# Patient Record
Sex: Male | Born: 1975
Health system: Southern US, Community
[De-identification: ages and names within clinical notes are randomized; demographics above are authoritative.]

## PROBLEM LIST (undated history)

## (undated) DIAGNOSIS — K449 Diaphragmatic hernia without obstruction or gangrene: Secondary | ICD-10-CM

## (undated) DIAGNOSIS — F419 Anxiety disorder, unspecified: Secondary | ICD-10-CM

## (undated) DIAGNOSIS — F329 Major depressive disorder, single episode, unspecified: Secondary | ICD-10-CM

## (undated) DIAGNOSIS — K219 Gastro-esophageal reflux disease without esophagitis: Secondary | ICD-10-CM

## (undated) DIAGNOSIS — D571 Sickle-cell disease without crisis: Secondary | ICD-10-CM

## (undated) DIAGNOSIS — A630 Anogenital (venereal) warts: Secondary | ICD-10-CM

## (undated) DIAGNOSIS — B20 Human immunodeficiency virus [HIV] disease: Secondary | ICD-10-CM

## (undated) DIAGNOSIS — F32A Depression, unspecified: Secondary | ICD-10-CM

## (undated) DIAGNOSIS — K648 Other hemorrhoids: Secondary | ICD-10-CM

## (undated) DIAGNOSIS — K635 Polyp of colon: Secondary | ICD-10-CM

## (undated) DIAGNOSIS — R51 Headache: Secondary | ICD-10-CM

## (undated) DIAGNOSIS — Z21 Asymptomatic human immunodeficiency virus [HIV] infection status: Secondary | ICD-10-CM

## (undated) DIAGNOSIS — M1990 Unspecified osteoarthritis, unspecified site: Secondary | ICD-10-CM

## (undated) DIAGNOSIS — I10 Essential (primary) hypertension: Secondary | ICD-10-CM

## (undated) DIAGNOSIS — D759 Disease of blood and blood-forming organs, unspecified: Secondary | ICD-10-CM

## (undated) DIAGNOSIS — K5900 Constipation, unspecified: Secondary | ICD-10-CM

## (undated) DIAGNOSIS — G8929 Other chronic pain: Secondary | ICD-10-CM

## (undated) DIAGNOSIS — K259 Gastric ulcer, unspecified as acute or chronic, without hemorrhage or perforation: Secondary | ICD-10-CM

## (undated) DIAGNOSIS — M549 Dorsalgia, unspecified: Secondary | ICD-10-CM

## (undated) HISTORY — DX: Anogenital (venereal) warts: A63.0

## (undated) HISTORY — DX: Sickle-cell disease without crisis: D57.1

## (undated) HISTORY — DX: Polyp of colon: K63.5

## (undated) HISTORY — DX: Human immunodeficiency virus (HIV) disease: B20

## (undated) HISTORY — DX: Other hemorrhoids: K64.8

## (undated) HISTORY — DX: Constipation, unspecified: K59.00

## (undated) HISTORY — PX: WISDOM TOOTH EXTRACTION: SHX21

## (undated) HISTORY — DX: Gastro-esophageal reflux disease without esophagitis: K21.9

## (undated) HISTORY — DX: Diaphragmatic hernia without obstruction or gangrene: K44.9

## (undated) HISTORY — DX: Gastric ulcer, unspecified as acute or chronic, without hemorrhage or perforation: K25.9

## (undated) HISTORY — DX: Asymptomatic human immunodeficiency virus (hiv) infection status: Z21

---

## 1997-11-28 ENCOUNTER — Emergency Department (HOSPITAL_COMMUNITY): Admission: EM | Admit: 1997-11-28 | Discharge: 1997-11-28 | Payer: Self-pay | Admitting: Emergency Medicine

## 2002-04-17 ENCOUNTER — Emergency Department (HOSPITAL_COMMUNITY): Admission: EM | Admit: 2002-04-17 | Discharge: 2002-04-17 | Payer: Self-pay | Admitting: Emergency Medicine

## 2002-04-19 ENCOUNTER — Emergency Department (HOSPITAL_COMMUNITY): Admission: EM | Admit: 2002-04-19 | Discharge: 2002-04-19 | Payer: Self-pay | Admitting: Emergency Medicine

## 2002-04-21 ENCOUNTER — Emergency Department (HOSPITAL_COMMUNITY): Admission: EM | Admit: 2002-04-21 | Discharge: 2002-04-21 | Payer: Self-pay | Admitting: Emergency Medicine

## 2002-06-20 ENCOUNTER — Emergency Department (HOSPITAL_COMMUNITY): Admission: EM | Admit: 2002-06-20 | Discharge: 2002-06-20 | Payer: Self-pay | Admitting: Emergency Medicine

## 2003-04-04 ENCOUNTER — Emergency Department (HOSPITAL_COMMUNITY): Admission: EM | Admit: 2003-04-04 | Discharge: 2003-04-04 | Payer: Self-pay | Admitting: Emergency Medicine

## 2004-04-14 ENCOUNTER — Emergency Department (HOSPITAL_COMMUNITY): Admission: EM | Admit: 2004-04-14 | Discharge: 2004-04-14 | Payer: Self-pay | Admitting: Emergency Medicine

## 2004-05-01 ENCOUNTER — Emergency Department (HOSPITAL_COMMUNITY): Admission: EM | Admit: 2004-05-01 | Discharge: 2004-05-01 | Payer: Self-pay | Admitting: Emergency Medicine

## 2004-06-01 ENCOUNTER — Emergency Department (HOSPITAL_COMMUNITY): Admission: EM | Admit: 2004-06-01 | Discharge: 2004-06-01 | Payer: Self-pay | Admitting: Emergency Medicine

## 2004-08-12 ENCOUNTER — Emergency Department (HOSPITAL_COMMUNITY): Admission: EM | Admit: 2004-08-12 | Discharge: 2004-08-12 | Payer: Self-pay | Admitting: Emergency Medicine

## 2004-12-03 ENCOUNTER — Emergency Department (HOSPITAL_COMMUNITY): Admission: EM | Admit: 2004-12-03 | Discharge: 2004-12-03 | Payer: Self-pay | Admitting: Emergency Medicine

## 2005-06-05 ENCOUNTER — Emergency Department (HOSPITAL_COMMUNITY): Admission: EM | Admit: 2005-06-05 | Discharge: 2005-06-05 | Payer: Self-pay | Admitting: Emergency Medicine

## 2005-07-06 ENCOUNTER — Emergency Department (HOSPITAL_COMMUNITY): Admission: EM | Admit: 2005-07-06 | Discharge: 2005-07-06 | Payer: Self-pay | Admitting: Emergency Medicine

## 2005-12-26 ENCOUNTER — Emergency Department (HOSPITAL_COMMUNITY): Admission: EM | Admit: 2005-12-26 | Discharge: 2005-12-26 | Payer: Self-pay | Admitting: Emergency Medicine

## 2006-05-10 ENCOUNTER — Emergency Department (HOSPITAL_COMMUNITY): Admission: EM | Admit: 2006-05-10 | Discharge: 2006-05-10 | Payer: Self-pay | Admitting: Emergency Medicine

## 2006-08-11 ENCOUNTER — Emergency Department (HOSPITAL_COMMUNITY): Admission: EM | Admit: 2006-08-11 | Discharge: 2006-08-12 | Payer: Self-pay | Admitting: Emergency Medicine

## 2007-07-31 ENCOUNTER — Emergency Department (HOSPITAL_COMMUNITY): Admission: EM | Admit: 2007-07-31 | Discharge: 2007-08-01 | Payer: Self-pay | Admitting: Emergency Medicine

## 2007-08-04 ENCOUNTER — Ambulatory Visit: Payer: Self-pay | Admitting: Gastroenterology

## 2007-08-08 ENCOUNTER — Ambulatory Visit: Payer: Self-pay | Admitting: Gastroenterology

## 2007-10-10 ENCOUNTER — Ambulatory Visit: Payer: Self-pay | Admitting: Gastroenterology

## 2007-12-28 ENCOUNTER — Emergency Department (HOSPITAL_COMMUNITY): Admission: EM | Admit: 2007-12-28 | Discharge: 2007-12-28 | Payer: Self-pay | Admitting: Emergency Medicine

## 2007-12-30 ENCOUNTER — Emergency Department (HOSPITAL_COMMUNITY): Admission: EM | Admit: 2007-12-30 | Discharge: 2007-12-30 | Payer: Self-pay | Admitting: Emergency Medicine

## 2008-10-05 ENCOUNTER — Emergency Department (HOSPITAL_COMMUNITY): Admission: EM | Admit: 2008-10-05 | Discharge: 2008-10-06 | Payer: Self-pay | Admitting: Emergency Medicine

## 2010-02-06 ENCOUNTER — Encounter: Payer: Self-pay | Admitting: Adult Health

## 2010-02-06 LAB — CONVERTED CEMR LAB
CD4 Count: 279 microliters
CD4 T Helper %: 14.7 %
Platelets: 154 10*3/uL
WBC: 6.6 10*3/uL

## 2010-04-16 DIAGNOSIS — B20 Human immunodeficiency virus [HIV] disease: Secondary | ICD-10-CM

## 2010-05-04 ENCOUNTER — Encounter (INDEPENDENT_AMBULATORY_CARE_PROVIDER_SITE_OTHER): Payer: Self-pay | Admitting: *Deleted

## 2010-05-06 ENCOUNTER — Encounter: Payer: Self-pay | Admitting: Adult Health

## 2010-05-06 ENCOUNTER — Ambulatory Visit: Payer: Self-pay | Admitting: Internal Medicine

## 2010-05-06 DIAGNOSIS — M76899 Other specified enthesopathies of unspecified lower limb, excluding foot: Secondary | ICD-10-CM | POA: Insufficient documentation

## 2010-05-06 DIAGNOSIS — F341 Dysthymic disorder: Secondary | ICD-10-CM

## 2010-05-06 LAB — CONVERTED CEMR LAB
ALT: 14 units/L (ref 0–53)
AST: 23 units/L (ref 0–37)
Albumin: 4.3 g/dL (ref 3.5–5.2)
Alkaline Phosphatase: 71 units/L (ref 39–117)
BUN: 10 mg/dL (ref 6–23)
Basophils Absolute: 0 10*3/uL (ref 0.0–0.1)
Basophils Relative: 0 % (ref 0–1)
Bilirubin Urine: NEGATIVE
CO2: 25 meq/L (ref 19–32)
Calcium: 9.4 mg/dL (ref 8.4–10.5)
Chlamydia, Swab/Urine, PCR: NEGATIVE
Chloride: 103 meq/L (ref 96–112)
Cholesterol: 169 mg/dL (ref 0–200)
Creatinine, Ser: 1.16 mg/dL (ref 0.40–1.50)
Eosinophils Absolute: 0.1 10*3/uL (ref 0.0–0.7)
Eosinophils Relative: 1 % (ref 0–5)
GC Probe Amp, Urine: NEGATIVE
Glucose, Bld: 74 mg/dL (ref 70–99)
HCT: 43.9 % (ref 39.0–52.0)
HCV Ab: NEGATIVE
HDL: 37 mg/dL — ABNORMAL LOW (ref >39)
HIV 1 RNA Quant: 34900 copies/mL — ABNORMAL HIGH (ref <20)
HIV-1 RNA Quant, Log: 4.54 — ABNORMAL HIGH (ref <1.30)
HIV-1 antibody: POSITIVE — AB
HIV-2 Ab: NEGATIVE
Hemoglobin, Urine: NEGATIVE
Hemoglobin: 14.8 g/dL (ref 13.0–17.0)
Hep A Total Ab: NEGATIVE
Hep B Core Total Ab: NEGATIVE
Hep B S Ab: NEGATIVE
Hepatitis B Surface Ag: NEGATIVE
Ketones, ur: NEGATIVE mg/dL
LDL Cholesterol: 116 mg/dL — ABNORMAL HIGH (ref 0–99)
Leukocytes, UA: NEGATIVE
Lymphocytes Relative: 32 % (ref 12–46)
Lymphs Abs: 2.2 10*3/uL (ref 0.7–4.0)
MCHC: 33.7 g/dL (ref 30.0–36.0)
MCV: 88.7 fL (ref 78.0–100.0)
Monocytes Absolute: 0.7 10*3/uL (ref 0.1–1.0)
Monocytes Relative: 10 % (ref 3–12)
Neutro Abs: 3.9 10*3/uL (ref 1.7–7.7)
Neutrophils Relative %: 57 % (ref 43–77)
Nitrite: NEGATIVE
Platelets: 152 10*3/uL (ref 150–400)
Potassium: 4.1 meq/L (ref 3.5–5.3)
Protein, ur: NEGATIVE mg/dL
RBC: 4.95 M/uL (ref 4.22–5.81)
RDW: 13.7 % (ref 11.5–15.5)
Sodium: 135 meq/L (ref 135–145)
Specific Gravity, Urine: 1.02 (ref 1.005–1.030)
Total Bilirubin: 0.4 mg/dL (ref 0.3–1.2)
Total CHOL/HDL Ratio: 4.6
Total Protein: 9 g/dL — ABNORMAL HIGH (ref 6.0–8.3)
Triglycerides: 82 mg/dL (ref <150)
Urine Glucose: NEGATIVE mg/dL
Urobilinogen, UA: 0.2 (ref 0.0–1.0)
VLDL: 16 mg/dL (ref 0–40)
WBC: 6.9 10*3/uL (ref 4.0–10.5)
pH: 7.5 (ref 5.0–8.0)

## 2010-05-11 ENCOUNTER — Ambulatory Visit: Payer: Self-pay | Admitting: Adult Health

## 2010-05-25 ENCOUNTER — Ambulatory Visit: Payer: Self-pay | Admitting: Adult Health

## 2010-05-25 ENCOUNTER — Encounter (INDEPENDENT_AMBULATORY_CARE_PROVIDER_SITE_OTHER): Payer: Self-pay | Admitting: *Deleted

## 2010-05-26 ENCOUNTER — Encounter (INDEPENDENT_AMBULATORY_CARE_PROVIDER_SITE_OTHER): Payer: Self-pay | Admitting: *Deleted

## 2010-06-01 ENCOUNTER — Encounter (INDEPENDENT_AMBULATORY_CARE_PROVIDER_SITE_OTHER): Payer: Self-pay | Admitting: *Deleted

## 2010-06-02 ENCOUNTER — Encounter (INDEPENDENT_AMBULATORY_CARE_PROVIDER_SITE_OTHER): Payer: Self-pay | Admitting: *Deleted

## 2010-06-02 ENCOUNTER — Ambulatory Visit: Payer: Self-pay | Admitting: Adult Health

## 2010-06-04 ENCOUNTER — Encounter (INDEPENDENT_AMBULATORY_CARE_PROVIDER_SITE_OTHER): Payer: Self-pay | Admitting: *Deleted

## 2010-06-09 ENCOUNTER — Encounter (INDEPENDENT_AMBULATORY_CARE_PROVIDER_SITE_OTHER): Payer: Self-pay | Admitting: *Deleted

## 2010-06-16 ENCOUNTER — Encounter (INDEPENDENT_AMBULATORY_CARE_PROVIDER_SITE_OTHER): Payer: Self-pay | Admitting: *Deleted

## 2010-06-17 ENCOUNTER — Encounter: Payer: Self-pay | Admitting: Adult Health

## 2010-06-25 ENCOUNTER — Encounter: Payer: Self-pay | Admitting: Adult Health

## 2010-07-06 ENCOUNTER — Ambulatory Visit: Admit: 2010-07-06 | Payer: Self-pay | Admitting: Adult Health

## 2010-07-06 ENCOUNTER — Ambulatory Visit
Admission: RE | Admit: 2010-07-06 | Discharge: 2010-07-06 | Payer: Self-pay | Source: Home / Self Care | Attending: Adult Health | Admitting: Adult Health

## 2010-07-06 ENCOUNTER — Encounter: Payer: Self-pay | Admitting: Adult Health

## 2010-07-06 DIAGNOSIS — J069 Acute upper respiratory infection, unspecified: Secondary | ICD-10-CM | POA: Insufficient documentation

## 2010-07-06 DIAGNOSIS — N529 Male erectile dysfunction, unspecified: Secondary | ICD-10-CM | POA: Insufficient documentation

## 2010-07-06 DIAGNOSIS — B370 Candidal stomatitis: Secondary | ICD-10-CM | POA: Insufficient documentation

## 2010-07-06 DIAGNOSIS — B009 Herpesviral infection, unspecified: Secondary | ICD-10-CM | POA: Insufficient documentation

## 2010-07-06 LAB — CONVERTED CEMR LAB
ALT: 18 units/L (ref 0–53)
AST: 30 units/L (ref 0–37)
Albumin: 4.4 g/dL (ref 3.5–5.2)
Alkaline Phosphatase: 75 units/L (ref 39–117)
BUN: 10 mg/dL (ref 6–23)
Basophils Absolute: 0 10*3/uL (ref 0.0–0.1)
Basophils Relative: 0 % (ref 0–1)
CO2: 26 meq/L (ref 19–32)
Calcium: 9.7 mg/dL (ref 8.4–10.5)
Chloride: 102 meq/L (ref 96–112)
Creatinine, Ser: 1.18 mg/dL (ref 0.40–1.50)
Eosinophils Absolute: 0.1 10*3/uL (ref 0.0–0.7)
Eosinophils Relative: 1 % (ref 0–5)
Glucose, Bld: 95 mg/dL (ref 70–99)
HCT: 43.8 % (ref 39.0–52.0)
HIV 1 RNA Quant: 48 copies/mL — ABNORMAL HIGH (ref <20)
HIV-1 RNA Quant, Log: 1.68 — ABNORMAL HIGH (ref <1.30)
Hemoglobin: 14.8 g/dL (ref 13.0–17.0)
Lymphocytes Relative: 31 % (ref 12–46)
Lymphs Abs: 1.8 10*3/uL (ref 0.7–4.0)
MCHC: 33.8 g/dL (ref 30.0–36.0)
MCV: 88.1 fL (ref 78.0–100.0)
Monocytes Absolute: 0.5 10*3/uL (ref 0.1–1.0)
Monocytes Relative: 9 % (ref 3–12)
Neutro Abs: 3.4 10*3/uL (ref 1.7–7.7)
Neutrophils Relative %: 59 % (ref 43–77)
Platelets: 151 10*3/uL (ref 150–400)
Potassium: 3.9 meq/L (ref 3.5–5.3)
RBC: 4.97 M/uL (ref 4.22–5.81)
RDW: 14.2 % (ref 11.5–15.5)
Sodium: 136 meq/L (ref 135–145)
Total Bilirubin: 0.4 mg/dL (ref 0.3–1.2)
Total Protein: 8.8 g/dL — ABNORMAL HIGH (ref 6.0–8.3)
WBC: 5.8 10*3/uL (ref 4.0–10.5)

## 2010-07-07 ENCOUNTER — Encounter (INDEPENDENT_AMBULATORY_CARE_PROVIDER_SITE_OTHER): Payer: Self-pay | Admitting: *Deleted

## 2010-07-13 LAB — T-HELPER CELL (CD4) - (RCID CLINIC ONLY)
CD4 % Helper T Cell: 19 % — ABNORMAL LOW (ref 33–55)
CD4 T Cell Abs: 420 uL (ref 400–2700)

## 2010-07-20 ENCOUNTER — Encounter (INDEPENDENT_AMBULATORY_CARE_PROVIDER_SITE_OTHER): Payer: Self-pay | Admitting: *Deleted

## 2010-07-20 ENCOUNTER — Ambulatory Visit
Admission: RE | Admit: 2010-07-20 | Discharge: 2010-07-20 | Payer: Self-pay | Source: Home / Self Care | Attending: Adult Health | Admitting: Adult Health

## 2010-07-30 ENCOUNTER — Encounter (INDEPENDENT_AMBULATORY_CARE_PROVIDER_SITE_OTHER): Payer: Self-pay | Admitting: *Deleted

## 2010-07-30 NOTE — Assessment & Plan Note (Signed)
Summary: COUGHING/VS   Primary Provider:  [name] NP  CC:  COUGH.  History of Present Illness: 1 week hx of sinus congestion and cough which has since improved with Nyquil and Alka-selzer Cold and Flu, but since recovery has had coated, painful tongue making difficult to chew or taste food.  Also recurrence of "rash" to sacral area of back.  Pruritic and a slightly painful.  Also having "bump" on penis.  Requesting something for ED.   Preventive Screening-Counseling & Management  Alcohol-Tobacco     Alcohol drinks/day: daily      Alcohol type: beer     Smoking Status: current     Packs/Day: 0.5  Safety-Violence-Falls     Seat Belt Use: yes   Current Allergies (reviewed today): No known allergies  Review of Systems       The patient complains of genital sores and suspicious skin lesions.  The patient denies anorexia, fever, weight loss, weight gain, vision loss, decreased hearing, hoarseness, chest pain, syncope, dyspnea on exertion, peripheral edema, prolonged cough, headaches, hemoptysis, abdominal pain, melena, hematochezia, severe indigestion/heartburn, hematuria, incontinence, muscle weakness, transient blindness, difficulty walking, depression, unusual weight change, abnormal bleeding, enlarged lymph nodes, and angioedema.         See HPI.    Vital Signs:  Patient profile:   35 year old male Height:      68 inches (172.72 cm) Weight:      207.0 pounds (94.09 kg) BMI:     31.59 Temp:     98.4 degrees F (36.89 degrees C) oral Pulse rate:   88 / minute BP sitting:   118 / 77  (right arm)  Vitals Entered By: Baxter Hire) (July 06, 2010 3:28 PM) CC: COUGH Pain Assessment Patient in pain? no      Nutritional Status BMI of > 30 = obese Nutritional Status Detail APPETITE IS NORMAL PER PATIENT  Does patient need assistance? Functional Status Self care Ambulation Normal   Physical Exam  General:  Well-developed,well-nourished,in no acute  distress; alert,appropriate and cooperative throughout examination Head:  Normocephalic and atraumatic without obvious abnormalities. No apparent alopecia or balding. Ears:  External ear exam shows no significant lesions or deformities.  Otoscopic examination reveals clear canals, tympanic membranes are intact bilaterally without bulging, retraction, inflammation or discharge. Hearing is grossly normal bilaterally. Nose:  External nasal examination shows no deformity or inflammation. Nasal mucosa are pink and moist without lesions or exudates. Mouth:    white plaque(s) and glossitis.   Neck:  No deformities, masses, or tenderness noted. Lungs:  Normal respiratory effort, chest expands symmetrically. Lungs are clear to auscultation, no crackles or wheezes. Heart:  Normal rate and regular rhythm. S1 and S2 normal without gallop, murmur, click, rub or other extra sounds. Genitalia:  Small umbelicated lesion dorsal surface of penis. Msk:  No deformity or scoliosis noted of thoracic or lumbar spine.   Neurologic:  alert & oriented X3, cranial nerves II-XII intact, strength normal in all extremities, and gait normal.   Skin:  1 cm round grouping of fluid-filled vessicles located at mid sacral region, no extension into S1 or S2 dermatome distribution noted Psych:  Cognition and judgment appear intact. Alert and cooperative with normal attention span and concentration. No apparent delusions, illusions, hallucinations   Impression & Recommendations:  Problem # 1:  CANDIDIASIS, ORAL (ICD-112.0)  fluconazole 100mg  by mouth once daily x 5 days.  Orders: Est. 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 Zoster, but subjectively has had similar intermittently.  Will try Valtrex 1 gm by mouth q12h x 7 days.  If symptoms worsen, to contact clinic.  Orders: Est. Patient Level IV (29562)  Problem # 3:  URI (ICD-465.9)  Resolving spontaneously, most  likely viral.  Recommended Mucinex-DM per package instructions.  To call if symptoms worsen again.  Orders: Est. Patient Level IV (13086)  Problem # 4:  IMPOTENCE OF ORGANIC ORIGIN (ICD-607.84)  Gave trial course of cialis.  Instructed on drug safety and interactions. Informed him ED may be associated with Lexapro therapy.  Informed him of free trial offer provided by company, otherwise, he is financially responsible for obtaining med. His updated medication list for this problem includes:    Cialis 5 Mg Tabs (Tadalafil) .Marland Kitchen... 1 tab by mouth once daily as directed.  Orders: Est. Patient Level IV (57846)  Problem # 5:  HIV INFECTION (ICD-042)  To obtain staging labs today and f/u on 07/21/2010. His updated medication list for this problem includes:    Fluconazole 100 Mg Tabs (Fluconazole) .Marland Kitchen... 1 tab by mouth once daily x 5 days    Valtrex 1 Gm Tabs (Valacyclovir hcl) .Marland Kitchen... 1 tab by mouth every 12 hours for 7 days  Orders: Est. Patient Level IV (96295)  Medications Added to Medication List This Visit: 1)  Fluconazole 100 Mg Tabs (Fluconazole) .Marland Kitchen.. 1 tab by mouth once daily x 5 days 2)  Valtrex 1 Gm Tabs (Valacyclovir hcl) .Marland Kitchen.. 1 tab by mouth every 12 hours for 7 days 3)  Cialis 5 Mg Tabs (Tadalafil) .Marland Kitchen.. 1 tab by mouth once daily as directed. Prescriptions: CIALIS 5 MG TABS (TADALAFIL) 1 tab by mouth once daily as directed.  #30 x 0   Entered and Authorized by:   [name] NP   Signed by:   [name] NP on 07/06/2010   Method used:   Print then Give to Patient   RxID:   [id number] VALTREX 1 GM TABS (VALACYCLOVIR HCL) 1 tab by mouth every 12 hours for 7 days  #14 x 0   Entered and Authorized by:   [name] NP   Signed by:   [name] NP on 07/06/2010   Method used:   Print then Give to Patient   RxID:   [id number] FLUCONAZOLE 100 MG TABS (FLUCONAZOLE) 1 tab by mouth once daily x 5 days  #5 x 0   Entered and Authorized by:    [name] NP   Signed by:   [name] NP on 07/06/2010   Method used:   Print then Give to Patient   RxID:   [id number]

## 2010-07-30 NOTE — Miscellaneous (Signed)
Summary: Lexapro samples received   Clinical Lists Changes  Medications: Rx of LEXAPRO 20 MG TABS (ESCITALOPRAM OXALATE) 1/2 tab by mouth once daily;  #100 x 0;  Signed;  Entered by: Janyth Contes;  Authorized by: [name] NP;  Method used: Samples Given    Prescriptions: LEXAPRO 20 MG TABS (ESCITALOPRAM OXALATE) 1/2 tab by mouth once daily  #100 x 0   Entered by:   Tomasita Morrow RN   Authorized by:   [name] NP   Signed by:   Tomasita Morrow RN on 06/17/2010   Method used:   Samples Given   RxID:   [id number]  Customer by P.O. # [id number] Tomasita Morrow RN  June 17, 2010 5:39 PM

## 2010-07-30 NOTE — Miscellaneous (Signed)
Summary: ADAP Update  Clinical Lists Changes  Observations: Added new observation of AIDSDAP: Yes 2011 (06/04/2010 11:40)

## 2010-07-30 NOTE — Miscellaneous (Signed)
Summary: Bridge Counselor  Clinical Lists Changes BC transported pt to [facility] today for hs scheduled appointments with mental health counselor and Traci Sermon. Pt then met with THP for ongoing case management as pt has other long terms goals that he wishes to pursure. BC will close pt's file.

## 2010-07-30 NOTE — Consult Note (Signed)
Summary: New Pt. Referral : [facility] 02/17/10  New Pt. Referral : [facility] 02/17/10   Imported By: Florinda Marker 06/25/2010 15:57:18  _____________________________________________________________________  External Attachment:    Type:   Image     Comment:   External Document

## 2010-07-30 NOTE — Miscellaneous (Signed)
Summary: HIPAA Restrictions  HIPAA Restrictions   Imported By: Florinda Marker 05/11/2010 14:53:41  _____________________________________________________________________  External Attachment:    Type:   Image     Comment:   External Document

## 2010-07-30 NOTE — Miscellaneous (Signed)
Summary: Bridge Counselor referral  Clinical Lists Changes  Problems: Added new problem of HIV INFECTION (ICD-042) Orders: Added new Referral order of Misc. Referral (Misc. Ref) - Signed

## 2010-07-30 NOTE — Miscellaneous (Signed)
Summary: Bridge Counselor  Clinical Lists Changes BC called the patient assistance program for pt's Lexapro today. Pt was approved on 05/27/10 and his medication should be arriving in the next two weeks. Please be on the lookout for it so that we can let pt know as soon as it is here and available for pick up. Thanks!

## 2010-07-30 NOTE — Assessment & Plan Note (Signed)
Summary: 1wk f/u [mkj]   Primary Shawn Brooks:  Shawn Chad NP  CC:  follow up.  History of Present Illness: Took last 20 mg tab Lexapro today.  Has no refills.  Emotionally stable but not "quite there" yet, according to him.  No physical complaints voiced.  Ready to begin ARV therapy.  Preventive Screening-Counseling & Management  Alcohol-Tobacco     Smoking Status: current   Current Allergies: No known allergies  Review of Systems  The patient denies anorexia, fever, weight loss, weight gain, vision loss, decreased hearing, hoarseness, chest pain, syncope, dyspnea on exertion, peripheral edema, prolonged cough, headaches, hemoptysis, abdominal pain, melena, hematochezia, severe indigestion/heartburn, hematuria, incontinence, genital sores, muscle weakness, suspicious skin lesions, transient blindness, difficulty walking, depression, unusual weight change, abnormal bleeding, enlarged lymph nodes, angioedema, breast masses, and testicular masses.    Vital Signs:  Patient profile:   35 year old male Height:      68 inches Weight:      201.50 pounds BMI:     30.75 Temp:     97.7 degrees F oral Pulse rate:   71 / minute BP sitting:   113 / 73  (left arm) Cuff size:   large  Vitals Entered By: Jimmy Footman, CMA (June 02, 2010 10:03 AM) CC: follow up Is Patient Diabetic? No   Physical Exam  General:  LIMITED EXAMalert, well-developed, well-nourished, well-hydrated, appropriate dress, normal appearance, healthy-appearing, cooperative to examination, and good hygiene.   Mouth:  Oral mucosa and oropharynx without lesions or exudates.  Teeth in good repair. Lungs:  Normal respiratory effort, chest expands symmetrically. Lungs are clear to auscultation, no crackles or wheezes. Heart:  Normal rate and regular rhythm. S1 and S2 normal without gallop, murmur, click, rub or other extra sounds. Abdomen:  Bowel sounds positive,abdomen soft and non-tender without masses, organomegaly  or hernias noted.   Impression & Recommendations:  Problem # 1:  ANXIETY DEPRESSION (ICD-300.4)  Stable.  Referred to CCN for assistance with Lexapro Rx until PAP in place, which should be in the next two weeks.  Per CCN request, we will provide Rx refills at 1-week intervals until PAP is started.  Orders: Est. Patient Level IV (29562)  Problem # 2:  HIV INFECTION (ICD-042)  Spent >20 minutes discussing HIV pathology and treatment regimens.  Discussion of regimens extensive including SE's and long-term toxicities.  We jointly agreed upon Isentress and Truvada for an initial regimen.  He acknowledged how to take medications and verbally described his understanding of treatment SE's.  39-month supply dispensed pending completion of ADAP application process.  He is to return in 4 weeks for labs with follow-up in 5-6 weeks.  Orders: Est. Patient Level IV (99214)Future Orders: T-CBC w/Diff ([id number]) ... 06/30/2010 T-CD4SP ([facility]) (CD4SP) ... 06/30/2010 T-Comprehensive Metabolic Panel [phone]) ... 06/30/2010 T-HIV Viral Load [phone]) ... 06/30/2010  Medications Added to Medication List This Visit: 1)  Isentress 400 Mg Tabs (Raltegravir potassium) .... Take one (1) tablet every twelve (12) hours 2)  Truvada 200-300 Mg Tabs (Emtricitabine-tenofovir) .... Take one (1) tablet once a day  Patient Instructions: 1)  Take medications as prescribed. 2)  Follow-up with Eligibility office for ADAP application 3)  Follow-up with Selena Batten in [facility] for Lexapro Rx. 4)  Return for labs in 4 weeks 5)  Schedule follow-up appointment in 5-6 weeks. Prescriptions: TRUVADA 200-300 MG TABS (EMTRICITABINE-TENOFOVIR) Take one (1) tablet once a day  #30 x 0   Entered and Authorized by:   Malvin Johns  Sundra Aland NP   Signed by:   Shawn Chad NP on 06/02/2010   Method used:   Print then Give to Patient   RxID:   [id number] ISENTRESS 400 MG TABS (RALTEGRAVIR POTASSIUM) Take one (1) tablet every  twelve (12) hours  #60 x 0   Entered and Authorized by:   Shawn Chad NP   Signed by:   Shawn Chad NP on 06/02/2010   Method used:   Print then Give to Patient   RxID:   [id number]

## 2010-07-30 NOTE — Consult Note (Signed)
Summary: Progress Notes: TST  Progress Notes: TST   Imported By: Florinda Marker 06/25/2010 15:54:26  _____________________________________________________________________  External Attachment:    Type:   Image     Comment:   External Document

## 2010-07-30 NOTE — Miscellaneous (Signed)
Summary: RW AND ADAP UPDATES  Clinical Lists Changes  Observations: Added new observation of AIDSDAP: Pending (06/02/2010 13:05) Added new observation of YEARLYEXPEN: 3108  (06/02/2010 13:05)  Appended Document: RW AND ADAP UPDATES PATIENT APPLYING FOR ADAP - SENDING APP TODAY, 06/02/10

## 2010-07-30 NOTE — Assessment & Plan Note (Signed)
Summary: 1WK F/U [MKJ]   CC:  feeling "somewhat better" and Depression.  History of Present Illness: Has been on Lexapro for pas week with some improvement in emotional lability.  Denies any SI or pre-morbid thoughts at present.  Sleep patterns improving.  Denies any known sequela of HIV in past.  Historically been healthy and well upuntil current HIV diagnosis.  No physical complaints voiced presently.  Depression History:      The patient denies a depressed mood most of the day but notes a diminished interest in his usual daily activities.        The patient denies that he feels like life is not worth living, denies that he wishes that he were dead, and denies that he has thought about ending his life.        Preventive Screening-Counseling & Management  Alcohol-Tobacco     Alcohol drinks/day: daily      Alcohol type: beer     Smoking Status: never     Packs/Day: 0.5  Caffeine-Diet-Exercise     Caffeine use/day: none     Does Patient Exercise: no  Hep-HIV-STD-Contraception     HIV Risk: risk noted  Safety-Violence-Falls     Seat Belt Use: yes      Sexual History:  same sex encounters and currently monogomous.        Blood Transfusions:  no.    Past History:  Past Medical History: Unremarkable  Social History: Occupation: unemployed Designer, multimedia Single Never Smoked Alcohol use-yes Drug use-yes Regular exercise-no Education:  Radio producer Situation:  lives with partner Pets:  no Sexually Active:  yes Sexual Orientation:  homosexual Sexual History:  same sex encounters, currently monogomous Blood Transfusions:  no Ethnicity:  Black Occupation:  None Marital Status:  Divorced Religion:  Christian Protestant Alcohol:  1 per day Tobacco Use:  Never  Additional History Condom Use:  occasional Hx of STD:  no Tattoos:  no School Level:  college  Review of Systems General:  Complains of sleep disorder; denies chills, fatigue, fever, loss of appetite,  malaise, sweats, weakness, and weight loss. Eyes:  Denies blurring, discharge, double vision, eye irritation, eye pain, halos, itching, light sensitivity, red eye, vision loss-1 eye, and vision loss-both eyes. ENT:  Denies decreased hearing, difficulty swallowing, ear discharge, earache, hoarseness, nasal congestion, nosebleeds, postnasal drainage, ringing in ears, sinus pressure, and sore throat. CV:  Denies bluish discoloration of lips or nails, chest pain or discomfort, difficulty breathing at night, difficulty breathing while lying down, fainting, fatigue, leg cramps with exertion, lightheadness, near fainting, palpitations, shortness of breath with exertion, swelling of feet, swelling of hands, and weight gain. Resp:  Denies chest discomfort, chest pain with inspiration, cough, coughing up blood, excessive snoring, hypersomnolence, morning headaches, pleuritic, shortness of breath, sputum productive, and wheezing. GI:  Denies abdominal pain, bloody stools, change in bowel habits, constipation, dark tarry stools, diarrhea, excessive appetite, gas, hemorrhoids, indigestion, loss of appetite, nausea, vomiting, vomiting blood, and yellowish skin color. GU:  Denies decreased libido, discharge, dysuria, erectile dysfunction, genital sores, hematuria, incontinence, nocturia, urinary frequency, and urinary hesitancy. MS:  Denies joint pain, joint redness, joint swelling, loss of strength, low back pain, mid back pain, muscle aches, muscle , cramps, muscle weakness, stiffness, and thoracic pain. Derm:  Denies changes in color of skin, changes in nail beds, dryness, excessive perspiration, flushing, hair loss, insect bite(s), itching, lesion(s), poor wound healing, and rash. Neuro:  Denies brief paralysis, difficulty with concentration, disturbances in coordination, falling down,  headaches, inability to speak, memory loss, numbness, poor balance, seizures, sensation of room spinning, tingling, tremors, visual  disturbances, and weakness. Psych:  Complains of anxiety, depression, and irritability; denies alternate hallucination ( auditory/visual), easily angered, easily tearful, mental problems, panic attacks, sense of great danger, suicidal thoughts/plans, thoughts of violence, unusual visions or sounds, and thoughts /plans of harming others. Endo:  Denies cold intolerance, excessive hunger, excessive thirst, excessive urination, heat intolerance, polyuria, and weight change. Heme:  Denies abnormal bruising, bleeding, enlarge lymph nodes, fevers, pallor, and skin discoloration. Allergy:  Denies hives or rash, itching eyes, persistent infections, seasonal allergies, and sneezing.  Vital Signs:  Patient profile:   35 year old male Height:      69 inches Weight:      200.12 pounds BMI:     29.66 BSA:     2.07 Temp:     98.5 degrees F oral Pulse rate:   65 / minute BP sitting:   116 / 75  (right arm)  Vitals Entered By: Starleen Arms CMA (May 25, 2010 9:29 AM) CC: feeling "somewhat better", Depression Is Patient Diabetic? No Pain Assessment Patient in pain? no      Nutritional Status BMI of 25 - 29 = overweight Nutritional Status Detail normal  Have you ever been in a relationship where you felt threatened, hurt or afraid?No  Domestic Violence Intervention none  Does patient need assistance? Functional Status Self care Ambulation Normal   Physical Exam  General:  Well-developed,well-nourished,in no acute distress; alert,appropriate and cooperative throughout examination Head:  normocephalic, atraumatic, no abnormalities observed, no abnormalities palpated, and diffuse alopecia.  normocephalic, atraumatic, no abnormalities observed, and no abnormalities palpated.   Eyes:  No corneal or conjunctival inflammation noted. EOMI. Perrla. Funduscopic exam benign, without hemorrhages, exudates or papilledema. Vision grossly normal. Ears:  External ear exam shows no significant lesions  or deformities.  Otoscopic examination reveals clear canals, tympanic membranes are intact bilaterally without bulging, retraction, inflammation or discharge. Hearing is grossly normal bilaterally. Nose:  External nasal examination shows no deformity or inflammation. Nasal mucosa are pink and moist without lesions or exudates. Mouth:  Oral mucosa and oropharynx without lesions or exudates.  Teeth in good repair. Neck:  No deformities, masses, or tenderness noted. Chest Wall:  No deformities, masses, tenderness or gynecomastia noted. Lungs:  Normal respiratory effort, chest expands symmetrically. Lungs are clear to auscultation, no crackles or wheezes. Heart:  Normal rate and regular rhythm. S1 and S2 normal without gallop, murmur, click, rub or other extra sounds. Abdomen:  Bowel sounds positive,abdomen soft and non-tender without masses, organomegaly or hernias noted. Rectal:  No external abnormalities noted. Normal sphincter tone. No rectal masses or tenderness. Genitalia:  Testes bilaterally descended without nodularity, tenderness or masses. No scrotal masses or lesions. No penis lesions or urethral discharge. Prostate:  deferred Msk:  No deformity or scoliosis noted of thoracic or lumbar spine.   Pulses:  R and L carotid,radial,femoral,dorsalis pedis and posterior tibial pulses are full and equal bilaterally Extremities:  No clubbing, cyanosis, edema, or deformity noted with normal full range of motion of all joints.   Neurologic:  No cranial nerve deficits noted. Station and gait are normal. Plantar reflexes are down-going bilaterally. DTRs are symmetrical throughout. Sensory, motor and coordinative functions appear intact. Skin:  Intact without suspicious lesions or rashes Cervical Nodes:  Shoddy A&P LN's Axillary Nodes:  Shoddy sm LN's Inguinal Nodes:  Shoddy LN's Psych:  Oriented X3, memory intact for recent and remote, normally  interactive, not anxious appearing, not agitated, flat  affect, and poor eye contact.  Oriented X3, memory intact for recent and remote, normally interactive, not anxious appearing, not agitated, flat affect, and poor eye contact.     Impression & Recommendations:  Problem # 1:  HIV INFECTION (ICD-042)  CD4 250 @ 13% with HIV RNA @ 34,900 copies/ml per PCR.  Satisfies CDC surveillance definition of AIDS with CD4% <14%.  Treatment warranted but we would like to see better adjustment with current therapy for depression.  We will defer treatment discussion until a 1-week follow-up to assess readiness at that time.  He acknowledged and agreed with this plan.  He is to go to Eligibility office and begin RW and ADAP application process.  Orders: Est. Patient Level IV (78469)  Problem # 2:  ANXIETY DEPRESSION (ICD-300.4)  Some improvement on 10 mg Lexapro.  Will increase dose to standard 20mg  today and check response in 1 week.  Orders: Est. Patient Level IV (62952)  Patient Instructions: 1)  Schedule follow-up appointment in 1 week. 2)  Increase Lexapro to 1 20 mg tablet by mouth daily.

## 2010-07-30 NOTE — Miscellaneous (Signed)
Summary: Bridge Counselor  Clinical Lists Changes BC transorted pt to [facility] today to see Alisa. Pt in a really good mood today! Pt reports no missed doses of his HIV medications and he has his ADAP approval letter already. BC also called the PAP for pt's Lexapro and was told that it was shipped today. BC assisted pt with another weeks supply of his Lexapro until his 90 day supply comes in. Please call pt once his medications arrive at the clinic so he can pick them up!

## 2010-07-30 NOTE — Miscellaneous (Signed)
Summary: Bridge Counselor  Clinical Lists Changes Pt's medications from PAP arrived today. Pt now has a 100 day supply of his Lexapro 20mg . BC will deliver pt's medication today. Pt is aware of how and when to take them and will call the clinic with any questions or concerns.

## 2010-07-30 NOTE — Miscellaneous (Signed)
Summary: Bridge Counselor  Clinical Lists Changes  05/26/10-BC has been working with client since 04/22/10.  BC has transported client and had THP to assist client with getting Lexapro (RW funds) due to some depression about dx and other life situations such as unemployment, lack of transportation, father's death, living situtaiton (live with partner), etc.  Client appeared to be in good spirits during transport to his scheduled medical appt 11/28.  Client was suppose to meet with Denyce Robert, Family Service counselor 11/28 but rescheduled for 12/06 @ 11. Client has a doctor's appt with Traci Sermon, NP 12/06 @ 10:00.  Client received his RW card from [facility].  Client will get his orange card and ADAP complete once he brings in a notarized letter of support from his partner as it relates to his living arrangements and proof of unemployment before July 2011(client will bring these requested documents in 12/06).  Client will work with Sharol Roussel, Paramedic after current [employer] leaves [employer]. Client is aware to contact Sharol Roussel after 05/27/10.

## 2010-07-30 NOTE — Assessment & Plan Note (Signed)
Summary: 6wk f/u [mkj]   Primary Provider:  [name] NP  CC:  follow-up visit, labs, and Depression.  History of Present Illness: Depression relapsed over past two weeks with increasing periods of crying, low energy, despondancy, anhedonia, anger.  Episodes when he feels he wish he was dead, has thought of hurting self, but now he does not feel this is an option.  Recent birthday no one from his immediate family called him which increased his feelings of isolation and anger more.  Sleep patterns disturbed: always tired, not sleeping well.  Expressed concerns that his Lexapro is no longer working.  Takes med at bedtime.  Admits adherence to other meds without missed doses.  Depression History:      Positive alarm features for depression include impaired concentration (indecisiveness).        Suicide risk questions reveal that he wishes that he were dead and he has thought about ending his life.  The patient denies that he feels like life is not worth living and denies that he has planned how to end his life.  Due to his current symptoms, it often takes extra effort to do the things he needs to do.        Comments:  doesnt feel like lexapro is working.   Preventive Screening-Counseling & Management  Alcohol-Tobacco     Alcohol drinks/day: <1     Alcohol type: beer     Smoking Status: current     Packs/Day: 0.5  Caffeine-Diet-Exercise     Caffeine use/day: none     Does Patient Exercise: no   Current Allergies (reviewed today): No known allergies  Review of Systems General:  Complains of malaise and sleep disorder; denies chills, fatigue, fever, loss of appetite, sweats, weakness, and weight loss. Eyes:  Denies blurring, double vision, and eye pain. ENT:  Denies decreased hearing, difficulty swallowing, ear discharge, earache, postnasal drainage, ringing in ears, and sinus pressure. CV:  Denies chest pain or discomfort, difficulty breathing at night, difficulty breathing  while lying down, fainting, fatigue, leg cramps with exertion, and lightheadness. Resp:  Complains of sputum productive; denies chest pain with inspiration, cough, coughing up blood, hypersomnolence, and shortness of breath. MS:  Denies joint pain, joint redness, joint swelling, and loss of strength. Derm:  Denies changes in color of skin, changes in nail beds, dryness, excessive perspiration, flushing, hair loss, insect bite(s), itching, lesion(s), poor wound healing, and rash. Neuro:  Complains of difficulty with concentration; denies disturbances in coordination, falling down, headaches, inability to speak, memory loss, numbness, poor balance, seizures, sensation of room spinning, tingling, tremors, and visual disturbances. Psych:  Complains of anxiety, depression, easily angered, easily tearful, irritability, and suicidal thoughts/plans; denies alternate hallucination ( auditory/visual), panic attacks, sense of great danger, thoughts of violence, unusual visions or sounds, and thoughts /plans of harming others.  Vital Signs:  Patient profile:   35 year old male Height:      68 inches Weight:      204.75 pounds BMI:     31.24 Temp:     97.6 degrees F oral Pulse rate:   69 / minute BP sitting:   143 / 81  (right arm)  Vitals Entered By: Alesia Morin CMA (July 20, 2010 10:38 AM) CC: follow-up visit, labs, Depression Is Patient Diabetic? No Pain Assessment Patient in pain? no      Nutritional Status BMI of > 30 = obese Nutritional Status Detail appetite "good"  Have you ever been in a relationship  where you felt threatened, hurt or afraid?No   Does patient need assistance? Functional Status Self care Ambulation Normal   Physical Exam  General:  alert, well-developed, and well-nourished.   Head:  normocephalic and atraumatic.   Eyes:  vision grossly intact, pupils equal, pupils round, and pupils reactive to light.   Ears:  R ear normal and L ear normal.   Nose:  no external  deformity, no external erythema, and no nasal discharge.   Mouth:  no gingival abnormalities, no dental plaque, pharynx pink and moist, and fair dentition.   Neck:  No deformities, masses, or tenderness noted. Lungs:  Normal respiratory effort, chest expands symmetrically. Lungs are clear to auscultation, no crackles or wheezes. Heart:  Normal rate and regular rhythm. S1 and S2 normal without gallop, murmur, click, rub or other extra sounds. Abdomen:  Bowel sounds positive,abdomen soft and non-tender without masses, organomegaly or hernias noted. Msk:  No deformity or scoliosis noted of thoracic or lumbar spine.   Extremities:  No clubbing, cyanosis, edema, or deformity noted with normal full range of motion of all joints.   Neurologic:  No cranial nerve deficits noted. Station and gait are normal.  Sensory, motor and coordinative functions appear intact. Skin:  Sacral HSV lesions clear Cervical Nodes:  Shoddy A&P LN's Axillary Nodes:  Shoddy sm LN's Psych:  Oriented X3, memory intact for recent and remote, normally interactive, subdued, poor eye contact, and angry.     Impression & Recommendations:  Problem # 1:  HIV INFECTION (ICD-042)  CD4 on 07/06/2010 420 @ 19%  which is markedly up from nadir of 250 @ 13% on 05/06/2010.  HIV RNA 48 copies/ml down nearly log 3 from baseline of 34,900.  Good response after 1 month treatment on Isentress and Truvada.  Will continue present management and repeat labs in 6 weeks with a follow-up in 8 weeks.  If stability persists after this we will extend evaluations to every 3 months thereafter.  Orders: Est. Patient Level IV (99214)Future Orders: T-CBC w/Diff ([id number]) ... 08/31/2010 T-CD4SP ([facility]) (CD4SP) ... 08/31/2010 T-Comprehensive Metabolic Panel [phone]) ... 08/31/2010 T-HIV Viral Load [phone]) ... 08/31/2010 T-Lipid Profile [phone]) ... 08/31/2010  Problem # 2:  ANXIETY DEPRESSION (ICD-300.4)  Relapse may very well be  situational as symptoms re-developed during the time of his birthday.  Although he has re-entertained thoughts of harming hinmself a discussion with Mental Health counselor confirmed he is currently not a danger to himself or others. While this relapse seems to stem from family issues, it is relative to a timeline of life events.  After spending >20 minutes discussing his encouraging lab repoirts and the significant strides he has made, his mood and disposition appeared more positive.  We emphasized the cause and effect benefit of HIV treatment and how he has improved as a measure to have better outlook.  We offered a referral to psychiatry if he felt current regimen is not adequately controlling mood. We opted to continued mental health counseling and monitoring of symptoms.  Agreed to contact crisis numbers should he again feel like harming himself.  As he is having sleep pattern disturbances, we recommend him taki ng his Lexapro in the morning, instead of at bedtime and monitor his response.  We will further monitor this matter in concert with Mental Health Svcs.with whom he has a f/u in 1 week.  Orders: Est. Patient Level IV (16010)  Problem # 3:  HSV (ICD-054.9) Assessment: Improved     Prevention &  Chronic Care Immunizations   Influenza vaccine: Historical  (02/06/2010)    Tetanus booster: 02/06/2010: Historical    Pneumococcal vaccine: Historical  (02/06/2010)  Other Screening   Smoking status: current  (07/20/2010)  Lipids   Total Cholesterol: 169  (05/06/2010)   LDL: 116  (05/06/2010)   LDL Direct: Not documented   HDL: 37  (05/06/2010)   Triglycerides: 82  (05/06/2010)   Immunization History:  Influenza Immunization History:    Influenza:  historical (02/06/2010)  Not Administered:    Influenza Vaccine not given

## 2010-07-30 NOTE — Miscellaneous (Signed)
Summary: Bridge Counselor  Clinical Lists Changes BC transported pt to [facility] today to obtain his flucanozole, valtrex and cialis. BC discussed with pt how to take each medication. Pt expressed understanding and will call the clinic if he experiences any problems.

## 2010-07-30 NOTE — Miscellaneous (Signed)
Summary: Bridge Counselor  Clinical Lists Changes BC (through [facility] funds) was able to assist pt with a 7 day supply of Lexapro. BC will monitor pt's need on a weekly basis for assistance until his patient assistance medications arrive. BC also provided pt with a pillbox and treatment adherence for his HIV medications and watched pt fill his pillbox while still in the clinic. Pt is doing better today reporting "more good days than bad." BC will continue to work with pt through his next follow up appointment in six weeks to make sure that he has ongoing access to all of his medications.

## 2010-07-30 NOTE — Miscellaneous (Signed)
Summary: RW Financial Update   Clinical Lists Changes  Observations: Added new observation of RWPARTICIP: Yes (05/25/2010 12:08) Added new observation of RWTITLE: B (05/25/2010 12:08) Added new observation of AIDSDAP: No (05/25/2010 12:08) Added new observation of PCTFPL: 67.70  (05/25/2010 12:08) Added new observation of INCOMESOURCE: unemployment  (05/25/2010 12:08) Added new observation of HOUSEINCOME: 7332  (05/25/2010 12:08) Added new observation of #CHILD<18 IN: 0  (05/25/2010 12:08) Added new observation of FAMILYSIZE: 1  (05/25/2010 12:08) Added new observation of HOUSING: Stable/permanent  (05/25/2010 12:08) Added new observation of FINASSESSDT: 05/25/2010  (05/25/2010 12:08) Added new observation of YEARLYEXPEN: 0  (05/25/2010 12:08)

## 2010-07-30 NOTE — Assessment & Plan Note (Signed)
Summary: new patient/ depression/tkk   Vital Signs:  Patient profile:   35 year old male Height:      68 inches (172.72 cm) Weight:      199.75 pounds (90.80 kg) BMI:     30.48 Temp:     98.7 degrees F (37.06 degrees C) oral Pulse rate:   64 / minute BP sitting:   116 / 76  (left arm)  Vitals Entered By: Starleen Arms CMA (May 11, 2010 10:33 AM)  CC: new patient f/u labs Is Patient Diabetic? No Pain Assessment Patient in pain? no      Nutritional Status BMI of > 30 = obese  Does patient need assistance? Functional Status Self care Ambulation Normal   Primary Provider:  [name] NP  CC:  new patient f/u labs.  History of Present Illness: 35 y/o African-American male with h/o newly diagnosed HIV presents today for an acute care evaluation due to severe depression and suspected SI.  He relates he has had multiple significant life events occur in the past 2 years and his HIV diagnosis was a precipitating event which caused spiralling depression.  He claims he does not want to be a "burden" on any one, and does not think life is worth living.  He denies any plans to harm himself or others, but has had thoughts of what he might do if he "had a gun" (claims he does not own a gun).  c/o racing thoughts, and poor concentration.  Denied any auditory or visual hallucinations. Having poor sleep patterns at night.  Appetite variable.  Awaiting results of HIV staging labs, but is aware that not all results are available today.  Preventive Screening-Counseling & Management  Alcohol-Tobacco     Alcohol drinks/day: daily      Smoking Status: current     Packs/Day: 0.5  Caffeine-Diet-Exercise     Caffeine use/day: 1/2 serving of tea     Does Patient Exercise: no  Allergies (verified): No Known Drug Allergies  Social History: Occupation: unemployed Media planner Current Smoker 1 ppd Alcohol use-yes Regular exercise-no  Review of Systems General:  Complains  of loss of appetite and sleep disorder. GU:  Complains of decreased libido and erectile dysfunction. Psych:  See HPI; Complains of anxiety, depression, easily tearful, irritability, mental problems, panic attacks, and suicidal thoughts/plans; denies alternate hallucination ( auditory/visual), easily angered, sense of great danger, thoughts of violence, unusual visions or sounds, and thoughts /plans of harming others.  Physical Exam  General:  LIMITED EXAMalert, well-developed, well-nourished, well-hydrated, appropriate dress, normal appearance, healthy-appearing, cooperative to examination, and good hygiene.   Head:  Normocephalic and atraumatic without obvious abnormalities. No apparent alopecia or balding. Psych:  Oriented X3, memory intact for recent and remote, depressed affect, poor eye contact, labile affect, moderately anxious, poor concentration, and judgment poor.     Impression & Recommendations:  Problem # 1:  ANXIETY DEPRESSION (ICD-300.4) Spent >20 minutes discussing symptoms and concerns of his well being.  He did not appear to have SI or HI at the time, He has agreed to a trial course of antidepressant therapy with the understanding he will return in 1 week for continued evaluation and counselling with MSW. Rx written for Lexapro and referral to [facility] made.  Scheduled RTC in 1 week. Orders: New Patient Level IV (47425)  Problem # 2:  HIV INFECTION (ICD-042) Currently pending staging labs Orders: New Patient Level IV (95638)  Medications Added to Medication List This Visit: 1)  Lexapro  20 Mg Tabs (Escitalopram oxalate) .Marland Kitchen.. 1 tablet by mouth once daily 2)  Lexapro 20 Mg Tabs (Escitalopram oxalate) .... 1/2 tab by mouth once daily Prescriptions: LEXAPRO 20 MG TABS (ESCITALOPRAM OXALATE) 1/2 tab by mouth once daily  #15 x 0   Entered by:   Starleen Arms CMA   Authorized by:   [name] NP   Signed by:   Starleen Arms CMA on 05/12/2010   Method used:    Electronically to        [facility] [id number]* (retail)       [address]       [place], [place]  [address]       Ph: [phone]       Fax: [phone]   RxID:   [id number] LEXAPRO 20 MG TABS (ESCITALOPRAM OXALATE) 1 tablet by mouth once daily  #100 x 0   Entered and Authorized by:   [name] NP   Signed by:   [name] NP on 05/11/2010   Method used:   Print then Give to Patient   RxID:   [id number] LEXAPRO 20 MG TABS (ESCITALOPRAM OXALATE) 1/2 tab by mouth once daily  #15 x 0   Entered and Authorized by:   [name] NP   Signed by:   [name] NP on 05/11/2010   Method used:   Print then Give to Patient   RxID:   [id number]    Orders Added: 1)  New Patient Level IV [[id number]]

## 2010-07-30 NOTE — Miscellaneous (Signed)
Summary: Bridge Counseling  Clinical Lists Changes  lient connected to [employer] 04/17/10.

## 2010-07-30 NOTE — Assessment & Plan Note (Signed)
Summary: Nurse Visit (Infectious Disease)   Infectious Disease New Patient Intake Referring MD/Agency: [facility] Dept Address: [address] [place], [place] [address]  Return Appointment Date: 05/11/2010 Medical Records: Received Health Insurance / Payor: No Insurance Employer: none      Do you have a Primary physician: No Are family members aware of patient's diagnosis?  If so, are they supportive? 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  Behavioral Health Assessment Have you ever been diagnosed with depression or mental illness? No  Diagnosis: Never diagnosed Do you drink alcohol? Yes Frequency: daily  Alcohol Beverage Type(s): beer  Do you use recreational drugs? Yes Last Date of Consumption: 05/04/2010 Frequency: daily  Drugs: marijuana - at least 2 joints daily  Do you feel you have a problem with drugs and/or alcohol? No   Have you ever been in a treatment facility for any addiction? No If you are currently using drugs and/or alcohol, would you like help for this addiction? No Behavioral Health Comments: Pt presented today with severe depression. He stated" He would not care if he dropped dead today". He denies suicidal ideation but says he has thought about it at some point of time but no plans for trying.  He says life is not worth living. The Mental Health Therapist was called into the interview to speak with the patient after several minutes of negotiating with the patient to at least speak with her to see if she could help. I did not feel comfortable with releasing patient w/o some form of mental health/SI eval.  Serina Cowper Branch spoke with patient and felt it  would be fine for him to go home. Pt was scheduled for consultation with Alisa on Monday, May 11, 2010 @ 9am.  After this appt. the patient will then see the NP for an acute visit related to his depression and anxiety.   He was somewhat depressed prior to his diagnosis due to lack of employment.  This new information has only increased his anxiety and depression and he is willing to seek help at this point.   HIV Intake Information When did you first test positive for HIV? 01/16/2010 Type of test Conducted: WB   Where was this test performed?  Name of Agency: [facility]  City/State: [place] , [place]  Was this your first time ever being tested or HIV? No Risk Factor(s) for HIV: MSM  Method of Exposure to HIV: Homosexual Intercourse-Receptive Homosexual Intercourse-Insertive Have you ever been hospitalized for any HIV-related condition? No   Newly Diagnosed Patients Has a Disease Intervention Specialist from the Health Department contacted the patient? Yes.   The patient has been informed that the [facility] Department will contact ALL newly reported cases. Health Department Contact:  [phone]   (SSN is needed for confirmation)  Reported Today: 05/07/2010 Health Department Contact:  [phone]            (SSN is needed for confirmation)  Person Reporting: prev.reported Do you have any Non-HIV related medical conditions or other prior hospitalizations or surgeries? No  HIV Medications Information The patient is currently NOT taking any HIV medications.  Infection History  Patient has been diagnosed with the  following opportunistic infections: Are there any other symptoms you need to discuss? Yes Have you received literature/education prior to this visit about HIV/AIDS? No Do you understand the meaning of a Viral Load? No Do you understand the meaning of a CD4 count? No Lab Values Education/Handout Given Yes Medication Education/Handout Given Yes  Sexual History Are  you in a current relationship? Yes How long have you been in this relationship? 5 Are they aware of your diagnosis? Yes Have they been tested for HIV? Yes What were the results: Positive Are you currently sexually active? Yes If no, when was your last encounter? 10/11 Was this protected intercourse? No Safe Sex Counseling/Pamphlet Given Sexual History Comments: last 56 months 4 male partners (unprotected) last year 46 male partners unprotected Lifetime:  150 females,  150 male   Pt states they were mostly people he met at random and there was low use of condoms  Evaluation and Follow-Up INTAKE CHECK LIST: HIV Education, Safe Sex Counseling, HIV Material Given  Prevention For Positives: 05/06/2010   Safe sex practices discussed with patient. Condoms offered. Are you in need of condoms at this time? Yes Our patient has been informed that condoms are always available in this clinic.   Name of Agency: [facility]  [place]  Does patient have problems that warrant Social Worker referral? No  Immunization History:  Tetanus/Td Immunization History:    Tetanus/Td:  historical (02/06/2010)  Pneumovax Immunization History:    Pneumovax:  historical (02/06/2010)  Hepatitis A Immunization History:    Hepatitis A # 1:  historical (02/06/2010)  PPD Results    Date of reading: 02/09/2010    Results: < 5mm    Interpretation: negative        Medication Adherence: 05/06/2010   Adherence to medications reviewed with patient. Counseling to provide adequate adherence provided    Prevention For Positives: 05/06/2010   Safe sex practices discussed with patient. Condoms offered.        05/06/2010   Patient was screened for substance abuse and depression. Referal was made as indicated.                      -  Date:  02/06/2010    CD4: 279    CD4%: 14.7    WBC: 6.6    Platelets: 154

## 2010-08-05 NOTE — Miscellaneous (Signed)
Summary: ADAP Reauthorization  Clinical Lists Changes BC completed pt's ADAP reauthorization. BC passed pt's application along with all supporting documents to [facility] for submission.

## 2010-08-10 ENCOUNTER — Encounter (INDEPENDENT_AMBULATORY_CARE_PROVIDER_SITE_OTHER): Payer: Self-pay | Admitting: *Deleted

## 2010-08-13 ENCOUNTER — Inpatient Hospital Stay (INDEPENDENT_AMBULATORY_CARE_PROVIDER_SITE_OTHER)
Admission: RE | Admit: 2010-08-13 | Discharge: 2010-08-13 | Disposition: A | Discharge status: Home / Self Care | Payer: Self-pay | Source: Ambulatory Visit | Attending: Family Medicine | Admitting: Family Medicine

## 2010-08-13 DIAGNOSIS — H209 Unspecified iridocyclitis: Secondary | ICD-10-CM

## 2010-08-14 ENCOUNTER — Encounter (INDEPENDENT_AMBULATORY_CARE_PROVIDER_SITE_OTHER): Payer: Self-pay | Admitting: *Deleted

## 2010-08-19 NOTE — Miscellaneous (Signed)
Summary: Bridge Counselor  Clinical Lists Changes BC spoke with pt yesterday and pt has met with Amy at [facility] for an intake. BC closed pt's file today for bridge counseling.

## 2010-08-19 NOTE — Miscellaneous (Signed)
Summary: ADAP approved til 03/28/11  Clinical Lists Changes  Observations: Added new observation of AIDSDAP: Yes 2012 (08/10/2010 13:23)

## 2010-08-20 ENCOUNTER — Encounter: Payer: Self-pay | Admitting: Adult Health

## 2010-08-20 ENCOUNTER — Other Ambulatory Visit: Payer: Self-pay | Admitting: Adult Health

## 2010-08-20 ENCOUNTER — Other Ambulatory Visit (INDEPENDENT_AMBULATORY_CARE_PROVIDER_SITE_OTHER): Payer: Self-pay

## 2010-08-20 DIAGNOSIS — B20 Human immunodeficiency virus [HIV] disease: Secondary | ICD-10-CM

## 2010-08-20 LAB — CONVERTED CEMR LAB
ALT: 15 units/L (ref 0–53)
AST: 19 units/L (ref 0–37)
Albumin: 4.2 g/dL (ref 3.5–5.2)
Alkaline Phosphatase: 84 units/L (ref 39–117)
BUN: 12 mg/dL (ref 6–23)
Basophils Absolute: 0 10*3/uL (ref 0.0–0.1)
Basophils Relative: 0 % (ref 0–1)
CO2: 25 meq/L (ref 19–32)
Calcium: 9.5 mg/dL (ref 8.4–10.5)
Chloride: 103 meq/L (ref 96–112)
Cholesterol: 203 mg/dL — ABNORMAL HIGH (ref 0–200)
Creatinine, Ser: 1.25 mg/dL (ref 0.40–1.50)
Eosinophils Absolute: 0.1 10*3/uL (ref 0.0–0.7)
Eosinophils Relative: 1 % (ref 0–5)
Glucose, Bld: 77 mg/dL (ref 70–99)
HCT: 47.3 % (ref 39.0–52.0)
HDL: 46 mg/dL (ref >39)
HIV 1 RNA Quant: <20 copies/mL (ref <20)
HIV-1 RNA Quant, Log: <1.3 (ref <1.30)
Hemoglobin: 16.3 g/dL (ref 13.0–17.0)
LDL Cholesterol: 145 mg/dL — ABNORMAL HIGH (ref 0–99)
Lymphocytes Relative: 36 % (ref 12–46)
Lymphs Abs: 2.6 10*3/uL (ref 0.7–4.0)
MCHC: 34.5 g/dL (ref 30.0–36.0)
MCV: 91 fL (ref 78.0–100.0)
Monocytes Absolute: 0.5 10*3/uL (ref 0.1–1.0)
Monocytes Relative: 7 % (ref 3–12)
Neutro Abs: 4 10*3/uL (ref 1.7–7.7)
Neutrophils Relative %: 56 % (ref 43–77)
Platelets: 173 10*3/uL (ref 150–400)
Potassium: 4 meq/L (ref 3.5–5.3)
RBC: 5.2 M/uL (ref 4.22–5.81)
RDW: 14.7 % (ref 11.5–15.5)
Sodium: 135 meq/L (ref 135–145)
Total Bilirubin: 0.4 mg/dL (ref 0.3–1.2)
Total CHOL/HDL Ratio: 4.4
Total Protein: 8.2 g/dL (ref 6.0–8.3)
Triglycerides: 62 mg/dL (ref <150)
VLDL: 12 mg/dL (ref 0–40)
WBC: 7.1 10*3/uL (ref 4.0–10.5)

## 2010-08-21 LAB — T-HELPER CELL (CD4) - (RCID CLINIC ONLY)
CD4 % Helper T Cell: 20 % — ABNORMAL LOW (ref 33–55)
CD4 T Cell Abs: 550 uL (ref 400–2700)

## 2010-09-03 ENCOUNTER — Encounter: Payer: Self-pay | Admitting: Adult Health

## 2010-09-03 ENCOUNTER — Ambulatory Visit (INDEPENDENT_AMBULATORY_CARE_PROVIDER_SITE_OTHER): Payer: Self-pay | Admitting: Adult Health

## 2010-09-03 DIAGNOSIS — B20 Human immunodeficiency virus [HIV] disease: Secondary | ICD-10-CM

## 2010-09-03 DIAGNOSIS — E785 Hyperlipidemia, unspecified: Secondary | ICD-10-CM

## 2010-09-03 DIAGNOSIS — H209 Unspecified iridocyclitis: Secondary | ICD-10-CM

## 2010-09-08 LAB — T-HELPER CELL (CD4) - (RCID CLINIC ONLY)
CD4 % Helper T Cell: 13 % — ABNORMAL LOW (ref 33–55)
CD4 T Cell Abs: 250 uL — ABNORMAL LOW (ref 400–2700)

## 2010-09-08 NOTE — Assessment & Plan Note (Signed)
Summary: 6wk f/u   Vital Signs:  Patient profile:   35 year old male Height:      68 inches Weight:      206.12 pounds BMI:     31.45 Temp:     98.1 degrees F oral Pulse rate:   68 / minute BP sitting:   110 / 65  (right arm)  Vitals Entered By: Alesia Morin CMA (September 03, 2010 11:36 AM) CC: follow-up visit for labs Is Patient Diabetic? No Pain Assessment Patient in pain? no      Nutritional Status BMI of > 30 = obese Nutritional Status Detail appetite "good"  Have you ever been in a relationship where you felt threatened, hurt or afraid?No   Does patient need assistance? Functional Status Self care Ambulation Normal   Primary Provider:  [name] NP  CC:  follow-up visit for labs.  History of Present Illness: Doing essentially well.  Adherent with meds with good tolerance.  Being treated for iritis to OD, but thinks he "scrathed" OS when applying eye drops as he feels sensation of "something in my eye"  and irritation.  No visual changes noted.  Preventive Screening-Counseling & Management  Alcohol-Tobacco     Alcohol drinks/day: <1     Alcohol type: beer     Smoking Status: current     Packs/Day: 0.5  Caffeine-Diet-Exercise     Caffeine use/day: none     Does Patient Exercise: no  Hep-HIV-STD-Contraception     HIV Risk: risk noted  Safety-Violence-Falls     Seat Belt Use: yes      Sexual History:  same sex encounters and currently monogomous.        Drug Use:  Yes.        Blood Transfusions:  no.        Travel History:  no.    Allergies (verified): No Known Drug Allergies  Social History: Travel History:  no  Review of Systems  The patient denies anorexia, fever, weight loss, weight gain, vision loss, decreased hearing, hoarseness, chest pain, syncope, dyspnea on exertion, peripheral edema, prolonged cough, headaches, hemoptysis, abdominal pain, melena, hematochezia, severe indigestion/heartburn, hematuria, incontinence, genital  sores, muscle weakness, suspicious skin lesions, transient blindness, difficulty walking, depression, unusual weight change, abnormal bleeding, enlarged lymph nodes, angioedema, and testicular masses.    Physical Exam  General:  Well-developed,well-nourished,in no acute distress; alert,appropriate and cooperative throughout examination Head:  Normocephalic and atraumatic without obvious abnormalities. No apparent alopecia or balding. Eyes:  vision grossly intact, pupils equal, pupils round, and pupils reactive to light.  Right sclera and conjunctiva reddened but no outward structural abnormalities noted.  No inflammation or outward abnormalities noted to Lt cornea. Ears:  R ear normal and L ear normal.   Nose:  External nasal examination shows no deformity or inflammation. Nasal mucosa are pink and moist without lesions or exudates. Mouth:  Oral mucosa and oropharynx without lesions or exudates.  Teeth in good repair. Neck:  No deformities, masses, or tenderness noted. Lungs:  normal respiratory effort and normal breath sounds.   Heart:  normal rate, regular rhythm, no murmur, no gallop, and no rub.   Abdomen:  soft, non-tender, and normal bowel sounds.   Msk:  normal ROM.   Extremities:  No clubbing, cyanosis, edema, or deformity noted with normal full range of motion of all joints.   Neurologic:  alert & oriented X3, cranial nerves II-XII intact, strength normal in all extremities, and gait normal.  Skin:  turgor normal, color normal, no rashes, and no suspicious lesions.   Cervical Nodes:  Shoddy A&P LN's Axillary Nodes:  Shoddy sm LN's Psych:  Cognition and judgment appear intact. Alert and cooperative with normal attention span and concentration. No apparent delusions, illusions, hallucinations   Impression & Recommendations:  Problem # 1:  HIV INFECTION (ICD-042)  CD4 500 @ 20% with VL <20 copies ml.  Clinically stable.  Will CPM, repeat staging labs in 10 weeks and f/u in 3  months His updated medication list for this problem includes:    Fluconazole 100 Mg Tabs (Fluconazole) .Marland Kitchen... 1 tab by mouth once daily x 5 days    Valtrex 1 Gm Tabs (Valacyclovir hcl) .Marland Kitchen... 1 tab by mouth every 12 hours for 7 days  Orders: Est. Patient Level IV (99214)Future Orders: T-CBC w/Diff ([id number]) ... 11/12/2010 T-CD4SP ([facility]) (CD4SP) ... 11/12/2010 T-Comprehensive Metabolic Panel [phone]) ... 11/12/2010 T-HIV Viral Load [phone]) ... 11/12/2010  Problem # 2:  IRITIS - RIGHT EYE (ICD-364.3)  By hx, followed by opthomologist.  Recommend continuing current treatment and ophho f/u for now.  Orders: Est. 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  Recommended keeping eyes shielded and avoid bright lights/sunlight.  Verbally acknowledged.  Will contact ophthomologist today.  Problem # 4:  DYSLIPIDEMIA (ICD-272.4)  Mild.  TChol: 203, HDL- 49, LDL - 145 , VLDL - 12, Trigs - 62.   Recommended carb restriction in diet and increase aerobic exercises. Will re-eval on next lab draw.  Veerbally acknowledged.  Orders: Est. Patient Level IV (99214)Future Orders: T-Lipid Profile ([id number]) ... 11/12/2010   Orders Added: 1)  Est. Patient Level IV [99214] 2)  T-CBC w/Diff [[id number]] 3)  T-CD4SP [facility]) [CD4SP] 4)  T-Comprehensive Metabolic Panel [[id number]] 5)  T-HIV Viral Load [phone] 6)  T-Lipid Profile [[id number]]

## 2010-09-15 ENCOUNTER — Other Ambulatory Visit: Payer: Self-pay | Admitting: *Deleted

## 2010-09-16 ENCOUNTER — Telehealth: Payer: Self-pay | Admitting: Adult Health

## 2010-09-16 NOTE — Telephone Encounter (Signed)
PRESCRIPTION ASSISTANCE APPLICATION FOR [facility] [employer] COMPLETED FOR LEXAPRO.  MR. Shawn Brooks WAS CALLED TO SCHEDULE A TIME TO COME IN AND SIGN.  MR. Shawn Brooks STATED THAT HE HAS AN APPT. WITH [facility] ON Monday AND THEY MAY BE DOING THAT APPLICATION WITH HIM, BUT HE WAS GIVEN MY NAME AND NUMBER IN CASE HE NEEDS ME TO MOVE FORWARD WITH IT AND WILL CALL ME BACK IF HE DOES.

## 2010-09-24 ENCOUNTER — Other Ambulatory Visit: Payer: Self-pay | Admitting: Adult Health

## 2010-09-24 DIAGNOSIS — F329 Major depressive disorder, single episode, unspecified: Secondary | ICD-10-CM

## 2010-09-24 MED ORDER — ESCITALOPRAM OXALATE 20 MG PO TABS: 20.0000 mg | ORAL_TABLET | Freq: Every day | ORAL | Status: DC

## 2010-11-09 ENCOUNTER — Other Ambulatory Visit: Payer: Self-pay | Admitting: *Deleted

## 2010-11-09 ENCOUNTER — Telehealth: Payer: Self-pay | Admitting: *Deleted

## 2010-11-09 DIAGNOSIS — F329 Major depressive disorder, single episode, unspecified: Secondary | ICD-10-CM

## 2010-11-09 MED ORDER — ESCITALOPRAM OXALATE 20 MG PO TABS: 20.0000 mg | ORAL_TABLET | Freq: Every day | ORAL | Status: DC

## 2010-11-09 NOTE — Telephone Encounter (Signed)
Called to notify patient that PA med for Lexapro arrived.  Patient will pick up 11/10/10  Lot # [id number] Exp. 9/16 [name] CMA

## 2010-11-12 ENCOUNTER — Other Ambulatory Visit (INDEPENDENT_AMBULATORY_CARE_PROVIDER_SITE_OTHER): Payer: Self-pay

## 2010-11-12 DIAGNOSIS — B20 Human immunodeficiency virus [HIV] disease: Secondary | ICD-10-CM

## 2010-11-13 LAB — CBC WITH DIFFERENTIAL/PLATELET
Basophils Absolute: 0 10*3/uL (ref 0.0–0.1)
Basophils Relative: 0 % (ref 0–1)
Eosinophils Absolute: 0 10*3/uL (ref 0.0–0.7)
Eosinophils Relative: 1 % (ref 0–5)
HCT: 45.9 % (ref 39.0–52.0)
Hemoglobin: 15.7 g/dL (ref 13.0–17.0)
Lymphocytes Relative: 44 % (ref 12–46)
Lymphs Abs: 2.2 10*3/uL (ref 0.7–4.0)
MCH: 31.6 pg (ref 26.0–34.0)
MCHC: 34.2 g/dL (ref 30.0–36.0)
MCV: 92.4 fL (ref 78.0–100.0)
Monocytes Absolute: 0.4 10*3/uL (ref 0.1–1.0)
Monocytes Relative: 9 % (ref 3–12)
Neutro Abs: 2.3 10*3/uL (ref 1.7–7.7)
Neutrophils Relative %: 46 % (ref 43–77)
Platelets: 184 10*3/uL (ref 150–400)
RBC: 4.97 MIL/uL (ref 4.22–5.81)
RDW: 14.1 % (ref 11.5–15.5)
WBC: 5 10*3/uL (ref 4.0–10.5)

## 2010-11-13 LAB — COMPLETE METABOLIC PANEL WITH GFR
ALT: 12 U/L (ref 0–53)
AST: 21 U/L (ref 0–37)
Albumin: 4.3 g/dL (ref 3.5–5.2)
Alkaline Phosphatase: 76 U/L (ref 39–117)
BUN: 12 mg/dL (ref 6–23)
CO2: 28 mEq/L (ref 19–32)
Calcium: 9.2 mg/dL (ref 8.4–10.5)
Chloride: 105 mEq/L (ref 96–112)
Creat: 1.18 mg/dL (ref 0.40–1.50)
GFR, Est African American: >60 mL/min (ref >60)
GFR, Est Non African American: >60 mL/min (ref >60)
Glucose, Bld: 86 mg/dL (ref 70–99)
Potassium: 4.3 mEq/L (ref 3.5–5.3)
Sodium: 137 mEq/L (ref 135–145)
Total Bilirubin: 0.4 mg/dL (ref 0.3–1.2)
Total Protein: 7.4 g/dL (ref 6.0–8.3)

## 2010-11-13 LAB — LIPID PANEL
Cholesterol: 159 mg/dL (ref 0–200)
HDL: 36 mg/dL — ABNORMAL LOW (ref >39)
LDL Cholesterol: 114 mg/dL — ABNORMAL HIGH (ref 0–99)
Total CHOL/HDL Ratio: 4.4 Ratio
Triglycerides: 46 mg/dL (ref <150)
VLDL: 9 mg/dL (ref 0–40)

## 2010-11-13 LAB — HIV-1 RNA QUANT-NO REFLEX-BLD
HIV 1 RNA Quant: <20 copies/mL (ref <20)
HIV-1 RNA Quant, Log: <1.3 {Log} (ref <1.30)

## 2010-11-13 LAB — T-HELPER CELL (CD4) - (RCID CLINIC ONLY)
CD4 % Helper T Cell: 25 % — ABNORMAL LOW (ref 33–55)
CD4 T Cell Abs: 530 uL (ref 400–2700)

## 2010-11-13 NOTE — Assessment & Plan Note (Signed)
[facility]                         GASTROENTEROLOGY OFFICE NOTE   TYMARION, EVERARD                      MRN:          [id number]  DATE:08/04/2007                            DOB:          [birth year]    REFERRING PHYSICIAN:  Lorre Nick, M.D.   Patient is referred by Dr. Lorre Nick at [facility] Emergency Room.  He has no primary care physician.   REASON FOR REFERRAL:  Dr. Freida Busman asked me to evaluate Mr. Prentiss in  consultation regarding abdominal pain, rectal bleeding.   HISTORY OF PRESENT ILLNESS:  Mr. Horsch is a very pleasant 35 year old  man who has had intermittent rectal bleeding for many years.  He was  told he had hemorrhoids even 10 to 15 years ago.  It was pretty sporadic  bleeding.  More recently he has had rectal bleeding 2 to 3 times a day.  This is associated with abdominal cramping and some frequency, although  he really has not had frank diarrhea.  He was evaluated in the emergency  room at [facility] with some blood tests and imaging studies.  The blood  tests showed CBC was normal.  A complete metabolic ;profile and lipase  were normal.  CT scan done with IV and oral contrast was completely  normal as well.  He as referred here for further evaluation.   REVIEW OF SYSTEMS:  Notable for stable weight, but otherwise essentially  normal, and is available on the nurse intake sheet.   PAST MEDICAL HISTORY:  1. Anxiety.  2. Depression.  3. Sleep apnea.  4. Migraines.   CURRENT MEDICATIONS:  Prilosec twice daily.   ALLERGIES:  No known drug allergies.   SOCIAL HISTORY:  Single.  Has an 46-year-old daughter.  Works in Engineering geologist.  Smokes a pack of cigarettes a day. Drinks about a 12 pack of beer a  week.  Occasionally uses marijuana.   FAMILY HISTORY:  Brother, grandmother and uncle with Crohn's disease.   PHYSICAL EXAMINATION:  5 feet 8 inches.  195 pounds.  Blood pressure  110/62, pulse 68.  CONSTITUTIONAL:  Generally  well appearing.  NEUROLOGIC:  Alert and oriented x3.  EYES:  Extraocular movements intact.  MOUTH:  Oropharynx moist.  No lesions.  NECK:  Supple.  No lymphadenopathy.  CARDIOVASCULAR:  Heart regular rate and rhythm.  LUNGS:  Clear to auscultation bilaterally.  ABDOMEN:  Soft, nontender, nondistended.  Normal bowel sounds.  EXTREMITIES:  No lower extremity edema.  SKIN:  No rashes or lesions of visible extremities.   ASSESSMENT/PLAN:  [age]-year-old man with intermittent rectal  bleeding, crampy abdominal discomforts and family history of Crohn's  disease.   Normal CBC, normal complete metabolic profile as well as normal CAT scan  just 3 days ago argues against active colitis or Crohn's disease, but  given his family history and his symptoms, I think we should proceed  with full colonoscopy at his soonest convenience.  He has known  hemorrhoids and perhaps those are causing his bleeding, and the  remainder of his symptoms are functional.  Colonoscopy will help to  determine this.  I see no reason for any further blood tests or imaging  studies prior to that.     Rachael Fee, MD  Electronically Signed    DPJ/MedQ  DD: 08/04/2007  DT: 08/04/2007  Job #: [id number]

## 2010-11-26 ENCOUNTER — Ambulatory Visit: Payer: Self-pay | Admitting: Adult Health

## 2010-12-01 ENCOUNTER — Encounter: Payer: Self-pay | Admitting: Adult Health

## 2010-12-01 ENCOUNTER — Ambulatory Visit (INDEPENDENT_AMBULATORY_CARE_PROVIDER_SITE_OTHER): Payer: Self-pay | Admitting: Adult Health

## 2010-12-01 VITALS — BP 129/81 | HR 84 | Temp 98.0°F | Wt 204.0 lb

## 2010-12-01 DIAGNOSIS — F329 Major depressive disorder, single episode, unspecified: Secondary | ICD-10-CM

## 2010-12-01 DIAGNOSIS — N529 Male erectile dysfunction, unspecified: Secondary | ICD-10-CM

## 2010-12-01 DIAGNOSIS — Z79899 Other long term (current) drug therapy: Secondary | ICD-10-CM

## 2010-12-01 DIAGNOSIS — B20 Human immunodeficiency virus [HIV] disease: Secondary | ICD-10-CM

## 2010-12-01 DIAGNOSIS — Z113 Encounter for screening for infections with a predominantly sexual mode of transmission: Secondary | ICD-10-CM

## 2010-12-01 MED ORDER — TADALAFIL 10 MG PO TABS: 5.0000 mg | ORAL_TABLET | ORAL | Status: DC | PRN

## 2010-12-01 MED ORDER — ESCITALOPRAM OXALATE 20 MG PO TABS: 20.0000 mg | ORAL_TABLET | Freq: Every day | ORAL | Status: DC

## 2010-12-01 MED ORDER — EMTRICITABINE-TENOFOVIR DF 200-300 MG PO TABS: 1.0000 | ORAL_TABLET | Freq: Every day | ORAL | Status: DC

## 2010-12-01 MED ORDER — RALTEGRAVIR POTASSIUM 400 MG PO TABS: 400.0000 mg | ORAL_TABLET | Freq: Two times a day (BID) | ORAL | Status: DC

## 2010-12-01 NOTE — Progress Notes (Signed)
  Subjective:    Patient ID: Shawn Brooks, male    DOB: Jan 22, 1976, 35 y.o.   MRN: [id number]  HPI Shawn Brooks presents today for routine scheduled followup. He remains adherent to his antiretrovirals with good tolerance and no complications. He still continues to take Lexapro for his depression which has provided him good response. He relates one episode where he was out of medications for 5 days and he reports a significant change in both mood and behavior, but has not had problems since he resumed his medication.   Review of Systems  Constitutional: Negative.   HENT: Negative.   Eyes: Negative.   Respiratory: Negative.   Cardiovascular: Negative.   Gastrointestinal: Negative.   Genitourinary: Negative.   Musculoskeletal: Positive for arthralgias.  Skin: Negative.   Neurological: Negative.   Hematological: Negative.   Psychiatric/Behavioral: Negative.        Objective:   Physical Exam  Constitutional: He is oriented to person, place, and time. He appears well-developed and well-nourished. No distress.  HENT:  Head: Normocephalic and atraumatic.  Right Ear: External ear normal.  Left Ear: External ear normal.  Mouth/Throat: Oropharynx is clear and moist.  Eyes: Conjunctivae and EOM are normal. Pupils are equal, round, and reactive to light.  Neck: Normal range of motion. Neck supple.  Cardiovascular: Normal rate, regular rhythm, normal heart sounds and intact distal pulses.   Pulmonary/Chest: Effort normal and breath sounds normal.  Abdominal: Soft. Bowel sounds are normal.  Musculoskeletal: Normal range of motion.  Neurological: He is alert and oriented to person, place, and time. No cranial nerve deficit. He exhibits normal muscle tone. Coordination normal.  Skin: Skin is warm and dry.  Psychiatric: He has a normal mood and affect. His behavior is normal. Judgment and thought content normal.          Assessment & Plan:  1. HIV. Labs obtained 11/12/2010 show a CD4 count  of 530 at 25% with an HIV viral load of less than 20 copies/mL.Marland Kitchen Clinically stable on current regimen. Recommend continuing present management, repeating labs in 10 weeks, and a followup in 3 months.  2. Anxiety/Depression. We'll continue current dosing of Lexapro. I discussed the possibility in the next few months, we may consider decreased dosing to see how he manages on a lower dose unless there are new stressors that develop between now, and then.  He verbally acknowledged all of this information and agreed with plan of care.

## 2010-12-23 ENCOUNTER — Other Ambulatory Visit: Payer: Self-pay | Admitting: *Deleted

## 2010-12-23 DIAGNOSIS — F329 Major depressive disorder, single episode, unspecified: Secondary | ICD-10-CM

## 2010-12-23 MED ORDER — ESCITALOPRAM OXALATE 20 MG PO TABS: 20.0000 mg | ORAL_TABLET | Freq: Every day | ORAL | Status: DC

## 2011-01-01 ENCOUNTER — Telehealth: Payer: Self-pay | Admitting: *Deleted

## 2011-01-01 NOTE — Telephone Encounter (Signed)
Patient calling c/o dental pain.  He has an appointment with Shawn Brooks at [facility] on Monday 01/04/11 and he will fill out the form for the Dental Clinic then. [name] CMA

## 2011-01-11 ENCOUNTER — Telehealth: Payer: Self-pay | Admitting: *Deleted

## 2011-01-11 NOTE — Telephone Encounter (Signed)
Patient called requesting to be changed from Lexapro which has generic and he can not get through patient assistance.  Nida Boatman will look into changing it, but if he can not find something comparable, patient may have to go to the [facility].  Will forward this note to Carl, [place] representative also. [name] CMA

## 2011-01-27 ENCOUNTER — Telehealth: Payer: Self-pay | Admitting: *Deleted

## 2011-01-27 NOTE — Telephone Encounter (Signed)
Generic lexapro not covered thru [employer], [phone]. Message to NP & PAP rep

## 2011-01-27 NOTE — Telephone Encounter (Signed)
Pt walked in & brought the form from the pharmacy. Generic is covered. To NP to see if he will write for the generic or if he is going to be referred to mental health

## 2011-01-29 ENCOUNTER — Other Ambulatory Visit: Payer: Self-pay | Admitting: *Deleted

## 2011-01-29 ENCOUNTER — Telehealth: Payer: Self-pay | Admitting: *Deleted

## 2011-01-29 DIAGNOSIS — F329 Major depressive disorder, single episode, unspecified: Secondary | ICD-10-CM

## 2011-01-29 MED ORDER — ESCITALOPRAM OXALATE 20 MG PO TABS: 20.0000 mg | ORAL_TABLET | Freq: Every day | ORAL | Status: DC

## 2011-01-29 NOTE — Telephone Encounter (Signed)
I see to communications regarding Shawn Brooks's, Lexapro. It's okay to give him generic dosing of the medication, which is escitalopram 20 mg by mouth daily. If he picks it up at the health department, but he picks up someplace else, we might be able to ascend and electronically. Let me know what he specifically needs and where he wants to pick up his medication. Thanks

## 2011-01-29 NOTE — Telephone Encounter (Signed)
[name] is no longer carrying lexapro generic or name brand as of 8/12. There is no other free program that give this. Not on any $4 plans. Not on health dept formulary.  NP states he can go to the [facility] if he wants this or he can change drugs.   Spoke with pt & told him above. He is going to call the [facility] again (got poor customer service 1st time & did not proceed with them) advised to ask if meds are free if he is a client there. If not he can check price at drug stores or call back & ask for a change of meds. He is reluctant to do this since the lexapro works

## 2011-01-29 NOTE — Telephone Encounter (Signed)
He called back to let us know he has an appt 02/03/11 thru [facility] when he called [facility]. States he has enough meds to last until his appt

## 2011-01-29 NOTE — Telephone Encounter (Signed)
Phone call to pt to let him know that rx is ready for pick up on Monday, Aug. 6.  Pt verbalized understanding. Jennet Maduro, RN

## 2011-02-03 ENCOUNTER — Other Ambulatory Visit: Payer: Self-pay | Admitting: Infectious Diseases

## 2011-02-03 ENCOUNTER — Other Ambulatory Visit (INDEPENDENT_AMBULATORY_CARE_PROVIDER_SITE_OTHER): Payer: Self-pay

## 2011-02-03 DIAGNOSIS — Z79899 Other long term (current) drug therapy: Secondary | ICD-10-CM

## 2011-02-03 DIAGNOSIS — B20 Human immunodeficiency virus [HIV] disease: Secondary | ICD-10-CM

## 2011-02-03 DIAGNOSIS — Z113 Encounter for screening for infections with a predominantly sexual mode of transmission: Secondary | ICD-10-CM

## 2011-02-03 LAB — CBC WITH DIFFERENTIAL/PLATELET
Basophils Absolute: 0 10*3/uL (ref 0.0–0.1)
Basophils Relative: 0 % (ref 0–1)
Eosinophils Absolute: 0 10*3/uL (ref 0.0–0.7)
Eosinophils Relative: 1 % (ref 0–5)
HCT: 47.4 % (ref 39.0–52.0)
Hemoglobin: 16.3 g/dL (ref 13.0–17.0)
Lymphocytes Relative: 24 % (ref 12–46)
Lymphs Abs: 2 10*3/uL (ref 0.7–4.0)
MCH: 31.7 pg (ref 26.0–34.0)
MCHC: 34.4 g/dL (ref 30.0–36.0)
MCV: 92.2 fL (ref 78.0–100.0)
Monocytes Absolute: 0.6 10*3/uL (ref 0.1–1.0)
Monocytes Relative: 8 % (ref 3–12)
Neutro Abs: 5.6 10*3/uL (ref 1.7–7.7)
Neutrophils Relative %: 68 % (ref 43–77)
Platelets: 181 10*3/uL (ref 150–400)
RBC: 5.14 MIL/uL (ref 4.22–5.81)
RDW: 13.6 % (ref 11.5–15.5)
WBC: 8.3 10*3/uL (ref 4.0–10.5)

## 2011-02-03 LAB — URINALYSIS
Bilirubin Urine: NEGATIVE
Glucose, UA: NEGATIVE mg/dL
Hgb urine dipstick: NEGATIVE
Ketones, ur: NEGATIVE mg/dL
Leukocytes, UA: NEGATIVE
Nitrite: NEGATIVE
Protein, ur: NEGATIVE mg/dL
Specific Gravity, Urine: 1.019 (ref 1.005–1.030)
Urobilinogen, UA: 0.2 mg/dL (ref 0.0–1.0)
pH: 7 (ref 5.0–8.0)

## 2011-02-03 LAB — COMPLETE METABOLIC PANEL WITH GFR
ALT: 12 U/L (ref 0–53)
AST: 20 U/L (ref 0–37)
Albumin: 4.4 g/dL (ref 3.5–5.2)
Alkaline Phosphatase: 93 U/L (ref 39–117)
BUN: 11 mg/dL (ref 6–23)
CO2: 29 mEq/L (ref 19–32)
Calcium: 9.6 mg/dL (ref 8.4–10.5)
Chloride: 101 mEq/L (ref 96–112)
Creat: 1.18 mg/dL (ref 0.50–1.35)
GFR, Est African American: >60 mL/min (ref >60)
GFR, Est Non African American: >60 mL/min (ref >60)
Glucose, Bld: 75 mg/dL (ref 70–99)
Potassium: 4.3 mEq/L (ref 3.5–5.3)
Sodium: 136 mEq/L (ref 135–145)
Total Bilirubin: 0.6 mg/dL (ref 0.3–1.2)
Total Protein: 8.1 g/dL (ref 6.0–8.3)

## 2011-02-03 LAB — RPR

## 2011-02-04 LAB — T-HELPER CELL (CD4) - (RCID CLINIC ONLY)
CD4 % Helper T Cell: 26 % — ABNORMAL LOW (ref 33–55)
CD4 T Cell Abs: 510 uL (ref 400–2700)

## 2011-02-04 LAB — GC/CHLAMYDIA PROBE AMP, URINE
Chlamydia, Swab/Urine, PCR: NEGATIVE
GC Probe Amp, Urine: NEGATIVE

## 2011-02-05 LAB — HIV-1 RNA QUANT-NO REFLEX-BLD
HIV 1 RNA Quant: 34 copies/mL — ABNORMAL HIGH (ref <20)
HIV-1 RNA Quant, Log: 1.53 {Log} — ABNORMAL HIGH (ref <1.30)

## 2011-02-16 ENCOUNTER — Encounter: Payer: Self-pay | Admitting: Adult Health

## 2011-02-16 ENCOUNTER — Ambulatory Visit (INDEPENDENT_AMBULATORY_CARE_PROVIDER_SITE_OTHER): Payer: Self-pay | Admitting: Adult Health

## 2011-02-16 ENCOUNTER — Ambulatory Visit: Payer: Self-pay | Admitting: Adult Health

## 2011-02-16 VITALS — BP 109/67 | HR 69 | Temp 97.7°F | Ht 68.0 in | Wt 207.0 lb

## 2011-02-16 DIAGNOSIS — B20 Human immunodeficiency virus [HIV] disease: Secondary | ICD-10-CM

## 2011-03-19 LAB — URINALYSIS, ROUTINE W REFLEX MICROSCOPIC
Glucose, UA: NEGATIVE
Hgb urine dipstick: NEGATIVE
Protein, ur: NEGATIVE
Specific Gravity, Urine: 1.003 — ABNORMAL LOW
pH: 6

## 2011-03-19 LAB — DIFFERENTIAL
Basophils Relative: 0
Lymphs Abs: 3.3
Monocytes Absolute: 0.7
Monocytes Relative: 7
Neutro Abs: 5.8

## 2011-03-19 LAB — CBC
HCT: 47
Platelets: 197
RDW: 13.6

## 2011-03-19 LAB — COMPREHENSIVE METABOLIC PANEL
Albumin: 3.9
Alkaline Phosphatase: 78
BUN: 9
Potassium: 4.1
Sodium: 138
Total Protein: 6.6

## 2011-03-19 LAB — OCCULT BLOOD X 1 CARD TO LAB, STOOL: Fecal Occult Bld: POSITIVE

## 2011-03-25 LAB — DIFFERENTIAL
Basophils Relative: 0
Lymphs Abs: 2.1
Monocytes Absolute: 0.5
Monocytes Relative: 6
Neutro Abs: 5.3
Neutrophils Relative %: 67

## 2011-03-25 LAB — URINALYSIS, ROUTINE W REFLEX MICROSCOPIC
Glucose, UA: NEGATIVE
Leukocytes, UA: NEGATIVE
Nitrite: NEGATIVE
Specific Gravity, Urine: 1.035 — ABNORMAL HIGH
pH: 5

## 2011-03-25 LAB — URINE MICROSCOPIC-ADD ON

## 2011-03-25 LAB — COMPREHENSIVE METABOLIC PANEL
ALT: 31
Albumin: 3.9
Alkaline Phosphatase: 77
BUN: 15
Calcium: 8.8
Potassium: 4
Sodium: 135
Total Protein: 7.1

## 2011-03-25 LAB — CBC
MCHC: 33.8
Platelets: 111 — ABNORMAL LOW
RDW: 13.6

## 2011-03-26 ENCOUNTER — Emergency Department (HOSPITAL_COMMUNITY)
Admission: EM | Admit: 2011-03-26 | Discharge: 2011-03-26 | Disposition: A | Discharge status: Home / Self Care | Payer: Self-pay | Attending: Emergency Medicine | Admitting: Emergency Medicine

## 2011-03-26 DIAGNOSIS — H53149 Visual discomfort, unspecified: Secondary | ICD-10-CM | POA: Insufficient documentation

## 2011-03-26 DIAGNOSIS — R112 Nausea with vomiting, unspecified: Secondary | ICD-10-CM | POA: Insufficient documentation

## 2011-03-26 DIAGNOSIS — B20 Human immunodeficiency virus [HIV] disease: Secondary | ICD-10-CM | POA: Insufficient documentation

## 2011-03-26 DIAGNOSIS — R51 Headache: Secondary | ICD-10-CM | POA: Insufficient documentation

## 2011-03-26 LAB — POCT I-STAT, CHEM 8
BUN: 14 mg/dL (ref 6–23)
Calcium, Ion: 1.17 mmol/L (ref 1.12–1.32)
Creatinine, Ser: 1.1 mg/dL (ref 0.50–1.35)
Glucose, Bld: 88 mg/dL (ref 70–99)
Hemoglobin: 17.3 g/dL — ABNORMAL HIGH (ref 13.0–17.0)
Sodium: 139 mEq/L (ref 135–145)
TCO2: 22 mmol/L (ref 0–100)

## 2011-03-26 LAB — CBC
HCT: 45.8 % (ref 39.0–52.0)
Hemoglobin: 16.3 g/dL (ref 13.0–17.0)
MCH: 32 pg (ref 26.0–34.0)
MCHC: 35.6 g/dL (ref 30.0–36.0)
MCV: 89.8 fL (ref 78.0–100.0)
RBC: 5.1 MIL/uL (ref 4.22–5.81)

## 2011-03-26 LAB — DIFFERENTIAL
Basophils Relative: 0 % (ref 0–1)
Lymphs Abs: 2.6 10*3/uL (ref 0.7–4.0)
Monocytes Absolute: 0.6 10*3/uL (ref 0.1–1.0)
Monocytes Relative: 8 % (ref 3–12)
Neutro Abs: 4.6 10*3/uL (ref 1.7–7.7)
Neutrophils Relative %: 59 % (ref 43–77)

## 2011-04-13 ENCOUNTER — Ambulatory Visit (INDEPENDENT_AMBULATORY_CARE_PROVIDER_SITE_OTHER): Payer: Self-pay

## 2011-04-13 ENCOUNTER — Inpatient Hospital Stay (INDEPENDENT_AMBULATORY_CARE_PROVIDER_SITE_OTHER)
Admission: RE | Admit: 2011-04-13 | Discharge: 2011-04-13 | Disposition: A | Discharge status: Home / Self Care | Payer: Self-pay | Source: Ambulatory Visit | Attending: Emergency Medicine | Admitting: Emergency Medicine

## 2011-04-13 DIAGNOSIS — J020 Streptococcal pharyngitis: Secondary | ICD-10-CM

## 2011-04-15 ENCOUNTER — Ambulatory Visit: Payer: Self-pay

## 2011-05-26 ENCOUNTER — Other Ambulatory Visit: Payer: Self-pay | Admitting: Infectious Disease

## 2011-05-26 ENCOUNTER — Other Ambulatory Visit (INDEPENDENT_AMBULATORY_CARE_PROVIDER_SITE_OTHER): Payer: Self-pay

## 2011-05-26 DIAGNOSIS — B20 Human immunodeficiency virus [HIV] disease: Secondary | ICD-10-CM

## 2011-05-26 LAB — COMPLETE METABOLIC PANEL WITH GFR
AST: 23 U/L (ref 0–37)
BUN: 15 mg/dL (ref 6–23)
Calcium: 9.4 mg/dL (ref 8.4–10.5)
Chloride: 104 mEq/L (ref 96–112)
Creat: 1.33 mg/dL (ref 0.50–1.35)
GFR, Est African American: 78 mL/min
Total Bilirubin: 0.4 mg/dL (ref 0.3–1.2)

## 2011-05-26 LAB — CBC WITH DIFFERENTIAL/PLATELET
Basophils Absolute: 0 10*3/uL (ref 0.0–0.1)
Eosinophils Absolute: 0.1 10*3/uL (ref 0.0–0.7)
Eosinophils Relative: 1 % (ref 0–5)
HCT: 47.3 % (ref 39.0–52.0)
Lymphocytes Relative: 30 % (ref 12–46)
MCH: 31.5 pg (ref 26.0–34.0)
MCHC: 34.5 g/dL (ref 30.0–36.0)
MCV: 91.5 fL (ref 78.0–100.0)
Monocytes Absolute: 0.8 10*3/uL (ref 0.1–1.0)
Platelets: 174 10*3/uL (ref 150–400)
RDW: 13.3 % (ref 11.5–15.5)
WBC: 10.5 10*3/uL (ref 4.0–10.5)

## 2011-05-27 LAB — T-HELPER CELL (CD4) - (RCID CLINIC ONLY): CD4 T Cell Abs: 810 uL (ref 400–2700)

## 2011-05-31 LAB — HIV-1 RNA QUANT-NO REFLEX-BLD
HIV 1 RNA Quant: 90 copies/mL — ABNORMAL HIGH (ref <20)
HIV-1 RNA Quant, Log: 1.95 {Log} — ABNORMAL HIGH (ref <1.30)

## 2011-06-09 ENCOUNTER — Ambulatory Visit (INDEPENDENT_AMBULATORY_CARE_PROVIDER_SITE_OTHER): Payer: Self-pay | Admitting: Infectious Diseases

## 2011-06-09 ENCOUNTER — Encounter: Payer: Self-pay | Admitting: Infectious Diseases

## 2011-06-09 VITALS — BP 111/66 | HR 97 | Temp 98.1°F | Ht 69.0 in | Wt 209.4 lb

## 2011-06-09 DIAGNOSIS — Z113 Encounter for screening for infections with a predominantly sexual mode of transmission: Secondary | ICD-10-CM

## 2011-06-09 DIAGNOSIS — B20 Human immunodeficiency virus [HIV] disease: Secondary | ICD-10-CM

## 2011-06-09 DIAGNOSIS — Z79899 Other long term (current) drug therapy: Secondary | ICD-10-CM

## 2011-06-09 DIAGNOSIS — E785 Hyperlipidemia, unspecified: Secondary | ICD-10-CM

## 2011-06-09 MED ORDER — EMTRICITAB-RILPIVIR-TENOFOV DF 200-25-300 MG PO TABS: 1.0000 | ORAL_TABLET | Freq: Every day | ORAL | Status: DC

## 2011-06-09 NOTE — Progress Notes (Signed)
  Subjective:    Patient ID: Shawn Brooks, male    DOB: 28-Oct-1975, 35 y.o.   MRN: [id number]  HPI 35 yo M with HIV+ dx July 2011, last CD4 810 and VL 90 (05-26-11). Taking TRV/ISN. No problems. taking medicines with help of alarm.    Review of Systems  Constitutional: Negative for appetite change and unexpected weight change.  Gastrointestinal: Positive for constipation. Negative for abdominal pain and diarrhea.  Genitourinary: Negative for dysuria.       Objective:   Physical Exam  Constitutional: He appears well-developed and well-nourished.  HENT:  Mouth/Throat: No oropharyngeal exudate.  Eyes: EOM are normal. Pupils are equal, round, and reactive to light.  Neck: Neck supple.  Cardiovascular: Normal rate, regular rhythm and normal heart sounds.   Pulmonary/Chest: Effort normal and breath sounds normal.  Abdominal: Soft. Bowel sounds are normal. There is no tenderness. There is no rebound.  Lymphadenopathy:    He has no cervical adenopathy.          Assessment & Plan:

## 2011-06-09 NOTE — Assessment & Plan Note (Signed)
Suspect he is missing some doses of his ISN. Will change him to complera which he is very excited about. He understands to take this with food and not with any ant-acids. Will see him back in 3-4 months with labs. He is given condoms. He refuses flu shot.

## 2011-06-09 NOTE — Assessment & Plan Note (Signed)
Will recheck his lipids at next visit.

## 2011-07-26 NOTE — Assessment & Plan Note (Signed)
CD4 count 5:30 at 25% with a viral load of 34 copies per mL. Slight neurological blip otherwise, clinically stable. Continue present management.

## 2011-07-26 NOTE — Progress Notes (Signed)
Patient ID: Shawn Brooks, male   DOB: 1975-09-13, 36 y.o.   MRN: [id number] Subjective:    Patient ID: Shawn Brooks is a 36 y.o. male.  Chief Complaint: HIV Follow-up Visit Shawn Brooks is here for follow-up of HIV infection. He is feeling better since his last visit.  He claims continued adherence to therapy with good tolerance and no complications. There are not additional complaints.   Data Review: Diagnostic studies reviewed.  Review of Systems - General ROS: negative Psychological ROS: negative for - anxiety, concentration difficulties, depression or mood swings Respiratory ROS: no cough, shortness of breath, or wheezing Cardiovascular ROS: no chest pain or dyspnea on exertion Gastrointestinal ROS: no abdominal pain, change in bowel habits, or black or bloody stools Dermatological ROS: negative  Objective:  General appearance: alert, cooperative, appears stated age and no distress Head: Normocephalic, without obvious abnormality, atraumatic Neck: no adenopathy, no carotid bruit, no JVD, supple, symmetrical, trachea midline and thyroid not enlarged, symmetric, no tenderness/mass/nodules Resp: clear to auscultation bilaterally Cardio: regular rate and rhythm, S1, S2 normal, no murmur, click, rub or gallop GI: soft, non-tender; bowel sounds normal; no masses,  no organomegaly Neurologic: Grossly normal Skin:  No active lesions or rashes noted.    Psych:  No vegetative signs or delusional behaviors noted.    Laboratory: From 02/03/2011 , CD4 percent was 25 %.  HIV 1 RNA Quant (copies/mL)  Date Value     02/03/2011 34*  11/12/2010 <20      CD4 T Cell Abs (cmm)  Date Value     02/03/2011 510   11/12/2010 530      Hep B S Ab (no units)  Date Value  05/06/2010 NEG      Hepatitis B Surface Ag (no units)  Date Value  05/06/2010 NEG      HCV Ab (no units)  Date Value  05/06/2010 NEGATIVE       LIPIDS @RERESUFAST (HDL:3)@ Lab Results  Component Value Date   LDLCALC 114* 11/12/2010   LDLCALC 145* 08/20/2010   LDLCALC 116* 05/06/2010   Lab Results  Component Value Date   TRIG 46 11/12/2010   TRIG 62 08/20/2010   TRIG 82 05/06/2010   Lab Results  Component Value Date   CHOLHDL 4.4 11/12/2010   CHOLHDL 4.4 Ratio 08/20/2010   CHOLHDL 4.6 Ratio 05/06/2010   No results found for this basename: LDLDIRECT   SUFAST(CHOL:3)@ @     Assessment/Plan:   HIV INFECTION CD4 count 5:30 at 25% with a viral load of 34 copies per mL. Slight neurological blip otherwise, clinically stable. Continue present management.    Counseling provided on prevention of transmission of HIV. Medication adherence discussed with patient. Follow up visit in 4 months with labs 2 weeks prior to appointment.    Dimitriy Carreras A. Sundra Aland, MS, [facility] [phone]  07/26/2011, 10:51 AM

## 2011-08-03 ENCOUNTER — Other Ambulatory Visit (INDEPENDENT_AMBULATORY_CARE_PROVIDER_SITE_OTHER): Payer: Self-pay

## 2011-08-03 DIAGNOSIS — B20 Human immunodeficiency virus [HIV] disease: Secondary | ICD-10-CM

## 2011-08-04 LAB — COMPLETE METABOLIC PANEL WITH GFR
ALT: 13 U/L (ref 0–53)
AST: 23 U/L (ref 0–37)
Alkaline Phosphatase: 92 U/L (ref 39–117)
Creat: 1.16 mg/dL (ref 0.50–1.35)
GFR, Est African American: >89 mL/min
Total Bilirubin: 0.4 mg/dL (ref 0.3–1.2)

## 2011-08-04 LAB — CBC WITH DIFFERENTIAL/PLATELET
Basophils Absolute: 0 10*3/uL (ref 0.0–0.1)
Basophils Relative: 0 % (ref 0–1)
Eosinophils Absolute: 0.1 10*3/uL (ref 0.0–0.7)
Eosinophils Relative: 1 % (ref 0–5)
MCH: 31.2 pg (ref 26.0–34.0)
MCV: 92.7 fL (ref 78.0–100.0)
Neutrophils Relative %: 73 % (ref 43–77)
Platelets: 168 10*3/uL (ref 150–400)
RDW: 14.2 % (ref 11.5–15.5)

## 2011-08-05 LAB — HIV-1 RNA QUANT-NO REFLEX-BLD
HIV 1 RNA Quant: 125 copies/mL — ABNORMAL HIGH (ref <20)
HIV-1 RNA Quant, Log: 2.1 {Log} — ABNORMAL HIGH (ref <1.30)

## 2011-08-16 ENCOUNTER — Ambulatory Visit (INDEPENDENT_AMBULATORY_CARE_PROVIDER_SITE_OTHER): Payer: Self-pay | Admitting: Adult Health

## 2011-08-16 ENCOUNTER — Encounter: Payer: Self-pay | Admitting: Adult Health

## 2011-08-16 DIAGNOSIS — L600 Ingrowing nail: Secondary | ICD-10-CM

## 2011-08-16 DIAGNOSIS — L84 Corns and callosities: Secondary | ICD-10-CM

## 2011-08-16 DIAGNOSIS — G8929 Other chronic pain: Secondary | ICD-10-CM

## 2011-08-16 DIAGNOSIS — B20 Human immunodeficiency virus [HIV] disease: Secondary | ICD-10-CM

## 2011-08-16 DIAGNOSIS — M545 Low back pain, unspecified: Secondary | ICD-10-CM

## 2011-08-16 NOTE — Assessment & Plan Note (Signed)
Clinically stable on current regimen. Continue present management.  Counseling provided on prevention of transmission of HIV. Condoms offered:  Yes Medication adherence discussed with patient. Medication refills ordered as needed. Referrals: Podiatrist and dermatologist Follow up visit in 4 months with labs 2 weeks prior to appointment. Patient verbally acknowledged information provided to them and agreed with plan of care.

## 2011-08-16 NOTE — Assessment & Plan Note (Signed)
Unusual presentation to the palmar surfaces of the hand. It is possible that these may also be verruca. Referral to dermatology

## 2011-08-16 NOTE — Assessment & Plan Note (Signed)
Recurrent fifth digit, left foot. Referred to podiatrist

## 2011-08-16 NOTE — Assessment & Plan Note (Signed)
This is by history alone, and not something that has been previously addressed. I informed him that our clinic, is not equipped to be determining disability for low back pain. However, there are no sources under the [facility]. That would be able to evaluate this as we are aware of. Will discuss this matter with his case manager.

## 2011-08-16 NOTE — Progress Notes (Signed)
Subjective:    Patient ID: Shawn Brooks is a 36 y.o. male.  Chief Complaint: HIV Follow-up Visit Shamari FISCHER HALLEY is here for follow-up of HIV infection. He is feeling unchanged since his last visit.  He claims continued adherence to therapy with good tolerance and no complications. There are additional complaints. Complains of recurrent ingrown toenail on the fifth digit of the left foot. Also, recurrent callus formations on the palmar surface of both hands, more on the right than on the left. Requesting determination of disability status. Due to low back pain.  Data Review: Diagnostic studies reviewed.  Review of Systems - General ROS: negative for - fatigue or malaise Psychological ROS: positive for - depression Respiratory ROS: no cough, shortness of breath, or wheezing Cardiovascular ROS: no chest pain or dyspnea on exertion Gastrointestinal ROS: no abdominal pain, change in bowel habits, or black or bloody stools Genito-Urinary ROS: no dysuria, trouble voiding, or hematuria Musculoskeletal ROS: positive for - pain in back - lower Neurological ROS: negative for - memory loss or numbness/tingling Dermatological ROS: See history of present illness  Objective:   General appearance: alert, cooperative and no distress Head: Normocephalic, without obvious abnormality, atraumatic Eyes: conjunctivae/corneas clear. PERRL, EOM's intact. Fundi benign. Throat: lips, mucosa, and tongue normal; teeth and gums normal Resp: clear to auscultation bilaterally Cardio: regular rate and rhythm, S1, S2 normal, no murmur, click, rub or gallop GI: soft, non-tender; bowel sounds normal; no masses,  no organomegaly Extremities: extremities normal, atraumatic, no cyanosis or edema Skin: Skin color, texture, turgor normal. No rashes or lesions or Multiple calluses noted, along flexion points of the digits of both left and right hand, palmar surface. Neurologic: Alert and oriented X 3, normal strength and  tone. Normal symmetric reflexes. Normal coordination and gait Psych:  No vegetative signs or delusional behaviors noted.    Laboratory:  HIV 1 RNA Quant (copies/mL)  Date Value  08/03/2011 125*  05/26/2011 90*  02/03/2011 34*     CD4 T Cell Abs (cmm)  Date Value  08/03/2011 820   05/26/2011 810   02/03/2011 510      CD4 % Helper T Cell (%)  Date Value  08/03/2011 31*  05/26/2011 25*  02/03/2011 26*     Hep A Total Ab (no units)  Date Value  05/06/2010 NEG      Hep B S Ab (no units)  Date Value  05/06/2010 NEG      Hepatitis B Surface Ag (no units)  Date Value  05/06/2010 NEG      HCV Ab (no units)  Date Value  05/06/2010 NEGATIVE        Assessment/Plan:   HIV INFECTION Clinically stable on current regimen. Continue present management.  Counseling provided on prevention of transmission of HIV. Condoms offered:  Yes Medication adherence discussed with patient. Medication refills ordered as needed. Referrals: Podiatrist and dermatologist Follow up visit in 4 months with labs 2 weeks prior to appointment. Patient verbally acknowledged information provided to them and agreed with plan of care.   Callus Unusual presentation to the palmar surfaces of the hand. It is possible that these may also be verruca. Referral to dermatology  Ingrown toenail Recurrent fifth digit, left foot. Referred to podiatrist  Chronic low back pain This is by history alone, and not something that has been previously addressed. I informed him that our clinic, is not equipped to be determining disability for low back pain. However, there are no sources under the [employer] healthcare plan. That would be able to evaluate this as we are aware of. Will discuss this matter with his case manager.      Ronique Simerly A. Sundra Aland, MS, [facility] for Infectious Disease [phone]  08/16/2011, 12:15 PM

## 2011-08-22 ENCOUNTER — Emergency Department (HOSPITAL_COMMUNITY): Payer: Self-pay

## 2011-08-22 ENCOUNTER — Emergency Department (HOSPITAL_COMMUNITY)
Admission: EM | Admit: 2011-08-22 | Discharge: 2011-08-22 | Disposition: A | Discharge status: Home / Self Care | Payer: Self-pay | Attending: Emergency Medicine | Admitting: Emergency Medicine

## 2011-08-22 ENCOUNTER — Encounter (HOSPITAL_COMMUNITY): Payer: Self-pay | Admitting: Emergency Medicine

## 2011-08-22 DIAGNOSIS — W278XXA Contact with other nonpowered hand tool, initial encounter: Secondary | ICD-10-CM | POA: Insufficient documentation

## 2011-08-22 DIAGNOSIS — E785 Hyperlipidemia, unspecified: Secondary | ICD-10-CM | POA: Insufficient documentation

## 2011-08-22 DIAGNOSIS — S61409A Unspecified open wound of unspecified hand, initial encounter: Secondary | ICD-10-CM | POA: Insufficient documentation

## 2011-08-22 DIAGNOSIS — M79609 Pain in unspecified limb: Secondary | ICD-10-CM | POA: Insufficient documentation

## 2011-08-22 DIAGNOSIS — S61439A Puncture wound without foreign body of unspecified hand, initial encounter: Secondary | ICD-10-CM

## 2011-08-22 DIAGNOSIS — R209 Unspecified disturbances of skin sensation: Secondary | ICD-10-CM | POA: Insufficient documentation

## 2011-08-22 DIAGNOSIS — Z21 Asymptomatic human immunodeficiency virus [HIV] infection status: Secondary | ICD-10-CM | POA: Insufficient documentation

## 2011-08-22 MED ORDER — HYDROCODONE-ACETAMINOPHEN 5-325 MG PO TABS: 2.0000 | ORAL_TABLET | ORAL | Status: AC | PRN

## 2011-08-22 MED ORDER — OXYCODONE-ACETAMINOPHEN 5-325 MG PO TABS
1.0000 | ORAL_TABLET | Freq: Once | ORAL | Status: AC
  Administered 2011-08-22: 1 via ORAL
  Filled 2011-08-22: qty 1

## 2011-08-22 MED ORDER — BACITRACIN ZINC 500 UNIT/GM EX OINT
TOPICAL_OINTMENT | CUTANEOUS | Status: AC
  Administered 2011-08-22: 1
  Filled 2011-08-22: qty 0.9

## 2011-08-22 MED ORDER — CEPHALEXIN 500 MG PO CAPS: 500.0000 mg | ORAL_CAPSULE | Freq: Four times a day (QID) | ORAL | Status: AC

## 2011-08-22 NOTE — ED Notes (Signed)
Patient reports trying to move some firewood apart with a "screwdriver" and the instrument slipped and lacerated his left palm approx. 2 hours ago. Small laceration noted to left palm, bleeding controlled with gauze at this time. Patient reports tetanus is up to date.

## 2011-08-22 NOTE — ED Provider Notes (Signed)
Medical screening examination/treatment/procedure(s) were performed by non-physician practitioner and as supervising physician I was immediately available for consultation/collaboration.   Leigh-Ann Ashawna Hanback, MD 08/22/11 0821 

## 2011-08-22 NOTE — ED Provider Notes (Signed)
History     CSN: [id number]  Arrival date & time 08/22/11  0246   First MD Initiated Contact with Patient 08/22/11 [phone]      Chief Complaint  Patient presents with  . Laceration     HPI  History provided by the patient. Patient is a 36 year old male with history of HIV who presents with complaints of puncture wound to his left hand. Patient states that he was trying to split piece of wood using a screwdriver and as he levered to perform the wound he drove a screwdriver into his left hand. Patient complains of pain to the area that radiates up to the forearm area. He reports some slight numbness to the hand. Patient has not taken any medications for symptoms. Patient did poor peroxide over the wound. Bleeding has been controlled with pressure and bandage. He denies any other symptoms at this time. Pain is described as moderate. Pain is aggravated with movements of hands.   Past Medical History  Diagnosis Date  . HIV infection   . Hyperlipidemia     History reviewed. No pertinent past surgical history.  Family History  Problem Relation Age of Onset  . Cancer Mother     laryngeal  . Pneumonia Father   . Diabetes Father   . Hypertension Sister   . Crohn's disease Brother   . Crohn's disease Maternal Grandmother   . Cancer Maternal Grandmother   . Cancer Maternal Grandfather     History  Substance Use Topics  . Smoking status: Current Everyday Smoker -- 0.5 packs/day    Types: Cigarettes  . Smokeless tobacco: Never Used  . Alcohol Use: Yes     beer at times. not often      Review of Systems  All other systems reviewed and are negative.    Allergies  Review of patient's allergies indicates no known allergies.  Home Medications   Current Outpatient Rx  Name Route Sig Dispense Refill  . EMTRICITAB-RILPIVIR-TENOFOVIR 200-25-300 MG PO TABS Oral Take 1 tablet by mouth daily. With food 30 tablet 3  . TADALAFIL 10 MG PO TABS Oral Take 0.5 tablets (5 mg total) by  mouth as needed for erectile dysfunction. 30 tablet 1  . ESCITALOPRAM OXALATE 20 MG PO TABS Oral Take 1 tablet (20 mg total) by mouth daily. 100 tablet 0    PAP from [facility].  #100 tablets, Lo ...    BP 116/77  Pulse 87  Temp(Src) 98.1 F (36.7 C) (Oral)  Resp 16  Wt 214 lb (97.07 kg)  SpO2 97%  Physical Exam  Nursing note and vitals reviewed. Constitutional: He is oriented to person, place, and time. He appears well-developed and well-nourished. No distress.  HENT:  Head: Normocephalic.  Cardiovascular: Normal rate and regular rhythm.   Pulmonary/Chest: Effort normal and breath sounds normal. No respiratory distress.  Musculoskeletal:       5 mm puncture wound to palm of left hand. Reduced range of motion of fingers secondary to pain. Normal strength with flexion extension and digits. Normal cap refill and distal sensations.  Neurological: He is alert and oriented to person, place, and time.  Psychiatric: He has a normal mood and affect. His behavior is normal.    ED Course  Procedures   Dg Hand Complete Left  08/22/2011  *RADIOLOGY REPORT*  Clinical Data: Laceration.  LEFT HAND - COMPLETE 3+ VIEW  Comparison: None  Findings: No acute bony abnormality.  Specifically, no fracture, subluxation, or dislocation.  Soft  tissues are intact.  No radiopaque foreign bodies.  IMPRESSION: No bony abnormality or radiopaque foreign body.  Original Report Authenticated By: Cyndie Chime, M.D.     1. Puncture wound to hand     MDM  3:30 AM patient seen and evaluated. Patient no acute distress.  Wound was cleaned and bandaged by the nurse. X-ray with no significant findings. We'll provide prophylactic antibiotics to prevent infection. Patient also given referral for orthopedic hand specialist.      Angus Seller, PA 08/22/11 [phone]

## 2011-08-22 NOTE — ED Notes (Signed)
Pt states he accidentally stabbed himself in palm with screwdriver

## 2011-09-04 ENCOUNTER — Emergency Department (HOSPITAL_COMMUNITY)
Admission: EM | Admit: 2011-09-04 | Discharge: 2011-09-04 | Discharge status: Against Medical Advice | Payer: Self-pay | Attending: Emergency Medicine | Admitting: Emergency Medicine

## 2011-09-04 DIAGNOSIS — Z0389 Encounter for observation for other suspected diseases and conditions ruled out: Secondary | ICD-10-CM | POA: Insufficient documentation

## 2011-10-07 ENCOUNTER — Other Ambulatory Visit: Payer: Self-pay | Admitting: *Deleted

## 2011-10-07 DIAGNOSIS — F32A Depression, unspecified: Secondary | ICD-10-CM

## 2011-10-07 DIAGNOSIS — B20 Human immunodeficiency virus [HIV] disease: Secondary | ICD-10-CM

## 2011-10-07 DIAGNOSIS — F329 Major depressive disorder, single episode, unspecified: Secondary | ICD-10-CM

## 2011-10-07 MED ORDER — EMTRICITAB-RILPIVIR-TENOFOV DF 200-25-300 MG PO TABS: 1.0000 | ORAL_TABLET | Freq: Every day | ORAL | Status: DC

## 2011-10-07 MED ORDER — ESCITALOPRAM OXALATE 20 MG PO TABS: 20.0000 mg | ORAL_TABLET | Freq: Every day | ORAL | Status: DC

## 2011-10-13 ENCOUNTER — Encounter: Payer: Self-pay | Admitting: *Deleted

## 2011-10-13 NOTE — Progress Notes (Signed)
Patient ID: Shawn Brooks, male   DOB: Mar 25, 1976, 36 y.o.   MRN: [id number]  Pt has appointment with [facility] [phone]) on 10/28/11 @ 9:30am. Partnership for [facility] has sent patient letter in the mail. Tacey Heap RN

## 2011-11-27 ENCOUNTER — Other Ambulatory Visit: Payer: Self-pay | Admitting: Infectious Diseases

## 2011-11-30 ENCOUNTER — Other Ambulatory Visit: Payer: Self-pay

## 2011-12-03 ENCOUNTER — Other Ambulatory Visit: Payer: Self-pay

## 2011-12-03 DIAGNOSIS — B20 Human immunodeficiency virus [HIV] disease: Secondary | ICD-10-CM

## 2011-12-03 DIAGNOSIS — Z113 Encounter for screening for infections with a predominantly sexual mode of transmission: Secondary | ICD-10-CM

## 2011-12-03 DIAGNOSIS — Z79899 Other long term (current) drug therapy: Secondary | ICD-10-CM

## 2011-12-03 LAB — COMPLETE METABOLIC PANEL WITH GFR
AST: 19 U/L (ref 0–37)
BUN: 12 mg/dL (ref 6–23)
Calcium: 9.2 mg/dL (ref 8.4–10.5)
Chloride: 106 mEq/L (ref 96–112)
Creat: 1.29 mg/dL (ref 0.50–1.35)
GFR, Est African American: 81 mL/min
Total Bilirubin: 0.3 mg/dL (ref 0.3–1.2)

## 2011-12-03 LAB — T-HELPER CELL (CD4) - (RCID CLINIC ONLY): CD4 % Helper T Cell: 29 % — ABNORMAL LOW (ref 33–55)

## 2011-12-03 LAB — LIPID PANEL
Cholesterol: 157 mg/dL (ref 0–200)
HDL: 41 mg/dL (ref >39)
Triglycerides: 67 mg/dL (ref <150)
VLDL: 13 mg/dL (ref 0–40)

## 2011-12-03 LAB — CBC
HCT: 44.9 % (ref 39.0–52.0)
MCH: 31.1 pg (ref 26.0–34.0)
MCV: 87.4 fL (ref 78.0–100.0)
Platelets: 195 10*3/uL (ref 150–400)
RDW: 14.2 % (ref 11.5–15.5)
WBC: 8 10*3/uL (ref 4.0–10.5)

## 2011-12-09 ENCOUNTER — Other Ambulatory Visit: Payer: Self-pay

## 2011-12-14 ENCOUNTER — Ambulatory Visit: Payer: Self-pay | Admitting: Adult Health

## 2011-12-16 ENCOUNTER — Encounter: Payer: Self-pay | Admitting: Internal Medicine

## 2011-12-16 ENCOUNTER — Ambulatory Visit (INDEPENDENT_AMBULATORY_CARE_PROVIDER_SITE_OTHER): Payer: Self-pay | Admitting: Internal Medicine

## 2011-12-16 VITALS — BP 135/75 | HR 71 | Temp 97.9°F | Ht 69.0 in | Wt 204.8 lb

## 2011-12-16 DIAGNOSIS — B20 Human immunodeficiency virus [HIV] disease: Secondary | ICD-10-CM

## 2011-12-16 DIAGNOSIS — Z23 Encounter for immunization: Secondary | ICD-10-CM

## 2011-12-16 DIAGNOSIS — Z0000 Encounter for general adult medical examination without abnormal findings: Secondary | ICD-10-CM

## 2011-12-16 DIAGNOSIS — M76899 Other specified enthesopathies of unspecified lower limb, excluding foot: Secondary | ICD-10-CM

## 2011-12-16 MED ORDER — EMTRICITABINE-TENOFOVIR DF 200-300 MG PO TABS: 1.0000 | ORAL_TABLET | Freq: Every day | ORAL | Status: DC

## 2011-12-16 MED ORDER — DARUNAVIR ETHANOLATE 800 MG PO TABS: 800.0000 mg | ORAL_TABLET | Freq: Every day | ORAL | Status: DC

## 2011-12-16 MED ORDER — RITONAVIR 100 MG PO TABS: 100.0000 mg | ORAL_TABLET | Freq: Every day | ORAL | Status: DC

## 2011-12-16 NOTE — Assessment & Plan Note (Signed)
He is doing well with his regimen as far as having a nearly undetectable viral load. However he is having difficulty with the diet required for Complera. I have discussed with him taking a separate once a day regimen that is easier to take in the form of a protease inhibitor with Truvada. He is interested in that and I will have him change to resist, Norvir and Truvada. I will have him repeat the labs in one month following the regimen change and then followup with me 2 weeks later. He was reminded to use condoms with all sexual activity.

## 2011-12-16 NOTE — Assessment & Plan Note (Signed)
He does have significant musculoskeletal complaints. I have offered physical therapy however this will not be covered by the [facility] card. I will have him referred to primary physician that may be able to better address some of them out musculoskeletal issues. He is pleased to have the option of being referred to primary physician and will be referred to the internal medicine clinic. Additional things may include monitoring his blood pressure.

## 2011-12-16 NOTE — Progress Notes (Signed)
  Subjective:    Patient ID: Shawn Brooks, male    DOB: 06-Sep-1975, 36 y.o.   MRN: [id number]  HPI Here for follow up of HIV.  He has been on 2 different regimens recently including first Truvada with Isentress and then this was changed to Complera for the once a day dosing. He comes in today though having difficulty with the diet requirements of Complera. He is able to take it and has not missed any doses but does occasionally forget to take it soon after eating. He then gets significant diarrhea. He though has had no significant other side effects. Overall he does feel better though. He also does have some complaints of his musculoskeletal system. This includes some muscle and "nervelike" pain in his lower right extremity in his usual back pain. He also is still waiting for referral to podiatry for his ingrown toenail. He has been seen by dermatology for his callus.   Review of Systems  Constitutional: Negative for fever, chills, appetite change and unexpected weight change.  HENT: Negative for sore throat and trouble swallowing.   Respiratory: Negative for cough and shortness of breath.   Cardiovascular: Negative for chest pain, palpitations and leg swelling.  Gastrointestinal: Negative for nausea, abdominal pain and diarrhea.  Musculoskeletal: Positive for back pain. Negative for myalgias, joint swelling, arthralgias and gait problem.  Skin: Negative for rash.  Neurological: Negative for dizziness, weakness and headaches.  Hematological: Negative for adenopathy.  Psychiatric/Behavioral: Negative for dysphoric mood. The patient is not nervous/anxious.        Objective:   Physical Exam  Constitutional: He appears well-developed and well-nourished. No distress.  HENT:  Mouth/Throat: Oropharynx is clear and moist. No oropharyngeal exudate.  Cardiovascular: Normal rate, regular rhythm and normal heart sounds.  Exam reveals no gallop and no friction rub.   No murmur  heard. Pulmonary/Chest: Effort normal and breath sounds normal. No respiratory distress. He has no wheezes. He has no rales.  Abdominal: Soft. Bowel sounds are normal. He exhibits no distension. There is no tenderness. There is no rebound.  Lymphadenopathy:    He has no cervical adenopathy.  Skin: Skin is warm and dry. No rash noted.          Assessment & Plan:

## 2011-12-23 ENCOUNTER — Encounter: Payer: Self-pay | Admitting: *Deleted

## 2011-12-23 NOTE — Progress Notes (Signed)
Patient ID: Shawn Brooks, male   DOB: [birth year], 36 y.o.   MRN: [id number]  Pt has [facility] card. I will fax referral to [facility] and send request to [place] at [facility] for a PCP visit. Thanks - Tacey Heap RN

## 2012-01-10 ENCOUNTER — Ambulatory Visit: Payer: Self-pay | Attending: Internal Medicine | Admitting: Physical Therapy

## 2012-01-10 DIAGNOSIS — M2560 Stiffness of unspecified joint, not elsewhere classified: Secondary | ICD-10-CM | POA: Insufficient documentation

## 2012-01-10 DIAGNOSIS — IMO0001 Reserved for inherently not codable concepts without codable children: Secondary | ICD-10-CM | POA: Insufficient documentation

## 2012-01-10 DIAGNOSIS — M545 Low back pain, unspecified: Secondary | ICD-10-CM | POA: Insufficient documentation

## 2012-01-10 DIAGNOSIS — M2550 Pain in unspecified joint: Secondary | ICD-10-CM | POA: Insufficient documentation

## 2012-01-11 ENCOUNTER — Ambulatory Visit: Payer: Self-pay | Admitting: Physical Therapy

## 2012-01-18 ENCOUNTER — Other Ambulatory Visit: Payer: Self-pay

## 2012-01-19 ENCOUNTER — Encounter: Payer: Self-pay | Admitting: Rehabilitation

## 2012-01-21 ENCOUNTER — Encounter: Payer: Self-pay | Admitting: Rehabilitation

## 2012-01-25 ENCOUNTER — Encounter: Payer: Self-pay | Admitting: Physical Therapy

## 2012-01-25 ENCOUNTER — Other Ambulatory Visit: Payer: Self-pay

## 2012-01-27 ENCOUNTER — Encounter: Payer: Self-pay | Admitting: Physical Therapy

## 2012-01-27 ENCOUNTER — Other Ambulatory Visit (HOSPITAL_COMMUNITY)
Admission: RE | Admit: 2012-01-27 | Discharge: 2012-01-27 | Disposition: A | Discharge status: Home / Self Care | Payer: Medicaid Other | Source: Ambulatory Visit | Attending: Internal Medicine | Admitting: Internal Medicine

## 2012-01-27 ENCOUNTER — Other Ambulatory Visit: Payer: Self-pay

## 2012-01-27 DIAGNOSIS — B20 Human immunodeficiency virus [HIV] disease: Secondary | ICD-10-CM

## 2012-01-27 DIAGNOSIS — Z113 Encounter for screening for infections with a predominantly sexual mode of transmission: Secondary | ICD-10-CM | POA: Insufficient documentation

## 2012-01-28 LAB — CBC WITH DIFFERENTIAL/PLATELET
Hemoglobin: 15.4 g/dL (ref 13.0–17.0)
Lymphocytes Relative: 33 % (ref 12–46)
Lymphs Abs: 2.5 10*3/uL (ref 0.7–4.0)
Neutrophils Relative %: 60 % (ref 43–77)
Platelets: 181 10*3/uL (ref 150–400)
RBC: 4.99 MIL/uL (ref 4.22–5.81)
WBC: 7.6 10*3/uL (ref 4.0–10.5)

## 2012-01-28 LAB — COMPLETE METABOLIC PANEL WITH GFR
ALT: 12 U/L (ref 0–53)
Albumin: 4.2 g/dL (ref 3.5–5.2)
CO2: 27 mEq/L (ref 19–32)
Calcium: 9.7 mg/dL (ref 8.4–10.5)
Chloride: 105 mEq/L (ref 96–112)
GFR, Est African American: 79 mL/min
Potassium: 4.3 mEq/L (ref 3.5–5.3)
Sodium: 138 mEq/L (ref 135–145)
Total Protein: 7.2 g/dL (ref 6.0–8.3)

## 2012-01-28 LAB — T-HELPER CELL (CD4) - (RCID CLINIC ONLY): CD4 % Helper T Cell: 32 % — ABNORMAL LOW (ref 33–55)

## 2012-01-31 ENCOUNTER — Encounter: Payer: Self-pay | Admitting: Physical Therapy

## 2012-01-31 LAB — HIV-1 RNA QUANT-NO REFLEX-BLD: HIV 1 RNA Quant: 38 copies/mL — ABNORMAL HIGH (ref <20)

## 2012-02-01 ENCOUNTER — Ambulatory Visit: Payer: Medicaid Other | Admitting: Internal Medicine

## 2012-02-03 ENCOUNTER — Encounter: Payer: Self-pay | Admitting: Rehabilitation

## 2012-02-08 ENCOUNTER — Ambulatory Visit: Payer: Self-pay | Admitting: Internal Medicine

## 2012-02-08 ENCOUNTER — Ambulatory Visit: Payer: Medicaid Other | Attending: Internal Medicine | Admitting: Rehabilitation

## 2012-02-08 DIAGNOSIS — M2560 Stiffness of unspecified joint, not elsewhere classified: Secondary | ICD-10-CM | POA: Insufficient documentation

## 2012-02-08 DIAGNOSIS — M2550 Pain in unspecified joint: Secondary | ICD-10-CM | POA: Insufficient documentation

## 2012-02-08 DIAGNOSIS — M545 Low back pain, unspecified: Secondary | ICD-10-CM | POA: Insufficient documentation

## 2012-02-08 DIAGNOSIS — IMO0001 Reserved for inherently not codable concepts without codable children: Secondary | ICD-10-CM | POA: Insufficient documentation

## 2012-02-10 ENCOUNTER — Encounter: Payer: Self-pay | Admitting: Internal Medicine

## 2012-02-10 ENCOUNTER — Ambulatory Visit: Payer: Medicaid Other | Admitting: Rehabilitation

## 2012-02-10 ENCOUNTER — Ambulatory Visit (INDEPENDENT_AMBULATORY_CARE_PROVIDER_SITE_OTHER): Payer: Self-pay | Admitting: Internal Medicine

## 2012-02-10 VITALS — BP 117/71 | HR 80 | Temp 98.2°F | Ht 68.0 in | Wt 210.0 lb

## 2012-02-10 DIAGNOSIS — G8929 Other chronic pain: Secondary | ICD-10-CM

## 2012-02-10 DIAGNOSIS — B20 Human immunodeficiency virus [HIV] disease: Secondary | ICD-10-CM

## 2012-02-10 DIAGNOSIS — M545 Low back pain, unspecified: Secondary | ICD-10-CM

## 2012-02-10 MED ORDER — OMEPRAZOLE 20 MG PO CPDR: 20.0000 mg | DELAYED_RELEASE_CAPSULE | Freq: Every day | ORAL | Status: DC

## 2012-02-11 NOTE — Assessment & Plan Note (Signed)
Referral to PCP

## 2012-02-11 NOTE — Assessment & Plan Note (Signed)
Doing well and will continue with same regimen.  Reminded to use condoms.  RTC 3 months

## 2012-02-11 NOTE — Progress Notes (Signed)
  Subjective:    Patient ID: Shawn Brooks, male    DOB: February 01, 1976, 36 y.o.   MRN: [id number]  HPI Here for folllow up after changing his regimen to Truvada with boosted Prezista.  Previously he was on Isentress and Truvada then Complera but had difficulty with the diet requirements and was changed to his current regimen.  He is tolerating it well with no side effects.  Has good compliance with no missed doses.  He continues to complain of back pain and is in rehab.    Review of Systems  Constitutional: Negative for fever, chills and fatigue.  HENT: Negative for sore throat and trouble swallowing.   Respiratory: Negative for cough and shortness of breath.   Cardiovascular: Negative for chest pain, palpitations and leg swelling.  Gastrointestinal: Negative for nausea, abdominal pain, diarrhea and constipation.  Musculoskeletal: Positive for back pain. Negative for myalgias, joint swelling, arthralgias and gait problem.  Skin: Negative for rash.  Neurological: Negative for dizziness and numbness.  Hematological: Negative for adenopathy.       Objective:   Physical Exam  Constitutional: He appears well-developed and well-nourished. No distress.  HENT:  Mouth/Throat: Oropharynx is clear and moist. No oropharyngeal exudate.  Cardiovascular: Normal rate, regular rhythm and normal heart sounds.  Exam reveals no gallop and no friction rub.   No murmur heard. Pulmonary/Chest: Effort normal and breath sounds normal. No respiratory distress. He has no wheezes. He has no rales.  Abdominal: Soft. Bowel sounds are normal. He exhibits no distension. There is no tenderness. There is no rebound.          Assessment & Plan:

## 2012-02-14 ENCOUNTER — Encounter: Payer: Self-pay | Admitting: Physical Therapy

## 2012-02-16 ENCOUNTER — Encounter: Payer: Self-pay | Admitting: Rehabilitation

## 2012-02-27 ENCOUNTER — Emergency Department (HOSPITAL_COMMUNITY): Payer: Self-pay

## 2012-02-27 ENCOUNTER — Encounter (HOSPITAL_COMMUNITY): Payer: Self-pay | Admitting: Emergency Medicine

## 2012-02-27 ENCOUNTER — Emergency Department (HOSPITAL_COMMUNITY)
Admission: EM | Admit: 2012-02-27 | Discharge: 2012-02-27 | Disposition: A | Discharge status: Home / Self Care | Payer: Self-pay | Attending: Emergency Medicine | Admitting: Emergency Medicine

## 2012-02-27 DIAGNOSIS — M25569 Pain in unspecified knee: Secondary | ICD-10-CM | POA: Insufficient documentation

## 2012-02-27 DIAGNOSIS — E785 Hyperlipidemia, unspecified: Secondary | ICD-10-CM | POA: Insufficient documentation

## 2012-02-27 DIAGNOSIS — A64 Unspecified sexually transmitted disease: Secondary | ICD-10-CM | POA: Insufficient documentation

## 2012-02-27 DIAGNOSIS — R369 Urethral discharge, unspecified: Secondary | ICD-10-CM | POA: Insufficient documentation

## 2012-02-27 DIAGNOSIS — K5900 Constipation, unspecified: Secondary | ICD-10-CM | POA: Insufficient documentation

## 2012-02-27 DIAGNOSIS — M25561 Pain in right knee: Secondary | ICD-10-CM

## 2012-02-27 LAB — URINALYSIS, ROUTINE W REFLEX MICROSCOPIC
Bilirubin Urine: NEGATIVE
Nitrite: NEGATIVE
Specific Gravity, Urine: 1.024 (ref 1.005–1.030)
pH: 7 (ref 5.0–8.0)

## 2012-02-27 LAB — URINE MICROSCOPIC-ADD ON

## 2012-02-27 MED ORDER — AZITHROMYCIN 1 G PO PACK
1.0000 g | PACK | Freq: Once | ORAL | Status: AC
  Administered 2012-02-27: 1 g via ORAL
  Filled 2012-02-27: qty 1

## 2012-02-27 MED ORDER — CEFTRIAXONE SODIUM 250 MG IJ SOLR
250.0000 mg | Freq: Once | INTRAMUSCULAR | Status: AC
  Administered 2012-02-27: 250 mg via INTRAMUSCULAR
  Filled 2012-02-27: qty 250

## 2012-02-27 MED ORDER — NAPROXEN 500 MG PO TABS: 500.0000 mg | ORAL_TABLET | Freq: Two times a day (BID) | ORAL | Status: DC

## 2012-02-27 MED ORDER — SULFAMETHOXAZOLE-TRIMETHOPRIM 800-160 MG PO TABS: 1.0000 | ORAL_TABLET | Freq: Two times a day (BID) | ORAL | Status: AC

## 2012-02-27 NOTE — ED Notes (Signed)
Pt reports two episodes of penile discharge after having unprotected sex with a partner. Pt denies partner telling him she was diagnosed with an STD.

## 2012-02-27 NOTE — ED Notes (Signed)
Presents w/ penil discharge onset one week ago, thought it went away but then noticed it again last noc. Pt is having right knee and right foot pain as well. Also indicates difficulty having a BM since the onset of this penile discharge.

## 2012-02-27 NOTE — ED Provider Notes (Signed)
History     CSN: [id number]  Arrival date & time 02/27/12  4098   First MD Initiated Contact with Patient 02/27/12 1138      Chief Complaint  Patient presents with  . SEXUALLY TRANSMITTED DISEASE    (Consider location/radiation/quality/duration/timing/severity/associated sxs/prior treatment) The history is provided by the patient and medical records.   Shawn Brooks is a 36 y.o. male presents to the emergency department complaining of penile discharge.  The onset of the symptoms was  abrupt starting 1 weeks ago.  The patient has associated constipation, .  The symptoms have been  intermittent, stabilized.  nothing makes the symptoms worse and nothing makes symptoms better.  The patient denies fever, chills, headache, neck pain, back pain, chest pain, shortness of breath, dysuria, hematuria.  Pt is sexually active and is not routinely used protection.  Contact with a partner with an unknown STI approx 1.5 weeks ago.  Pt thinks it was Gonorrhea.  Patient also complaining of difficulty with bowel movements stating that it is painful and he is only up to go a little bit of time.   He states the pain is relieved once he is desiccated. He denies hematochezia, melena, hemorrhoids.  Also c/o pain in his right knee and foot that began last night.  It is described as sharp, non-radiating and rated at a 6/10.  Pt denies injury or trauma.  He states he's never had pain in his leg like this before.    Past Medical History  Diagnosis Date  . HIV infection   . Hyperlipidemia     History reviewed. No pertinent past surgical history.  Family History  Problem Relation Age of Onset  . Cancer Mother     laryngeal  . Pneumonia Father   . Diabetes Father   . Hypertension Sister   . Crohn's disease Brother   . Crohn's disease Maternal Grandmother   . Cancer Maternal Grandmother   . Cancer Maternal Grandfather     History  Substance Use Topics  . Smoking status: Current Everyday Smoker -- 0.5  packs/day    Types: Cigarettes  . Smokeless tobacco: Never Used  . Alcohol Use: 3.5 oz/week    7 drink(s) per week     beer at times. not often      Review of Systems  Constitutional: Negative for fever, diaphoresis, appetite change and fatigue.  HENT: Negative for mouth sores and neck stiffness.   Eyes: Negative for visual disturbance.  Respiratory: Negative for cough, chest tightness, shortness of breath and wheezing.   Cardiovascular: Negative for chest pain.  Gastrointestinal: Positive for constipation. Negative for nausea, vomiting, abdominal pain and diarrhea.  Genitourinary: Positive for discharge, penile pain and testicular pain. Negative for dysuria, urgency, frequency, hematuria, penile swelling and scrotal swelling.  Skin: Negative for rash.  Neurological: Negative for syncope, light-headedness and headaches.  Psychiatric/Behavioral: Negative for disturbed wake/sleep cycle. The patient is not nervous/anxious.     Allergies  Review of patient's allergies indicates no known allergies.  Home Medications   Current Outpatient Rx  Name Route Sig Dispense Refill  . DARUNAVIR ETHANOLATE 800 MG PO TABS Oral Take 1 tablet (800 mg total) by mouth daily. 30 tablet 11  . EMTRICITABINE-TENOFOVIR 200-300 MG PO TABS Oral Take 1 tablet by mouth daily. 30 tablet 11  . ESCITALOPRAM OXALATE 20 MG PO TABS  TAKE 1 TABLET BY MOUTH EVERY DAY 30 tablet 1  . GABAPENTIN 300 MG PO CAPS Oral Take 300 mg by mouth  4 (four) times daily.    . IBUPROFEN 200 MG PO TABS Oral Take 800 mg by mouth every 8 (eight) hours as needed. For pain.    Marland Kitchen OMEPRAZOLE 20 MG PO CPDR Oral Take 1 capsule (20 mg total) by mouth daily. 30 capsule 11  . RITONAVIR 100 MG PO TABS Oral Take 1 tablet (100 mg total) by mouth daily. 30 tablet 11  . VALACYCLOVIR HCL 500 MG PO TABS Oral Take 500 mg by mouth 2 (two) times daily as needed. For flare-ups.      BP 110/69  Pulse 67  Temp 97.9 F (36.6 C) (Oral)  SpO2  96%  Physical Exam  Nursing note and vitals reviewed. Constitutional: He appears well-developed and well-nourished. No distress.  HENT:  Head: Normocephalic and atraumatic.  Mouth/Throat: Oropharynx is clear and moist. No oropharyngeal exudate.  Eyes: Conjunctivae are normal. No scleral icterus.  Neck: Normal range of motion. Neck supple.  Cardiovascular: Normal rate, regular rhythm, normal heart sounds and intact distal pulses.  Exam reveals no gallop and no friction rub.   No murmur heard. Pulmonary/Chest: Effort normal and breath sounds normal. No respiratory distress. He has no wheezes.  Abdominal: Soft. Bowel sounds are normal. He exhibits no mass. There is no tenderness. There is no rebound and no guarding.  Genitourinary: Rectum normal and testes normal. Rectal exam shows no external hemorrhoid and no fissure. Prostate is tender (And boggy ). Circumcised. No penile erythema or penile tenderness. Discharge (milky white, scant) found.  Musculoskeletal: Normal range of motion. He exhibits no edema.       Right knee: Full range of motion with pain Right foot/ankle: Range of motion without pain  Neurological: He is alert.       Sensation of bilateral lower extremities intact Strength of Bilateral lower extremities normal  Skin: Skin is warm and dry. He is not diaphoretic.       Right knee: Nonerythematous, nonswollen, not warm to the touch  Psychiatric: He has a normal mood and affect.    ED Course  Procedures (including critical care time)  Labs Reviewed - No data to display No results found.  Results for orders placed during the hospital encounter of 02/27/12  URINALYSIS, ROUTINE W REFLEX MICROSCOPIC      Component Value Range   Color, Urine YELLOW  YELLOW   APPearance CLEAR  CLEAR   Specific Gravity, Urine 1.024  1.005 - 1.030   pH 7.0  5.0 - 8.0   Glucose, UA NEGATIVE  NEGATIVE mg/dL   Hgb urine dipstick NEGATIVE  NEGATIVE   Bilirubin Urine NEGATIVE  NEGATIVE    Ketones, ur NEGATIVE  NEGATIVE mg/dL   Protein, ur NEGATIVE  NEGATIVE mg/dL   Urobilinogen, UA 0.2  0.0 - 1.0 mg/dL   Nitrite NEGATIVE  NEGATIVE   Leukocytes, UA TRACE (*) NEGATIVE  URINE MICROSCOPIC-ADD ON      Component Value Range   Squamous Epithelial / LPF RARE  RARE   WBC, UA 0-2  <3 WBC/hpf   Dg Knee Complete 4 Views Right  02/27/2012  *RADIOLOGY REPORT*  Clinical Data: Pain and decreased range of motion.  RIGHT KNEE - COMPLETE 4+ VIEW  Comparison: None.  Findings: No effusion. Negative for fracture, dislocation, or other acute abnormality.  Normal alignment and mineralization. No significant degenerative change.  Regional soft tissues unremarkable.  IMPRESSION:  Negative   Original Report Authenticated By: Osa Craver, M.D.      No diagnosis found.  MDM  Ahmed Herminio Commons presents with penile discharge, difficulty defecating and right knee pain.  Patient STI exposure, no G. White discharge on exam and tender prostate. Concern for gonorrhea/Chlamydia possible prostatitis.  Patient is young, healthy nontoxic, nonseptic appearing. Patient is afebrile nontoxic her cardiac nonhypertensive.  Comfortable treating this patient on outpatient basis.  Patient is done 250 mg of Rocephin IM and 1 g azithromycin by mouth.  I will discharge home with Bactrim.  Patient also complaining of right knee pain.  Patient is neurovascularly intact we'll treat with NSAIDs and conservative treatment.  There is no indication for septic joint of the right knee.  I've her I discussed the patient indications to return if the joint becomes swollen, erythematous, hot to the touch or if he developed a fever. We'll give a knee sleeve.  Discussed these findings with the patient as well as safe sex habits.  Discussed importance of using protection when sexually active. Pt understands that they have GC/Chlamydia cultures pending and that they will need to inform all sexual partners if results return positive. Pt has  been treated prophylacticly with azithromycin and rocephin due to pts history, gential exam.    1. Medications: Bactrim 2. Treatment: Rest, ice, elevation, compression of right knee, take antibiotics as directed, take NSAIDs as directed 3. Follow Up: With primary care or emergency department if discharge persists. With orthopedics if pain in right knee persists  You have been treated in the emergency department for an infection, possibly sexually transmitted. Results of your gonorrhea and chlamydia tests are pending and you will be notified if they are positive. It is very important to practice safe sex and use condoms when sexually active. If your results are positive you need to notify all sexual partners so they can be treated as well. The website [email or web address].net/ can be used to send anonymous text messages or emails to alert sexual contacts. Follow up with your doctor, or OBGYN in regards to today's visit.    Gonorrhea and Chlamydia SYMPTOMS  In females, symptoms may go unnoticed. Symptoms that are more noticeable can include:  Belly (abdominal) pain.  Painful intercourse.  Watery mucous-like discharge from the vagina.  Miscarriage.  Discomfort when urinating.  Inflammation of the rectum.  Abnormal gray-green frothy vaginal discharge  Vaginal itching and irritatio  Itching and irritation of the area outside the vagina.   Painful urination.  Bleeding after sexual intercourse.  In males, symptoms include:  Burning with urination.  Pain in the testicles.  Watery mucous-like discharge from the penis.  It can cause longstanding (chronic) pelvic pain after frequent infections.  TREATMENT  PID can cause women to not be able to have children (sterile) if left untreated or if half-treated.  It is important to finish ALL medications given to you.  This is a sexually transmitted infection. So you are also at risk for other sexually transmitted diseases, including HIV (AIDS), it is  recommended that you get tested. HOME CARE INSTRUCTIONS  Warning: This infection is contagious. Do not have sex until treatment is completed. Follow up at your caregiver's office or the clinic to which you were referred. If your diagnosis (learning what is wrong) is confirmed by culture or some other method, your recent sexual contacts need treatment. Even if they are symptom free or have a negative culture or evaluation, they should be treated.  PREVENTION  Women should use sanitary pads instead of tampons for vaginal discharge.  Wipe front to back after using the toilet  and avoid douching.   Practice safe sex, use condoms, have only one sex partner and be sure your sex partner is not having sex with others.  Ask your caregiver to test you for chlamydia at your regular checkups or sooner if you are having symptoms.  Ask for further information if you are pregnant.  SEEK IMMEDIATE MEDICAL CARE IF:  You develop an oral temperature above 102 F (38.9 C), not controlled by medications or lasting more than 2 days.  You develop an increase in pain.  You develop any type of abnormal discharge.  You develop vaginal bleeding and it is not time for your period.  You develop painful intercourse.    RESOURCE GUIDE  Dental Problems  Patients with Medicaid: [facility] [phone] [address]                                           [phone] [address] Phone:  [phone]                                                  Phone:  [phone]  If unable to pay or uninsured, contact:  [facility] or [facility]. to become qualified for the adult dental clinic.  Chronic Pain Problems Contact [facility]  [phone] Patients need to be referred by their primary care doctor.  Insufficient Money for Medicine Contact [employer] Way:  call "211" or [facility] 650-634-8176.  No Primary Care Doctor Call [facility]   [phone] Other agencies that provide inexpensive medical care    [facility]  [phone]    [facility]  [phone]    [facility]  [phone]    [facility]  [phone]    [facility]  [phone]    [facility]  [phone]  Psychological Services [facility]  [phone] [facility]  [phone] [facility]   [phone] (emergency services [phone])  Substance Abuse Resources [facility]  [phone] [facility] [phone] [place] [phone] Floydene Flock [phone] Residential & Outpatient Substance Abuse Program  8623327245  Abuse/Neglect [facility] Child Abuse Hotline [phone] [facility] Child Abuse Hotline [phone] (After Hours)  Emergency Shelter [facility] [phone]  Maternity Homes Room at the [place] [facility] [phone] [facility] [phone]  MRSA Hotline #:   916-022-6644    [facility] Resources  [facility]     [employer]                          [facility] Dept. [address] [place]                       [address]      [address]  [address]  Cristobal Goldmann Phone:  [phone]                                   Phone:  [phone]                 Phone:  [phone]  [facility] Phone:  [phone]  [facility] Child Abuse Hotline 8545676342 [phone] (After Hours)            Dierdre Forth, PA-C 02/27/12 1458

## 2012-02-28 LAB — URINE CULTURE
Colony Count: NO GROWTH
Culture: NO GROWTH

## 2012-02-28 NOTE — ED Provider Notes (Signed)
Medical screening examination/treatment/procedure(s) were performed by non-physician practitioner and as supervising physician I was immediately available for consultation/collaboration.  Cherell Colvin, MD 02/28/12 0659 

## 2012-02-29 LAB — GC/CHLAMYDIA PROBE AMP, GENITAL: Chlamydia, DNA Probe: NEGATIVE

## 2012-03-09 ENCOUNTER — Encounter: Payer: Self-pay | Admitting: Internal Medicine

## 2012-03-09 ENCOUNTER — Ambulatory Visit (INDEPENDENT_AMBULATORY_CARE_PROVIDER_SITE_OTHER): Payer: Self-pay | Admitting: Internal Medicine

## 2012-03-09 ENCOUNTER — Telehealth: Payer: Self-pay | Admitting: *Deleted

## 2012-03-09 VITALS — BP 109/70 | HR 82 | Temp 98.3°F | Ht 68.0 in | Wt 207.0 lb

## 2012-03-09 DIAGNOSIS — K5900 Constipation, unspecified: Secondary | ICD-10-CM | POA: Insufficient documentation

## 2012-03-09 MED ORDER — CIPROFLOXACIN HCL 500 MG PO TABS: 500.0000 mg | ORAL_TABLET | Freq: Two times a day (BID) | ORAL | Status: AC

## 2012-03-09 MED ORDER — POLYETHYLENE GLYCOL 3350 17 GM/SCOOP PO POWD: 17.0000 g | Freq: Every day | ORAL | Status: AC

## 2012-03-09 NOTE — Assessment & Plan Note (Signed)
I have recommended she try some MiraLax for his constipation. I also will give him a prescription for Cipro to see if there is any benefit or if there is any proctitis. His exam is benign in their note anal lesions or genital lesions. I will give him the Cipro as it is any benefit.  He will return in one week.

## 2012-03-09 NOTE — Telephone Encounter (Signed)
Patient called today and advised that he is still having drainage from his penis after her was treated for GC last week at the [facility]. He is concerned because he has never had this happen after being treated. He advised that his partner does not have any symptoms and just want to be seen by a doctor if possible. Advised him we have a 1000 am with Dr Luciana Axe and he is able to be here.

## 2012-03-09 NOTE — Progress Notes (Signed)
  Subjective:    Patient ID: Shawn Brooks, male    DOB: [birth year], 36 y.o.   MRN: [id number]  HPI He comes in today for a work in visit. He recently went to the emergency room due to penile discharge and had workup including gonorrhea and Chlamydia test as well as receiving IM Rocephin, 1 g of azithromycin and a prescription for Bactrim for concerns of prostatitis. Initially he stated that he had some continued penile discharge no further in the interview he said that it had essentially resolved. His main complaint actually is some constipation and he states some mucus that he has with little stool. He has had no fever or chills.  Work up was negative.    Review of Systems  Constitutional: Negative for fever and fatigue.  HENT: Negative for sore throat and trouble swallowing.   Respiratory: Negative for cough and shortness of breath.   Cardiovascular: Negative for chest pain, palpitations and leg swelling.  Gastrointestinal: Negative for nausea, abdominal pain and diarrhea.  Musculoskeletal: Negative for myalgias, joint swelling and arthralgias.  Neurological: Negative for dizziness, light-headedness and headaches.       Objective:   Physical Exam  Constitutional: He appears well-developed and well-nourished. No distress.  HENT:  Mouth/Throat: Oropharynx is clear and moist. No oropharyngeal exudate.  Cardiovascular: Normal rate, regular rhythm and normal heart sounds.  Exam reveals no gallop and no friction rub.   No murmur heard. Pulmonary/Chest: Effort normal and breath sounds normal. No respiratory distress. He has no wheezes. He has no rales.  Abdominal: Soft. Bowel sounds are normal. He exhibits no distension. There is no tenderness. There is no rebound.  Lymphadenopathy:    He has no cervical adenopathy.          Assessment & Plan:

## 2012-03-16 ENCOUNTER — Encounter: Payer: Self-pay | Admitting: Internal Medicine

## 2012-03-16 ENCOUNTER — Ambulatory Visit (INDEPENDENT_AMBULATORY_CARE_PROVIDER_SITE_OTHER): Payer: Self-pay | Admitting: Internal Medicine

## 2012-03-16 VITALS — BP 108/69 | HR 96 | Temp 98.2°F | Ht 68.0 in | Wt 208.0 lb

## 2012-03-16 DIAGNOSIS — K5900 Constipation, unspecified: Secondary | ICD-10-CM

## 2012-03-16 DIAGNOSIS — B20 Human immunodeficiency virus [HIV] disease: Secondary | ICD-10-CM

## 2012-03-16 DIAGNOSIS — M545 Low back pain: Secondary | ICD-10-CM

## 2012-03-16 DIAGNOSIS — G8929 Other chronic pain: Secondary | ICD-10-CM

## 2012-03-16 NOTE — Assessment & Plan Note (Signed)
This has resolved. He does have some continued crampy abdominal pain is related to the Cipro and the constipation and therapy that he got over-the-counter. He knows to call if he has significant weight loss or worsening of this.

## 2012-03-16 NOTE — Progress Notes (Signed)
  Subjective:    Patient ID: Shawn Brooks, male    DOB: 01/16/1976, 36 y.o.   MRN: [id number]  HPI He comes in for a followup of his complaint of constipation in penile discharge. He is no longer having penile discharge and his constipation has resolved. He did get some over-the-counter constipation medicine and he did take a course of Cipro in case he did have some resistant sexually transmitted infections. He continues to have numerous other complaints including joint aches and crampy abdominal pain but is not having any weight loss and continues to take normal p.o. Intake.   Review of Systems  Constitutional: Negative for fever, activity change, appetite change, fatigue and unexpected weight change.  Gastrointestinal: Positive for abdominal pain. Negative for nausea, diarrhea and constipation.  Musculoskeletal: Positive for arthralgias. Negative for myalgias and joint swelling.       Objective:   Physical Exam  Constitutional: He appears well-developed and well-nourished. No distress.  Cardiovascular: Normal rate, regular rhythm and normal heart sounds.  Exam reveals no gallop and no friction rub.   No murmur heard. Abdominal: Soft. Bowel sounds are normal. He exhibits no distension. There is no tenderness.          Assessment & Plan:

## 2012-03-16 NOTE — Assessment & Plan Note (Signed)
He continues to have back and joint pain. Unfortunately he has missed several appointments with sports medicine. He is going to call their office to see if he can get back in.

## 2012-03-17 ENCOUNTER — Other Ambulatory Visit: Payer: Self-pay | Admitting: Internal Medicine

## 2012-03-17 ENCOUNTER — Telehealth: Payer: Self-pay | Admitting: *Deleted

## 2012-03-17 DIAGNOSIS — R109 Unspecified abdominal pain: Secondary | ICD-10-CM

## 2012-03-17 NOTE — Telephone Encounter (Signed)
Patient called requesting referral to GI for ongoing abdominal pain, this was approved through Dr. Luciana Axe.  Referral made to [facility] and appt is for 03/21/12 at 1:00 pm with Dr. Charlott Rakes.  Patient notified of appt date, time, and address. [name] CMA

## 2012-03-27 ENCOUNTER — Encounter (HOSPITAL_COMMUNITY)
Admission: RE | Disposition: A | Discharge status: Home / Self Care | Payer: Self-pay | Source: Ambulatory Visit | Attending: Gastroenterology

## 2012-03-27 ENCOUNTER — Encounter (HOSPITAL_COMMUNITY): Payer: Self-pay | Admitting: *Deleted

## 2012-03-27 ENCOUNTER — Ambulatory Visit (HOSPITAL_COMMUNITY)
Admission: RE | Admit: 2012-03-27 | Discharge: 2012-03-27 | Disposition: A | Discharge status: Home / Self Care | Payer: Self-pay | Source: Ambulatory Visit | Attending: Gastroenterology | Admitting: Gastroenterology

## 2012-03-27 DIAGNOSIS — K259 Gastric ulcer, unspecified as acute or chronic, without hemorrhage or perforation: Secondary | ICD-10-CM | POA: Insufficient documentation

## 2012-03-27 DIAGNOSIS — K449 Diaphragmatic hernia without obstruction or gangrene: Secondary | ICD-10-CM | POA: Insufficient documentation

## 2012-03-27 DIAGNOSIS — K219 Gastro-esophageal reflux disease without esophagitis: Secondary | ICD-10-CM | POA: Insufficient documentation

## 2012-03-27 HISTORY — DX: Depression, unspecified: F32.A

## 2012-03-27 HISTORY — DX: Disease of blood and blood-forming organs, unspecified: D75.9

## 2012-03-27 HISTORY — DX: Headache: R51

## 2012-03-27 HISTORY — DX: Other chronic pain: G89.29

## 2012-03-27 HISTORY — DX: Anxiety disorder, unspecified: F41.9

## 2012-03-27 HISTORY — DX: Major depressive disorder, single episode, unspecified: F32.9

## 2012-03-27 HISTORY — DX: Unspecified osteoarthritis, unspecified site: M19.90

## 2012-03-27 HISTORY — DX: Dorsalgia, unspecified: M54.9

## 2012-03-27 HISTORY — PX: ESOPHAGOGASTRODUODENOSCOPY: SHX5428

## 2012-03-27 SURGERY — EGD (ESOPHAGOGASTRODUODENOSCOPY)
Anesthesia: Moderate Sedation

## 2012-03-27 MED ORDER — FENTANYL CITRATE 0.05 MG/ML IJ SOLN
INTRAMUSCULAR | Status: AC
  Filled 2012-03-27: qty 4

## 2012-03-27 MED ORDER — SODIUM CHLORIDE 0.9 % IV SOLN: INTRAVENOUS | Status: DC

## 2012-03-27 MED ORDER — MIDAZOLAM HCL 10 MG/2ML IJ SOLN
INTRAMUSCULAR | Status: AC
  Filled 2012-03-27: qty 4

## 2012-03-27 MED ORDER — SODIUM CHLORIDE 0.9 % IV SOLN
INTRAVENOUS | Status: DC
  Administered 2012-03-27: 500 mL via INTRAVENOUS

## 2012-03-27 MED ORDER — MIDAZOLAM HCL 10 MG/2ML IJ SOLN
INTRAMUSCULAR | Status: DC | PRN
  Administered 2012-03-27: 1 mg via INTRAVENOUS
  Administered 2012-03-27: 2 mg via INTRAVENOUS
  Administered 2012-03-27: 1 mg via INTRAVENOUS
  Administered 2012-03-27 (×2): 2 mg via INTRAVENOUS

## 2012-03-27 MED ORDER — BUTAMBEN-TETRACAINE-BENZOCAINE 2-2-14 % EX AERO
INHALATION_SPRAY | CUTANEOUS | Status: DC | PRN
  Administered 2012-03-27: 2 via TOPICAL

## 2012-03-27 MED ORDER — DIPHENHYDRAMINE HCL 50 MG/ML IJ SOLN
INTRAMUSCULAR | Status: AC
  Filled 2012-03-27: qty 1

## 2012-03-27 MED ORDER — OMEPRAZOLE 20 MG PO CPDR: 20.0000 mg | DELAYED_RELEASE_CAPSULE | Freq: Two times a day (BID) | ORAL | Status: DC

## 2012-03-27 MED ORDER — FENTANYL CITRATE 0.05 MG/ML IJ SOLN
INTRAMUSCULAR | Status: DC | PRN
  Administered 2012-03-27 (×4): 25 ug via INTRAVENOUS

## 2012-03-27 MED ORDER — DIPHENHYDRAMINE HCL 50 MG/ML IJ SOLN
INTRAMUSCULAR | Status: DC | PRN
  Administered 2012-03-27: 25 mg via INTRAVENOUS

## 2012-03-27 NOTE — Progress Notes (Signed)
Per pt's family, verbal from Dr. Bosie Clos was no NSAIDS.  D/C forms has Ibuprofen and naproxen on med list. Paged Dr. Bosie Clos and inquired about specific instructions re: NSAIDS.   Per Dr. Bosie Clos, no NSAIDS.  Informed pt and family.

## 2012-03-27 NOTE — H&P (Signed)
  Date of Initial H&P: 03/21/12  History reviewed, patient examined, no change in status, stable for surgery.

## 2012-03-27 NOTE — Op Note (Addendum)
[facility] [address] [place], [address]   ENDOSCOPY PROCEDURE REPORT  PATIENT: Shawn Brooks, Shawn Brooks  MR#: [id number] BIRTHDATE: [birth year] , 36  yrs. old GENDER: Male  ENDOSCOPIST: Charlott Rakes, MD REFERRED BY:  PROCEDURE DATE:  03/27/2012 PROCEDURE:   EGD w/ biopsy ASA CLASS:   Class III INDICATIONS:heartburn.   history of GERD. MEDICATIONS: Fentanyl 100 mcg IV, Versed 8 mg IV, Diphenhydramine (Benadryl) 25 mg IV, and Cetacaine spray x 2  TOPICAL ANESTHETIC:  DESCRIPTION OF PROCEDURE:   After the risks benefits and alternatives of the procedure were thoroughly explained, informed consent was obtained.  The Pentax Gastroscope [id number]  endoscope was introduced through the mouth and advanced to the second portion of the duodenum , limited by Without limitations.   The instrument was slowly withdrawn as the mucosa was fully examined.     FINDINGS: The endoscope was inserted into the oropharynx and esophagus was intubated.  The gastroesophageal junction was noted to be 40 cm from the incisors. Endoscope was advanced into the stomach, which revealed distal erythema in linear fashion in the antrum with a small ulceration noted.  The endoscope was advanced to the duodenal bulb and second portion of duodenum which were unremarkable.  The endoscope was withdrawn back into the stomach and retroflexion revealed a small hiatal hernia. A biopsy was taken of the antrum for histologic purposes.  COMPLICATIONS: None  ENDOSCOPIC IMPRESSION: 1. Distal antral gastritis and a small gastric ulceration - s/p biopsy of antrum to check for H. pylori and inflammation 2. Small hiatal hernia  RECOMMENDATIONS: Avoid NSAIDs; Continue PPI daily; F/U on path   REPEAT EXAM: N/A  _______________________________ Charlott Rakes, MD    CC:  PATIENT NAME:  Shawn Brooks, Shawn Brooks MR#: [id number]

## 2012-03-27 NOTE — Interval H&P Note (Signed)
History and Physical Interval Note:  03/27/2012 9:17 AM  Shawn Brooks  has presented today for surgery, with the diagnosis of gerd/  The various methods of treatment have been discussed with the patient and family. After consideration of risks, benefits and other options for treatment, the patient has consented to  Procedure(s) (LRB) with comments: ESOPHAGOGASTRODUODENOSCOPY (EGD) (N/A) as a surgical intervention .  The patient's history has been reviewed, patient examined, no change in status, stable for surgery.  I have reviewed the patient's chart and labs.  Questions were answered to the patient's satisfaction.     [name]

## 2012-03-28 ENCOUNTER — Encounter (HOSPITAL_COMMUNITY): Payer: Self-pay

## 2012-04-10 ENCOUNTER — Other Ambulatory Visit (INDEPENDENT_AMBULATORY_CARE_PROVIDER_SITE_OTHER): Payer: Self-pay

## 2012-04-10 DIAGNOSIS — Z113 Encounter for screening for infections with a predominantly sexual mode of transmission: Secondary | ICD-10-CM

## 2012-04-10 DIAGNOSIS — R369 Urethral discharge, unspecified: Secondary | ICD-10-CM

## 2012-04-11 ENCOUNTER — Telehealth: Payer: Self-pay | Admitting: *Deleted

## 2012-04-11 NOTE — Telephone Encounter (Signed)
ok 

## 2012-04-11 NOTE — Telephone Encounter (Signed)
Patient called and advised he needs another referral to physical therapy. He advised he was having trouble with transportation (which is now taken care of) and had to stop going, but that he did call. They advised him that because so much time has passed he needs a new referral. Advised the patient will have to ask the doctor and get back to him once I get an answer.

## 2012-04-12 ENCOUNTER — Encounter: Payer: Self-pay | Admitting: Infectious Disease

## 2012-04-12 ENCOUNTER — Ambulatory Visit (INDEPENDENT_AMBULATORY_CARE_PROVIDER_SITE_OTHER): Payer: Self-pay | Admitting: Infectious Disease

## 2012-04-12 VITALS — BP 112/72 | HR 77 | Temp 98.3°F | Ht 65.0 in | Wt 217.0 lb

## 2012-04-12 DIAGNOSIS — N419 Inflammatory disease of prostate, unspecified: Secondary | ICD-10-CM | POA: Insufficient documentation

## 2012-04-12 DIAGNOSIS — Z113 Encounter for screening for infections with a predominantly sexual mode of transmission: Secondary | ICD-10-CM

## 2012-04-12 DIAGNOSIS — G8929 Other chronic pain: Secondary | ICD-10-CM

## 2012-04-12 DIAGNOSIS — M545 Low back pain: Secondary | ICD-10-CM

## 2012-04-12 MED ORDER — LEVOFLOXACIN 500 MG PO TABS: 500.0000 mg | ORAL_TABLET | Freq: Every day | ORAL | Status: DC

## 2012-04-12 MED ORDER — NAPROXEN 500 MG PO TABS: 500.0000 mg | ORAL_TABLET | Freq: Two times a day (BID) | ORAL | Status: DC

## 2012-04-12 NOTE — Progress Notes (Signed)
Subjective:    Patient ID: Shawn Brooks, male    DOB: 1976/06/19, 36 y.o.   MRN: [id number]  HPI  36 year old [place] American man with reasonable control of his HIV on prezista, norvir and truvada who has had problems with penile discharge. He was seen in the ED in September and GC chlamydia probes were negative as was urine culture. He was given empiric rocephin and azithro and then a few days of cipro with improvement in his symptoms. His symptoms of penile discharge and dysuria returned this Sunday. On Monday he came for labs and GC and chlamydia from urine were again negative. He is sexually active with his partner and they have been monogamous besides one other partner a few months ago. His partner has been negative. He and partner largely DO NOT use condoms. His partner he claims is HIV positive and suppressed on ARV himself.   Patient has been seen by Dr. Bosie Clos for UGI ssx and underwent EGD which per pt may or may not have shown an ulcer. He says RN told him he had one but then report said no. He had been witholding his naproxen per instructions of Dr. Bosie Clos but now has resumed due to return of his pelvic pain and bursitis.   I spent greater than 45 minutes with the patient including greater than 50% of time in face to face counsel of the patient and in coordination of their care.   Review of Systems  Constitutional: Negative for fever, chills, diaphoresis, activity change, appetite change, fatigue and unexpected weight change.  HENT: Negative for congestion, sore throat, rhinorrhea, sneezing, trouble swallowing and sinus pressure.   Eyes: Negative for photophobia and visual disturbance.  Respiratory: Negative for cough, chest tightness, shortness of breath, wheezing and stridor.   Cardiovascular: Negative for chest pain, palpitations and leg swelling.  Gastrointestinal: Negative for nausea, vomiting, abdominal pain, diarrhea, constipation, blood in stool, abdominal distention and  anal bleeding.  Genitourinary: Positive for dysuria and discharge. Negative for hematuria, flank pain and difficulty urinating.  Musculoskeletal: Positive for back pain. Negative for myalgias, joint swelling, arthralgias and gait problem.  Skin: Negative for color change, pallor, rash and wound.  Neurological: Negative for dizziness, tremors, weakness and light-headedness.  Hematological: Negative for adenopathy. Does not bruise/bleed easily.  Psychiatric/Behavioral: Negative for behavioral problems, confusion, disturbed wake/sleep cycle, dysphoric mood, decreased concentration and agitation.       Objective:   Physical Exam  Constitutional: He is oriented to person, place, and time. He appears well-developed and well-nourished. No distress.  HENT:  Head: Normocephalic and atraumatic.  Mouth/Throat: Oropharynx is clear and moist. No oropharyngeal exudate.  Eyes: Conjunctivae normal and EOM are normal. Pupils are equal, round, and reactive to light. No scleral icterus.  Neck: Normal range of motion. Neck supple. No JVD present.  Cardiovascular: Normal rate, regular rhythm and normal heart sounds.  Exam reveals no gallop and no friction rub.   No murmur heard. Pulmonary/Chest: Effort normal and breath sounds normal. No respiratory distress. He has no wheezes. He has no rales. He exhibits no tenderness.  Abdominal: He exhibits no distension and no mass. There is no tenderness. There is no rebound and no guarding.  Genitourinary: Rectum normal and penis normal. Prostate is enlarged. Prostate is not tender. Right testis shows swelling. Right testis shows no mass and no tenderness. Left testis shows no mass, no swelling and no tenderness. Circumcised. No hypospadias, penile erythema or penile tenderness. No discharge found.  Musculoskeletal:  He exhibits no edema and no tenderness.  Lymphadenopathy:    He has no cervical adenopathy.  Neurological: He is alert and oriented to person, place, and  time. He has normal reflexes. He exhibits normal muscle tone. Coordination normal.  Skin: Skin is warm and dry. He is not diaphoretic. No erythema. No pallor.  Psychiatric: He has a normal mood and affect. His behavior is normal. Judgment and thought content normal.          Assessment & Plan:   1) Penile discharge: I suspect he may have prostatitis or possibly NGU ---check urethral swab for GC and chlamydia -- post prostatic massage urine culture --levaquin 500mg  daily for two months empric therapy --condoms  2)HIV: --has been well controlled, fu with Shawn Brooks in January for repeat labs  3) Gastritis ssx: better on probiotic --get records from Dr. Bosie Clos  4) Chronic bursitis pain: he has restarted naproxen but needs to check with dr. Bosie Clos if this is OK, other option is 1 gram tylenol qid vs ultram vs narcotic (he does not want last two options  5) HCM: flu shot

## 2012-04-13 ENCOUNTER — Other Ambulatory Visit: Payer: Self-pay | Admitting: *Deleted

## 2012-04-13 DIAGNOSIS — G8929 Other chronic pain: Secondary | ICD-10-CM

## 2012-04-13 MED ORDER — NAPROXEN 500 MG PO TABS: 500.0000 mg | ORAL_TABLET | Freq: Two times a day (BID) | ORAL | Status: DC

## 2012-04-14 LAB — GC/CHLAMYDIA PROBE AMP: CT Probe RNA: NEGATIVE

## 2012-04-18 ENCOUNTER — Telehealth: Payer: Self-pay | Admitting: *Deleted

## 2012-04-18 NOTE — Telephone Encounter (Signed)
Left message regarding lack of insurance and that [employer] does not pay for services provided outside of [facility].  Requested that pt return call to let us know whether he has insurance and if he wants to pay out-of-pocket for PT.

## 2012-06-01 ENCOUNTER — Emergency Department (HOSPITAL_COMMUNITY)
Admission: EM | Admit: 2012-06-01 | Discharge: 2012-06-01 | Disposition: A | Discharge status: Home / Self Care | Payer: Self-pay | Attending: Emergency Medicine | Admitting: Emergency Medicine

## 2012-06-01 ENCOUNTER — Encounter (HOSPITAL_COMMUNITY): Payer: Self-pay | Admitting: *Deleted

## 2012-06-01 DIAGNOSIS — M1990 Unspecified osteoarthritis, unspecified site: Secondary | ICD-10-CM | POA: Insufficient documentation

## 2012-06-01 DIAGNOSIS — B20 Human immunodeficiency virus [HIV] disease: Secondary | ICD-10-CM | POA: Insufficient documentation

## 2012-06-01 DIAGNOSIS — F17200 Nicotine dependence, unspecified, uncomplicated: Secondary | ICD-10-CM | POA: Insufficient documentation

## 2012-06-01 DIAGNOSIS — G8929 Other chronic pain: Secondary | ICD-10-CM | POA: Insufficient documentation

## 2012-06-01 DIAGNOSIS — IMO0002 Reserved for concepts with insufficient information to code with codable children: Secondary | ICD-10-CM | POA: Insufficient documentation

## 2012-06-01 DIAGNOSIS — F3289 Other specified depressive episodes: Secondary | ICD-10-CM | POA: Insufficient documentation

## 2012-06-01 DIAGNOSIS — M549 Dorsalgia, unspecified: Secondary | ICD-10-CM | POA: Insufficient documentation

## 2012-06-01 DIAGNOSIS — F411 Generalized anxiety disorder: Secondary | ICD-10-CM | POA: Insufficient documentation

## 2012-06-01 DIAGNOSIS — F329 Major depressive disorder, single episode, unspecified: Secondary | ICD-10-CM | POA: Insufficient documentation

## 2012-06-01 DIAGNOSIS — Z79899 Other long term (current) drug therapy: Secondary | ICD-10-CM | POA: Insufficient documentation

## 2012-06-01 DIAGNOSIS — R209 Unspecified disturbances of skin sensation: Secondary | ICD-10-CM | POA: Insufficient documentation

## 2012-06-01 DIAGNOSIS — E785 Hyperlipidemia, unspecified: Secondary | ICD-10-CM | POA: Insufficient documentation

## 2012-06-01 DIAGNOSIS — M5416 Radiculopathy, lumbar region: Secondary | ICD-10-CM

## 2012-06-01 MED ORDER — PREDNISONE 20 MG PO TABS: ORAL_TABLET | ORAL | Status: DC

## 2012-06-01 MED ORDER — ACETAMINOPHEN-CODEINE #3 300-30 MG PO TABS: 1.0000 | ORAL_TABLET | Freq: Four times a day (QID) | ORAL | Status: DC | PRN

## 2012-06-01 MED ORDER — CYCLOBENZAPRINE HCL 10 MG PO TABS: 10.0000 mg | ORAL_TABLET | Freq: Two times a day (BID) | ORAL | Status: DC | PRN

## 2012-06-01 NOTE — ED Provider Notes (Signed)
History     CSN: [id number]  Arrival date & time 06/01/12  1424   First MD Initiated Contact with Patient 06/01/12 1511      Chief Complaint  Patient presents with  . Back Pain    (Consider location/radiation/quality/duration/timing/severity/associated sxs/prior treatment) HPI Comments: 36 year old male with a history of chronic low back pain presents emergency department with worsening back pain for the past 2 days. States 2 days ago he felt sharp back pain when he got out of bed in the morning. Pain has been constant since, rated 10 out of 10, worse with movement. He normally takes naproxen on a regular basis which manages his chronic back pain very well, however he is not had any relief within the past 2 days. Admits to numbness down both of his legs. Denies loss of control of bowels or bladder or saddle anesthesia. Denies doing any heavy lifting or hard physical activity out of his normal.  The history is provided by the patient.    Past Medical History  Diagnosis Date  . HIV infection   . Hyperlipidemia   . Blood dyscrasia     HIV  . Chronic back pain   . Depression   . Anxiety   . Headache   . Vertigo   . DJD (degenerative joint disease)     Past Surgical History  Procedure Date  . Wisdom tooth extraction   . Esophagogastroduodenoscopy 03/27/2012    Procedure: ESOPHAGOGASTRODUODENOSCOPY (EGD);  Surgeon: Shirley Friar, MD;  Location: [facility];  Service: Endoscopy;  Laterality: N/A;    Family History  Problem Relation Age of Onset  . Cancer Mother     laryngeal  . Pneumonia Father   . Diabetes Father   . Hypertension Sister   . Crohn's disease Brother   . Crohn's disease Maternal Grandmother   . Cancer Maternal Grandmother   . Cancer Maternal Grandfather     History  Substance Use Topics  . Smoking status: Current Every Day Smoker -- 0.5 packs/day    Types: Cigarettes  . Smokeless tobacco: Never Used  . Alcohol Use: 3.5 oz/week    7 drink(s)  per week     Comment: beer at times. not often      Review of Systems  Constitutional: Negative for fever and chills.  HENT: Negative for neck pain and neck stiffness.   Gastrointestinal: Negative.   Genitourinary: Negative.   Musculoskeletal: Positive for back pain.  Neurological: Positive for numbness.    Allergies  Review of patient's allergies indicates no known allergies.  Home Medications   Current Outpatient Rx  Name  Route  Sig  Dispense  Refill  . GOODY HEADACHE PO   Oral   Take 1 Package by mouth as needed. Pain         . DARUNAVIR ETHANOLATE 800 MG PO TABS   Oral   Take 1 tablet (800 mg total) by mouth daily.   30 tablet   11   . EMTRICITABINE-TENOFOVIR 200-300 MG PO TABS   Oral   Take 1 tablet by mouth daily.   30 tablet   11   . ESCITALOPRAM OXALATE 20 MG PO TABS   Oral   Take 20 mg by mouth daily.         Marland Kitchen GABAPENTIN 300 MG PO CAPS   Oral   Take 300 mg by mouth 4 (four) times daily.         . IBUPROFEN 200 MG PO TABS  Oral   Take 800 mg by mouth every 8 (eight) hours as needed. For pain.         Marland Kitchen LEVOFLOXACIN 500 MG PO TABS   Oral   Take 500 mg by mouth daily.         Marland Kitchen NAPROXEN 500 MG PO TABS   Oral   Take 500 mg by mouth 2 (two) times daily with a meal.         . OMEPRAZOLE 20 MG PO CPDR   Oral   Take 20 mg by mouth 2 (two) times daily.         . QUETIAPINE FUMARATE 300 MG PO TABS   Oral   Take 300 mg by mouth at bedtime.         Marland Kitchen RITONAVIR 100 MG PO TABS   Oral   Take 1 tablet (100 mg total) by mouth daily.   30 tablet   11   . TRAZODONE HCL 50 MG PO TABS   Oral   Take 50 mg by mouth at bedtime. Mental health         . VALACYCLOVIR HCL 500 MG PO TABS   Oral   Take 500 mg by mouth 2 (two) times daily as needed. For flare-ups.         . ACETAMINOPHEN-CODEINE #3 300-30 MG PO TABS   Oral   Take 1-2 tablets by mouth every 6 (six) hours as needed for pain.   10 tablet   0   . CYCLOBENZAPRINE HCL  10 MG PO TABS   Oral   Take 1 tablet (10 mg total) by mouth 2 (two) times daily as needed for muscle spasms.   20 tablet   0   . PREDNISONE 20 MG PO TABS      Take 3 tabs PO x 2 days followed by 2 tabs PO x 2 days followed by 1 tab PO x 2 days   12 tablet   0     BP 110/39  Pulse 77  Temp 98.1 F (36.7 C) (Oral)  Resp 18  SpO2 96%  Physical Exam  Nursing note and vitals reviewed. Constitutional: He is oriented to person, place, and time. He appears well-developed and well-nourished. No distress.       Appears uncomfortable sitting in chair.  HENT:  Head: Normocephalic and atraumatic.  Eyes: Conjunctivae normal are normal.  Neck: Normal range of motion. Neck supple.  Cardiovascular: Normal rate, regular rhythm, normal heart sounds and intact distal pulses.   Pulmonary/Chest: Effort normal and breath sounds normal.  Musculoskeletal:       Lumbar back: He exhibits decreased range of motion (due to pain), tenderness (bilateral paraspinal muscles and SI joints) and spasm. He exhibits no bony tenderness and normal pulse.  Neurological: He is alert and oriented to person, place, and time. He has normal strength. No sensory deficit. Gait (slow) normal.  Skin: Skin is warm and dry.  Psychiatric: He has a normal mood and affect. His behavior is normal.    ED Course  Procedures (including critical care time)  Labs Reviewed - No data to display No results found.   1. Lumbar radiculopathy   2. Back pain       MDM  36 year old male with chronic back pain presenting with worsening pain for the past 2 days. No red flags concerning patient's back pain. No s/s of central cord compression or cauda equina. Lower extremities are neurovascularly intact and patient is ambulating slowly  but without difficulty. Bilateral paraspinal muscles has spasm. Prescribed Flexeril and prednisone. Tylenol No. 3 prescribed for breakthrough pain. Return precautions discussed.         Trevor Mace, PA-C 06/01/12 [id number]

## 2012-06-01 NOTE — ED Provider Notes (Signed)
Medical screening examination/treatment/procedure(s) were performed by non-physician practitioner and as supervising physician I was immediately available for consultation/collaboration.  Damian Buckles, MD 06/01/12 2344 

## 2012-06-01 NOTE — ED Notes (Signed)
Per Pt: hx of lower and mid back pain, for which he is taking naproxen which is not helping. Pt sts he tried other OTC pain relievers but no help.

## 2012-06-02 ENCOUNTER — Telehealth: Payer: Self-pay | Admitting: *Deleted

## 2012-06-02 NOTE — Telephone Encounter (Signed)
Patient called stating he was seen at the ED for back pain which is an ongoing problem. Wants to establish with a PCP. He is uninsured however he does have the [facility] discount card. Scheduled an appointment with Dr. Laural Benes at the Urgent Care for Wednesday 06/07/12 at 12:30 pm. Shawn Brooks CMA

## 2012-06-05 ENCOUNTER — Ambulatory Visit (INDEPENDENT_AMBULATORY_CARE_PROVIDER_SITE_OTHER): Payer: Self-pay | Admitting: Internal Medicine

## 2012-06-05 ENCOUNTER — Encounter: Payer: Self-pay | Admitting: Internal Medicine

## 2012-06-05 VITALS — BP 111/68 | HR 88 | Temp 98.0°F | Ht 68.0 in | Wt 225.0 lb

## 2012-06-05 DIAGNOSIS — M545 Low back pain: Secondary | ICD-10-CM

## 2012-06-05 DIAGNOSIS — G8929 Other chronic pain: Secondary | ICD-10-CM

## 2012-06-05 NOTE — Assessment & Plan Note (Signed)
He will continue with Naprosyn. I often Flexeril but he is unable to afford it. He was also given the number for rehabilitation to call them again to see if he can reestablish physical therapy. Encouraged him to keep his primary care physician appointment.

## 2012-06-05 NOTE — Progress Notes (Signed)
  Subjective:    Patient ID: Shawn Brooks, male    DOB: 1975-10-13, 36 y.o.   MRN: [id number]  HPI He is here for hospital followup. He has had chronic low back pain and had significant increase in his pain and presented to the emergency room about one week ago. He had pain down his legs. He was evaluated and were no concerning signs including no signs of bowel or bladder dysfunction. He has had muscle spasm. He took a course of Flexeril and prednisone. He still has Flexeril. He is unable to afford more Flexeril. He also takes Naprosyn now that he is off the penicillin again. He has been in physical therapy before but because of frequent no-shows, he is no longer able to return. He does have an appointment with a new primary care physician in 2 days.   Review of Systems  Musculoskeletal: Positive for back pain. Negative for gait problem.  Neurological: Negative for dizziness.       Objective:   Physical Exam  Musculoskeletal:       Back spasm          Assessment & Plan:

## 2012-06-07 ENCOUNTER — Ambulatory Visit (HOSPITAL_COMMUNITY): Admission: RE | Admit: 2012-06-07 | Payer: Self-pay | Source: Ambulatory Visit

## 2012-06-07 ENCOUNTER — Emergency Department (INDEPENDENT_AMBULATORY_CARE_PROVIDER_SITE_OTHER)
Admission: EM | Admit: 2012-06-07 | Discharge: 2012-06-07 | Disposition: A | Discharge status: Home / Self Care | Payer: Self-pay | Source: Home / Self Care | Attending: Emergency Medicine | Admitting: Emergency Medicine

## 2012-06-07 ENCOUNTER — Encounter (HOSPITAL_COMMUNITY): Payer: Self-pay

## 2012-06-07 DIAGNOSIS — M545 Low back pain: Secondary | ICD-10-CM

## 2012-06-07 DIAGNOSIS — G8929 Other chronic pain: Secondary | ICD-10-CM

## 2012-06-07 MED ORDER — ACETAMINOPHEN-CODEINE #3 300-30 MG PO TABS: 2.0000 | ORAL_TABLET | Freq: Four times a day (QID) | ORAL | Status: DC | PRN

## 2012-06-07 NOTE — ED Provider Notes (Signed)
History     CSN: [id number]  Arrival date & time 06/07/12  1237   First MD Initiated Contact with Patient 06/07/12 1348      Chief Complaint  Patient presents with  . Back Pain    (Consider location/radiation/quality/duration/timing/severity/associated sxs/prior treatment) HPI 36 year old male with HIV, hyperlipidemia, chronic low back pain following accident about 15 years back presents with persistent low back pain radiating to his right leg. Patient informs his symptoms to be chronic and ongoing without much relief. Was recently seen in the ED and given her a course of prednisone and pain medications including Flexeril without much relief. He informs symptoms are the worst with movement. Denies any bowel or urinary symptoms. Denies any tingling or numbness however has shooting pain down the legs. He informs having imaging over the back remote past. Denies fever, chills, headache, blurry vision, chest pain, shortness of breath, nausea, vomiting, abdominal pain, bowel or urinary symptoms. Denies weakness.  Past Medical History  Diagnosis Date  . HIV infection   . Hyperlipidemia   . Blood dyscrasia     HIV  . Chronic back pain   . Depression   . Anxiety   . Headache   . Vertigo   . DJD (degenerative joint disease)     Past Surgical History  Procedure Date  . Wisdom tooth extraction   . Esophagogastroduodenoscopy 03/27/2012    Procedure: ESOPHAGOGASTRODUODENOSCOPY (EGD);  Surgeon: Shirley Friar, MD;  Location: [facility];  Service: Endoscopy;  Laterality: N/A;    Family History  Problem Relation Age of Onset  . Cancer Mother     laryngeal  . Pneumonia Father   . Diabetes Father   . Hypertension Sister   . Crohn's disease Brother   . Crohn's disease Maternal Grandmother   . Cancer Maternal Grandmother   . Cancer Maternal Grandfather     History  Substance Use Topics  . Smoking status: Current Every Day Smoker -- 0.5 packs/day    Types: Cigarettes  .  Smokeless tobacco: Never Used  . Alcohol Use: 3.5 oz/week    7 drink(s) per week     Comment: beer at times. not often      Review of Systems Outlined in history of present illness Allergies  Review of patient's allergies indicates no known allergies.  Home Medications   Current Outpatient Rx  Name  Route  Sig  Dispense  Refill  . ACETAMINOPHEN-CODEINE #3 300-30 MG PO TABS   Oral   Take 2 tablets by mouth every 6 (six) hours as needed for pain.   30 tablet   0   . GOODY HEADACHE PO   Oral   Take 1 Package by mouth as needed. Pain         . CYCLOBENZAPRINE HCL 10 MG PO TABS   Oral   Take 1 tablet (10 mg total) by mouth 2 (two) times daily as needed for muscle spasms.   20 tablet   0   . DARUNAVIR ETHANOLATE 800 MG PO TABS   Oral   Take 1 tablet (800 mg total) by mouth daily.   30 tablet   11   . EMTRICITABINE-TENOFOVIR 200-300 MG PO TABS   Oral   Take 1 tablet by mouth daily.   30 tablet   11   . ESCITALOPRAM OXALATE 20 MG PO TABS   Oral   Take 20 mg by mouth daily.         Marland Kitchen GABAPENTIN 300 MG PO CAPS  Oral   Take 300 mg by mouth 4 (four) times daily.         . IBUPROFEN 200 MG PO TABS   Oral   Take 800 mg by mouth every 8 (eight) hours as needed. For pain.         Marland Kitchen LEVOFLOXACIN 500 MG PO TABS   Oral   Take 500 mg by mouth daily.         Marland Kitchen NAPROXEN 500 MG PO TABS   Oral   Take 500 mg by mouth 2 (two) times daily with a meal.         . OMEPRAZOLE 20 MG PO CPDR   Oral   Take 20 mg by mouth 2 (two) times daily.         Marland Kitchen PREDNISONE 20 MG PO TABS      Take 3 tabs PO x 2 days followed by 2 tabs PO x 2 days followed by 1 tab PO x 2 days   12 tablet   0   . QUETIAPINE FUMARATE 300 MG PO TABS   Oral   Take 300 mg by mouth at bedtime.         Marland Kitchen RITONAVIR 100 MG PO TABS   Oral   Take 1 tablet (100 mg total) by mouth daily.   30 tablet   11   . TRAZODONE HCL 50 MG PO TABS   Oral   Take 50 mg by mouth at bedtime. Mental  health         . VALACYCLOVIR HCL 500 MG PO TABS   Oral   Take 500 mg by mouth 2 (two) times daily as needed. For flare-ups.           BP 107/66  Pulse 72  Temp 98.4 F (36.9 C) (Oral)  Resp 19  SpO2 99%  Physical Exam Middle-aged male in some distress with pain HEENT: No pallor, moist oral mucosa Chest: Clear to auscultation bilaterally no added sounds CVS: Normal S1 and S2 no murmurs Abdomen: Soft, nontender, nondistended bowel sounds present Extremities: Warm, no edema tenderness over lumbosacral area on palpation. SLTR positive over right leg. CNS: AAO x3 ED Course  Procedures (including critical care time)  Labs Reviewed - No data to display No results found.   1. Chronic low back pain    Symptoms have been ongoing for several months to year. No imaging in the system. Will check a lumbar spine x-ray. Her prescribed him with a short course of Tylenol #3. Continue with current dose of Flexeril and NSAIDs. Also on prednisone recently given in the ED. Follows with Dr. Luciana Axe as outpatient for his HIV. Patient was following with physical therapy and is in process of reviewing his services. Daily with warm compress.  Follow up in 2 weeks.  MDM          Eddie North, MD 06/07/12 1439

## 2012-06-07 NOTE — ED Notes (Signed)
Patient states was in the ED last week for back pain states it is an ongoing injury

## 2012-06-12 ENCOUNTER — Ambulatory Visit: Payer: Self-pay

## 2012-06-13 ENCOUNTER — Ambulatory Visit: Payer: Self-pay

## 2012-06-20 ENCOUNTER — Emergency Department (INDEPENDENT_AMBULATORY_CARE_PROVIDER_SITE_OTHER)
Admission: EM | Admit: 2012-06-20 | Discharge: 2012-06-20 | Disposition: A | Discharge status: Home / Self Care | Payer: Medicaid Other | Source: Home / Self Care

## 2012-06-20 ENCOUNTER — Encounter (HOSPITAL_COMMUNITY): Payer: Self-pay

## 2012-06-20 DIAGNOSIS — G8929 Other chronic pain: Secondary | ICD-10-CM

## 2012-06-20 DIAGNOSIS — M545 Low back pain: Secondary | ICD-10-CM

## 2012-06-20 MED ORDER — CYCLOBENZAPRINE HCL 10 MG PO TABS: 10.0000 mg | ORAL_TABLET | Freq: Two times a day (BID) | ORAL | Status: DC | PRN

## 2012-06-20 MED ORDER — ACETAMINOPHEN-CODEINE #3 300-30 MG PO TABS: 2.0000 | ORAL_TABLET | Freq: Four times a day (QID) | ORAL | Status: DC | PRN

## 2012-06-20 NOTE — ED Provider Notes (Signed)
History     CSN: [id number]  Arrival date & time 06/20/12  1005   First MD Initiated Contact with Patient 06/20/12 1017       Chief Complaint  Patient presents with  . Follow-up   36 year old male with HIV(well controlled), hyperlipidemia, chronic low back pain(15 years) following accident about 15 years back presents with persistent low back pain radiating to his right leg. Patient informs his symptoms to be chronic and ongoing without much relief. Was seen in the ED and given him a course of prednisone and pain medications including Flexeril without much relief. He was seen by Dr. Gonzella Lex 2 weeks ago and was prescribed tylenol#3.  He informs symptoms are better then before but still have a lot of limitations in terms of the activities of daily living. Denies any bowel or urinary symptoms. Denies any tingling or numbness however has shooting pain down the legs. He informs having imaging over the back remote past.   Denies fever, chills, headache, blurry vision, chest pain, shortness of breath, nausea, vomiting, abdominal pain, bowel or urinary symptoms. Denies weakness  (Consider location/radiation/quality/duration/timing/severity/associated sxs/prior treatment) HPI  Past Medical History  Diagnosis Date  . HIV infection   . Hyperlipidemia   . Blood dyscrasia     HIV  . Chronic back pain   . Depression   . Anxiety   . Headache   . Vertigo   . DJD (degenerative joint disease)     Past Surgical History  Procedure Date  . Wisdom tooth extraction   . Esophagogastroduodenoscopy 03/27/2012    Procedure: ESOPHAGOGASTRODUODENOSCOPY (EGD);  Surgeon: Shirley Friar, MD;  Location: [facility];  Service: Endoscopy;  Laterality: N/A;    Family History  Problem Relation Age of Onset  . Cancer Mother     laryngeal  . Pneumonia Father   . Diabetes Father   . Hypertension Sister   . Crohn's disease Brother   . Crohn's disease Maternal Grandmother   . Cancer Maternal  Grandmother   . Cancer Maternal Grandfather     History  Substance Use Topics  . Smoking status: Current Every Day Smoker -- 0.5 packs/day    Types: Cigarettes  . Smokeless tobacco: Never Used  . Alcohol Use: 3.5 oz/week    7 drink(s) per week     Comment: beer at times. not often      Review of Systems  Constitutional: Negative for fever, activity change and appetite change.  HENT: Negative for sore throat.   Respiratory: Negative for cough and shortness of breath.   Cardiovascular: Negative for chest pain and leg swelling.  Gastrointestinal: Negative for nausea, abdominal pain, diarrhea, constipation and abdominal distention.  Genitourinary: Negative for frequency, hematuria and difficulty urinating.  Musculoskeletal: Positive for back pain and gait problem.  Neurological: Negative for dizziness and headaches.  Psychiatric/Behavioral: Negative for suicidal ideas and behavioral problems.    Allergies  Review of patient's allergies indicates no known allergies.  Home Medications   Current Outpatient Rx  Name  Route  Sig  Dispense  Refill  . ACETAMINOPHEN-CODEINE #3 300-30 MG PO TABS   Oral   Take 2 tablets by mouth every 6 (six) hours as needed for pain.   30 tablet   0   . GOODY HEADACHE PO   Oral   Take 1 Package by mouth as needed. Pain         . CYCLOBENZAPRINE HCL 10 MG PO TABS   Oral   Take 1 tablet (10  mg total) by mouth 2 (two) times daily as needed for muscle spasms.   20 tablet   0   . DARUNAVIR ETHANOLATE 800 MG PO TABS   Oral   Take 1 tablet (800 mg total) by mouth daily.   30 tablet   11   . EMTRICITABINE-TENOFOVIR 200-300 MG PO TABS   Oral   Take 1 tablet by mouth daily.   30 tablet   11   . ESCITALOPRAM OXALATE 20 MG PO TABS   Oral   Take 20 mg by mouth daily.         Marland Kitchen GABAPENTIN 300 MG PO CAPS   Oral   Take 300 mg by mouth 4 (four) times daily.         . IBUPROFEN 200 MG PO TABS   Oral   Take 800 mg by mouth every 8  (eight) hours as needed. For pain.         Marland Kitchen LEVOFLOXACIN 500 MG PO TABS   Oral   Take 500 mg by mouth daily.         Marland Kitchen NAPROXEN 500 MG PO TABS   Oral   Take 500 mg by mouth 2 (two) times daily with a meal.         . OMEPRAZOLE 20 MG PO CPDR   Oral   Take 20 mg by mouth 2 (two) times daily.         Marland Kitchen PREDNISONE 20 MG PO TABS      Take 3 tabs PO x 2 days followed by 2 tabs PO x 2 days followed by 1 tab PO x 2 days   12 tablet   0   . QUETIAPINE FUMARATE 300 MG PO TABS   Oral   Take 300 mg by mouth at bedtime.         Marland Kitchen RITONAVIR 100 MG PO TABS   Oral   Take 1 tablet (100 mg total) by mouth daily.   30 tablet   11   . TRAZODONE HCL 50 MG PO TABS   Oral   Take 50 mg by mouth at bedtime. Mental health         . VALACYCLOVIR HCL 500 MG PO TABS   Oral   Take 500 mg by mouth 2 (two) times daily as needed. For flare-ups.           BP 126/72  Pulse 113  Temp 98.8 F (37.1 C) (Oral)  Resp 19  SpO2 99%  Physical Exam  Constitutional: He is oriented to person, place, and time. He appears well-developed and well-nourished.  HENT:  Head: Normocephalic and atraumatic.  Eyes: Conjunctivae normal and EOM are normal. Pupils are equal, round, and reactive to light. No scleral icterus.  Neck: Normal range of motion. Neck supple. No JVD present. No thyromegaly present.  Cardiovascular: Normal rate, regular rhythm, normal heart sounds and intact distal pulses.  Exam reveals no gallop and no friction rub.   No murmur heard. Pulmonary/Chest: Effort normal and breath sounds normal. No respiratory distress. He has no wheezes. He has no rales.  Abdominal: Soft. Bowel sounds are normal. He exhibits no distension and no mass. There is no tenderness. There is no rebound and no guarding.  Musculoskeletal: Normal range of motion. He exhibits tenderness. He exhibits no edema.       Paraspinal muscle tenderness in and around lumbar region.  Lymphadenopathy:    He has no  cervical adenopathy.  Neurological: He is  alert and oriented to person, place, and time.  Psychiatric: He has a normal mood and affect. His behavior is normal.    ED Course  Procedures (including critical care time)  Labs Reviewed - No data to display No results found.   No diagnosis found.    MDM   1.  Chronic low back pain    Symptoms have been ongoing for several months to year. No imaging in the system. MRI done 9 years ago per patient. Unable to afford the MRI but currently has orange card whioch should cover MRI in [place]. I will set that up[ today. Refill flexeril and tylenol#3 for breakthrough pain. Follow up MRI and refer to PM and R for back pain management as per the MRI results. Physical therapy today. Follow up in 2 weeks.         Lars Mage, MD 06/20/12 1051

## 2012-06-20 NOTE — ED Notes (Signed)
Follow up back pain

## 2012-07-03 ENCOUNTER — Ambulatory Visit: Payer: Self-pay

## 2012-07-03 ENCOUNTER — Other Ambulatory Visit: Payer: Self-pay | Admitting: *Deleted

## 2012-07-03 DIAGNOSIS — G629 Polyneuropathy, unspecified: Secondary | ICD-10-CM

## 2012-07-03 MED ORDER — GABAPENTIN 300 MG PO CAPS: 300.0000 mg | ORAL_CAPSULE | Freq: Four times a day (QID) | ORAL | Status: DC

## 2012-07-13 ENCOUNTER — Other Ambulatory Visit (INDEPENDENT_AMBULATORY_CARE_PROVIDER_SITE_OTHER): Payer: No Typology Code available for payment source

## 2012-07-13 DIAGNOSIS — B20 Human immunodeficiency virus [HIV] disease: Secondary | ICD-10-CM

## 2012-07-13 LAB — COMPLETE METABOLIC PANEL WITH GFR
AST: 23 U/L (ref 0–37)
Albumin: 3.9 g/dL (ref 3.5–5.2)
Alkaline Phosphatase: 93 U/L (ref 39–117)
BUN: 9 mg/dL (ref 6–23)
Calcium: 9.1 mg/dL (ref 8.4–10.5)
Chloride: 107 mEq/L (ref 96–112)
Glucose, Bld: 76 mg/dL (ref 70–99)
Potassium: 4.1 mEq/L (ref 3.5–5.3)
Sodium: 139 mEq/L (ref 135–145)
Total Protein: 6.9 g/dL (ref 6.0–8.3)

## 2012-07-13 LAB — CBC WITH DIFFERENTIAL/PLATELET
Basophils Relative: 0 % (ref 0–1)
Eosinophils Absolute: 0.1 10*3/uL (ref 0.0–0.7)
Eosinophils Relative: 1 % (ref 0–5)
HCT: 44.4 % (ref 39.0–52.0)
Hemoglobin: 15.4 g/dL (ref 13.0–17.0)
Lymphs Abs: 2.2 10*3/uL (ref 0.7–4.0)
MCH: 30.9 pg (ref 26.0–34.0)
MCHC: 34.7 g/dL (ref 30.0–36.0)
MCV: 89 fL (ref 78.0–100.0)
Monocytes Absolute: 0.6 10*3/uL (ref 0.1–1.0)
Monocytes Relative: 6 % (ref 3–12)
Neutrophils Relative %: 70 % (ref 43–77)
RBC: 4.99 MIL/uL (ref 4.22–5.81)

## 2012-07-14 LAB — T-HELPER CELL (CD4) - (RCID CLINIC ONLY): CD4 T Cell Abs: 680 uL (ref 400–2700)

## 2012-07-16 LAB — HIV-1 RNA QUANT-NO REFLEX-BLD: HIV 1 RNA Quant: 29 copies/mL — ABNORMAL HIGH (ref <20)

## 2012-07-18 ENCOUNTER — Telehealth (HOSPITAL_COMMUNITY): Payer: Self-pay

## 2012-07-18 NOTE — Progress Notes (Signed)
Pt called into office today and was very belligerent and abusive to staff about why his MRI order got cancelled.  The patient began to call the staff names.  The MRI had been ordered by another provider that saw the patient on 12/24.  It was placed as a future order.   We have no idea why the order got cancelled.  We tried to call and find out why the order got cancelled but radiology could not tell us the reason.  I reviewed the documentation of the other 2 providers that saw the patient here in this clinic.  I re-entered the MRI order into the EMR as a future order today as instructed by the radiology assistant that we spoke to on the telephone.    Rodney Langton, MD, CDE, FAAFP [facility] [place], [place]

## 2012-07-19 ENCOUNTER — Ambulatory Visit (HOSPITAL_COMMUNITY)
Admission: RE | Admit: 2012-07-19 | Discharge: 2012-07-19 | Disposition: A | Discharge status: Home / Self Care | Payer: No Typology Code available for payment source | Source: Ambulatory Visit | Attending: Family Medicine | Admitting: Family Medicine

## 2012-07-19 DIAGNOSIS — M545 Low back pain, unspecified: Secondary | ICD-10-CM | POA: Insufficient documentation

## 2012-07-19 DIAGNOSIS — M5126 Other intervertebral disc displacement, lumbar region: Secondary | ICD-10-CM | POA: Insufficient documentation

## 2012-07-19 DIAGNOSIS — G8929 Other chronic pain: Secondary | ICD-10-CM | POA: Insufficient documentation

## 2012-07-19 MED ORDER — GADOBENATE DIMEGLUMINE 529 MG/ML IV SOLN
20.0000 mL | Freq: Once | INTRAVENOUS | Status: AC
  Administered 2012-07-19: 20 mL via INTRAVENOUS

## 2012-07-21 NOTE — ED Notes (Signed)
Call from pharmacy, asking for authorization to fill rx written by [facility] on 12-24. Pharmacist advised we cannot provide any guidance on another clinic's Rx, and they are to call them back on Monday AM

## 2012-07-25 NOTE — ED Notes (Signed)
Patient notified of MRI results and names of ortho. Dr. given

## 2012-07-27 ENCOUNTER — Ambulatory Visit (INDEPENDENT_AMBULATORY_CARE_PROVIDER_SITE_OTHER): Payer: No Typology Code available for payment source | Admitting: Internal Medicine

## 2012-07-27 ENCOUNTER — Encounter: Payer: Self-pay | Admitting: Internal Medicine

## 2012-07-27 VITALS — BP 116/74 | HR 90 | Temp 97.8°F | Ht 68.5 in | Wt 220.0 lb

## 2012-07-27 DIAGNOSIS — M545 Low back pain: Secondary | ICD-10-CM

## 2012-07-27 DIAGNOSIS — K625 Hemorrhage of anus and rectum: Secondary | ICD-10-CM | POA: Insufficient documentation

## 2012-07-27 DIAGNOSIS — G8929 Other chronic pain: Secondary | ICD-10-CM

## 2012-07-27 DIAGNOSIS — B20 Human immunodeficiency virus [HIV] disease: Secondary | ICD-10-CM

## 2012-07-27 NOTE — Assessment & Plan Note (Signed)
MRI was reassuring. He does have an appointment with a back specialist. I suspect his hip pain is related to his back pain and back spasm.

## 2012-07-27 NOTE — Assessment & Plan Note (Signed)
He is doing well and will continue with his current medications. He will return in 4 months for routine followup.

## 2012-07-27 NOTE — Progress Notes (Signed)
  Subjective:    Patient ID: Shawn Brooks, male    DOB: September 12, 1975, 37 y.o.   MRN: [id number]  HPI Comes in here for routine followup. He continues to take Prezista with Norvir and Truvada. He denies missed doses. His complaints today are that he has bright red blood per has been going on for about one month. No gross blood. No lesions that he is felt. No history of this before. He also has hip pain which is in addition to his previous back pain. He was seen by a primary physician an MRI did not show any significant impingement and only very mild disc protrusion. He has been referred to a back doctor for his back pain. He takes Flexeril and NSAIDs.    Review of Systems  Constitutional: Negative for fever, fatigue and unexpected weight change.  HENT: Negative for sore throat and trouble swallowing.   Respiratory: Negative for shortness of breath.   Gastrointestinal: Positive for blood in stool. Negative for nausea, abdominal pain, diarrhea, constipation and rectal pain.  Musculoskeletal: Positive for back pain and arthralgias. Negative for myalgias, joint swelling and gait problem.  Skin: Negative for rash.  Neurological: Negative for dizziness and headaches.       Objective:   Physical Exam  Constitutional: He appears well-developed and well-nourished. No distress.  Cardiovascular: Normal rate, regular rhythm and normal heart sounds.  Exam reveals no gallop and no friction rub.   No murmur heard. Pulmonary/Chest: Effort normal and breath sounds normal. No respiratory distress. He has no wheezes. He has no rales.  Abdominal: Soft. Bowel sounds are normal. He exhibits no distension. There is no tenderness.  Genitourinary:       No external hemorrhoids or other lesions noted. No gross blood.          Assessment & Plan:

## 2012-07-27 NOTE — Assessment & Plan Note (Signed)
His hemoglobin is stable despite the blood that was noted. Therefore no acute indication for any intervention. He is going to return to his gastroenterologist and call today for an appointment to address this. I suspect this is an internal hemorrhoids. Nothing external noted.

## 2012-08-07 ENCOUNTER — Ambulatory Visit: Payer: No Typology Code available for payment source

## 2012-08-07 ENCOUNTER — Ambulatory Visit (INDEPENDENT_AMBULATORY_CARE_PROVIDER_SITE_OTHER): Payer: No Typology Code available for payment source | Admitting: Sports Medicine

## 2012-08-07 ENCOUNTER — Encounter: Payer: Self-pay | Admitting: Sports Medicine

## 2012-08-07 VITALS — BP 122/78 | HR 85 | Ht 68.75 in | Wt 220.0 lb

## 2012-08-07 DIAGNOSIS — M545 Low back pain: Secondary | ICD-10-CM

## 2012-08-07 MED ORDER — KETOROLAC TROMETHAMINE 60 MG/2ML IM SOLN
60.0000 mg | Freq: Once | INTRAMUSCULAR | Status: AC
  Administered 2012-08-07: 60 mg via INTRAMUSCULAR

## 2012-08-07 MED ORDER — METHYLPREDNISOLONE ACETATE 80 MG/ML IJ SUSP
80.0000 mg | Freq: Once | INTRAMUSCULAR | Status: AC
  Administered 2012-08-07: 80 mg via INTRAMUSCULAR

## 2012-08-07 NOTE — Progress Notes (Signed)
Subjective:    Shawn Brooks is a 37 y.o. male who presents for evaluation of low back pain. Pain present for 44yrs. after fall at work where pt carrying heavy load fell and hit his lower back. Pain is constant and dull. Radiates from lower back to both hips. Feels swollen adn makes is hard to move and sit. Pain shoots down L leg (inside and all around the thigh). Numbness after pain in leg. Also has had tendonitis, shin splints, and fallen arches in the past. Also w/ back spasms starting from lower back and radiating up back. Occasionally nauseas, and decreased strength in leg when pain comes on. Unable to sleep laying down, sleeps w/ pillow between legs. Pain wakes up at night. Tylenol, naproxen, and flexeril w/ some benefit. More frequent flares in cold weather. No longer participates in athletics due to pain. Has tried PT and electrolysis w/o much benefit. Denies fevers, night sweats, unintentional wt loss.   The following portions of the patient's history were reviewed and updated as appropriate: allergies, current medications, past family history, past medical history, past social history, past surgical history and problem list.  Review of Systems Pertinent items are noted in HPI.    Objective:  GEN: WNWD, NAD HEENT: MMM, EOMI Skin: warm, dry, intact MSK: ROM limited secondary to pain, no point tenderness but diffuse mild tenderness to palpation along the sacral region and pain in lateral hips on deep palpation of ischial tuberosity. Good muscle bulk bilat in LE. Pt able to ambulate from chair to table wlowly but w/o difficulty. Straight leg raise to 45 degrees before pt resisted due to pain bilaterally. FABERs w/o sacral pain but induced bilaterall lateral hip pain. Flexion, and extension of the LE and hip was 1+ on L and 3+ on R. Dorsiflexion and plantar flexion of the L foot was 1+ and R foot was 3+. Diminished sensation on L medial foot. Trace patellar and achilles reflexes bilaterally.    MRI 07/19/12: Minimal left foraminal disc protrusion L4-5 without significant nerve root impingement.  Chronic appearing central disc protrusion and mild osteophyte L5- S1.       Assessment:   Low back strain w/ mild degenerative disc disease.     Plan:     No surgical pathology seen on MRI. Pt has tried PT w/o much benefit.  Pain likely to be a chronic condition for pt. Depo-medrol 80mg  IM Toradol 60mg  IM Continue current medications and followup when necessary

## 2012-09-24 ENCOUNTER — Emergency Department (HOSPITAL_COMMUNITY): Payer: Self-pay

## 2012-09-24 ENCOUNTER — Emergency Department (HOSPITAL_COMMUNITY)
Admission: EM | Admit: 2012-09-24 | Discharge: 2012-09-24 | Disposition: A | Discharge status: Home / Self Care | Payer: Self-pay | Attending: Emergency Medicine | Admitting: Emergency Medicine

## 2012-09-24 ENCOUNTER — Encounter (HOSPITAL_COMMUNITY): Payer: Self-pay | Admitting: Emergency Medicine

## 2012-09-24 DIAGNOSIS — F329 Major depressive disorder, single episode, unspecified: Secondary | ICD-10-CM | POA: Insufficient documentation

## 2012-09-24 DIAGNOSIS — K648 Other hemorrhoids: Secondary | ICD-10-CM | POA: Insufficient documentation

## 2012-09-24 DIAGNOSIS — R350 Frequency of micturition: Secondary | ICD-10-CM | POA: Insufficient documentation

## 2012-09-24 DIAGNOSIS — M549 Dorsalgia, unspecified: Secondary | ICD-10-CM | POA: Insufficient documentation

## 2012-09-24 DIAGNOSIS — Z8739 Personal history of other diseases of the musculoskeletal system and connective tissue: Secondary | ICD-10-CM | POA: Insufficient documentation

## 2012-09-24 DIAGNOSIS — F17200 Nicotine dependence, unspecified, uncomplicated: Secondary | ICD-10-CM | POA: Insufficient documentation

## 2012-09-24 DIAGNOSIS — G8929 Other chronic pain: Secondary | ICD-10-CM | POA: Insufficient documentation

## 2012-09-24 DIAGNOSIS — Z21 Asymptomatic human immunodeficiency virus [HIV] infection status: Secondary | ICD-10-CM | POA: Insufficient documentation

## 2012-09-24 DIAGNOSIS — F3289 Other specified depressive episodes: Secondary | ICD-10-CM | POA: Insufficient documentation

## 2012-09-24 DIAGNOSIS — F411 Generalized anxiety disorder: Secondary | ICD-10-CM | POA: Insufficient documentation

## 2012-09-24 DIAGNOSIS — Z79899 Other long term (current) drug therapy: Secondary | ICD-10-CM | POA: Insufficient documentation

## 2012-09-24 DIAGNOSIS — Z862 Personal history of diseases of the blood and blood-forming organs and certain disorders involving the immune mechanism: Secondary | ICD-10-CM | POA: Insufficient documentation

## 2012-09-24 DIAGNOSIS — M25559 Pain in unspecified hip: Secondary | ICD-10-CM | POA: Insufficient documentation

## 2012-09-24 LAB — CBC WITH DIFFERENTIAL/PLATELET
Eosinophils Absolute: 0.1 10*3/uL (ref 0.0–0.7)
Lymphocytes Relative: 20 % (ref 12–46)
MCHC: 34.4 g/dL (ref 30.0–36.0)
Neutro Abs: 7.3 10*3/uL (ref 1.7–7.7)
Neutrophils Relative %: 73 % (ref 43–77)
RDW: 14.4 % (ref 11.5–15.5)
WBC: 10.1 10*3/uL (ref 4.0–10.5)

## 2012-09-24 LAB — BASIC METABOLIC PANEL
BUN: 14 mg/dL (ref 6–23)
Calcium: 9.5 mg/dL (ref 8.4–10.5)
Creatinine, Ser: 1.13 mg/dL (ref 0.50–1.35)
GFR calc non Af Amer: 80 mL/min — ABNORMAL LOW (ref >90)
Glucose, Bld: 84 mg/dL (ref 70–99)

## 2012-09-24 LAB — URINALYSIS, ROUTINE W REFLEX MICROSCOPIC
Bilirubin Urine: NEGATIVE
Glucose, UA: NEGATIVE mg/dL
Hgb urine dipstick: NEGATIVE
Protein, ur: NEGATIVE mg/dL
Urobilinogen, UA: 0.2 mg/dL (ref 0.0–1.0)

## 2012-09-24 MED ORDER — SODIUM CHLORIDE 0.9 % IV SOLN
Freq: Once | INTRAVENOUS | Status: AC
  Administered 2012-09-24: 20 mL/h via INTRAVENOUS

## 2012-09-24 MED ORDER — DOCUSATE SODIUM 100 MG PO CAPS: 100.0000 mg | ORAL_CAPSULE | Freq: Two times a day (BID) | ORAL | Status: DC | PRN

## 2012-09-24 MED ORDER — HYDROCODONE-ACETAMINOPHEN 5-325 MG PO TABS
1.0000 | ORAL_TABLET | ORAL | Status: DC | PRN
Start: 2012-09-24 — End: 2013-08-29

## 2012-09-24 MED ORDER — HYDROCORTISONE 2.5 % RE CREA: TOPICAL_CREAM | RECTAL | Status: DC

## 2012-09-24 MED ORDER — ONDANSETRON HCL 4 MG/2ML IJ SOLN
4.0000 mg | Freq: Once | INTRAMUSCULAR | Status: AC
  Administered 2012-09-24: 4 mg via INTRAVENOUS
  Filled 2012-09-24: qty 2

## 2012-09-24 MED ORDER — IOHEXOL 300 MG/ML  SOLN
100.0000 mL | Freq: Once | INTRAMUSCULAR | Status: AC | PRN
  Administered 2012-09-24: 100 mL via INTRAVENOUS

## 2012-09-24 MED ORDER — MORPHINE SULFATE 4 MG/ML IJ SOLN
4.0000 mg | Freq: Once | INTRAMUSCULAR | Status: AC
  Administered 2012-09-24: 4 mg via INTRAVENOUS
  Filled 2012-09-24: qty 1

## 2012-09-24 NOTE — ED Provider Notes (Signed)
History     CSN: [id number]  Arrival date & time 09/24/12  1014   First MD Initiated Contact with Patient 09/24/12 1021      Chief Complaint  Patient presents with  . Abscess    l/groin abscess x 4 days  . Hip Pain    l/hip pain x 3 days    (Consider location/radiation/quality/duration/timing/severity/associated sxs/prior treatment) HPI Comments: Patient with perianal swelling that began yesterday, tender to palpation.  Pain is constant, 6/10 when not touched, worse with palpation, sitting, and bowel movements.  Stool is thin and "worm-like."  Is having urinary urgency, slight penile discharge that is "almost clear."  Denies fevers, chills, testicular pain or swelling, dysuria, urinary frequency.  Pt also has chronic left hip bursitis and is currently in pain, denies any change in the quality of his chronic pain.  No new symptoms.  Chronic left lower leg numbness, unchanged.  Pt is HIV+, unsure of last CD4 count but was told it was "awesome" and his viral load was low.  Is seeing PCP and orthopedist for chronic low back and hip pain.    Medical records show last CD4 count was 680 - January 2014.   Patient is a 37 y.o. male presenting with abscess and hip pain. The history is provided by the patient and medical records.  Abscess Associated symptoms: no fever, no nausea and no vomiting   Hip Pain Pertinent negatives include no abdominal pain, chills, fever, nausea, vomiting or weakness.    Past Medical History  Diagnosis Date  . HIV infection   . Hyperlipidemia   . Blood dyscrasia     HIV  . Chronic back pain   . Depression   . Anxiety   . Headache   . Vertigo   . DJD (degenerative joint disease)     Past Surgical History  Procedure Laterality Date  . Wisdom tooth extraction    . Esophagogastroduodenoscopy  03/27/2012    Procedure: ESOPHAGOGASTRODUODENOSCOPY (EGD);  Surgeon: Shirley Friar, MD;  Location: [facility];  Service: Endoscopy;  Laterality: N/A;     Family History  Problem Relation Age of Onset  . Cancer Mother     laryngeal  . Pneumonia Father   . Diabetes Father   . Hypertension Sister   . Crohn's disease Brother   . Crohn's disease Maternal Grandmother   . Cancer Maternal Grandmother   . Cancer Maternal Grandfather     History  Substance Use Topics  . Smoking status: Current Every Day Smoker -- 0.50 packs/day    Types: Cigarettes  . Smokeless tobacco: Never Used  . Alcohol Use: 3.5 oz/week    7 drink(s) per week     Comment: beer at times. not often      Review of Systems  Constitutional: Negative for fever and chills.  Gastrointestinal: Negative for nausea, vomiting, abdominal pain, diarrhea, constipation and blood in stool.  Genitourinary: Positive for urgency and discharge. Negative for dysuria, frequency, scrotal swelling, penile pain and testicular pain.  Musculoskeletal: Positive for back pain. Negative for gait problem.  Skin: Negative for color change.  Neurological: Negative for weakness.    Allergies  Review of patient's allergies indicates no known allergies.  Home Medications   Current Outpatient Rx  Name  Route  Sig  Dispense  Refill  . ARIPiprazole (ABILIFY) 15 MG tablet   Oral   Take 15 mg by mouth daily.         . cyclobenzaprine (FLEXERIL) 10 MG tablet  Oral   Take 1 tablet (10 mg total) by mouth 2 (two) times daily as needed for muscle spasms.   30 tablet   0   . Darunavir Ethanolate (PREZISTA) 800 MG tablet   Oral   Take 1 tablet (800 mg total) by mouth daily.   30 tablet   11   . emtricitabine-tenofovir (TRUVADA) 200-300 MG per tablet   Oral   Take 1 tablet by mouth daily.   30 tablet   11   . escitalopram (LEXAPRO) 20 MG tablet   Oral   Take 20 mg by mouth daily.         Marland Kitchen gabapentin (NEURONTIN) 300 MG capsule   Oral   Take 1 capsule (300 mg total) by mouth 4 (four) times daily.   120 capsule   2   . naproxen (NAPROSYN) 500 MG tablet   Oral   Take 500  mg by mouth 2 (two) times daily with a meal.         . omeprazole (PRILOSEC) 20 MG capsule   Oral   Take 20 mg by mouth 2 (two) times daily.         . ritonavir (NORVIR) 100 MG TABS   Oral   Take 1 tablet (100 mg total) by mouth daily.   30 tablet   11   . traZODone (DESYREL) 50 MG tablet   Oral   Take 50 mg by mouth at bedtime. Mental health         . valACYclovir (VALTREX) 500 MG tablet   Oral   Take 500 mg by mouth 2 (two) times daily as needed. For flare-ups.           BP 120/70  Pulse 97  Temp(Src) 97.5 F (36.4 C) (Oral)  Resp 16  SpO2 96%  Physical Exam  Nursing note and vitals reviewed. Constitutional: He appears well-developed and well-nourished. No distress.  HENT:  Head: Normocephalic and atraumatic.  Neck: Neck supple.  Cardiovascular: Normal rate and regular rhythm.   Pulmonary/Chest: Effort normal and breath sounds normal. No respiratory distress. He has no wheezes. He has no rales.  Abdominal: Soft. He exhibits no distension and no mass. There is tenderness. There is no rebound and no guarding.    Left hip without erythema or warmth.   Genitourinary: Rectal exam shows internal hemorrhoid, mass and tenderness. Rectal exam shows anal tone normal.     Neurological: He is alert. He exhibits normal muscle tone.  Skin: He is not diaphoretic.    ED Course  Procedures (including critical care time)  Labs Reviewed  BASIC METABOLIC PANEL - Abnormal; Notable for the following:    GFR calc non Af Amer 80 (*)    All other components within normal limits  GC/CHLAMYDIA PROBE AMP  CBC WITH DIFFERENTIAL  URINALYSIS, ROUTINE W REFLEX MICROSCOPIC   Ct Pelvis W Contrast  09/24/2012  *RADIOLOGY REPORT*  Clinical Data:  Red, raised painful area of in the perianal region. HIV positive.  CT PELVIS WITH CONTRAST  Technique:  Multidetector CT imaging of the pelvis was performed using the standard protocol following the bolus administration of intravenous  contrast.  Contrast: OMNIPAQUE IOHEXOL 300 MG/ML  SOLN  Comparison:  07/31/2007.  Findings:  On the CT images, there is no visible soft tissue swelling.  No soft tissue gas or fluid collections are seen. Normal appearing bones.  No intestinal abnormalities or enlarged lymph nodes.  Unremarkable urinary bladder and prostate gland.  IMPRESSION:  Normal examination.   Original Report Authenticated By: Beckie Salts, M.D.      1. Internal hemorrhoids     MDM  Pt with hx HIV, CD4 count over 600, p/w perianal tenderness, concern for abscess.  Pt with hx abscess that was in similar location.  Pt tender around anus and into perianal area.  I was concerned for perianal or perirectal abscess and therefore ordered labs and CT.  CT showed no collection or inflammation concerning for abscess.  On reexamination after morphine was given, pt did have tender internal hemorrhoids.  Pt also noted urinary urgency and chronic penile discharge, I therefore ordered a UA and urine GC/Chlam.  Labs and UA unremarkable.  GC/Chlam pending.  Pt d/c home with pain medication, stool softener, anusol presriptions, general surgery referral.  Discussed all results with patient.  Pt given return precautions.  Pt verbalizes understanding and agrees with plan.          Trixie Dredge, PA-C 09/24/12 1339

## 2012-09-24 NOTE — ED Notes (Signed)
Patient transported to CT 

## 2012-09-24 NOTE — ED Provider Notes (Signed)
Medical screening examination/treatment/procedure(s) were performed by non-physician practitioner and as supervising physician I was immediately available for consultation/collaboration.  Jakeia Carreras R. Verona Hartshorn, MD 09/24/12 1618 

## 2012-09-24 NOTE — ED Notes (Signed)
Red, raised, painful abscess in perirectal area. Pt reports severe increase in pain on palpation. Pain goes from 4 to 10 Also, reports recurrent l/hip pain x 3 days

## 2012-09-24 NOTE — ED Notes (Signed)
Pt advised that due to use of narcotic at 1140. He may not drive until 8119. Pt stated that he has found transportation and will not drive

## 2012-09-26 LAB — GC/CHLAMYDIA PROBE AMP: GC Probe RNA: NEGATIVE

## 2012-09-27 ENCOUNTER — Emergency Department (HOSPITAL_COMMUNITY)
Admission: EM | Admit: 2012-09-27 | Discharge: 2012-09-28 | Disposition: A | Discharge status: Home / Self Care | Payer: No Typology Code available for payment source | Attending: Emergency Medicine | Admitting: Emergency Medicine

## 2012-09-27 ENCOUNTER — Ambulatory Visit (INDEPENDENT_AMBULATORY_CARE_PROVIDER_SITE_OTHER): Payer: Self-pay | Admitting: General Surgery

## 2012-09-27 ENCOUNTER — Ambulatory Visit (INDEPENDENT_AMBULATORY_CARE_PROVIDER_SITE_OTHER): Payer: No Typology Code available for payment source | Admitting: Internal Medicine

## 2012-09-27 ENCOUNTER — Telehealth: Payer: Self-pay | Admitting: *Deleted

## 2012-09-27 ENCOUNTER — Encounter (HOSPITAL_COMMUNITY): Payer: Self-pay

## 2012-09-27 VITALS — BP 113/73 | HR 84 | Temp 98.8°F | Wt 211.0 lb

## 2012-09-27 DIAGNOSIS — K649 Unspecified hemorrhoids: Secondary | ICD-10-CM

## 2012-09-27 DIAGNOSIS — K610 Anal abscess: Secondary | ICD-10-CM

## 2012-09-27 DIAGNOSIS — Z862 Personal history of diseases of the blood and blood-forming organs and certain disorders involving the immune mechanism: Secondary | ICD-10-CM | POA: Insufficient documentation

## 2012-09-27 DIAGNOSIS — Z8739 Personal history of other diseases of the musculoskeletal system and connective tissue: Secondary | ICD-10-CM | POA: Insufficient documentation

## 2012-09-27 DIAGNOSIS — F411 Generalized anxiety disorder: Secondary | ICD-10-CM | POA: Insufficient documentation

## 2012-09-27 DIAGNOSIS — F3289 Other specified depressive episodes: Secondary | ICD-10-CM | POA: Insufficient documentation

## 2012-09-27 DIAGNOSIS — G8929 Other chronic pain: Secondary | ICD-10-CM | POA: Insufficient documentation

## 2012-09-27 DIAGNOSIS — K047 Periapical abscess without sinus: Secondary | ICD-10-CM | POA: Insufficient documentation

## 2012-09-27 DIAGNOSIS — M549 Dorsalgia, unspecified: Secondary | ICD-10-CM | POA: Insufficient documentation

## 2012-09-27 DIAGNOSIS — F329 Major depressive disorder, single episode, unspecified: Secondary | ICD-10-CM | POA: Insufficient documentation

## 2012-09-27 DIAGNOSIS — Z79899 Other long term (current) drug therapy: Secondary | ICD-10-CM | POA: Insufficient documentation

## 2012-09-27 DIAGNOSIS — F17200 Nicotine dependence, unspecified, uncomplicated: Secondary | ICD-10-CM | POA: Insufficient documentation

## 2012-09-27 DIAGNOSIS — Z21 Asymptomatic human immunodeficiency virus [HIV] infection status: Secondary | ICD-10-CM | POA: Insufficient documentation

## 2012-09-27 DIAGNOSIS — E785 Hyperlipidemia, unspecified: Secondary | ICD-10-CM | POA: Insufficient documentation

## 2012-09-27 MED ORDER — HYDROCORTISONE ACETATE 25 MG RE SUPP: 25.0000 mg | Freq: Two times a day (BID) | RECTAL | Status: DC

## 2012-09-27 NOTE — Assessment & Plan Note (Signed)
He was noted to have mass and internal hemorrhoids. He will be referred to surgery. I will give him a prescription for Anusol suppository as well and tramadol for pain relief.

## 2012-09-27 NOTE — Telephone Encounter (Signed)
Called [name] to schedule an appt with Outpatient Surgery spoke with Casimiro Needle and was given 10/02/12 at 845 am. They require a $20 copay in order to have a visit. They will also send him out a new patient packet with address, phone number, and maps. Request we fax the office note and any test we have recently run on the patient. Fax number is [phone]

## 2012-09-27 NOTE — Progress Notes (Signed)
  Subjective:    Patient ID: Shawn Brooks, male    DOB: 06-Mar-1976, 37 y.o.   MRN: [id number]  HPI He comes in for hospital emergency room followup. He is an HIV patient well controlled on Prezista, Norvir and Truvada.  His last viral load was 39 copies and CD4 over 600. He denies any missed doses. He was seen in the emergency room of 4 rectal pain and noted to have internal hemorrhoids. CT scan was done because of some mild discharge however did not show any abscess collection or other concerning fluid collections. He has had no fever or chills. He has difficulty sitting and is having significant pain associated with this. He was given topical Anusol which has had no relief.  His hemoglobin is stable.   Review of Systems  Constitutional: Negative for fever and fatigue.  Gastrointestinal: Positive for rectal pain. Negative for nausea, diarrhea and anal bleeding.       Objective:   Physical Exam  Constitutional: He appears well-developed and well-nourished. No distress.          Assessment & Plan:

## 2012-09-27 NOTE — ED Notes (Signed)
Pt was dx with hemorroids on Sunday, now he states that his rectum is more swollen, hasn't had a BM in two days, pt states the pain is severe. Has an appt on Monday for surgery at [facility].

## 2012-09-27 NOTE — Telephone Encounter (Signed)
Called the patient and advised him of the appt and the copay. Also advised him to call the office if he has any questions or concerns.

## 2012-09-28 ENCOUNTER — Emergency Department (HOSPITAL_COMMUNITY): Payer: No Typology Code available for payment source

## 2012-09-28 ENCOUNTER — Telehealth: Payer: Self-pay | Admitting: *Deleted

## 2012-09-28 ENCOUNTER — Encounter (HOSPITAL_COMMUNITY): Payer: Self-pay

## 2012-09-28 DIAGNOSIS — K612 Anorectal abscess: Secondary | ICD-10-CM

## 2012-09-28 LAB — BASIC METABOLIC PANEL
BUN: 12 mg/dL (ref 6–23)
Chloride: 99 mEq/L (ref 96–112)
Creatinine, Ser: 1.2 mg/dL (ref 0.50–1.35)
GFR calc Af Amer: 87 mL/min — ABNORMAL LOW (ref >90)
Glucose, Bld: 87 mg/dL (ref 70–99)
Potassium: 4.3 mEq/L (ref 3.5–5.1)

## 2012-09-28 LAB — CBC WITH DIFFERENTIAL/PLATELET
Basophils Relative: 0 % (ref 0–1)
HCT: 42.5 % (ref 39.0–52.0)
Hemoglobin: 14.7 g/dL (ref 13.0–17.0)
Lymphs Abs: 1.9 10*3/uL (ref 0.7–4.0)
MCH: 31.6 pg (ref 26.0–34.0)
MCHC: 34.6 g/dL (ref 30.0–36.0)
Monocytes Absolute: 1.6 10*3/uL — ABNORMAL HIGH (ref 0.1–1.0)
Monocytes Relative: 9 % (ref 3–12)
Neutro Abs: 14.2 10*3/uL — ABNORMAL HIGH (ref 1.7–7.7)
RBC: 4.65 MIL/uL (ref 4.22–5.81)

## 2012-09-28 MED ORDER — SODIUM CHLORIDE 0.9 % IV SOLN: 1000.0000 mL | INTRAVENOUS | Status: DC

## 2012-09-28 MED ORDER — SODIUM CHLORIDE 0.9 % IV SOLN
1000.0000 mL | Freq: Once | INTRAVENOUS | Status: AC
  Administered 2012-09-28: 1000 mL via INTRAVENOUS

## 2012-09-28 MED ORDER — HYDROMORPHONE HCL PF 1 MG/ML IJ SOLN
1.0000 mg | Freq: Once | INTRAMUSCULAR | Status: AC
  Administered 2012-09-28: 1 mg via INTRAVENOUS
  Filled 2012-09-28: qty 1

## 2012-09-28 MED ORDER — SULFAMETHOXAZOLE-TRIMETHOPRIM 800-160 MG PO TABS: 1.0000 | ORAL_TABLET | Freq: Two times a day (BID) | ORAL | Status: DC

## 2012-09-28 MED ORDER — SODIUM CHLORIDE 0.9 % IV SOLN
INTRAVENOUS | Status: DC
  Administered 2012-09-28: 06:00:00 via INTRAVENOUS

## 2012-09-28 MED ORDER — MORPHINE SULFATE 4 MG/ML IJ SOLN
4.0000 mg | Freq: Once | INTRAMUSCULAR | Status: AC
  Administered 2012-09-28: 4 mg via INTRAVENOUS
  Filled 2012-09-28: qty 1

## 2012-09-28 MED ORDER — OXYCODONE-ACETAMINOPHEN 5-325 MG PO TABS: 1.0000 | ORAL_TABLET | ORAL | Status: DC | PRN

## 2012-09-28 MED ORDER — IOHEXOL 300 MG/ML  SOLN
100.0000 mL | Freq: Once | INTRAMUSCULAR | Status: AC | PRN
  Administered 2012-09-28: 100 mL via INTRAVENOUS

## 2012-09-28 MED ORDER — IOHEXOL 300 MG/ML  SOLN
50.0000 mL | Freq: Once | INTRAMUSCULAR | Status: AC | PRN
  Administered 2012-09-28: 50 mL via ORAL

## 2012-09-28 NOTE — Consult Note (Signed)
Reason for Consult:perianal abscess Referring Physician: Dr Patria Mane  Shawn Brooks is an 37 y.o. male.  HPI: Patient states several days of worsening perianal pain.  Seen in the ED a couple nights ago, and no fluid collection present on CT.  Now with 1.7x2.7cm abscess on CT.  Pt states low grade fevers and chills.  No abd pain.  No urinary retention.    Past Medical History  Diagnosis Date  . HIV infection   . Hyperlipidemia   . Blood dyscrasia     HIV  . Chronic back pain   . Depression   . Anxiety   . Headache   . Vertigo   . DJD (degenerative joint disease)     Past Surgical History  Procedure Laterality Date  . Wisdom tooth extraction    . Esophagogastroduodenoscopy  03/27/2012    Procedure: ESOPHAGOGASTRODUODENOSCOPY (EGD);  Surgeon: Shirley Friar, MD;  Location: [facility];  Service: Endoscopy;  Laterality: N/A;    Family History  Problem Relation Age of Onset  . Cancer Mother     laryngeal  . Pneumonia Father   . Diabetes Father   . Hypertension Sister   . Crohn's disease Brother   . Crohn's disease Maternal Grandmother   . Cancer Maternal Grandmother   . Cancer Maternal Grandfather     Social History:  reports that he has been smoking Cigarettes.  He has been smoking about 0.50 packs per day. He has never used smokeless tobacco. He reports that he drinks about 3.5 ounces of alcohol per week. He reports that he uses illicit drugs (Marijuana) about 7 times per week.  Allergies: No Known Allergies  Medications: I have reviewed the patient's current medications.  Results for orders placed during the hospital encounter of 09/27/12 (from the past 48 hour(s))  CBC WITH DIFFERENTIAL     Status: Abnormal   Collection Time    09/28/12  1:27 AM      Result Value Range   WBC 17.7 (*) 4.0 - 10.5 K/uL   RBC 4.65  4.22 - 5.81 MIL/uL   Hemoglobin 14.7  13.0 - 17.0 g/dL   HCT 16.1  09.6 - 04.5 %   MCV 91.4  78.0 - 100.0 fL   MCH 31.6  26.0 - 34.0 pg   MCHC 34.6   30.0 - 36.0 g/dL   RDW 40.9  81.1 - 91.4 %   Platelets 183  150 - 400 K/uL   Neutrophils Relative 80 (*) 43 - 77 %   Neutro Abs 14.2 (*) 1.7 - 7.7 K/uL   Lymphocytes Relative 11 (*) 12 - 46 %   Lymphs Abs 1.9  0.7 - 4.0 K/uL   Monocytes Relative 9  3 - 12 %   Monocytes Absolute 1.6 (*) 0.1 - 1.0 K/uL   Eosinophils Relative 0  0 - 5 %   Eosinophils Absolute 0.1  0.0 - 0.7 K/uL   Basophils Relative 0  0 - 1 %   Basophils Absolute 0.0  0.0 - 0.1 K/uL  BASIC METABOLIC PANEL     Status: Abnormal   Collection Time    09/28/12  1:27 AM      Result Value Range   Sodium 135  135 - 145 mEq/L   Potassium 4.3  3.5 - 5.1 mEq/L   Chloride 99  96 - 112 mEq/L   CO2 27  19 - 32 mEq/L   Glucose, Bld 87  70 - 99 mg/dL   BUN 12  6 - 23 mg/dL   Creatinine, Ser 0.45  0.50 - 1.35 mg/dL   Calcium 9.1  8.4 - 40.9 mg/dL   GFR calc non Af Amer 75 (*) >90 mL/min   GFR calc Af Amer 87 (*) >90 mL/min   Comment:            The eGFR has been calculated     using the CKD EPI equation.     This calculation has not been     validated in all clinical     situations.     eGFR's persistently     <90 mL/min signify     possible Chronic Kidney Disease.    Ct Pelvis W Contrast  09/28/2012  *RADIOLOGY REPORT*  Clinical Data:  Rectal and hemorrhoidal pain.  Scrotal and rectal swelling.  White cell count 17.7.  Positive HIV.  CT PELVIS WITH CONTRAST  Technique:  Multidetector CT imaging of the pelvis was performed using the standard protocol following the bolus administration of intravenous contrast.  Contrast: OMNIPAQUE IOHEXOL 300 MG/ML  SOLN  Comparison:  CT pelvis 09/24/2012  Findings:  There is minimal infiltration in the perineal fat along the left side the gluteal fold which is developing since previous study.  This may represent early infiltration or cellulitis. Small developing fluid collection along the left peri anal tissues which could represent abscess or large hemorrhoid tissues.  This measures about  1.7 x 2.7 cm. The lesion arises just below the skin surface and should be palpable on physical examination.  These changes have developed since the last study.  No visualized scrotal skin edema. Pelvic contents are unremarkable.  Prostate gland is not enlarged. Bladder wall is not thickened.  No free or loculated pelvic fluid collections.  The appendix is normal.  No evidence of diverticulitis.  IMPRESSION: Back since the previous study, there is interval development of soft tissue infiltration in the peroneal fat adjacent to the left gluteal fold with focal fluid collection developing in the perianal/gluteal tissues on the left.  Fluid collection may represent a perianal abscess versus prominent hemorrhoidal tissue.   Original Report Authenticated By: Burman Nieves, M.D.     Review of Systems  Constitutional: Positive for fever and chills.  Respiratory: Negative for cough and shortness of breath.   Cardiovascular: Negative for chest pain.  Gastrointestinal: Negative for nausea, vomiting and abdominal pain.  Genitourinary: Negative for dysuria.  Skin: Negative for itching.  Neurological: Negative for headaches.  Endo/Heme/Allergies: Does not bruise/bleed easily.   Blood pressure 115/58, pulse 71, temperature 98.6 F (37 C), temperature source Oral, resp. rate 18, SpO2 98.00%. Physical Exam  Constitutional: He is oriented to person, place, and time. He appears well-developed and well-nourished.  HENT:  Head: Normocephalic and atraumatic.  Eyes: Conjunctivae are normal. Pupils are equal, round, and reactive to light.  Neck: Normal range of motion.  Cardiovascular: Normal rate and regular rhythm.   Respiratory: Effort normal and breath sounds normal.  GI: Soft. There is no tenderness.  R perianal abscess  Neurological: He is alert and oriented to person, place, and time.  Skin: Skin is warm and dry.   Procedure: I&D Surgeon: Maisie Fus Assistant: ED RN After the risks and benefits were  explained, verbal consent was obtained for above procedure  Anesthesia: none Diagnosis: perirectal abscess Procedure: the patient was medicated with IV Morphine and 1% lidocaine was infused subcutaneously.  The abscess was opened in a cruciate manner.  The edges were trimmed.  A large  amount of purulent material was expressed.  The area was left open to drain. Findings: Perianal abscess  Assessment/Plan: Shawn Brooks is a 37 y.o. M with well-controlled HIV who was found to have a perirectal abscess.  This was drained at bedside.  He should do Sitz baths TID and after BM's.  He can follow up with me in the office as needed.    Deagan Sevin C. 09/28/2012, 6:33 AM

## 2012-09-28 NOTE — ED Notes (Addendum)
Pt states he has been having the pain in his rectum for about 7 days. LBM was 09/26/12. "BM was normal. Denies any rectal bleeding the past 2 days but has had some bleeding within the past week. Pt states he has not seen a medical provider regarding this problem but has an upcoming appointment at [facility] on Monday but pain has drastically increased, unable to sleep, walk, or lay in any position other than side or stomach to get min. Relief." Scrotal edema and rectum edema/enlarged nodule noted.

## 2012-09-28 NOTE — ED Provider Notes (Addendum)
History     CSN: [id number]  Arrival date & time 09/27/12  2301   First MD Initiated Contact with Patient 09/28/12 0054      Chief Complaint  Patient presents with  . Rectal Pain    The history is provided by the patient and medical records.   the patient was seen emergency room several days ago for rectal pain and a CT scan demonstrated no abnormalities at that time.  He was thought to have a large internal hemorrhoid was sent to general surgery for followup.  He presents the emergency department today because of worsening rectal pain and new fever and chills over the past 24-36 hours.  He reports increased swelling to his right perianal area.  No drainage.  No abdominal pain nausea vomiting or diarrhea.  He states his been constipated for 2 days.  Symptoms are moderate to severe in severity.  Worse when he sits down.  Worse with palpation Past Medical History  Diagnosis Date  . HIV infection   . Hyperlipidemia   . Blood dyscrasia     HIV  . Chronic back pain   . Depression   . Anxiety   . Headache   . Vertigo   . DJD (degenerative joint disease)     Past Surgical History  Procedure Laterality Date  . Wisdom tooth extraction    . Esophagogastroduodenoscopy  03/27/2012    Procedure: ESOPHAGOGASTRODUODENOSCOPY (EGD);  Surgeon: Shirley Friar, MD;  Location: [facility];  Service: Endoscopy;  Laterality: N/A;    Family History  Problem Relation Age of Onset  . Cancer Mother     laryngeal  . Pneumonia Father   . Diabetes Father   . Hypertension Sister   . Crohn's disease Brother   . Crohn's disease Maternal Grandmother   . Cancer Maternal Grandmother   . Cancer Maternal Grandfather     History  Substance Use Topics  . Smoking status: Current Every Day Smoker -- 0.50 packs/day    Types: Cigarettes  . Smokeless tobacco: Never Used  . Alcohol Use: 3.5 oz/week    7 drink(s) per week     Comment: beer at times. not often      Review of Systems  All other  systems reviewed and are negative.    Allergies  Review of patient's allergies indicates no known allergies.  Home Medications   Current Outpatient Rx  Name  Route  Sig  Dispense  Refill  . ARIPiprazole (ABILIFY) 15 MG tablet   Oral   Take 15 mg by mouth daily.         . Darunavir Ethanolate (PREZISTA) 800 MG tablet   Oral   Take 1 tablet (800 mg total) by mouth daily.   30 tablet   11   . docusate sodium (COLACE) 100 MG capsule   Oral   Take 1 capsule (100 mg total) by mouth 2 (two) times daily as needed for constipation.   30 capsule   0   . emtricitabine-tenofovir (TRUVADA) 200-300 MG per tablet   Oral   Take 1 tablet by mouth daily.   30 tablet   11   . escitalopram (LEXAPRO) 20 MG tablet   Oral   Take 20 mg by mouth daily.         Marland Kitchen gabapentin (NEURONTIN) 300 MG capsule   Oral   Take 1 capsule (300 mg total) by mouth 4 (four) times daily.   120 capsule   2   .  HYDROcodone-acetaminophen (NORCO/VICODIN) 5-325 MG per tablet   Oral   Take 1 tablet by mouth every 4 (four) hours as needed for pain.   15 tablet   0   . hydrocortisone (ANUSOL-HC) 2.5 % rectal cream      Apply rectally 2 times daily   30 g   0   . hydrocortisone (ANUSOL-HC) 25 MG suppository   Rectal   Place 1 suppository (25 mg total) rectally 2 (two) times daily.   12 suppository   2   . naproxen (NAPROSYN) 500 MG tablet   Oral   Take 500 mg by mouth 2 (two) times daily with a meal.         . omeprazole (PRILOSEC) 20 MG capsule   Oral   Take 20 mg by mouth 2 (two) times daily.         . ritonavir (NORVIR) 100 MG TABS   Oral   Take 1 tablet (100 mg total) by mouth daily.   30 tablet   11   . traZODone (DESYREL) 50 MG tablet   Oral   Take 50 mg by mouth at bedtime. Mental health         . valACYclovir (VALTREX) 500 MG tablet   Oral   Take 500 mg by mouth 2 (two) times daily as needed. For flare-ups.           BP 115/58  Pulse 71  Temp(Src) 98.6 F (37 C)  (Oral)  Resp 18  SpO2 98%  Physical Exam  Nursing note and vitals reviewed. Constitutional: He is oriented to person, place, and time. He appears well-developed and well-nourished.  HENT:  Head: Normocephalic and atraumatic.  Eyes: EOM are normal.  Neck: Normal range of motion.  Cardiovascular: Normal rate, regular rhythm, normal heart sounds and intact distal pulses.   Pulmonary/Chest: Effort normal and breath sounds normal. No respiratory distress.  Abdominal: Soft. He exhibits no distension. There is no tenderness.  Genitourinary:  Large fluctuant mass of his right perianal area with involvement of the perineum.  No scrotal involvement.  There is erythema warmth and tenderness to this area.  The most fluctuant areas right next to the anal verge itself.  No drainage  Musculoskeletal: Normal range of motion.  Neurological: He is alert and oriented to person, place, and time.  Skin: Skin is warm and dry.  Psychiatric: He has a normal mood and affect. Judgment normal.    ED Course  Procedures (including critical care time)  Labs Reviewed  CBC WITH DIFFERENTIAL - Abnormal; Notable for the following:    WBC 17.7 (*)    Neutrophils Relative 80 (*)    Neutro Abs 14.2 (*)    Lymphocytes Relative 11 (*)    Monocytes Absolute 1.6 (*)    All other components within normal limits  BASIC METABOLIC PANEL - Abnormal; Notable for the following:    GFR calc non Af Amer 75 (*)    GFR calc Af Amer 87 (*)    All other components within normal limits   Ct Pelvis W Contrast  09/28/2012  *RADIOLOGY REPORT*  Clinical Data:  Rectal and hemorrhoidal pain.  Scrotal and rectal swelling.  White cell count 17.7.  Positive HIV.  CT PELVIS WITH CONTRAST  Technique:  Multidetector CT imaging of the pelvis was performed using the standard protocol following the bolus administration of intravenous contrast.  Contrast: OMNIPAQUE IOHEXOL 300 MG/ML  SOLN  Comparison:  CT pelvis 09/24/2012  Findings:  There  is minimal infiltration in the perineal fat along the left side the gluteal fold which is developing since previous study.  This may represent early infiltration or cellulitis. Small developing fluid collection along the left peri anal tissues which could represent abscess or large hemorrhoid tissues.  This measures about 1.7 x 2.7 cm. The lesion arises just below the skin surface and should be palpable on physical examination.  These changes have developed since the last study.  No visualized scrotal skin edema. Pelvic contents are unremarkable.  Prostate gland is not enlarged. Bladder wall is not thickened.  No free or loculated pelvic fluid collections.  The appendix is normal.  No evidence of diverticulitis.  IMPRESSION: Back since the previous study, there is interval development of soft tissue infiltration in the peroneal fat adjacent to the left gluteal fold with focal fluid collection developing in the perianal/gluteal tissues on the left.  Fluid collection may represent a perianal abscess versus prominent hemorrhoidal tissue.   Original Report Authenticated By: Burman Nieves, M.D.    I personally reviewed the imaging tests through PACS system I reviewed available ER/hospitalization records through the EMR   No diagnosis found.    MDM  Large right sided perianal abscess with fluctuance and early involvement of the perineum.  Given the size and location of mass general surgery to evaluate the patient the bedside for possible incision and drainage in the operating room.  Dr. Maisie Fus will evaluate in the morning        Lyanne Co, MD 09/28/12 [phone]  Patient was seen and evaluated by Dr. Maisie Fus of general surgery.  She performed an incision and drainage at the bedside.  She recommends warm soaks.  Discharge home with antibiotics and pain medicine.  Lyanne Co, MD 09/28/12 [phone]

## 2012-09-28 NOTE — Telephone Encounter (Signed)
Called patient to find out how he is doing as he was very uncomfortable in office yesterday and to make sure he got the message about his appt with [facility]. He advised that after he left here yesterday that he went to the ED as his pain was worse and the area started to drain. He advised that the ED drained his abscess and he feels much better. I asked if he thought he would go to the visit that was scheduled and he advised not to cancel it yet as he may need to establish with them. Advised him of the phone number and to call if he does not think he will keep it. Advised him to take the colace he was given and increase his fluids. Also advised him to call the office if he has any other questions or concerns.

## 2012-10-02 DIAGNOSIS — K611 Rectal abscess: Secondary | ICD-10-CM | POA: Insufficient documentation

## 2012-10-02 DIAGNOSIS — K612 Anorectal abscess: Secondary | ICD-10-CM | POA: Insufficient documentation

## 2012-10-02 DIAGNOSIS — B20 Human immunodeficiency virus [HIV] disease: Secondary | ICD-10-CM | POA: Insufficient documentation

## 2012-10-03 ENCOUNTER — Ambulatory Visit (INDEPENDENT_AMBULATORY_CARE_PROVIDER_SITE_OTHER): Payer: Self-pay | Admitting: General Surgery

## 2012-10-03 ENCOUNTER — Emergency Department (HOSPITAL_COMMUNITY)
Admission: EM | Admit: 2012-10-03 | Discharge: 2012-10-04 | Disposition: A | Discharge status: Home / Self Care | Payer: No Typology Code available for payment source | Attending: Emergency Medicine | Admitting: Emergency Medicine

## 2012-10-03 ENCOUNTER — Encounter (HOSPITAL_COMMUNITY): Payer: Self-pay

## 2012-10-03 DIAGNOSIS — Z21 Asymptomatic human immunodeficiency virus [HIV] infection status: Secondary | ICD-10-CM | POA: Insufficient documentation

## 2012-10-03 DIAGNOSIS — K6289 Other specified diseases of anus and rectum: Secondary | ICD-10-CM | POA: Insufficient documentation

## 2012-10-03 DIAGNOSIS — F329 Major depressive disorder, single episode, unspecified: Secondary | ICD-10-CM | POA: Insufficient documentation

## 2012-10-03 DIAGNOSIS — K611 Rectal abscess: Secondary | ICD-10-CM

## 2012-10-03 DIAGNOSIS — F3289 Other specified depressive episodes: Secondary | ICD-10-CM | POA: Insufficient documentation

## 2012-10-03 DIAGNOSIS — E785 Hyperlipidemia, unspecified: Secondary | ICD-10-CM | POA: Insufficient documentation

## 2012-10-03 DIAGNOSIS — F411 Generalized anxiety disorder: Secondary | ICD-10-CM | POA: Insufficient documentation

## 2012-10-03 DIAGNOSIS — G8929 Other chronic pain: Secondary | ICD-10-CM | POA: Insufficient documentation

## 2012-10-03 DIAGNOSIS — Z79899 Other long term (current) drug therapy: Secondary | ICD-10-CM | POA: Insufficient documentation

## 2012-10-03 DIAGNOSIS — F17200 Nicotine dependence, unspecified, uncomplicated: Secondary | ICD-10-CM | POA: Insufficient documentation

## 2012-10-03 DIAGNOSIS — K612 Anorectal abscess: Secondary | ICD-10-CM | POA: Insufficient documentation

## 2012-10-03 DIAGNOSIS — Z8739 Personal history of other diseases of the musculoskeletal system and connective tissue: Secondary | ICD-10-CM | POA: Insufficient documentation

## 2012-10-03 MED ORDER — SULFAMETHOXAZOLE-TRIMETHOPRIM 800-160 MG PO TABS: 1.0000 | ORAL_TABLET | Freq: Two times a day (BID) | ORAL | Status: DC

## 2012-10-03 NOTE — ED Provider Notes (Signed)
History     CSN: [id number]  Arrival date & time 10/03/12  2207   First MD Initiated Contact with Patient 10/03/12 2316      Chief Complaint  Patient presents with  . Post-op Problem    (Consider location/radiation/quality/duration/timing/severity/associated sxs/prior treatment) HPI HX per PT. Perirectal abscess with I/D by GSU here 09/27/12- he was not able to f/u in the clinic. His PCP scheduled him to see [facility] at [facility].  Evaluated by [facility] at [facility] yesterday - has scope performed at that time.  No fevers. No chills. Taking ABx and stool softner - no sig pain last few days until tonight with BM.  PT worried he was passing stool through incision. No constipation. Pain sharp and severe earlier now slight discomfort. No ABD pain. No n/v. No blood in stools.    Past Medical History  Diagnosis Date  . HIV infection   . Hyperlipidemia   . Blood dyscrasia     HIV  . Chronic back pain   . Depression   . Anxiety   . Headache   . Vertigo   . DJD (degenerative joint disease)     Past Surgical History  Procedure Laterality Date  . Wisdom tooth extraction    . Esophagogastroduodenoscopy  03/27/2012    Procedure: ESOPHAGOGASTRODUODENOSCOPY (EGD);  Surgeon: Shirley Friar, MD;  Location: [facility];  Service: Endoscopy;  Laterality: N/A;    Family History  Problem Relation Age of Onset  . Cancer Mother     laryngeal  . Pneumonia Father   . Diabetes Father   . Hypertension Sister   . Crohn's disease Brother   . Crohn's disease Maternal Grandmother   . Cancer Maternal Grandmother   . Cancer Maternal Grandfather     History  Substance Use Topics  . Smoking status: Current Every Day Smoker -- 0.50 packs/day    Types: Cigarettes  . Smokeless tobacco: Never Used  . Alcohol Use: 3.5 oz/week    7 drink(s) per week     Comment: beer at times. not often      Review of Systems  Constitutional: Negative for fever and chills.  HENT: Negative for neck pain and neck  stiffness.   Eyes: Negative for pain.  Respiratory: Negative for shortness of breath.   Cardiovascular: Negative for chest pain.  Gastrointestinal: Positive for rectal pain. Negative for abdominal pain.  Genitourinary: Negative for dysuria.  Musculoskeletal: Negative for back pain.  Skin: Negative for rash.  Neurological: Negative for headaches.  All other systems reviewed and are negative.    Allergies  Review of patient's allergies indicates no known allergies.  Home Medications   Current Outpatient Rx  Name  Route  Sig  Dispense  Refill  . ARIPiprazole (ABILIFY) 15 MG tablet   Oral   Take 15 mg by mouth daily.         . Darunavir Ethanolate (PREZISTA) 800 MG tablet   Oral   Take 1 tablet (800 mg total) by mouth daily.   30 tablet   11   . docusate sodium (COLACE) 100 MG capsule   Oral   Take 1 capsule (100 mg total) by mouth 2 (two) times daily as needed for constipation.   30 capsule   0   . emtricitabine-tenofovir (TRUVADA) 200-300 MG per tablet   Oral   Take 1 tablet by mouth daily.   30 tablet   11   . escitalopram (LEXAPRO) 20 MG tablet   Oral  Take 20 mg by mouth daily.         Marland Kitchen gabapentin (NEURONTIN) 300 MG capsule   Oral   Take 1 capsule (300 mg total) by mouth 4 (four) times daily.   120 capsule   2   . HYDROcodone-acetaminophen (NORCO/VICODIN) 5-325 MG per tablet   Oral   Take 1 tablet by mouth every 4 (four) hours as needed for pain.   15 tablet   0   . hydrocortisone (ANUSOL-HC) 25 MG suppository   Rectal   Place 1 suppository (25 mg total) rectally 2 (two) times daily.   12 suppository   2   . naproxen (NAPROSYN) 500 MG tablet   Oral   Take 500 mg by mouth 2 (two) times daily with a meal.         . omeprazole (PRILOSEC) 20 MG capsule   Oral   Take 20 mg by mouth 2 (two) times daily.         . ritonavir (NORVIR) 100 MG TABS   Oral   Take 1 tablet (100 mg total) by mouth daily.   30 tablet   11   .  sulfamethoxazole-trimethoprim (SEPTRA DS) 800-160 MG per tablet   Oral   Take 1 tablet by mouth every 12 (twelve) hours.   14 tablet   0   . traZODone (DESYREL) 50 MG tablet   Oral   Take 50 mg by mouth at bedtime. Mental health         . valACYclovir (VALTREX) 500 MG tablet   Oral   Take 500 mg by mouth 2 (two) times daily as needed. For flare-ups.         Marland Kitchen oxyCODONE-acetaminophen (PERCOCET/ROXICET) 5-325 MG per tablet   Oral   Take 1 tablet by mouth every 4 (four) hours as needed for pain.   20 tablet   0     BP 119/71  Pulse 76  Temp(Src) 98.4 F (36.9 C) (Oral)  Resp 20  SpO2 98%  Physical Exam  Constitutional: He is oriented to person, place, and time. He appears well-developed and well-nourished.  HENT:  Head: Normocephalic and atraumatic.  Eyes: EOM are normal. Pupils are equal, round, and reactive to light.  Neck: Neck supple.  Cardiovascular: Normal rate, regular rhythm and intact distal pulses.   Pulmonary/Chest: Effort normal and breath sounds normal. No respiratory distress.  Abdominal: Soft. Bowel sounds are normal. He exhibits no distension. There is no tenderness.  Genitourinary:  Perirectal abscess, TTP, incision is open, no drainage, sl drainage on 4 x 4. No surrounding erythema, fluctuance or tenderness.   Musculoskeletal: Normal range of motion. He exhibits no edema.  Neurological: He is alert and oriented to person, place, and time.  Skin: Skin is warm and dry.    ED Course  Procedures (including critical care time)  Dressing replaced.   Precautions provided  PT plans to f/u [facility] at [facility] - will call in am  MDM  Healing perirectal abscess  VS WNL  Plan continue warm soaks, ABx and stools softners.       Sunnie Nielsen, MD 10/06/12 [phone]

## 2012-10-03 NOTE — ED Notes (Signed)
Pt had perirectal abscess lanced recently.  Pt states tonight he thought he saw bowel movement coming out of abscess opening.

## 2012-11-02 ENCOUNTER — Telehealth: Payer: Self-pay | Admitting: *Deleted

## 2012-11-02 ENCOUNTER — Encounter: Payer: Self-pay | Admitting: *Deleted

## 2012-11-02 NOTE — Telephone Encounter (Signed)
Pharmacy called requesting refill for discontinued rx.  Levaquin, originally rxed by Dr. Daiva Eves for prostatitis 10/13 for 2 months.  Pt will need to be seen by MD for evaluation.  RN will call the pt and set up appt. Left message requesting pt to call for appt to evaluate need for Levaquin refill

## 2012-11-07 ENCOUNTER — Ambulatory Visit (INDEPENDENT_AMBULATORY_CARE_PROVIDER_SITE_OTHER): Payer: No Typology Code available for payment source | Admitting: Infectious Disease

## 2012-11-07 ENCOUNTER — Encounter: Payer: Self-pay | Admitting: Infectious Disease

## 2012-11-07 VITALS — BP 114/73 | HR 79 | Temp 98.7°F | Ht 68.75 in | Wt 217.5 lb

## 2012-11-07 DIAGNOSIS — K612 Anorectal abscess: Secondary | ICD-10-CM

## 2012-11-07 DIAGNOSIS — B20 Human immunodeficiency virus [HIV] disease: Secondary | ICD-10-CM

## 2012-11-07 DIAGNOSIS — Z113 Encounter for screening for infections with a predominantly sexual mode of transmission: Secondary | ICD-10-CM

## 2012-11-07 DIAGNOSIS — K625 Hemorrhage of anus and rectum: Secondary | ICD-10-CM

## 2012-11-07 DIAGNOSIS — R159 Full incontinence of feces: Secondary | ICD-10-CM

## 2012-11-07 DIAGNOSIS — K611 Rectal abscess: Secondary | ICD-10-CM

## 2012-11-07 LAB — COMPLETE METABOLIC PANEL WITH GFR
ALT: 14 U/L (ref 0–53)
CO2: 26 mEq/L (ref 19–32)
Calcium: 9.4 mg/dL (ref 8.4–10.5)
Chloride: 102 mEq/L (ref 96–112)
GFR, Est African American: 78 mL/min
Sodium: 136 mEq/L (ref 135–145)
Total Bilirubin: 0.3 mg/dL (ref 0.3–1.2)
Total Protein: 7.3 g/dL (ref 6.0–8.3)

## 2012-11-07 LAB — CBC WITH DIFFERENTIAL/PLATELET
Lymphocytes Relative: 30 % (ref 12–46)
Lymphs Abs: 2.3 10*3/uL (ref 0.7–4.0)
Neutrophils Relative %: 64 % (ref 43–77)
Platelets: 155 10*3/uL (ref 150–400)
RBC: 4.72 MIL/uL (ref 4.22–5.81)
WBC: 7.6 10*3/uL (ref 4.0–10.5)

## 2012-11-07 LAB — RPR

## 2012-11-07 NOTE — Progress Notes (Signed)
    [facility]    Subjective:   Shawn Brooks is a 37 year old AA man with HIV well controlled on prezista norvir and truvada who also has had recent problems with perirectal abscess sp I and D at [place], fu at [facility] where they are planning on colonoscopy to rule out Crohns disease.   He presents acutely tot [facility] with c/o fecal incontinence at times and also of bloody bowel movements a few times over the past week. He lacks dizziness, lightheadeness or fatigue.   On exam he does NOT have any visible blood. He does have some tenderness and induration concerning for possible return of perirectal abscess.      Blood pressure 114/73, pulse 79, temperature 98.7 F (37.1 C), temperature source Oral, height 5' 8.75" (1.746 m), weight 217 lb 8 oz (98.657 kg).   Physical Exam: General: Alert and awake, oriented x3, not in any acute distress. HEENT: anicteric sclera, pupils reactive to light and accommodation, EOMI CVS regular rate, normal r,  no murmur rubs or gallops Chest: clear to auscultation bilaterally, no wheezing, rales or rhonchi Abdomen: soft nontender, nondistended, normal bowel sounds, Extremities: no  clubbing or edema noted bilaterally Rectum: no blood, there is area on left buttocks that is indurated and tender to palpation,  Skin: no rashes Lymph: no new lymphadenopathy Neuro: nonfocal    Assessment/Plan:  HIV: recheck labs today, continue current regimenf u with Dr. Luciana Axe in June if not sooner if needed based on progress  Blood per rectum and hx of recent fecal incontinence in settin of new tenderness on buttocks concerning for recurrent perirectal abscess: Doubt sig GI bleed.  willl get stat blood work and ask him to be seen as soon as possible by his MD in [facility]  Blood per rectum : could be due to known hemorrhoids, will get stat blood work    [employer] 11/07/2012, 2:48 PM

## 2012-11-07 NOTE — Addendum Note (Signed)
Addended by: Mariea Clonts D on: 11/07/2012 04:37 PM   Modules accepted: Orders

## 2012-11-07 NOTE — Addendum Note (Signed)
Addended by: Jennet Maduro D on: 11/07/2012 04:21 PM   Modules accepted: Orders

## 2012-11-07 NOTE — Patient Instructions (Addendum)
I want you to be seen by the Surgeons at [facility] as soon as possible  We will get blood work today and you should keep your upcoming appt with Dr. Luciana Axe

## 2012-11-09 LAB — HIV-1 RNA QUANT-NO REFLEX-BLD: HIV 1 RNA Quant: 102 copies/mL — ABNORMAL HIGH (ref <20)

## 2012-11-13 ENCOUNTER — Other Ambulatory Visit: Payer: No Typology Code available for payment source

## 2012-11-17 DIAGNOSIS — D013 Carcinoma in situ of anus and anal canal: Secondary | ICD-10-CM | POA: Insufficient documentation

## 2012-11-17 DIAGNOSIS — A630 Anogenital (venereal) warts: Secondary | ICD-10-CM | POA: Insufficient documentation

## 2012-11-28 ENCOUNTER — Ambulatory Visit (INDEPENDENT_AMBULATORY_CARE_PROVIDER_SITE_OTHER): Payer: No Typology Code available for payment source | Admitting: Internal Medicine

## 2012-11-28 ENCOUNTER — Encounter: Payer: Self-pay | Admitting: Internal Medicine

## 2012-11-28 VITALS — BP 114/75 | HR 71 | Temp 97.6°F | Wt 213.0 lb

## 2012-11-28 DIAGNOSIS — G8929 Other chronic pain: Secondary | ICD-10-CM

## 2012-11-28 DIAGNOSIS — B20 Human immunodeficiency virus [HIV] disease: Secondary | ICD-10-CM

## 2012-11-28 DIAGNOSIS — M545 Low back pain, unspecified: Secondary | ICD-10-CM

## 2012-11-28 DIAGNOSIS — K625 Hemorrhage of anus and rectum: Secondary | ICD-10-CM

## 2012-11-28 NOTE — Assessment & Plan Note (Signed)
He continues to have problems with his hemorrhoids and is being managed by surgery at [facility]. He also has a colonoscopy scheduled.

## 2012-11-28 NOTE — Assessment & Plan Note (Signed)
He has some continued back pain and is going to call sports medicine again to see about further injections the

## 2012-11-28 NOTE — Assessment & Plan Note (Signed)
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 I will continue him with the current regimen and at this time we'll not consider intensification since he has had difficulty with compliance in the past. He will return to clinic in 3-4 months.

## 2012-11-28 NOTE — Progress Notes (Signed)
  Subjective:    Patient ID: Shawn Brooks, male    DOB: [birth year], 37 y.o.   MRN: [id number]  HPI He comes in for followup of his HIV. He is on Prezista, Norvir and Truvada and denies any missed doses.  He is going to [name] for surgical and gastroenterology care for his anal warts, chronic diarrhea and hemorrhoids. He is scheduled for a colonoscopy and then for wart removal. He did have a pararectal abscess recently. No weight loss, no rashes.   Review of Systems  Constitutional: Negative for fatigue and unexpected weight change.  HENT: Negative for sore throat and trouble swallowing.   Respiratory: Negative for cough and shortness of breath.   Gastrointestinal: Positive for diarrhea and anal bleeding. Negative for nausea and abdominal pain.  Genitourinary: Negative for discharge and genital sores.  Musculoskeletal: Positive for back pain.  Neurological: Negative for dizziness and headaches.       Objective:   Physical Exam  Constitutional: He appears well-developed and well-nourished. No distress.  HENT:  Mouth/Throat: Oropharynx is clear and moist. No oropharyngeal exudate.  Cardiovascular: Normal rate, regular rhythm and normal heart sounds.  Exam reveals no gallop and no friction rub.   No murmur heard. Pulmonary/Chest: Effort normal and breath sounds normal. No respiratory distress. He has no wheezes. He has no rales.  Lymphadenopathy:    He has no cervical adenopathy.          Assessment & Plan:

## 2012-12-01 ENCOUNTER — Other Ambulatory Visit: Payer: Self-pay | Admitting: *Deleted

## 2012-12-01 DIAGNOSIS — B20 Human immunodeficiency virus [HIV] disease: Secondary | ICD-10-CM

## 2012-12-01 MED ORDER — RITONAVIR 100 MG PO TABS: 100.0000 mg | ORAL_TABLET | Freq: Every day | ORAL | Status: DC

## 2012-12-01 MED ORDER — EMTRICITABINE-TENOFOVIR DF 200-300 MG PO TABS: 1.0000 | ORAL_TABLET | Freq: Every day | ORAL | Status: DC

## 2012-12-01 MED ORDER — DARUNAVIR ETHANOLATE 800 MG PO TABS: 800.0000 mg | ORAL_TABLET | Freq: Every day | ORAL | Status: DC

## 2012-12-04 ENCOUNTER — Other Ambulatory Visit: Payer: Self-pay | Admitting: Licensed Clinical Social Worker

## 2012-12-04 DIAGNOSIS — F329 Major depressive disorder, single episode, unspecified: Secondary | ICD-10-CM

## 2012-12-04 MED ORDER — ESCITALOPRAM OXALATE 20 MG PO TABS: 20.0000 mg | ORAL_TABLET | Freq: Every day | ORAL | Status: DC

## 2013-01-05 ENCOUNTER — Other Ambulatory Visit: Payer: Self-pay | Admitting: Licensed Clinical Social Worker

## 2013-01-05 DIAGNOSIS — B029 Zoster without complications: Secondary | ICD-10-CM

## 2013-01-05 MED ORDER — VALACYCLOVIR HCL 500 MG PO TABS: 500.0000 mg | ORAL_TABLET | Freq: Two times a day (BID) | ORAL | Status: DC | PRN

## 2013-01-15 DIAGNOSIS — M5416 Radiculopathy, lumbar region: Secondary | ICD-10-CM | POA: Insufficient documentation

## 2013-01-15 DIAGNOSIS — IMO0002 Reserved for concepts with insufficient information to code with codable children: Secondary | ICD-10-CM | POA: Insufficient documentation

## 2013-01-15 DIAGNOSIS — Z8719 Personal history of other diseases of the digestive system: Secondary | ICD-10-CM | POA: Insufficient documentation

## 2013-01-25 ENCOUNTER — Ambulatory Visit: Payer: Self-pay

## 2013-01-26 ENCOUNTER — Other Ambulatory Visit: Payer: Self-pay | Admitting: *Deleted

## 2013-01-26 DIAGNOSIS — B20 Human immunodeficiency virus [HIV] disease: Secondary | ICD-10-CM

## 2013-01-26 MED ORDER — RITONAVIR 100 MG PO TABS: 100.0000 mg | ORAL_TABLET | Freq: Every day | ORAL | Status: DC

## 2013-01-26 MED ORDER — DARUNAVIR ETHANOLATE 800 MG PO TABS: 800.0000 mg | ORAL_TABLET | Freq: Every day | ORAL | Status: DC

## 2013-01-26 MED ORDER — EMTRICITABINE-TENOFOVIR DF 200-300 MG PO TABS: 1.0000 | ORAL_TABLET | Freq: Every day | ORAL | Status: DC

## 2013-02-01 ENCOUNTER — Ambulatory Visit: Payer: Self-pay

## 2013-02-24 ENCOUNTER — Other Ambulatory Visit: Payer: Self-pay | Admitting: Internal Medicine

## 2013-03-19 DIAGNOSIS — B20 Human immunodeficiency virus [HIV] disease: Secondary | ICD-10-CM

## 2013-03-20 ENCOUNTER — Other Ambulatory Visit (INDEPENDENT_AMBULATORY_CARE_PROVIDER_SITE_OTHER): Payer: Self-pay

## 2013-03-20 DIAGNOSIS — B20 Human immunodeficiency virus [HIV] disease: Secondary | ICD-10-CM

## 2013-03-20 LAB — COMPLETE METABOLIC PANEL WITH GFR
Albumin: 3.9 g/dL (ref 3.5–5.2)
BUN: 11 mg/dL (ref 6–23)
CO2: 29 mEq/L (ref 19–32)
Calcium: 9.4 mg/dL (ref 8.4–10.5)
Chloride: 103 mEq/L (ref 96–112)
GFR, Est African American: 73 mL/min
GFR, Est Non African American: 63 mL/min
Glucose, Bld: 90 mg/dL (ref 70–99)
Potassium: 3.9 mEq/L (ref 3.5–5.3)
Total Protein: 7.2 g/dL (ref 6.0–8.3)

## 2013-03-20 LAB — CBC WITH DIFFERENTIAL/PLATELET
Basophils Relative: 0 % (ref 0–1)
HCT: 43.6 % (ref 39.0–52.0)
Hemoglobin: 15.3 g/dL (ref 13.0–17.0)
Lymphocytes Relative: 41 % (ref 12–46)
Lymphs Abs: 2.7 10*3/uL (ref 0.7–4.0)
MCHC: 35.1 g/dL (ref 30.0–36.0)
Monocytes Absolute: 0.5 10*3/uL (ref 0.1–1.0)
Monocytes Relative: 8 % (ref 3–12)
Neutro Abs: 3.2 10*3/uL (ref 1.7–7.7)
RBC: 4.84 MIL/uL (ref 4.22–5.81)

## 2013-03-21 LAB — HIV-1 RNA QUANT-NO REFLEX-BLD: HIV 1 RNA Quant: 149 copies/mL — ABNORMAL HIGH (ref <20)

## 2013-03-22 ENCOUNTER — Other Ambulatory Visit: Payer: Self-pay

## 2013-03-29 ENCOUNTER — Other Ambulatory Visit: Payer: Self-pay | Admitting: Internal Medicine

## 2013-04-05 ENCOUNTER — Ambulatory Visit (INDEPENDENT_AMBULATORY_CARE_PROVIDER_SITE_OTHER): Payer: No Typology Code available for payment source | Admitting: Internal Medicine

## 2013-04-05 ENCOUNTER — Encounter: Payer: Self-pay | Admitting: Internal Medicine

## 2013-04-05 VITALS — BP 120/78 | HR 75 | Temp 98.1°F | Ht 70.0 in | Wt 210.0 lb

## 2013-04-05 DIAGNOSIS — B20 Human immunodeficiency virus [HIV] disease: Secondary | ICD-10-CM

## 2013-04-05 DIAGNOSIS — Z23 Encounter for immunization: Secondary | ICD-10-CM

## 2013-04-05 MED ORDER — DOLUTEGRAVIR SODIUM 50 MG PO TABS: 50.0000 mg | ORAL_TABLET | Freq: Every day | ORAL | Status: DC

## 2013-04-05 NOTE — Progress Notes (Signed)
  Subjective:    Patient ID: Shawn Brooks, male    DOB: [birth year], 37 y.o.   MRN: [id number]  HPI  He comes in for followup of his HIV. He is on Prezista, Norvir and Truvada and denies any missed doses.  He has gone to [name] for surgical and gastroenterology care for his anal warts, chronic diarrhea and hemorrhoids. 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 No weight loss, no diarrhea.  Back pain being managed by sports medicine.  Unfortunately, viral load remains detectable and never been undetectable.  Not high enough for a genotype.  Denies more than one or two missed doses a month.     Review of Systems  Constitutional: Negative for fatigue and unexpected weight change.  HENT: Negative for sore throat and trouble swallowing.   Respiratory: Negative for cough and shortness of breath.   Gastrointestinal: Positive for diarrhea and anal bleeding. Negative for nausea and abdominal pain.  Genitourinary: Negative for discharge and genital sores.  Musculoskeletal: Positive for back pain.  Neurological: Negative for dizziness and headaches.       Objective:   Physical Exam  Constitutional: He appears well-developed and well-nourished. No distress.  HENT:  Mouth/Throat: Oropharynx is clear and moist. No oropharyngeal exudate.  Cardiovascular: Normal rate, regular rhythm and normal heart sounds.  Exam reveals no gallop and no friction rub.   No murmur heard. Pulmonary/Chest: Effort normal and breath sounds normal. No respiratory distress. He has no wheezes. He has no rales.  Lymphadenopathy:    He has no cervical adenopathy.          Assessment & Plan:

## 2013-04-05 NOTE — Assessment & Plan Note (Signed)
Discussed my concerns of detectable virus despite complaince.  I will intensify with [place].  Labs in one month and rtc 1 weeks later.

## 2013-04-13 ENCOUNTER — Other Ambulatory Visit: Payer: Self-pay | Admitting: *Deleted

## 2013-04-13 DIAGNOSIS — M549 Dorsalgia, unspecified: Secondary | ICD-10-CM

## 2013-04-13 MED ORDER — NAPROXEN 500 MG PO TABS: 500.0000 mg | ORAL_TABLET | Freq: Two times a day (BID) | ORAL | Status: DC

## 2013-04-13 NOTE — Telephone Encounter (Signed)
Pt shared that he is continuing to take Napoxen 500 mg for back pain.  Pt had previously been referred to [facility] for primary care to f/u on continuing back pain problem.  RN let the patient know that [facility] had moved to [address] and takes walk-in patients.  Pt verbalized back his understanding.

## 2013-04-24 ENCOUNTER — Other Ambulatory Visit: Payer: Self-pay | Admitting: *Deleted

## 2013-04-24 ENCOUNTER — Telehealth: Payer: Self-pay | Admitting: *Deleted

## 2013-04-24 DIAGNOSIS — K219 Gastro-esophageal reflux disease without esophagitis: Secondary | ICD-10-CM

## 2013-04-24 MED ORDER — OMEPRAZOLE 20 MG PO CPDR: DELAYED_RELEASE_CAPSULE | ORAL | Status: DC

## 2013-04-24 NOTE — Telephone Encounter (Signed)
Patient called requesting refill of cialis; this has not been on his medication list for 1 year.   Please advise if it is appropriate. Andree Coss, RN

## 2013-04-25 ENCOUNTER — Other Ambulatory Visit: Payer: Self-pay | Admitting: *Deleted

## 2013-04-25 DIAGNOSIS — N529 Male erectile dysfunction, unspecified: Secondary | ICD-10-CM

## 2013-04-25 MED ORDER — TADALAFIL 10 MG PO TABS: 5.0000 mg | ORAL_TABLET | Freq: Every day | ORAL | Status: DC | PRN

## 2013-04-25 NOTE — Telephone Encounter (Signed)
Ok by me - thanks 

## 2013-05-03 ENCOUNTER — Other Ambulatory Visit (INDEPENDENT_AMBULATORY_CARE_PROVIDER_SITE_OTHER): Payer: Self-pay

## 2013-05-03 DIAGNOSIS — B20 Human immunodeficiency virus [HIV] disease: Secondary | ICD-10-CM

## 2013-05-03 LAB — COMPLETE METABOLIC PANEL WITH GFR
Albumin: 3.9 g/dL (ref 3.5–5.2)
BUN: 11 mg/dL (ref 6–23)
Calcium: 9.1 mg/dL (ref 8.4–10.5)
Chloride: 106 mEq/L (ref 96–112)
GFR, Est Non African American: 61 mL/min
Glucose, Bld: 87 mg/dL (ref 70–99)
Potassium: 4.3 mEq/L (ref 3.5–5.3)
Sodium: 138 mEq/L (ref 135–145)
Total Protein: 6.7 g/dL (ref 6.0–8.3)

## 2013-05-03 LAB — CBC WITH DIFFERENTIAL/PLATELET
Basophils Absolute: 0 10*3/uL (ref 0.0–0.1)
Basophils Relative: 0 % (ref 0–1)
Eosinophils Relative: 2 % (ref 0–5)
HCT: 45 % (ref 39.0–52.0)
Lymphocytes Relative: 43 % (ref 12–46)
MCHC: 34.4 g/dL (ref 30.0–36.0)
MCV: 90.7 fL (ref 78.0–100.0)
Monocytes Absolute: 0.5 10*3/uL (ref 0.1–1.0)
Neutro Abs: 3.5 10*3/uL (ref 1.7–7.7)
Platelets: 180 10*3/uL (ref 150–400)
RDW: 14 % (ref 11.5–15.5)
WBC: 7.2 10*3/uL (ref 4.0–10.5)

## 2013-05-10 ENCOUNTER — Ambulatory Visit (INDEPENDENT_AMBULATORY_CARE_PROVIDER_SITE_OTHER): Payer: Self-pay | Admitting: Internal Medicine

## 2013-05-10 ENCOUNTER — Encounter: Payer: Self-pay | Admitting: Internal Medicine

## 2013-05-10 VITALS — BP 117/76 | HR 85 | Temp 98.4°F | Ht 68.0 in | Wt 217.0 lb

## 2013-05-10 DIAGNOSIS — B20 Human immunodeficiency virus [HIV] disease: Secondary | ICD-10-CM

## 2013-05-10 DIAGNOSIS — N529 Male erectile dysfunction, unspecified: Secondary | ICD-10-CM

## 2013-05-10 NOTE — Assessment & Plan Note (Signed)
Partially it does appear that intensification did not add any benefit. I will though continue with the additional therapy. We will repeat in 3 months. I suspect there is some underlying noncompliance however he denies any missed doses. No other drug interactions have been able to determine.

## 2013-05-10 NOTE — Assessment & Plan Note (Signed)
He is unable to afford Cialis and is going to bring in the form for Viagra assistance.

## 2013-05-10 NOTE — Progress Notes (Signed)
  Subjective:    Patient ID: Shawn Brooks, male    DOB: [birth year], 37 y.o.   MRN: [id number]  HPI  He comes in for followup of his HIV. He is on Prezista, Norvir and Truvada and denies any missed doses.  He has gone to [name] for surgical and gastroenterology care for his anal warts, chronic diarrhea and hemorrhoids. 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 No weight loss, no diarrhea.  Back pain being managed by sports medicine.  Unfortunately, viral load remains detectable and never been undetectable.  Not high enough for a genotype.  Denies more than one or two missed doses a month.  Last visit did add tivicay to his regimen. Unfortunately there is no significant change in his viral load suppression. He also complains of erectile dysfunction.   Review of Systems  Constitutional: Negative for fatigue and unexpected weight change.  HENT: Negative for sore throat and trouble swallowing.   Eyes: Negative for visual disturbance.  Respiratory: Negative for cough and shortness of breath.   Cardiovascular: Negative for chest pain.  Gastrointestinal: Negative for nausea, abdominal pain, diarrhea and anal bleeding.  Genitourinary: Negative for discharge and genital sores.  Musculoskeletal: Positive for back pain.  Neurological: Negative for dizziness and headaches.  Hematological: Negative for adenopathy.  Psychiatric/Behavioral: Negative for dysphoric mood.       Objective:   Physical Exam  Constitutional: He appears well-developed and well-nourished. No distress.  HENT:  Mouth/Throat: Oropharynx is clear and moist. No oropharyngeal exudate.  Cardiovascular: Normal rate, regular rhythm and normal heart sounds.  Exam reveals no gallop and no friction rub.   No murmur heard. Pulmonary/Chest: Effort normal and breath sounds normal. No respiratory distress. He has no wheezes. He has no rales.  Lymphadenopathy:    He has no cervical adenopathy.  Neurological: He  is alert.  Skin: Skin is warm and dry. No rash noted.  Psychiatric: He has a normal mood and affect.          Assessment & Plan:

## 2013-05-21 ENCOUNTER — Other Ambulatory Visit: Payer: Self-pay | Admitting: Licensed Clinical Social Worker

## 2013-05-21 DIAGNOSIS — M549 Dorsalgia, unspecified: Secondary | ICD-10-CM

## 2013-05-23 ENCOUNTER — Ambulatory Visit: Payer: Self-pay

## 2013-05-23 ENCOUNTER — Telehealth: Payer: Self-pay | Admitting: Internal Medicine

## 2013-05-23 ENCOUNTER — Telehealth: Payer: Self-pay | Admitting: Emergency Medicine

## 2013-05-23 NOTE — Telephone Encounter (Signed)
Spoke with pt regarding pain med. Pt has not been seen here for office visit. Informed him we will evaluate pain needs at next scheduled visit 06/07/13

## 2013-05-23 NOTE — Telephone Encounter (Signed)
Pt had an appt today to manage medications; pt is in a lot of pain and was looking for some medications that are not scripted here; i informed the pt of our narcotics policy and he would like to see if tramadol is an option instead; please call pt to coordinate

## 2013-06-07 ENCOUNTER — Ambulatory Visit: Payer: Self-pay

## 2013-08-09 ENCOUNTER — Ambulatory Visit: Payer: Self-pay

## 2013-08-09 ENCOUNTER — Other Ambulatory Visit: Payer: Self-pay

## 2013-08-13 ENCOUNTER — Ambulatory Visit: Payer: Self-pay

## 2013-08-13 ENCOUNTER — Other Ambulatory Visit (INDEPENDENT_AMBULATORY_CARE_PROVIDER_SITE_OTHER): Payer: Self-pay

## 2013-08-13 DIAGNOSIS — B20 Human immunodeficiency virus [HIV] disease: Secondary | ICD-10-CM

## 2013-08-14 LAB — T-HELPER CELL (CD4) - (RCID CLINIC ONLY)
CD4 T CELL ABS: 910 /uL (ref 400–2700)
CD4 T CELL HELPER: 32 % — AB (ref 33–55)

## 2013-08-15 ENCOUNTER — Other Ambulatory Visit: Payer: Self-pay | Admitting: *Deleted

## 2013-08-15 DIAGNOSIS — B20 Human immunodeficiency virus [HIV] disease: Secondary | ICD-10-CM

## 2013-08-15 LAB — HIV-1 RNA QUANT-NO REFLEX-BLD
HIV 1 RNA Quant: 92 copies/mL — ABNORMAL HIGH (ref <20)
HIV-1 RNA QUANT, LOG: 1.96 {Log} — AB (ref <1.30)

## 2013-08-15 MED ORDER — EMTRICITABINE-TENOFOVIR DF 200-300 MG PO TABS: 1.0000 | ORAL_TABLET | Freq: Every day | ORAL | Status: DC

## 2013-08-15 MED ORDER — RITONAVIR 100 MG PO TABS: 100.0000 mg | ORAL_TABLET | Freq: Every day | ORAL | Status: DC

## 2013-08-15 MED ORDER — DOLUTEGRAVIR SODIUM 50 MG PO TABS: 50.0000 mg | ORAL_TABLET | Freq: Every day | ORAL | Status: DC

## 2013-08-15 MED ORDER — DARUNAVIR ETHANOLATE 800 MG PO TABS: 800.0000 mg | ORAL_TABLET | Freq: Every day | ORAL | Status: DC

## 2013-08-29 ENCOUNTER — Other Ambulatory Visit (HOSPITAL_COMMUNITY): Payer: Self-pay

## 2013-08-29 ENCOUNTER — Emergency Department (HOSPITAL_COMMUNITY): Payer: Self-pay

## 2013-08-29 ENCOUNTER — Emergency Department (HOSPITAL_COMMUNITY)
Admission: EM | Admit: 2013-08-29 | Discharge: 2013-08-30 | Disposition: A | Discharge status: Home / Self Care | Payer: Self-pay | Attending: Emergency Medicine | Admitting: Emergency Medicine

## 2013-08-29 DIAGNOSIS — S0993XA Unspecified injury of face, initial encounter: Secondary | ICD-10-CM | POA: Insufficient documentation

## 2013-08-29 DIAGNOSIS — W503XXA Accidental bite by another person, initial encounter: Secondary | ICD-10-CM | POA: Insufficient documentation

## 2013-08-29 DIAGNOSIS — Z8739 Personal history of other diseases of the musculoskeletal system and connective tissue: Secondary | ICD-10-CM | POA: Insufficient documentation

## 2013-08-29 DIAGNOSIS — R55 Syncope and collapse: Secondary | ICD-10-CM | POA: Insufficient documentation

## 2013-08-29 DIAGNOSIS — E785 Hyperlipidemia, unspecified: Secondary | ICD-10-CM | POA: Insufficient documentation

## 2013-08-29 DIAGNOSIS — W07XXXA Fall from chair, initial encounter: Secondary | ICD-10-CM | POA: Insufficient documentation

## 2013-08-29 DIAGNOSIS — R05 Cough: Secondary | ICD-10-CM | POA: Insufficient documentation

## 2013-08-29 DIAGNOSIS — Z79899 Other long term (current) drug therapy: Secondary | ICD-10-CM | POA: Insufficient documentation

## 2013-08-29 DIAGNOSIS — R059 Cough, unspecified: Secondary | ICD-10-CM | POA: Insufficient documentation

## 2013-08-29 DIAGNOSIS — F17200 Nicotine dependence, unspecified, uncomplicated: Secondary | ICD-10-CM | POA: Insufficient documentation

## 2013-08-29 DIAGNOSIS — S0001XA Abrasion of scalp, initial encounter: Secondary | ICD-10-CM

## 2013-08-29 DIAGNOSIS — F3289 Other specified depressive episodes: Secondary | ICD-10-CM | POA: Insufficient documentation

## 2013-08-29 DIAGNOSIS — F411 Generalized anxiety disorder: Secondary | ICD-10-CM | POA: Insufficient documentation

## 2013-08-29 DIAGNOSIS — Y929 Unspecified place or not applicable: Secondary | ICD-10-CM | POA: Insufficient documentation

## 2013-08-29 DIAGNOSIS — Z21 Asymptomatic human immunodeficiency virus [HIV] infection status: Secondary | ICD-10-CM | POA: Insufficient documentation

## 2013-08-29 DIAGNOSIS — Y9389 Activity, other specified: Secondary | ICD-10-CM | POA: Insufficient documentation

## 2013-08-29 DIAGNOSIS — IMO0002 Reserved for concepts with insufficient information to code with codable children: Secondary | ICD-10-CM | POA: Insufficient documentation

## 2013-08-29 DIAGNOSIS — H53149 Visual discomfort, unspecified: Secondary | ICD-10-CM | POA: Insufficient documentation

## 2013-08-29 DIAGNOSIS — G8929 Other chronic pain: Secondary | ICD-10-CM | POA: Insufficient documentation

## 2013-08-29 DIAGNOSIS — F329 Major depressive disorder, single episode, unspecified: Secondary | ICD-10-CM | POA: Insufficient documentation

## 2013-08-29 DIAGNOSIS — Z862 Personal history of diseases of the blood and blood-forming organs and certain disorders involving the immune mechanism: Secondary | ICD-10-CM | POA: Insufficient documentation

## 2013-08-29 DIAGNOSIS — R569 Unspecified convulsions: Secondary | ICD-10-CM | POA: Insufficient documentation

## 2013-08-29 DIAGNOSIS — S199XXA Unspecified injury of neck, initial encounter: Secondary | ICD-10-CM

## 2013-08-29 LAB — BASIC METABOLIC PANEL
BUN: 10 mg/dL (ref 6–23)
CALCIUM: 9.4 mg/dL (ref 8.4–10.5)
CO2: 23 mEq/L (ref 19–32)
CREATININE: 1.33 mg/dL (ref 0.50–1.35)
Chloride: 101 mEq/L (ref 96–112)
GFR, EST AFRICAN AMERICAN: 76 mL/min — AB (ref >90)
GFR, EST NON AFRICAN AMERICAN: 66 mL/min — AB (ref >90)
Glucose, Bld: 90 mg/dL (ref 70–99)
Potassium: 4.3 mEq/L (ref 3.7–5.3)
Sodium: 137 mEq/L (ref 137–147)

## 2013-08-29 LAB — CBC
HEMATOCRIT: 43.8 % (ref 39.0–52.0)
Hemoglobin: 14.9 g/dL (ref 13.0–17.0)
MCH: 31 pg (ref 26.0–34.0)
MCHC: 34 g/dL (ref 30.0–36.0)
MCV: 91.3 fL (ref 78.0–100.0)
Platelets: 171 10*3/uL (ref 150–400)
RBC: 4.8 MIL/uL (ref 4.22–5.81)
RDW: 13.8 % (ref 11.5–15.5)
WBC: 8.4 10*3/uL (ref 4.0–10.5)

## 2013-08-29 LAB — CBG MONITORING, ED: Glucose-Capillary: 96 mg/dL (ref 70–99)

## 2013-08-29 MED ORDER — SODIUM CHLORIDE 0.9 % IV SOLN
Freq: Once | INTRAVENOUS | Status: AC
  Administered 2013-08-29: 23:00:00 via INTRAVENOUS

## 2013-08-29 MED ORDER — HYDROMORPHONE HCL PF 1 MG/ML IJ SOLN
1.0000 mg | Freq: Once | INTRAMUSCULAR | Status: AC
  Administered 2013-08-29: 1 mg via INTRAVENOUS
  Filled 2013-08-29: qty 1

## 2013-08-29 NOTE — ED Notes (Signed)
Pt states that he was in a computer chair and leaned over as he was coughing and passed out. Friend states that his eyes were rolling back in his head and he was shaking as if seizure like activity. Pt bit tongue, headache, neck and back pain and abrasion on head.

## 2013-08-29 NOTE — ED Notes (Signed)
Initial Contact - pt to RM 20 with visitor, reports "coughing attack", pt remembers leaning forward out of chair and falling onto ground "then woke up with him standing over me".  Per visitor, pt with apparent "seizure shaking of his body", lasting approx 15 seconds.  Pt denies hx of sz or incontinence.  Pt reports waking up and c/o 7/10 pain to head and upper back, otherwise denies complaints.  Abrasion noted to forehead with bleeding controlled. Neuros grossly intact, speaking full/clear sentences, ambulating with steady gait, skin PWD.  NAD.  Awaiting EDP eval.

## 2013-08-29 NOTE — ED Provider Notes (Signed)
CSN: [id number]     Arrival date & time 08/29/13  1941 History  This chart was scribed for non-physician practitioner, ,working with Richarda Blade, MD, by Marlowe Kays, ED Scribe.  This patient was seen in room WA20/WA20 and the patient's care was started at 10:32 PM.  Chief Complaint  Patient presents with  . Fall     (Consider location/radiation/quality/duration/timing/severity/associated sxs/prior Treatment) HPI HPI Comments:  Shawn Brooks is a 38 y.o. male who presents to the Emergency Department complaining of a syncopal episode secondary to coughing approximately 3.5 hours ago. Friend at bedside reports patient had what appeared to be a seizure that lasted less than one minute. No seizure history. The patient reports recurrent headaches for the past one month, but denies fever, nausea, vomiting or recent illness. He fell forward out of his chair, falling to the floor causing an abrasion to top of head, mild bilateral neck pain and increased chronic back pain. Pt denies nausea, vomiting, fever, dizziness, light-headedness, CP, or SOB. He is not currently prescribed any anticoagulants. He reports a history of HIV and also that his last viral load was "low". He is unsure of last tetanus vaccination. He denies any allergies to any medications. Pt has been ambulatory since the episode.  Past Medical History  Diagnosis Date  . HIV infection   . Hyperlipidemia   . Blood dyscrasia     HIV  . Chronic back pain   . Depression   . Anxiety   . Headache(784.0)   . Vertigo   . DJD (degenerative joint disease)    Past Surgical History  Procedure Laterality Date  . Wisdom tooth extraction    . Esophagogastroduodenoscopy  03/27/2012    Procedure: ESOPHAGOGASTRODUODENOSCOPY (EGD);  Surgeon: Lear Ng, MD;  Location: [facility];  Service: Endoscopy;  Laterality: N/A;   Family History  Problem Relation Age of Onset  . Cancer Mother     laryngeal  . Pneumonia Father   .  Diabetes Father   . Hypertension Sister   . Crohn's disease Brother   . Crohn's disease Maternal Grandmother   . Cancer Maternal Grandmother   . Cancer Maternal Grandfather    History  Substance Use Topics  . Smoking status: Current Every Day Smoker -- 0.50 packs/day    Types: Cigarettes  . Smokeless tobacco: Never Used  . Alcohol Use: 1.0 oz/week    2 drink(s) per week     Comment: beer at times. not often    Review of Systems  Constitutional: Negative for fever.  HENT: Negative for sore throat and trouble swallowing.        Complains of sore to tongue caused by biting during seizure.  Eyes: Positive for photophobia.  Respiratory: Positive for cough. Negative for shortness of breath.        He had a cough that he feels started event, but denies recent or ongoing cough, and no possibility of aspiration.  Cardiovascular: Negative for chest pain.  Gastrointestinal: Negative for nausea, vomiting and abdominal pain.  Genitourinary: Negative for dysuria.       No urinary incontinence.  Musculoskeletal: Positive for back pain and neck pain.  Skin: Positive for wound.  Neurological: Positive for seizures, syncope and headaches. Negative for dizziness, weakness and light-headedness.  Hematological: Does not bruise/bleed easily.  Psychiatric/Behavioral: Negative for confusion.   Allergies  Review of patient's allergies indicates no known allergies.  Home Medications   Current Outpatient Rx  Name  Route  Sig  Dispense  Refill  . Darunavir Ethanolate (PREZISTA) 800 MG tablet   Oral   Take 1 tablet (800 mg total) by mouth daily.   30 tablet   11   . dolutegravir (TIVICAY) 50 MG tablet   Oral   Take 1 tablet (50 mg total) by mouth daily.   30 tablet   11   . doxylamine, Sleep, (SLEEP AID) 25 MG tablet   Oral   Take 25 mg by mouth at bedtime as needed for sleep.         Marland Kitchen emtricitabine-tenofovir (TRUVADA) 200-300 MG per tablet   Oral   Take 1 tablet by mouth daily.    30 tablet   11   . escitalopram (LEXAPRO) 20 MG tablet   Oral   Take 1 tablet (20 mg total) by mouth daily.   30 tablet   6   . omeprazole (PRILOSEC) 20 MG capsule   Oral   Take 1 mg by mouth at bedtime.         . ritonavir (NORVIR) 100 MG TABS tablet   Oral   Take 1 tablet (100 mg total) by mouth daily.   30 tablet   11   . traZODone (DESYREL) 50 MG tablet   Oral   Take 50 mg by mouth at bedtime. Mental health          Triage Vitals: BP 127/69  Pulse 65  Temp(Src) 98 F (36.7 C) (Oral)  Resp 18  SpO2 99% Physical Exam  Nursing note and vitals reviewed. Constitutional: He is oriented to person, place, and time. He appears well-developed and well-nourished. No distress.  HENT:  Head: Normocephalic.  Right Ear: Hearing, tympanic membrane, external ear and ear canal normal. No hemotympanum.  Left Ear: Hearing, tympanic membrane, external ear and ear canal normal. No hemotympanum.  Eyes: Conjunctivae and EOM are normal. Pupils are equal, round, and reactive to light.  Neck: Normal range of motion.  Cardiovascular: Normal rate, regular rhythm and normal heart sounds.  Exam reveals no gallop and no friction rub.   No murmur heard. Pulmonary/Chest: Effort normal and breath sounds normal. No respiratory distress. He has no wheezes. He has no rales. He exhibits no tenderness.  Abdominal: Soft. There is no tenderness.  Musculoskeletal: Normal range of motion. He exhibits no edema and no tenderness.  No midline cervical tenderness, but there is bilateral paracervical tenderness.   Neurological: He is alert and oriented to person, place, and time. He has normal reflexes. He displays normal reflexes. Coordination normal.  Equal strength of BUE and BLE. No strength deficits. Cranial nerves 3-12 grossly intact.   Skin: Skin is warm and dry.  Superficial abrasion to frontal scalp.  Psychiatric: He has a normal mood and affect. His behavior is normal.    ED Course  Procedures  (including critical care time) DIAGNOSTIC STUDIES: Oxygen Saturation is 99% on RA, normal by my interpretation.   COORDINATION OF CARE: 10:39 PM- Will give Percocet for pain and order CT of head and neck. Will update tetanus vaccination. Pt verbalizes understanding and agrees to plan.  Labs Review Labs Reviewed  BASIC METABOLIC PANEL - Abnormal; Notable for the following:    GFR calc non Af Amer 66 (*)    GFR calc Af Amer 76 (*)    All other components within normal limits  CBC  CBG MONITORING, ED   Results for orders placed during the hospital encounter of 08/29/13  CBC      Result  Value Ref Range   WBC 8.4  4.0 - 10.5 K/uL   RBC 4.80  4.22 - 5.81 MIL/uL   Hemoglobin 14.9  13.0 - 17.0 g/dL   HCT 43.8  39.0 - 52.0 %   MCV 91.3  78.0 - 100.0 fL   MCH 31.0  26.0 - 34.0 pg   MCHC 34.0  30.0 - 36.0 g/dL   RDW 13.8  11.5 - 15.5 %   Platelets 171  150 - 400 K/uL  BASIC METABOLIC PANEL      Result Value Ref Range   Sodium 137  137 - 147 mEq/L   Potassium 4.3  3.7 - 5.3 mEq/L   Chloride 101  96 - 112 mEq/L   CO2 23  19 - 32 mEq/L   Glucose, Bld 90  70 - 99 mg/dL   BUN 10  6 - 23 mg/dL   Creatinine, Ser 1.33  0.50 - 1.35 mg/dL   Calcium 9.4  8.4 - 10.5 mg/dL   GFR calc non Af Amer 66 (*) >90 mL/min   GFR calc Af Amer 76 (*) >90 mL/min  TROPONIN I      Result Value Ref Range   Troponin I <0.30  <0.30 ng/mL  CBG MONITORING, ED      Result Value Ref Range   Glucose-Capillary 96  70 - 99 mg/dL    Ct Head W Contrast  08/30/2013   CLINICAL DATA:  Syncopal episode. Possible seizure activity. Bilateral neck pain. Recurrent headache for the past month. History of HIV.  EXAM: CT HEAD WITH CONTRAST  CT CERVICAL SPINE WITHOUT CONTRAST  TECHNIQUE: Multidetector CT imaging of the head and cervical spine was performed following the standard protocol. Multiplanar CT image reconstructions of the cervical spine were also generated.  CONTRAST:  100 cc Omnipaque 300.  COMPARISON:  Unenhanced head  CT 08/29/2013.  FINDINGS: CT HEAD FINDINGS  No intracranial enhancing lesion.  No intracranial hemorrhage.  No CT evidence of large acute infarct.  No seizure focus identified. Contrast-enhanced MR would be more sensitive for detection of such.  Mastoid air cells, middle ear cavities and visualized paranasal sinuses are clear.  Exophthalmos.  CT CERVICAL SPINE FINDINGS  No cervical spine fracture or malalignment. No abnormal prevertebral soft tissue swelling.  Minimal bulges.  IMPRESSION: CT HEAD:  No intracranial enhancing lesion.  No seizure focus identified. Contrast-enhanced MR would be more sensitive for detection of such.  CT CERVICAL SPINE :  No cervical spine fracture or malalignment. No abnormal prevertebral soft tissue swelling.   Electronically Signed   By: Chauncey Cruel M.D.   On: 08/30/2013 01:48   Ct Cervical Spine Wo Contrast  08/30/2013   CLINICAL DATA:  Syncopal episode. Possible seizure activity. Bilateral neck pain. Recurrent headache for the past month. History of HIV.  EXAM: CT HEAD WITH CONTRAST  CT CERVICAL SPINE WITHOUT CONTRAST  TECHNIQUE: Multidetector CT imaging of the head and cervical spine was performed following the standard protocol. Multiplanar CT image reconstructions of the cervical spine were also generated.  CONTRAST:  100 cc Omnipaque 300.  COMPARISON:  Unenhanced head CT 08/29/2013.  FINDINGS: CT HEAD FINDINGS  No intracranial enhancing lesion.  No intracranial hemorrhage.  No CT evidence of large acute infarct.  No seizure focus identified. Contrast-enhanced MR would be more sensitive for detection of such.  Mastoid air cells, middle ear cavities and visualized paranasal sinuses are clear.  Exophthalmos.  CT CERVICAL SPINE FINDINGS  No cervical spine fracture or malalignment.  No abnormal prevertebral soft tissue swelling.  Minimal bulges.  IMPRESSION: CT HEAD:  No intracranial enhancing lesion.  No seizure focus identified. Contrast-enhanced MR would be more sensitive for  detection of such.  CT CERVICAL SPINE :  No cervical spine fracture or malalignment. No abnormal prevertebral soft tissue swelling.   Electronically Signed   By: Chauncey Cruel M.D.   On: 08/30/2013 01:48   Imaging Review Ct Head Wo Contrast  08/29/2013   CLINICAL DATA:  Fall.  Loss of consciousness.  EXAM: CT HEAD WITHOUT CONTRAST  TECHNIQUE: Contiguous axial images were obtained from the base of the skull through the vertex without intravenous contrast.  COMPARISON:  None.  FINDINGS: No mass lesion, mass effect, midline shift, hydrocephalus, hemorrhage. No territorial ischemia or acute infarction. Calvarium intact. Paranasal sinuses are within normal limits.  IMPRESSION: Negative CT head.   Electronically Signed   By: Dereck Ligas M.D.   On: 08/29/2013 21:48     EKG Interpretation None      MDM   Final diagnoses:  None    1. Seizure 2. Abrasion, scalp  Patient with what is described as new onset seizure lasting less than one minute and tongue biting. He has a history of HIV (low viral load and healthy appearing) and has negative CT w/CM Head showing no lesions. He has been comfortable here. Pain is improved with medications. VSS. Discussed with on-call resident for close follow up in office with Dr. Linus Salmons. Patient stable for discharge.     Dewaine Oats, PA-C 08/30/13 0157

## 2013-08-30 ENCOUNTER — Encounter: Payer: Self-pay | Admitting: Internal Medicine

## 2013-08-30 ENCOUNTER — Encounter (HOSPITAL_COMMUNITY): Payer: Self-pay

## 2013-08-30 ENCOUNTER — Ambulatory Visit (INDEPENDENT_AMBULATORY_CARE_PROVIDER_SITE_OTHER): Payer: Self-pay | Admitting: Internal Medicine

## 2013-08-30 VITALS — BP 116/77 | HR 64 | Temp 98.3°F | Ht 69.0 in | Wt 225.0 lb

## 2013-08-30 DIAGNOSIS — B20 Human immunodeficiency virus [HIV] disease: Secondary | ICD-10-CM

## 2013-08-30 DIAGNOSIS — N529 Male erectile dysfunction, unspecified: Secondary | ICD-10-CM

## 2013-08-30 DIAGNOSIS — R569 Unspecified convulsions: Secondary | ICD-10-CM | POA: Insufficient documentation

## 2013-08-30 LAB — TROPONIN I: Troponin I: <0.3 ng/mL (ref <0.30)

## 2013-08-30 MED ORDER — CYCLOBENZAPRINE HCL 10 MG PO TABS: 10.0000 mg | ORAL_TABLET | Freq: Two times a day (BID) | ORAL | Status: DC | PRN

## 2013-08-30 MED ORDER — IOHEXOL 300 MG/ML  SOLN
100.0000 mL | Freq: Once | INTRAMUSCULAR | Status: AC | PRN
  Administered 2013-08-30: 100 mL via INTRAVENOUS

## 2013-08-30 MED ORDER — OXYCODONE-ACETAMINOPHEN 5-325 MG PO TABS: 1.0000 | ORAL_TABLET | ORAL | Status: DC | PRN

## 2013-08-30 MED ORDER — HYDROMORPHONE HCL PF 1 MG/ML IJ SOLN
0.5000 mg | Freq: Once | INTRAMUSCULAR | Status: AC
  Administered 2013-08-30: 0.5 mg via INTRAVENOUS
  Filled 2013-08-30: qty 1

## 2013-08-30 MED ORDER — SILDENAFIL CITRATE 50 MG PO TABS: 50.0000 mg | ORAL_TABLET | Freq: Every day | ORAL | Status: DC | PRN

## 2013-08-30 NOTE — Assessment & Plan Note (Signed)
Doing well, still not undetectable but will conitnue with tivicay.   RTC 4 months.

## 2013-08-30 NOTE — Assessment & Plan Note (Addendum)
New onset, unclear etiology. ? Marijuana.  Will see if he can get in to neurology or with PCP.

## 2013-08-30 NOTE — Assessment & Plan Note (Signed)
Will fill out pt assistance.

## 2013-08-30 NOTE — Progress Notes (Signed)
  Subjective:    Patient ID: Shawn Brooks, male    DOB: [birth year], 38 y.o.   MRN: [id number]  HPI  He comes in for followup of his HIV. 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 He denies any missed doses.  He has gone to [name] for surgical and gastroenterology care for his anal warts, chronic diarrhea and hemorrhoids. 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 No weight loss, no diarrhea.  Back pain being managed by sports medicine.  Denies more than one or two missed doses a month.   He also complains of erectile dysfunction and wants pt assistance for Viagra.  Also rash on back.  Was in ED last night after a seizure.  No history of seizure.  Smokes marijuana but denies other drugs or alcohol.     Review of Systems  Constitutional: Negative for fatigue and unexpected weight change.  HENT: Negative for sore throat and trouble swallowing.   Eyes: Negative for visual disturbance.  Respiratory: Negative for cough and shortness of breath.   Cardiovascular: Negative for chest pain.  Gastrointestinal: Negative for nausea, abdominal pain, diarrhea and anal bleeding.  Genitourinary: Negative for discharge and genital sores.  Musculoskeletal: Positive for back pain.  Neurological: Negative for dizziness and headaches.  Hematological: Negative for adenopathy.  Psychiatric/Behavioral: Negative for dysphoric mood.       Objective:   Physical Exam  Constitutional: He appears well-developed and well-nourished. No distress.  HENT:  Mouth/Throat: Oropharynx is clear and moist. No oropharyngeal exudate.  Cardiovascular: Normal rate, regular rhythm and normal heart sounds.  Exam reveals no gallop and no friction rub.   No murmur heard. Pulmonary/Chest: Effort normal and breath sounds normal. No respiratory distress. He has no wheezes. He has no rales.  Lymphadenopathy:    He has no cervical adenopathy.  Neurological:  He is alert.  Skin: Skin is warm and dry. No rash noted.  Psychiatric: He has a normal mood and affect.          Assessment & Plan:

## 2013-08-30 NOTE — Discharge Instructions (Signed)
NO DRIVING AFTER HAVING A SEIZURE UNTIL CLEARED BY YOUR DOCTOR.   Abrasion An abrasion is a cut or scrape of the skin. Abrasions do not extend through all layers of the skin and most heal within 10 days. It is important to care for your abrasion properly to prevent infection. CAUSES  Most abrasions are caused by falling on, or gliding across, the ground or other surface. When your skin rubs on something, the outer and inner layer of skin rubs off, causing an abrasion. DIAGNOSIS  Your caregiver will be able to diagnose an abrasion during a physical exam.  TREATMENT  Your treatment depends on how large and deep the abrasion is. Generally, your abrasion will be cleaned with water and a mild soap to remove any dirt or debris. An antibiotic ointment may be put over the abrasion to prevent an infection. A bandage (dressing) may be wrapped around the abrasion to keep it from getting dirty.  You may need a tetanus shot if:  You cannot remember when you had your last tetanus shot.  You have never had a tetanus shot.  The injury broke your skin. If you get a tetanus shot, your arm may swell, get red, and feel warm to the touch. This is common and not a problem. If you need a tetanus shot and you choose not to have one, there is a rare chance of getting tetanus. Sickness from tetanus can be serious.  HOME CARE INSTRUCTIONS   If a dressing was applied, change it at least once a day or as directed by your caregiver. If the bandage sticks, soak it off with warm water.   Wash the area with water and a mild soap to remove all the ointment 2 times a day. Rinse off the soap and pat the area dry with a clean towel.   Reapply any ointment as directed by your caregiver. This will help prevent infection and keep the bandage from sticking. Use gauze over the wound and under the dressing to help keep the bandage from sticking.   Change your dressing right away if it becomes wet or dirty.   Only take  over-the-counter or prescription medicines for pain, discomfort, or fever as directed by your caregiver.   Follow up with your caregiver within 24 48 hours for a wound check, or as directed. If you were not given a wound-check appointment, look closely at your abrasion for redness, swelling, or pus. These are signs of infection. SEEK IMMEDIATE MEDICAL CARE IF:   You have increasing pain in the wound.   You have redness, swelling, or tenderness around the wound.   You have pus coming from the wound.   You have a fever or persistent symptoms for more than 2 3 days.  You have a fever and your symptoms suddenly get worse.  You have a bad smell coming from the wound or dressing.  MAKE SURE YOU:   Understand these instructions.  Will watch your condition.  Will get help right away if you are not doing well or get worse. Document Released: 03/24/2005 Document Revised: 05/31/2012 Document Reviewed: 05/18/2011 [facility] Patient Information 2014 [place], [place]. Seizure, Adult A seizure is abnormal electrical activity in the brain. Seizures usually last from 30 seconds to 2 minutes. There are various types of seizures. Before a seizure, you may have a warning sensation (aura) that a seizure is about to occur. An aura may include the following symptoms:   Fear or anxiety.  Nausea.  Feeling like  the room is spinning (vertigo).  Vision changes, such as seeing flashing lights or spots. Common symptoms during a seizure include:  A change in attention or behavior (altered mental status).  Convulsions with rhythmic jerking movements.  Drooling.  Rapid eye movements.  Grunting.  Loss of bladder and bowel control.  Bitter taste in the mouth.  Tongue biting. After a seizure, you may feel confused and sleepy. You may also have an injury resulting from convulsions during the seizure. HOME CARE INSTRUCTIONS   If you are given medicines, take them exactly as prescribed by your  health care provider.  Keep all follow-up appointments as directed by your health care provider.  Do not swim or drive or engage in risky activity during which a seizure could cause further injury to you or others until your health care provider says it is OK.  Get adequate rest.  Teach friends and family what to do if you have a seizure. They should:  Lay you on the ground to prevent a fall.  Put a cushion under your head.  Loosen any tight clothing around your neck.  Turn you on your side. If vomiting occurs, this helps keep your airway clear.  Stay with you until you recover.  Know whether or not you need emergency care. SEEK IMMEDIATE MEDICAL CARE IF:  The seizure lasts longer than 5 minutes.  The seizure is severe or you do not wake up immediately after the seizure.  You have an altered mental status after the seizure.  You are having more frequent or worsening seizures. Someone should drive you to the emergency department or call local emergency services (911 in [place] MAKE SURE YOU:  Understand these instructions.  Will watch your condition.  Will get help right away if you are not doing well or get worse. Document Released: 06/11/2000 Document Revised: 04/04/2013 Document Reviewed: 01/24/2013 [facility] Patient Information 2014 [place].

## 2013-08-31 ENCOUNTER — Telehealth: Payer: Self-pay | Admitting: *Deleted

## 2013-08-31 NOTE — ED Provider Notes (Signed)
Medical screening examination/treatment/procedure(s) were performed by non-physician practitioner and as supervising physician I was immediately available for consultation/collaboration.   EKG Interpretation   Date/Time:  Wednesday August 29 2013 23:01:13 EST Ventricular Rate:  59 PR Interval:  153 QRS Duration: 94 QT Interval:  373 QTC Calculation: 369 R Axis:   118 Text Interpretation:  Sinus rhythm Right axis deviation ST elev, probable  normal early repol pattern ED PHYSICIAN INTERPRETATION AVAILABLE IN [facility] Confirmed by TEST, Record ([id number]) on 08/31/2013 7:24:19 AM       Richarda Blade, MD 08/31/13 [phone]

## 2013-08-31 NOTE — Telephone Encounter (Signed)
Patient left paperwork for Dr. Linus Salmons to fill out for viagra patient assistance.  Copy made for patient's chart, originals placed at front desk for patient to pick up.  Patient aware. Landis Gandy, RN

## 2013-09-18 ENCOUNTER — Ambulatory Visit: Payer: Self-pay | Admitting: Internal Medicine

## 2013-09-19 ENCOUNTER — Other Ambulatory Visit: Payer: Self-pay | Admitting: Internal Medicine

## 2013-09-20 ENCOUNTER — Telehealth: Payer: Self-pay | Admitting: *Deleted

## 2013-09-20 NOTE — Telephone Encounter (Signed)
Patient called to inquire about the status of a referral to [facility] and a Dermatology referral. Patient does have an orange discount card and told him I could make referral to derm with [facility] 09/26/13. Forwarded to [facility] regarding Neuro. Myrtis Hopping

## 2013-09-24 ENCOUNTER — Telehealth: Payer: Self-pay | Admitting: *Deleted

## 2013-09-24 NOTE — Telephone Encounter (Signed)
Called patient and notified him of appt at [facility], neurology at [facility] for 11/01/13 at 1:00 pm. Located at [address] in [place] # [phone]. They do participate with the orange discount card and patient needs to renew before appt. Given number to financial counselor [phone] to confirm how much of the visit will be covered. Office note faxed to [phone]. Myrtis Hopping

## 2013-10-18 ENCOUNTER — Other Ambulatory Visit: Payer: Self-pay | Admitting: *Deleted

## 2013-10-18 DIAGNOSIS — F32A Depression, unspecified: Secondary | ICD-10-CM

## 2013-10-18 DIAGNOSIS — F329 Major depressive disorder, single episode, unspecified: Secondary | ICD-10-CM

## 2013-10-18 MED ORDER — ESCITALOPRAM OXALATE 20 MG PO TABS: 20.0000 mg | ORAL_TABLET | Freq: Every day | ORAL | Status: DC

## 2013-10-23 ENCOUNTER — Other Ambulatory Visit: Payer: Self-pay | Admitting: Internal Medicine

## 2013-10-30 ENCOUNTER — Telehealth: Payer: Self-pay | Admitting: *Deleted

## 2013-10-30 NOTE — Telephone Encounter (Signed)
Dermatology referral faxed to [facility]. Patient aware Shawn Brooks

## 2013-11-08 ENCOUNTER — Ambulatory Visit: Payer: Self-pay | Admitting: Internal Medicine

## 2013-11-09 ENCOUNTER — Ambulatory Visit: Payer: No Typology Code available for payment source | Attending: Internal Medicine | Admitting: Internal Medicine

## 2013-11-09 ENCOUNTER — Encounter: Payer: Self-pay | Admitting: Internal Medicine

## 2013-11-09 VITALS — BP 129/79 | HR 68 | Temp 98.3°F | Resp 22 | Ht 68.75 in | Wt 226.6 lb

## 2013-11-09 DIAGNOSIS — Z21 Asymptomatic human immunodeficiency virus [HIV] infection status: Secondary | ICD-10-CM | POA: Insufficient documentation

## 2013-11-09 DIAGNOSIS — F17200 Nicotine dependence, unspecified, uncomplicated: Secondary | ICD-10-CM

## 2013-11-09 DIAGNOSIS — R195 Other fecal abnormalities: Secondary | ICD-10-CM

## 2013-11-09 DIAGNOSIS — E785 Hyperlipidemia, unspecified: Secondary | ICD-10-CM | POA: Insufficient documentation

## 2013-11-09 DIAGNOSIS — F411 Generalized anxiety disorder: Secondary | ICD-10-CM | POA: Insufficient documentation

## 2013-11-09 DIAGNOSIS — F329 Major depressive disorder, single episode, unspecified: Secondary | ICD-10-CM | POA: Insufficient documentation

## 2013-11-09 DIAGNOSIS — M25569 Pain in unspecified knee: Secondary | ICD-10-CM | POA: Insufficient documentation

## 2013-11-09 DIAGNOSIS — D649 Anemia, unspecified: Secondary | ICD-10-CM | POA: Insufficient documentation

## 2013-11-09 DIAGNOSIS — M549 Dorsalgia, unspecified: Secondary | ICD-10-CM

## 2013-11-09 DIAGNOSIS — Z79899 Other long term (current) drug therapy: Secondary | ICD-10-CM | POA: Insufficient documentation

## 2013-11-09 DIAGNOSIS — IMO0001 Reserved for inherently not codable concepts without codable children: Secondary | ICD-10-CM

## 2013-11-09 DIAGNOSIS — Z7689 Persons encountering health services in other specified circumstances: Secondary | ICD-10-CM

## 2013-11-09 DIAGNOSIS — F3289 Other specified depressive episodes: Secondary | ICD-10-CM | POA: Insufficient documentation

## 2013-11-09 DIAGNOSIS — Z7189 Other specified counseling: Secondary | ICD-10-CM

## 2013-11-09 MED ORDER — GABAPENTIN 100 MG PO CAPS: 100.0000 mg | ORAL_CAPSULE | Freq: Three times a day (TID) | ORAL | Status: DC

## 2013-11-09 NOTE — Progress Notes (Signed)
Patient is here to establish care Follow-up for seizure 2 weeks ago. Seen in ED at that time. States he has been feeling left-sided numbness since seizure States back pain following work-related injury 1999 States long history of right knee and right hip pain.

## 2013-11-09 NOTE — Progress Notes (Signed)
Patient ID: Shawn Brooks, male   DOB: 12-05-1975, 38 y.o.   MRN: [id number]  [id number]  [id number]  DOB - Jan 31, 1976  CC:  Chief Complaint  Patient presents with  . Establish Care  . Back Pain  . Knee Pain  . Hip Pain       HPI: Shawn Brooks is a 38 y.o. male here today to establish medical care. Patient reports that he was recently seen in the ER for a seizure.  He reports that he has never had a seizure before. He denied alcohol use during the time but admits to marijuana use on particular night of seizure.  He denies any seizure activity since hospital discharge. Patient c/o back, hip, and bilateral knee pain since 2009.  He reports that he suffered a fall and landed on his back and has had problems since them.  He reports that the pain goes down his leg and sometimes cause tingling.  His imaging studies reveal a bulging disc.  He denies any bowel or bladder dysfunction at this time.  He reports that his knees feel like they are locking while walking at times.  Patient reports that he was using Gabapentin at one time for pain relief which was very helpful at reducing his pain.    No Known Allergies Past Medical History  Diagnosis Date  . HIV infection   . Hyperlipidemia   . Blood dyscrasia     HIV  . Chronic back pain   . Depression   . Anxiety   . Headache(784.0)   . Vertigo   . DJD (degenerative joint disease)   . Anemia    Current Outpatient Prescriptions on File Prior to Visit  Medication Sig Dispense Refill  . Darunavir Ethanolate (PREZISTA) 800 MG tablet Take 1 tablet (800 mg total) by mouth daily.  30 tablet  11  . dolutegravir (TIVICAY) 50 MG tablet Take 1 tablet (50 mg total) by mouth daily.  30 tablet  11  . doxylamine, Sleep, (SLEEP AID) 25 MG tablet Take 25 mg by mouth at bedtime as needed for sleep.      Marland Kitchen emtricitabine-tenofovir (TRUVADA) 200-300 MG per tablet Take 1 tablet by mouth daily.  30 tablet  11  . escitalopram (LEXAPRO) 20 MG tablet Take 1  tablet (20 mg total) by mouth daily.  30 tablet  6  . omeprazole (PRILOSEC) 20 MG capsule Take 1 mg by mouth at bedtime.      . ritonavir (NORVIR) 100 MG TABS tablet Take 1 tablet (100 mg total) by mouth daily.  30 tablet  11  . traZODone (DESYREL) 50 MG tablet Take 50 mg by mouth at bedtime. Mental health      . valACYclovir (VALTREX) 500 MG tablet Take by mouth. Take 500 mg by mouth 2 (two) times daily as needed. For flare-ups.      . cyclobenzaprine (FLEXERIL) 10 MG tablet Take 1 tablet (10 mg total) by mouth 2 (two) times daily as needed for muscle spasms.  20 tablet  0  . omeprazole (PRILOSEC) 20 MG capsule TAKE ONE CAPSULE BY MOUTH EVERY DAY  30 capsule  3  . oxyCODONE-acetaminophen (PERCOCET/ROXICET) 5-325 MG per tablet Take 1-2 tablets by mouth every 4 (four) hours as needed for severe pain.  20 tablet  0  . sildenafil (VIAGRA) 50 MG tablet Take 1 tablet (50 mg total) by mouth daily as needed for erectile dysfunction.  10 tablet  0  . TIVICAY 50 MG tablet TAKE 1  TABLET BY MOUTH DAILY  30 tablet  3   No current facility-administered medications on file prior to visit.   Family History  Problem Relation Age of Onset  . Cancer Mother     laryngeal  . Pneumonia Father   . Diabetes Father   . Hypertension Sister   . Crohn's disease Brother   . Crohn's disease Maternal Grandmother   . Cancer Maternal Grandmother   . Cancer Maternal Grandfather    History   Social History  . Marital Status: Divorced    Spouse Name: N/A    Number of Children: N/A  . Years of Education: N/A   Occupational History  . Not on file.   Social History Main Topics  . Smoking status: Current Every Day Smoker -- 0.50 packs/day    Types: Cigarettes  . Smokeless tobacco: Never Used  . Alcohol Use: 1.0 oz/week    2 drink(s) per week     Comment: beer at times. not often  . Drug Use: 7.00 per week    Special: Marijuana  . Sexual Activity: Yes    Partners: Male     Comment: accepted condoms   Other  Topics Concern  . Not on file   Social History Narrative  . No narrative on file    Review of Systems  Constitutional: Negative for fever and chills.  HENT: Negative for tinnitus.   Eyes: Negative.   Respiratory: Positive for shortness of breath (with laying down). Negative for cough and hemoptysis.   Gastrointestinal: Positive for blood in stool (in past. last episode 1 month prior to visit). Negative for nausea, vomiting and abdominal pain.  Genitourinary: Negative.   Musculoskeletal: Positive for back pain (from injury in 2009, hx of bulging disc) and joint pain (hip pain and swelling).  Skin: Rash: abrasion on mid forehead from fall.  Neurological: Positive for tingling (on left side since fall from seizure), seizures (first one 2 weeks ago) and headaches (migraines). Negative for dizziness, tremors and weakness.  Endo/Heme/Allergies: Negative.   Psychiatric/Behavioral: Positive for depression (psychiatrist managing). The patient is nervous/anxious.       Objective:   Filed Vitals:   11/09/13 1539  BP: 129/79  Pulse: 68  Temp: 98.3 F (36.8 C)  Resp: 22    Physical Exam: Constitutional: Patient appears well-developed and well-nourished. No distress. HENT: Normocephalic, atraumatic, External right and left ear normal. Oropharynx is clear and moist.  Eyes: Conjunctivae and EOM are normal. PERRLA, no scleral icterus. Neck: Normal ROM. Neck supple. No JVD. No tracheal deviation. No thyromegaly. CVS: RRR, S1/S2 +, no murmurs, no gallops, no carotid bruit.  Pulmonary: Effort and breath sounds normal, no stridor, rhonchi, wheezes, rales.  Abdominal: Soft. BS +, no distension, tenderness, rebound or guarding.  Musculoskeletal: Normal range of motion. No edema and no tenderness.  Lymphadenopathy: No lymphadenopathy noted, cervical Neuro: Alert. Normal reflexes, muscle tone coordination. No cranial nerve deficit. Skin: Skin is warm and dry. No rash noted. Not diaphoretic. No  erythema. No pallor. Psychiatric: Normal mood and affect. Behavior, judgment, thought content normal.  Lab Results  Component Value Date   WBC 8.4 08/29/2013   HGB 14.9 08/29/2013   HCT 43.8 08/29/2013   MCV 91.3 08/29/2013   PLT 171 08/29/2013   Lab Results  Component Value Date   CREATININE 1.33 08/29/2013   BUN 10 08/29/2013   NA 137 08/29/2013   K 4.3 08/29/2013   CL 101 08/29/2013   CO2 23 08/29/2013  No results found for this basename: HGBA1C   Lipid Panel     Component Value Date/Time   CHOL 157 12/03/2011 1055   TRIG 67 12/03/2011 1055   HDL 41 12/03/2011 1055   CHOLHDL 3.8 12/03/2011 1055   VLDL 13 12/03/2011 1055   LDLCALC 103* 12/03/2011 1055       Assessment and plan:   Amed was seen today for establish care, back pain, knee pain and hip pain.  Diagnoses and associated orders for this visit:  Encounter to establish care - Lipid panel; Future - TSH; Future - Hemoglobin A1c; Future - CBC; Future - COMPLETE METABOLIC PANEL WITH GFR; Future - Cancel: PSA - PSA; Future  Occult blood in stools - Ambulatory referral to Gastroenterology. Patient reports that he has his prostate and evaluation of occult stool numerous times and refuses assessment today. He reports a history of polyps on colonoscopy.   Back pain - gabapentin (NEURONTIN) 100 MG capsule; Take 1 capsule (100 mg total) by mouth 3 (three) times daily.  Smoking Tobacco cessation discussed and its harmful affects.   Return in about 1 week (around 11/16/2013) for Lab Visit.  The patient was given clear instructions to go to ER or return to medical center if continue to find blood in stool.   Shawn Manning, NP-C [facility] [phone] 11/10/2013, 3:37 PM

## 2013-11-09 NOTE — Patient Instructions (Signed)
Back Pain, Adult Low back pain is very common. About 1 in 5 people have back pain.The cause of low back pain is rarely dangerous. The pain often gets better over time.About half of people with a sudden onset of back pain feel better in just 2 weeks. About 8 in 10 people feel better by 6 weeks.  CAUSES Some common causes of back pain include:  Strain of the muscles or ligaments supporting the spine.  Wear and tear (degeneration) of the spinal discs.  Arthritis.  Direct injury to the back. DIAGNOSIS Most of the time, the direct cause of low back pain is not known.However, back pain can be treated effectively even when the exact cause of the pain is unknown.Answering your caregiver's questions about your overall health and symptoms is one of the most accurate ways to make sure the cause of your pain is not dangerous. If your caregiver needs more information, he or she may order lab work or imaging tests (X-rays or MRIs).However, even if imaging tests show changes in your back, this usually does not require surgery. HOME CARE INSTRUCTIONS For many people, back pain returns.Since low back pain is rarely dangerous, it is often a condition that people can learn to manageon their own.   Remain active. It is stressful on the back to sit or stand in one place. Do not sit, drive, or stand in one place for more than 30 minutes at a time. Take short walks on level surfaces as soon as pain allows.Try to increase the length of time you walk each day.  Do not stay in bed.Resting more than 1 or 2 days can delay your recovery.  Do not avoid exercise or work.Your body is made to move.It is not dangerous to be active, even though your back may hurt.Your back will likely heal faster if you return to being active before your pain is gone.  Pay attention to your body when you bend and lift. Many people have less discomfortwhen lifting if they bend their knees, keep the load close to their bodies,and  avoid twisting. Often, the most comfortable positions are those that put less stress on your recovering back.  Find a comfortable position to sleep. Use a firm mattress and lie on your side with your knees slightly bent. If you lie on your back, put a pillow under your knees.  Only take over-the-counter or prescription medicines as directed by your caregiver. Over-the-counter medicines to reduce pain and inflammation are often the most helpful.Your caregiver may prescribe muscle relaxant drugs.These medicines help dull your pain so you can more quickly return to your normal activities and healthy exercise.  Put ice on the injured area.  Put ice in a plastic bag.  Place a towel between your skin and the bag.  Leave the ice on for 15-20 minutes, 03-04 times a day for the first 2 to 3 days. After that, ice and heat may be alternated to reduce pain and spasms.  Ask your caregiver about trying back exercises and gentle massage. This may be of some benefit.  Avoid feeling anxious or stressed.Stress increases muscle tension and can worsen back pain.It is important to recognize when you are anxious or stressed and learn ways to manage it.Exercise is a great option. SEEK MEDICAL CARE IF:  You have pain that is not relieved with rest or medicine.  You have pain that does not improve in 1 week.  You have new symptoms.  You are generally not feeling well. SEEK   IMMEDIATE MEDICAL CARE IF:   You have pain that radiates from your back into your legs.  You develop new bowel or bladder control problems.  You have unusual weakness or numbness in your arms or legs.  You develop nausea or vomiting.  You develop abdominal pain.  You feel faint. Document Released: 06/14/2005 Document Revised: 12/14/2011 Document Reviewed: 11/02/2010 Exit[employer] Patient Information 2014 [employer].  

## 2013-11-16 ENCOUNTER — Ambulatory Visit: Payer: No Typology Code available for payment source | Attending: Internal Medicine

## 2013-11-16 DIAGNOSIS — Z7689 Persons encountering health services in other specified circumstances: Secondary | ICD-10-CM

## 2013-11-16 DIAGNOSIS — B20 Human immunodeficiency virus [HIV] disease: Secondary | ICD-10-CM

## 2013-11-16 LAB — COMPLETE METABOLIC PANEL WITH GFR
ALT: 18 U/L (ref 0–53)
AST: 21 U/L (ref 0–37)
Albumin: 4 g/dL (ref 3.5–5.2)
Alkaline Phosphatase: 90 U/L (ref 39–117)
BILIRUBIN TOTAL: 0.3 mg/dL (ref 0.2–1.2)
BUN: 13 mg/dL (ref 6–23)
CALCIUM: 9.2 mg/dL (ref 8.4–10.5)
CO2: 25 mEq/L (ref 19–32)
CREATININE: 1.41 mg/dL — AB (ref 0.50–1.35)
Chloride: 104 mEq/L (ref 96–112)
GFR, EST AFRICAN AMERICAN: 72 mL/min
GFR, EST NON AFRICAN AMERICAN: 62 mL/min
GLUCOSE: 124 mg/dL — AB (ref 70–99)
Potassium: 4 mEq/L (ref 3.5–5.3)
Sodium: 137 mEq/L (ref 135–145)
Total Protein: 7.3 g/dL (ref 6.0–8.3)

## 2013-11-16 LAB — HEMOGLOBIN A1C
Hgb A1c MFr Bld: 5.7 % — ABNORMAL HIGH (ref <5.7)
Mean Plasma Glucose: 117 mg/dL — ABNORMAL HIGH (ref <117)

## 2013-11-16 LAB — CBC
HCT: 45.5 % (ref 39.0–52.0)
Hemoglobin: 16.2 g/dL (ref 13.0–17.0)
MCH: 31 pg (ref 26.0–34.0)
MCHC: 35.6 g/dL (ref 30.0–36.0)
MCV: 87.2 fL (ref 78.0–100.0)
Platelets: 196 10*3/uL (ref 150–400)
RBC: 5.22 MIL/uL (ref 4.22–5.81)
RDW: 14 % (ref 11.5–15.5)
WBC: 8.1 10*3/uL (ref 4.0–10.5)

## 2013-11-16 LAB — LIPID PANEL
CHOL/HDL RATIO: 5.6 ratio
Cholesterol: 190 mg/dL (ref 0–200)
HDL: 34 mg/dL — AB (ref >39)
LDL Cholesterol: 125 mg/dL — ABNORMAL HIGH (ref 0–99)
TRIGLYCERIDES: 157 mg/dL — AB (ref <150)
VLDL: 31 mg/dL (ref 0–40)

## 2013-11-16 LAB — TSH: TSH: 1.137 u[IU]/mL (ref 0.350–4.500)

## 2013-11-17 LAB — CBC WITH DIFFERENTIAL/PLATELET
BASOS ABS: 0 10*3/uL (ref 0.0–0.1)
BASOS PCT: 0 % (ref 0–1)
EOS PCT: 1 % (ref 0–5)
Eosinophils Absolute: 0.1 10*3/uL (ref 0.0–0.7)
HEMATOCRIT: 45.5 % (ref 39.0–52.0)
Hemoglobin: 16.3 g/dL (ref 13.0–17.0)
Lymphocytes Relative: 35 % (ref 12–46)
Lymphs Abs: 2.9 10*3/uL (ref 0.7–4.0)
MCH: 31.8 pg (ref 26.0–34.0)
MCHC: 35.8 g/dL (ref 30.0–36.0)
MCV: 88.9 fL (ref 78.0–100.0)
MONO ABS: 0.6 10*3/uL (ref 0.1–1.0)
Monocytes Relative: 7 % (ref 3–12)
NEUTROS ABS: 4.7 10*3/uL (ref 1.7–7.7)
Neutrophils Relative %: 57 % (ref 43–77)
PLATELETS: 186 10*3/uL (ref 150–400)
RBC: 5.12 MIL/uL (ref 4.22–5.81)
RDW: 14.5 % (ref 11.5–15.5)
WBC: 8.2 10*3/uL (ref 4.0–10.5)

## 2013-11-17 LAB — COMPLETE METABOLIC PANEL WITH GFR
ALBUMIN: 3.9 g/dL (ref 3.5–5.2)
ALT: 18 U/L (ref 0–53)
AST: 22 U/L (ref 0–37)
Alkaline Phosphatase: 86 U/L (ref 39–117)
BUN: 12 mg/dL (ref 6–23)
CO2: 26 meq/L (ref 19–32)
Calcium: 9.1 mg/dL (ref 8.4–10.5)
Chloride: 103 mEq/L (ref 96–112)
Creat: 1.31 mg/dL (ref 0.50–1.35)
GFR, Est African American: 78 mL/min
GFR, Est Non African American: 68 mL/min
Glucose, Bld: 119 mg/dL — ABNORMAL HIGH (ref 70–99)
POTASSIUM: 4 meq/L (ref 3.5–5.3)
SODIUM: 136 meq/L (ref 135–145)
TOTAL PROTEIN: 7.2 g/dL (ref 6.0–8.3)
Total Bilirubin: 0.3 mg/dL (ref 0.2–1.2)

## 2013-11-17 LAB — PSA: PSA: 0.77 ng/mL (ref <=4.00)

## 2013-11-17 LAB — LIPID PANEL
Cholesterol: 198 mg/dL (ref 0–200)
HDL: 33 mg/dL — AB (ref >39)
LDL Cholesterol: 133 mg/dL — ABNORMAL HIGH (ref 0–99)
TRIGLYCERIDES: 161 mg/dL — AB (ref <150)
Total CHOL/HDL Ratio: 6 Ratio
VLDL: 32 mg/dL (ref 0–40)

## 2013-11-17 LAB — RPR

## 2013-11-20 LAB — HIV-1 RNA QUANT-NO REFLEX-BLD
HIV 1 RNA Quant: 50 copies/mL — ABNORMAL HIGH (ref <20)
HIV-1 RNA Quant, Log: 1.7 {Log} — ABNORMAL HIGH (ref <1.30)

## 2013-11-21 ENCOUNTER — Telehealth: Payer: Self-pay

## 2013-11-21 MED ORDER — SIMVASTATIN 20 MG PO TABS
20.0000 mg | ORAL_TABLET | Freq: Every day | ORAL | Status: DC
Start: 2013-11-21 — End: 2014-08-13

## 2013-11-21 NOTE — Telephone Encounter (Signed)
Spoke with patient in reference to his lab results [facility]

## 2013-11-21 NOTE — Telephone Encounter (Signed)
Message copied by Dorothe Pea on Wed Nov 21, 2013  2:38 PM ------      Message from: Chari Manning A      Created: Wed Nov 21, 2013 10:08 AM       Please let patient know his cholesterol is high. Educated patient on dietary and lifestyle modifications he needs to make to lower cholesterol. Since description of simvastatin 20 mg each bedtime to pharmacy. Daily 30 tablets with 3 refills. Thanks. ------

## 2013-11-23 ENCOUNTER — Other Ambulatory Visit: Payer: Self-pay | Admitting: Internal Medicine

## 2013-12-04 ENCOUNTER — Telehealth: Payer: Self-pay | Admitting: *Deleted

## 2013-12-04 NOTE — Telephone Encounter (Signed)
Notified through [name] at [facility] that they were unable to schedule patient with Dermatology. She stated that the more serious conditions such as suspected skin cancer would always have top priority. She suggested that patient's PCP evaluate the rash. Patient notified and he said he would schedule an appointment with his PCP. Shawn Brooks

## 2013-12-07 ENCOUNTER — Encounter: Payer: Self-pay | Admitting: Internal Medicine

## 2013-12-25 ENCOUNTER — Ambulatory Visit: Payer: Self-pay

## 2013-12-26 ENCOUNTER — Other Ambulatory Visit: Payer: Self-pay | Admitting: Licensed Clinical Social Worker

## 2013-12-26 MED ORDER — EMTRICITABINE-TENOFOVIR DF 200-300 MG PO TABS: 1.0000 | ORAL_TABLET | Freq: Every day | ORAL | Status: DC

## 2013-12-26 MED ORDER — DOLUTEGRAVIR SODIUM 50 MG PO TABS: 50.0000 mg | ORAL_TABLET | Freq: Every day | ORAL | Status: DC

## 2013-12-31 ENCOUNTER — Ambulatory Visit: Payer: Self-pay | Admitting: Internal Medicine

## 2014-01-17 ENCOUNTER — Other Ambulatory Visit: Payer: Self-pay | Admitting: Internal Medicine

## 2014-01-18 ENCOUNTER — Encounter: Payer: Self-pay | Admitting: Emergency Medicine

## 2014-01-18 MED ORDER — ACETAMINOPHEN 500 MG PO TABS: 500.0000 mg | ORAL_TABLET | Freq: Four times a day (QID) | ORAL | Status: DC | PRN

## 2014-01-18 MED ORDER — IBUPROFEN 800 MG PO TABS: 800.0000 mg | ORAL_TABLET | Freq: Three times a day (TID) | ORAL | Status: DC | PRN

## 2014-01-18 MED ORDER — PENICILLIN V POTASSIUM 500 MG PO TABS: ORAL_TABLET | ORAL | Status: DC

## 2014-01-18 NOTE — Progress Notes (Signed)
Patient ID: Shawn Brooks, male   DOB: 22-Feb-1976, 38 y.o.   MRN: [id number] Pt comes in with c/o upper and lower dental pain with swollen glands since Monday. States he has scheduled dentist appointment next Tuesday 01/22/14. Denies abscess,bleeding or n/v/fever

## 2014-01-18 NOTE — Patient Instructions (Signed)
Take prescribed antibiotics with completed course and pain medication as needed. See dentist Tuesday Return if symptoms worsen

## 2014-01-22 ENCOUNTER — Ambulatory Visit: Payer: Self-pay

## 2014-02-01 ENCOUNTER — Other Ambulatory Visit: Payer: Self-pay | Admitting: Internal Medicine

## 2014-02-07 ENCOUNTER — Telehealth: Payer: Self-pay | Admitting: *Deleted

## 2014-02-07 NOTE — Telephone Encounter (Signed)
Pt requesting referral for "MRI" be scheduled in [place], unable to pay "out-of-pocket" expense for this in [place].  [facility] does not have a current copy of the pt's [facility] "orange" card to process request.    Pt does not have a current order for MRI in [facility], only referral to Neurologist.   Pt needs to bring "Orange" card to [facility] for scanning to [facility].  Pt informed to bring card for scanning so that referral to [facility] can be processed.   Pt verbalized understanding.  Pt now being seen by PCP at [facility].

## 2014-02-12 ENCOUNTER — Ambulatory Visit (INDEPENDENT_AMBULATORY_CARE_PROVIDER_SITE_OTHER): Payer: No Typology Code available for payment source | Admitting: Internal Medicine

## 2014-02-12 ENCOUNTER — Encounter: Payer: Self-pay | Admitting: Internal Medicine

## 2014-02-12 VITALS — BP 100/70 | HR 78 | Ht 69.0 in | Wt 227.0 lb

## 2014-02-12 DIAGNOSIS — Z86010 Personal history of colonic polyps: Secondary | ICD-10-CM

## 2014-02-12 DIAGNOSIS — A630 Anogenital (venereal) warts: Secondary | ICD-10-CM

## 2014-02-12 DIAGNOSIS — D013 Carcinoma in situ of anus and anal canal: Secondary | ICD-10-CM

## 2014-02-12 DIAGNOSIS — B20 Human immunodeficiency virus [HIV] disease: Secondary | ICD-10-CM

## 2014-02-12 DIAGNOSIS — K6282 Dysplasia of anus: Secondary | ICD-10-CM

## 2014-02-12 MED ORDER — HYOSCYAMINE SULFATE 0.125 MG SL SUBL: 0.1250 mg | SUBLINGUAL_TABLET | SUBLINGUAL | Status: DC | PRN

## 2014-02-12 NOTE — Patient Instructions (Addendum)
We have sent the following medications to your pharmacy for you to pick up at your convenience:Levsin.  Please contact our office at [phone] after searching your records with name of your prior Gastroenterologist.  You have been scheduled for an appointment with Dr. Johney Maine at [facility]. Your appointment is on __________________ at _________________. Please arrive at _________________ for registration. Make certain to bring a list of current medications, including any over the counter medications or vitamins. Also bring your co-pay if you have one as well as your insurance cards. [facility] is located at [address], [address]. Should you need to reschedule your appointment, please contact them at [phone].  cc: Chari Manning, MD

## 2014-02-12 NOTE — Progress Notes (Signed)
Patient ID: Shawn Brooks, male   DOB: [birth year], 38 y.o.   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 He is here alone today. He reports an extensive history with surgery department at [facility] in relation to anal condyloma and perirectal abscess. It has been approximately one year since he was seen there, he reports when he lost insurance he was unable to followup care. He now has an orange card. 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 He was taken to the operating room on 11/13/2012 for EUA with I&D of phlegmon without frank purulence. He was treated with antibiotics. He then had another I&D on 12/29/2012. In the past he had an upper endoscopy performed by Dr. Michail Sermon in 2013 which showed H. pylori-negative erosive gastritis.  Today he reports on the whole he is doing okay. He does have alternating diarrhea and constipation. Prior to bowel movement he did have lower abdominal, bilateral, cramping which is relieved with defecation. He denies anal or rectal pain at this time. He has not seen blood in his stool recently, but has seen red blood intermittently over the last several years. He denies fevers or chills. He continues to take his HIV medication. He denies heartburn, dysphagia or odynophagia. No epigastric pain early satiety. He reports a stable weight. He does take omeprazole 20 mg daily.  He also states upper endoscopy and colonoscopy were performed approximately one year ago in [place]. Review of records from [facility] along with calling did not find evidence of upper or lower endoscopy. We also called to GI practices in [place] and have no record of these procedures.  Patient states polyps were found and removed but no evidence for Crohn's disease.  Past Medical History  Diagnosis Date  . HIV infection   . Hyperlipidemia   . Blood dyscrasia     HIV  . Chronic back pain   . Depression   . Anxiety   . Headache(784.0)   . Vertigo   . DJD (degenerative joint disease)   . Anemia     Past Surgical History  Procedure Laterality Date  . Wisdom tooth extraction    . Esophagogastroduodenoscopy  03/27/2012    Procedure: ESOPHAGOGASTRODUODENOSCOPY (EGD);  Surgeon: Lear Ng, MD;  Location: [facility];  Service: Endoscopy;  Laterality: N/A;    Outpatient Prescriptions Prior to Visit  Medication Sig Dispense Refill  . acetaminophen (TYLENOL) 500 MG tablet Take 1 tablet (500 mg total) by mouth every 6 (six) hours as needed.  30 tablet  0  . cyclobenzaprine (FLEXERIL) 10 MG tablet Take 1 tablet (10 mg total) by mouth 2 (two) times daily as needed for muscle spasms.  20 tablet  0  . dolutegravir (TIVICAY) 50 MG tablet Take 1 tablet (50 mg total) by mouth daily.  30 tablet  11  . dolutegravir (TIVICAY) 50 MG tablet Take 1 tablet (50 mg total) by mouth daily.  30 tablet  0  . emtricitabine-tenofovir (TRUVADA) 200-300 MG per tablet Take 1 tablet by mouth daily.  30 tablet  0  . escitalopram (LEXAPRO) 20 MG tablet Take 1 tablet (20 mg total) by mouth daily.  30 tablet  6  . gabapentin (NEURONTIN)  100 MG capsule Take 1 capsule (100 mg total) by mouth 3 (three) times daily.  90 capsule  1  . NORVIR 100 MG TABS tablet TAKE 1 TABLET BY MOUTH DAILY  30 tablet  6  . omeprazole (PRILOSEC) 20 MG capsule Take 1 mg by mouth at bedtime.      Marland Kitchen omeprazole (PRILOSEC) 20 MG capsule TAKE ONE CAPSULE BY MOUTH EVERY DAY  30 capsule  3  . oxyCODONE-acetaminophen (PERCOCET/ROXICET) 5-325 MG per tablet Take 1-2 tablets by mouth every 4 (four) hours as needed for severe pain.  20 tablet  0  . PREZISTA 800 MG tablet TAKE 1 TABLET BY MOUTH DAILY  30 tablet  6  . ritonavir  (NORVIR) 100 MG TABS tablet Take 1 tablet (100 mg total) by mouth daily.  30 tablet  11  . sildenafil (VIAGRA) 50 MG tablet Take 1 tablet (50 mg total) by mouth daily as needed for erectile dysfunction.  10 tablet  0  . simvastatin (ZOCOR) 20 MG tablet Take 1 tablet (20 mg total) by mouth at bedtime.  30 tablet  2  . TIVICAY 50 MG tablet TAKE 1 TABLET BY MOUTH DAILY  30 tablet  0  . traZODone (DESYREL) 50 MG tablet Take 50 mg by mouth at bedtime. Mental health      . valACYclovir (VALTREX) 500 MG tablet Take by mouth. Take 500 mg by mouth 2 (two) times daily as needed. For flare-ups.      Marland Kitchen doxylamine, Sleep, (SLEEP AID) 25 MG tablet Take 25 mg by mouth at bedtime as needed for sleep.      . penicillin v potassium (VEETID) 500 MG tablet Take 1 tablet every 6 hours for 5 days  20 tablet  0   No facility-administered medications prior to visit.    No Known Allergies  Family History  Problem Relation Age of Onset  . Cancer Mother     laryngeal  . Pneumonia Father   . Diabetes Father   . Hypertension Sister   . Crohn's disease Brother   . Crohn's disease Maternal Grandmother   . Cancer Maternal Grandmother     patient unsure of type  . Colon cancer Maternal Grandfather     History  Substance Use Topics  . Smoking status: Current Every Day Smoker -- 0.50 packs/day    Types: Cigarettes  . Smokeless tobacco: Never Used  . Alcohol Use: 1.0 oz/week    2 drink(s) per week     Comment: beer at times. not often    ROS: As per history of present illness, otherwise negative  BP 100/70  Pulse 78  Ht 5\' 9"  (1.753 m)  Wt 227 lb (102.967 kg)  BMI 33.51 kg/m2 Constitutional: Well-developed and well-nourished. No distress. HEENT: Normocephalic and atraumatic. Oropharynx is clear and moist. No oropharyngeal exudate. Conjunctivae are normal.  No scleral icterus. Neck: Neck supple. Trachea midline. Cardiovascular: Normal rate, regular rhythm and intact distal pulses. No  M/R/G Pulmonary/chest: Effort normal and breath sounds normal. No wheezing, rales or rhonchi. Abdominal: Soft, nontender, nondistended. Bowel sounds active throughout.  Rectal: No perianal fluctuance or pain, no obvious condyloma, rectal exam without masses or overt tenderness, light brown stool guaiac negative Extremities: no clubbing, cyanosis, or edema Neurological: Alert and oriented to person place and time. Skin: Skin is warm and dry. No rashes noted. Psychiatric: Normal mood and affect. Behavior is normal.  RELEVANT LABS AND IMAGING: CBC    Component Value Date/Time   WBC 8.2  11/16/2013 1029   WBC 8.1 11/16/2013 1029   RBC 5.12 11/16/2013 1029   RBC 5.22 11/16/2013 1029   HGB 16.3 11/16/2013 1029   HGB 16.2 11/16/2013 1029   HCT 45.5 11/16/2013 1029   HCT 45.5 11/16/2013 1029   PLT 186 11/16/2013 1029   PLT 196 11/16/2013 1029   MCV 88.9 11/16/2013 1029   MCV 87.2 11/16/2013 1029   MCH 31.8 11/16/2013 1029   MCH 31.0 11/16/2013 1029   MCHC 35.8 11/16/2013 1029   MCHC 35.6 11/16/2013 1029   RDW 14.5 11/16/2013 1029   RDW 14.0 11/16/2013 1029   LYMPHSABS 2.9 11/16/2013 1029   MONOABS 0.6 11/16/2013 1029   EOSABS 0.1 11/16/2013 1029   BASOSABS 0.0 11/16/2013 1029    CMP     Component Value Date/Time   NA 136 11/16/2013 1029   NA 137 11/16/2013 1029   K 4.0 11/16/2013 1029   K 4.0 11/16/2013 1029   CL 103 11/16/2013 1029   CL 104 11/16/2013 1029   CO2 26 11/16/2013 1029   CO2 25 11/16/2013 1029   GLUCOSE 119* 11/16/2013 1029   GLUCOSE 124* 11/16/2013 1029   BUN 12 11/16/2013 1029   BUN 13 11/16/2013 1029   CREATININE 1.31 11/16/2013 1029   CREATININE 1.41* 11/16/2013 1029   CREATININE 1.33 08/29/2013 2110   CALCIUM 9.1 11/16/2013 1029   CALCIUM 9.2 11/16/2013 1029   PROT 7.2 11/16/2013 1029   PROT 7.3 11/16/2013 1029   ALBUMIN 3.9 11/16/2013 1029   ALBUMIN 4.0 11/16/2013 1029   AST 22 11/16/2013 1029   AST 21 11/16/2013 1029   ALT 18 11/16/2013 1029   ALT 18 11/16/2013 1029   ALKPHOS 86 11/16/2013  1029   ALKPHOS 90 11/16/2013 1029   BILITOT 0.3 11/16/2013 1029   BILITOT 0.3 11/16/2013 1029   GFRNONAA 68 11/16/2013 1029   GFRNONAA 62 11/16/2013 1029   GFRNONAA 66* 08/29/2013 2110   GFRAA 78 11/16/2013 1029   GFRAA 72 11/16/2013 1029   GFRAA 76* 08/29/2013 2110    ASSESSMENT/PLAN: 38 yo male with PMH of HIV on anti-retroviral therapy, 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   1. Hx of AIN/anal condyloma/perirectal abscess --no evidence for ongoing abscess or perianal infection at present. I do think he needs to followup with Gen. surgery given his history of AIN and condyloma.  Surveillance of this issue is important. Unfortunately [facility] does not take the orange card.  He is now living in [place].  We contacted [facility] to seek referral but apparently no appointments are available until February or orange card patient's. We will try to facilitate an earlier appointment for him.  2. Hx of colon polyps -- effort made but unable to find where his colonoscopy was performed. He may have records of this at home and I have asked him to contact us with specifics. If we are unable to find this procedure, it may need repeating. No current alarm symptom. We'll give him Levsin to be used as needed and as directed for lower abdominal cramping.  3. HIV -- follow with Dr. Linus Salmons with ID

## 2014-02-18 ENCOUNTER — Other Ambulatory Visit: Payer: Self-pay | Admitting: Internal Medicine

## 2014-02-21 ENCOUNTER — Other Ambulatory Visit: Payer: Self-pay | Admitting: Internal Medicine

## 2014-02-21 ENCOUNTER — Encounter: Payer: Self-pay | Admitting: Internal Medicine

## 2014-02-21 ENCOUNTER — Ambulatory Visit: Payer: No Typology Code available for payment source | Attending: Internal Medicine | Admitting: Internal Medicine

## 2014-02-21 VITALS — BP 112/78 | HR 70 | Temp 98.2°F | Resp 20 | Ht 69.0 in | Wt 225.2 lb

## 2014-02-21 DIAGNOSIS — F411 Generalized anxiety disorder: Secondary | ICD-10-CM | POA: Insufficient documentation

## 2014-02-21 DIAGNOSIS — G8929 Other chronic pain: Secondary | ICD-10-CM | POA: Insufficient documentation

## 2014-02-21 DIAGNOSIS — F329 Major depressive disorder, single episode, unspecified: Secondary | ICD-10-CM | POA: Insufficient documentation

## 2014-02-21 DIAGNOSIS — K0889 Other specified disorders of teeth and supporting structures: Secondary | ICD-10-CM

## 2014-02-21 DIAGNOSIS — E785 Hyperlipidemia, unspecified: Secondary | ICD-10-CM | POA: Insufficient documentation

## 2014-02-21 DIAGNOSIS — F3289 Other specified depressive episodes: Secondary | ICD-10-CM | POA: Insufficient documentation

## 2014-02-21 DIAGNOSIS — R51 Headache: Secondary | ICD-10-CM | POA: Insufficient documentation

## 2014-02-21 DIAGNOSIS — R220 Localized swelling, mass and lump, head: Secondary | ICD-10-CM | POA: Insufficient documentation

## 2014-02-21 DIAGNOSIS — K089 Disorder of teeth and supporting structures, unspecified: Secondary | ICD-10-CM | POA: Insufficient documentation

## 2014-02-21 DIAGNOSIS — F17200 Nicotine dependence, unspecified, uncomplicated: Secondary | ICD-10-CM

## 2014-02-21 DIAGNOSIS — R42 Dizziness and giddiness: Secondary | ICD-10-CM | POA: Insufficient documentation

## 2014-02-21 DIAGNOSIS — M1990 Unspecified osteoarthritis, unspecified site: Secondary | ICD-10-CM | POA: Insufficient documentation

## 2014-02-21 DIAGNOSIS — M549 Dorsalgia, unspecified: Secondary | ICD-10-CM | POA: Insufficient documentation

## 2014-02-21 DIAGNOSIS — B20 Human immunodeficiency virus [HIV] disease: Secondary | ICD-10-CM | POA: Insufficient documentation

## 2014-02-21 DIAGNOSIS — D649 Anemia, unspecified: Secondary | ICD-10-CM | POA: Insufficient documentation

## 2014-02-21 DIAGNOSIS — R221 Localized swelling, mass and lump, neck: Secondary | ICD-10-CM

## 2014-02-21 DIAGNOSIS — D759 Disease of blood and blood-forming organs, unspecified: Secondary | ICD-10-CM | POA: Insufficient documentation

## 2014-02-21 MED ORDER — TRAMADOL HCL 50 MG PO TABS: 50.0000 mg | ORAL_TABLET | Freq: Three times a day (TID) | ORAL | Status: DC | PRN

## 2014-02-21 MED ORDER — AMOXICILLIN 500 MG PO CAPS: 500.0000 mg | ORAL_CAPSULE | Freq: Three times a day (TID) | ORAL | Status: DC

## 2014-02-21 NOTE — Progress Notes (Signed)
Patient ID: Shawn Brooks, male   DOB: 1975-08-13, 38 y.o.   MRN: [id number]  CC:  Dental pain   HPI:  Patient reports that he was seen here by a nurse two weeks ago and was given penicillin and tylenol for tooth pain.  He reports pain and swelling of his gums in the front lower region.  He reports that the penicillin helped for a while.  The swelling and pain started back on Sunday this week.  No Known Allergies Past Medical History  Diagnosis Date  . HIV infection   . Hyperlipidemia   . Blood dyscrasia     HIV  . Chronic back pain   . Depression   . Anxiety   . Headache(784.0)   . Vertigo   . DJD (degenerative joint disease)   . Anemia    Current Outpatient Prescriptions on File Prior to Visit  Medication Sig Dispense Refill  . acetaminophen (TYLENOL) 500 MG tablet Take 1 tablet (500 mg total) by mouth every 6 (six) hours as needed.  30 tablet  0  . dolutegravir (TIVICAY) 50 MG tablet Take 1 tablet (50 mg total) by mouth daily.  30 tablet  11  . emtricitabine-tenofovir (TRUVADA) 200-300 MG per tablet Take 1 tablet by mouth daily.  30 tablet  0  . escitalopram (LEXAPRO) 20 MG tablet Take 1 tablet (20 mg total) by mouth daily.  30 tablet  6  . gabapentin (NEURONTIN) 100 MG capsule Take 1 capsule (100 mg total) by mouth 3 (three) times daily.  90 capsule  1  . hyoscyamine (LEVSIN/SL) 0.125 MG SL tablet Place 1 tablet (0.125 mg total) under the tongue every 4 (four) hours as needed.  60 tablet  11  . NORVIR 100 MG TABS tablet TAKE 1 TABLET BY MOUTH DAILY  30 tablet  6  . omeprazole (PRILOSEC) 20 MG capsule Take 1 mg by mouth at bedtime.      Marland Kitchen omeprazole (PRILOSEC) 20 MG capsule TAKE ONE CAPSULE BY MOUTH EVERY DAY  30 capsule  0  . PREZISTA 800 MG tablet TAKE 1 TABLET BY MOUTH DAILY  30 tablet  6  . ritonavir (NORVIR) 100 MG TABS tablet Take 1 tablet (100 mg total) by mouth daily.  30 tablet  11  . simvastatin (ZOCOR) 20 MG tablet Take 1 tablet (20 mg total) by mouth at bedtime.  30  tablet  2  . TIVICAY 50 MG tablet TAKE 1 TABLET BY MOUTH DAILY  30 tablet  0  . traZODone (DESYREL) 50 MG tablet Take 50 mg by mouth at bedtime. Mental health      . valACYclovir (VALTREX) 500 MG tablet Take by mouth. Take 500 mg by mouth 2 (two) times daily as needed. For flare-ups.      . cyclobenzaprine (FLEXERIL) 10 MG tablet Take 1 tablet (10 mg total) by mouth 2 (two) times daily as needed for muscle spasms.  20 tablet  0  . dolutegravir (TIVICAY) 50 MG tablet Take 1 tablet (50 mg total) by mouth daily.  30 tablet  0  . oxyCODONE-acetaminophen (PERCOCET/ROXICET) 5-325 MG per tablet Take 1-2 tablets by mouth every 4 (four) hours as needed for severe pain.  20 tablet  0  . sildenafil (VIAGRA) 50 MG tablet Take 1 tablet (50 mg total) by mouth daily as needed for erectile dysfunction.  10 tablet  0   No current facility-administered medications on file prior to visit.   Family History  Problem Relation Age  of Onset  . Cancer Mother     laryngeal  . Pneumonia Father   . Diabetes Father   . Hypertension Sister   . Crohn's disease Brother   . Crohn's disease Maternal Grandmother   . Cancer Maternal Grandmother     patient unsure of type  . Colon cancer Maternal Grandfather    History   Social History  . Marital Status: Divorced    Spouse Name: N/A    Number of Children: 1  . Years of Education: N/A   Occupational History  . Disabled    Social History Main Topics  . Smoking status: Current Every Day Smoker -- 0.50 packs/day    Types: Cigarettes  . Smokeless tobacco: Never Used  . Alcohol Use: 1.0 oz/week    2 drink(s) per week     Comment: beer at times. not often  . Drug Use: 7.00 per week    Special: Marijuana  . Sexual Activity: Yes    Partners: Male     Comment: accepted condoms   Other Topics Concern  . Not on file   Social History Narrative  . No narrative on file    Review of Systems: See HPI   Objective:   Filed Vitals:   02/21/14 0933  BP: 90/50   Pulse: 70  Temp: 98.2 F (36.8 C)  Resp: 20   Physical Exam  HENT:  Right Ear: External ear normal.  Left Ear: External ear normal.  Lower gums black in color and swollen  Cardiovascular: Normal rate, regular rhythm and normal heart sounds.   Pulmonary/Chest: Effort normal and breath sounds normal.  Abdominal: Soft. Bowel sounds are normal.  Neurological: He is alert.     Lab Results  Component Value Date   WBC 8.2 11/16/2013   WBC 8.1 11/16/2013   HGB 16.3 11/16/2013   HGB 16.2 11/16/2013   HCT 45.5 11/16/2013   HCT 45.5 11/16/2013   MCV 88.9 11/16/2013   MCV 87.2 11/16/2013   PLT 186 11/16/2013   PLT 196 11/16/2013   Lab Results  Component Value Date   CREATININE 1.31 11/16/2013   CREATININE 1.41* 11/16/2013   BUN 12 11/16/2013   BUN 13 11/16/2013   NA 136 11/16/2013   NA 137 11/16/2013   K 4.0 11/16/2013   K 4.0 11/16/2013   CL 103 11/16/2013   CL 104 11/16/2013   CO2 26 11/16/2013   CO2 25 11/16/2013    Lab Results  Component Value Date   HGBA1C 5.7* 11/16/2013   Lipid Panel     Component Value Date/Time   CHOL 198 11/16/2013 1029   CHOL 190 11/16/2013 1029   TRIG 161* 11/16/2013 1029   TRIG 157* 11/16/2013 1029   HDL 33* 11/16/2013 1029   HDL 34* 11/16/2013 1029   CHOLHDL 6.0 11/16/2013 1029   CHOLHDL 5.6 11/16/2013 1029   VLDL 32 11/16/2013 1029   VLDL 31 11/16/2013 1029   LDLCALC 133* 11/16/2013 1029   LDLCALC 125* 11/16/2013 1029       Assessment and plan:   Jule was seen today for dental pain.  Diagnoses and associated orders for this visit:  Pain, dental - traMADol (ULTRAM) 50 MG tablet; Take 1 tablet (50 mg total) by mouth every 8 (eight) hours as needed. - amoxicillin (AMOXIL) 500 MG capsule; Take 1 capsule (500 mg total) by mouth 3 (three) times daily.  Tobacco use disorder Smoking cessation  Return if symptoms worsen or fail to improve.  Chari Manning, [facility] [phone] 02/24/2014, 10:00 AM

## 2014-02-21 NOTE — Progress Notes (Signed)
Complain of Dental Pain Lower area, Stated waiting for apt for Dentist

## 2014-02-21 NOTE — Patient Instructions (Signed)
Smoking Cessation Quitting smoking is important to your health and has many advantages. However, it is not always easy to quit since nicotine is a very addictive drug. Oftentimes, people try 3 times or more before being able to quit. This document explains the best ways for you to prepare to quit smoking. Quitting takes hard work and a lot of effort, but you can do it. ADVANTAGES OF QUITTING SMOKING  You will live longer, feel better, and live better.  Your body will feel the impact of quitting smoking almost immediately.  Within 20 minutes, blood pressure decreases. Your pulse returns to its normal level.  After 8 hours, carbon monoxide levels in the blood return to normal. Your oxygen level increases.  After 24 hours, the chance of having a heart attack starts to decrease. Your breath, hair, and body stop smelling like smoke.  After 48 hours, damaged nerve endings begin to recover. Your sense of taste and smell improve.  After 72 hours, the body is virtually free of nicotine. Your bronchial tubes relax and breathing becomes easier.  After 2 to 12 weeks, lungs can hold more air. Exercise becomes easier and circulation improves.  The risk of having a heart attack, stroke, cancer, or lung disease is greatly reduced.  After 1 year, the risk of coronary heart disease is cut in half.  After 5 years, the risk of stroke falls to the same as a nonsmoker.  After 10 years, the risk of lung cancer is cut in half and the risk of other cancers decreases significantly.  After 15 years, the risk of coronary heart disease drops, usually to the level of a nonsmoker.  If you are pregnant, quitting smoking will improve your chances of having a healthy baby.  The people you live with, especially any children, will be healthier.  You will have extra money to spend on things other than cigarettes. QUESTIONS TO THINK ABOUT BEFORE ATTEMPTING TO QUIT You may want to talk about your answers with your  health care provider.  Why do you want to quit?  If you tried to quit in the past, what helped and what did not?  What will be the most difficult situations for you after you quit? How will you plan to handle them?  Who can help you through the tough times? Your family? Friends? A health care provider?  What pleasures do you get from smoking? What ways can you still get pleasure if you quit? Here are some questions to ask your health care provider:  How can you help me to be successful at quitting?  What medicine do you think would be best for me and how should I take it?  What should I do if I need more help?  What is smoking withdrawal like? How can I get information on withdrawal? GET READY  Set a quit date.  Change your environment by getting rid of all cigarettes, ashtrays, matches, and lighters in your home, car, or work. Do not let people smoke in your home.  Review your past attempts to quit. Think about what worked and what did not. GET SUPPORT AND ENCOURAGEMENT You have a better chance of being successful if you have help. You can get support in many ways.  Tell your family, friends, and coworkers that you are going to quit and need their support. Ask them not to smoke around you.  Get individual, group, or telephone counseling and support. Programs are available at local hospitals and health centers. Call   your local health department for information about programs in your area.  Spiritual beliefs and practices may help some smokers quit.  Download a "quit meter" on your computer to keep track of quit statistics, such as how long you have gone without smoking, cigarettes not smoked, and money saved.  Get a self-help book about quitting smoking and staying off tobacco. LEARN NEW SKILLS AND BEHAVIORS  Distract yourself from urges to smoke. Talk to someone, go for a walk, or occupy your time with a task.  Change your normal routine. Take a different route to work.  Drink tea instead of coffee. Eat breakfast in a different place.  Reduce your stress. Take a hot bath, exercise, or read a book.  Plan something enjoyable to do every day. Reward yourself for not smoking.  Explore interactive web-based programs that specialize in helping you quit. GET MEDICINE AND USE IT CORRECTLY Medicines can help you stop smoking and decrease the urge to smoke. Combining medicine with the above behavioral methods and support can greatly increase your chances of successfully quitting smoking.  Nicotine replacement therapy helps deliver nicotine to your body without the negative effects and risks of smoking. Nicotine replacement therapy includes nicotine gum, lozenges, inhalers, nasal sprays, and skin patches. Some may be available over-the-counter and others require a prescription.  Antidepressant medicine helps people abstain from smoking, but how this works is unknown. This medicine is available by prescription.  Nicotinic receptor partial agonist medicine simulates the effect of nicotine in your brain. This medicine is available by prescription. Ask your health care provider for advice about which medicines to use and how to use them based on your health history. Your health care provider will tell you what side effects to look out for if you choose to be on a medicine or therapy. Carefully read the information on the package. Do not use any other product containing nicotine while using a nicotine replacement product.  RELAPSE OR DIFFICULT SITUATIONS Most relapses occur within the first 3 months after quitting. Do not be discouraged if you start smoking again. Remember, most people try several times before finally quitting. You may have symptoms of withdrawal because your body is used to nicotine. You may crave cigarettes, be irritable, feel very hungry, cough often, get headaches, or have difficulty concentrating. The withdrawal symptoms are only temporary. They are strongest  when you first quit, but they will go away within 10-14 days. To reduce the chances of relapse, try to:  Avoid drinking alcohol. Drinking lowers your chances of successfully quitting.  Reduce the amount of caffeine you consume. Once you quit smoking, the amount of caffeine in your body increases and can give you symptoms, such as a rapid heartbeat, sweating, and anxiety.  Avoid smokers because they can make you want to smoke.  Do not let weight gain distract you. Many smokers will gain weight when they quit, usually less than 10 pounds. Eat a healthy diet and stay active. You can always lose the weight gained after you quit.  Find ways to improve your mood other than smoking. FOR MORE INFORMATION  www.smokefree.gov  Document Released: 06/08/2001 Document Revised: 10/29/2013 Document Reviewed: 09/23/2011 Exit[employer] Patient Information 2015 [employer]. This information is not intended to replace advice given to you by your health care provider. Make sure you discuss any questions you have with your health care provider.  

## 2014-02-26 NOTE — Progress Notes (Signed)
Pt scheduled to see surgeon at [facility] 03/11/14. Pt to arrive there at 2:45pm for a 3pm appt. Pt to arrive at [facility]. Pt aware of appt.

## 2014-02-28 ENCOUNTER — Telehealth: Payer: Self-pay | Admitting: Internal Medicine

## 2014-02-28 NOTE — Telephone Encounter (Signed)
Rec'd from Dr. Michail Sermon forward 7 pages to Dr. Hilarie Fredrickson

## 2014-03-11 ENCOUNTER — Telehealth: Payer: Self-pay | Admitting: *Deleted

## 2014-03-11 NOTE — Telephone Encounter (Signed)
Requesting information re:  MRI, he is asking to be scheduled.  Pt was originally referred to [facility] for Neurology and Dermatology in 08/2013.  Pt with 08/30/13 [facility] ED visit for "seizure" where CT head suggested f/u with MRI. Possible referral for MRI began from ED visit 08/30/13.  Left message for the pt requesting he call his PCP at [facility] to discuss the "seizure" and referral for MRI.

## 2014-03-20 ENCOUNTER — Other Ambulatory Visit: Payer: Self-pay | Admitting: Internal Medicine

## 2014-03-21 ENCOUNTER — Other Ambulatory Visit (INDEPENDENT_AMBULATORY_CARE_PROVIDER_SITE_OTHER): Payer: Self-pay

## 2014-03-21 DIAGNOSIS — B20 Human immunodeficiency virus [HIV] disease: Secondary | ICD-10-CM

## 2014-03-22 LAB — COMPREHENSIVE METABOLIC PANEL
ALT: 26 U/L (ref 0–53)
AST: 24 U/L (ref 0–37)
Albumin: 4.3 g/dL (ref 3.5–5.2)
Alkaline Phosphatase: 100 U/L (ref 39–117)
BUN: 11 mg/dL (ref 6–23)
CALCIUM: 9.8 mg/dL (ref 8.4–10.5)
CHLORIDE: 103 meq/L (ref 96–112)
CO2: 30 meq/L (ref 19–32)
CREATININE: 1.43 mg/dL — AB (ref 0.50–1.35)
Glucose, Bld: 84 mg/dL (ref 70–99)
Potassium: 4.4 mEq/L (ref 3.5–5.3)
Sodium: 139 mEq/L (ref 135–145)
Total Bilirubin: 0.4 mg/dL (ref 0.2–1.2)
Total Protein: 7.4 g/dL (ref 6.0–8.3)

## 2014-03-22 LAB — CBC WITH DIFFERENTIAL/PLATELET
Basophils Absolute: 0 10*3/uL (ref 0.0–0.1)
Basophils Relative: 0 % (ref 0–1)
EOS ABS: 0.1 10*3/uL (ref 0.0–0.7)
Eosinophils Relative: 1 % (ref 0–5)
HCT: 44 % (ref 39.0–52.0)
HEMOGLOBIN: 15.2 g/dL (ref 13.0–17.0)
Lymphocytes Relative: 34 % (ref 12–46)
Lymphs Abs: 2.7 10*3/uL (ref 0.7–4.0)
MCH: 31 pg (ref 26.0–34.0)
MCHC: 34.5 g/dL (ref 30.0–36.0)
MCV: 89.8 fL (ref 78.0–100.0)
MONOS PCT: 7 % (ref 3–12)
Monocytes Absolute: 0.5 10*3/uL (ref 0.1–1.0)
Neutro Abs: 4.5 10*3/uL (ref 1.7–7.7)
Neutrophils Relative %: 58 % (ref 43–77)
Platelets: 189 10*3/uL (ref 150–400)
RBC: 4.9 MIL/uL (ref 4.22–5.81)
RDW: 14.5 % (ref 11.5–15.5)
WBC: 7.8 10*3/uL (ref 4.0–10.5)

## 2014-03-22 LAB — T-HELPER CELL (CD4) - (RCID CLINIC ONLY)
CD4 T CELL ABS: 810 /uL (ref 400–2700)
CD4 T CELL HELPER: 29 % — AB (ref 33–55)

## 2014-03-24 LAB — HIV-1 RNA QUANT-NO REFLEX-BLD: HIV-1 RNA Quant, Log: <1.3 {Log} (ref <1.30)

## 2014-03-27 ENCOUNTER — Encounter: Payer: Self-pay | Admitting: Internal Medicine

## 2014-04-04 ENCOUNTER — Encounter: Payer: Self-pay | Admitting: Internal Medicine

## 2014-04-04 ENCOUNTER — Ambulatory Visit (INDEPENDENT_AMBULATORY_CARE_PROVIDER_SITE_OTHER): Payer: Self-pay | Admitting: Internal Medicine

## 2014-04-04 VITALS — BP 113/74 | HR 73 | Temp 98.1°F | Wt 231.0 lb

## 2014-04-04 DIAGNOSIS — Z23 Encounter for immunization: Secondary | ICD-10-CM

## 2014-04-04 DIAGNOSIS — Z113 Encounter for screening for infections with a predominantly sexual mode of transmission: Secondary | ICD-10-CM

## 2014-04-04 DIAGNOSIS — B20 Human immunodeficiency virus [HIV] disease: Secondary | ICD-10-CM

## 2014-04-10 NOTE — Progress Notes (Signed)
  Subjective:    Patient ID: Shawn Brooks, male    DOB: March 28, 1976, 38 y.o.   MRN: [id number]  HPI He comes in for followup of his HIV. 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 He denies any missed doses.  He has gone to [facility] for surgical and gastroenterology care for his anal warts, chronic diarrhea and hemorrhoids. 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 No weight loss, no diarrhea.  Back pain being managed by sports medicine.     Review of Systems  Constitutional: Negative for fatigue and unexpected weight change.  HENT: Negative for sore throat and trouble swallowing.   Eyes: Negative for visual disturbance.  Respiratory: Negative for cough and shortness of breath.   Cardiovascular: Negative for chest pain.  Gastrointestinal: Negative for nausea, abdominal pain, diarrhea and anal bleeding.  Genitourinary: Negative for discharge and genital sores.  Musculoskeletal: Positive for back pain.  Neurological: Negative for dizziness and headaches.  Hematological: Negative for adenopathy.  Psychiatric/Behavioral: Negative for dysphoric mood.       Objective:   Physical Exam  Constitutional: He appears well-developed and well-nourished. No distress.  HENT:  Mouth/Throat: Oropharynx is clear and moist. No oropharyngeal exudate.  Cardiovascular: Normal rate, regular rhythm and normal heart sounds.  Exam reveals no gallop and no friction rub.   No murmur heard. Pulmonary/Chest: Effort normal and breath sounds normal. No respiratory distress. He has no wheezes. He has no rales.  Lymphadenopathy:    He has no cervical adenopathy.  Neurological: He is alert.  Skin: Skin is warm and dry. No rash noted.  Psychiatric: He has a normal mood and affect.          Assessment & Plan:

## 2014-04-10 NOTE — Assessment & Plan Note (Signed)
Doing great, rtc 4 months.

## 2014-04-17 ENCOUNTER — Other Ambulatory Visit: Payer: Self-pay | Admitting: Infectious Diseases

## 2014-04-17 DIAGNOSIS — B20 Human immunodeficiency virus [HIV] disease: Secondary | ICD-10-CM

## 2014-04-24 ENCOUNTER — Ambulatory Visit: Payer: Self-pay | Attending: Internal Medicine | Admitting: Internal Medicine

## 2014-04-24 ENCOUNTER — Encounter: Payer: Self-pay | Admitting: Internal Medicine

## 2014-04-24 VITALS — BP 107/70 | HR 74 | Temp 98.2°F | Resp 16 | Ht 69.0 in | Wt 233.0 lb

## 2014-04-24 DIAGNOSIS — F419 Anxiety disorder, unspecified: Secondary | ICD-10-CM | POA: Insufficient documentation

## 2014-04-24 DIAGNOSIS — F1290 Cannabis use, unspecified, uncomplicated: Secondary | ICD-10-CM | POA: Insufficient documentation

## 2014-04-24 DIAGNOSIS — E785 Hyperlipidemia, unspecified: Secondary | ICD-10-CM | POA: Insufficient documentation

## 2014-04-24 DIAGNOSIS — J029 Acute pharyngitis, unspecified: Secondary | ICD-10-CM | POA: Insufficient documentation

## 2014-04-24 DIAGNOSIS — Z79899 Other long term (current) drug therapy: Secondary | ICD-10-CM | POA: Insufficient documentation

## 2014-04-24 DIAGNOSIS — B20 Human immunodeficiency virus [HIV] disease: Secondary | ICD-10-CM | POA: Insufficient documentation

## 2014-04-24 DIAGNOSIS — F17210 Nicotine dependence, cigarettes, uncomplicated: Secondary | ICD-10-CM | POA: Insufficient documentation

## 2014-04-24 DIAGNOSIS — F329 Major depressive disorder, single episode, unspecified: Secondary | ICD-10-CM | POA: Insufficient documentation

## 2014-04-24 LAB — POCT RAPID STREP A (OFFICE): Rapid Strep A Screen: NEGATIVE

## 2014-04-24 MED ORDER — NYSTATIN 100000 UNIT/ML MT SUSP: 5.0000 mL | Freq: Four times a day (QID) | OROMUCOSAL | Status: DC

## 2014-04-24 NOTE — Progress Notes (Signed)
Pt is here today c/o throat pain. Pt is still a current smoker.

## 2014-04-24 NOTE — Patient Instructions (Signed)
Smoking Cessation Quitting smoking is important to your health and has many advantages. However, it is not always easy to quit since nicotine is a very addictive drug. Oftentimes, people try 3 times or more before being able to quit. This document explains the best ways for you to prepare to quit smoking. Quitting takes hard work and a lot of effort, but you can do it. ADVANTAGES OF QUITTING SMOKING  You will live longer, feel better, and live better.  Your body will feel the impact of quitting smoking almost immediately.  Within 20 minutes, blood pressure decreases. Your pulse returns to its normal level.  After 8 hours, carbon monoxide levels in the blood return to normal. Your oxygen level increases.  After 24 hours, the chance of having a heart attack starts to decrease. Your breath, hair, and body stop smelling like smoke.  After 48 hours, damaged nerve endings begin to recover. Your sense of taste and smell improve.  After 72 hours, the body is virtually free of nicotine. Your bronchial tubes relax and breathing becomes easier.  After 2 to 12 weeks, lungs can hold more air. Exercise becomes easier and circulation improves.  The risk of having a heart attack, stroke, cancer, or lung disease is greatly reduced.  After 1 year, the risk of coronary heart disease is cut in half.  After 5 years, the risk of stroke falls to the same as a nonsmoker.  After 10 years, the risk of lung cancer is cut in half and the risk of other cancers decreases significantly.  After 15 years, the risk of coronary heart disease drops, usually to the level of a nonsmoker.  If you are pregnant, quitting smoking will improve your chances of having a healthy baby.  The people you live with, especially any children, will be healthier.  You will have extra money to spend on things other than cigarettes. QUESTIONS TO THINK ABOUT BEFORE ATTEMPTING TO QUIT You may want to talk about your answers with your  health care provider.  Why do you want to quit?  If you tried to quit in the past, what helped and what did not?  What will be the most difficult situations for you after you quit? How will you plan to handle them?  Who can help you through the tough times? Your family? Friends? A health care provider?  What pleasures do you get from smoking? What ways can you still get pleasure if you quit? Here are some questions to ask your health care provider:  How can you help me to be successful at quitting?  What medicine do you think would be best for me and how should I take it?  What should I do if I need more help?  What is smoking withdrawal like? How can I get information on withdrawal? GET READY  Set a quit date.  Change your environment by getting rid of all cigarettes, ashtrays, matches, and lighters in your home, car, or work. Do not let people smoke in your home.  Review your past attempts to quit. Think about what worked and what did not. GET SUPPORT AND ENCOURAGEMENT You have a better chance of being successful if you have help. You can get support in many ways.  Tell your family, friends, and coworkers that you are going to quit and need their support. Ask them not to smoke around you.  Get individual, group, or telephone counseling and support. Programs are available at local hospitals and health centers. Call   your local health department for information about programs in your area.  Spiritual beliefs and practices may help some smokers quit.  Download a "quit meter" on your computer to keep track of quit statistics, such as how long you have gone without smoking, cigarettes not smoked, and money saved.  Get a self-help book about quitting smoking and staying off tobacco. LEARN NEW SKILLS AND BEHAVIORS  Distract yourself from urges to smoke. Talk to someone, go for a walk, or occupy your time with a task.  Change your normal routine. Take a different route to work.  Drink tea instead of coffee. Eat breakfast in a different place.  Reduce your stress. Take a hot bath, exercise, or read a book.  Plan something enjoyable to do every day. Reward yourself for not smoking.  Explore interactive web-based programs that specialize in helping you quit. GET MEDICINE AND USE IT CORRECTLY Medicines can help you stop smoking and decrease the urge to smoke. Combining medicine with the above behavioral methods and support can greatly increase your chances of successfully quitting smoking.  Nicotine replacement therapy helps deliver nicotine to your body without the negative effects and risks of smoking. Nicotine replacement therapy includes nicotine gum, lozenges, inhalers, nasal sprays, and skin patches. Some may be available over-the-counter and others require a prescription.  Antidepressant medicine helps people abstain from smoking, but how this works is unknown. This medicine is available by prescription.  Nicotinic receptor partial agonist medicine simulates the effect of nicotine in your brain. This medicine is available by prescription. Ask your health care provider for advice about which medicines to use and how to use them based on your health history. Your health care provider will tell you what side effects to look out for if you choose to be on a medicine or therapy. Carefully read the information on the package. Do not use any other product containing nicotine while using a nicotine replacement product.  RELAPSE OR DIFFICULT SITUATIONS Most relapses occur within the first 3 months after quitting. Do not be discouraged if you start smoking again. Remember, most people try several times before finally quitting. You may have symptoms of withdrawal because your body is used to nicotine. You may crave cigarettes, be irritable, feel very hungry, cough often, get headaches, or have difficulty concentrating. The withdrawal symptoms are only temporary. They are strongest  when you first quit, but they will go away within 10-14 days. To reduce the chances of relapse, try to:  Avoid drinking alcohol. Drinking lowers your chances of successfully quitting.  Reduce the amount of caffeine you consume. Once you quit smoking, the amount of caffeine in your body increases and can give you symptoms, such as a rapid heartbeat, sweating, and anxiety.  Avoid smokers because they can make you want to smoke.  Do not let weight gain distract you. Many smokers will gain weight when they quit, usually less than 10 pounds. Eat a healthy diet and stay active. You can always lose the weight gained after you quit.  Find ways to improve your mood other than smoking. FOR MORE INFORMATION  www.smokefree.gov  Document Released: 06/08/2001 Document Revised: 10/29/2013 Document Reviewed: 09/23/2011 Exit[employer] Patient Information 2015 [employer]. This information is not intended to replace advice given to you by your health care provider. Make sure you discuss any questions you have with your health care provider.  

## 2014-04-24 NOTE — Progress Notes (Signed)
Patient ID: Shawn Brooks, male   DOB: 21-Jul-1975, 38 y.o.   MRN: [id number]  CC: sore throat  HPI:  He presents today with a sore throat for the past 3 weeks.  He states that it is tender to touch his neck and is very painful with swallowing or sneezing.  He reports a burning sensation.  He denies rhinitis, cough, headache, fevers, chills, or swollen glands.  He does not remember having any sick contacts.  He reports that he was able to get his tooth taken care of with some mouthwash to help his gums, so he does not believe his tooth is the reason for his sore throat.   No Known Allergies Past Medical History  Diagnosis Date  . HIV infection   . Hyperlipidemia   . Blood dyscrasia     HIV  . Chronic back pain   . Depression   . Anxiety   . Headache(784.0)   . Vertigo   . DJD (degenerative joint disease)   . Anemia    Current Outpatient Prescriptions on File Prior to Visit  Medication Sig Dispense Refill  . acetaminophen (TYLENOL) 500 MG tablet Take 1 tablet (500 mg total) by mouth every 6 (six) hours as needed.  30 tablet  0  . emtricitabine-tenofovir (TRUVADA) 200-300 MG per tablet Take 1 tablet by mouth daily.  30 tablet  0  . escitalopram (LEXAPRO) 20 MG tablet Take 1 tablet (20 mg total) by mouth daily.  30 tablet  6  . gabapentin (NEURONTIN) 100 MG capsule Take 1 capsule (100 mg total) by mouth 3 (three) times daily.  90 capsule  1  . NORVIR 100 MG TABS tablet TAKE 1 TABLET BY MOUTH DAILY  30 tablet  6  . omeprazole (PRILOSEC) 20 MG capsule Take 1 mg by mouth at bedtime.      Marland Kitchen PREZISTA 800 MG tablet TAKE 1 TABLET BY MOUTH DAILY  30 tablet  6  . ritonavir (NORVIR) 100 MG TABS tablet Take 1 tablet (100 mg total) by mouth daily.  30 tablet  11  . TIVICAY 50 MG tablet TAKE 1 TABLET BY MOUTH DAILY  30 tablet  5  . traMADol (ULTRAM) 50 MG tablet Take 1 tablet (50 mg total) by mouth every 8 (eight) hours as needed.  30 tablet  0  . traZODone (DESYREL) 50 MG tablet Take 50 mg by mouth  at bedtime. Mental health      . valACYclovir (VALTREX) 500 MG tablet Take by mouth. Take 500 mg by mouth 2 (two) times daily as needed. For flare-ups.      . cyclobenzaprine (FLEXERIL) 10 MG tablet Take 1 tablet (10 mg total) by mouth 2 (two) times daily as needed for muscle spasms.  20 tablet  0  . hyoscyamine (LEVSIN/SL) 0.125 MG SL tablet Place 1 tablet (0.125 mg total) under the tongue every 4 (four) hours as needed.  60 tablet  11  . oxyCODONE-acetaminophen (PERCOCET/ROXICET) 5-325 MG per tablet Take 1-2 tablets by mouth every 4 (four) hours as needed for severe pain.  20 tablet  0  . sildenafil (VIAGRA) 50 MG tablet Take 1 tablet (50 mg total) by mouth daily as needed for erectile dysfunction.  10 tablet  0  . simvastatin (ZOCOR) 20 MG tablet Take 1 tablet (20 mg total) by mouth at bedtime.  30 tablet  2   No current facility-administered medications on file prior to visit.   Family History  Problem Relation Age of  Onset  . Cancer Mother     laryngeal  . Pneumonia Father   . Diabetes Father   . Hypertension Sister   . Crohn's disease Brother   . Crohn's disease Maternal Grandmother   . Cancer Maternal Grandmother     patient unsure of type  . Colon cancer Maternal Grandfather    History   Social History  . Marital Status: Divorced    Spouse Name: N/A    Number of Children: 1  . Years of Education: N/A   Occupational History  . Disabled    Social History Main Topics  . Smoking status: Current Every Day Smoker -- 0.50 packs/day    Types: Cigarettes  . Smokeless tobacco: Never Used  . Alcohol Use: 1.0 oz/week    2 drink(s) per week     Comment: beer at times. not often  . Drug Use: 7.00 per week    Special: Marijuana  . Sexual Activity: Yes    Partners: Male     Comment: accepted condoms   Other Topics Concern  . Not on file   Social History Narrative  . No narrative on file    Review of Systems: Constitutional: Negative for fever, chills, diaphoresis,  activity change, appetite change and fatigue. HENT: Negative for ear pain, nosebleeds, congestion, facial swelling, rhinorrhea, neck pain, neck stiffness and ear discharge. + sore throat  Eyes: Negative for pain, discharge, redness, itching and visual disturbance. Respiratory: Negative for cough, choking, chest tightness, shortness of breath, wheezing and stridor.  Cardiovascular: Negative for chest pain, palpitations and leg swelling. Gastrointestinal: Negative for abdominal distention. Genitourinary: Negative for dysuria, urgency, frequency, hematuria, flank pain, decreased urine volume, difficulty urinating and dyspareunia.  Musculoskeletal: Negative for back pain, joint swelling, arthralgias and gait problem. Neurological: Negative for dizziness, tremors, seizures, syncope, facial asymmetry, speech difficulty, weakness, light-headedness, numbness and headaches.  Hematological: Negative for adenopathy. Does not bruise/bleed easily. Psychiatric/Behavioral: Negative for hallucinations, behavioral problems, confusion, dysphoric mood, decreased concentration and agitation.    Objective:   Filed Vitals:   04/24/14 0927  BP: 107/70  Pulse: 74  Temp: 98.2 F (36.8 C)  Resp: 16    Physical Exam  HENT:  Right Ear: External ear normal.  Left Ear: External ear normal.  Nose: Nose normal.  Mouth/Throat: Oropharynx is clear and moist.  Without exudate   Eyes: Conjunctivae are normal. Pupils are equal, round, and reactive to light.  Neck: Normal range of motion. Neck supple.  Cardiovascular: Normal rate, regular rhythm and normal heart sounds.   Pulmonary/Chest: Effort normal and breath sounds normal. No respiratory distress.  Lymphadenopathy:    He has no cervical adenopathy.     Lab Results  Component Value Date   WBC 7.8 03/21/2014   HGB 15.2 03/21/2014   HCT 44.0 03/21/2014   MCV 89.8 03/21/2014   PLT 189 03/21/2014   Lab Results  Component Value Date   CREATININE 1.43*  03/21/2014   BUN 11 03/21/2014   NA 139 03/21/2014   K 4.4 03/21/2014   CL 103 03/21/2014   CO2 30 03/21/2014    Lab Results  Component Value Date   HGBA1C 5.7* 11/16/2013   Lipid Panel     Component Value Date/Time   CHOL 198 11/16/2013 1029   CHOL 190 11/16/2013 1029   TRIG 161* 11/16/2013 1029   TRIG 157* 11/16/2013 1029   HDL 33* 11/16/2013 1029   HDL 34* 11/16/2013 1029   CHOLHDL 6.0 11/16/2013 1029   CHOLHDL  5.6 11/16/2013 1029   VLDL 32 11/16/2013 1029   VLDL 31 11/16/2013 1029   LDLCALC 133* 11/16/2013 1029   LDLCALC 125* 11/16/2013 1029       Assessment and plan:   Raeshaun was seen today for follow-up.  Diagnoses and associated orders for this visit:  Sore throat - Rapid Strep A---negative - Throat culture Randell Loop) May be a result of thrush, will treat accordingly. Do not suspect it is bacterial at this time.  Patient will return in 10 days if no improvement of symptoms.  Human immunodeficiency virus (HIV) disease - nystatin (MYCOSTATIN) 100000 UNIT/ML suspension; Take 5 mLs (500,000 Units total) by mouth 4 (four) times daily. Swish in mouth for as possible and spit out   Return in about 1 week (around 05/01/2014) for Nurse Visit if no improvement.        Chari Manning, NP-C [facility] [phone] 04/24/2014, 9:50 AM

## 2014-04-26 LAB — CULTURE, GROUP A STREP: Organism ID, Bacteria: NORMAL

## 2014-05-01 ENCOUNTER — Telehealth: Payer: Self-pay | Admitting: Emergency Medicine

## 2014-05-01 NOTE — Telephone Encounter (Signed)
Pt given negative throat culture results. States sx's has resolved

## 2014-05-01 NOTE — Telephone Encounter (Signed)
-----   Message from Shawn Bosch, NP sent at 04/30/2014  9:30 PM EST ----- The patient know he had a negative throat culture. Find out if the nystatin swish and split has been helping his throat pain

## 2014-05-13 ENCOUNTER — Ambulatory Visit (AMBULATORY_SURGERY_CENTER): Payer: Self-pay | Admitting: *Deleted

## 2014-05-13 VITALS — Ht 69.0 in | Wt 234.0 lb

## 2014-05-13 DIAGNOSIS — Z86010 Personal history of colonic polyps: Secondary | ICD-10-CM

## 2014-05-13 MED ORDER — MOVIPREP 100 G PO SOLR: ORAL | Status: DC

## 2014-05-13 NOTE — Progress Notes (Signed)
Patient denies any allergies to eggs or soy. Patient denies any problems with anesthesia/sedation. Patient denies any oxygen use at home and does not take any diet/weight loss medications. EMMI education assisgned to patient on colonoscopy, this was explained and instructions given to patient. 

## 2014-05-27 ENCOUNTER — Ambulatory Visit (AMBULATORY_SURGERY_CENTER): Payer: Self-pay | Admitting: Internal Medicine

## 2014-05-27 ENCOUNTER — Encounter: Payer: Self-pay | Admitting: Internal Medicine

## 2014-05-27 ENCOUNTER — Other Ambulatory Visit: Payer: Self-pay | Admitting: Internal Medicine

## 2014-05-27 VITALS — BP 130/77 | HR 59 | Temp 96.7°F | Resp 19 | Ht 69.0 in | Wt 234.0 lb

## 2014-05-27 DIAGNOSIS — D125 Benign neoplasm of sigmoid colon: Secondary | ICD-10-CM

## 2014-05-27 DIAGNOSIS — Z86010 Personal history of colonic polyps: Secondary | ICD-10-CM

## 2014-05-27 DIAGNOSIS — D123 Benign neoplasm of transverse colon: Secondary | ICD-10-CM

## 2014-05-27 MED ORDER — SODIUM CHLORIDE 0.9 % IV SOLN: 500.0000 mL | INTRAVENOUS | Status: DC

## 2014-05-27 NOTE — Progress Notes (Signed)
Report to PACU, RN, vss, BBS= Clear.  

## 2014-05-27 NOTE — Op Note (Signed)
[place]  [address] [place], [address]   COLONOSCOPY PROCEDURE REPORT  PATIENT: Shawn Brooks, Shawn Brooks  MR#: [id number] BIRTHDATE: August 21, 1975 , 38  yrs. old GENDER: male ENDOSCOPIST: Jerene Bears, MD REFERRED BY: Chari Manning, NP PROCEDURE DATE:  05/27/2014 PROCEDURE:   Colonoscopy with cold biopsy polypectomy First Screening Colonoscopy - Avg.  risk and is 50 yrs.  old or older - No.  Prior Negative Screening - Now for repeat screening. N/A  History of Adenoma - Now for follow-up colonoscopy & has been > or = to 3 yrs.  Yes hx of adenoma.  Has been 3 or more years since last colonoscopy.  Polyps Removed Today? Yes. ASA CLASS:   Class III INDICATIONS:high risk personal history of colonic polyps.  history of anal condyloma and AIN [facility]) MEDICATIONS: Monitored anesthesia care and Propofol 300 mg IV  DESCRIPTION OF PROCEDURE:   After the risks benefits and alternatives of the procedure were thoroughly explained, informed consent was obtained.  The digital rectal exam revealed no rectal mass.   The LB CF-H180AL Loaner [id number]  endoscope was introduced through the anus and advanced to the cecum, which was identified by both the appendix and ileocecal valve. No adverse events experienced.   The quality of the prep was good, using MoviPrep The instrument was then slowly withdrawn as the colon was fully examined.   COLON FINDINGS: Three sessile polyps ranging between 3-42mm in size were found in the transverse colon (2) and sigmoid colon (1). Polypectomies were performed with cold forceps.  The resection was complete, the polyp tissue was completely retrieved and sent to histology.   The examination was otherwise normal.  Retroflexed views revealed small internal hemorrhoids. No condyloma seen. The time to cecum=5 minutes 06 seconds.  Withdrawal time=11 minutes 32 seconds.  The scope was withdrawn and the procedure completed. COMPLICATIONS: There were no immediate  complications.  ENDOSCOPIC IMPRESSION: 1.   Three sessile polyps ranging between 3-81mm in size were found in the transverse colon and sigmoid colon; polypectomies were performed with cold forceps 2.   The examination was otherwise normal  RECOMMENDATIONS: 1.  Await pathology results 2.  Timing of repeat colonoscopy will be determined by pathology findings. 3.  You will receive a letter within 1-2 weeks with the results of your biopsy as well as final recommendations.  Please call my office if you have not received a letter after 3 weeks. 4.  Referral to Dr. Johney Maine given history of AIN/condyloma and past recommendation for surgical follow-up  eSigned:  Jerene Bears, MD 05/27/2014 9:17 AM cc: The Patient; PCP

## 2014-05-27 NOTE — Progress Notes (Signed)
Called to room to assist during endoscopic procedure.  Patient ID and intended procedure confirmed with present staff. Received instructions for my participation in the procedure from the performing physician.  

## 2014-05-27 NOTE — Patient Instructions (Signed)
YOU HAD AN ENDOSCOPIC PROCEDURE TODAY AT THE [facility]: Refer to the procedure report that was given to you for any specific questions about what was found during the examination.  If the procedure report does not answer your questions, please call your gastroenterologist to clarify.  If you requested that your care partner not be given the details of your procedure findings, then the procedure report has been included in a sealed envelope for you to review at your convenience later.  YOU SHOULD EXPECT: Some feelings of bloating in the abdomen. Passage of more gas than usual.  Walking can help get rid of the air that was put into your GI tract during the procedure and reduce the bloating. If you had a lower endoscopy (such as a colonoscopy or flexible sigmoidoscopy) you may notice spotting of blood in your stool or on the toilet paper. If you underwent a bowel prep for your procedure, then you may not have a normal bowel movement for a few days.  DIET: Your first meal following the procedure should be a light meal and then it is ok to progress to your normal diet.  A half-sandwich or bowl of soup is an example of a good first meal.  Heavy or fried foods are harder to digest and may make you feel nauseous or bloated.  Likewise meals heavy in dairy and vegetables can cause extra gas to form and this can also increase the bloating.  Drink plenty of fluids but you should avoid alcoholic beverages for 24 hours.  ACTIVITY: Your care partner should take you home directly after the procedure.  You should plan to take it easy, moving slowly for the rest of the day.  You can resume normal activity the day after the procedure however you should NOT DRIVE or use heavy machinery for 24 hours (because of the sedation medicines used during the test).    SYMPTOMS TO REPORT IMMEDIATELY: A gastroenterologist can be reached at any hour.  During normal business hours, 8:30 AM to 5:00 PM Monday through Friday,  call [phone].  After hours and on weekends, please call the GI answering service at [phone] who will take a message and have the physician on call contact you.   Following lower endoscopy (colonoscopy or flexible sigmoidoscopy):  Excessive amounts of blood in the stool  Significant tenderness or worsening of abdominal pains  Swelling of the abdomen that is new, acute  Fever of 100F or higher  FOLLOW UP: If any biopsies were taken you will be contacted by phone or by letter within the next 1-3 weeks.  Call your gastroenterologist if you have not heard about the biopsies in 3 weeks.  Our staff will call the home number listed on your records the next business day following your procedure to check on you and address any questions or concerns that you may have at that time regarding the information given to you following your procedure. This is a courtesy call and so if there is no answer at the home number and we have not heard from you through the emergency physician on call, we will assume that you have returned to your regular daily activities without incident.  SIGNATURES/CONFIDENTIALITY: You and/or your care partner have signed paperwork which will be entered into your electronic medical record.  These signatures attest to the fact that that the information above on your After Visit Summary has been reviewed and is understood.  Full responsibility of the confidentiality of this   discharge information lies with you and/or your care-partner.  Polyp information given. Dr. Vena Rua office will get you a referral to Dr. Johney Maine.

## 2014-05-28 ENCOUNTER — Telehealth: Payer: Self-pay | Admitting: *Deleted

## 2014-05-28 NOTE — Telephone Encounter (Signed)
  Follow up Call-  No answer, left message to call if questions or concerns.   F

## 2014-06-03 ENCOUNTER — Encounter: Payer: Self-pay | Admitting: Internal Medicine

## 2014-06-12 ENCOUNTER — Encounter: Payer: Self-pay | Admitting: Internal Medicine

## 2014-06-12 ENCOUNTER — Ambulatory Visit: Payer: Self-pay | Admitting: Internal Medicine

## 2014-06-12 ENCOUNTER — Ambulatory Visit: Payer: Self-pay | Attending: Internal Medicine | Admitting: Internal Medicine

## 2014-06-12 VITALS — BP 115/78 | HR 75 | Temp 98.0°F | Resp 16

## 2014-06-12 DIAGNOSIS — E785 Hyperlipidemia, unspecified: Secondary | ICD-10-CM | POA: Insufficient documentation

## 2014-06-12 DIAGNOSIS — G8929 Other chronic pain: Secondary | ICD-10-CM | POA: Insufficient documentation

## 2014-06-12 DIAGNOSIS — M5440 Lumbago with sciatica, unspecified side: Secondary | ICD-10-CM

## 2014-06-12 DIAGNOSIS — B20 Human immunodeficiency virus [HIV] disease: Secondary | ICD-10-CM | POA: Insufficient documentation

## 2014-06-12 MED ORDER — TRAMADOL HCL 50 MG PO TABS: 50.0000 mg | ORAL_TABLET | Freq: Two times a day (BID) | ORAL | Status: DC | PRN

## 2014-06-12 MED ORDER — CYCLOBENZAPRINE HCL 10 MG PO TABS
10.0000 mg | ORAL_TABLET | Freq: Two times a day (BID) | ORAL | Status: DC | PRN
Start: 2014-06-12 — End: 2015-10-02

## 2014-06-12 MED ORDER — PREDNISONE 50 MG PO TABS: ORAL_TABLET | ORAL | Status: DC

## 2014-06-12 NOTE — Patient Instructions (Signed)
Sciatica Sciatica is pain, weakness, numbness, or tingling along the path of the sciatic nerve. The nerve starts in the lower back and runs down the back of each leg. The nerve controls the muscles in the lower leg and in the back of the knee, while also providing sensation to the back of the thigh, lower leg, and the sole of your foot. Sciatica is a symptom of another medical condition. For instance, nerve damage or certain conditions, such as a herniated disk or bone spur on the spine, pinch or put pressure on the sciatic nerve. This causes the pain, weakness, or other sensations normally associated with sciatica. Generally, sciatica only affects one side of the body. CAUSES   Herniated or slipped disc.  Degenerative disk disease.  A pain disorder involving the narrow muscle in the buttocks (piriformis syndrome).  Pelvic injury or fracture.  Pregnancy.  Tumor (rare). SYMPTOMS  Symptoms can vary from mild to very severe. The symptoms usually travel from the low back to the buttocks and down the back of the leg. Symptoms can include:  Mild tingling or dull aches in the lower back, leg, or hip.  Numbness in the back of the calf or sole of the foot.  Burning sensations in the lower back, leg, or hip.  Sharp pains in the lower back, leg, or hip.  Leg weakness.  Severe back pain inhibiting movement. These symptoms may get worse with coughing, sneezing, laughing, or prolonged sitting or standing. Also, being overweight may worsen symptoms. DIAGNOSIS  Your caregiver will perform a physical exam to look for common symptoms of sciatica. He or she may ask you to do certain movements or activities that would trigger sciatic nerve pain. Other tests may be performed to find the cause of the sciatica. These may include:  Blood tests.  X-rays.  Imaging tests, such as an MRI or CT scan. TREATMENT  Treatment is directed at the cause of the sciatic pain. Sometimes, treatment is not necessary  and the pain and discomfort goes away on its own. If treatment is needed, your caregiver may suggest:  Over-the-counter medicines to relieve pain.  Prescription medicines, such as anti-inflammatory medicine, muscle relaxants, or narcotics.  Applying heat or ice to the painful area.  Steroid injections to lessen pain, irritation, and inflammation around the nerve.  Reducing activity during periods of pain.  Exercising and stretching to strengthen your abdomen and improve flexibility of your spine. Your caregiver may suggest losing weight if the extra weight makes the back pain worse.  Physical therapy.  Surgery to eliminate what is pressing or pinching the nerve, such as a bone spur or part of a herniated disk. HOME CARE INSTRUCTIONS   Only take over-the-counter or prescription medicines for pain or discomfort as directed by your caregiver.  Apply ice to the affected area for 20 minutes, 3-4 times a day for the first 48-72 hours. Then try heat in the same way.  Exercise, stretch, or perform your usual activities if these do not aggravate your pain.  Attend physical therapy sessions as directed by your caregiver.  Keep all follow-up appointments as directed by your caregiver.  Do not wear high heels or shoes that do not provide proper support.  Check your mattress to see if it is too soft. A firm mattress may lessen your pain and discomfort. SEEK IMMEDIATE MEDICAL CARE IF:   You lose control of your bowel or bladder (incontinence).  You have increasing weakness in the lower back, pelvis, buttocks,   or legs.  You have redness or swelling of your back.  You have a burning sensation when you urinate.  You have pain that gets worse when you lie down or awakens you at night.  Your pain is worse than you have experienced in the past.  Your pain is lasting longer than 4 weeks.  You are suddenly losing weight without reason. MAKE SURE YOU:  Understand these  instructions.  Will watch your condition.  Will get help right away if you are not doing well or get worse. Document Released: 06/08/2001 Document Revised: 12/14/2011 Document Reviewed: 10/24/2011 Exit[employer] Patient Information 2015 [employer]. This information is not intended to replace advice given to you by your health care provider. Make sure you discuss any questions you have with your health care provider.  

## 2014-06-12 NOTE — Progress Notes (Signed)
Patient ID: Shawn Brooks, male   DOB: 1976/04/03, 38 y.o.   MRN: [id number]  CC: back pain  HPI: Shawn Brooks is a 38 y.o. male here today for a follow up visit.  Patient has past medical history of HIV, HLD, and chronic back pain.  Patient reports that he has been having lower back pain for several days.  He has been taking OTC pain aids without relief. He reports that pain is aching in nature and begins in his lower back with radiation down his left leg.  He reports that he occasionally has spasms that affect his sleeping at night.  He denies bowel or bladder dysfunction.   Patient has No headache, No chest pain, No abdominal pain - No Nausea, No new weakness tingling or numbness, No Cough - SOB.  No Known Allergies Past Medical History  Diagnosis Date  . HIV infection   . Hyperlipidemia   . Blood dyscrasia     HIV  . Chronic back pain   . Depression   . Anxiety   . Headache(784.0)   . Vertigo   . DJD (degenerative joint disease)   . Anemia    Current Outpatient Prescriptions on File Prior to Visit  Medication Sig Dispense Refill  . acetaminophen (TYLENOL) 500 MG tablet Take 1 tablet (500 mg total) by mouth every 6 (six) hours as needed. 30 tablet 0  . ARIPiprazole (ABILIFY) 5 MG tablet Take 5 mg by mouth daily.    Marland Kitchen emtricitabine-tenofovir (TRUVADA) 200-300 MG per tablet Take 1 tablet by mouth daily. 30 tablet 0  . escitalopram (LEXAPRO) 20 MG tablet Take 1 tablet (20 mg total) by mouth daily. 30 tablet 6  . gabapentin (NEURONTIN) 100 MG capsule Take 1 capsule (100 mg total) by mouth 3 (three) times daily. 90 capsule 1  . naproxen (NAPROSYN) 500 MG tablet Take 500 mg by mouth daily.    . NORVIR 100 MG TABS tablet TAKE 1 TABLET BY MOUTH DAILY 30 tablet 6  . nystatin (MYCOSTATIN) 100000 UNIT/ML suspension Take 5 mLs (500,000 Units total) by mouth 4 (four) times daily. Swish in mouth for as possible and spit out 60 mL 1  . omeprazole (PRILOSEC) 20 MG capsule Take 1 mg by mouth at  bedtime.    Marland Kitchen PREZISTA 800 MG tablet TAKE 1 TABLET BY MOUTH DAILY 30 tablet 6  . simvastatin (ZOCOR) 20 MG tablet Take 1 tablet (20 mg total) by mouth at bedtime. 30 tablet 2  . TIVICAY 50 MG tablet TAKE 1 TABLET BY MOUTH DAILY 30 tablet 5  . traZODone (DESYREL) 50 MG tablet Take 50 mg by mouth at bedtime. Mental health    . valACYclovir (VALTREX) 500 MG tablet Take by mouth. Take 500 mg by mouth 2 (two) times daily as needed. For flare-ups.    . cyclobenzaprine (FLEXERIL) 10 MG tablet Take 1 tablet (10 mg total) by mouth 2 (two) times daily as needed for muscle spasms. (Patient not taking: Reported on 05/27/2014) 20 tablet 0  . hyoscyamine (LEVSIN/SL) 0.125 MG SL tablet Place 1 tablet (0.125 mg total) under the tongue every 4 (four) hours as needed. (Patient not taking: Reported on 05/27/2014) 60 tablet 11  . omeprazole (PRILOSEC) 20 MG capsule TAKE 1 CAPSULE BY MOUTH EVERY DAY (Patient not taking: Reported on 06/12/2014) 30 capsule 3  . oxyCODONE-acetaminophen (PERCOCET/ROXICET) 5-325 MG per tablet Take 1-2 tablets by mouth every 4 (four) hours as needed for severe pain. (Patient not taking: Reported on 05/27/2014)  20 tablet 0  . sildenafil (VIAGRA) 50 MG tablet Take 1 tablet (50 mg total) by mouth daily as needed for erectile dysfunction. (Patient not taking: Reported on 06/12/2014) 10 tablet 0  . traMADol (ULTRAM) 50 MG tablet Take 1 tablet (50 mg total) by mouth every 8 (eight) hours as needed. (Patient not taking: Reported on 05/27/2014) 30 tablet 0   No current facility-administered medications on file prior to visit.   Family History  Problem Relation Age of Onset  . Cancer Mother     laryngeal  . Pneumonia Father   . Diabetes Father   . Hypertension Sister   . Crohn's disease Brother   . Crohn's disease Maternal Grandmother   . Cancer Maternal Grandmother     patient unsure of type  . Colon cancer Maternal Grandfather    History   Social History  . Marital Status: Divorced     Spouse Name: N/A    Number of Children: 1  . Years of Education: N/A   Occupational History  . Disabled    Social History Main Topics  . Smoking status: Current Every Day Smoker -- 0.50 packs/day    Types: Cigarettes  . Smokeless tobacco: Never Used  . Alcohol Use: 1.2 oz/week    2 Not specified per week     Comment: beer at times. not often  . Drug Use: 7.00 per week    Special: Marijuana     Comment: USED MARJUANA LAST NIGHT.  Marland Kitchen Sexual Activity:    Partners: Male     Comment: accepted condoms   Other Topics Concern  . Not on file   Social History Narrative    Review of Systems: See HPI   Objective:   Filed Vitals:   06/12/14 1749  BP: 115/78  Pulse: 75  Temp: 98 F (36.7 C)  Resp: 16    Physical Exam  Constitutional: He is oriented to person, place, and time.  Cardiovascular: Normal rate, regular rhythm and normal heart sounds.   Pulmonary/Chest: Effort normal and breath sounds normal.  Musculoskeletal: Normal range of motion. He exhibits tenderness (lumbar spine').  Neurological: He is alert and oriented to person, place, and time.  Skin: Skin is warm and dry.     Lab Results  Component Value Date   WBC 7.8 03/21/2014   HGB 15.2 03/21/2014   HCT 44.0 03/21/2014   MCV 89.8 03/21/2014   PLT 189 03/21/2014   Lab Results  Component Value Date   CREATININE 1.43* 03/21/2014   BUN 11 03/21/2014   NA 139 03/21/2014   K 4.4 03/21/2014   CL 103 03/21/2014   CO2 30 03/21/2014    Lab Results  Component Value Date   HGBA1C 5.7* 11/16/2013   Lipid Panel     Component Value Date/Time   CHOL 198 11/16/2013 1029   CHOL 190 11/16/2013 1029   TRIG 161* 11/16/2013 1029   TRIG 157* 11/16/2013 1029   HDL 33* 11/16/2013 1029   HDL 34* 11/16/2013 1029   CHOLHDL 6.0 11/16/2013 1029   CHOLHDL 5.6 11/16/2013 1029   VLDL 32 11/16/2013 1029   VLDL 31 11/16/2013 1029   LDLCALC 133* 11/16/2013 1029   LDLCALC 125* 11/16/2013 1029       Assessment and  plan:   Shawn Brooks was seen today for follow-up.  Diagnoses and associated orders for this visit:  Midline low back pain with sciatica, sciatica laterality unspecified - predniSONE (DELTASONE) 50 MG tablet; Take one pill daily for  5 days - traMADol (ULTRAM) 50 MG tablet; Take 1 tablet (50 mg total) by mouth every 12 (twelve) hours as needed. - cyclobenzaprine (FLEXERIL) 10 MG tablet; Take 1 tablet (10 mg total) by mouth 2 (two) times daily as needed for muscle spasms. Explained signs and symptoms that should warrant immediate attention.  Patient verbalized understanding with teach back used.    Return if symptoms worsen or fail to improve.       Chari Manning, [place] [facility] [phone] 06/12/2014, 5:54 PM

## 2014-06-12 NOTE — Progress Notes (Signed)
Pt is here following up on his chronic lower back pain and hip pain.

## 2014-06-20 ENCOUNTER — Other Ambulatory Visit: Payer: Self-pay | Admitting: Internal Medicine

## 2014-06-25 ENCOUNTER — Other Ambulatory Visit: Payer: Self-pay | Admitting: Internal Medicine

## 2014-06-25 DIAGNOSIS — B20 Human immunodeficiency virus [HIV] disease: Secondary | ICD-10-CM

## 2014-07-08 ENCOUNTER — Encounter: Payer: Self-pay | Admitting: Internal Medicine

## 2014-07-20 ENCOUNTER — Other Ambulatory Visit: Payer: Self-pay | Admitting: Internal Medicine

## 2014-07-30 ENCOUNTER — Ambulatory Visit: Payer: Self-pay

## 2014-07-30 ENCOUNTER — Telehealth: Payer: Self-pay

## 2014-07-30 ENCOUNTER — Other Ambulatory Visit (INDEPENDENT_AMBULATORY_CARE_PROVIDER_SITE_OTHER): Payer: Self-pay

## 2014-07-30 DIAGNOSIS — Z113 Encounter for screening for infections with a predominantly sexual mode of transmission: Secondary | ICD-10-CM

## 2014-07-30 DIAGNOSIS — B20 Human immunodeficiency virus [HIV] disease: Secondary | ICD-10-CM

## 2014-07-30 LAB — CBC WITH DIFFERENTIAL/PLATELET
Basophils Absolute: 0 10*3/uL (ref 0.0–0.1)
Basophils Relative: 0 % (ref 0–1)
Eosinophils Absolute: 0.1 10*3/uL (ref 0.0–0.7)
Eosinophils Relative: 1 % (ref 0–5)
HCT: 44.1 % (ref 39.0–52.0)
HEMOGLOBIN: 15.5 g/dL (ref 13.0–17.0)
Lymphocytes Relative: 38 % (ref 12–46)
Lymphs Abs: 3.2 10*3/uL (ref 0.7–4.0)
MCH: 30.6 pg (ref 26.0–34.0)
MCHC: 35.1 g/dL (ref 30.0–36.0)
MCV: 87 fL (ref 78.0–100.0)
MPV: 10.4 fL (ref 8.6–12.4)
Monocytes Absolute: 0.8 10*3/uL (ref 0.1–1.0)
Monocytes Relative: 9 % (ref 3–12)
NEUTROS PCT: 52 % (ref 43–77)
Neutro Abs: 4.4 10*3/uL (ref 1.7–7.7)
PLATELETS: 200 10*3/uL (ref 150–400)
RBC: 5.07 MIL/uL (ref 4.22–5.81)
RDW: 14.4 % (ref 11.5–15.5)
WBC: 8.5 10*3/uL (ref 4.0–10.5)

## 2014-07-30 LAB — COMPLETE METABOLIC PANEL WITH GFR
ALT: 22 U/L (ref 0–53)
AST: 21 U/L (ref 0–37)
Albumin: 3.9 g/dL (ref 3.5–5.2)
Alkaline Phosphatase: 81 U/L (ref 39–117)
BUN: 10 mg/dL (ref 6–23)
CO2: 27 mEq/L (ref 19–32)
Calcium: 9.2 mg/dL (ref 8.4–10.5)
Chloride: 104 mEq/L (ref 96–112)
Creat: 1.34 mg/dL (ref 0.50–1.35)
GFR, EST AFRICAN AMERICAN: 76 mL/min
GFR, Est Non African American: 65 mL/min
GLUCOSE: 87 mg/dL (ref 70–99)
POTASSIUM: 4 meq/L (ref 3.5–5.3)
SODIUM: 138 meq/L (ref 135–145)
Total Bilirubin: 0.3 mg/dL (ref 0.2–1.2)
Total Protein: 7.1 g/dL (ref 6.0–8.3)

## 2014-07-30 NOTE — Telephone Encounter (Signed)
-----   Message from Maury Dus, RN sent at 05/27/2014  4:46 PM EST ----- Regarding: Dr. Cordella Register in FEB-Orange Card Call [facility] for appt in FEB with Dr. Johney Maine for anal condyloma

## 2014-07-30 NOTE — Telephone Encounter (Signed)
Called to give referral to [facility] for an appt with Dr. Johney Maine for anal condyloma due to pt having an orange card. Per [facility] pts primary provider listed on card must make referral. Message sent to Dr. Linus Salmons with [facility] for him to make referral.

## 2014-07-31 ENCOUNTER — Other Ambulatory Visit: Payer: Self-pay | Admitting: *Deleted

## 2014-07-31 ENCOUNTER — Telehealth: Payer: Self-pay | Admitting: *Deleted

## 2014-07-31 DIAGNOSIS — B20 Human immunodeficiency virus [HIV] disease: Secondary | ICD-10-CM

## 2014-07-31 LAB — RPR

## 2014-07-31 LAB — HIV-1 RNA QUANT-NO REFLEX-BLD
HIV 1 RNA Quant: 170 copies/mL — ABNORMAL HIGH (ref <20)
HIV-1 RNA Quant, Log: 2.23 {Log} — ABNORMAL HIGH (ref <1.30)

## 2014-07-31 LAB — T-HELPER CELL (CD4) - (RCID CLINIC ONLY)
CD4 % Helper T Cell: 30 % — ABNORMAL LOW (ref 33–55)
CD4 T Cell Abs: 1060 /uL (ref 400–2700)

## 2014-07-31 MED ORDER — DARUNAVIR ETHANOLATE 800 MG PO TABS: 800.0000 mg | ORAL_TABLET | Freq: Every day | ORAL | Status: DC

## 2014-07-31 MED ORDER — RITONAVIR 100 MG PO TABS: 100.0000 mg | ORAL_TABLET | Freq: Every day | ORAL | Status: DC

## 2014-07-31 MED ORDER — DOLUTEGRAVIR SODIUM 50 MG PO TABS: 50.0000 mg | ORAL_TABLET | Freq: Every day | ORAL | Status: DC

## 2014-07-31 MED ORDER — EMTRICITABINE-TENOFOVIR DF 200-300 MG PO TABS: 1.0000 | ORAL_TABLET | Freq: Every day | ORAL | Status: DC

## 2014-07-31 NOTE — Telephone Encounter (Signed)
ADAP Application 

## 2014-07-31 NOTE — Telephone Encounter (Signed)
Requested pt obtain updated "orange" card for [facility] referral.  Pt to follow through and bring card to [facility] for scanning and new referral to [facility], Dr. Johney Maine.

## 2014-08-13 ENCOUNTER — Encounter: Payer: Self-pay | Admitting: Internal Medicine

## 2014-08-13 ENCOUNTER — Ambulatory Visit (INDEPENDENT_AMBULATORY_CARE_PROVIDER_SITE_OTHER): Payer: Self-pay | Admitting: Internal Medicine

## 2014-08-13 VITALS — BP 126/78 | HR 78 | Temp 98.3°F | Ht 68.0 in | Wt 238.0 lb

## 2014-08-13 DIAGNOSIS — D013 Carcinoma in situ of anus and anal canal: Secondary | ICD-10-CM

## 2014-08-13 DIAGNOSIS — Z21 Asymptomatic human immunodeficiency virus [HIV] infection status: Secondary | ICD-10-CM

## 2014-08-13 DIAGNOSIS — K6282 Dysplasia of anus: Secondary | ICD-10-CM

## 2014-08-13 DIAGNOSIS — B20 Human immunodeficiency virus [HIV] disease: Secondary | ICD-10-CM

## 2014-08-13 NOTE — Progress Notes (Signed)
Subjective:    Patient ID: Shawn Brooks, male    DOB: 04-Apr-1976, 39 y.o.   MRN: [id number]  HPI He comes in for followup of his HIV. He is on Prezista, Norvir and Truvada and I added Tivicay last year with low level persistent viremia. He denies any missed doses and finally was undetectable last visit but now up to 170 again.  He has gone to [place] for surgical and gastroenterology care for his anal warts, chronic diarrhea and hemorrhoids. 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  Now trying to get back to [facility] here due to transportation.  No weight loss, no diarrhea.   Has a PCP.   Also with history of AIN.    History by surgery at [facility]: HISTORY OF PRESENT ILLNESS: Shawn Brooks is a 39 year old, African-American man who returns today for a preoperative work up prior to examination under anesthesia with possible excision of anal intraepithelial neoplasm and colonoscopy. 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 Since that time, he has continued to have intermittent dark red and bright red blood per rectum. He presented to our Emergency Department on December 29, 2012 with some swelling adjacent to his rectum and was found to have a small perianal abscess at that time. This was incised and drained and he has since recovered well from this. He did note about one day ago some increased pain at that site but has not noticed any fluctuance. He further denies any fevers, chills, coughs, colds, nausea, or vomiting. Since this incision and drainage, he has noted some blood in his stools on two separate occasions but nothing consistently. He has some occasional incontinence of stool in small amounts onto a pad in his underwear. Taken 01/23/13 for exam and then 03/16/13.  OP  report 03/16/13 from Care Everywhere:  PREOPERATIVE DIAGNOSIS: 1. Bright red blood per rectum. 2. AIN.  POSTOPERATIVE DIAGNOSES: 1. Anal condyloma. 2. Colonic polyp at the hepatic flexure and sigmoid colon. 3. History of bright red blood per rectum  PROCEDURE: 1. Colonoscopy with snare, polypectomy times two. 2. Examination under anesthesia. 3. Fulguration of anal condylomata.  SURGEONS: 1. Cheryll Cockayne, M.D. 2. Nathaneil Canary C. Megan Salon, M.D. 3. Murray Hodgkins, M.D.  ANESTHESIA: MAC for colonoscopy and snare polypectomy. General endotracheal anesthesia for the endoscopic examination under ultrasound condyloma fulguration.  ESTIMATED BLOOD LOSS: Minimum.  INDICATION FOR PROCEDURE: Mr. Shawn Brooks is a 39 year old African-American male with a history of HIV who has been followed by the [facility]. The patient was originally taken to the operating room on 11/14/12 for rectal abscess. During an incision and drainage he was also found to have condylomatous acuminatum within the anal canal and moderate squamous epithelial dysplasia. He also had incision and drainage on 12/29/2012 for additional perianal abscess. He has a past history of bright red blood per rectum. Secondary to this constellation of symptoms, it was decided that the patient would be best served with a colonoscopy to evaluate the entire colon as well as an examination under anesthesia with anoscopy to reassess for condyloma acuminatum disease.  FINDINGS DURING PROCEDURE: The patient had a good bowel prep prior to his colonoscopy. All of his colon from cecum to his rectum was able to be visualized. He had two small  sessile polyps approximately 2 mm to 3 mm in size, one at the hepatic flexure and one in the sigmoid colon. He in addition to this had some very mild condyloma burn at his left lateral area of his anal canal.  DESCRIPTION OF PROCEDURE: After Mr. Shawn Brooks was brought to the operating room and was  sedated by the Anesthesia team on hospital stretcher. He was then placed in the left lateral decubitus, once in the left lateral decubitus and properly sedated, a prep verification time out was performed via [facility] protocol. After the time out was performed, a digital rectal exam was performed on the patient, no acute findings were palpated on examination, after which time a colonoscope was then lubed and introduced into the patient's anus. Once in, the rectum was immediately visualized and then colonoscope was then advanced with relative ease through the patient's colon from the sigmoid colon to the cecum. It was appreciated that we have reached the cecum by visualization of the confluence of the tenia, a light reflex in the right lower quadrant as well as visualization of the ileocecal valve. Once at the ileocecal valve the colonoscope was slowly retracted through the patient's colon. The patient was appreciated with a small sessile polyp approximately 2 mm in size just distal to the hepatic flexure and a hot snare was used to entrap this lesion; however, no specimen was able to be obtained for pathology. After, was done, the mucosa was appreciated and there appeared to be hemostasis. Once this was done, the colonoscope was then further retracted through the transverse colon without any gross abnormalities found through the descending colon without any gross abnormalities found. Once in the sigmoid colon; however, there was an additional 2 mm sessile polyp appreciated, once again a hot snare was used to obliterate the lesion. A sample of this lesion was not able to be obtained for pathology. Once this was performed, the colonoscope was then retracted further from this patient's sigmoid colon into the patient's rectum and once in the rectum the colonoscope was retroflexed with visualization of the internal sphincter. There was some mild condylomatous disease appreciated there with a  small size internal hemorrhoids. The total transit time from the ileocecal valve back to the anus was 15 minutes. Once this was concluded this completed this portion of the procedure.  After the colonoscopy was complete, the patient was then placed back in the supine position and then general anesthesia was induced and the patient was then moved from the hospital stretcher to the bed and placed in the jackknife prone position. Once in the jackknife prone position another prep verification time out was performed via [facility] protocol. Once that was performed, the patient's posterior perianal region and bilateral buttocks were prepped with Betadine prep. The patient's buttocks were then retracted with paper tape. Once adequately prepped and draped the patient received an addition digital rectal exam, no pathological findings were palpated, and anoscope was then placed into the patient's rectum and then visualization of the patient's anal canal was performed circumferentially. There were some very mild dysplasia, possible condylomas appreciated in the left lateral area and this was fulgurated with electrocautery. Once this was done, the patient's anal canal was appreciated to be hemostatic. The anoscope was retracted from the patient's anus and this concluded the procedure. The patient was then placed back in the supine position and placed back on his hospital stretcher bed and awakened from anesthesia without incident.   Review of  Systems  Constitutional: Negative for fatigue and unexpected weight change.  HENT: Negative for sore throat and trouble swallowing.   Eyes: Negative for visual disturbance.  Respiratory: Negative for cough and shortness of breath.   Cardiovascular: Negative for chest pain.  Gastrointestinal: Negative for nausea, abdominal pain, diarrhea and anal bleeding.  Genitourinary: Negative for discharge and genital sores.  Musculoskeletal: Positive for back pain.   Neurological: Negative for dizziness and headaches.  Hematological: Negative for adenopathy.  Psychiatric/Behavioral: Negative for dysphoric mood.       Objective:   Physical Exam  Constitutional: He appears well-developed and well-nourished. No distress.  HENT:  Mouth/Throat: Oropharynx is clear and moist. No oropharyngeal exudate.  Eyes: No scleral icterus.  Cardiovascular: Normal rate, regular rhythm and normal heart sounds.  Exam reveals no gallop and no friction rub.   No murmur heard. Pulmonary/Chest: Effort normal and breath sounds normal. No respiratory distress. He has no wheezes. He has no rales.  Lymphadenopathy:    He has no cervical adenopathy.  Skin: Skin is warm and dry. No rash noted.          Assessment & Plan:

## 2014-08-13 NOTE — Assessment & Plan Note (Addendum)
[facility] to [facility] but, according to him is unable to return with the [facility] card and at his last visit there, he was turned away, and therefore will be referred to our general surgeons here.    History from care everywhere noted and copied into the history of present illness.

## 2014-08-13 NOTE — Assessment & Plan Note (Signed)
Had a little of detectable virus again and therefore we'll check again in 3 months.

## 2014-08-19 NOTE — Telephone Encounter (Addendum)
Patient brought updated, current orange card.  Referral sent 08/14/14 to [facility] for evaluation/follow up with Dr. Johney Maine.  Landis Gandy, RN

## 2014-08-26 ENCOUNTER — Telehealth: Payer: Self-pay | Admitting: *Deleted

## 2014-08-26 NOTE — Telephone Encounter (Addendum)
Patient has appointment with [name] (Dr. Johney Maine) on 09/05/14 at 4:00 pm and has been notified of date, time, and location through [facility]. Shawn Brooks

## 2014-09-16 ENCOUNTER — Other Ambulatory Visit: Payer: Self-pay | Admitting: Internal Medicine

## 2014-09-16 DIAGNOSIS — K219 Gastro-esophageal reflux disease without esophagitis: Secondary | ICD-10-CM

## 2014-09-18 ENCOUNTER — Other Ambulatory Visit: Payer: Self-pay

## 2014-09-18 DIAGNOSIS — B029 Zoster without complications: Secondary | ICD-10-CM

## 2014-09-18 MED ORDER — VALACYCLOVIR HCL 500 MG PO TABS: 500.0000 mg | ORAL_TABLET | Freq: Two times a day (BID) | ORAL | Status: DC

## 2014-10-08 ENCOUNTER — Other Ambulatory Visit: Payer: Self-pay

## 2014-10-08 DIAGNOSIS — B029 Zoster without complications: Secondary | ICD-10-CM

## 2014-10-08 MED ORDER — VALACYCLOVIR HCL 500 MG PO TABS: 500.0000 mg | ORAL_TABLET | Freq: Two times a day (BID) | ORAL | Status: DC

## 2014-10-17 ENCOUNTER — Other Ambulatory Visit: Payer: Self-pay | Admitting: Internal Medicine

## 2014-10-29 ENCOUNTER — Other Ambulatory Visit (INDEPENDENT_AMBULATORY_CARE_PROVIDER_SITE_OTHER): Payer: Self-pay

## 2014-10-29 DIAGNOSIS — Z79899 Other long term (current) drug therapy: Secondary | ICD-10-CM

## 2014-10-29 DIAGNOSIS — B20 Human immunodeficiency virus [HIV] disease: Secondary | ICD-10-CM

## 2014-10-29 DIAGNOSIS — Z113 Encounter for screening for infections with a predominantly sexual mode of transmission: Secondary | ICD-10-CM

## 2014-10-29 LAB — LIPID PANEL
CHOLESTEROL: 237 mg/dL — AB (ref 0–200)
HDL: 38 mg/dL — ABNORMAL LOW (ref >=40)
LDL CALC: 173 mg/dL — AB (ref 0–99)
TRIGLYCERIDES: 129 mg/dL (ref <150)
Total CHOL/HDL Ratio: 6.2 Ratio
VLDL: 26 mg/dL (ref 0–40)

## 2014-10-30 LAB — T-HELPER CELL (CD4) - (RCID CLINIC ONLY)
CD4 % Helper T Cell: 29 % — ABNORMAL LOW (ref 33–55)
CD4 T Cell Abs: 1180 /uL (ref 400–2700)

## 2014-10-30 LAB — HIV-1 RNA QUANT-NO REFLEX-BLD
HIV 1 RNA Quant: 35 copies/mL — ABNORMAL HIGH (ref <20)
HIV-1 RNA Quant, Log: 1.54 {Log} — ABNORMAL HIGH (ref <1.30)

## 2014-11-12 ENCOUNTER — Ambulatory Visit (INDEPENDENT_AMBULATORY_CARE_PROVIDER_SITE_OTHER): Payer: Self-pay | Admitting: Internal Medicine

## 2014-11-12 ENCOUNTER — Encounter: Payer: Self-pay | Admitting: Internal Medicine

## 2014-11-12 VITALS — BP 122/78 | HR 73 | Temp 97.5°F | Ht 69.0 in | Wt 235.0 lb

## 2014-11-12 DIAGNOSIS — Z21 Asymptomatic human immunodeficiency virus [HIV] infection status: Secondary | ICD-10-CM

## 2014-11-12 DIAGNOSIS — B20 Human immunodeficiency virus [HIV] disease: Secondary | ICD-10-CM

## 2014-11-12 MED ORDER — DARUNAVIR ETHANOLATE 800 MG PO TABS: 800.0000 mg | ORAL_TABLET | Freq: Every day | ORAL | Status: DC

## 2014-11-12 MED ORDER — ELVITEG-COBIC-EMTRICIT-TENOFAF 150-150-200-10 MG PO TABS: 1.0000 | ORAL_TABLET | Freq: Every day | ORAL | Status: DC

## 2014-11-12 NOTE — Assessment & Plan Note (Signed)
I discussed streamlining the regimen and will change to Prezista and Stribild.  He will change with next refill and I will see him in 4 months.   Offered, refused condoms.  Not sexually active.

## 2014-11-12 NOTE — Progress Notes (Signed)
  Subjective:    Patient ID: Shawn Brooks, male    DOB: 10/16/1975, 39 y.o.   MRN: [id number]  HPI He comes in for followup of his HIV. He is on Prezista, Norvir and Truvada and I added Tivicay last year with low level persistent viremia. He denies any missed doses and finally was undetectable but last time up to 170.  I had him come again for labs earlier this month and viral load now back down to just 35 copies.     Seeing Dr. Johney Maine of [facility] for anal condyloma.      Review of Systems  Constitutional: Negative for fatigue and unexpected weight change.  HENT: Negative for sore throat and trouble swallowing.   Eyes: Negative for visual disturbance.  Respiratory: Negative for cough and shortness of breath.   Cardiovascular: Negative for chest pain.  Gastrointestinal: Negative for nausea, abdominal pain, diarrhea and anal bleeding.  Genitourinary: Negative for discharge and genital sores.  Musculoskeletal: Positive for back pain.  Neurological: Negative for dizziness and headaches.  Hematological: Negative for adenopathy.  Psychiatric/Behavioral: Negative for dysphoric mood.       Objective:   Physical Exam  Constitutional: He appears well-developed and well-nourished. No distress.  HENT:  Mouth/Throat: Oropharynx is clear and moist. No oropharyngeal exudate.  Eyes: No scleral icterus.  Cardiovascular: Normal rate, regular rhythm and normal heart sounds.  Exam reveals no gallop and no friction rub.   No murmur heard. Pulmonary/Chest: Effort normal and breath sounds normal. No respiratory distress. He has no wheezes. He has no rales.  Lymphadenopathy:    He has no cervical adenopathy.  Skin: Skin is warm and dry. No rash noted.          Assessment & Plan:

## 2015-01-31 ENCOUNTER — Encounter: Payer: Self-pay | Admitting: *Deleted

## 2015-02-18 ENCOUNTER — Other Ambulatory Visit: Payer: Self-pay | Admitting: Internal Medicine

## 2015-02-18 DIAGNOSIS — K219 Gastro-esophageal reflux disease without esophagitis: Secondary | ICD-10-CM

## 2015-03-12 ENCOUNTER — Telehealth: Payer: Self-pay | Admitting: *Deleted

## 2015-03-12 NOTE — Telephone Encounter (Signed)
Patient called and advised he is having trouble with rectal abcess. He has had this problem before and knows what it feels like and look like. He was seen at [facility] for this issue advised will call and ask what we need to do to have the patient seen there. Was advised they will have him speak to financial assistant and she will page the doctor to see when they can see the patient. Dr Morton Stall will need to speak with Dr Linus Salmons about the patient prior to scheduling. Due the this and that the doctor has no available appt until mid October the patient was advised to go to the ED or Urgent Care. The patient advised he was going to ED anyway because he was told by financial office will cost him over $500 to be seen and have procedure done.

## 2015-03-13 ENCOUNTER — Observation Stay (HOSPITAL_COMMUNITY): Payer: Self-pay | Admitting: Anesthesiology

## 2015-03-13 ENCOUNTER — Observation Stay (HOSPITAL_COMMUNITY)
Admission: EM | Admit: 2015-03-13 | Discharge: 2015-03-14 | Disposition: A | Discharge status: Home / Self Care | Payer: Self-pay | Attending: Surgery | Admitting: Surgery

## 2015-03-13 ENCOUNTER — Encounter (HOSPITAL_COMMUNITY): Payer: Self-pay | Admitting: Emergency Medicine

## 2015-03-13 ENCOUNTER — Encounter (HOSPITAL_COMMUNITY)
Admission: EM | Disposition: A | Discharge status: Home / Self Care | Payer: Self-pay | Source: Home / Self Care | Attending: Emergency Medicine

## 2015-03-13 DIAGNOSIS — G709 Myoneural disorder, unspecified: Secondary | ICD-10-CM | POA: Insufficient documentation

## 2015-03-13 DIAGNOSIS — M549 Dorsalgia, unspecified: Secondary | ICD-10-CM | POA: Insufficient documentation

## 2015-03-13 DIAGNOSIS — F17210 Nicotine dependence, cigarettes, uncomplicated: Secondary | ICD-10-CM | POA: Insufficient documentation

## 2015-03-13 DIAGNOSIS — K611 Rectal abscess: Principal | ICD-10-CM | POA: Diagnosis present

## 2015-03-13 DIAGNOSIS — B20 Human immunodeficiency virus [HIV] disease: Secondary | ICD-10-CM | POA: Insufficient documentation

## 2015-03-13 DIAGNOSIS — Z79899 Other long term (current) drug therapy: Secondary | ICD-10-CM | POA: Insufficient documentation

## 2015-03-13 DIAGNOSIS — G8929 Other chronic pain: Secondary | ICD-10-CM | POA: Insufficient documentation

## 2015-03-13 DIAGNOSIS — E785 Hyperlipidemia, unspecified: Secondary | ICD-10-CM | POA: Insufficient documentation

## 2015-03-13 HISTORY — PX: IRRIGATION AND DEBRIDEMENT ABSCESS: SHX5252

## 2015-03-13 LAB — CBC WITH DIFFERENTIAL/PLATELET
BASOS ABS: 0 10*3/uL (ref 0.0–0.1)
Basophils Relative: 0 %
Eosinophils Absolute: 0.1 10*3/uL (ref 0.0–0.7)
Eosinophils Relative: 1 %
HEMATOCRIT: 42.6 % (ref 39.0–52.0)
Hemoglobin: 14.5 g/dL (ref 13.0–17.0)
LYMPHS PCT: 25 %
Lymphs Abs: 3 10*3/uL (ref 0.7–4.0)
MCH: 30.9 pg (ref 26.0–34.0)
MCHC: 34 g/dL (ref 30.0–36.0)
MCV: 90.6 fL (ref 78.0–100.0)
Monocytes Absolute: 1 10*3/uL (ref 0.1–1.0)
Monocytes Relative: 9 %
Neutro Abs: 7.7 10*3/uL (ref 1.7–7.7)
Neutrophils Relative %: 65 %
Platelets: 195 10*3/uL (ref 150–400)
RBC: 4.7 MIL/uL (ref 4.22–5.81)
RDW: 14 % (ref 11.5–15.5)
WBC: 11.8 10*3/uL — AB (ref 4.0–10.5)

## 2015-03-13 LAB — SURGICAL PCR SCREEN
MRSA, PCR: NEGATIVE
Staphylococcus aureus: NEGATIVE

## 2015-03-13 LAB — BASIC METABOLIC PANEL
ANION GAP: 7 (ref 5–15)
BUN: 11 mg/dL (ref 6–20)
CALCIUM: 9.2 mg/dL (ref 8.9–10.3)
CHLORIDE: 106 mmol/L (ref 101–111)
CO2: 24 mmol/L (ref 22–32)
Creatinine, Ser: 1.37 mg/dL — ABNORMAL HIGH (ref 0.61–1.24)
GFR calc non Af Amer: >60 mL/min (ref >60)
GLUCOSE: 98 mg/dL (ref 65–99)
POTASSIUM: 4.2 mmol/L (ref 3.5–5.1)
Sodium: 137 mmol/L (ref 135–145)

## 2015-03-13 SURGERY — IRRIGATION AND DEBRIDEMENT ABSCESS
Anesthesia: General

## 2015-03-13 MED ORDER — BUPIVACAINE-EPINEPHRINE 0.25% -1:200000 IJ SOLN
INTRAMUSCULAR | Status: DC | PRN
  Administered 2015-03-13: 10 mL

## 2015-03-13 MED ORDER — MIDAZOLAM HCL 5 MG/5ML IJ SOLN
INTRAMUSCULAR | Status: DC | PRN
  Administered 2015-03-13: 2 mg via INTRAVENOUS

## 2015-03-13 MED ORDER — ROCURONIUM BROMIDE 100 MG/10ML IV SOLN
INTRAVENOUS | Status: AC
  Filled 2015-03-13: qty 1

## 2015-03-13 MED ORDER — LACTATED RINGERS IV SOLN
INTRAVENOUS | Status: DC
  Administered 2015-03-13: 1000 mL via INTRAVENOUS

## 2015-03-13 MED ORDER — VALACYCLOVIR HCL 500 MG PO TABS
500.0000 mg | ORAL_TABLET | Freq: Two times a day (BID) | ORAL | Status: DC | PRN
  Filled 2015-03-13: qty 1

## 2015-03-13 MED ORDER — FENTANYL CITRATE (PF) 250 MCG/5ML IJ SOLN
INTRAMUSCULAR | Status: AC
  Filled 2015-03-13: qty 25

## 2015-03-13 MED ORDER — LIDOCAINE HCL (CARDIAC) 20 MG/ML IV SOLN
INTRAVENOUS | Status: DC | PRN
  Administered 2015-03-13: 100 mg via INTRAVENOUS

## 2015-03-13 MED ORDER — LIDOCAINE-EPINEPHRINE (PF) 2 %-1:200000 IJ SOLN: 10.0000 mL | Freq: Once | INTRAMUSCULAR | Status: DC

## 2015-03-13 MED ORDER — MORPHINE SULFATE (PF) 2 MG/ML IV SOLN
2.0000 mg | Freq: Once | INTRAVENOUS | Status: AC
  Administered 2015-03-13: 2 mg via INTRAVENOUS
  Filled 2015-03-13: qty 1

## 2015-03-13 MED ORDER — ONDANSETRON 4 MG PO TBDP: 4.0000 mg | ORAL_TABLET | Freq: Four times a day (QID) | ORAL | Status: DC | PRN

## 2015-03-13 MED ORDER — DIPHENHYDRAMINE HCL 25 MG PO CAPS: 25.0000 mg | ORAL_CAPSULE | Freq: Four times a day (QID) | ORAL | Status: DC | PRN

## 2015-03-13 MED ORDER — SIMETHICONE 80 MG PO CHEW
40.0000 mg | CHEWABLE_TABLET | Freq: Four times a day (QID) | ORAL | Status: DC | PRN
  Filled 2015-03-13: qty 1

## 2015-03-13 MED ORDER — ARIPIPRAZOLE 5 MG PO TABS
5.0000 mg | ORAL_TABLET | Freq: Every day | ORAL | Status: DC
  Filled 2015-03-13 (×2): qty 1

## 2015-03-13 MED ORDER — CYCLOBENZAPRINE HCL 10 MG PO TABS: 10.0000 mg | ORAL_TABLET | Freq: Two times a day (BID) | ORAL | Status: DC | PRN

## 2015-03-13 MED ORDER — MIDAZOLAM HCL 2 MG/2ML IJ SOLN
INTRAMUSCULAR | Status: AC
  Filled 2015-03-13: qty 4

## 2015-03-13 MED ORDER — HYDROMORPHONE HCL 1 MG/ML IJ SOLN
0.5000 mg | INTRAMUSCULAR | Status: DC | PRN
  Administered 2015-03-13: 1 mg via INTRAVENOUS
  Filled 2015-03-13: qty 1

## 2015-03-13 MED ORDER — FENTANYL CITRATE (PF) 100 MCG/2ML IJ SOLN: 25.0000 ug | INTRAMUSCULAR | Status: DC | PRN

## 2015-03-13 MED ORDER — PROPOFOL 10 MG/ML IV BOLUS
INTRAVENOUS | Status: AC
  Filled 2015-03-13: qty 20

## 2015-03-13 MED ORDER — HYDRALAZINE HCL 20 MG/ML IJ SOLN: 10.0000 mg | INTRAMUSCULAR | Status: DC | PRN

## 2015-03-13 MED ORDER — PIPERACILLIN-TAZOBACTAM 3.375 G IVPB
3.3750 g | Freq: Three times a day (TID) | INTRAVENOUS | Status: DC
  Administered 2015-03-13: 3.375 g via INTRAVENOUS
  Filled 2015-03-13 (×2): qty 50

## 2015-03-13 MED ORDER — DOCUSATE SODIUM 100 MG PO CAPS: 100.0000 mg | ORAL_CAPSULE | Freq: Two times a day (BID) | ORAL | Status: DC

## 2015-03-13 MED ORDER — LACTATED RINGERS IV SOLN: INTRAVENOUS | Status: DC

## 2015-03-13 MED ORDER — PROPOFOL 10 MG/ML IV BOLUS
INTRAVENOUS | Status: DC | PRN
  Administered 2015-03-13: 250 mg via INTRAVENOUS
  Administered 2015-03-13: 40 mg via INTRAVENOUS

## 2015-03-13 MED ORDER — DIPHENHYDRAMINE HCL 50 MG/ML IJ SOLN: 25.0000 mg | Freq: Four times a day (QID) | INTRAMUSCULAR | Status: DC | PRN

## 2015-03-13 MED ORDER — TRAZODONE HCL 100 MG PO TABS
100.0000 mg | ORAL_TABLET | Freq: Every evening | ORAL | Status: DC | PRN
  Administered 2015-03-13: 100 mg via ORAL
  Filled 2015-03-13: qty 1
  Filled 2015-03-13: qty 2
  Filled 2015-03-13: qty 1

## 2015-03-13 MED ORDER — HYDROCODONE-ACETAMINOPHEN 5-325 MG PO TABS: 1.0000 | ORAL_TABLET | ORAL | Status: DC | PRN

## 2015-03-13 MED ORDER — ESCITALOPRAM OXALATE 20 MG PO TABS: 20.0000 mg | ORAL_TABLET | Freq: Every day | ORAL | Status: DC

## 2015-03-13 MED ORDER — ACETAMINOPHEN 500 MG PO TABS
500.0000 mg | ORAL_TABLET | Freq: Four times a day (QID) | ORAL | Status: DC | PRN
  Administered 2015-03-14: 500 mg via ORAL
  Filled 2015-03-13: qty 1

## 2015-03-13 MED ORDER — ELVITEG-COBIC-EMTRICIT-TENOFAF 150-150-200-10 MG PO TABS
1.0000 | ORAL_TABLET | Freq: Every day | ORAL | Status: DC
  Administered 2015-03-13: 1 via ORAL
  Filled 2015-03-13 (×3): qty 1

## 2015-03-13 MED ORDER — DARUNAVIR ETHANOLATE 800 MG PO TABS
800.0000 mg | ORAL_TABLET | Freq: Every day | ORAL | Status: DC
  Administered 2015-03-13: 800 mg via ORAL
  Filled 2015-03-13 (×3): qty 1

## 2015-03-13 MED ORDER — FENTANYL CITRATE (PF) 100 MCG/2ML IJ SOLN
INTRAMUSCULAR | Status: DC | PRN
  Administered 2015-03-13 (×2): 50 ug via INTRAVENOUS

## 2015-03-13 MED ORDER — ONDANSETRON HCL 4 MG/2ML IJ SOLN
4.0000 mg | Freq: Four times a day (QID) | INTRAMUSCULAR | Status: DC | PRN
  Administered 2015-03-13: 4 mg via INTRAVENOUS

## 2015-03-13 MED ORDER — LIDOCAINE HCL (CARDIAC) 20 MG/ML IV SOLN
INTRAVENOUS | Status: AC
  Filled 2015-03-13: qty 5

## 2015-03-13 MED ORDER — LIDOCAINE HCL 2 % IJ SOLN
INTRAMUSCULAR | Status: AC
  Filled 2015-03-13: qty 20

## 2015-03-13 MED ORDER — POTASSIUM CHLORIDE IN NACL 20-0.9 MEQ/L-% IV SOLN
INTRAVENOUS | Status: DC
  Administered 2015-03-13: 1 mL/h via INTRAVENOUS
  Filled 2015-03-13 (×2): qty 1000

## 2015-03-13 MED ORDER — METHOCARBAMOL 500 MG PO TABS: 1000.0000 mg | ORAL_TABLET | Freq: Three times a day (TID) | ORAL | Status: DC | PRN

## 2015-03-13 MED ORDER — TRAMADOL HCL 50 MG PO TABS
50.0000 mg | ORAL_TABLET | Freq: Two times a day (BID) | ORAL | Status: DC | PRN
  Administered 2015-03-13: 50 mg via ORAL
  Filled 2015-03-13: qty 1

## 2015-03-13 MED ORDER — KCL IN DEXTROSE-NACL 20-5-0.45 MEQ/L-%-% IV SOLN
INTRAVENOUS | Status: DC
  Administered 2015-03-13: 18:00:00 via INTRAVENOUS
  Filled 2015-03-13 (×2): qty 1000

## 2015-03-13 MED ORDER — PANTOPRAZOLE SODIUM 40 MG PO TBEC
40.0000 mg | DELAYED_RELEASE_TABLET | Freq: Every day | ORAL | Status: DC
  Administered 2015-03-13: 40 mg via ORAL
  Filled 2015-03-13 (×2): qty 1

## 2015-03-13 MED ORDER — HEPARIN SODIUM (PORCINE) 5000 UNIT/ML IJ SOLN
5000.0000 [IU] | Freq: Three times a day (TID) | INTRAMUSCULAR | Status: DC
  Administered 2015-03-14: 5000 [IU] via SUBCUTANEOUS
  Filled 2015-03-13 (×4): qty 1

## 2015-03-13 MED ORDER — BUPIVACAINE-EPINEPHRINE (PF) 0.25% -1:200000 IJ SOLN
INTRAMUSCULAR | Status: AC
  Filled 2015-03-13: qty 30

## 2015-03-13 SURGICAL SUPPLY — 33 items
BENZOIN TINCTURE PRP APPL 2/3 (GAUZE/BANDAGES/DRESSINGS) ×2 IMPLANT
BLADE HEX COATED 2.75 (ELECTRODE) ×2 IMPLANT
BLADE SURG 15 STRL LF DISP TIS (BLADE) ×1 IMPLANT
BLADE SURG 15 STRL SS (BLADE) ×1
BLADE SURG SZ10 CARB STEEL (BLADE) ×2 IMPLANT
COVER SURGICAL LIGHT HANDLE (MISCELLANEOUS) ×2 IMPLANT
DECANTER SPIKE VIAL GLASS SM (MISCELLANEOUS) ×2 IMPLANT
DRAPE LAPAROTOMY TRNSV 102X78 (DRAPE) ×2 IMPLANT
ELECT PENCIL ROCKER SW 15FT (MISCELLANEOUS) ×2 IMPLANT
ELECT REM PT RETURN 9FT ADLT (ELECTROSURGICAL) ×2
ELECTRODE REM PT RTRN 9FT ADLT (ELECTROSURGICAL) ×1 IMPLANT
GAUZE IODOFORM PACK 1/2 7832 (GAUZE/BANDAGES/DRESSINGS) ×2 IMPLANT
GAUZE PACKING 1/2X5YD (GAUZE/BANDAGES/DRESSINGS) ×2 IMPLANT
GAUZE SPONGE 4X4 12PLY STRL (GAUZE/BANDAGES/DRESSINGS) ×2 IMPLANT
GAUZE SPONGE 4X4 16PLY XRAY LF (GAUZE/BANDAGES/DRESSINGS) ×2 IMPLANT
GLOVE BIOGEL M 8.0 STRL (GLOVE) ×2 IMPLANT
GLOVE BIOGEL PI IND STRL 7.0 (GLOVE) ×1 IMPLANT
GLOVE BIOGEL PI INDICATOR 7.0 (GLOVE) ×1
GOWN SPEC L4 XLG W/TWL (GOWN DISPOSABLE) ×2 IMPLANT
GOWN STRL REUS W/TWL LRG LVL3 (GOWN DISPOSABLE) ×2 IMPLANT
GOWN STRL REUS W/TWL XL LVL3 (GOWN DISPOSABLE) ×6 IMPLANT
KIT BASIN OR (CUSTOM PROCEDURE TRAY) ×2 IMPLANT
NEEDLE HYPO 22GX1.5 SAFETY (NEEDLE) ×2 IMPLANT
NEEDLE HYPO 25X1 1.5 SAFETY (NEEDLE) ×2 IMPLANT
NS IRRIG 1000ML POUR BTL (IV SOLUTION) ×2 IMPLANT
PACK BASIC VI WITH GOWN DISP (CUSTOM PROCEDURE TRAY) ×2 IMPLANT
PEN SKIN MARKING BROAD (MISCELLANEOUS) ×2 IMPLANT
SOL PREP POV-IOD 4OZ 10% (MISCELLANEOUS) ×2 IMPLANT
STRIP CLOSURE SKIN 1/2X4 (GAUZE/BANDAGES/DRESSINGS) ×2 IMPLANT
SUT VIC AB 4-0 SH 18 (SUTURE) ×2 IMPLANT
SYR CONTROL 10ML LL (SYRINGE) ×2 IMPLANT
WATER STERILE IRR 1500ML POUR (IV SOLUTION) ×2 IMPLANT
YANKAUER SUCT BULB TIP 10FT TU (MISCELLANEOUS) IMPLANT

## 2015-03-13 NOTE — Transfer of Care (Signed)
Immediate Anesthesia Transfer of Care Note  Patient: Shawn Brooks  Procedure(s) Performed: Procedure(s): IRRIGATION AND DEBRIDEMENT ABSCESS (N/A)  Patient Location: PACU  Anesthesia Type:General  Level of Consciousness: awake, alert  and oriented  Airway & Oxygen Therapy: Patient Spontanous Breathing and Patient connected to face mask oxygen  Post-op Assessment: Report given to RN and Post -op Vital signs reviewed and stable  Post vital signs: Reviewed and stable  Last Vitals:  Filed Vitals:   03/13/15 1230  BP: 122/72  Pulse: 65  Temp:   Resp: 18    Complications: No apparent anesthesia complications

## 2015-03-13 NOTE — Op Note (Signed)
Surgeon: Kaylyn Lim, MD, FACS  Asst:  none  Anes:  general  Procedure: Incision and drainage of perirectal abscess left anterior quadrant   Diagnosis: Perirectal abscess and HIV-abscess cultured  Complications: none  EBL:   minimal cc  Drains: none  Description of Procedure:  The patient was taken to OR 6 at [facility].  After anesthesia was administered and the patient was prepped a timeout was performed.  With the patient in dorsal lithotomy EUA was performed.  Fluctuant area on the left side anterioraly (1 oclock-dorsal lithotomy) Aspiration of pus for culture;  Opened and evacuated and packed with 1/2" iodophor gauze.    The patient tolerated the procedure well and was taken to the PACU in stable condition.     Matt B. [name], [place], [facility], [place]

## 2015-03-13 NOTE — ED Notes (Signed)
Pt states he has an abscess near his rectum  Pt states it has been hurting for a couple days but got yesterday  Pt states it is not draining

## 2015-03-13 NOTE — ED Provider Notes (Signed)
Complains of painful abscesss at left perirectal area onset 2-3 days. He's been unable to have a bowel movement due to pain since onset of pain. Patient had similar abscess incised and drained by general surgery service April 2014. He denies fever denies abdominal pain no other associated symptoms  Orlie Dakin, MD 03/13/15 [phone]

## 2015-03-13 NOTE — ED Provider Notes (Signed)
CSN: [id number]     Arrival date & time 03/13/15  0542 History   First MD Initiated Contact with Patient 03/13/15 [phone]     Chief Complaint  Patient presents with  . Abscess   HPI  Mr. Schoffstall is a 39 year old male with PMHx of HIV presenting with recurrent perirectal abscess. Pt states he noticed the abscess forming over the past 2-3 days with acute worsening in pain yesterday. He denies drainage or bleeding from the abscess or per rectum. He states he has had two perirectal abscesses in the past; one drained in the OR at [facility] and another I&D by general surgery in [facility]. He has not had a bowel movement in 2 days due to pain in rectum. Denies systemic symptoms; fevers, chills, diaphoresis, nausea, vomiting. Denies headache, chest pain, SOB, abdominal pain, dysuria, change in urinary frequency or testicular pain.    Past Medical History  Diagnosis Date  . HIV infection   . Blood dyscrasia     HIV  . Chronic back pain   . Depression   . Anxiety   . Headache(784.0)   . DJD (degenerative joint disease)    Past Surgical History  Procedure Laterality Date  . Wisdom tooth extraction    . Esophagogastroduodenoscopy  03/27/2012    Procedure: ESOPHAGOGASTRODUODENOSCOPY (EGD);  Surgeon: Lear Ng, MD;  Location: [facility];  Service: Endoscopy;  Laterality: N/A;   Family History  Problem Relation Age of Onset  . Cancer Mother     laryngeal  . Pneumonia Father   . Diabetes Father   . Hypertension Sister   . Crohn's disease Brother   . Crohn's disease Maternal Grandmother   . Cancer Maternal Grandmother     patient unsure of type  . Colon cancer Maternal Grandfather    Social History  Substance Use Topics  . Smoking status: Current Every Day Smoker -- 0.50 packs/day    Types: Cigarettes  . Smokeless tobacco: Never Used  . Alcohol Use: 1.2 oz/week    2 Standard drinks or equivalent per week     Comment: beer at times. not often    Review of Systems  Constitutional:  Negative for fever, chills and diaphoresis.  Respiratory: Negative for shortness of breath.   Cardiovascular: Negative for chest pain.  Gastrointestinal: Positive for constipation and rectal pain. Negative for nausea, vomiting, abdominal pain and anal bleeding.  Genitourinary: Negative for dysuria, frequency, decreased urine volume, scrotal swelling, difficulty urinating and testicular pain.  Skin: Positive for wound.  Neurological: Negative for headaches.      Allergies  Review of patient's allergies indicates no known allergies.  Home Medications   Prior to Admission medications   Medication Sig Start Date End Date Taking? Authorizing Provider  ARIPiprazole (ABILIFY) 5 MG tablet Take 5 mg by mouth daily.   Yes Historical Provider, MD  Darunavir Ethanolate (PREZISTA) 800 MG tablet Take 1 tablet (800 mg total) by mouth daily. 11/12/14  Yes Thayer Headings, MD  elvitegravir-cobicistat-emtricitabine-tenofovir (GENVOYA) 150-150-200-10 MG TABS tablet Take 1 tablet by mouth daily. 11/12/14  Yes Thayer Headings, MD  omeprazole (PRILOSEC) 20 MG capsule TAKE 1 CAPSULE BY MOUTH DAILY 02/18/15  Yes Thayer Headings, MD  traZODone (DESYREL) 100 MG tablet Take 100 mg by mouth at bedtime as needed for sleep.  07/24/14  Yes Historical Provider, MD  acetaminophen (TYLENOL) 500 MG tablet Take 1 tablet (500 mg total) by mouth every 6 (six) hours as needed. Patient not taking: Reported  on 03/13/2015 01/18/14   Tresa Garter, MD  cyclobenzaprine (FLEXERIL) 10 MG tablet Take 1 tablet (10 mg total) by mouth 2 (two) times daily as needed for muscle spasms. Patient not taking: Reported on 03/13/2015 06/12/14   Lance Bosch, NP  escitalopram (LEXAPRO) 20 MG tablet Take 1 tablet (20 mg total) by mouth daily. Patient not taking: Reported on 03/13/2015 10/18/13   Thayer Headings, MD  traMADol (ULTRAM) 50 MG tablet Take 1 tablet (50 mg total) by mouth every 12 (twelve) hours as needed. Patient not taking: Reported on  03/13/2015 06/12/14   Lance Bosch, NP  valACYclovir (VALTREX) 500 MG tablet Take 1 tablet (500 mg total) by mouth 2 (two) times daily. Take by mouth. Take 500 mg by mouth 2 (two) times daily as needed. For flare-ups. 10/08/14   Thayer Headings, MD   BP 123/62 mmHg  Pulse 74  Temp(Src) 99.1 F (37.3 C) (Oral)  Resp 18  SpO2 99% Physical Exam  Constitutional: He appears well-developed and well-nourished. No distress.  HENT:  Head: Normocephalic and atraumatic.  Eyes: Conjunctivae are normal. Right eye exhibits no discharge. Left eye exhibits no discharge. No scleral icterus.  Neck: Normal range of motion.  Cardiovascular: Normal rate, regular rhythm and normal heart sounds.   Pulmonary/Chest: Effort normal and breath sounds normal. No respiratory distress.  Abdominal: Soft. There is no tenderness.  Genitourinary:  4 cm area of induration left perirectal with central fluctuance. No erythema or streaking around abscess. No scrotal swelling.   Musculoskeletal: Normal range of motion.  Neurological: He is alert. Coordination normal.  Skin: Skin is warm and dry.  Psychiatric: He has a normal mood and affect. His behavior is normal.  Nursing note and vitals reviewed.   ED Course  Procedures (including critical care time) Labs Review Labs Reviewed  CBC WITH DIFFERENTIAL/PLATELET - Abnormal; Notable for the following:    WBC 11.8 (*)    All other components within normal limits  BASIC METABOLIC PANEL - Abnormal; Notable for the following:    Creatinine, Ser 1.37 (*)    All other components within normal limits  ANAEROBIC CULTURE  CULTURE, ROUTINE-ABSCESS  SURGICAL PCR SCREEN  CBC  BASIC METABOLIC PANEL    Imaging Review No results found. I have personally reviewed and evaluated these images and lab results as part of my medical decision-making.   EKG Interpretation None      MDM   Final diagnoses:  Perirectal abscess   Pt presenting with recurrent left perirectal  abscess. Abscess formed 2-3 days ago with increasing pain yesterday. No systemic symptoms of infection. Pt has not had BM in 2 days 2/2 to pain. VSS. Pt non-toxic appearing and resting comfortably. Induration with central fluctuance noted to left of rectum. No drainage. WBC 11.8. History of perirectal abscesses needing surgical drainage, consulted to gen surg who will admit pt for further treatment.     Lahoma Crocker Appollonia Klee, PA-C 03/13/15 2134  Julianne Rice, MD 03/13/15 2312

## 2015-03-13 NOTE — Anesthesia Preprocedure Evaluation (Addendum)
Anesthesia Evaluation  Patient identified by MRN, date of birth, ID band Patient awake    Reviewed: Allergy & Precautions, H&P , NPO status , Patient's Chart, lab work & pertinent test results  Airway Mallampati: II  TM Distance: >3 FB Neck ROM: full    Dental  (+) Dental Advisory Given   Pulmonary neg pulmonary ROS, Current Smoker,    Pulmonary exam normal breath sounds clear to auscultation       Cardiovascular Exercise Tolerance: Good negative cardio ROS Normal cardiovascular exam Rhythm:regular Rate:Normal     Neuro/Psych  Headaches, Anxiety Depression  Neuromuscular disease negative neurological ROS  negative psych ROS   GI/Hepatic negative GI ROS, Neg liver ROS,   Endo/Other  negative endocrine ROS  Renal/GU negative Renal ROSCRT 1.37  negative genitourinary   Musculoskeletal   Abdominal   Peds  Hematology  (+) Blood dyscrasia, , HIV,   Anesthesia Other Findings   Reproductive/Obstetrics negative OB ROS                           Anesthesia Physical Anesthesia Plan  ASA: III  Anesthesia Plan: General   Post-op Pain Management:    Induction: Intravenous  Airway Management Planned: LMA  Additional Equipment:   Intra-op Plan:   Post-operative Plan:   Informed Consent: I have reviewed the patients History and Physical, chart, labs and discussed the procedure including the risks, benefits and alternatives for the proposed anesthesia with the patient or authorized representative who has indicated his/her understanding and acceptance.   Dental Advisory Given  Plan Discussed with: CRNA and Surgeon  Anesthesia Plan Comments:        Anesthesia Quick Evaluation

## 2015-03-13 NOTE — Anesthesia Procedure Notes (Signed)
Procedure Name: LMA Insertion Date/Time: 03/13/2015 3:36 PM Performed by: Glory Buff Pre-anesthesia Checklist: Patient identified, Emergency Drugs available, Suction available, Patient being monitored and Timeout performed Patient Re-evaluated:Patient Re-evaluated prior to inductionOxygen Delivery Method: Circle system utilized Preoxygenation: Pre-oxygenation with 100% oxygen Intubation Type: IV induction LMA: LMA inserted LMA Size: 5.0 Number of attempts: 1 Placement Confirmation: CO2 detector Tube secured with: Tape

## 2015-03-13 NOTE — H&P (Signed)
[facility] Admission Note  Shawn Brooks 1975-11-18  [id number].    Requesting MD: Dr. Julien Girt Chief Complaint/Reason for Consult: peri-rectal abscess  HPI:  Pt is a 39 y/o AA male with a PMH of HIV (followed by Dr. Linus Salmons), perirectal abscess, and back pain. He presented to the [facility] ED today with a recurrence of his peri-rectal abscess. He states this has happened twice before, in 2013 it was drained in the OR at [facility], and in 2014 it was lanced in the ED. He noticed the abscess starting to form 2-3 days ago after using the bathroom. He states that the pain started yesterday and is limited to the area around the abscess. The pain is non-radiating. He denies any purulent or bloody drainage from his rectum. He has not had a BM in 2 days due to the pain. Pt denies any precipitating factors and was unable to rate his pain. He has taken no pain medications.  He denies any associated sxs including recent illness/infection, fever, chills, night sweats, heart palpitations, CP, SOB, cough, abd pain, N/V/D, hematochezia, dysuria, hematuria, HA, and skin changes.  ROS: All systems reviewed and otherwise negative except for as above  Family History  Problem Relation Age of Onset  . Cancer Mother     laryngeal  . Pneumonia Father   . Diabetes Father   . Hypertension Sister   . Crohn's disease Brother   . Crohn's disease Maternal Grandmother   . Cancer Maternal Grandmother     patient unsure of type  . Colon cancer Maternal Grandfather     Past Medical History  Diagnosis Date  . HIV infection   . Hyperlipidemia   . Blood dyscrasia     HIV  . Chronic back pain   . Depression   . Anxiety   . Headache(784.0)   . Vertigo   . DJD (degenerative joint disease)   . Anemia     Past Surgical History  Procedure Laterality Date  . Wisdom tooth extraction    . Esophagogastroduodenoscopy  03/27/2012    Procedure: ESOPHAGOGASTRODUODENOSCOPY (EGD);  Surgeon: Lear Ng, MD;   Location: [facility];  Service: Endoscopy;  Laterality: N/A;    Social History:  reports that he has been smoking Cigarettes.  He has been smoking about 0.50 packs per day. He has never used smokeless tobacco. He reports that he drinks about 1.2 oz of alcohol per week. He reports that he uses illicit drugs (Marijuana) about 7 times per week.  Allergies: No Known Allergies   (Not in a hospital admission)  Blood pressure 133/88, pulse 79, temperature 99.4 F (37.4 C), temperature source Oral, resp. rate 18, SpO2 96 %. Physical Exam: General: pleasant, WD/WN african [place] male who is laying in bed in NAD HEENT: head is normocephalic, atraumatic.  Sclera are noninjected.  PERRL.  Ears and nose without any masses or lesions.  Mouth is pink and moist Heart: regular, rate, and rhythm.  No obvious murmurs, gallops, or rubs noted.  Palpable pedal pulses bilaterally Lungs: CTAB, no wheezes, rhonchi, or rales noted.  Respiratory effort nonlabored Abd: soft, NT/ND, +BS, no masses, hernias, or organomegaly GU: DRE WNL, no thinning of the rectal mucosa adjacent to abscess formation. No nodularities of the prostate appreciated. MS: all 4 extremities are symmetrical with no cyanosis, clubbing, or edema. Skin: warm and dry with no rashes. 5-6 cm, oval, fluctuant abscess on the left side of the rectum. No purulent drainage or bleeding.  Psych: A&Ox3 with  an appropriate affect. Neuro: extremity CSM intact bilaterally, normal speech  Results for orders placed or performed during the hospital encounter of 03/13/15 (from the past 48 hour(s))  CBC with Differential     Status: Abnormal   Collection Time: 03/13/15  9:07 AM  Result Value Ref Range   WBC 11.8 (H) 4.0 - 10.5 K/uL   RBC 4.70 4.22 - 5.81 MIL/uL   Hemoglobin 14.5 13.0 - 17.0 g/dL   HCT 42.6 39.0 - 52.0 %   MCV 90.6 78.0 - 100.0 fL   MCH 30.9 26.0 - 34.0 pg   MCHC 34.0 30.0 - 36.0 g/dL   RDW 14.0 11.5 - 15.5 %   Platelets 195 150 - 400  K/uL   Neutrophils Relative % 65 %   Neutro Abs 7.7 1.7 - 7.7 K/uL   Lymphocytes Relative 25 %   Lymphs Abs 3.0 0.7 - 4.0 K/uL   Monocytes Relative 9 %   Monocytes Absolute 1.0 0.1 - 1.0 K/uL   Eosinophils Relative 1 %   Eosinophils Absolute 0.1 0.0 - 0.7 K/uL   Basophils Relative 0 %   Basophils Absolute 0.0 0.0 - 0.1 K/uL  Basic metabolic panel     Status: Abnormal   Collection Time: 03/13/15  9:07 AM  Result Value Ref Range   Sodium 137 135 - 145 mmol/L   Potassium 4.2 3.5 - 5.1 mmol/L   Chloride 106 101 - 111 mmol/L   CO2 24 22 - 32 mmol/L   Glucose, Bld 98 65 - 99 mg/dL   BUN 11 6 - 20 mg/dL   Creatinine, Ser 1.37 (H) 0.61 - 1.24 mg/dL   Calcium 9.2 8.9 - 10.3 mg/dL   GFR calc non Af Amer >60 >60 mL/min   GFR calc Af Amer >60 >60 mL/min    Comment: (NOTE) The eGFR has been calculated using the CKD EPI equation. This calculation has not been validated in all clinical situations. eGFR's persistently <60 mL/min signify possible Chronic Kidney Disease.    Anion gap 7 5 - 15   No results found. Assessment/Plan  Primary problem: Recurrent Peri-rectal abscess 1.  Admit to general surgery for urgent incision and drainage 2.  NPO, IVF, pain control, antiemetics, antibiotics (start Zosyn) 3.  SCD's and lovenox for DVT proph 4.  Ambulate and IS   HIV Back pain DJD  Strickland, Elizabeth, PA-C [facility] 03/13/2015, 11:09 AM Pager: Physician Assistant Student, [employer]  I have seen and examined this patient and agree with the assessment and plan.   Matt B. , MD, FACS  [facility] [phone] beeper [phone]  03/13/2015 3:04 PM  

## 2015-03-14 ENCOUNTER — Encounter (HOSPITAL_COMMUNITY): Payer: Self-pay | Admitting: Surgery

## 2015-03-14 LAB — BASIC METABOLIC PANEL
Anion gap: 6 (ref 5–15)
BUN: 13 mg/dL (ref 6–20)
CO2: 28 mmol/L (ref 22–32)
CREATININE: 1.44 mg/dL — AB (ref 0.61–1.24)
Calcium: 9.6 mg/dL (ref 8.9–10.3)
Chloride: 106 mmol/L (ref 101–111)
GFR calc Af Amer: >60 mL/min (ref >60)
GFR, EST NON AFRICAN AMERICAN: 59 mL/min — AB (ref >60)
Glucose, Bld: 99 mg/dL (ref 65–99)
POTASSIUM: 4.2 mmol/L (ref 3.5–5.1)
SODIUM: 140 mmol/L (ref 135–145)

## 2015-03-14 LAB — CBC
HCT: 41.4 % (ref 39.0–52.0)
Hemoglobin: 14.1 g/dL (ref 13.0–17.0)
MCH: 30.7 pg (ref 26.0–34.0)
MCHC: 34.1 g/dL (ref 30.0–36.0)
MCV: 90 fL (ref 78.0–100.0)
PLATELETS: 176 10*3/uL (ref 150–400)
RBC: 4.6 MIL/uL (ref 4.22–5.81)
RDW: 14 % (ref 11.5–15.5)
WBC: 9.6 10*3/uL (ref 4.0–10.5)

## 2015-03-14 MED ORDER — SULFAMETHOXAZOLE-TRIMETHOPRIM 800-160 MG PO TABS: 1.0000 | ORAL_TABLET | Freq: Two times a day (BID) | ORAL | Status: DC

## 2015-03-14 MED ORDER — HYDROCODONE-ACETAMINOPHEN 5-325 MG PO TABS: 1.0000 | ORAL_TABLET | Freq: Four times a day (QID) | ORAL | Status: DC | PRN

## 2015-03-14 NOTE — Progress Notes (Signed)
General Surgery Note   POD -  1 Day Post-Op  Assessment/Plan: 1.  IRRIGATION AND DEBRIDEMENT ABSCESS, left perirectal - 03/13/2015 - Shawn Brooks  WBC - 9,600 - 03/14/2015  On no antibiotics  Plan:  Doing well and ready to go home.  Discharge instructions reviewed.  2.  HIV  Followed by Dr. Linus Salmons  3  DVT prophylaxis - SQ Heparin   Active Problems:   Perirectal abscess   Subjective:  Doing better.  In the room by himself.  Lives with his partner. Objective:   Filed Vitals:   03/14/15 0938  BP: 126/75  Pulse: 66  Temp: 97.9 F (36.6 C)  Resp: 18     Intake/Output from previous day:  09/15 0701 - 09/16 0700 In: 2270 [P.O.:720; I.V.:1550] Out: 1550 [Urine:1550]  Intake/Output this shift:      Physical Exam:   General: WN AA M who is alert and oriented.    HEENT: Normal. Pupils equal.   Abdomen: Soft   Wound: Wound clean.  I removed the packing.   Lab Results:    Recent Labs  03/13/15 0907 03/14/15 0518  WBC 11.8* 9.6  HGB 14.5 14.1  HCT 42.6 41.4  PLT 195 176    BMET   Recent Labs  03/13/15 0907 03/14/15 0518  NA 137 140  K 4.2 4.2  CL 106 106  CO2 24 28  GLUCOSE 98 99  BUN 11 13  CREATININE 1.37* 1.44*  CALCIUM 9.2 9.6    PT/INR  No results for input(s): LABPROT, INR in the last 72 hours.  ABG  No results for input(s): PHART, HCO3 in the last 72 hours.  Invalid input(s): PCO2, PO2   Studies/Results:  No results found.   Anti-infectives:   Anti-infectives    Start     Dose/Rate Route Frequency Ordered Stop   03/13/15 1800  Darunavir Ethanolate (PREZISTA) tablet 800 mg     800 mg Oral Daily with breakfast 03/13/15 1727     03/13/15 1800  elvitegravir-cobicistat-emtricitabine-tenofovir (GENVOYA) 150-150-200-10 MG tablet 1 tablet     1 tablet Oral Daily with breakfast 03/13/15 1727     03/13/15 1741  valACYclovir (VALTREX) tablet 500 mg     500 mg Oral 2 times daily PRN 03/13/15 1727     03/13/15 1400  [MAR Hold]  piperacillin-tazobactam  (ZOSYN) IVPB 3.375 g  Status:  Discontinued     (MAR Hold since 03/13/15 1445)   3.375 g 12.5 mL/hr over 240 Minutes Intravenous 3 times per day 03/13/15 1143 03/13/15 1727      Alphonsa Overall, MD, FACS Pager: [facility] Office: [phone] 03/14/2015

## 2015-03-14 NOTE — Discharge Summary (Signed)
Physician Discharge Summary  Patient ID:  Shawn Brooks  MRN: [id number]  DOB/AGE: 1976/06/06 39 y.o.  Admit date: 03/13/2015 Discharge date: 03/14/2015  Discharge Diagnoses:  1. Left perirectal abscess 2. HIV Followed by Dr. Linus Salmons   Active Problems:   Perirectal abscess  Operation: Procedure(s): IRRIGATION AND DEBRIDEMENT ABSCESS, left perirectal on 03/13/2015 [name]  Discharged Condition: good  Hospital Course: Shawn Brooks is an 39 y.o. male whose primary care physician is Shawn Bosch, NP and who was admitted 03/13/2015 with a chief complaint of  Chief Complaint  Patient presents with  . Abscess  .   He was brought to the operating room on 03/13/2015 and underwent  IRRIGATION AND DEBRIDEMENT ABSCESS, left perirectal.   He is now one day post op, afebrile, doing well, and ready to go home.  I removed the packing from the wound.  The discharge instructions were reviewed with the patient.  Consults: None  Significant Diagnostic Studies: Results for orders placed or performed during the hospital encounter of 03/13/15  Anaerobic culture  Result Value Ref Range   Specimen Description ABSCESS PERIRECTAL    Special Requests NONE    Gram Stain      ABUNDANT WBC PRESENT,BOTH PMN AND MONONUCLEAR NO SQUAMOUS EPITHELIAL CELLS SEEN MODERATE GRAM NEGATIVE RODS RARE GRAM POSITIVE COCCI IN PAIRS    Culture      NO ANAEROBES ISOLATED; CULTURE IN PROGRESS FOR 5 DAYS Performed at [facility]    Report Status PENDING   Culture, routine-abscess  Result Value Ref Range   Specimen Description ABSCESS PERIRECTAL    Special Requests NONE    Gram Stain      ABUNDANT WBC PRESENT,BOTH PMN AND MONONUCLEAR NO SQUAMOUS EPITHELIAL CELLS SEEN MODERATE GRAM NEGATIVE RODS RARE GRAM POSITIVE COCCI IN PAIRS    Culture PENDING    Report Status PENDING   Surgical pcr screen  Result Value Ref Range   MRSA, PCR NEGATIVE NEGATIVE   Staphylococcus aureus  NEGATIVE NEGATIVE  CBC with Differential  Result Value Ref Range   WBC 11.8 (H) 4.0 - 10.5 K/uL   RBC 4.70 4.22 - 5.81 MIL/uL   Hemoglobin 14.5 13.0 - 17.0 g/dL   HCT 42.6 39.0 - 52.0 %   MCV 90.6 78.0 - 100.0 fL   MCH 30.9 26.0 - 34.0 pg   MCHC 34.0 30.0 - 36.0 g/dL   RDW 14.0 11.5 - 15.5 %   Platelets 195 150 - 400 K/uL   Neutrophils Relative % 65 %   Neutro Abs 7.7 1.7 - 7.7 K/uL   Lymphocytes Relative 25 %   Lymphs Abs 3.0 0.7 - 4.0 K/uL   Monocytes Relative 9 %   Monocytes Absolute 1.0 0.1 - 1.0 K/uL   Eosinophils Relative 1 %   Eosinophils Absolute 0.1 0.0 - 0.7 K/uL   Basophils Relative 0 %   Basophils Absolute 0.0 0.0 - 0.1 K/uL  Basic metabolic panel  Result Value Ref Range   Sodium 137 135 - 145 mmol/L   Potassium 4.2 3.5 - 5.1 mmol/L   Chloride 106 101 - 111 mmol/L   CO2 24 22 - 32 mmol/L   Glucose, Bld 98 65 - 99 mg/dL   BUN 11 6 - 20 mg/dL   Creatinine, Ser 1.37 (H) 0.61 - 1.24 mg/dL   Calcium 9.2 8.9 - 10.3 mg/dL   GFR calc non Af Amer >60 >60 mL/min   GFR calc Af Amer >60 >60 mL/min  Anion gap 7 5 - 15  CBC  Result Value Ref Range   WBC 9.6 4.0 - 10.5 K/uL   RBC 4.60 4.22 - 5.81 MIL/uL   Hemoglobin 14.1 13.0 - 17.0 g/dL   HCT 41.4 39.0 - 52.0 %   MCV 90.0 78.0 - 100.0 fL   MCH 30.7 26.0 - 34.0 pg   MCHC 34.1 30.0 - 36.0 g/dL   RDW 14.0 11.5 - 15.5 %   Platelets 176 150 - 400 K/uL  Basic metabolic panel  Result Value Ref Range   Sodium 140 135 - 145 mmol/L   Potassium 4.2 3.5 - 5.1 mmol/L   Chloride 106 101 - 111 mmol/L   CO2 28 22 - 32 mmol/L   Glucose, Bld 99 65 - 99 mg/dL   BUN 13 6 - 20 mg/dL   Creatinine, Ser 1.44 (H) 0.61 - 1.24 mg/dL   Calcium 9.6 8.9 - 10.3 mg/dL   GFR calc non Af Amer 59 (L) >60 mL/min   GFR calc Af Amer >60 >60 mL/min   Anion gap 6 5 - 15    No results found.  Discharge Exam:  Filed Vitals:   03/14/15 0938  BP: 126/75  Pulse: 66  Temp: 97.9 F (36.6 C)  Resp: 18    General: WN AA M who is  alert. Abdomen: Soft. No mass. No tenderness.  Rectum:  I removed the packing from the wound.  It is clean.  Discharge Medications:     Medication List    TAKE these medications        acetaminophen 500 MG tablet  Commonly known as:  TYLENOL  Take 1 tablet (500 mg total) by mouth every 6 (six) hours as needed.     ARIPiprazole 5 MG tablet  Commonly known as:  ABILIFY  Take 5 mg by mouth daily.     cyclobenzaprine 10 MG tablet  Commonly known as:  FLEXERIL  Take 1 tablet (10 mg total) by mouth 2 (two) times daily as needed for muscle spasms.     Darunavir Ethanolate 800 MG tablet  Commonly known as:  PREZISTA  Take 1 tablet (800 mg total) by mouth daily.     elvitegravir-cobicistat-emtricitabine-tenofovir 150-150-200-10 MG Tabs tablet  Commonly known as:  GENVOYA  Take 1 tablet by mouth daily.     escitalopram 20 MG tablet  Commonly known as:  LEXAPRO  Take 1 tablet (20 mg total) by mouth daily.     HYDROcodone-acetaminophen 5-325 MG per tablet  Commonly known as:  NORCO/VICODIN  Take 1-2 tablets by mouth every 6 (six) hours as needed.     omeprazole 20 MG capsule  Commonly known as:  PRILOSEC  TAKE 1 CAPSULE BY MOUTH DAILY     sulfamethoxazole-trimethoprim 800-160 MG per tablet  Commonly known as:  BACTRIM DS,SEPTRA DS  Take 1 tablet by mouth 2 (two) times daily.     traMADol 50 MG tablet  Commonly known as:  ULTRAM  Take 1 tablet (50 mg total) by mouth every 12 (twelve) hours as needed.     traZODone 100 MG tablet  Commonly known as:  DESYREL  Take 100 mg by mouth at bedtime as needed for sleep.     valACYclovir 500 MG tablet  Commonly known as:  VALTREX  Take 1 tablet (500 mg total) by mouth 2 (two) times daily. Take by mouth. Take 500 mg by mouth 2 (two) times daily as needed. For flare-ups.  Disposition: 01-Home or Self Care      Discharge Instructions    Diet - low sodium heart healthy    Complete by:  As directed      Increase activity  slowly    Complete by:  As directed           Activity:  Driving - May drive when taking no pain meds and feeling okay   Lifting - No limit  Wound Care:   Sitz bath (soak in bathtub) 3 times a day until seen by Dr. [name]  Diet:  As tolerated  Follow up appointment:  Call Dr. Earlie Server office [facility]) at [phone] for an appointment in 2 to 4 weeks.  Medications and dosages:  Resume your home medications.  You have a prescription for:  vicodin and Bactrim  Signed: Alphonsa Overall, M.D., [facility] Office:  [phone]  03/14/2015, 11:20 AM

## 2015-03-14 NOTE — Anesthesia Postprocedure Evaluation (Signed)
  Anesthesia Post-op Note  Patient: Shawn Brooks  Procedure(s) Performed: Procedure(s) (LRB): IRRIGATION AND DEBRIDEMENT ABSCESS (N/A)  Patient Location: PACU  Anesthesia Type: General  Level of Consciousness: awake and alert   Airway and Oxygen Therapy: Patient Spontanous Breathing  Post-op Pain: mild  Post-op Assessment: Post-op Vital signs reviewed, Patient's Cardiovascular Status Stable, Respiratory Function Stable, Patent Airway and No signs of Nausea or vomiting  Last Vitals:  Filed Vitals:   03/14/15 0630  BP: 115/77  Pulse: 77  Temp: 36.9 C  Resp: 18    Post-op Vital Signs: stable   Complications: No apparent anesthesia complications

## 2015-03-14 NOTE — Discharge Instructions (Signed)
[facility] - DISCHARGE INSTRUCTIONS TO PATIENT  Activity:  Driving - May drive when taking no pain meds and feeling okay   Lifting - No limit  Wound Care:   Sitz bath (soak in bathtub) 3 times a day until seen by Dr. [name]  Diet:  As tolerated  Follow up appointment:  Call Dr. Earlie Server office [facility]) at [phone] for an appointment in 2 to 4 weeks.  Medications and dosages:  Resume your home medications.  You have a prescription for:  vicodin and Bactrim  Call Dr. [name] or his office  [phone]) if you have:  Temperature greater than 100.4,  Severe uncontrolled pain,  Redness, tenderness, or signs of infection (pain, swelling, redness, odor or green/yellow discharge around the site),  Any other questions or concerns you may have after discharge.  In an emergency, call 911 or go to an Emergency Department at a nearby hospital.

## 2015-03-14 NOTE — Progress Notes (Signed)
Discharge instructions given along with prescriptions.  Questions ansered

## 2015-03-17 ENCOUNTER — Ambulatory Visit: Payer: Self-pay

## 2015-03-17 LAB — CULTURE, ROUTINE-ABSCESS

## 2015-03-20 LAB — ANAEROBIC CULTURE

## 2015-04-14 ENCOUNTER — Ambulatory Visit: Payer: Self-pay

## 2015-04-28 ENCOUNTER — Other Ambulatory Visit: Payer: Self-pay

## 2015-04-28 DIAGNOSIS — Z113 Encounter for screening for infections with a predominantly sexual mode of transmission: Secondary | ICD-10-CM

## 2015-04-28 DIAGNOSIS — B20 Human immunodeficiency virus [HIV] disease: Secondary | ICD-10-CM

## 2015-04-28 LAB — CBC WITH DIFFERENTIAL/PLATELET
BASOS PCT: 0 % (ref 0–1)
Basophils Absolute: 0 10*3/uL (ref 0.0–0.1)
Eosinophils Absolute: 0.1 10*3/uL (ref 0.0–0.7)
Eosinophils Relative: 1 % (ref 0–5)
HCT: 45.9 % (ref 39.0–52.0)
HEMOGLOBIN: 15.7 g/dL (ref 13.0–17.0)
Lymphocytes Relative: 47 % — ABNORMAL HIGH (ref 12–46)
Lymphs Abs: 4.5 10*3/uL — ABNORMAL HIGH (ref 0.7–4.0)
MCH: 30.7 pg (ref 26.0–34.0)
MCHC: 34.2 g/dL (ref 30.0–36.0)
MCV: 89.8 fL (ref 78.0–100.0)
MPV: 10.5 fL (ref 8.6–12.4)
Monocytes Absolute: 0.9 10*3/uL (ref 0.1–1.0)
Monocytes Relative: 9 % (ref 3–12)
NEUTROS ABS: 4.1 10*3/uL (ref 1.7–7.7)
NEUTROS PCT: 43 % (ref 43–77)
Platelets: 218 10*3/uL (ref 150–400)
RBC: 5.11 MIL/uL (ref 4.22–5.81)
RDW: 14.2 % (ref 11.5–15.5)
WBC: 9.6 10*3/uL (ref 4.0–10.5)

## 2015-04-28 LAB — COMPLETE METABOLIC PANEL WITH GFR
ALBUMIN: 4.1 g/dL (ref 3.6–5.1)
ALK PHOS: 108 U/L (ref 40–115)
ALT: 22 U/L (ref 9–46)
AST: 23 U/L (ref 10–40)
BUN: 15 mg/dL (ref 7–25)
CALCIUM: 9.1 mg/dL (ref 8.6–10.3)
CO2: 25 mmol/L (ref 20–31)
Chloride: 104 mmol/L (ref 98–110)
Creat: 1.36 mg/dL — ABNORMAL HIGH (ref 0.60–1.35)
GFR, EST NON AFRICAN AMERICAN: 64 mL/min (ref >=60)
GFR, Est African American: 74 mL/min (ref >=60)
Glucose, Bld: 92 mg/dL (ref 65–99)
POTASSIUM: 4.3 mmol/L (ref 3.5–5.3)
SODIUM: 138 mmol/L (ref 135–146)
Total Bilirubin: 0.5 mg/dL (ref 0.2–1.2)
Total Protein: 7.1 g/dL (ref 6.1–8.1)

## 2015-04-29 ENCOUNTER — Other Ambulatory Visit: Payer: Self-pay

## 2015-04-29 LAB — URINE CYTOLOGY ANCILLARY ONLY
CHLAMYDIA, DNA PROBE: NEGATIVE
NEISSERIA GONORRHEA: NEGATIVE

## 2015-04-29 LAB — T-HELPER CELL (CD4) - (RCID CLINIC ONLY)
CD4 T CELL ABS: 1300 /uL (ref 400–2700)
CD4 T CELL HELPER: 27 % — AB (ref 33–55)

## 2015-04-29 LAB — HIV-1 RNA QUANT-NO REFLEX-BLD
HIV 1 RNA Quant: <20 copies/mL (ref <20)
HIV-1 RNA Quant, Log: <1.3 Log copies/mL (ref <1.30)

## 2015-05-05 ENCOUNTER — Other Ambulatory Visit: Payer: Self-pay | Admitting: Internal Medicine

## 2015-05-06 ENCOUNTER — Encounter: Payer: Self-pay | Admitting: Gastroenterology

## 2015-05-13 ENCOUNTER — Ambulatory Visit: Payer: Self-pay | Admitting: Internal Medicine

## 2015-05-16 ENCOUNTER — Other Ambulatory Visit: Payer: Self-pay | Admitting: *Deleted

## 2015-05-16 MED ORDER — ELVITEG-COBIC-EMTRICIT-TENOFAF 150-150-200-10 MG PO TABS: ORAL_TABLET | ORAL | Status: DC

## 2015-06-10 ENCOUNTER — Ambulatory Visit: Payer: Self-pay | Admitting: Internal Medicine

## 2015-07-01 ENCOUNTER — Other Ambulatory Visit: Payer: Self-pay | Admitting: Internal Medicine

## 2015-07-30 ENCOUNTER — Ambulatory Visit: Payer: Self-pay

## 2015-08-25 ENCOUNTER — Ambulatory Visit: Payer: Self-pay | Attending: Internal Medicine | Admitting: Internal Medicine

## 2015-08-25 ENCOUNTER — Encounter: Payer: Self-pay | Admitting: Internal Medicine

## 2015-08-25 VITALS — BP 121/80 | HR 73 | Temp 98.0°F | Resp 16 | Ht 68.0 in | Wt 242.0 lb

## 2015-08-25 DIAGNOSIS — F329 Major depressive disorder, single episode, unspecified: Secondary | ICD-10-CM | POA: Insufficient documentation

## 2015-08-25 DIAGNOSIS — R609 Edema, unspecified: Secondary | ICD-10-CM

## 2015-08-25 DIAGNOSIS — M7989 Other specified soft tissue disorders: Secondary | ICD-10-CM | POA: Insufficient documentation

## 2015-08-25 DIAGNOSIS — B351 Tinea unguium: Secondary | ICD-10-CM | POA: Insufficient documentation

## 2015-08-25 DIAGNOSIS — R600 Localized edema: Secondary | ICD-10-CM | POA: Insufficient documentation

## 2015-08-25 DIAGNOSIS — M1990 Unspecified osteoarthritis, unspecified site: Secondary | ICD-10-CM | POA: Insufficient documentation

## 2015-08-25 DIAGNOSIS — Z79899 Other long term (current) drug therapy: Secondary | ICD-10-CM | POA: Insufficient documentation

## 2015-08-25 DIAGNOSIS — F17210 Nicotine dependence, cigarettes, uncomplicated: Secondary | ICD-10-CM | POA: Insufficient documentation

## 2015-08-25 DIAGNOSIS — G8929 Other chronic pain: Secondary | ICD-10-CM | POA: Insufficient documentation

## 2015-08-25 LAB — COMPLETE METABOLIC PANEL WITH GFR
ALT: 19 U/L (ref 9–46)
AST: 21 U/L (ref 10–40)
Albumin: 4.1 g/dL (ref 3.6–5.1)
Alkaline Phosphatase: 93 U/L (ref 40–115)
BUN: 11 mg/dL (ref 7–25)
CHLORIDE: 105 mmol/L (ref 98–110)
CO2: 24 mmol/L (ref 20–31)
CREATININE: 1.37 mg/dL — AB (ref 0.60–1.35)
Calcium: 9.5 mg/dL (ref 8.6–10.3)
GFR, EST AFRICAN AMERICAN: 73 mL/min (ref >=60)
GFR, Est Non African American: 63 mL/min (ref >=60)
Glucose, Bld: 92 mg/dL (ref 65–99)
Potassium: 4.2 mmol/L (ref 3.5–5.3)
Sodium: 137 mmol/L (ref 135–146)
Total Bilirubin: 0.3 mg/dL (ref 0.2–1.2)
Total Protein: 7.4 g/dL (ref 6.1–8.1)

## 2015-08-25 LAB — CBC WITH DIFFERENTIAL/PLATELET
BASOS ABS: 0 10*3/uL (ref 0.0–0.1)
Basophils Relative: 0 % (ref 0–1)
EOS PCT: 1 % (ref 0–5)
Eosinophils Absolute: 0.1 10*3/uL (ref 0.0–0.7)
HCT: 45.6 % (ref 39.0–52.0)
Hemoglobin: 15.6 g/dL (ref 13.0–17.0)
LYMPHS ABS: 2.7 10*3/uL (ref 0.7–4.0)
LYMPHS PCT: 43 % (ref 12–46)
MCH: 30.6 pg (ref 26.0–34.0)
MCHC: 34.2 g/dL (ref 30.0–36.0)
MCV: 89.4 fL (ref 78.0–100.0)
MPV: 11 fL (ref 8.6–12.4)
Monocytes Absolute: 0.6 10*3/uL (ref 0.1–1.0)
Monocytes Relative: 9 % (ref 3–12)
Neutro Abs: 3 10*3/uL (ref 1.7–7.7)
Neutrophils Relative %: 47 % (ref 43–77)
PLATELETS: 234 10*3/uL (ref 150–400)
RBC: 5.1 MIL/uL (ref 4.22–5.81)
RDW: 14.4 % (ref 11.5–15.5)
WBC: 6.3 10*3/uL (ref 4.0–10.5)

## 2015-08-25 MED ORDER — FUROSEMIDE 20 MG PO TABS: 20.0000 mg | ORAL_TABLET | Freq: Every day | ORAL | Status: DC

## 2015-08-25 MED ORDER — POTASSIUM CHLORIDE ER 10 MEQ PO TBCR: 10.0000 meq | EXTENDED_RELEASE_TABLET | Freq: Every day | ORAL | Status: DC

## 2015-08-25 NOTE — Patient Instructions (Signed)
I have given you 10 days of pills. If no improvement in symptoms please call me back. I am trying to find a podiatrist to see you for the foot fungus. Do not forget to apply for [facility] discount. If your liver is normal---I may go ahead and treat for the foot fungus until you are seen by podiatry. I will call you and let you know.

## 2015-08-25 NOTE — Progress Notes (Signed)
Patient ID: Shawn Brooks, male   DOB: 07/31/1975, 40 y.o.   MRN: [id number]  CC: Feet Swelling   HPI: Shawn Brooks is a 40 y.o. male here today for a follow up visit.  Patient has past medical history of HIV, DJD. Patient reports that for the past 1 month he has been having complaints of bilateral feet swelling. Patient denies injury. He states that they are very painful and aggravated by walking. He has some warmth and redness to the area. He denies any chest pain or SOB. Pain is achy and constant.   No Known Allergies Past Medical History  Diagnosis Date  . HIV infection ([place])   . Blood dyscrasia     HIV  . Chronic back pain   . Depression   . Anxiety   . Headache(784.0)   . DJD (degenerative joint disease)    Current Outpatient Prescriptions on File Prior to Visit  Medication Sig Dispense Refill  . elvitegravir-cobicistat-emtricitabine-tenofovir (GENVOYA) 150-150-200-10 MG TABS tablet TAKE 1 TABLET BY MOUTH EVERY DAY WITH PREZISTA 30 tablet 4  . escitalopram (LEXAPRO) 20 MG tablet Take 1 tablet (20 mg total) by mouth daily. 30 tablet 6  . PREZISTA 800 MG tablet TAKE 1 TABLET BY MOUTH EVERY DAY 30 tablet 4  . valACYclovir (VALTREX) 500 MG tablet Take 1 tablet (500 mg total) by mouth 2 (two) times daily. Take by mouth. Take 500 mg by mouth 2 (two) times daily as needed. For flare-ups. 60 tablet 4  . acetaminophen (TYLENOL) 500 MG tablet Take 1 tablet (500 mg total) by mouth every 6 (six) hours as needed. (Patient not taking: Reported on 03/13/2015) 30 tablet 0  . ARIPiprazole (ABILIFY) 5 MG tablet Take 5 mg by mouth daily.    . cyclobenzaprine (FLEXERIL) 10 MG tablet Take 1 tablet (10 mg total) by mouth 2 (two) times daily as needed for muscle spasms. (Patient not taking: Reported on 03/13/2015) 60 tablet 1  . HYDROcodone-acetaminophen (NORCO/VICODIN) 5-325 MG per tablet Take 1-2 tablets by mouth every 6 (six) hours as needed. 30 tablet 0  . omeprazole (PRILOSEC) 20 MG capsule TAKE 1  CAPSULE BY MOUTH DAILY 30 capsule 4  . sulfamethoxazole-trimethoprim (BACTRIM DS,SEPTRA DS) 800-160 MG per tablet Take 1 tablet by mouth 2 (two) times daily. 14 tablet 0  . traMADol (ULTRAM) 50 MG tablet Take 1 tablet (50 mg total) by mouth every 12 (twelve) hours as needed. (Patient not taking: Reported on 03/13/2015) 60 tablet 1  . traZODone (DESYREL) 100 MG tablet Take 100 mg by mouth at bedtime as needed for sleep.   2   No current facility-administered medications on file prior to visit.   Family History  Problem Relation Age of Onset  . Cancer Mother     laryngeal  . Pneumonia Father   . Diabetes Father   . Hypertension Sister   . Crohn's disease Brother   . Crohn's disease Maternal Grandmother   . Cancer Maternal Grandmother     patient unsure of type  . Colon cancer Maternal Grandfather    Social History   Social History  . Marital Status: Divorced    Spouse Name: N/A  . Number of Children: 1  . Years of Education: N/A   Occupational History  . Disabled    Social History Main Topics  . Smoking status: Current Every Day Smoker -- 0.50 packs/day    Types: Cigarettes  . Smokeless tobacco: Never Used  . Alcohol Use: 1.2 oz/week  2 Standard drinks or equivalent per week     Comment: beer at times. not often  . Drug Use: 7.00 per week    Special: Marijuana     Comment: occ  . Sexual Activity:    Partners: Male     Comment: accepted condoms   Other Topics Concern  . Not on file   Social History Narrative    Review of Systems: Other than what is stated in HPI, all other systems are negative.   Objective:   Filed Vitals:   08/25/15 1100  BP: 121/80  Pulse: 73  Temp: 98 F (36.7 C)  Resp: 16    Physical Exam  Constitutional: He is oriented to person, place, and time.  Cardiovascular: Normal rate, regular rhythm and normal heart sounds.   Pulmonary/Chest: Effort normal and breath sounds normal.  Musculoskeletal: He exhibits edema (L>R 2+ edema). He  exhibits no tenderness (calf tenderness).  Neurological: He is alert and oriented to person, place, and time.  Skin: Skin is warm and dry. No rash noted. There is erythema (all nail beds).  Psychiatric: He has a normal mood and affect.     Lab Results  Component Value Date   WBC 9.6 04/28/2015   HGB 15.7 04/28/2015   HCT 45.9 04/28/2015   MCV 89.8 04/28/2015   PLT 218 04/28/2015   Lab Results  Component Value Date   CREATININE 1.36* 04/28/2015   BUN 15 04/28/2015   NA 138 04/28/2015   K 4.3 04/28/2015   CL 104 04/28/2015   CO2 25 04/28/2015    Lab Results  Component Value Date   HGBA1C 5.7* 11/16/2013   Lipid Panel     Component Value Date/Time   CHOL 237* 10/29/2014 1118   TRIG 129 10/29/2014 1118   HDL 38* 10/29/2014 1118   CHOLHDL 6.2 10/29/2014 1118   VLDL 26 10/29/2014 1118   LDLCALC 173* 10/29/2014 1118       Assessment and plan:   Shawn Brooks was seen today for foot swelling.  Diagnoses and all orders for this visit:  Peripheral edema -     COMPLETE METABOLIC PANEL WITH GFR -     furosemide (LASIX) 20 MG tablet; Take 1 tablet (20 mg total) by mouth daily. -     potassium chloride (K-DUR) 10 MEQ tablet; Take 1 tablet (10 mEq total) by mouth daily. Unsure of etiology of pain, discussed decreasing salt intake. Patient did have redness of toe nail beds, sure if it is related to onychomycosis infection. Return precautions discussed.   Onychomycosis -     Ambulatory referral to Podiatry If LFT's are normal I will treat patient with oral Terbinafine but he still needs to consult podiatry. May need nail removal  Long term use of drug -     CBC with Differential Patient is on [place]. I found that this may cause neutropenia and leukopenia. I will assess labs today.       Return if symptoms worsen or fail to improve.   Lance Bosch, [facility] [phone] 08/25/2015, 11:32 AM

## 2015-08-25 NOTE — Progress Notes (Signed)
Patient complains of bilateral feet swelling for the Past month

## 2015-09-01 ENCOUNTER — Telehealth: Payer: Self-pay

## 2015-09-01 NOTE — Telephone Encounter (Signed)
-----   Message from Lance Bosch, NP sent at 09/01/2015 12:50 PM EST ----- Kidney function is still the same. Slightly elevated. Make sure he is not taking NSAID's which can cause more damage

## 2015-09-01 NOTE — Telephone Encounter (Signed)
Spoke with patient and he is aware of his lab results 

## 2015-09-08 ENCOUNTER — Ambulatory Visit: Payer: Self-pay

## 2015-09-15 ENCOUNTER — Ambulatory Visit: Payer: Self-pay

## 2015-09-26 ENCOUNTER — Ambulatory Visit: Payer: Self-pay | Admitting: Internal Medicine

## 2015-09-26 ENCOUNTER — Other Ambulatory Visit: Payer: Self-pay | Admitting: Internal Medicine

## 2015-09-29 ENCOUNTER — Emergency Department (HOSPITAL_COMMUNITY): Payer: Self-pay

## 2015-09-29 ENCOUNTER — Encounter (HOSPITAL_COMMUNITY): Payer: Self-pay | Admitting: Emergency Medicine

## 2015-09-29 ENCOUNTER — Emergency Department (HOSPITAL_COMMUNITY)
Admission: EM | Admit: 2015-09-29 | Discharge: 2015-09-30 | Disposition: A | Discharge status: Home / Self Care | Payer: Self-pay | Attending: Emergency Medicine | Admitting: Emergency Medicine

## 2015-09-29 DIAGNOSIS — Z79899 Other long term (current) drug therapy: Secondary | ICD-10-CM | POA: Insufficient documentation

## 2015-09-29 DIAGNOSIS — Y9389 Activity, other specified: Secondary | ICD-10-CM | POA: Insufficient documentation

## 2015-09-29 DIAGNOSIS — S32009A Unspecified fracture of unspecified lumbar vertebra, initial encounter for closed fracture: Secondary | ICD-10-CM

## 2015-09-29 DIAGNOSIS — M545 Low back pain, unspecified: Secondary | ICD-10-CM

## 2015-09-29 DIAGNOSIS — Y9289 Other specified places as the place of occurrence of the external cause: Secondary | ICD-10-CM | POA: Insufficient documentation

## 2015-09-29 DIAGNOSIS — W19XXXA Unspecified fall, initial encounter: Secondary | ICD-10-CM

## 2015-09-29 DIAGNOSIS — B20 Human immunodeficiency virus [HIV] disease: Secondary | ICD-10-CM | POA: Insufficient documentation

## 2015-09-29 DIAGNOSIS — Z792 Long term (current) use of antibiotics: Secondary | ICD-10-CM | POA: Insufficient documentation

## 2015-09-29 DIAGNOSIS — F419 Anxiety disorder, unspecified: Secondary | ICD-10-CM | POA: Insufficient documentation

## 2015-09-29 DIAGNOSIS — S32028A Other fracture of second lumbar vertebra, initial encounter for closed fracture: Secondary | ICD-10-CM | POA: Insufficient documentation

## 2015-09-29 DIAGNOSIS — S30810A Abrasion of lower back and pelvis, initial encounter: Secondary | ICD-10-CM | POA: Insufficient documentation

## 2015-09-29 DIAGNOSIS — W108XXA Fall (on) (from) other stairs and steps, initial encounter: Secondary | ICD-10-CM | POA: Insufficient documentation

## 2015-09-29 DIAGNOSIS — G8929 Other chronic pain: Secondary | ICD-10-CM | POA: Insufficient documentation

## 2015-09-29 DIAGNOSIS — F17210 Nicotine dependence, cigarettes, uncomplicated: Secondary | ICD-10-CM | POA: Insufficient documentation

## 2015-09-29 DIAGNOSIS — Y998 Other external cause status: Secondary | ICD-10-CM | POA: Insufficient documentation

## 2015-09-29 DIAGNOSIS — F329 Major depressive disorder, single episode, unspecified: Secondary | ICD-10-CM | POA: Insufficient documentation

## 2015-09-29 DIAGNOSIS — M1990 Unspecified osteoarthritis, unspecified site: Secondary | ICD-10-CM | POA: Insufficient documentation

## 2015-09-29 MED ORDER — OXYCODONE-ACETAMINOPHEN 5-325 MG PO TABS: 1.0000 | ORAL_TABLET | Freq: Four times a day (QID) | ORAL | Status: DC | PRN

## 2015-09-29 MED ORDER — METHOCARBAMOL 500 MG PO TABS: 500.0000 mg | ORAL_TABLET | Freq: Two times a day (BID) | ORAL | Status: DC

## 2015-09-29 MED ORDER — METHOCARBAMOL 500 MG PO TABS
500.0000 mg | ORAL_TABLET | Freq: Once | ORAL | Status: AC
  Administered 2015-09-29: 500 mg via ORAL
  Filled 2015-09-29: qty 1

## 2015-09-29 MED ORDER — KETOROLAC TROMETHAMINE 60 MG/2ML IM SOLN
60.0000 mg | Freq: Once | INTRAMUSCULAR | Status: AC
  Administered 2015-09-29: 60 mg via INTRAMUSCULAR
  Filled 2015-09-29: qty 2

## 2015-09-29 MED ORDER — HYDROMORPHONE HCL 1 MG/ML IJ SOLN
1.0000 mg | Freq: Once | INTRAMUSCULAR | Status: AC
  Administered 2015-09-29: 1 mg via INTRAMUSCULAR
  Filled 2015-09-29: qty 1

## 2015-09-29 MED ORDER — PREDNISONE 20 MG PO TABS: 40.0000 mg | ORAL_TABLET | Freq: Every day | ORAL | Status: DC

## 2015-09-29 NOTE — ED Notes (Signed)
Ortho tech at bedside to apply back brace.

## 2015-09-29 NOTE — ED Notes (Signed)
Awaiting brace to be applied by ortho tech. Ortho tech advised it would take appx 1 hour for brace to arrive.

## 2015-09-29 NOTE — ED Provider Notes (Signed)
CSN: [id number]     Arrival date & time 09/29/15  2117 History  By signing my name below, I, Hansel Feinstein, attest that this documentation has been prepared under the direction and in the presence of [employer], PA-C. Electronically Signed: Hansel Feinstein, ED Scribe. 09/29/2015. 9:31 PM.     Chief Complaint  Patient presents with  . Back Pain    The history is provided by the patient. No language interpreter was used.     HPI Comments: Shawn Brooks is a 40 y.o. male with h/o chronic back pain managed by Dr. Feliciana Rossetti who presents to the Emergency Department complaining of moderate, throbbing mid back pain s/p mechanical fall 30 minutes ago. Pt states that he slipped on a puddle of water and fell backward onto 3 concrete steps, landing on his mid back. Denies LOC, head injury, additional injuries. Pt denies taking OTC medications at home to improve symptoms. Pt is ambulatory without difficulty. He reports that pain is worsened with movement and ambulation. Denies bowel or bladder incontinence, saddle anesthesia, abdominal pain, dysuria, hematuria, frequency, nausea, emesis. Denies numbness, focal weakness or paresthesia of the lower extremities.     Past Medical History  Diagnosis Date  . HIV infection ([place])   . Blood dyscrasia     HIV  . Chronic back pain   . Depression   . Anxiety   . Headache(784.0)   . DJD (degenerative joint disease)    Past Surgical History  Procedure Laterality Date  . Wisdom tooth extraction    . Esophagogastroduodenoscopy  03/27/2012    Procedure: ESOPHAGOGASTRODUODENOSCOPY (EGD);  Surgeon: Lear Ng, MD;  Location: [facility];  Service: Endoscopy;  Laterality: N/A;  . Irrigation and debridement abscess N/A 03/13/2015    Procedure: IRRIGATION AND DEBRIDEMENT ABSCESS;  Surgeon: Johnathan Hausen, MD;  Location: [facility];  Service: General;  Laterality: N/A;   Family History  Problem Relation Age of Onset  . Cancer Mother     laryngeal  . Pneumonia Father    . Diabetes Father   . Hypertension Sister   . Crohn's disease Brother   . Crohn's disease Maternal Grandmother   . Cancer Maternal Grandmother     patient unsure of type  . Colon cancer Maternal Grandfather    Social History  Substance Use Topics  . Smoking status: Current Every Day Smoker -- 0.50 packs/day    Types: Cigarettes  . Smokeless tobacco: Never Used  . Alcohol Use: 1.2 oz/week    2 Standard drinks or equivalent per week     Comment: beer at times. not often    Review of Systems  Gastrointestinal: Negative for nausea, vomiting and abdominal pain.  Genitourinary: Negative for urgency, frequency and hematuria.  Musculoskeletal: Positive for back pain.  Neurological: Negative for syncope, weakness and numbness.  All other systems reviewed and are negative.    Allergies  Review of patient's allergies indicates no known allergies.  Home Medications   Prior to Admission medications   Medication Sig Start Date End Date Taking? Authorizing Provider  acetaminophen (TYLENOL) 500 MG tablet Take 1 tablet (500 mg total) by mouth every 6 (six) hours as needed. Patient not taking: Reported on 03/13/2015 01/18/14   Tresa Garter, MD  ARIPiprazole (ABILIFY) 5 MG tablet Take 5 mg by mouth daily.    Historical Provider, MD  cyclobenzaprine (FLEXERIL) 10 MG tablet Take 1 tablet (10 mg total) by mouth 2 (two) times daily as needed for muscle spasms. Patient not  taking: Reported on 03/13/2015 06/12/14   Lance Bosch, NP  elvitegravir-cobicistat-emtricitabine-tenofovir (GENVOYA) 150-150-200-10 MG TABS tablet TAKE 1 TABLET BY MOUTH EVERY DAY WITH PREZISTA 05/16/15   Thayer Headings, MD  escitalopram (LEXAPRO) 20 MG tablet Take 1 tablet (20 mg total) by mouth daily. 10/18/13   Thayer Headings, MD  furosemide (LASIX) 20 MG tablet Take 1 tablet (20 mg total) by mouth daily. 08/25/15   Lance Bosch, NP  GENVOYA 150-150-200-10 MG TABS tablet TAKE 1 TABLET BY MOUTH EVERY DAY WITH PREZISTA  09/26/15   Thayer Headings, MD  methocarbamol (ROBAXIN) 500 MG tablet Take 1 tablet (500 mg total) by mouth 2 (two) times daily. 09/29/15   Antonietta Breach, PA-C  omeprazole (PRILOSEC) 20 MG capsule TAKE 1 CAPSULE BY MOUTH DAILY 07/02/15   Thayer Headings, MD  oxyCODONE-acetaminophen (PERCOCET/ROXICET) 5-325 MG tablet Take 1-2 tablets by mouth every 6 (six) hours as needed for severe pain. 09/29/15   Antonietta Breach, PA-C  potassium chloride (K-DUR) 10 MEQ tablet Take 1 tablet (10 mEq total) by mouth daily. 08/25/15   Lance Bosch, NP  predniSONE (DELTASONE) 20 MG tablet Take 2 tablets (40 mg total) by mouth daily. 09/29/15   Antonietta Breach, PA-C  PREZISTA 800 MG tablet TAKE 1 TABLET BY MOUTH EVERY DAY 09/26/15   Thayer Headings, MD  sulfamethoxazole-trimethoprim (BACTRIM DS,SEPTRA DS) 800-160 MG per tablet Take 1 tablet by mouth 2 (two) times daily. 03/14/15   Alphonsa Overall, MD  traZODone (DESYREL) 100 MG tablet Take 100 mg by mouth at bedtime as needed for sleep.  07/24/14   Historical Provider, MD  valACYclovir (VALTREX) 500 MG tablet Take 1 tablet (500 mg total) by mouth 2 (two) times daily. Take by mouth. Take 500 mg by mouth 2 (two) times daily as needed. For flare-ups. 10/08/14   Thayer Headings, MD   BP 136/79 mmHg  Pulse 98  Temp(Src) 98.4 F (36.9 C) (Oral)  Resp 18  Ht 5\' 8"  (1.727 m)  Wt 111.131 kg  BMI 37.26 kg/m2  SpO2 97%   Physical Exam  Constitutional: He is oriented to person, place, and time. He appears well-developed and well-nourished. No distress.  Nontoxic/nonseptic appearing  HENT:  Head: Normocephalic and atraumatic.  Eyes: Conjunctivae and EOM are normal. No scleral icterus.  Neck: Normal range of motion.  Pulmonary/Chest: Effort normal. No respiratory distress.  Respirations even and unlabored  Musculoskeletal: Normal range of motion. He exhibits tenderness.       Thoracic back: Normal.       Lumbar back: He exhibits tenderness, swelling (mild, associated with abrasions) and pain. He  exhibits no deformity and no spasm.       Back:  Neurological: He is alert and oriented to person, place, and time. He exhibits normal muscle tone. Coordination normal.  Sensation to light touch intact in BLE. Patient ambulatory with slow, steady gait.  Skin: Skin is warm and dry. No rash noted. He is not diaphoretic. No erythema. No pallor.  Superficial abrasions to L low back and along lower midline.  Psychiatric: He has a normal mood and affect. His behavior is normal.  Nursing note and vitals reviewed.   ED Course  Procedures (including critical care time)  DIAGNOSTIC STUDIES: Oxygen Saturation is 97% on RA, normal by my interpretation.    COORDINATION OF CARE: 9:31 PM Discussed treatment plan with pt at bedside which includes XR and pt agreed to plan.   Imaging Review Dg Lumbar  Spine Complete  09/29/2015  CLINICAL DATA:  Fall down 3 steps this evening, striking left-side of back. Left lower back pain radiating down left leg. EXAM: LUMBAR SPINE - COMPLETE 4+ VIEW COMPARISON:  Lumbar spine MRI 07/19/2012 FINDINGS: Minimally displaced left transverse process fracture at L2. No additional fracture. The alignment is maintained. Vertebral body heights are normal. There is no listhesis. Disc spaces are preserved. Sacroiliac joints are symmetric and normal. IMPRESSION: Minimally displaced left transverse process fracture at L2. Electronically Signed   By: Jeb Levering M.D.   On: 09/29/2015 22:20     I have personally reviewed and evaluated these images as part of my medical decision-making.   MDM   Final diagnoses:  Lumbar transverse process fracture, closed, initial encounter ([place])  Midline low back pain without sciatica  Fall, initial encounter    40 y/o male presents for back pain after a fall. Xray shows an acute L2 transverse process fracture. Patient placed in TLSO brace. He is neurovascularly intact and ambulatory. No red flags or signs concerning for cauda equina. Will  refer to neurosurgery for follow up. Return precautions discussed and provided. Patient discharged in good condition with no unaddressed concerns.  I personally performed the services described in this documentation, which was scribed in my presence. The recorded information has been reviewed and is accurate.    Filed Vitals:   09/29/15 2128 09/29/15 2129  BP:  136/79  Pulse:  98  Temp:  98.4 F (36.9 C)  TempSrc:  Oral  Resp:  18  Height: 5\' 8"  (1.727 m)   Weight: 111.131 kg   SpO2:  97%     Antonietta Breach, PA-C 09/29/15 2258  Forde Dandy, MD 09/30/15 0045

## 2015-09-29 NOTE — Discharge Instructions (Signed)
Wear a back brace at all times, though you may remove it when sleeping if desired. Take prednisone as prescribed and robaxin for muscle spasms. Take Percocet for pain as needed. Apply ice to your back 3-4 times per day. Avoid strenuous activity or heavy lifting. Follow up with a neurosurgeon as soon as you are able. Return to the ED if symptoms worsen.  Transverse Process Fracture Each bone of the spine (vertebra) has portions of bone that extend off to either side of the spine. These portions of bone are called transverse processes. A transverse process fracture, which is also called a rotation spine fracture, is a break in a transverse process. CAUSES This condition may be caused by:  A fall from a height.  A car accident.  A sports injury.  A gunshot wound.  A hard, direct hit to the back. This kind of fracture often results from a sudden and severe bending of the spine to one side. RISK FACTORS This condition is more likely to develop in:  People who have thinning and loss of density in the bones (osteoporosis).  People who play a contact sport. SYMPTOMS The main symptom of this condition is back pain. The pain may be felt on the side of the spine (flank) where the fracture is. It may get worse when you move or take deep a deep breath. DIAGNOSIS This condition may be diagnosed based on symptoms, a medical history, and a physical exam. During the physical exam, your health care provider may tap along the length of your spine to see where you feel pain. Imaging tests may be done to confirm the diagnosis. They may include:  X-rays.  A CT scan.  MRI. TREATMENT Most transverse process fractures heal on their own with time and with rest. Treatment may involve supportive care, such as:  A back brace.  Activity limits.  Pain medicine.  Muscle-relaxing medicine.  Physical therapy. HOME CARE INSTRUCTIONS General Instructions  Take medicines only as directed by your health  care provider.  Do not drive or operate heavy machinery while taking pain medicine.  Wear your neck or back brace as directed by your health care provider.  Keep all follow-up visits as directed by your health care provider. This is important. It can help to prevent permanent injury, disability, and long-lasting (chronic) pain. Activity  Stay in bed (on bed rest) only as directed by your health care provider. Being on bed rest for too long can make your condition worse.  Return to your normal activities when your health care provider says it is okay. Ask if there are any activities that you should not do.  Do your physical therapy as recommended by your health care provider. SEEK MEDICAL CARE IF:  You have a fever.  You develop a cough that makes your pain worse.  Your pain medicine is not helping.  Your pain does not get better over time.  You cannot return to your normal activities as planned or expected. SEEK IMMEDIATE MEDICAL CARE IF:  Your pain is very bad and it suddenly gets worse.  You are unable to move any body part (paralysis) that is below the level of your injury.  You have numbness, tingling, or weakness in any body part that is below the level of your injury.  You cannot control your bladder or bowels.   This information is not intended to replace advice given to you by your health care provider. Make sure you discuss any questions you have with  your health care provider.   Document Released: 09/29/2006 Document Revised: 10/29/2014 Document Reviewed: 06/18/2014 Elsevier Interactive Patient Education [employer].

## 2015-09-29 NOTE — ED Notes (Signed)
Pt c/o low back pain following a fall 30 min ago down 3 concrete steps, pt states he slipped on water.

## 2015-09-29 NOTE — ED Notes (Signed)
Ortho tech paged for brace.

## 2015-09-30 NOTE — ED Notes (Signed)
Pt reports understanding of discharge information. No questions at time of discharge 

## 2015-10-02 ENCOUNTER — Ambulatory Visit (INDEPENDENT_AMBULATORY_CARE_PROVIDER_SITE_OTHER): Payer: Self-pay | Admitting: Internal Medicine

## 2015-10-02 ENCOUNTER — Encounter: Payer: Self-pay | Admitting: Internal Medicine

## 2015-10-02 VITALS — BP 133/84 | HR 77 | Temp 98.3°F | Ht 68.75 in | Wt 240.0 lb

## 2015-10-02 DIAGNOSIS — Z23 Encounter for immunization: Secondary | ICD-10-CM

## 2015-10-02 DIAGNOSIS — Z113 Encounter for screening for infections with a predominantly sexual mode of transmission: Secondary | ICD-10-CM

## 2015-10-02 DIAGNOSIS — D013 Carcinoma in situ of anus and anal canal: Secondary | ICD-10-CM

## 2015-10-02 DIAGNOSIS — B20 Human immunodeficiency virus [HIV] disease: Secondary | ICD-10-CM

## 2015-10-02 DIAGNOSIS — A630 Anogenital (venereal) warts: Secondary | ICD-10-CM

## 2015-10-02 DIAGNOSIS — S32008A Other fracture of unspecified lumbar vertebra, initial encounter for closed fracture: Secondary | ICD-10-CM

## 2015-10-02 DIAGNOSIS — K6282 Dysplasia of anus: Secondary | ICD-10-CM

## 2015-10-02 DIAGNOSIS — S32009A Unspecified fracture of unspecified lumbar vertebra, initial encounter for closed fracture: Secondary | ICD-10-CM | POA: Insufficient documentation

## 2015-10-02 NOTE — Assessment & Plan Note (Signed)
Seems to be doing really well on treatment.  Labs today and can rtc 6 months unless concerns.

## 2015-10-02 NOTE — Progress Notes (Signed)
CC: Follow up for HIV  Interval history: Currently is asymptomatic and well-controlled on Genvoya and Prezista.  He has not been here in almost a year but since last visit he has been taking his medications well.  Has no associated weight loss.  Denies missed doses.     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  He doesn't remember the last time he saw surgery but thinks everything was ok.   [place] and broke his L2 transverse process, minimally displaced.  Went to ED and has a brace.    Prior to Admission medications   Medication Sig Start Date End Date Taking? Authorizing Provider  acetaminophen (TYLENOL) 500 MG tablet Take 1 tablet (500 mg total) by mouth every 6 (six) hours as needed. Patient not taking: Reported on 03/13/2015 01/18/14   Tresa Garter, MD  ARIPiprazole (ABILIFY) 5 MG tablet Take 5 mg by mouth daily.    Historical Provider, MD  elvitegravir-cobicistat-emtricitabine-tenofovir (GENVOYA) 150-150-200-10 MG TABS tablet TAKE 1 TABLET BY MOUTH EVERY DAY WITH PREZISTA 05/16/15   Thayer Headings, MD  escitalopram (LEXAPRO) 20 MG tablet Take 1 tablet (20 mg total) by mouth daily. 10/18/13   Thayer Headings, MD  furosemide (LASIX) 20 MG tablet Take 1 tablet (20 mg total) by mouth daily. 08/25/15   Lance Bosch, NP  methocarbamol (ROBAXIN) 500 MG tablet Take 1 tablet (500 mg total) by mouth 2 (two) times daily. 09/29/15   Antonietta Breach, PA-C  omeprazole (PRILOSEC) 20 MG capsule TAKE 1 CAPSULE BY MOUTH DAILY 07/02/15   Thayer Headings, MD  oxyCODONE-acetaminophen (PERCOCET/ROXICET) 5-325 MG tablet Take 1-2 tablets by mouth every 6 (six) hours as needed for severe pain. 09/29/15   Antonietta Breach, PA-C  potassium chloride (K-DUR) 10 MEQ tablet Take 1 tablet (10 mEq total) by mouth daily. 08/25/15   Lance Bosch, NP  predniSONE (DELTASONE) 20 MG tablet Take 2 tablets (40 mg total) by mouth daily. 09/29/15   Antonietta Breach, PA-C  PREZISTA 800 MG tablet TAKE 1 TABLET BY MOUTH EVERY DAY 09/26/15   Thayer Headings, MD  sulfamethoxazole-trimethoprim (BACTRIM DS,SEPTRA DS) 800-160 MG per tablet Take 1 tablet by mouth 2 (two) times daily. 03/14/15   Alphonsa Overall, MD  traZODone (DESYREL) 100 MG tablet Take 100 mg by mouth at bedtime as needed for sleep.  07/24/14   Historical Provider, MD  valACYclovir (VALTREX) 500 MG tablet Take 1 tablet (500 mg total) by mouth 2 (two) times daily. Take by mouth. Take 500 mg by mouth 2 (two) times daily as needed. For flare-ups. 10/08/14   Thayer Headings, MD    Review of Systems Constitutional: negative for fevers and chills Gastrointestinal: negative for nausea and diarrhea All other systems reviewed and are negative   Physical Exam: CONSTITUTIONAL:in no apparent distress and alert  Filed Vitals:   10/02/15 1001  BP: 133/84  Pulse: 77  Temp: 98.3 F (36.8 C)   Eyes: anicteric HENT: no thrush, no cervical lymphadenopathy Respiratory: Normal respiratory effort; CTA B BACK: in a brace  Lab Results  Component Value Date   HIV1RNAQUANT <20 04/28/2015   HIV1RNAQUANT 35* 10/29/2014   HIV1RNAQUANT 170* 07/30/2014   No components found for: HIV1GENOTYPRPLUS No components found for: THELPERCELL

## 2015-10-02 NOTE — Progress Notes (Signed)
Patient ID: Shawn Brooks, male   DOB: Jul 24, 1975, 40 y.o.   MRN: [id number]  HPI: Shawn Brooks is a 40 y.o. male who presents for a follow up visit. He only complains of pain from a recent fall that resulted in a L2 transverse process fracture. He mentions he recently stopped his Latuda because his PCP notified him of a drug interaction with his HIV medications. He reports that he is doing well off the of the [place].  Allergies: No Known Allergies  Vitals: Temp: 98.3 F (36.8 C) (04/06 1001) Temp Source: Oral (04/06 1001) BP: 133/84 mmHg (04/06 1001) Pulse Rate: 77 (04/06 1001)  Past Medical History: Past Medical History  Diagnosis Date  . HIV infection ([place])   . Blood dyscrasia     HIV  . Chronic back pain   . Depression   . Anxiety   . Headache(784.0)   . DJD (degenerative joint disease)     Social History: Social History   Social History  . Marital Status: Divorced    Spouse Name: N/A  . Number of Children: 1  . Years of Education: N/A   Occupational History  . Disabled    Social History Main Topics  . Smoking status: Current Every Day Smoker -- 0.50 packs/day    Types: Cigarettes  . Smokeless tobacco: Never Used  . Alcohol Use: 1.2 oz/week    2 Standard drinks or equivalent per week     Comment: beer at times. not often  . Drug Use: 7.00 per week    Special: Marijuana     Comment: occ  . Sexual Activity:    Partners: Male     Comment: accepted condoms   Other Topics Concern  . None   Social History Narrative    Previous Regimen: Genvoya + Prezista  Current Regimen: Continue previous regimen.  Labs: HIV 1 RNA QUANT (copies/mL)  Date Value  04/28/2015 <20  10/29/2014 35*  07/30/2014 170*   CD4 T CELL ABS (/uL)  Date Value  04/28/2015 1300  10/29/2014 1180  07/30/2014 1060   HEP B S AB (no units)  Date Value  05/06/2010 NEG   HEPATITIS B SURFACE AG (no units)  Date Value  05/06/2010 NEG   HCV AB (no units)  Date Value   05/06/2010 NEGATIVE    CrCl: CrCl cannot be calculated (Patient has no serum creatinine result on file.).  Lipids:    Component Value Date/Time   CHOL 237* 10/29/2014 1118   TRIG 129 10/29/2014 1118   HDL 38* 10/29/2014 1118   CHOLHDL 6.2 10/29/2014 1118   VLDL 26 10/29/2014 1118   LDLCALC 173* 10/29/2014 1118    Assessment: Mr. Hupe viral load at last visit was undetectable with a CD4 count of 1300. A significant interaction exists between [place] and Prezista and concomitant use is contraindicated.  Recommendations: Continue Genvoya + Prezista PO daily Follow up labs to ensure VL remains undetectable Reinforce that it is correct to not take Latuda at this time   Joya San, PharmD Clinical Pharmacy Resident Pager # [phone] 10/02/2015 10:27 AM

## 2015-10-02 NOTE — Addendum Note (Signed)
Addended by: Landis Gandy on: 10/02/2015 05:13 PM   Modules accepted: Orders

## 2015-10-02 NOTE — Assessment & Plan Note (Signed)
Followed by surgery and no issues at this time by his report.

## 2015-10-02 NOTE — Assessment & Plan Note (Signed)
I will send him to neurosurgery to see if any surgery is indicated or other therapy.

## 2015-10-03 LAB — URINE CYTOLOGY ANCILLARY ONLY
Chlamydia: NEGATIVE
Neisseria Gonorrhea: NEGATIVE

## 2015-10-03 LAB — HIV-1 RNA QUANT-NO REFLEX-BLD: HIV 1 RNA Quant: <20 copies/mL (ref <20)

## 2015-10-03 LAB — RPR

## 2015-10-03 LAB — T-HELPER CELL (CD4) - (RCID CLINIC ONLY)
CD4 % Helper T Cell: 44 % (ref 33–55)
CD4 T CELL ABS: 840 /uL (ref 400–2700)

## 2015-10-08 ENCOUNTER — Telehealth: Payer: Self-pay | Admitting: *Deleted

## 2015-10-08 NOTE — Telephone Encounter (Signed)
Referral sent to [facility]) for neurosurgery appointment. Landis Gandy, RN

## 2015-10-13 ENCOUNTER — Encounter: Payer: Self-pay | Admitting: Internal Medicine

## 2015-10-13 ENCOUNTER — Ambulatory Visit (HOSPITAL_COMMUNITY)
Admission: RE | Admit: 2015-10-13 | Discharge: 2015-10-13 | Disposition: A | Discharge status: Home / Self Care | Payer: Self-pay | Source: Ambulatory Visit | Attending: Internal Medicine | Admitting: Internal Medicine

## 2015-10-13 ENCOUNTER — Ambulatory Visit: Payer: Self-pay | Attending: Internal Medicine | Admitting: Internal Medicine

## 2015-10-13 VITALS — BP 103/66 | HR 73 | Temp 98.0°F | Resp 18 | Ht 68.0 in | Wt 235.4 lb

## 2015-10-13 DIAGNOSIS — F329 Major depressive disorder, single episode, unspecified: Secondary | ICD-10-CM | POA: Insufficient documentation

## 2015-10-13 DIAGNOSIS — B20 Human immunodeficiency virus [HIV] disease: Secondary | ICD-10-CM | POA: Insufficient documentation

## 2015-10-13 DIAGNOSIS — G8929 Other chronic pain: Secondary | ICD-10-CM | POA: Insufficient documentation

## 2015-10-13 DIAGNOSIS — I509 Heart failure, unspecified: Secondary | ICD-10-CM | POA: Insufficient documentation

## 2015-10-13 DIAGNOSIS — F419 Anxiety disorder, unspecified: Secondary | ICD-10-CM | POA: Insufficient documentation

## 2015-10-13 DIAGNOSIS — R600 Localized edema: Secondary | ICD-10-CM | POA: Insufficient documentation

## 2015-10-13 DIAGNOSIS — Z79899 Other long term (current) drug therapy: Secondary | ICD-10-CM | POA: Insufficient documentation

## 2015-10-13 DIAGNOSIS — S32029A Unspecified fracture of second lumbar vertebra, initial encounter for closed fracture: Secondary | ICD-10-CM | POA: Insufficient documentation

## 2015-10-13 DIAGNOSIS — M7989 Other specified soft tissue disorders: Secondary | ICD-10-CM | POA: Insufficient documentation

## 2015-10-13 LAB — BASIC METABOLIC PANEL
BUN: 14 mg/dL (ref 7–25)
CHLORIDE: 102 mmol/L (ref 98–110)
CO2: 25 mmol/L (ref 20–31)
CREATININE: 1.34 mg/dL (ref 0.60–1.35)
Calcium: 9.4 mg/dL (ref 8.6–10.3)
GLUCOSE: 77 mg/dL (ref 65–99)
Potassium: 4.3 mmol/L (ref 3.5–5.3)
Sodium: 137 mmol/L (ref 135–146)

## 2015-10-13 MED ORDER — POTASSIUM CHLORIDE CRYS ER 20 MEQ PO TBCR: 20.0000 meq | EXTENDED_RELEASE_TABLET | Freq: Every day | ORAL | Status: DC

## 2015-10-13 MED ORDER — FUROSEMIDE 40 MG PO TABS: 40.0000 mg | ORAL_TABLET | Freq: Every day | ORAL | Status: DC

## 2015-10-13 NOTE — Progress Notes (Signed)
VASCULAR LAB PRELIMINARY  PRELIMINARY  PRELIMINARY  PRELIMINARY  Bilateral lower extremity venous duplex completed.    Preliminary report:  Bilateral:  No evidence of DVT, superficial thrombosis, or Baker's Cyst.   Eunice Winecoff, RVS 10/13/2015, 5:02 PM

## 2015-10-13 NOTE — Progress Notes (Signed)
Patient is here for Edema and Back Bone  Patient complains of left sided rib cage pain being present. Currently scaled at a 6. Pain is described as aching and constant.  Patient has not taken any medication today nor has the patient eaten.

## 2015-10-13 NOTE — Progress Notes (Signed)
Shawn Brooks, is a 40 y.o. male  [id number]  [id number]  DOB - 1976-01-11  Chief Complaint  Patient presents with  . Foot Swelling    bilateral        Subjective:   Shawn Brooks is a 40 y.o. male here today for a follow up visit.  Last seen by NP on 08/25/15 for le swelling, which remains unchanged.  States he took the lasix 20mg  pills for about week, but did not notice any improvement.  Denies any recent travels/air plane rides.   He was recently in ED on 09/29/15 after slipping on stairs, and unfortunately had acute L3 transverse process fracture.  He was referred to [facility], but they apparently do not take [facility] card, and he cannot afford the copay $200.  He was discharged w/ back brace by ED, but stopped using it b/c had to go back to work.  He denies any heavy lifting at work, back pain is better, but still persistently in his lower left side /back region.   Patient has No headache, No chest pain, No abdominal pain - No Nausea, No new weakness tingling or numbness, No Cough - SOB.  No stool or urinary incontinence.  Denies orthopnia/pnd.  No problems updated.  ALLERGIES: No Known Allergies  PAST MEDICAL HISTORY: Past Medical History  Diagnosis Date  . HIV infection ([place])   . Blood dyscrasia     HIV  . Chronic back pain   . Depression   . Anxiety   . Headache(784.0)   . DJD (degenerative joint disease)     MEDICATIONS AT HOME: Prior to Admission medications   Medication Sig Start Date End Date Taking? Authorizing Provider  acetaminophen (TYLENOL) 500 MG tablet Take 1 tablet (500 mg total) by mouth every 6 (six) hours as needed. 01/18/14   Tresa Garter, MD  elvitegravir-cobicistat-emtricitabine-tenofovir (GENVOYA) 150-150-200-10 MG TABS tablet TAKE 1 TABLET BY MOUTH EVERY DAY WITH PREZISTA 05/16/15   Thayer Headings, MD  escitalopram (LEXAPRO) 20 MG tablet Take 1 tablet (20 mg total) by mouth daily. 10/18/13   Thayer Headings, MD  furosemide (LASIX) 40 MG  tablet Take 1 tablet (40 mg total) by mouth daily. 10/13/15   Maren Reamer, MD  methocarbamol (ROBAXIN) 500 MG tablet Take 1 tablet (500 mg total) by mouth 2 (two) times daily. 09/29/15   Antonietta Breach, PA-C  oxyCODONE-acetaminophen (PERCOCET/ROXICET) 5-325 MG tablet Take 1-2 tablets by mouth every 6 (six) hours as needed for severe pain. 09/29/15   Antonietta Breach, PA-C  potassium chloride SA (K-DUR,KLOR-CON) 20 MEQ tablet Take 1 tablet (20 mEq total) by mouth daily. 10/13/15   Maren Reamer, MD  predniSONE (DELTASONE) 20 MG tablet Take 2 tablets (40 mg total) by mouth daily. 09/29/15   Antonietta Breach, PA-C  PREZISTA 800 MG tablet TAKE 1 TABLET BY MOUTH EVERY DAY 09/26/15   Thayer Headings, MD  traZODone (DESYREL) 100 MG tablet Take 100 mg by mouth at bedtime as needed for sleep.  07/24/14   Historical Provider, MD  valACYclovir (VALTREX) 500 MG tablet Take 1 tablet (500 mg total) by mouth 2 (two) times daily. Take by mouth. Take 500 mg by mouth 2 (two) times daily as needed. For flare-ups. 10/08/14   Thayer Headings, MD     Objective:   Filed Vitals:   10/13/15 1221  BP: 103/66  Pulse: 73  Temp: 98 F (36.7 C)  TempSrc: Oral  Resp: 18  Height: 5\' 8"  (1.727  m)  Weight: 235 lb 6.4 oz (106.777 kg)  SpO2: 97%    Exam General appearance : Awake, alert, not in any distress. Speech Clear. Not toxic looking. aaox 3. Pleasant. HEENT: Atraumatic and Normocephalic, pupils equally reactive to light. Neck: supple, no JVD. No cervical lymphadenopathy.  Chest: Good air entry bilaterally, no w/c/r CVS: S1 S2 regular, no murmurs/gallups or rubs. Abdomen: Bowel sounds active, Non tender and not distended with no gaurding, rigidity or rebound. Extremities: B/L Lower Ext shows 2+ edema bilaterally, mainly below ankles, no palpable cords/caft tenderness noted.  Both legs are warm to touch. Neurology: Awake alert, and oriented X 3, CN II-XII grossly intact, Non focal Skin:No Rash  Data Review Lab Results    Component Value Date   HGBA1C 5.7* 11/16/2013     Assessment & Plan   1. Fracture of L2 vertebra, closed, initial encounter - could  Not afford last referral to NSG due to copay, I could not find referral note in system, so replaced NSG referral now. - Ambulatory referral to Neurosurgery  2. Human immunodeficiency virus (HIV) disease ([place]) Currently on  Genvoya and Prezista, per ID Dr Linus Salmons.   3. Bilateral lower extremity edema Ddx/ medication induced? Genvoya can cause proteinuria, thus edema due to hypoalbunemia? Vs dvt (although less likely) vs CHF (no sob/pnd/orthopnea) Will chk/ - Basic Metabolic Panel - Urinalysis - Brain natriuretic peptide  - eval chf, if abnormal, will need ECHO. - Ultrasound doppler venous legs bilat - ro dvt - Microalbumin/Creatinine Ratio, Urine  4. Suspected onychomycosis - lfts nml 08/25/15, but on HAART, will hold off on antifungals. - podiatry c/s pending [facility] discount  5. Financial services -recd pt see financial counselor for help w/ application.   Patient have been counseled extensively about nutrition and exercise  2 wks fu.  The patient was given clear instructions to go to ER or return to medical center if symptoms don't improve, worsen or new problems develop. The patient verbalized understanding. The patient was told to call to get lab results if they haven't heard anything in the next week.    Maren Reamer, MD, [facility] [place], [place]   10/13/2015, 1:06 PM

## 2015-10-14 LAB — URINALYSIS

## 2015-10-14 LAB — MICROALBUMIN / CREATININE URINE RATIO

## 2015-10-14 LAB — BRAIN NATRIURETIC PEPTIDE: Brain Natriuretic Peptide: 6.8 pg/mL (ref <100)

## 2015-10-16 ENCOUNTER — Telehealth: Payer: Self-pay | Admitting: Internal Medicine

## 2015-10-16 DIAGNOSIS — R609 Edema, unspecified: Secondary | ICD-10-CM

## 2015-10-16 NOTE — Telephone Encounter (Signed)
Pt. Called to speak to the nurse because he was prescribed compression sock and would like to know how long to wear them. Please f/u

## 2015-10-22 NOTE — Telephone Encounter (Signed)
Message routed to PCP for advice

## 2015-10-23 NOTE — Telephone Encounter (Signed)
-  please tell him that he should wear it as long as able during day when up and about. I do not know what happen to the last urine studies on 4/17 to look at his renal function. For some reason they were canceled. When he has a chance, if he could come by and give a sample again. I put in future lab orders.  I still do not know why he has lower extremity edema. Please tell him to try to limit his salt intake to 2g daily. Thanks.

## 2015-10-24 ENCOUNTER — Telehealth: Payer: Self-pay | Admitting: *Deleted

## 2015-10-24 NOTE — Telephone Encounter (Signed)
Patient verified DOB Patient was advised to wear compressions daily while up and running errands. Patient advised to limit salt intake. Patient also aware of needing a urine sample. Patient advised to come into the office today or the beginning part of next week. No further questions at this time.

## 2015-10-27 ENCOUNTER — Other Ambulatory Visit: Payer: Self-pay | Admitting: Internal Medicine

## 2015-10-29 NOTE — Telephone Encounter (Signed)
Patient notified that [facility] cannot fill his neurosurgery request - this specialty is not available.  He received a letter from [facility] trying to arrange lower-cost neurosurgery evaluation appointments for him. He is going to follow through with those referrals when he is able to afford it. RN agreed.

## 2015-11-04 ENCOUNTER — Ambulatory Visit: Payer: Self-pay | Attending: Internal Medicine

## 2015-11-04 ENCOUNTER — Telehealth: Payer: Self-pay | Admitting: *Deleted

## 2015-11-04 DIAGNOSIS — R609 Edema, unspecified: Secondary | ICD-10-CM

## 2015-11-04 NOTE — Telephone Encounter (Signed)
-----   Message from Maren Reamer, MD sent at 10/14/2015  5:22 PM EDT ----- Can you call him to see if we can get another urine sample to check his kidneys via urine test. Somehow test w/o appropriate specimen and was canceled? thanks

## 2015-11-04 NOTE — Telephone Encounter (Signed)
Patient verified DOB Patient is aware of bringing a urine sample to the office. Patient stated he would come to the office today to bring the urine sample. Patient is also aware of no DVT being present in Venous US. No further questions at this time.

## 2015-11-05 ENCOUNTER — Telehealth: Payer: Self-pay | Admitting: *Deleted

## 2015-11-05 LAB — MICROALBUMIN / CREATININE URINE RATIO: CREATININE, URINE: 62 mg/dL (ref 20–370)

## 2015-11-05 LAB — URINALYSIS
Bilirubin Urine: NEGATIVE
Glucose, UA: NEGATIVE
Hgb urine dipstick: NEGATIVE
KETONES UR: NEGATIVE
LEUKOCYTES UA: NEGATIVE
NITRITE: NEGATIVE
PROTEIN: NEGATIVE
Specific Gravity, Urine: 1.007 (ref 1.001–1.035)
pH: 7 (ref 5.0–8.0)

## 2015-11-05 NOTE — Telephone Encounter (Signed)
-----   Message from Maren Reamer, MD sent at 11/05/2015 12:53 PM EDT ----- Please tell pt his urine labs look completely normal, no signs of protein dumping in urine, no signs it is kidneys causing his le swelling.  Ask him if legs still swollen? Is he wearing the ted hose.  Recd low salt <2g to prevent worsening swelling for now. thanks

## 2015-11-05 NOTE — Telephone Encounter (Signed)
Patient verified DOB Patient is aware of urine being normal and no protein being seen. Patient advised to use TED hose as much as tolerated and to limit salt intake. Patient denies as much swelling at this time. Patient advised to use TED hose is swelling returns. No further questions at this time.

## 2015-11-05 NOTE — Telephone Encounter (Signed)
Medical Assistant left message on patient's home and cell voicemail. Voicemail states to give a call back to Nubia with [facility] at [phone].  

## 2015-11-12 ENCOUNTER — Emergency Department (HOSPITAL_COMMUNITY)
Admission: EM | Admit: 2015-11-12 | Discharge: 2015-11-12 | Disposition: A | Discharge status: Home / Self Care | Payer: Self-pay | Attending: Emergency Medicine | Admitting: Emergency Medicine

## 2015-11-12 ENCOUNTER — Emergency Department (HOSPITAL_COMMUNITY): Payer: Self-pay

## 2015-11-12 DIAGNOSIS — M1990 Unspecified osteoarthritis, unspecified site: Secondary | ICD-10-CM | POA: Insufficient documentation

## 2015-11-12 DIAGNOSIS — F419 Anxiety disorder, unspecified: Secondary | ICD-10-CM | POA: Insufficient documentation

## 2015-11-12 DIAGNOSIS — F410 Panic disorder [episodic paroxysmal anxiety] without agoraphobia: Secondary | ICD-10-CM

## 2015-11-12 DIAGNOSIS — F329 Major depressive disorder, single episode, unspecified: Secondary | ICD-10-CM | POA: Insufficient documentation

## 2015-11-12 DIAGNOSIS — F17210 Nicotine dependence, cigarettes, uncomplicated: Secondary | ICD-10-CM | POA: Insufficient documentation

## 2015-11-12 LAB — I-STAT TROPONIN, ED: TROPONIN I, POC: 0.05 ng/mL (ref 0.00–0.08)

## 2015-11-12 MED ORDER — HYDROXYZINE HCL 25 MG PO TABS: 25.0000 mg | ORAL_TABLET | Freq: Three times a day (TID) | ORAL | Status: DC | PRN

## 2015-11-12 MED ORDER — LORAZEPAM 0.5 MG PO TABS: 0.5000 mg | ORAL_TABLET | Freq: Three times a day (TID) | ORAL | Status: DC | PRN

## 2015-11-12 MED ORDER — LORAZEPAM 1 MG PO TABS
1.0000 mg | ORAL_TABLET | Freq: Once | ORAL | Status: AC
  Administered 2015-11-12: 1 mg via ORAL
  Filled 2015-11-12: qty 1

## 2015-11-12 NOTE — Discharge Instructions (Signed)

## 2015-11-12 NOTE — ED Provider Notes (Signed)
CSN: [id number]     Arrival date & time 11/12/15  1658 History   First MD Initiated Contact with Patient 11/12/15 1935     Chief Complaint  Patient presents with  . Panic Attack     (Consider location/radiation/quality/duration/timing/severity/associated sxs/prior Treatment) HPI   Shawn Brooks is a 40 year old male with pretty medical history of anxiety, depression and bipolar, also HIV positive with ART tx and CD4 count of 840 (10/02/15) presents emergency Department with complaint of panic attack. He states that he was recently taken off his leg to do because it was interacting with his HIV medication and over the past 3 weeks he has had anxiety attacks every Wednesday which correlates with returning to work. He states this morning he woke up and was feeling well, showered and was gait ready for work when he had sudden onset of chest tightness, shortness of breath, tingling and pounding heart. His symptoms are consistent with his past anxiety attacks. He states he did not have any nausea, vomiting, near syncope, chest pain.   He denies any lower extremity edema, palpitations, cough, fever, chills, sweats. He returns to psychiatry in 6 days.  Past Medical History  Diagnosis Date  . HIV infection ([place])   . Blood dyscrasia     HIV  . Chronic back pain   . Depression   . Anxiety   . Headache(784.0)   . DJD (degenerative joint disease)    Past Surgical History  Procedure Laterality Date  . Wisdom tooth extraction    . Esophagogastroduodenoscopy  03/27/2012    Procedure: ESOPHAGOGASTRODUODENOSCOPY (EGD);  Surgeon: Lear Ng, MD;  Location: [facility];  Service: Endoscopy;  Laterality: N/A;  . Irrigation and debridement abscess N/A 03/13/2015    Procedure: IRRIGATION AND DEBRIDEMENT ABSCESS;  Surgeon: Johnathan Hausen, MD;  Location: [facility];  Service: General;  Laterality: N/A;   Family History  Problem Relation Age of Onset  . Cancer Mother     laryngeal  . Pneumonia Father    . Diabetes Father   . Hypertension Sister   . Crohn's disease Brother   . Crohn's disease Maternal Grandmother   . Cancer Maternal Grandmother     patient unsure of type  . Colon cancer Maternal Grandfather    Social History  Substance Use Topics  . Smoking status: Current Every Day Smoker -- 0.50 packs/day    Types: Cigarettes  . Smokeless tobacco: Never Used  . Alcohol Use: 1.2 oz/week    2 Standard drinks or equivalent per week     Comment: beer at times. not often    Review of Systems  All other systems reviewed and are negative.     Allergies  Review of patient's allergies indicates no known allergies.  Home Medications   Prior to Admission medications   Medication Sig Start Date End Date Taking? Authorizing Provider  GENVOYA 150-150-200-10 MG TABS tablet TAKE 1 TABLET BY MOUTH EVERY DAY WITH PREZISTA 10/28/15  Yes Thayer Headings, MD  omeprazole (PRILOSEC) 40 MG capsule Take 40 mg by mouth daily.   Yes Historical Provider, MD  PREZISTA 800 MG tablet TAKE 1 TABLET BY MOUTH EVERY DAY 10/28/15  Yes Thayer Headings, MD  traZODone (DESYREL) 100 MG tablet Take 300 mg by mouth at bedtime.  07/24/14  Yes Historical Provider, MD  acetaminophen (TYLENOL) 500 MG tablet Take 1 tablet (500 mg total) by mouth every 6 (six) hours as needed. Patient not taking: Reported on 11/12/2015 01/18/14  Tresa Garter, MD  escitalopram (LEXAPRO) 20 MG tablet Take 1 tablet (20 mg total) by mouth daily. Patient not taking: Reported on 11/12/2015 10/18/13   Thayer Headings, MD  furosemide (LASIX) 40 MG tablet Take 1 tablet (40 mg total) by mouth daily. Patient not taking: Reported on 11/12/2015 10/13/15   Maren Reamer, MD  hydrOXYzine (ATARAX/VISTARIL) 25 MG tablet Take 1 tablet (25 mg total) by mouth every 8 (eight) hours as needed for anxiety. 11/12/15   Delsa Grana, PA-C  LORazepam (ATIVAN) 0.5 MG tablet Take 1 tablet (0.5 mg total) by mouth every 8 (eight) hours as needed for anxiety (for  anxiety/panic attack). 11/12/15   Delsa Grana, PA-C  methocarbamol (ROBAXIN) 500 MG tablet Take 1 tablet (500 mg total) by mouth 2 (two) times daily. Patient not taking: Reported on 11/12/2015 09/29/15   Antonietta Breach, PA-C  oxyCODONE-acetaminophen (PERCOCET/ROXICET) 5-325 MG tablet Take 1-2 tablets by mouth every 6 (six) hours as needed for severe pain. Patient not taking: Reported on 11/12/2015 09/29/15   Antonietta Breach, PA-C  potassium chloride SA (K-DUR,KLOR-CON) 20 MEQ tablet Take 1 tablet (20 mEq total) by mouth daily. Patient not taking: Reported on 11/12/2015 10/13/15   Maren Reamer, MD  predniSONE (DELTASONE) 20 MG tablet Take 2 tablets (40 mg total) by mouth daily. Patient not taking: Reported on 11/12/2015 09/29/15   Antonietta Breach, PA-C  valACYclovir (VALTREX) 500 MG tablet Take 1 tablet (500 mg total) by mouth 2 (two) times daily. Take by mouth. Take 500 mg by mouth 2 (two) times daily as needed. For flare-ups. Patient not taking: Reported on 11/12/2015 10/08/14   Thayer Headings, MD   BP 111/81 mmHg  Pulse 74  Temp(Src) 98.3 F (36.8 C) (Oral)  Resp 18  SpO2 100% Physical Exam  Constitutional: He is oriented to person, place, and time. He appears well-developed and well-nourished. No distress.  HENT:  Head: Normocephalic and atraumatic.  Nose: Nose normal.  Mouth/Throat: Oropharynx is clear and moist. No oropharyngeal exudate.  Eyes: Conjunctivae and EOM are normal. Pupils are equal, round, and reactive to light. Right eye exhibits no discharge. Left eye exhibits no discharge. No scleral icterus.  Neck: Normal range of motion. No JVD present. No tracheal deviation present. No thyromegaly present.  Cardiovascular: Normal rate, regular rhythm, normal heart sounds and intact distal pulses.  Exam reveals no gallop and no friction rub.   No murmur heard. Pulmonary/Chest: Effort normal and breath sounds normal. No respiratory distress. He has no wheezes. He has no rales. He exhibits no tenderness.   Abdominal: Soft. Bowel sounds are normal. He exhibits no distension and no mass. There is no tenderness. There is no rebound and no guarding.  Musculoskeletal: Normal range of motion. He exhibits no edema or tenderness.  Lymphadenopathy:    He has no cervical adenopathy.  Neurological: He is alert and oriented to person, place, and time. He has normal reflexes. No cranial nerve deficit. He exhibits normal muscle tone. Coordination normal.  Skin: Skin is warm and dry. No rash noted. He is not diaphoretic. No erythema. No pallor.  Psychiatric: He has a normal mood and affect. His behavior is normal. Judgment and thought content normal.  Nursing note and vitals reviewed.   ED Course  Procedures (including critical care time) [place], ED    Imaging Review No results found. I have personally reviewed and evaluated these images and lab results as part of my medical decision-making.  EKG Interpretation   Date/Time:  Wednesday Nov 12 2015 19:53:18 EDT Ventricular Rate:  60 PR Interval:  151 QRS Duration: 91 QT Interval:  385 QTC Calculation: 385 R Axis:   112 Text Interpretation:  Sinus rhythm Right axis deviation ST elev, probable  normal early repol pattern Confirmed by COOK  MD, BRIAN ([id number]) on  11/14/2015 9:17:32 AM      MDM   40 year old male presents with panic attack, reports palpitations and arm tingling, no other history concerning for pulmonary or cardiac source. Symptoms are consistent with past panic attacks which is at for several years. His lip to do medication was discontinued 2 months ago and he had worsening symptoms over the past 3 months. He was given 1 mg of Ativan while in the ER. Chest x-ray, EKG and troponin were obtained.  Patient is well appearing on exam, lungs clear to auscultation, heart rate regular rate and rhythm without murmur gallop or rales. He does not appear to have any lower extremity asymmetry, clinically does  not concerning for DVT, PERC negative, do not suspect PE.  He does not have any history of hypertension, diabetes, dyslipidemia, no family history of heart attack stroke, low risk for ACS and patient's presentation is not consistent nor concerning for ACS.  We will get a prescription for Vistaril, and a few Ativan for acute episodes.  He will follow up with psychiatry next week for further management.  Is well appearing, does not currently appear anxious, heart rate 74. He denies SI, HI, AVH  Final diagnoses:  Anxiety attack       Delsa Grana, PA-C 11/20/15 0037  Daleen Bo, MD 11/20/15 [phone]

## 2015-11-12 NOTE — ED Notes (Signed)
Pt reports anxiety attacks, reports palpitation and arm tingling. sts has HX anxiety yet stopped taking meds due to medication interaction per pt. Sts has appointment with [facility] Dr. on 5/23 for new prescription .

## 2015-11-18 ENCOUNTER — Encounter: Payer: Self-pay | Admitting: Internal Medicine

## 2015-11-18 ENCOUNTER — Ambulatory Visit: Payer: Self-pay | Attending: Internal Medicine | Admitting: Internal Medicine

## 2015-11-18 VITALS — BP 117/75 | HR 73 | Temp 97.7°F | Wt 237.6 lb

## 2015-11-18 DIAGNOSIS — F410 Panic disorder [episodic paroxysmal anxiety] without agoraphobia: Secondary | ICD-10-CM

## 2015-11-18 DIAGNOSIS — F341 Dysthymic disorder: Secondary | ICD-10-CM

## 2015-11-18 DIAGNOSIS — L84 Corns and callosities: Secondary | ICD-10-CM

## 2015-11-18 DIAGNOSIS — M545 Low back pain: Secondary | ICD-10-CM

## 2015-11-18 DIAGNOSIS — Z716 Tobacco abuse counseling: Secondary | ICD-10-CM

## 2015-11-18 DIAGNOSIS — B20 Human immunodeficiency virus [HIV] disease: Secondary | ICD-10-CM

## 2015-11-18 DIAGNOSIS — G8929 Other chronic pain: Secondary | ICD-10-CM

## 2015-11-18 NOTE — Progress Notes (Signed)
Shawn Brooks, is a 40 y.o. male  [id number]  [id number]  DOB - [birth year]  Chief Complaint  Patient presents with  . Follow-up    ED - anxiety attack        Subjective:   Shawn Brooks is a 40 y.o. male here today for a follow up visit and ER f/u. Pt was seen in ED 5/17 for panic attack.  He states he is doing better now, anxiety levels not too bad, and he is sparingly using his vistoril and ativan prn until he see [facility] therapy.  Has appt 6/6 w/ [facility].  Per pt, use to be on Lexapro in past, but they stopped him and switched him to [place].  He currently is not on anything except the PRNs Vistoril and ativan dispensed from ED 5/17. No SI/HI/AVH.  He states his bilateral LE edema has resolved since I last saw him and has no further c/os. He has not seen NSG b/c does not have financial assistance /insurance yet.  Per pt, some of his family will not provide the data needed.  He is still smoking 1/2 ppd and not ready to quit.  Patient has No headache, No chest pain, No abdominal pain - No Nausea, No new weakness tingling or numbness, No Cough - SOB.  No problems updated.  ALLERGIES: No Known Allergies  PAST MEDICAL HISTORY: Past Medical History  Diagnosis Date  . HIV infection ([place])   . Blood dyscrasia     HIV  . Chronic back pain   . Depression   . Anxiety   . Headache(784.0)   . DJD (degenerative joint disease)     MEDICATIONS AT HOME: Prior to Admission medications   Medication Sig Start Date End Date Taking? Authorizing Provider  GENVOYA 150-150-200-10 MG TABS tablet TAKE 1 TABLET BY MOUTH EVERY DAY WITH PREZISTA 10/28/15  Yes Thayer Headings, MD  hydrOXYzine (ATARAX/VISTARIL) 25 MG tablet Take 1 tablet (25 mg total) by mouth every 8 (eight) hours as needed for anxiety. 11/12/15  Yes Delsa Grana, PA-C  LORazepam (ATIVAN) 0.5 MG tablet Take 1 tablet (0.5 mg total) by mouth every 8 (eight) hours as needed for anxiety (for anxiety/panic attack). 11/12/15  Yes  Delsa Grana, PA-C  omeprazole (PRILOSEC) 40 MG capsule Take 40 mg by mouth daily.   Yes Historical Provider, MD  PREZISTA 800 MG tablet TAKE 1 TABLET BY MOUTH EVERY DAY 10/28/15  Yes Thayer Headings, MD  traZODone (DESYREL) 100 MG tablet Take 300 mg by mouth at bedtime.  07/24/14  Yes Historical Provider, MD  valACYclovir (VALTREX) 500 MG tablet Take 1 tablet (500 mg total) by mouth 2 (two) times daily. Take by mouth. Take 500 mg by mouth 2 (two) times daily as needed. For flare-ups. 10/08/14  Yes Thayer Headings, MD  escitalopram (LEXAPRO) 20 MG tablet Take 1 tablet (20 mg total) by mouth daily. Patient not taking: Reported on 11/12/2015 10/18/13   Thayer Headings, MD  furosemide (LASIX) 40 MG tablet Take 1 tablet (40 mg total) by mouth daily. Patient not taking: Reported on 11/12/2015 10/13/15   Maren Reamer, MD     Objective:   Filed Vitals:   11/18/15 1038  BP: 117/75  Pulse: 73  Temp: 97.7 F (36.5 C)  TempSrc: Oral  Weight: 237 lb 9.6 oz (107.775 kg)    Exam General appearance : Awake, alert, not in any distress. Speech Clear. Not toxic looking,ambulating w/o difficulties. HEENT: Atraumatic and Normocephalic, pupils equally  reactive to light. Neck: supple, no JVD. No cervical lymphadenopathy.  Chest:Good air entry bilaterally, no added sounds. CVS: S1 S2 regular, no murmurs/gallups or rubs. Abdomen: Bowel sounds active, Non tender  Extremities: B/L Lower Ext shows no edema, both legs are warm to touch. 2+ peripheral pulses.  Small callus noted btw 2nd and 3rd toes from friction.  Neurology: Awake alert, and oriented X 3, CN II-XII grossly intact, Non focal Skin:No Rash  Data Review Lab Results  Component Value Date   HGBA1C 5.7* 11/16/2013    Depression screen [facility] 2/9 11/18/2015 10/13/2015 10/02/2015 11/12/2014 04/04/2014  Decreased Interest 1 0 0 0 0  Down, Depressed, Hopeless 2 0 0 0 0  PHQ - 2 Score 3 0 0 0 0  Altered sleeping 1 - - - -  Tired, decreased energy 1 - - - -    Change in appetite 1 - - - -  Feeling bad or failure about yourself  1 - - - -  Trouble concentrating 0 - - - -  Moving slowly or fidgety/restless 0 - - - -  Suicidal thoughts 0 - - - -  PHQ-9 Score 7 - - - -  Difficult doing work/chores Somewhat difficult - - - -      Assessment & Plan   1. Panic attack Resolved, prn vistoril and ativan (dispensed from ED 11/12/15) until sees [facility] 12/02/15.  2. ANXIETY DEPRESSION See above, has been on Lexapro, and than Latuda in past, both stopped due to drug interactions w/ HIV meds in past. Will defer to [facility].  3. Chronic low back pain from lumbar transverse process frx in past, nsg pending financial services, recd f/u w/ [facility]  4. Corn or callus Likely from friction, recd placing bandage or even ducttape in region to reduce friction, better fitting shoes.  5. hiv Defer to ID, currently on HAART (Genvoya and Prezista)  6. tob abuse - not ready to stop due to anxiety, encouraged to f/u w/ [facility], and will talk more afterwards. Tob cessation counseling given, but pt is not ready.  Patient have been counseled extensively about nutrition and exercise  Return in about 3 months (around 02/18/2016).  The patient was given clear instructions to go to ER or return to medical center if symptoms don't improve, worsen or new problems develop. The patient verbalized understanding. The patient was told to call to get lab results if they haven't heard anything in the next week.    Maren Reamer, MD, [place] and [facility], [place]   11/18/2015, 11:01 AM

## 2015-11-18 NOTE — Patient Instructions (Signed)
Panic Attacks Panic attacks are sudden, short feelings of great fear or discomfort. You may have them for no reason when you are relaxed, when you are uneasy (anxious), or when you are sleeping.  HOME CARE  Take all your medicines as told.  Check with your doctor before starting new medicines.  Keep all doctor visits. GET HELP IF:  You are not able to take your medicines as told.  Your symptoms do not get better.  Your symptoms get worse. GET HELP RIGHT AWAY IF:  Your attacks seem different than your normal attacks.  You have thoughts about hurting yourself or others.  You take panic attack medicine and you have a side effect. MAKE SURE YOU:  Understand these instructions.  Will watch your condition.  Will get help right away if you are not doing well or get worse.   This information is not intended to replace advice given to you by your health care provider. Make sure you discuss any questions you have with your health care provider.   Document Released: 07/17/2010 Document Revised: 04/04/2013 Document Reviewed: 01/26/2013 Elsevier Interactive Patient Education 2016 [place] Anxiety Disorder Generalized anxiety disorder (GAD) is a mental disorder. It interferes with life functions, including relationships, work, and school. GAD is different from normal anxiety, which everyone experiences at some point in their lives in response to specific life events and activities. Normal anxiety actually helps [place] prepare for and get through these life events and activities. Normal anxiety goes away after the event or activity is over.  GAD causes anxiety that is not necessarily related to specific events or activities. It also causes excess anxiety in proportion to specific events or activities. The anxiety associated with GAD is also difficult to control. GAD can vary from mild to severe. People with severe GAD can have intense waves of anxiety with physical symptoms  (panic attacks).  SYMPTOMS The anxiety and worry associated with GAD are difficult to control. This anxiety and worry are related to many life events and activities and also occur more days than not for 6 months or longer. People with GAD also have three or more of the following symptoms (one or more in children):  Restlessness.   Fatigue.  Difficulty concentrating.   Irritability.  Muscle tension.  Difficulty sleeping or unsatisfying sleep. DIAGNOSIS GAD is diagnosed through an assessment by your health care provider. Your health care provider will ask you questions aboutyour mood,physical symptoms, and events in your life. Your health care provider may ask you about your medical history and use of alcohol or drugs, including prescription medicines. Your health care provider may also do a physical exam and blood tests. Certain medical conditions and the use of certain substances can cause symptoms similar to those associated with GAD. Your health care provider may refer you to a mental health specialist for further evaluation. TREATMENT The following therapies are usually used to treat GAD:   Medication. Antidepressant medication usually is prescribed for long-term daily control. Antianxiety medicines may be added in severe cases, especially when panic attacks occur.   Talk therapy (psychotherapy). Certain types of talk therapy can be helpful in treating GAD by providing support, education, and guidance. A form of talk therapy called cognitive behavioral therapy can teach you healthy ways to think about and react to daily life events and activities.  Stress managementtechniques. These include yoga, meditation, and exercise and can be very helpful when they are practiced regularly. A mental health specialist can  help determine which treatment is best for you. Some people see improvement with one therapy. However, other people require a combination of therapies.   This information is  not intended to replace advice given to you by your health care provider. Make sure you discuss any questions you have with your health care provider.   Document Released: 10/09/2012 Document Revised: 07/05/2014 Document Reviewed: 10/09/2012 Elsevier Interactive Patient Education [employer].

## 2015-12-01 ENCOUNTER — Other Ambulatory Visit: Payer: Self-pay | Admitting: Internal Medicine

## 2015-12-01 DIAGNOSIS — K219 Gastro-esophageal reflux disease without esophagitis: Secondary | ICD-10-CM

## 2016-01-02 ENCOUNTER — Other Ambulatory Visit: Payer: Self-pay | Admitting: Internal Medicine

## 2016-01-19 ENCOUNTER — Telehealth: Payer: Self-pay | Admitting: *Deleted

## 2016-01-19 NOTE — Telephone Encounter (Signed)
Patient called stating Dr. Linus Salmons had prescribed him Cialis previously and he is requesting a new Rx for this.

## 2016-01-21 ENCOUNTER — Other Ambulatory Visit: Payer: Self-pay | Admitting: Internal Medicine

## 2016-01-21 ENCOUNTER — Other Ambulatory Visit: Payer: Self-pay | Admitting: *Deleted

## 2016-01-21 MED ORDER — TADALAFIL 10 MG PO TABS: 10.0000 mg | ORAL_TABLET | Freq: Every day | ORAL | 5 refills | Status: DC | PRN

## 2016-01-21 MED ORDER — TADALAFIL 20 MG PO TABS: 20.0000 mg | ORAL_TABLET | ORAL | 1 refills | Status: DC | PRN

## 2016-01-21 NOTE — Telephone Encounter (Signed)
Called to notify patient and he is requesting 20 mg instead of 10, please advise.

## 2016-01-21 NOTE — Telephone Encounter (Signed)
Rx sent and patient notified. Jacqueline Cockerham  

## 2016-01-21 NOTE — Telephone Encounter (Signed)
With his ARVs including cobiscistat, it boosts the dose so he should not take more than 10 mg.  Though I will check with Lonna Duval if he thinks 20 mg would be ok and then you can change it.  Thanks  Minh, Is 20mg  cialis ok with a booster?

## 2016-01-21 NOTE — Telephone Encounter (Signed)
That's fine.  I put it in and accidentally sent it to his specialty pharmacy, though he probably wants it local, since it's not covered anyway.  thanks

## 2016-01-21 NOTE — Telephone Encounter (Signed)
Minh, is out and I talked to [facility] she said 20 mg every other day (no more than 3 times a week). Or if daily should not be over 5 mg.

## 2016-01-21 NOTE — Telephone Encounter (Signed)
Ok, 20 mg is ok then. thanks

## 2016-01-27 ENCOUNTER — Ambulatory Visit: Payer: Self-pay

## 2016-02-16 ENCOUNTER — Encounter: Payer: Self-pay | Admitting: Internal Medicine

## 2016-02-16 ENCOUNTER — Ambulatory Visit: Payer: Self-pay | Attending: Internal Medicine | Admitting: Internal Medicine

## 2016-02-16 VITALS — BP 108/71 | HR 84 | Temp 98.7°F | Resp 16 | Wt 236.0 lb

## 2016-02-16 DIAGNOSIS — R198 Other specified symptoms and signs involving the digestive system and abdomen: Secondary | ICD-10-CM

## 2016-02-16 DIAGNOSIS — S39012A Strain of muscle, fascia and tendon of lower back, initial encounter: Secondary | ICD-10-CM | POA: Insufficient documentation

## 2016-02-16 DIAGNOSIS — X500XXA Overexertion from strenuous movement or load, initial encounter: Secondary | ICD-10-CM | POA: Insufficient documentation

## 2016-02-16 DIAGNOSIS — F419 Anxiety disorder, unspecified: Secondary | ICD-10-CM | POA: Insufficient documentation

## 2016-02-16 DIAGNOSIS — B20 Human immunodeficiency virus [HIV] disease: Secondary | ICD-10-CM | POA: Insufficient documentation

## 2016-02-16 DIAGNOSIS — T43225A Adverse effect of selective serotonin reuptake inhibitors, initial encounter: Secondary | ICD-10-CM

## 2016-02-16 DIAGNOSIS — R197 Diarrhea, unspecified: Secondary | ICD-10-CM | POA: Insufficient documentation

## 2016-02-16 DIAGNOSIS — F329 Major depressive disorder, single episode, unspecified: Secondary | ICD-10-CM | POA: Insufficient documentation

## 2016-02-16 MED ORDER — CYCLOBENZAPRINE HCL 10 MG PO TABS: 10.0000 mg | ORAL_TABLET | Freq: Three times a day (TID) | ORAL | 0 refills | Status: DC | PRN

## 2016-02-16 NOTE — Progress Notes (Signed)
Pt is in the office today for diarrhea Pt states his pain level today in the office is a 3 Pt states his pain is coming from his back Pt states he doesn't know all the medication he is taking but he will bring an updated list of his medication tomorrow

## 2016-02-16 NOTE — Progress Notes (Signed)
Shawn Brooks, is a 40 y.o. male  [id number]  [id number]  DOB - [birth year]  Chief Complaint  Patient presents with  . Back Pain  . Diarrhea        Subjective:   Shawn Brooks is a 40 y.o. male here today for a follow up visit, w/ hx of anxiety, depression and HIV.  Per pt, has been on same HAART meds for long time, but new rx include gabapentin and SSRI sertraline 100mg  qday, started about 1 month ago at [facility]. Per pt, for last 3-4 wks, having c/o of intermittent constipation w/ loose stools, and sometimes severe watery diarrhea up to 3-4 x day.  1 episode of transient blood in stool, resolved.  Staying hydrated, no n/v.   Denies fevers, tremors , seizures.  Was doing some heavy lifting the other day, and feels like pulled muscle. Hx of transverse process frx L2 on 4/17 from fall. No urinary/stool incontinence.   Patient has No headache, No chest pain, No abdominal pain - No Nausea, No new weakness tingling or numbness, No Cough - SOB.  No problems updated.  ALLERGIES: No Known Allergies  PAST MEDICAL HISTORY: Past Medical History:  Diagnosis Date  . Anxiety   . Blood dyscrasia    HIV  . Chronic back pain   . Depression   . DJD (degenerative joint disease)   . Headache(784.0)   . HIV infection ([place])     MEDICATIONS AT HOME: Prior to Admission medications   Medication Sig Start Date End Date Taking? Authorizing Provider  GENVOYA 150-150-200-10 MG TABS tablet TAKE 1 TABLET BY MOUTH EVERY DAY WITH PREZISTA 10/28/15  Yes Thayer Headings, MD  omeprazole (PRILOSEC) 20 MG capsule TAKE 1 CAPSULE BY MOUTH DAILY 12/01/15  Yes Thayer Headings, MD  valACYclovir (VALTREX) 500 MG tablet TAKE 1 TABLET BY MOUTH TWICE DAILY AS NEEDED FOR FLARE UPS 01/02/16  Yes Thayer Headings, MD  cyclobenzaprine (FLEXERIL) 10 MG tablet Take 1 tablet (10 mg total) by mouth 3 (three) times daily as needed for muscle spasms. 02/16/16   Maren Reamer, MD  escitalopram (LEXAPRO) 20 MG tablet Take  1 tablet (20 mg total) by mouth daily. Patient not taking: Reported on 11/12/2015 10/18/13   Thayer Headings, MD  furosemide (LASIX) 40 MG tablet Take 1 tablet (40 mg total) by mouth daily. Patient not taking: Reported on 11/12/2015 10/13/15   Maren Reamer, MD  hydrOXYzine (ATARAX/VISTARIL) 25 MG tablet Take 1 tablet (25 mg total) by mouth every 8 (eight) hours as needed for anxiety. 11/12/15   Delsa Grana, PA-C  LORazepam (ATIVAN) 0.5 MG tablet Take 1 tablet (0.5 mg total) by mouth every 8 (eight) hours as needed for anxiety (for anxiety/panic attack). 11/12/15   Delsa Grana, PA-C  PREZISTA 800 MG tablet TAKE 1 TABLET BY MOUTH EVERY DAY 10/28/15   Thayer Headings, MD  traZODone (DESYREL) 100 MG tablet Take 300 mg by mouth at bedtime.  07/24/14   Historical Provider, MD     Objective:   Vitals:   02/16/16 1631  BP: 108/71  Pulse: 84  Resp: 16  Temp: 98.7 F (37.1 C)  TempSrc: Oral  SpO2: 96%  Weight: 258 lb 12.8 oz (117.4 kg)    Exam General appearance : Awake, alert, not in any distress. Speech Clear. Not toxic looking, pleasant. HEENT: Atraumatic and Normocephalic, pupils equally reactive to light.  Neck: supple, no JVD.  Chest:Good air entry bilaterally, no added sounds. CVS:  S1 S2 regular, no murmurs/gallups or rubs. Abdomen: Bowel sounds active, Non tender and not distended with no gaurding, rigidity or rebound. Extremities: B/L Lower Ext shows no edema, both legs are warm to touch Neurology: Awake alert, and oriented X 3, CN II-XII grossly intact, Non focal Skin:No Rash  Data Review Lab Results  Component Value Date   HGBA1C 5.7 (H) 11/16/2013    Depression screen PHQ 2/9 02/16/2016 11/18/2015 10/13/2015 10/02/2015 11/12/2014  Decreased Interest 1 1 0 0 0  Down, Depressed, Hopeless 1 2 0 0 0  PHQ - 2 Score 2 3 0 0 0  Altered sleeping 1 1 - - -  Tired, decreased energy 1 1 - - -  Change in appetite 1 1 - - -  Feeling bad or failure about yourself  1 1 - - -  Trouble  concentrating 1 0 - - -  Moving slowly or fidgety/restless 0 0 - - -  Suicidal thoughts 0 0 - - -  PHQ-9 Score 7 7 - - -  Difficult doing work/chores - Somewhat difficult - - -      Assessment & Plan   1. 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 Currently on 100mg  qday, recd decreasing dose to 50 qday to see if helps. Add probiotics to regimen for 1-2 wks If no improvement, gi f/u., possible ibs?  2.  Hx of AIN/condyloma - never f/u w/ [facility]/Dr Gross per gi recd 2015.  3. Back strain From heavy lifting, prior hx of Hx of transverse process frx L2 on 4/17 from fall. Flexeril prn.  Patient have been counseled extensively about nutrition and exercise  Return in about 3 months (around 05/18/2016), or if symptoms worsen or fail to improve.  The patient was given clear instructions to go to ER or return to medical center if symptoms don't improve, worsen or new problems develop. The patient verbalized understanding. The patient was told to call to get lab results if they haven't heard anything in the next week.   This note has been created with Surveyor, quantity. Any transcriptional errors are unintentional.   Maren Reamer, MD, [place] and [facility] [place], [place]   02/16/2016, 4:46 PM

## 2016-02-16 NOTE — Patient Instructions (Addendum)
Mild case suspected from Sertraline// Serotonin Syndrome Serotonin is a brain chemical that regulates the nervous system, which includes the brain, spinal cord, and nerves. Serotonin appears to play a role in all types of behavior, including appetite, emotions, movement, thinking, and response to stress. Excessively high levels of serotonin in the body can cause serotonin syndrome, which is a very dangerous condition. CAUSES This condition can be caused by taking medicines or drugs that increase the level of serotonin in your body. These include:  Antidepressant medicines.  Migraine medicines.  Certain pain medicines.  Certain recreational drugs, including ecstasy, LSD, cocaine, and amphetamines.  Over-the-counter cough or cold medicines that contain dextromethorphan.  Certain herbal supplements, including St. John's wort, ginseng, and nutmeg. This condition usually occurs when you take these medicines or drugs in combination, but it can also happen with a high dose of a single medicine or drug. RISK FACTORS This condition is more likely to develop in:  People who have recently increased the dosage of medicine that increases the serotonin level.  People who just started taking medicine that increases the serotonin level. SYMPTOMS Symptoms of this condition usually happens within several hours of a medicine change. Symptoms include:  Headache.  Muscle twitching or stiffness.  Diarrhea.  Confusion.  Restlessness or agitation.  Shivering or goose bumps.  Loss of muscle coordination.  Rapid heart rate.  Sweating. Severe cases of serotonin syndromecan cause:  Irregular heartbeat.  Seizures.  Loss of consciousness.  High fever. DIAGNOSIS This condition is diagnosed with a medical history and physical exam. You will be asked aboutyour symptoms and your use of medicines and recreational drugs. Your health care provider may also order lab work or additional tests to rule  out other causes of your symptoms. TREATMENT The treatment for this condition depends on the severity of your symptoms. For mild cases, stopping the medicine that caused your condition is usually all that is needed. For moderate to severe cases, hospitalization is required to monitor you and to prevent further muscle damage. HOME CARE INSTRUCTIONS  Take over-the-counter and prescription medicines only as told by your health care provider. This is important.  Check with your health care provider before you start taking any new prescriptions, over-the-counter medicines, herbs, or supplements.  Avoid combining any medicines that can cause this condition to occur.  Keep all follow-up visits as told by your health care provider.This is important.  Maintain a healthy lifestyle.  Eat healthy foods.  Get plenty of sleep.  Exercise regularly.  Do not drink alcohol.  Do not use recreational drugs. SEEK MEDICAL CARE IF:  Medicines do not seem to be helping.  Your symptoms do not improve or they get worse.  You have trouble taking care of yourself. SEEK IMMEDIATE MEDICAL CARE IF:  You have worsening confusion, severe headache, chest pain, high fever, seizures, or loss of consciousness.  You have serious thoughts about hurting yourself or others.  You experience serious side effects of medicine, such as swelling of your face, lips, tongue, or throat.   This information is not intended to replace advice given to you by your health care provider. Make sure you discuss any questions you have with your health care provider.   Document Released: 07/22/2004 Document Revised: 10/29/2014 Document Reviewed: 06/27/2014 Elsevier Interactive Patient Education 2016 [employer]   - Back Exercises If you have pain in your back, do these exercises 2-3 times each day or as told by your doctor. When the pain goes away, do  the exercises once each day, but repeat the steps more times for each  exercise (do more repetitions). If you do not have pain in your back, do these exercises once each day or as told by your doctor. EXERCISES Single Knee to Chest Do these steps 3-5 times in a row for each leg: 1. Lie on your back on a firm bed or the floor with your legs stretched out. 2. Bring one knee to your chest. 3. Hold your knee to your chest by grabbing your knee or thigh. 4. Pull on your knee until you feel a gentle stretch in your lower back. 5. Keep doing the stretch for 10-30 seconds. 6. Slowly let go of your leg and straighten it. Pelvic Tilt Do these steps 5-10 times in a row: 1. Lie on your back on a firm bed or the floor with your legs stretched out. 2. Bend your knees so they point up to the ceiling. Your feet should be flat on the floor. 3. Tighten your lower belly (abdomen) muscles to press your lower back against the floor. This will make your tailbone point up to the ceiling instead of pointing down to your feet or the floor. 4. Stay in this position for 5-10 seconds while you gently tighten your muscles and breathe evenly. Cat-Cow Do these steps until your lower back bends more easily: 1. Get on your hands and knees on a firm surface. Keep your hands under your shoulders, and keep your knees under your hips. You may put padding under your knees. 2. Let your head hang down, and make your tailbone point down to the floor so your lower back is round like the back of a cat. 3. Stay in this position for 5 seconds. 4. Slowly lift your head and make your tailbone point up to the ceiling so your back hangs low (sags) like the back of a cow. 5. Stay in this position for 5 seconds. Press-Ups Do these steps 5-10 times in a row: 1. Lie on your belly (face-down) on the floor. 2. Place your hands near your head, about shoulder-width apart. 3. While you keep your back relaxed and keep your hips on the floor, slowly straighten your arms to raise the top half of your body and lift your  shoulders. Do not use your back muscles. To make yourself more comfortable, you may change where you place your hands. 4. Stay in this position for 5 seconds. 5. Slowly return to lying flat on the floor. Bridges Do these steps 10 times in a row: 1. Lie on your back on a firm surface. 2. Bend your knees so they point up to the ceiling. Your feet should be flat on the floor. 3. Tighten your butt muscles and lift your butt off of the floor until your waist is almost as high as your knees. If you do not feel the muscles working in your butt and the back of your thighs, slide your feet 1-2 inches farther away from your butt. 4. Stay in this position for 3-5 seconds. 5. Slowly lower your butt to the floor, and let your butt muscles relax. If this exercise is too easy, try doing it with your arms crossed over your chest. Belly Crunches Do these steps 5-10 times in a row: 1. Lie on your back on a firm bed or the floor with your legs stretched out. 2. Bend your knees so they point up to the ceiling. Your feet should be flat on the floor.  3. Cross your arms over your chest. 4. Tip your chin a little bit toward your chest but do not bend your neck. 5. Tighten your belly muscles and slowly raise your chest just enough to lift your shoulder blades a tiny bit off of the floor. 6. Slowly lower your chest and your head to the floor. Back Lifts Do these steps 5-10 times in a row: 1. Lie on your belly (face-down) with your arms at your sides, and rest your forehead on the floor. 2. Tighten the muscles in your legs and your butt. 3. Slowly lift your chest off of the floor while you keep your hips on the floor. Keep the back of your head in line with the curve in your back. Look at the floor while you do this. 4. Stay in this position for 3-5 seconds. 5. Slowly lower your chest and your face to the floor. GET HELP IF:  Your back pain gets a lot worse when you do an exercise.  Your back pain does not lessen  2 hours after you exercise. If you have any of these problems, stop doing the exercises. Do not do them again unless your doctor says it is okay. GET HELP RIGHT AWAY IF:  You have sudden, very bad back pain. If this happens, stop doing the exercises. Do not do them again unless your doctor says it is okay.   This information is not intended to replace advice given to you by your health care provider. Make sure you discuss any questions you have with your health care provider.   Document Released: 07/17/2010 Document Revised: 03/05/2015 Document Reviewed: 08/08/2014 Elsevier Interactive Patient Education [employer].

## 2016-02-17 ENCOUNTER — Ambulatory Visit: Payer: Self-pay

## 2016-02-24 ENCOUNTER — Encounter: Payer: Self-pay | Admitting: Internal Medicine

## 2016-03-09 ENCOUNTER — Ambulatory Visit: Payer: Self-pay | Admitting: Internal Medicine

## 2016-04-13 ENCOUNTER — Encounter (HOSPITAL_COMMUNITY): Payer: Self-pay | Admitting: *Deleted

## 2016-04-13 ENCOUNTER — Emergency Department (HOSPITAL_COMMUNITY): Payer: Self-pay

## 2016-04-13 ENCOUNTER — Emergency Department (HOSPITAL_COMMUNITY)
Admission: EM | Admit: 2016-04-13 | Discharge: 2016-04-13 | Disposition: A | Discharge status: Home / Self Care | Payer: Self-pay | Attending: Emergency Medicine | Admitting: Emergency Medicine

## 2016-04-13 DIAGNOSIS — Z21 Asymptomatic human immunodeficiency virus [HIV] infection status: Secondary | ICD-10-CM | POA: Insufficient documentation

## 2016-04-13 DIAGNOSIS — R0789 Other chest pain: Secondary | ICD-10-CM | POA: Insufficient documentation

## 2016-04-13 DIAGNOSIS — Z85048 Personal history of other malignant neoplasm of rectum, rectosigmoid junction, and anus: Secondary | ICD-10-CM | POA: Insufficient documentation

## 2016-04-13 DIAGNOSIS — F17210 Nicotine dependence, cigarettes, uncomplicated: Secondary | ICD-10-CM | POA: Insufficient documentation

## 2016-04-13 DIAGNOSIS — Z79899 Other long term (current) drug therapy: Secondary | ICD-10-CM | POA: Insufficient documentation

## 2016-04-13 LAB — URINALYSIS, ROUTINE W REFLEX MICROSCOPIC
BILIRUBIN URINE: NEGATIVE
GLUCOSE, UA: NEGATIVE mg/dL
HGB URINE DIPSTICK: NEGATIVE
Ketones, ur: NEGATIVE mg/dL
Leukocytes, UA: NEGATIVE
Nitrite: NEGATIVE
PROTEIN: NEGATIVE mg/dL
Specific Gravity, Urine: 1.014 (ref 1.005–1.030)
pH: 5.5 (ref 5.0–8.0)

## 2016-04-13 MED ORDER — OXYCODONE-ACETAMINOPHEN 5-325 MG PO TABS
1.0000 | ORAL_TABLET | Freq: Once | ORAL | Status: AC
  Administered 2016-04-13: 1 via ORAL
  Filled 2016-04-13: qty 1

## 2016-04-13 NOTE — ED Triage Notes (Addendum)
Patient c/o left flank pain which began acutely around 5 am.  Pain is worse with movement and urination.  Patient denies hematuria.  Patient endorses pain with palpation of left flank and +CVA tenderness.    Patient denies recent heavy lifting or trauma.  Patient has hx of displaced fx of L2 in April of this year.  He states current pain is similar to what he experienced then.

## 2016-04-13 NOTE — ED Notes (Signed)
Patient states pain started after "one big cough, felt like something popped over there".

## 2016-04-13 NOTE — ED Provider Notes (Signed)
[facility] Provider Note   CSN: [id number] Arrival date & time: 04/13/16  0700     History   Chief Complaint Chief Complaint  Patient presents with  . Flank Pain    HPI [place] GUNNAR RAZZAK is a 40 y.o. male.  He was at home today when he coughed and developed pain in his left chest wall. The pain reminded him of when he had a lumbar compression fracture, so he came here for evaluation. Has ongoing, cough, fever, chills, nausea, vomiting, weakness or dizziness. There are no other known modifying factors.   HPI  Past Medical History:  Diagnosis Date  . Anxiety   . Blood dyscrasia    HIV  . Chronic back pain   . Depression   . DJD (degenerative joint disease)   . Headache(784.0)   . HIV infection [facility])     Patient Active Problem List   Diagnosis Date Noted  . Lumbar transverse process fracture ([place]) 10/02/2015  . Perirectal abscess 03/13/2015  . Screening examination for sexually transmitted disease 04/04/2014  . Seizures ([place]) 08/30/2013  . Personal history of digestive disease 01/15/2013  . Thoracic or lumbosacral neuritis or radiculitis 01/15/2013  . H/O gastrointestinal disease 01/15/2013  . Lumbar radiculopathy 01/15/2013  . 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 Condyloma acuminatum 11/17/2012  . 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 AGW (anogenital warts) 11/17/2012  . Abscess of anal and rectal regions 10/02/2012  . Abscess, perirectal 10/02/2012  . Hemorrhoid 09/27/2012  . BRBPR (bright red blood per rectum) 07/27/2012  . Prostatitis 04/12/2012  . Constipation 03/09/2012  . Callus 08/16/2011  . Ingrown toenail 08/16/2011  . Chronic low back pain 08/16/2011  . DYSLIPIDEMIA 09/03/2010  . 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 Erectile dysfunction 07/06/2010  . ANXIETY DEPRESSION 05/06/2010  . BURSITIS, KNEE 05/06/2010  . Human immunodeficiency virus (HIV) disease ([place]) 04/16/2010    Past Surgical History:  Procedure Laterality  Date  . ESOPHAGOGASTRODUODENOSCOPY  03/27/2012   Procedure: ESOPHAGOGASTRODUODENOSCOPY (EGD);  Surgeon: Lear Ng, MD;  Location: [facility];  Service: Endoscopy;  Laterality: N/A;  . IRRIGATION AND DEBRIDEMENT ABSCESS N/A 03/13/2015   Procedure: IRRIGATION AND DEBRIDEMENT ABSCESS;  Surgeon: Johnathan Hausen, MD;  Location: [facility];  Service: General;  Laterality: N/A;  . WISDOM TOOTH EXTRACTION         Home Medications    Prior to Admission medications   Medication Sig Start Date End Date Taking? Authorizing Provider  gabapentin (NEURONTIN) 300 MG capsule Take 300 mg by mouth 3 (three) times daily. 03/24/16  Yes Historical Provider, MD  GENVOYA 150-150-200-10 MG TABS tablet TAKE 1 TABLET BY MOUTH EVERY DAY WITH PREZISTA Patient taking differently: TAKE 1 TABLET BY MOUTH NIGHTLY WITH PREZISTA 10/28/15  Yes Thayer Headings, MD  omeprazole (PRILOSEC) 20 MG capsule TAKE 1 CAPSULE BY MOUTH DAILY Patient taking differently: TAKE 20 MG BY MOUTH EVERY NIGHT 12/01/15  Yes Thayer Headings, MD  PREZISTA 800 MG tablet TAKE 1 TABLET BY MOUTH EVERY DAY Patient taking differently: TAKE 800 MG BY MOUTH EVERY NIGHT 10/28/15  Yes Thayer Headings, MD  traZODone (DESYREL) 100 MG tablet Take 300 mg by mouth at bedtime.  07/24/14  Yes Historical Provider, MD  valACYclovir (VALTREX) 500 MG tablet TAKE 1 TABLET BY MOUTH TWICE DAILY AS NEEDED FOR FLARE UPS Patient taking differently: TAKE 500 MG BY MOUTH TWICE DAILY AS NEEDED FOR FLARE UPS 01/02/16  Yes Thayer Headings, MD  cyclobenzaprine (  FLEXERIL) 10 MG tablet Take 1 tablet (10 mg total) by mouth 3 (three) times daily as needed for muscle spasms. Patient not taking: Reported on 04/13/2016 02/16/16   Maren Reamer, MD  escitalopram (LEXAPRO) 20 MG tablet Take 1 tablet (20 mg total) by mouth daily. Patient not taking: Reported on 04/13/2016 10/18/13   Thayer Headings, MD  furosemide (LASIX) 40 MG tablet Take 1 tablet (40 mg total) by mouth daily. Patient not taking:  Reported on 04/13/2016 10/13/15   Maren Reamer, MD  hydrOXYzine (ATARAX/VISTARIL) 25 MG tablet Take 1 tablet (25 mg total) by mouth every 8 (eight) hours as needed for anxiety. Patient not taking: Reported on 04/13/2016 11/12/15   Delsa Grana, PA-C  LORazepam (ATIVAN) 0.5 MG tablet Take 1 tablet (0.5 mg total) by mouth every 8 (eight) hours as needed for anxiety (for anxiety/panic attack). Patient not taking: Reported on 04/13/2016 11/12/15   Delsa Grana, PA-C    Family History Family History  Problem Relation Age of Onset  . Cancer Mother     laryngeal  . Pneumonia Father   . Diabetes Father   . Hypertension Sister   . Crohn's disease Brother   . Crohn's disease Maternal Grandmother   . Cancer Maternal Grandmother     patient unsure of type  . Colon cancer Maternal Grandfather     Social History Social History  Substance Use Topics  . Smoking status: Current Every Day Smoker    Packs/day: 0.50    Types: Cigarettes  . Smokeless tobacco: Never Used  . Alcohol use 1.2 oz/week    2 Standard drinks or equivalent per week     Comment: beer at times. not often     Allergies   Review of patient's allergies indicates no known allergies.   Review of Systems Review of Systems  All other systems reviewed and are negative.    Physical Exam Updated Vital Signs BP 115/82 (BP Location: Left Arm)   Pulse 69   Temp 98 F (36.7 C) (Oral)   Resp 18   Ht 5\' 8"  (1.727 m)   Wt 225 lb (102.1 kg)   SpO2 99%   BMI 34.21 kg/m   Physical Exam  Constitutional: He is oriented to person, place, and time. He appears well-developed and well-nourished.  HENT:  Head: Normocephalic and atraumatic.  Right Ear: External ear normal.  Left Ear: External ear normal.  Eyes: Conjunctivae and EOM are normal. Pupils are equal, round, and reactive to light.  Neck: Normal range of motion and phonation normal. Neck supple.  Cardiovascular: Normal rate, regular rhythm and normal heart sounds.     Pulmonary/Chest: Effort normal and breath sounds normal. He exhibits tenderness (Left chest, diffuse, without crepitation or deformity.). He exhibits no bony tenderness.  Abdominal: Soft. There is no tenderness.  Musculoskeletal: Normal range of motion.  Neurological: He is alert and oriented to person, place, and time. No cranial nerve deficit or sensory deficit. He exhibits normal muscle tone. Coordination normal.  Skin: Skin is warm, dry and intact.  Psychiatric: He has a normal mood and affect. His behavior is normal. Judgment and thought content normal.  Nursing note and vitals reviewed.    ED Treatments / Results  Labs (all labs ordered are listed, but only abnormal results are displayed) Labs Reviewed  URINALYSIS, ROUTINE W REFLEX MICROSCOPIC (NOT AT [facility])    EKG  EKG Interpretation None       Radiology Dg Ribs Unilateral W/chest Left  Result Date: 04/13/2016 CLINICAL DATA:  Acute onset pain, left lower chest and flank region EXAM: LEFT RIBS AND CHEST - 3+ VIEW COMPARISON:  Chest radiograph Nov 12, 2015 FINDINGS: Frontal chest as well as oblique and cone-down lower rib images obtained. Lungs are clear. Heart size and pulmonary vascularity are normal. No adenopathy. There is no demonstrable pneumothorax or pleural effusion. No rib fractures are evident. IMPRESSION: No demonstrable fracture.  No pneumothorax.  Lungs clear. Electronically Signed   By: Lowella Grip III M.D.   On: 04/13/2016 09:29   Dg Lumbar Spine Complete  Result Date: 04/13/2016 CLINICAL DATA:  Acute onset left-sided pain EXAM: LUMBAR SPINE - COMPLETE 4+ VIEW COMPARISON:  September 29, 2015 FINDINGS: Frontal, lateral, spot lumbosacral lateral, and bilateral oblique views were obtained. There are 5 non-rib-bearing lumbar type vertebral bodies. The patient has had prior fractures of the left L1 and L2 transverse processes with healing in these areas. No acute fracture is evident. There is no spondylolisthesis.  Disc spaces are unremarkable. There is no appreciable facet arthropathy. IMPRESSION: There has been interval healing of prior fractures of the L1 and L2 left transverse processes. No acute fracture evident. No spondylolisthesis. No apparent arthropathy. Electronically Signed   By: Lowella Grip III M.D.   On: 04/13/2016 09:32    Procedures Procedures (including critical care time)  Medications Ordered in ED Medications  oxyCODONE-acetaminophen (PERCOCET/ROXICET) 5-325 MG per tablet 1 tablet (1 tablet Oral Given 04/13/16 0859)     Initial Impression / Assessment and Plan / ED Course  I have reviewed the triage vital signs and the nursing notes.  Pertinent labs & imaging results that were available during my care of the patient were reviewed by me and considered in my medical decision making (see chart for details).  Clinical Course    Medications  oxyCODONE-acetaminophen (PERCOCET/ROXICET) 5-325 MG per tablet 1 tablet (1 tablet Oral Given 04/13/16 0859)    Patient Vitals for the past 24 hrs:  BP Temp Temp src Pulse Resp SpO2 Height Weight  04/13/16 0859 115/82 - - 69 18 99 % - -  04/13/16 0737 - - - - - - 5\' 8"  (1.727 m) 225 lb (102.1 kg)  04/13/16 0733 118/74 98 F (36.7 C) Oral 77 18 98 % - -    9:55 AM Reevaluation with update and discussion. After initial assessment and treatment, an updated evaluation reveals no change in clinical status. Patient is comfortable now. Findings discussed with patient and all questions answered. Deantre Bourdon L    Final Clinical Impressions(s) / ED Diagnoses   Final diagnoses:  Chest wall pain   Chest wall pain without evidence for pneumonia, fracture or metabolic instability.  Nursing Notes Reviewed/ Care Coordinated Applicable Imaging Reviewed Interpretation of Laboratory Data incorporated into ED treatment  The patient appears reasonably screened and/or stabilized for discharge and I doubt any other medical condition or other [facility]  requiring further screening, evaluation, or treatment in the ED at this time prior to discharge.  Plan: Home Medications- OTC analgesia; Home Treatments- rest, heat; return here if the recommended treatment, does not improve the symptoms; Recommended follow up- PCP prn   New Prescriptions New Prescriptions   No medications on file     Daleen Bo, MD 04/13/16 1008

## 2016-04-13 NOTE — Discharge Instructions (Signed)
Use heat on the sore area 3 or 4 times a day and take either ibuprofen or acetaminophen for pain.

## 2016-04-15 ENCOUNTER — Other Ambulatory Visit: Payer: Self-pay | Admitting: Internal Medicine

## 2016-05-12 ENCOUNTER — Other Ambulatory Visit: Payer: Self-pay

## 2016-05-12 DIAGNOSIS — B20 Human immunodeficiency virus [HIV] disease: Secondary | ICD-10-CM

## 2016-05-12 DIAGNOSIS — Z79899 Other long term (current) drug therapy: Secondary | ICD-10-CM

## 2016-05-12 LAB — LIPID PANEL
CHOL/HDL RATIO: 3.8 ratio (ref <5.0)
CHOLESTEROL: 240 mg/dL — AB (ref <200)
HDL: 63 mg/dL (ref >40)
LDL Cholesterol: 149 mg/dL — ABNORMAL HIGH (ref <100)
Triglycerides: 141 mg/dL (ref <150)
VLDL: 28 mg/dL (ref <30)

## 2016-05-12 LAB — CBC
HEMATOCRIT: 47.1 % (ref 38.5–50.0)
HEMOGLOBIN: 16.3 g/dL (ref 13.2–17.1)
MCH: 31.2 pg (ref 27.0–33.0)
MCHC: 34.6 g/dL (ref 32.0–36.0)
MCV: 90.2 fL (ref 80.0–100.0)
MPV: 10.1 fL (ref 7.5–12.5)
PLATELETS: 229 10*3/uL (ref 140–400)
RBC: 5.22 MIL/uL (ref 4.20–5.80)
RDW: 13.9 % (ref 11.0–15.0)
WBC: 8 10*3/uL (ref 3.8–10.8)

## 2016-05-12 LAB — BASIC METABOLIC PANEL
BUN: 14 mg/dL (ref 7–25)
CHLORIDE: 103 mmol/L (ref 98–110)
CO2: 26 mmol/L (ref 20–31)
CREATININE: 1.42 mg/dL — AB (ref 0.60–1.35)
Calcium: 9.8 mg/dL (ref 8.6–10.3)
Glucose, Bld: 83 mg/dL (ref 65–99)
POTASSIUM: 4.3 mmol/L (ref 3.5–5.3)
Sodium: 138 mmol/L (ref 135–146)

## 2016-05-13 ENCOUNTER — Other Ambulatory Visit: Payer: Self-pay | Admitting: Internal Medicine

## 2016-05-13 DIAGNOSIS — K219 Gastro-esophageal reflux disease without esophagitis: Secondary | ICD-10-CM

## 2016-05-13 LAB — T-HELPER CELL (CD4) - (RCID CLINIC ONLY)
CD4 % Helper T Cell: 27 % — ABNORMAL LOW (ref 33–55)
CD4 T Cell Abs: 790 /uL (ref 400–2700)

## 2016-05-14 LAB — HIV-1 RNA QUANT-NO REFLEX-BLD

## 2016-05-31 ENCOUNTER — Ambulatory Visit (INDEPENDENT_AMBULATORY_CARE_PROVIDER_SITE_OTHER): Payer: Self-pay | Admitting: Internal Medicine

## 2016-05-31 ENCOUNTER — Encounter: Payer: Self-pay | Admitting: Internal Medicine

## 2016-05-31 VITALS — BP 120/81 | HR 73 | Temp 98.0°F | Wt 247.0 lb

## 2016-05-31 DIAGNOSIS — K6282 Dysplasia of anus: Secondary | ICD-10-CM

## 2016-05-31 DIAGNOSIS — D013 Carcinoma in situ of anus and anal canal: Secondary | ICD-10-CM

## 2016-05-31 DIAGNOSIS — Z113 Encounter for screening for infections with a predominantly sexual mode of transmission: Secondary | ICD-10-CM

## 2016-05-31 DIAGNOSIS — Z720 Tobacco use: Secondary | ICD-10-CM

## 2016-05-31 DIAGNOSIS — B20 Human immunodeficiency virus [HIV] disease: Secondary | ICD-10-CM

## 2016-05-31 NOTE — Assessment & Plan Note (Signed)
Followed by surgery and no issues by his report

## 2016-05-31 NOTE — Assessment & Plan Note (Signed)
Not interested in quitting at this time.  I did discuss risks - cancer, lung disease.

## 2016-05-31 NOTE — Progress Notes (Signed)
CC: Follow up for HIV  Interval history: Currently is asymptomatic and well-controlled on Genvoya and Prezista.  Last seen about 6 months ago and since last visit he has been taking his medications well.  Has no associated weight loss.  Denies missed doses.     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 Continues to smoke and is precontemplative.  States he likes it too much.   Prior to Admission medications   Medication Sig Start Date End Date Taking? Authorizing Provider  acetaminophen (TYLENOL) 500 MG tablet Take 1 tablet (500 mg total) by mouth every 6 (six) hours as needed. Patient not taking: Reported on 03/13/2015 01/18/14   Tresa Garter, MD  ARIPiprazole (ABILIFY) 5 MG tablet Take 5 mg by mouth daily.    Historical Provider, MD  elvitegravir-cobicistat-emtricitabine-tenofovir (GENVOYA) 150-150-200-10 MG TABS tablet TAKE 1 TABLET BY MOUTH EVERY DAY WITH PREZISTA 05/16/15   Thayer Headings, MD  escitalopram (LEXAPRO) 20 MG tablet Take 1 tablet (20 mg total) by mouth daily. 10/18/13   Thayer Headings, MD  furosemide (LASIX) 20 MG tablet Take 1 tablet (20 mg total) by mouth daily. 08/25/15   Lance Bosch, NP  methocarbamol (ROBAXIN) 500 MG tablet Take 1 tablet (500 mg total) by mouth 2 (two) times daily. 09/29/15   Antonietta Breach, PA-C  omeprazole (PRILOSEC) 20 MG capsule TAKE 1 CAPSULE BY MOUTH DAILY 07/02/15   Thayer Headings, MD  oxyCODONE-acetaminophen (PERCOCET/ROXICET) 5-325 MG tablet Take 1-2 tablets by mouth every 6 (six) hours as needed for severe pain. 09/29/15   Antonietta Breach, PA-C  potassium chloride (K-DUR) 10 MEQ tablet Take 1 tablet (10 mEq total) by mouth daily. 08/25/15   Lance Bosch, NP  predniSONE (DELTASONE) 20 MG tablet Take 2 tablets (40 mg total) by mouth daily. 09/29/15   Antonietta Breach, PA-C  PREZISTA 800 MG tablet TAKE 1 TABLET BY MOUTH EVERY DAY 09/26/15   Thayer Headings, MD  sulfamethoxazole-trimethoprim (BACTRIM DS,SEPTRA DS) 800-160 MG per tablet Take 1 tablet by mouth 2  (two) times daily. 03/14/15   Alphonsa Overall, MD  traZODone (DESYREL) 100 MG tablet Take 100 mg by mouth at bedtime as needed for sleep.  07/24/14   Historical Provider, MD  valACYclovir (VALTREX) 500 MG tablet Take 1 tablet (500 mg total) by mouth 2 (two) times daily. Take by mouth. Take 500 mg by mouth 2 (two) times daily as needed. For flare-ups. 10/08/14   Thayer Headings, MD    Review of Systems Constitutional: negative for fevers and chills Gastrointestinal: negative for nausea and diarrhea All other systems reviewed and are negative   Physical Exam: CONSTITUTIONAL:in no apparent distress and alert  Vitals:   05/31/16 1051  BP: 120/81  Pulse: 73  Temp: 98 F (36.7 C)   Eyes: anicteric HENT: no thrush, no cervical lymphadenopathy Respiratory: Normal respiratory effort; CTA B Cardiovascular: RRR  Lab Results  Component Value Date   HIV1RNAQUANT <20 05/12/2016   HIV1RNAQUANT <20 10/02/2015   HIV1RNAQUANT <20 04/28/2015   Social History   Social History  . Marital status: Divorced    Spouse name: N/A  . Number of children: 1  . Years of education: N/A   Occupational History  . Disabled    Social History Main Topics  . Smoking status: Current Every Day Smoker    Packs/day: 0.50    Types: Cigarettes  . Smokeless tobacco: Never Used  . Alcohol use 1.2 oz/week    2 Standard drinks  or equivalent per week     Comment: beer at times. not often  . Drug use:     Frequency: 7.0 times per week    Types: Marijuana     Comment: occ  . Sexual activity: Not Currently    Partners: Male     Comment: accepted condoms   Other Topics Concern  . Not on file   Social History Narrative  . No narrative on file

## 2016-05-31 NOTE — Assessment & Plan Note (Signed)
Doing good with this, rtc 6 months.

## 2016-06-09 ENCOUNTER — Emergency Department (HOSPITAL_COMMUNITY)
Admission: EM | Admit: 2016-06-09 | Discharge: 2016-06-09 | Disposition: A | Discharge status: Home / Self Care | Payer: Self-pay | Attending: Emergency Medicine | Admitting: Emergency Medicine

## 2016-06-09 DIAGNOSIS — F17210 Nicotine dependence, cigarettes, uncomplicated: Secondary | ICD-10-CM | POA: Insufficient documentation

## 2016-06-09 DIAGNOSIS — M62830 Muscle spasm of back: Secondary | ICD-10-CM | POA: Insufficient documentation

## 2016-06-09 DIAGNOSIS — Z79899 Other long term (current) drug therapy: Secondary | ICD-10-CM | POA: Insufficient documentation

## 2016-06-09 LAB — URINALYSIS, ROUTINE W REFLEX MICROSCOPIC
BILIRUBIN URINE: NEGATIVE
Glucose, UA: NEGATIVE mg/dL
HGB URINE DIPSTICK: NEGATIVE
KETONES UR: NEGATIVE mg/dL
Leukocytes, UA: NEGATIVE
NITRITE: NEGATIVE
Protein, ur: NEGATIVE mg/dL
Specific Gravity, Urine: 1.018 (ref 1.005–1.030)
pH: 6 (ref 5.0–8.0)

## 2016-06-09 MED ORDER — DIAZEPAM 2 MG PO TABS
2.0000 mg | ORAL_TABLET | Freq: Once | ORAL | Status: AC
  Administered 2016-06-09: 2 mg via ORAL
  Filled 2016-06-09: qty 1

## 2016-06-09 MED ORDER — CYCLOBENZAPRINE HCL 10 MG PO TABS: 10.0000 mg | ORAL_TABLET | Freq: Two times a day (BID) | ORAL | 0 refills | Status: DC | PRN

## 2016-06-09 NOTE — Discharge Instructions (Signed)
Please take the Flexeril for muscle spasms. Please continue to take Tylenol and Motrin for pain. Use the back exercises as given. Soak in a warm bath with Epson salt. Use a heating pad on her lower back. Follow-up with her primary care doctor in the next 2-3 days ago symptoms have not improved. Return to the ED if you develop numbness in your groin, loss control of your bowel or bladder, unable to urinate, fevers or for any other reason.

## 2016-06-09 NOTE — ED Triage Notes (Signed)
Pt states that he has a hx of back spasms that some times flares up. Today it has been hurting in his lower back since 4am. Alert and oriented.

## 2016-06-09 NOTE — ED Provider Notes (Signed)
[facility] DEPT Provider Note   CSN: [id number] Arrival date & time: 06/09/16  1733   By signing my name below, I, Neta Mends, attest that this documentation has been prepared under the direction and in the presence of Melina Schools, PA-C. Electronically Signed: Neta Mends, ED Scribe. 06/09/2016. 6:04 PM.   History   Chief Complaint Chief Complaint  Patient presents with  . Back Pain    The history is provided by the patient. No language interpreter was used.   HPI Comments:  Shawn Brooks is a 40 y.o. male with PMHx of HIV who presents to the Emergency Department complaining of constant right-sided lower back pain since 0400 today. Pt reports hx of back spasms and that he has flare-ups often. Pt describes the pain as spasms and cramps. Pt rates the pain at 7/10 at rest and 10/10 while walking. Pt states that he has had back spasms since he had a fall in 1999. Pt complains of associated dysuria x 1 week. Pt reports that he fell and broke a bone in his back 5 months ago and seen in ED, but denies any other recent injuries. Pt took 2 Aleve this AM and used Deep Blue with mild relief. Pt denies IV drug use or history of caner. Pt denies penile discharge, saddle anesthesia, abnormal BM, fever, bowel/bladder incontinence, urinary incontinence, numbness or tingling, hx of ivdu, or hx of cancer.  Past Medical History:  Diagnosis Date  . Anxiety   . Blood dyscrasia    HIV  . Chronic back pain   . Depression   . DJD (degenerative joint disease)   . Headache(784.0)   . HIV infection [facility])     Patient Active Problem List   Diagnosis Date Noted  . Tobacco abuse 05/31/2016  . Lumbar transverse process fracture ([place]) 10/02/2015  . Screening examination for sexually transmitted disease 04/04/2014  . Seizures ([place]) 08/30/2013  . Personal history of digestive disease 01/15/2013  . Thoracic or lumbosacral neuritis or radiculitis 01/15/2013  . H/O gastrointestinal  disease 01/15/2013  . Lumbar radiculopathy 01/15/2013  . 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 Condyloma acuminatum 11/17/2012  . 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 AGW (anogenital warts) 11/17/2012  . Hemorrhoid 09/27/2012  . BRBPR (bright red blood per rectum) 07/27/2012  . Prostatitis 04/12/2012  . Constipation 03/09/2012  . Callus 08/16/2011  . Ingrown toenail 08/16/2011  . Chronic low back pain 08/16/2011  . DYSLIPIDEMIA 09/03/2010  . 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 Erectile dysfunction 07/06/2010  . ANXIETY DEPRESSION 05/06/2010  . BURSITIS, KNEE 05/06/2010  . Human immunodeficiency virus (HIV) disease ([place]) 04/16/2010    Past Surgical History:  Procedure Laterality Date  . ESOPHAGOGASTRODUODENOSCOPY  03/27/2012   Procedure: ESOPHAGOGASTRODUODENOSCOPY (EGD);  Surgeon: Lear Ng, MD;  Location: [facility];  Service: Endoscopy;  Laterality: N/A;  . IRRIGATION AND DEBRIDEMENT ABSCESS N/A 03/13/2015   Procedure: IRRIGATION AND DEBRIDEMENT ABSCESS;  Surgeon: Johnathan Hausen, MD;  Location: [facility] ORS;  Service: General;  Laterality: N/A;  . WISDOM TOOTH EXTRACTION         Home Medications    Prior to Admission medications   Medication Sig Start Date End Date Taking? Authorizing Provider  gabapentin (NEURONTIN) 300 MG capsule Take 300 mg by mouth 3 (three) times daily. 03/24/16   Historical Provider, MD  GENVOYA 150-150-200-10 MG TABS tablet TAKE 1 TABLET BY MOUTH EVERY DAY WITH PREZISTA 04/15/16   Thayer Headings, MD  LORazepam (  ATIVAN) 0.5 MG tablet Take 1 tablet (0.5 mg total) by mouth every 8 (eight) hours as needed for anxiety (for anxiety/panic attack). Patient not taking: Reported on 05/31/2016 11/12/15   Delsa Grana, PA-C  omeprazole (PRILOSEC) 20 MG capsule TAKE 1 CAPSULE BY MOUTH DAILY 05/13/16   Thayer Headings, MD  PREZISTA 800 MG tablet TAKE 1 TABLET BY MOUTH EVERY DAY 04/15/16   Thayer Headings, MD  traZODone (DESYREL) 100 MG tablet Take  300 mg by mouth at bedtime.  07/24/14   Historical Provider, MD  valACYclovir (VALTREX) 500 MG tablet TAKE 1 TABLET BY MOUTH TWICE DAILY AS NEEDED FOR FLARE UPS Patient taking differently: TAKE 500 MG BY MOUTH TWICE DAILY AS NEEDED FOR FLARE UPS 01/02/16   Thayer Headings, MD    Family History Family History  Problem Relation Age of Onset  . Cancer Mother     laryngeal  . Pneumonia Father   . Diabetes Father   . Hypertension Sister   . Crohn's disease Brother   . Crohn's disease Maternal Grandmother   . Cancer Maternal Grandmother     patient unsure of type  . Colon cancer Maternal Grandfather     Social History Social History  Substance Use Topics  . Smoking status: Current Every Day Smoker    Packs/day: 0.50    Types: Cigarettes  . Smokeless tobacco: Never Used  . Alcohol use 1.2 oz/week    2 Standard drinks or equivalent per week     Comment: beer at times. not often     Allergies   Patient has no known allergies.   Review of Systems Review of Systems  Constitutional: Negative for fever.  Gastrointestinal: Negative for diarrhea.  Genitourinary: Positive for dysuria. Negative for discharge.  Musculoskeletal: Positive for back pain.  Neurological: Negative for numbness.  All other systems reviewed and are negative.    Physical Exam Updated Vital Signs BP 107/84 (BP Location: Left Arm)   Pulse 98   Temp 98.8 F (37.1 C) (Oral)   Resp 18   Ht 5\' 8"  (1.727 m)   Wt 239 lb 3 oz (108.5 kg)   SpO2 99%   BMI 36.37 kg/m   Physical Exam  Constitutional: He appears well-developed and well-nourished. No distress.  Pt appears uncomfortable in chair.   HENT:  Head: Normocephalic and atraumatic.  Eyes: Conjunctivae are normal. Pupils are equal, round, and reactive to light.  Neck: Normal range of motion. Neck supple.  No midline c-spine tenderness, no deformity or stepoff.  Cardiovascular: Normal rate.   Pulmonary/Chest: Effort normal.  Abdominal: He exhibits no  distension.  Genitourinary: Testes normal and penis normal. Right testis shows no tenderness. Left testis shows no tenderness. Circumcised. No penile tenderness. No discharge found.  Genitourinary Comments: Chaperon present for exam  Musculoskeletal:  No T-spine or L-spine midline tenderness. Right paraspinal tenderness of the L spine to palpation with tense musculature and spasms noted. Slight tenderness to left L spine paraspinal muscle. Full ROM. No deformities or stepoffs noted.   Neurological: He is alert.  Skin: Skin is warm and dry.  Psychiatric: He has a normal mood and affect.  Nursing note and vitals reviewed.    ED Treatments / Results  DIAGNOSTIC STUDIES:  Oxygen Saturation is 99% on RA, normal by my interpretation.    COORDINATION OF CARE:  6:04 PM Discussed treatment plan with pt at bedside and pt agreed to plan.   Labs (all labs ordered are listed, but only  abnormal results are displayed) Labs Reviewed  URINALYSIS, ROUTINE W REFLEX MICROSCOPIC  GC/CHLAMYDIA PROBE AMP () NOT AT [facility]    EKG  EKG Interpretation None       Radiology No results found.  Procedures Procedures (including critical care time)  Medications Ordered in ED Medications  diazepam (VALIUM) tablet 2 mg (2 mg Oral Given 06/09/16 1820)     Initial Impression / Assessment and Plan / ED Course  I have reviewed the triage vital signs and the nursing notes.  Pertinent labs & imaging results that were available during my care of the patient were reviewed by me and considered in my medical decision making (see chart for details).  Clinical Course   Patient with back pain.  No neurological deficits and normal neuro exam.  Patient can walk but states is painful.  No loss of bowel or bladder control.  No concern for cauda equina.  No fever, night sweats, weight loss, h/o cancer, IVDU.  UA unremarkable without signs of infection. GC/chy probe pending. Exam without signs of discharge  doubt std. Back pain likely due to muscle spasm. Pain relieved with valium. RICE protocol and pain medicine indicated and discussed with patient. Pt is hemodynamically stable, in NAD, & able to ambulate in the ED. Pain has been managed & has no complaints prior to dc. Pt is comfortable with above plan and is stable for discharge at this time. All questions were answered prior to disposition. Strict return precautions for f/u to the ED were discussed.    Final Clinical Impressions(s) / ED Diagnoses   Final diagnoses:  Muscle spasm of back    New Prescriptions Discharge Medication List as of 06/09/2016  6:55 PM    START taking these medications   Details  cyclobenzaprine (FLEXERIL) 10 MG tablet Take 1 tablet (10 mg total) by mouth 2 (two) times daily as needed for muscle spasms., Starting Wed 06/09/2016, Print      I personally performed the services described in this documentation, which was scribed in my presence. The recorded information has been reviewed and is accurate.     Doristine Devoid, PA-C 06/10/16 2318    Orlie Dakin, MD 06/21/16 [phone]

## 2016-06-09 NOTE — ED Notes (Signed)
Patient was alert, oriented and stable upon discharge. RN went over AVS and patient had no further questions.  

## 2016-06-10 LAB — GC/CHLAMYDIA PROBE AMP (~~LOC~~) NOT AT ARMC
CHLAMYDIA, DNA PROBE: NEGATIVE
Neisseria Gonorrhea: NEGATIVE

## 2016-07-12 ENCOUNTER — Ambulatory Visit: Payer: Self-pay | Attending: Internal Medicine | Admitting: Internal Medicine

## 2016-07-12 ENCOUNTER — Encounter: Payer: Self-pay | Admitting: Internal Medicine

## 2016-07-12 VITALS — BP 117/77 | HR 73 | Temp 98.1°F | Resp 16 | Wt 253.6 lb

## 2016-07-12 DIAGNOSIS — Z1329 Encounter for screening for other suspected endocrine disorder: Secondary | ICD-10-CM

## 2016-07-12 DIAGNOSIS — E785 Hyperlipidemia, unspecified: Secondary | ICD-10-CM

## 2016-07-12 DIAGNOSIS — B20 Human immunodeficiency virus [HIV] disease: Secondary | ICD-10-CM

## 2016-07-12 DIAGNOSIS — K219 Gastro-esophageal reflux disease without esophagitis: Secondary | ICD-10-CM | POA: Insufficient documentation

## 2016-07-12 DIAGNOSIS — F419 Anxiety disorder, unspecified: Secondary | ICD-10-CM | POA: Insufficient documentation

## 2016-07-12 DIAGNOSIS — F329 Major depressive disorder, single episode, unspecified: Secondary | ICD-10-CM | POA: Insufficient documentation

## 2016-07-12 DIAGNOSIS — H538 Other visual disturbances: Secondary | ICD-10-CM | POA: Insufficient documentation

## 2016-07-12 DIAGNOSIS — Z131 Encounter for screening for diabetes mellitus: Secondary | ICD-10-CM

## 2016-07-12 DIAGNOSIS — M1990 Unspecified osteoarthritis, unspecified site: Secondary | ICD-10-CM | POA: Insufficient documentation

## 2016-07-12 DIAGNOSIS — Z125 Encounter for screening for malignant neoplasm of prostate: Secondary | ICD-10-CM

## 2016-07-12 DIAGNOSIS — F17200 Nicotine dependence, unspecified, uncomplicated: Secondary | ICD-10-CM | POA: Insufficient documentation

## 2016-07-12 DIAGNOSIS — Z79899 Other long term (current) drug therapy: Secondary | ICD-10-CM | POA: Insufficient documentation

## 2016-07-12 LAB — TSH: TSH: 0.97 mIU/L (ref 0.40–4.50)

## 2016-07-12 MED ORDER — PRAVASTATIN SODIUM 20 MG PO TABS: 20.0000 mg | ORAL_TABLET | Freq: Every day | ORAL | 3 refills | Status: DC

## 2016-07-12 MED ORDER — FAMOTIDINE 20 MG PO TABS: 20.0000 mg | ORAL_TABLET | Freq: Two times a day (BID) | ORAL | 3 refills | Status: DC

## 2016-07-12 NOTE — Progress Notes (Signed)
Shawn Brooks, is a 41 y.o. male  [id number]  [id number]  DOB - May 12, 1976  Chief Complaint  Patient presents with  . Blurred Vision        Subjective:   Shawn Brooks is a 41 y.o. male here today for a follow up visit, w. Hiv and gerd. Doing well, no c/o, currently unemployed and looking for gainful employment.  Still smoking, denies etoh.  Not exercising much do to his back pains. Has been on ppis for a long time for gerd. Likes ketchup/tomatoe sauce as well.   Patient has No headache, No chest pain, No abdominal pain - No Nausea, No new weakness tingling or numbness, No Cough - SOB.  No problems updated.  ALLERGIES: No Known Allergies  PAST MEDICAL HISTORY: Past Medical History:  Diagnosis Date  . Anxiety   . Blood dyscrasia    HIV  . Chronic back pain   . Depression   . DJD (degenerative joint disease)   . Headache(784.0)   . HIV infection ([place])     MEDICATIONS AT HOME: Prior to Admission medications   Medication Sig Start Date End Date Taking? Authorizing Provider  cyclobenzaprine (FLEXERIL) 10 MG tablet Take 1 tablet (10 mg total) by mouth 2 (two) times daily as needed for muscle spasms. 06/09/16   Doristine Devoid, PA-C  famotidine (PEPCID) 20 MG tablet Take 1 tablet (20 mg total) by mouth 2 (two) times daily. 07/12/16 07/12/17  Maren Reamer, MD  gabapentin (NEURONTIN) 300 MG capsule Take 300 mg by mouth 3 (three) times daily. 03/24/16   Historical Provider, MD  GENVOYA 150-150-200-10 MG TABS tablet TAKE 1 TABLET BY MOUTH EVERY DAY WITH PREZISTA 04/15/16   Thayer Headings, MD  LORazepam (ATIVAN) 0.5 MG tablet Take 1 tablet (0.5 mg total) by mouth every 8 (eight) hours as needed for anxiety (for anxiety/panic attack). Patient not taking: Reported on 05/31/2016 11/12/15   Delsa Grana, PA-C  omeprazole (PRILOSEC) 20 MG capsule TAKE 1 CAPSULE BY MOUTH DAILY 05/13/16   Thayer Headings, MD  pravastatin (PRAVACHOL) 20 MG tablet Take 1 tablet (20 mg total) by  mouth daily. 07/12/16   Maren Reamer, MD  PREZISTA 800 MG tablet TAKE 1 TABLET BY MOUTH EVERY DAY 04/15/16   Thayer Headings, MD  traZODone (DESYREL) 100 MG tablet Take 300 mg by mouth at bedtime.  07/24/14   Historical Provider, MD  valACYclovir (VALTREX) 500 MG tablet TAKE 1 TABLET BY MOUTH TWICE DAILY AS NEEDED FOR FLARE UPS Patient taking differently: TAKE 500 MG BY MOUTH TWICE DAILY AS NEEDED FOR FLARE UPS 01/02/16   Thayer Headings, MD     Objective:   Vitals:   07/12/16 1633  BP: 117/77  Pulse: 73  Resp: 16  Temp: 98.1 F (36.7 C)  TempSrc: Oral  SpO2: 96%  Weight: 253 lb 9.6 oz (115 kg)    Exam General appearance : Awake, alert, not in any distress. Speech Clear. Not toxic looking, pleasant HEENT: Atraumatic and Normocephalic, pupils equally reactive to light. Neck: supple, no JVD.  Chest:Good air entry bilaterally, no added sounds. CVS: S1 S2 regular, no murmurs/gallups or rubs. Abdomen: Bowel sounds active, obese, Non tender and not distended with no gaurding, rigidity or rebound. Extremities: B/L Lower Ext shows no edema, both legs are warm to touch Neurology: Awake alert, and oriented X 3, CN II-XII grossly intact, Non focal Skin:No Rash  Data Review Lab Results  Component Value Date   HGBA1C  5.7 (H) 11/16/2013    Depression screen [facility] 2 2/9 07/12/2016 05/31/2016 02/16/2016 11/18/2015 10/13/2015  Decreased Interest 1 0 1 1 0  Down, Depressed, Hopeless 1 0 1 2 0  PHQ - 2 Score 2 0 2 3 0  Altered sleeping 1 - 1 1 -  Tired, decreased energy 1 - 1 1 -  Change in appetite 0 - 1 1 -  Feeling bad or failure about yourself  1 - 1 1 -  Trouble concentrating 1 - 1 0 -  Moving slowly or fidgety/restless 0 - 0 0 -  Suicidal thoughts 0 - 0 0 -  PHQ-9 Score 6 - 7 7 -  Difficult doing work/chores - - - Somewhat difficult -  Some recent data might be hidden      Assessment & Plan   1. Hyperlipidemia, unspecified hyperlipidemia type Labs noted 11/17 - low fat diet  discussed, recd increase exercise/activity - pravastatin 20 qhs started - BASIC METABOLIC PANEL WITH GFR  2. hiv - being followed by ID, Dr Linus Salmons  3. gerd gerd diet discussed, recd getting off ppi if able, longterm ppi used correlated w/ ckd, pt amendiable to trying pepcic bid and weaning off ppi in 1-2 wks, gerd diet discussed as well,  4. tob + Smoking cessation recd  5. Thyroid disorder screen - TSH    6. Prostate cancer screening - PSA  7. Diabetes mellitus screening - HgB A1c    Patient have been counseled extensively about nutrition and exercise  Return in about 3 months (around 10/10/2016), or if symptoms worsen or fail to improve.  The patient was given clear instructions to go to ER or return to medical center if symptoms don't improve, worsen or new problems develop. The patient verbalized understanding. The patient was told to call to get lab results if they haven't heard anything in the next week.   This note has been created with Surveyor, quantity. Any transcriptional errors are unintentional.   Maren Reamer, MD, [place] [facility] [place], [place]   07/12/2016, 4:52 PM

## 2016-07-12 NOTE — Patient Instructions (Addendum)
Cholesterol Cholesterol is a fat. Your body needs a small amount of cholesterol. Cholesterol (plaque) may build up in your blood vessels (arteries). That makes you more likely to have a heart attack or stroke. You cannot feel your cholesterol level. Having a blood test is the only way to find out if your level is high. Keep your test results. Work with your doctor to keep your cholesterol at a good level. What do the results mean?  Total cholesterol is how much cholesterol is in your blood.  LDL is bad cholesterol. This is the type that can build up. Try to have low LDL.  HDL is good cholesterol. It cleans your blood vessels and carries LDL away. Try to have high HDL.  Triglycerides are fat that the body can store or burn for energy. What are good levels of cholesterol?  Total cholesterol below 200.  LDL below 100 is good for people who have health risks. LDL below 70 is good for people who have very high risks.  HDL above 40 is good. It is best to have HDL of 60 or higher.  Triglycerides below 150. How can I lower my cholesterol? Diet  Follow your diet program as told by your doctor.  Choose fish, white meat chicken, or [place] that is roasted or baked. Try not to eat red meat, fried foods, sausage, or lunch meats.  Eat lots of fresh fruits and vegetables.  Choose whole grains, beans, pasta, potatoes, and cereals.  Choose olive oil, corn oil, or canola oil. Only use small amounts.  Try not to eat butter, mayonnaise, shortening, or palm kernel oils.  Try not to eat foods with trans fats.  Choose low-fat or nonfat dairy foods.  Drink skim or nonfat milk.  Eat low-fat or nonfat yogurt and cheeses.  Try not to drink whole milk or cream.  Try not to eat ice cream, egg yolks, or full-fat cheeses.  Healthy desserts include angel food cake, ginger snaps, animal crackers, hard candy, popsicles, and low-fat or nonfat frozen yogurt. Try not to eat pastries, cakes, pies, and  cookies. Exercise  Follow your exercise program as told by your doctor.  Be more active. Try gardening, walking, and taking the stairs.  Ask your doctor about ways that you can be more active. Medicine  Take over-the-counter and prescription medicines only as told by your doctor. This information is not intended to replace advice given to you by your health care provider. Make sure you discuss any questions you have with your health care provider. Document Released: 09/10/2008 Document Revised: 01/14/2016 Document Reviewed: 12/25/2015 Elsevier Interactive Patient Education  2017 [place] for Gastroesophageal Reflux Disease, Adult When you have gastroesophageal reflux disease (GERD), the foods you eat and your eating habits are very important. Choosing the right foods can help ease your discomfort. What guidelines do I need to follow?  Choose fruits, vegetables, whole grains, and low-fat dairy products.  Choose low-fat meat, fish, and poultry.  Limit fats such as oils, salad dressings, butter, nuts, and avocado.  Keep a food diary. This helps you identify foods that cause symptoms.  Avoid foods that cause symptoms. These may be different for everyone.  Eat small meals often instead of 3 large meals a day.  Eat your meals slowly, in a place where you are relaxed.  Limit fried foods.  Cook foods using methods other than frying.  Avoid drinking alcohol.  Avoid drinking large amounts of liquids with your  meals.  Avoid bending over or lying down until 2-3 hours after eating. What foods are not recommended? These are some foods and drinks that may make your symptoms worse: Vegetables  Tomatoes. Tomato juice. Tomato and spaghetti sauce. Chili peppers. Onion and garlic. Horseradish. Fruits  Oranges, grapefruit, and lemon (fruit and juice). Meats  High-fat meats, fish, and poultry. This includes hot dogs, ribs, ham, sausage, salami, and bacon. Dairy    Whole milk and chocolate milk. Sour cream. Cream. Butter. Ice cream. Cream cheese. Drinks  Coffee and tea. Bubbly (carbonated) drinks or energy drinks. Condiments  Hot sauce. Barbecue sauce. Sweets/Desserts  Chocolate and cocoa. Donuts. Peppermint and spearmint. Fats and Oils  High-fat foods. This includes [place] fries and potato chips. Other  Vinegar. Strong spices. This includes black pepper, white pepper, red pepper, cayenne, curry powder, cloves, ginger, and chili powder. The items listed above may not be a complete list of foods and drinks to avoid. Contact your dietitian for more information.  This information is not intended to replace advice given to you by your health care provider. Make sure you discuss any questions you have with your health care provider. Document Released: 12/14/2011 Document Revised: 11/20/2015 Document Reviewed: 04/18/2013 Elsevier Interactive Patient Education  2017 Elsevier Inc.   - Prediabetes // Diabetes Mellitus and Food It is important for you to manage your blood sugar (glucose) level. Your blood glucose level can be greatly affected by what you eat. Eating healthier foods in the appropriate amounts throughout the day at about the same time each day will help you control your blood glucose level. It can also help slow or prevent worsening of your diabetes mellitus. Healthy eating may even help you improve the level of your blood pressure and reach or maintain a healthy weight. General recommendations for healthful eating and cooking habits include:  Eating meals and snacks regularly. Avoid going long periods of time without eating to lose weight.  Eating a diet that consists mainly of plant-based foods, such as fruits, vegetables, nuts, legumes, and whole grains.  Using low-heat cooking methods, such as baking, instead of high-heat cooking methods, such as deep frying. Work with your dietitian to make sure you understand how to use the Nutrition  Facts information on food labels. How can food affect me? Carbohydrates  Carbohydrates affect your blood glucose level more than any other type of food. Your dietitian will help you determine how many carbohydrates to eat at each meal and teach you how to count carbohydrates. Counting carbohydrates is important to keep your blood glucose at a healthy level, especially if you are using insulin or taking certain medicines for diabetes mellitus. Alcohol  Alcohol can cause sudden decreases in blood glucose (hypoglycemia), especially if you use insulin or take certain medicines for diabetes mellitus. Hypoglycemia can be a life-threatening condition. Symptoms of hypoglycemia (sleepiness, dizziness, and disorientation) are similar to symptoms of having too much alcohol. If your health care provider has given you approval to drink alcohol, do so in moderation and use the following guidelines:  Women should not have more than one drink per day, and men should not have more than two drinks per day. One drink is equal to:  12 oz of beer.  5 oz of wine.  1 oz of hard liquor.  Do not drink on an empty stomach.  Keep yourself hydrated. Have water, diet soda, or unsweetened iced tea.  Regular soda, juice, and other mixers might contain a lot of carbohydrates and should  be counted. What foods are not recommended? As you make food choices, it is important to remember that all foods are not the same. Some foods have fewer nutrients per serving than other foods, even though they might have the same number of calories or carbohydrates. It is difficult to get your body what it needs when you eat foods with fewer nutrients. Examples of foods that you should avoid that are high in calories and carbohydrates but low in nutrients include:  Trans fats (most processed foods list trans fats on the Nutrition Facts label).  Regular soda.  Juice.  Candy.  Sweets, such as cake, pie, doughnuts, and cookies.  Fried  foods. What foods can I eat? Eat nutrient-rich foods, which will nourish your body and keep you healthy. The food you should eat also will depend on several factors, including:  The calories you need.  The medicines you take.  Your weight.  Your blood glucose level.  Your blood pressure level.  Your cholesterol level. You should eat a variety of foods, including:  Protein.  Lean cuts of meat.  Proteins low in saturated fats, such as fish, egg whites, and beans. Avoid processed meats.  Fruits and vegetables.  Fruits and vegetables that may help control blood glucose levels, such as apples, mangoes, and yams.  Dairy products.  Choose fat-free or low-fat dairy products, such as milk, yogurt, and cheese.  Grains, bread, pasta, and rice.  Choose whole grain products, such as multigrain bread, whole oats, and brown rice. These foods may help control blood pressure.  Fats.  Foods containing healthful fats, such as nuts, avocado, olive oil, canola oil, and fish. Does everyone with diabetes mellitus have the same meal plan? Because every person with diabetes mellitus is different, there is not one meal plan that works for everyone. It is very important that you meet with a dietitian who will help you create a meal plan that is just right for you. This information is not intended to replace advice given to you by your health care provider. Make sure you discuss any questions you have with your health care provider. Document Released: 03/11/2005 Document Revised: 11/20/2015 Document Reviewed: 05/11/2013 Elsevier Interactive Patient Education  2017 [employer].

## 2016-07-13 LAB — BASIC METABOLIC PANEL WITH GFR
BUN: 14 mg/dL (ref 7–25)
CO2: 26 mmol/L (ref 20–31)
Calcium: 9.4 mg/dL (ref 8.6–10.3)
Chloride: 107 mmol/L (ref 98–110)
Creat: 1.36 mg/dL — ABNORMAL HIGH (ref 0.60–1.35)
GFR, EST NON AFRICAN AMERICAN: 63 mL/min (ref >=60)
GFR, Est African American: 73 mL/min (ref >=60)
GLUCOSE: 82 mg/dL (ref 65–99)
POTASSIUM: 4.2 mmol/L (ref 3.5–5.3)
Sodium: 139 mmol/L (ref 135–146)

## 2016-07-13 LAB — PSA: PSA: 1 ng/mL (ref <=4.0)

## 2016-08-26 ENCOUNTER — Ambulatory Visit: Payer: Self-pay

## 2016-09-02 ENCOUNTER — Other Ambulatory Visit: Payer: Self-pay | Admitting: Internal Medicine

## 2016-09-08 ENCOUNTER — Ambulatory Visit: Payer: Self-pay | Admitting: Internal Medicine

## 2016-09-13 ENCOUNTER — Encounter: Payer: Self-pay | Admitting: Internal Medicine

## 2016-09-13 ENCOUNTER — Telehealth: Payer: Self-pay | Admitting: Internal Medicine

## 2016-09-13 ENCOUNTER — Ambulatory Visit: Payer: Self-pay | Attending: Internal Medicine | Admitting: Internal Medicine

## 2016-09-13 ENCOUNTER — Ambulatory Visit (HOSPITAL_COMMUNITY)
Admission: RE | Admit: 2016-09-13 | Discharge: 2016-09-13 | Disposition: A | Discharge status: Home / Self Care | Payer: Self-pay | Source: Ambulatory Visit | Attending: Internal Medicine | Admitting: Internal Medicine

## 2016-09-13 ENCOUNTER — Encounter (HOSPITAL_COMMUNITY): Payer: Self-pay

## 2016-09-13 VITALS — BP 119/76 | HR 69 | Temp 98.5°F | Resp 16 | Wt 251.6 lb

## 2016-09-13 DIAGNOSIS — K625 Hemorrhage of anus and rectum: Secondary | ICD-10-CM

## 2016-09-13 DIAGNOSIS — K219 Gastro-esophageal reflux disease without esophagitis: Secondary | ICD-10-CM

## 2016-09-13 DIAGNOSIS — R51 Headache: Secondary | ICD-10-CM | POA: Insufficient documentation

## 2016-09-13 DIAGNOSIS — F17200 Nicotine dependence, unspecified, uncomplicated: Secondary | ICD-10-CM | POA: Insufficient documentation

## 2016-09-13 DIAGNOSIS — Z79899 Other long term (current) drug therapy: Secondary | ICD-10-CM | POA: Insufficient documentation

## 2016-09-13 DIAGNOSIS — M1990 Unspecified osteoarthritis, unspecified site: Secondary | ICD-10-CM | POA: Insufficient documentation

## 2016-09-13 DIAGNOSIS — F329 Major depressive disorder, single episode, unspecified: Secondary | ICD-10-CM | POA: Insufficient documentation

## 2016-09-13 DIAGNOSIS — F419 Anxiety disorder, unspecified: Secondary | ICD-10-CM | POA: Insufficient documentation

## 2016-09-13 DIAGNOSIS — K279 Peptic ulcer, site unspecified, unspecified as acute or chronic, without hemorrhage or perforation: Secondary | ICD-10-CM

## 2016-09-13 DIAGNOSIS — G8929 Other chronic pain: Secondary | ICD-10-CM | POA: Insufficient documentation

## 2016-09-13 DIAGNOSIS — B20 Human immunodeficiency virus [HIV] disease: Secondary | ICD-10-CM | POA: Insufficient documentation

## 2016-09-13 HISTORY — DX: Essential (primary) hypertension: I10

## 2016-09-13 LAB — CBC WITH DIFFERENTIAL/PLATELET
BASOS ABS: 0 {cells}/uL (ref 0–200)
BASOS PCT: 0 %
Eosinophils Absolute: 65 cells/uL (ref 15–500)
Eosinophils Relative: 1 %
HCT: 46.3 % (ref 38.5–50.0)
Hemoglobin: 15.9 g/dL (ref 13.2–17.1)
LYMPHS PCT: 36 %
Lymphs Abs: 2340 cells/uL (ref 850–3900)
MCH: 31.1 pg (ref 27.0–33.0)
MCHC: 34.3 g/dL (ref 32.0–36.0)
MCV: 90.6 fL (ref 80.0–100.0)
MONOS PCT: 7 %
MPV: 10 fL (ref 7.5–12.5)
Monocytes Absolute: 455 cells/uL (ref 200–950)
NEUTROS ABS: 3640 {cells}/uL (ref 1500–7800)
Neutrophils Relative %: 56 %
Platelets: 207 10*3/uL (ref 140–400)
RBC: 5.11 MIL/uL (ref 4.20–5.80)
RDW: 14.1 % (ref 11.0–15.0)
WBC: 6.5 10*3/uL (ref 3.8–10.8)

## 2016-09-13 LAB — POCT I-STAT CREATININE: CREATININE: 1.5 mg/dL — AB (ref 0.61–1.24)

## 2016-09-13 LAB — HEMOCCULT GUIAC POC 1CARD (OFFICE): Fecal Occult Blood, POC: POSITIVE — AB

## 2016-09-13 MED ORDER — TRAMADOL HCL 50 MG PO TABS: 50.0000 mg | ORAL_TABLET | Freq: Four times a day (QID) | ORAL | 0 refills | Status: DC | PRN

## 2016-09-13 MED ORDER — IOPAMIDOL (ISOVUE-300) INJECTION 61%
INTRAVENOUS | Status: AC
  Filled 2016-09-13: qty 100

## 2016-09-13 MED ORDER — RANITIDINE HCL 150 MG PO TABS: 150.0000 mg | ORAL_TABLET | Freq: Two times a day (BID) | ORAL | 3 refills | Status: DC

## 2016-09-13 MED ORDER — PANTOPRAZOLE SODIUM 40 MG PO TBEC: 40.0000 mg | DELAYED_RELEASE_TABLET | Freq: Every day | ORAL | 3 refills | Status: DC

## 2016-09-13 MED ORDER — IOPAMIDOL (ISOVUE-300) INJECTION 61%
100.0000 mL | Freq: Once | INTRAVENOUS | Status: AC | PRN
  Administered 2016-09-13: 100 mL via INTRAVENOUS

## 2016-09-13 MED ORDER — SUCRALFATE 1 GM/10ML PO SUSP: 1.0000 g | Freq: Three times a day (TID) | ORAL | 0 refills | Status: DC

## 2016-09-13 MED ORDER — FAMOTIDINE 20 MG PO TABS: 20.0000 mg | ORAL_TABLET | Freq: Two times a day (BID) | ORAL | 3 refills | Status: DC

## 2016-09-13 MED ORDER — IOPAMIDOL (ISOVUE-300) INJECTION 61%
30.0000 mL | Freq: Once | INTRAVENOUS | Status: AC | PRN
  Administered 2016-09-13: 30 mL via ORAL

## 2016-09-13 MED ORDER — SUCRALFATE 1 G PO TABS: 1.0000 g | ORAL_TABLET | Freq: Three times a day (TID) | ORAL | 0 refills | Status: DC

## 2016-09-13 MED ORDER — IOPAMIDOL (ISOVUE-300) INJECTION 61%
INTRAVENOUS | Status: AC
  Filled 2016-09-13: qty 30

## 2016-09-13 NOTE — Telephone Encounter (Signed)
Patient called the office to speak with PCP regarding his medication that was prescribed this morning, sucralfate (CARAFATE) 1 GM/10ML suspension. Patient is concerned about the cost. Might need other options. Please follow up.  Thank you.

## 2016-09-13 NOTE — Telephone Encounter (Signed)
Will forward to pcp

## 2016-09-13 NOTE — Patient Instructions (Signed)
Avoid NSAIDS/ aleve, motrin, aspirin, ibuprofen, etc. - may worsen peptic ulcer  -   Bland Diet A bland diet consists of foods that do not have a lot of fat or fiber. Foods without fat or fiber are easier for the body to digest. They are also less likely to irritate your mouth, throat, stomach, and other parts of your gastrointestinal tract. A bland diet is sometimes called a BRAT diet. What is my plan? Your health care provider or dietitian may recommend specific changes to your diet to prevent and treat your symptoms, such as:  Eating small meals often.  Cooking food until it is soft enough to chew easily.  Chewing your food well.  Drinking fluids slowly.  Not eating foods that are very spicy, sour, or fatty.  Not eating citrus fruits, such as oranges and grapefruit. What do I need to know about this diet?  Eat a variety of foods from the bland diet food list.  Do not follow a bland diet longer than you have to.  Ask your health care provider whether you should take vitamins. What foods can I eat? Grains   Hot cereals, such as cream of wheat. Bread, crackers, or tortillas made from refined white flour. Rice. Vegetables  Canned or cooked vegetables. Mashed or boiled potatoes. Fruits  Bananas. Applesauce. Other types of cooked or canned fruit with the skin and seeds removed, such as canned peaches or pears. Meats and Other Protein Sources  Scrambled eggs. Creamy peanut butter or other nut butters. Lean, well-cooked meats, such as chicken or fish. Tofu. Soups or broths. Dairy  Low-fat dairy products, such as milk, cottage cheese, or yogurt. Beverages  Water. Herbal tea. Apple juice. Sweets and Desserts  Pudding. Custard. Fruit gelatin. Ice cream. Fats and Oils  Mild salad dressings. Canola or olive oil. The items listed above may not be a complete list of allowed foods or beverages. Contact your dietitian for more options.  What foods are not recommended? Foods and  ingredients that are often not recommended include:  Spicy foods, such as hot sauce or salsa.  Fried foods.  Sour foods, such as pickled or fermented foods.  Raw vegetables or fruits, especially citrus or berries.  Caffeinated drinks.  Alcohol.  Strongly flavored seasonings or condiments. The items listed above may not be a complete list of foods and beverages that are not allowed. Contact your dietitian for more information.  This information is not intended to replace advice given to you by your health care provider. Make sure you discuss any questions you have with your health care provider. Document Released: 10/06/2015 Document Revised: 11/20/2015 Document Reviewed: 06/26/2014 Elsevier Interactive Patient Education  2017 [place] Gastrointestinal Bleeding Lower gastrointestinal (GI) bleeding is the result of bleeding from the colon, rectum, or anal area. The colon is the last part of the digestive tract, where stool, also called feces, is formed. If you have lower GI bleeding, you may see blood in or on your stool. It may be bright red. Lower GI bleeding often stops without treatment. Continued or heavy bleeding needs emergency treatment at the hospital. What are the causes? Lower GI bleeding may be caused by:  A condition that causes pouches to form in the colon over time (diverticulosis).  Swelling and irritation (inflammation) in areas with diverticulosis (diverticulitis).  Inflammation of the colon (inflammatory bowel disease).  Swollen veins in the rectum (hemorrhoids).  Painful tears in the anus (anal fissures), often caused by passing hard  stools.  Cancer of the colon or rectum.  Noncancerous growths (polyps) of the colon or rectum.  A bleeding disorder that impairs the formation of blood clots and causes easy bleeding (coagulopathy).  An abnormal weakening of a blood vessel where an artery and a vein come together (arteriovenous  malformation). What increases the risk? You are more likely to develop this condition if:  You are older than 43 years of age.  You take aspirin or NSAIDs on a regular basis.  You take anticoagulant or antiplatelet drugs.  You have a history of high-dose X-ray treatment (radiation therapy) of the colon.  You recently had a colon polyp removed. What are the signs or symptoms? Symptoms of this condition include:  Bright red blood or blood clots coming from your rectum.  Bloody stools.  Black or maroon-colored stools.  Pain or cramping in the abdomen.  Weakness or dizziness.  Racing heartbeat. How is this diagnosed? This condition may be diagnosed based on:  Your symptoms and medical history.  A physical exam. During the exam, your health care provider will check for signs of blood loss, such as low blood pressure and a rapid pulse.  Tests, such as:  Flexible sigmoidoscopy. In this procedure, a flexible tube with a camera on the end is used to examine your anus and the first part of your colon to look for the source of bleeding.  Colonoscopy. This is similar to a flexible sigmoidoscopy, but the camera can extend all the way to the uppermost part of your colon.  Blood tests to measure your red blood cell count and to check for coagulopathy.  An imaging study of your colon to look for a bleeding site. In some cases, you may have X-rays taken after a dye or radioactive substance is injected into your bloodstream (angiogram). How is this treated? Treatment for this condition depends on the cause of the bleeding. Heavy or persistent bleeding is treated at the hospital. Treatment may include:  Getting fluids through an IV tube inserted into one of your veins.  Getting blood through an IV tube (blood transfusion).  Stopping bleeding through high-heat coagulation, injections of certain medicines, or applying surgical clips. This can all be done during a colonoscopy.  Having a  procedure that involves first doing an angiogram and then blocking blood flow to the bleeding site (embolization).  Stopping some of your regular medicines for a certain amount of time.  Having surgery to remove part of the colon. This may be needed if bleeding is severe and does not respond to other treatment. Follow these instructions at home:  Take over-the-counter and prescription medicines only as told by your health care provider. You may need to avoid aspirin, NSAIDs, or other medicines that increase bleeding.  Eat foods that are high in fiber. This will help keep your stools soft. These foods include whole grains, legumes, fruits, and vegetables. Eating 1-3 prunes each day works well for many people.  Drink enough fluid to keep your urine clear or pale yellow.  Keep all follow-up visits as told by your health care provider. This is important. Contact a health care provider if:  Your symptoms do not improve. Get help right away if:  Your bleeding increases.  You feel light-headed or you faint.  You feel weak.  You have severe cramps in your back or abdomen.  You pass large blood clots in your stool.  Your symptoms get worse. This information is not intended to replace advice given to  you by your health care provider. Make sure you discuss any questions you have with your health care provider. Document Released: 10/30/2015 Document Revised: 11/20/2015 Document Reviewed: 10/30/2015 Elsevier Interactive Patient Education  2017 [employer].

## 2016-09-13 NOTE — Telephone Encounter (Signed)
I put in rx for pill form of carafate to see if cost difference. Crush pill if able and mix with about 79ml water prior to meals and bedtime. thanks

## 2016-09-13 NOTE — Progress Notes (Signed)
Shawn Brooks, is a 41 y.o. male  ZDG:[id number]  [id number]  DOB - 02-26-1976  Chief Complaint  Patient presents with  . Heartburn  . Rectal Bleeding        Subjective:   Shawn Brooks is a 41 y.o. male here today for a follow up visit.  Last seen in clinic1/15, states since changing ppi to pepcid, seems to have worse gerd. Had to take intermittent tums as well. He is watching his diet much more, and decrease drinking to 1 - 16oz qod now. Of note, he has had some headaches recently, and had been taking more Aleve lately.  About 5 days ago, had severe abd pains, diffuse, and ended up w/ brbpr.  Now stool appears normal caliber, brown consistency. Has bilat upper quad abd pains, described as dull ache w/ intermittent sharp spasms. Food does not appear to make it better or worse.  Still smoking tob, but not as much as prior.  Patient has No headache, No chest pain,  No Nausea, No new weakness tingling or numbness, No Cough - SOB.  No problems updated.  ALLERGIES: No Known Allergies  PAST MEDICAL HISTORY: Past Medical History:  Diagnosis Date  . Anxiety   . Blood dyscrasia    HIV  . Chronic back pain   . Depression   . DJD (degenerative joint disease)   . Headache(784.0)   . HIV infection ([place])     MEDICATIONS AT HOME: Prior to Admission medications   Medication Sig Start Date End Date Taking? Authorizing Provider  cyclobenzaprine (FLEXERIL) 10 MG tablet Take 1 tablet (10 mg total) by mouth 2 (two) times daily as needed for muscle spasms. Patient not taking: Reported on 09/13/2016 06/09/16   Doristine Devoid, PA-C  gabapentin (NEURONTIN) 300 MG capsule Take 300 mg by mouth 3 (three) times daily. 03/24/16   Historical Provider, MD  GENVOYA 150-150-200-10 MG TABS tablet TAKE 1 TABLET BY MOUTH EVERY DAY WITH PREZISTA 09/03/16   Thayer Headings, MD  LORazepam (ATIVAN) 0.5 MG tablet Take 1 tablet (0.5 mg total) by mouth every 8 (eight) hours as needed for anxiety (for  anxiety/panic attack). Patient not taking: Reported on 05/31/2016 11/12/15   Delsa Grana, PA-C  omeprazole (PRILOSEC) 20 MG capsule TAKE 1 CAPSULE BY MOUTH DAILY Patient not taking: Reported on 09/13/2016 05/13/16   Thayer Headings, MD  pantoprazole (PROTONIX) 40 MG tablet Take 1 tablet (40 mg total) by mouth daily. 09/13/16   Maren Reamer, MD  pravastatin (PRAVACHOL) 20 MG tablet Take 1 tablet (20 mg total) by mouth daily. 07/12/16   Maren Reamer, MD  PREZISTA 800 MG tablet TAKE 1 TABLET BY MOUTH EVERY DAY 09/03/16   Thayer Headings, MD  sucralfate (CARAFATE) 1 GM/10ML suspension Take 10 mLs (1 g total) by mouth 4 (four) times daily -  with meals and at bedtime. 09/13/16   Maren Reamer, MD  traMADol (ULTRAM) 50 MG tablet Take 1 tablet (50 mg total) by mouth every 6 (six) hours as needed for severe pain. 09/13/16   Maren Reamer, MD  traZODone (DESYREL) 100 MG tablet Take 300 mg by mouth at bedtime.  07/24/14   Historical Provider, MD  valACYclovir (VALTREX) 500 MG tablet TAKE 1 TABLET BY MOUTH TWICE DAILY AS NEEDED FOR FLARE UPS Patient taking differently: TAKE 500 MG BY MOUTH TWICE DAILY AS NEEDED FOR FLARE UPS 01/02/16   Thayer Headings, MD     Objective:   Vitals:  09/13/16 0904  BP: 119/76  Pulse: 69  Resp: 16  Temp: 98.5 F (36.9 C)  TempSrc: Oral  SpO2: 97%  Weight: 251 lb 9.6 oz (114.1 kg)    Exam General appearance : Awake, alert, not in any distress. Speech Clear. Not toxic looking. Appears uncomfortable. HEENT: Atraumatic and Normocephalic, pupils equally reactive to light. Neck: supple, no JVD.  Chest:Good air entry bilaterally, no added sounds. CVS: S1 S2 regular, no murmurs/gallups or rubs. Abdomen: Bowel sounds active, ttp bilat upper quadrants, but left >right, no Murphy's sign.  Not distended with no gaurding, rigidity or rebound. Extremities: B/L Lower Ext shows no edema, both legs are warm to touch Rectal exam; prostate nttp, normal caliber, no nodules,  minimal stool in vault. Good rectal tone.  No brbpr. Neurology: Awake alert, and oriented X 3, CN II-XII grossly intact, Non focal Skin:No Rash   FOBT +  Data Review Lab Results  Component Value Date   HGBA1C 5.7 (H) 11/16/2013    Depression screen PHQ 2/9 09/13/2016 07/12/2016 05/31/2016 02/16/2016 11/18/2015  Decreased Interest 1 1 0 1 1  Down, Depressed, Hopeless 1 1 0 1 2  PHQ - 2 Score 2 2 0 2 3  Altered sleeping 0 1 - 1 1  Tired, decreased energy 1 1 - 1 1  Change in appetite 0 0 - 1 1  Feeling bad or failure about yourself  1 1 - 1 1  Trouble concentrating 1 1 - 1 0  Moving slowly or fidgety/restless 1 0 - 0 0  Suicidal thoughts 0 0 - 0 0  PHQ-9 Score 6 6 - 7 7  Difficult doing work/chores - - - - Somewhat difficult  Some recent data might be hidden      Assessment & Plan   1. Gastroesophageal reflux disease without esophagitis, w/ concern for pud - carafate suspension qid - restart protonix 40 qd for now - bland diet discussed, avoid NSAIDs. - CT Abdomen Pelvis W Contrast; Future  2. Rectal bleeding See #1 - Ambulatory referral to Gastroenterology - CBC with Differential - BASIC METABOLIC PANEL WITH GFR - Hemoccult - 1 Card (office) - Lipase - CT Abdomen Pelvis W Contrast; Future  3. PUD (peptic ulcer disease) See #1  4. hiv - defer to Dr Linus Salmons.  5. Headaches Avoid nsaids. Short rx for ultram 50 mg tabs, #15, prn for pain.   Patient have been counseled extensively about nutrition and exercise  Return in about 1 week (around 09/20/2016) for add on.  The patient was given clear instructions to go to ER or return to medical center if symptoms don't improve, worsen or new problems develop. The patient verbalized understanding. The patient was told to call to get lab results if they haven't heard anything in the next week.   This note has been created with Surveyor, quantity. Any transcriptional errors are  unintentional.   Maren Reamer, MD, [facility] [place], [place]   09/13/2016, 9:40 AM

## 2016-09-13 NOTE — Addendum Note (Signed)
Addended byLottie Mussel T on: 09/13/2016 11:52 AM   Modules accepted: Orders

## 2016-09-13 NOTE — Telephone Encounter (Signed)
Called pt, confirmed dob.  Ct scan was neg for acute process. May still have pud. Avoid nsaids.  carafate rx changed to tabs, can crush and mix with water to help coat lining of stomach.

## 2016-09-14 LAB — LIPASE: Lipase: 24 U/L (ref 7–60)

## 2016-09-14 LAB — BASIC METABOLIC PANEL WITH GFR
BUN: 14 mg/dL (ref 7–25)
CO2: 21 mmol/L (ref 20–31)
CREATININE: 1.43 mg/dL — AB (ref 0.60–1.35)
Calcium: 9.6 mg/dL (ref 8.6–10.3)
Chloride: 106 mmol/L (ref 98–110)
GFR, EST AFRICAN AMERICAN: 69 mL/min (ref >=60)
GFR, Est Non African American: 60 mL/min (ref >=60)
Glucose, Bld: 95 mg/dL (ref 65–99)
Potassium: 4.4 mmol/L (ref 3.5–5.3)
Sodium: 139 mmol/L (ref 135–146)

## 2016-09-15 ENCOUNTER — Ambulatory Visit (INDEPENDENT_AMBULATORY_CARE_PROVIDER_SITE_OTHER): Payer: Self-pay | Admitting: Internal Medicine

## 2016-09-15 ENCOUNTER — Encounter: Payer: Self-pay | Admitting: *Deleted

## 2016-09-15 VITALS — BP 110/70 | HR 70 | Resp 16 | Ht 68.0 in | Wt 247.0 lb

## 2016-09-15 DIAGNOSIS — R1013 Epigastric pain: Secondary | ICD-10-CM

## 2016-09-15 DIAGNOSIS — K625 Hemorrhage of anus and rectum: Secondary | ICD-10-CM

## 2016-09-15 DIAGNOSIS — Z8619 Personal history of other infectious and parasitic diseases: Secondary | ICD-10-CM

## 2016-09-15 NOTE — Progress Notes (Signed)
Subjective:    Patient ID: Shawn Brooks, male    DOB: [birth year], 41 y.o.   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 Johney Maine, history of diverticulosis and internal hemorrhoids, GERD and dyspepsia who is here for follow-up. He is alone today. He has not been seen in our office in several years.  He reports that over the last 2 months he has noticed red blood in his stools on a much more frequent basis and over the last 2 weeks red blood with every bowel movement. He even has darker blood that is more coagulated on the toilet tissue. His bowel movements have been regular and for the most part solid and not hard. He reports that the bleeding per rectum is painless. He hasn't had anal or rectal pain. His condylomas were treated by Dr. Johney Maine several years ago and he has not had recent follow-up.  Separate from this is some upper abdominal pain in the epigastrium both right and left. This is described as a sharp pain but also has a rumbling pain. He was taken off of PPI and placed on Pepcid 20 mg twice daily but this has not been controlling his heartburn or dyspepsia as well. He had a CT scan 2 days ago for this upper abdominal pain and I have reviewed this. It was largely unrevealing. Chronic care restarted PPI with pantoprazole 40 mg twice a day and he is also continue the Pepcid. Has nausea, vomiting, dysphagia and early satiety. He has lost about 10 pounds over the last 2-1/2 months which he says was intentional. He has reduced fried foods and is eating smaller meal portions after being diagnosed with hypercholesterolemia and prediabetes.  Review of Systems As per history of present illness, otherwise negative  Current Medications, Allergies, Past Medical History, Past Surgical History, Family History and Social History were reviewed in [employer]  record.     Objective:   Physical Exam BP 110/70   Pulse 70   Resp 16   Ht 5\' 8"  (1.727 m)   Wt 247 lb (112 kg)   BMI 37.56 kg/m  Constitutional: Well-developed and well-nourished. No distress. HEENT: Normocephalic and atraumatic. Oropharynx is clear and moist. No oropharyngeal exudate. Conjunctivae are normal.  No scleral icterus. Neck: Neck supple. Trachea midline. Cardiovascular: Normal rate, regular rhythm and intact distal pulses. No M/R/G Pulmonary/chest: Effort normal and breath sounds normal. No wheezing, rales or rhonchi. Abdominal: Soft, nontender, nondistended. Bowel sounds active throughout.   Extremities: no clubbing, cyanosis, or edema. Neurological: Alert and oriented to person place and time. Skin: Skin is warm and dry.  Psychiatric: Normal mood and affect. Behavior is normal.  CBC    Component Value Date/Time   WBC 6.5 09/13/2016 0936   RBC 5.11 09/13/2016 0936   HGB 15.9 09/13/2016 0936   HCT 46.3 09/13/2016 0936   PLT 207 09/13/2016 0936   MCV 90.6 09/13/2016 0936   MCH 31.1 09/13/2016 0936   MCHC 34.3 09/13/2016 0936   RDW 14.1 09/13/2016 0936   LYMPHSABS 2,340 09/13/2016 0936   MONOABS 455 09/13/2016 0936   EOSABS 65 09/13/2016 0936   BASOSABS 0 09/13/2016 0936   CMP     Component Value Date/Time   NA 139 09/13/2016 0936   K 4.4 09/13/2016 0936   CL 106 09/13/2016 0936   CO2 21 09/13/2016 0936   GLUCOSE  95 09/13/2016 0936   BUN 14 09/13/2016 0936   CREATININE 1.50 (H) 09/13/2016 1450   CREATININE 1.43 (H) 09/13/2016 0936   CALCIUM 9.6 09/13/2016 0936   PROT 7.4 08/25/2015 1155   ALBUMIN 4.1 08/25/2015 1155   AST 21 08/25/2015 1155   ALT 19 08/25/2015 1155   ALKPHOS 93 08/25/2015 1155   BILITOT 0.3 08/25/2015 1155   GFRNONAA 60 09/13/2016 0936   GFRAA 69 09/13/2016 0936   CLINICAL DATA:  Right upper quadrant pain, weight loss, constipation, positive HIV, gastroesophageal reflux disease   EXAM: CT ABDOMEN AND PELVIS WITH CONTRAST     TECHNIQUE: Multidetector CT imaging of the abdomen and pelvis was performed using the standard protocol following bolus administration of intravenous contrast.   CONTRAST:  135mL ISOVUE-300 IOPAMIDOL (ISOVUE-300) INJECTION 61%   COMPARISON:  CT scan 07/31/2007   FINDINGS: Lower chest: Lung bases shows no acute findings.   Hepatobiliary: Enhanced liver shows no focal abnormality. No calcified gallstones are noted within gallbladder.   Pancreas: Enhanced pancreas without focal abnormality. No peripancreatic inflammatory changes.   Spleen: Enhanced spleen with normal appearance.   Adrenals/Urinary Tract: No adrenal gland mass. Enhanced kidneys are symmetrical in size. No hydronephrosis or hydroureter. Delayed renal images shows bilateral renal symmetrical excretion. Bilateral visualized proximal ureter is unremarkable. The urinary bladder is unremarkable. Bilateral distal ureter is   Stomach/Bowel: No small bowel obstruction. Oral contrast material was given to the patient. No thickened or dilated small bowel loops. Contrast material noted throughout the colon. Terminal ileum is unremarkable. Normal retrocecal appendix is noted in axial image 42. No distal colitis or diverticulitis. No distal colonic obstruction.   Vascular/Lymphatic: No retroperitoneal or mesenteric adenopathy. No aortic aneurysm. No inguinal adenopathy.   Reproductive: Prostate gland and seminal vesicles are unremarkable. No pelvic masses noted.   Other: No ascites or free abdominal air   Musculoskeletal: Sagittal images of the spine shows no destructive bony lesions. Minimal disc bulge scratch minimal posterior disc bulge at L5-S1 level.   IMPRESSION: 1. There is no evidence of acute inflammatory process within abdomen. 2. No hydronephrosis or hydroureter. 3. No small bowel obstruction. 4. Normal appendix.  No pericecal inflammation. 5. No distal colitis or diverticulitis. No distal  colonic obstruction. 6. No ascites or free abdominal air.     Electronically Signed   By: Lahoma Crocker M.D.   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 Johney Maine, history of diverticulosis and internal hemorrhoids, GERD and dyspepsia who is here for follow-up  1. Rectal bleeding/hemorrhoids/history of anal condyloma with AIN -- he had a full colonoscopy less than 3 years ago without polyps or colitis. Hemorrhoids were seen at this examination. Due to his increased bleeding I recommended repeating flexible sigmoidoscopy to evaluate the left colon and rectum, rule out colitis, rule out polyps, rule out recurrent AIN and also evaluate hemorrhoids. If etiology is hemorrhoidal he would be a candidate for hemorrhoidal banding which we also discussed today. He states internal hemorrhoids of been an issue on and off for him for greater than 20 years.  2. Dyspepsia with epigastric pain -- seems to be improving with reinitiation of PPI therapy. He had an upper endoscopy performed 5 years ago which revealed ulcers biopsies negative for H. pylori. Due to this recurrent pain I recommended repeat EGD. We discussed the risks, benefits and  alternatives and he wishes to proceed. He will continue pantoprazole 40 mg twice a day before meals for now and he can continue Pepcid twice a day if this is helping. I would anticipate PPI twice a day would be enough acid suppression for him.

## 2016-09-15 NOTE — Patient Instructions (Signed)
You have been scheduled for an endoscopy and flexible sigmoidoscopy. Please follow the written instructions given to you at your visit today. If you use inhalers (even only as needed), please bring them with you on the day of your procedure. Your physician has requested that you go to www.startemmi.com and enter the access code given to you at your visit today. This web site gives a general overview about your procedure. However, you should still follow specific instructions given to you by our office regarding your preparation for the procedure.  Continue Protonix and Pepcid.  If you are age 65 or older, your body mass index should be between 23-30. Your Body mass index is 37.56 kg/m. If this is out of the aforementioned range listed, please consider follow up with your Primary Care Provider.  If you are age 4 or younger, your body mass index should be between 19-25. Your Body mass index is 37.56 kg/m. If this is out of the aformentioned range listed, please consider follow up with your Primary Care Provider.

## 2016-09-16 ENCOUNTER — Telehealth: Payer: Self-pay | Admitting: *Deleted

## 2016-09-16 NOTE — Telephone Encounter (Signed)
Patient left message in triage, stated he had heard we have patient assistance for cialis.   Patient has an appointment at [facility] tomorrow.  He will follow up there first to see if they have access to patient assistance programs. Landis Gandy, RN

## 2016-09-17 ENCOUNTER — Ambulatory Visit: Payer: Self-pay | Attending: Internal Medicine

## 2016-09-20 ENCOUNTER — Ambulatory Visit: Payer: Self-pay | Attending: Internal Medicine | Admitting: Internal Medicine

## 2016-09-20 ENCOUNTER — Encounter: Payer: Self-pay | Admitting: Internal Medicine

## 2016-09-20 ENCOUNTER — Other Ambulatory Visit: Payer: Self-pay | Admitting: Internal Medicine

## 2016-09-20 VITALS — BP 124/78 | HR 67 | Temp 98.1°F | Resp 16 | Wt 245.4 lb

## 2016-09-20 DIAGNOSIS — K649 Unspecified hemorrhoids: Secondary | ICD-10-CM

## 2016-09-20 DIAGNOSIS — K625 Hemorrhage of anus and rectum: Secondary | ICD-10-CM

## 2016-09-20 DIAGNOSIS — F329 Major depressive disorder, single episode, unspecified: Secondary | ICD-10-CM | POA: Insufficient documentation

## 2016-09-20 DIAGNOSIS — F17210 Nicotine dependence, cigarettes, uncomplicated: Secondary | ICD-10-CM | POA: Insufficient documentation

## 2016-09-20 DIAGNOSIS — Z720 Tobacco use: Secondary | ICD-10-CM

## 2016-09-20 DIAGNOSIS — Z09 Encounter for follow-up examination after completed treatment for conditions other than malignant neoplasm: Secondary | ICD-10-CM | POA: Insufficient documentation

## 2016-09-20 DIAGNOSIS — K219 Gastro-esophageal reflux disease without esophagitis: Secondary | ICD-10-CM

## 2016-09-20 DIAGNOSIS — Z79899 Other long term (current) drug therapy: Secondary | ICD-10-CM | POA: Insufficient documentation

## 2016-09-20 DIAGNOSIS — N529 Male erectile dysfunction, unspecified: Secondary | ICD-10-CM | POA: Insufficient documentation

## 2016-09-20 DIAGNOSIS — B20 Human immunodeficiency virus [HIV] disease: Secondary | ICD-10-CM | POA: Insufficient documentation

## 2016-09-20 DIAGNOSIS — I10 Essential (primary) hypertension: Secondary | ICD-10-CM | POA: Insufficient documentation

## 2016-09-20 DIAGNOSIS — F419 Anxiety disorder, unspecified: Secondary | ICD-10-CM | POA: Insufficient documentation

## 2016-09-20 MED ORDER — TADALAFIL 20 MG PO TABS
20.0000 mg | ORAL_TABLET | Freq: Every day | ORAL | 0 refills | Status: DC | PRN
Start: 2016-09-20 — End: 2016-10-14

## 2016-09-20 NOTE — Patient Instructions (Addendum)
For your tobacco abuse: Strongly recommend that he stop smoking back and all other inhaled products right away Call 1-800-Quit-Now to get free nicotine replacement from the state of [place]   -   Steps to Quit Smoking Smoking tobacco can be bad for your health. It can also affect almost every organ in your body. Smoking puts you and people around you at risk for many serious long-lasting (chronic) diseases. Quitting smoking is hard, but it is one of the best things that you can do for your health. It is never too late to quit. What are the benefits of quitting smoking? When you quit smoking, you lower your risk for getting serious diseases and conditions. They can include:  Lung cancer or lung disease.  Heart disease.  Stroke.  Heart attack.  Not being able to have children (infertility).  Weak bones (osteoporosis) and broken bones (fractures). If you have coughing, wheezing, and shortness of breath, those symptoms may get better when you quit. You may also get sick less often. If you are pregnant, quitting smoking can help to lower your chances of having a baby of low birth weight. What can I do to help me quit smoking? Talk with your doctor about what can help you quit smoking. Some things you can do (strategies) include:  Quitting smoking totally, instead of slowly cutting back how much you smoke over a period of time.  Going to in-person counseling. You are more likely to quit if you go to many counseling sessions.  Using resources and support systems, such as:  Online chats with a Social worker.  Phone quitlines.  Printed Furniture conservator/restorer.  Support groups or group counseling.  Text messaging programs.  Mobile phone apps or applications.  Taking medicines. Some of these medicines may have nicotine in them. If you are pregnant or breastfeeding, do not take any medicines to quit smoking unless your doctor says it is okay. Talk with your doctor about counseling or other  things that can help you. Talk with your doctor about using more than one strategy at the same time, such as taking medicines while you are also going to in-person counseling. This can help make quitting easier. What things can I do to make it easier to quit? Quitting smoking might feel very hard at first, but there is a lot that you can do to make it easier. Take these steps:  Talk to your family and friends. Ask them to support and encourage you.  Call phone quitlines, reach out to support groups, or work with a Social worker.  Ask people who smoke to not smoke around you.  Avoid places that make you want (trigger) to smoke, such as:  Bars.  Parties.  Smoke-break areas at work.  Spend time with people who do not smoke.  Lower the stress in your life. Stress can make you want to smoke. Try these things to help your stress:  Getting regular exercise.  Deep-breathing exercises.  Yoga.  Meditating.  Doing a body scan. To do this, close your eyes, focus on one area of your body at a time from head to toe, and notice which parts of your body are tense. Try to relax the muscles in those areas.  Download or buy apps on your mobile phone or tablet that can help you stick to your quit plan. There are many free apps, such as QuitGuide from the [employer] Office manager for Disease Control and Prevention). You can find more support from smokefree.gov and other websites. This  information is not intended to replace advice given to you by your health care provider. Make sure you discuss any questions you have with your health care provider. Document Released: 04/10/2009 Document Revised: 02/10/2016 Document Reviewed: 10/29/2014 Elsevier Interactive Patient Education  2017 [place] for Gastroesophageal Reflux Disease, Adult When you have gastroesophageal reflux disease (GERD), the foods you eat and your eating habits are very important. Choosing the right foods can help ease your  discomfort. What guidelines do I need to follow?  Choose fruits, vegetables, whole grains, and low-fat dairy products.  Choose low-fat meat, fish, and poultry.  Limit fats such as oils, salad dressings, butter, nuts, and avocado.  Keep a food diary. This helps you identify foods that cause symptoms.  Avoid foods that cause symptoms. These may be different for everyone.  Eat small meals often instead of 3 large meals a day.  Eat your meals slowly, in a place where you are relaxed.  Limit fried foods.  Cook foods using methods other than frying.  Avoid drinking alcohol.  Avoid drinking large amounts of liquids with your meals.  Avoid bending over or lying down until 2-3 hours after eating. What foods are not recommended? These are some foods and drinks that may make your symptoms worse: Vegetables  Tomatoes. Tomato juice. Tomato and spaghetti sauce. Chili peppers. Onion and garlic. Horseradish. Fruits  Oranges, grapefruit, and lemon (fruit and juice). Meats  High-fat meats, fish, and poultry. This includes hot dogs, ribs, ham, sausage, salami, and bacon. Dairy  Whole milk and chocolate milk. Sour cream. Cream. Butter. Ice cream. Cream cheese. Drinks  Coffee and tea. Bubbly (carbonated) drinks or energy drinks. Condiments  Hot sauce. Barbecue sauce. Sweets/Desserts  Chocolate and cocoa. Donuts. Peppermint and spearmint. Fats and Oils  High-fat foods. This includes [place] fries and potato chips. Other  Vinegar. Strong spices. This includes black pepper, white pepper, red pepper, cayenne, curry powder, cloves, ginger, and chili powder. The items listed above may not be a complete list of foods and drinks to avoid. Contact your dietitian for more information.  This information is not intended to replace advice given to you by your health care provider. Make sure you discuss any questions you have with your health care provider. Document Released: 12/14/2011 Document Revised:  11/20/2015 Document Reviewed: 04/18/2013 Elsevier Interactive Patient Education  2017 [employer].

## 2016-09-20 NOTE — Progress Notes (Signed)
Shawn Brooks, is a 41 y.o. male  YKD:[id number]  LZJ:[id number]  DOB - 1975-07-09  Chief Complaint  Patient presents with  . Follow-up        Subjective:   Shawn Brooks is a 41 y.o. male here today for a follow up visit for rectal bleeding and abd pain. Pains improved currently. No further rectal bleeding. He is taking his protonix. He was unable to get carafate due to cost.  Pt saw Dr Hilarie Fredrickson 09/15/16 and has planned flex sig and egd in April as well.  He requested a renewal of cialis rx as well, last time was provided by Dr Linus Salmons, [place] 01/21/16 at 20mg  prn.  Pt is still smoking 1/2 ppd.   Patient has No headache, No chest pain, No abdominal pain - No Nausea, No new weakness tingling or numbness, No Cough - SOB.  No problems updated.  ALLERGIES: No Known Allergies  PAST MEDICAL HISTORY: Past Medical History:  Diagnosis Date  . Anxiety   . Blood dyscrasia    HIV  . Chronic back pain   . Depression   . DJD (degenerative joint disease)   . Gastric ulcer   . Headache(784.0)   . HIV infection ([place])   . Hyperplastic colon polyp   . Hypertension     MEDICATIONS AT HOME: Prior to Admission medications   Medication Sig Start Date End Date Taking? Authorizing Provider  cyclobenzaprine (FLEXERIL) 10 MG tablet Take 1 tablet (10 mg total) by mouth 2 (two) times daily as needed for muscle spasms. Patient not taking: Reported on 09/13/2016 06/09/16   Doristine Devoid, PA-C  gabapentin (NEURONTIN) 300 MG capsule Take 300 mg by mouth 3 (three) times daily. 03/24/16   Historical Provider, MD  GENVOYA 150-150-200-10 MG TABS tablet TAKE 1 TABLET BY MOUTH EVERY DAY WITH PREZISTA 09/03/16   Thayer Headings, MD  LORazepam (ATIVAN) 0.5 MG tablet Take 1 tablet (0.5 mg total) by mouth every 8 (eight) hours as needed for anxiety (for anxiety/panic attack). Patient not taking: Reported on 05/31/2016 11/12/15   Delsa Grana, PA-C  omeprazole (PRILOSEC) 20 MG capsule TAKE 1 CAPSULE BY MOUTH DAILY  05/13/16   Thayer Headings, MD  pantoprazole (PROTONIX) 40 MG tablet Take 1 tablet (40 mg total) by mouth daily. 09/13/16   Maren Reamer, MD  pravastatin (PRAVACHOL) 20 MG tablet Take 1 tablet (20 mg total) by mouth daily. 07/12/16   Maren Reamer, MD  PREZISTA 800 MG tablet TAKE 1 TABLET BY MOUTH EVERY DAY 09/03/16   Thayer Headings, MD  sucralfate (CARAFATE) 1 g tablet Take 1 tablet (1 g total) by mouth 4 (four) times daily -  with meals and at bedtime. Crush and mix with 3ml best. Patient not taking: Reported on 09/15/2016 09/13/16   Maren Reamer, MD  tadalafil (CIALIS) 20 MG tablet Take 1 tablet (20 mg total) by mouth daily as needed for erectile dysfunction. 09/20/16   Maren Reamer, MD  traMADol (ULTRAM) 50 MG tablet Take 1 tablet (50 mg total) by mouth every 6 (six) hours as needed for severe pain. 09/13/16   Maren Reamer, MD  traZODone (DESYREL) 100 MG tablet Take 300 mg by mouth at bedtime.  07/24/14   Historical Provider, MD  valACYclovir (VALTREX) 500 MG tablet TAKE 1 TABLET BY MOUTH TWICE DAILY AS NEEDED FOR FLARE UPS Patient taking differently: TAKE 500 MG BY MOUTH TWICE DAILY AS NEEDED FOR FLARE UPS 01/02/16   Okey Regal  Comer, MD     Objective:   Vitals:   09/20/16 0929  BP: 124/78  Pulse: 67  Resp: 16  Temp: 98.1 F (36.7 C)  TempSrc: Oral  SpO2: 96%  Weight: 245 lb 6.4 oz (111.3 kg)    Exam General appearance : Awake, alert, not in any distress. Speech Clear. Not toxic looking, pleasant. HEENT: Atraumatic and Normocephalic, pupils equally reactive to light. Neck: supple, no JVD.  Chest:Good air entry bilaterally, no added sounds. CVS: S1 S2 regular, no murmurs/gallups or rubs. Abdomen: Bowel sounds active, Non tender and not distended with no gaurding, rigidity or rebound. Extremities: B/L Lower Ext shows no edema, both legs are warm to touch Neurology: Awake alert, and oriented X 3, CN II-XII grossly intact, Non focal Skin:No Rash  Data Review Lab Results   Component Value Date   HGBA1C 5.7 (H) 11/16/2013    Depression screen PHQ 2/9 09/20/2016 09/13/2016 07/12/2016 05/31/2016 02/16/2016  Decreased Interest 1 1 1  0 1  Down, Depressed, Hopeless 1 1 1  0 1  PHQ - 2 Score 2 2 2  0 2  Altered sleeping 1 0 1 - 1  Tired, decreased energy 0 1 1 - 1  Change in appetite 0 0 0 - 1  Feeling bad or failure about yourself  1 1 1  - 1  Trouble concentrating 1 1 1  - 1  Moving slowly or fidgety/restless 0 1 0 - 0  Suicidal thoughts 0 0 0 - 0  PHQ-9 Score 5 6 6  - 7  Difficult doing work/chores - - - - -  Some recent data might be hidden      Assessment & Plan   1. Hemorrhoids, unspecified hemorrhoid type High fiber diet encouraged  2. BRBPR (bright red blood per rectum) See #1, has flex sig pending per GI.  3. Tobacco abuse Encouraged total cessation, tob can increase acid reflux sx, as well as long term adverse effects. For your tobacco abuse: Strongly recommend that he stop smoking back and all other inhaled products right away Call 1-800-Quit-Now to get free nicotine replacement from the state of [place]    4. Gastroesophageal reflux disease without esophagitis On ppi, has egd pending per GI  5. Ed - renewed cialis 20mg  tabs, prn  6. hiv Per [facility]/Dr Comer.     Patient have been counseled extensively about nutrition and exercise  Return in about 3 months (around 12/21/2016), or if symptoms worsen or fail to improve.  The patient was given clear instructions to go to ER or return to medical center if symptoms don't improve, worsen or new problems develop. The patient verbalized understanding. The patient was told to call to get lab results if they haven't heard anything in the next week.   This note has been created with Surveyor, quantity. Any transcriptional errors are unintentional.   Maren Reamer, MD, [place] [facility] [place], [place]     09/20/2016, 10:15 AM

## 2016-09-28 ENCOUNTER — Telehealth: Payer: Self-pay

## 2016-09-28 NOTE — Telephone Encounter (Signed)
[facility] contacted the office and stated pt's protonix was not covered under pt insurance they would like to replace it with prilosec. Gave the okay to switch medication due to insurance

## 2016-09-28 NOTE — Telephone Encounter (Signed)
[facility] contacted the office again and I spoke with [employer] he wanted to clarify the mg for the prilosec stated most provider recommend the 20mg  instead of 40mg . Gave the same mg as in chart.   Pt contacted the office and stated [facility] gave him a call and that the prilosec worked good for him but Dr. Janne [facility] took him off because it was messing up his kidneys. Pt states he has been taking the Famotidine 20mg  and Ranitidine 150mg . Pt states the pharmacy told him that those medications are in the same group and doesn't know why he is taking both of them. Pt is wanting to know what medications can he take for his Gastroesophageal Reflux Disease?

## 2016-09-29 ENCOUNTER — Other Ambulatory Visit: Payer: Self-pay | Admitting: Internal Medicine

## 2016-10-01 ENCOUNTER — Telehealth: Payer: Self-pay

## 2016-10-01 NOTE — Telephone Encounter (Signed)
Contacted pt and made pt aware that per Dr. Doreene Burke to continue the same medication he has been taking.   Pt states he has been burping and when he do there is hot coming up and a nasty taste in the back of his mouth. Pt states what could this be from and how can he prevent it?

## 2016-10-14 ENCOUNTER — Other Ambulatory Visit: Payer: Self-pay | Admitting: *Deleted

## 2016-10-14 MED ORDER — TADALAFIL 20 MG PO TABS: 20.0000 mg | ORAL_TABLET | Freq: Every day | ORAL | 3 refills | Status: DC | PRN

## 2016-10-14 NOTE — Telephone Encounter (Signed)
PRINTED PASS PROGRAM

## 2016-10-20 ENCOUNTER — Ambulatory Visit (AMBULATORY_SURGERY_CENTER): Payer: Self-pay | Admitting: Internal Medicine

## 2016-10-20 ENCOUNTER — Encounter: Payer: Self-pay | Admitting: Internal Medicine

## 2016-10-20 VITALS — BP 116/62 | HR 62 | Temp 99.6°F | Resp 10 | Ht 68.0 in | Wt 245.0 lb

## 2016-10-20 DIAGNOSIS — K625 Hemorrhage of anus and rectum: Secondary | ICD-10-CM

## 2016-10-20 DIAGNOSIS — K219 Gastro-esophageal reflux disease without esophagitis: Secondary | ICD-10-CM

## 2016-10-20 DIAGNOSIS — K2990 Gastroduodenitis, unspecified, without bleeding: Secondary | ICD-10-CM

## 2016-10-20 DIAGNOSIS — Z8619 Personal history of other infectious and parasitic diseases: Secondary | ICD-10-CM

## 2016-10-20 DIAGNOSIS — R1013 Epigastric pain: Secondary | ICD-10-CM

## 2016-10-20 DIAGNOSIS — K2970 Gastritis, unspecified, without bleeding: Secondary | ICD-10-CM

## 2016-10-20 MED ORDER — SODIUM CHLORIDE 0.9 % IV SOLN: 500.0000 mL | INTRAVENOUS | Status: DC

## 2016-10-20 MED ORDER — RANITIDINE HCL 150 MG PO TABS: 150.0000 mg | ORAL_TABLET | Freq: Two times a day (BID) | ORAL | 5 refills | Status: DC

## 2016-10-20 NOTE — Op Note (Signed)
[place] Patient Name: Shawn Brooks Procedure Date: 10/20/2016 3:17 PM MRN: [id number] Endoscopist: Jerene Bears , MD Age: 41 Referring MD:  Date of Birth: 05-27-1976 Gender: Male Account #: [id number] Procedure:                Upper GI endoscopy Indications:              Epigastric abdominal pain, Dyspepsia Medicines:                Monitored Anesthesia Care Procedure:                Pre-Anesthesia Assessment:                           - Prior to the procedure, a History and Physical                            was performed, and patient medications and                            allergies were reviewed. The patient's tolerance of                            previous anesthesia was also reviewed. The risks                            and benefits of the procedure and the sedation                            options and risks were discussed with the patient.                            All questions were answered, and informed consent                            was obtained. Prior Anticoagulants: The patient has                            taken no previous anticoagulant or antiplatelet                            agents. ASA Grade Assessment: II - A patient with                            mild systemic disease. After reviewing the risks                            and benefits, the patient was deemed in                            satisfactory condition to undergo the procedure.                           After obtaining informed consent, the endoscope was  passed under direct vision. Throughout the                            procedure, the patient's blood pressure, pulse, and                            oxygen saturations were monitored continuously. The                            Endoscope was introduced through the mouth, and                            advanced to the second part of duodenum. The upper                            GI endoscopy was  accomplished without difficulty.                            The patient tolerated the procedure well. Scope In: Scope Out: Findings:                 The examined esophagus was normal.                           A small hiatal hernia was present.                           The entire examined stomach was normal. Biopsies                            were taken with a cold forceps for histology and                            Helicobacter pylori testing.                           The examined duodenum was normal. Complications:            No immediate complications. Estimated Blood Loss:     Estimated blood loss was minimal. Impression:               - Normal esophagus.                           - Small hiatal hernia.                           - Normal stomach. Biopsied.                           - Normal examined duodenum. Recommendation:           - Patient has a contact number available for                            emergencies. The signs and symptoms of potential  delayed complications were discussed with the                            patient. Return to normal activities tomorrow.                            Written discharge instructions were provided to the                            patient.                           - Resume previous diet.                           - Continue present medications.                           - Await pathology results.                           - Proceed with flexible sigmoidoscopy. Jerene Bears, MD 10/20/2016 3:52:32 PM This report has been signed electronically.

## 2016-10-20 NOTE — Progress Notes (Signed)
No problems noted in the recovery room. maw 

## 2016-10-20 NOTE — Op Note (Signed)
[place] Patient Name: Shawn Brooks Procedure Date: 10/20/2016 3:17 PM MRN: [id number] Endoscopist: Jerene Bears , MD Age: 41 Referring MD:  Date of Birth: Jan 24, 1976 Gender: Male Account #: [id number] Procedure:                Flexible Sigmoidoscopy Indications:              Rectal hemorrhage, personal history of anal                            condyloma with AIN (s/p treatment) Medicines:                Monitored Anesthesia Care Procedure:                Pre-Anesthesia Assessment:                           - Prior to the procedure, a History and Physical                            was performed, and patient medications and                            allergies were reviewed. The patient's tolerance of                            previous anesthesia was also reviewed. The risks                            and benefits of the procedure and the sedation                            options and risks were discussed with the patient.                            All questions were answered, and informed consent                            was obtained. Prior Anticoagulants: The patient has                            taken no previous anticoagulant or antiplatelet                            agents. ASA Grade Assessment: II - A patient with                            mild systemic disease. After reviewing the risks                            and benefits, the patient was deemed in                            satisfactory condition to undergo the procedure.  After obtaining informed consent, the scope was                            passed under direct vision. The Endoscope was                            introduced through the anus and advanced to the the                            sigmoid colon. The flexible sigmoidoscopy was                            accomplished without difficulty. The patient                            tolerated the procedure well. The  quality of the                            bowel preparation was good. Scope In: Scope Out: Findings:                 The digital rectal exam was normal. Pertinent                            negatives include no palpable rectal lesions or                            evidence of anal condyloma.                           The examined sigmoid colon and rectum appeared                            normal.                           One 4 mm nodule was found in the distal rectum just                            proximal to the dentate line (seen on retroflex                            view 1st then on forward view). Two biopsies were                            obtained with cold forceps for histology in a                            targeted manner.                           Internal hemorrhoids were found during retroflexion. Complications:            No immediate complications. Estimated Blood Loss:     Estimated blood loss was minimal. Impression:               -  The sigmoid colon and rectum is normal.                           - Nodule in the distal rectum. Biopsied.                           - Internal hemorrhoids. Recommendation:           - Patient has a contact number available for                            emergencies. The signs and symptoms of potential                            delayed complications were discussed with the                            patient. Return to normal activities tomorrow.                            Written discharge instructions were provided to the                            patient.                           - Await pathology results.                           - If biopsies unrevealing hemorrhoidal banding can                            be offered for bleeding internal hemorrhoids. Jerene Bears, MD 10/20/2016 3:58:27 PM This report has been signed electronically.

## 2016-10-20 NOTE — Progress Notes (Signed)
Pt's states no medical or surgical changes since previsit or office visit. 

## 2016-10-20 NOTE — Patient Instructions (Signed)
YOU HAD AN ENDOSCOPIC PROCEDURE TODAY AT [facility]:   Refer to the procedure report that was given to you for any specific questions about what was found during the examination.  If the procedure report does not answer your questions, please call your gastroenterologist to clarify.  If you requested that your care partner not be given the details of your procedure findings, then the procedure report has been included in a sealed envelope for you to review at your convenience later.  YOU SHOULD EXPECT: Some feelings of bloating in the abdomen. Passage of more gas than usual.  Walking can help get rid of the air that was put into your GI tract during the procedure and reduce the bloating. If you had a lower endoscopy (such as a colonoscopy or flexible sigmoidoscopy) you may notice spotting of blood in your stool or on the toilet paper. If you underwent a bowel prep for your procedure, you may not have a normal bowel movement for a few days.  Please Note:  You might notice some irritation and congestion in your nose or some drainage.  This is from the oxygen used during your procedure.  There is no need for concern and it should clear up in a day or so.  SYMPTOMS TO REPORT IMMEDIATELY:   Following lower endoscopy (colonoscopy or flexible sigmoidoscopy):  Excessive amounts of blood in the stool  Significant tenderness or worsening of abdominal pains  Swelling of the abdomen that is new, acute  Fever of 100F or higher   Following upper endoscopy (EGD)  Vomiting of blood or coffee ground material  New chest pain or pain under the shoulder blades  Painful or persistently difficult swallowing  New shortness of breath  Fever of 100F or higher  Black, tarry-looking stools  For urgent or emergent issues, a gastroenterologist can be reached at any hour by calling [phone].   DIET:  We do recommend a small meal at first, but then you may proceed to your regular diet.  Drink  plenty of fluids but you should avoid alco    holic beverages for 24 hours.  ACTIVITY:  You should plan to take it easy for the rest of today and you should NOT DRIVE or use heavy machinery until tomorrow (because of the sedation medicines used during the test).    FOLLOW UP: Our staff will call the number listed on your records the next business day following your procedure to check on you and address any questions or concerns that you may have regarding the information given to you following your procedure. If we do not reach you, we will leave a message.  However, if you are feeling well and you are not experiencing any problems, there is no need to return our call.  We will assume that you have returned to your regular daily activities without incident.  If any biopsies were taken you will be contacted by phone or by letter within the next 1-3 weeks.  Please call us at [phone] if you have not heard about the biopsies in 3 weeks.    SIGNATURES/CONFIDENTIALITY: You and/or your care partner have signed paperwork which will be entered into your electronic medical record.  These signatures attest to the fact that that the information above on your After Visit Summary has been reviewed and is understood.  Full responsibility of the confidentiality of this discharge information lies with you and/or your care-partner.    Handouts were given to your  care partner on a hiatal hernia, [place] system, and hemorrhoids. You may resume your current medications today. Await biopsy results. Please call if any questions or concerns.

## 2016-10-20 NOTE — Progress Notes (Signed)
Report to PACU, RN, vss, BBS= Clear.  

## 2016-10-20 NOTE — Progress Notes (Signed)
Pt asked if he should be taking pepcid and zantac at the same time?  Per Dr. Hilarie Fredrickson, note said pt was taking pantoprazole 40 mg.  Per pt that rx cost too much and did not buy it.  Dr. Hilarie Fredrickson recommended omeprazole 40 mg.  Pt said he had been on that rx and it cause a problem with his kidneys.  So Dr. Hilarie Fredrickson recommended pt take ranitidine 150 mg 1-2 tabe BID as needed #120 refill x5 sent to [facility].  maw

## 2016-10-20 NOTE — Progress Notes (Signed)
Called to room to assist during endoscopic procedure.  Patient ID and intended procedure confirmed with present staff. Received instructions for my participation in the procedure from the performing physician.  

## 2016-10-21 ENCOUNTER — Telehealth: Payer: Self-pay

## 2016-10-21 NOTE — Telephone Encounter (Signed)
  Follow up Call-  Call back number 10/20/2016 05/27/2014  Post procedure Call Back phone  # [phone]  Permission to leave phone message Yes Yes  Some recent data might be hidden     Patient questions:  Do you have a fever, pain , or abdominal swelling? No. Pain Score  0 *  Have you tolerated food without any problems? Yes.    Have you been able to return to your normal activities? Yes.    Do you have any questions about your discharge instructions: Diet   No. Medications  No. Follow up visit  No.  Do you have questions or concerns about your Care? No.  Actions: * If pain score is 4 or above: No action needed, pain <4. Noted bloody liquid when going to bathroom. But stated 20 minutes ago was just a minimal amount. Instructed to call back if continue to bleed. No fever,swelling or tenderness.

## 2016-10-22 ENCOUNTER — Emergency Department (HOSPITAL_COMMUNITY): Payer: Self-pay

## 2016-10-22 ENCOUNTER — Emergency Department (HOSPITAL_COMMUNITY)
Admission: EM | Admit: 2016-10-22 | Discharge: 2016-10-22 | Disposition: A | Discharge status: Home / Self Care | Payer: Self-pay | Attending: Emergency Medicine | Admitting: Emergency Medicine

## 2016-10-22 ENCOUNTER — Encounter (HOSPITAL_COMMUNITY): Payer: Self-pay

## 2016-10-22 DIAGNOSIS — Z79899 Other long term (current) drug therapy: Secondary | ICD-10-CM | POA: Insufficient documentation

## 2016-10-22 DIAGNOSIS — R0789 Other chest pain: Secondary | ICD-10-CM | POA: Insufficient documentation

## 2016-10-22 DIAGNOSIS — F17210 Nicotine dependence, cigarettes, uncomplicated: Secondary | ICD-10-CM | POA: Insufficient documentation

## 2016-10-22 DIAGNOSIS — I10 Essential (primary) hypertension: Secondary | ICD-10-CM | POA: Insufficient documentation

## 2016-10-22 LAB — CBC
HEMATOCRIT: 49.8 % (ref 39.0–52.0)
Hemoglobin: 17.7 g/dL — ABNORMAL HIGH (ref 13.0–17.0)
MCH: 32.1 pg (ref 26.0–34.0)
MCHC: 35.5 g/dL (ref 30.0–36.0)
MCV: 90.2 fL (ref 78.0–100.0)
Platelets: 217 10*3/uL (ref 150–400)
RBC: 5.52 MIL/uL (ref 4.22–5.81)
RDW: 13.5 % (ref 11.5–15.5)
WBC: 8.8 10*3/uL (ref 4.0–10.5)

## 2016-10-22 LAB — I-STAT TROPONIN, ED: TROPONIN I, POC: 0 ng/mL (ref 0.00–0.08)

## 2016-10-22 LAB — BASIC METABOLIC PANEL
ANION GAP: 12 (ref 5–15)
BUN: 15 mg/dL (ref 6–20)
CO2: 20 mmol/L — ABNORMAL LOW (ref 22–32)
Calcium: 9.6 mg/dL (ref 8.9–10.3)
Chloride: 103 mmol/L (ref 101–111)
Creatinine, Ser: 1.49 mg/dL — ABNORMAL HIGH (ref 0.61–1.24)
GFR calc Af Amer: >60 mL/min (ref >60)
GFR, EST NON AFRICAN AMERICAN: 56 mL/min — AB (ref >60)
Glucose, Bld: 111 mg/dL — ABNORMAL HIGH (ref 65–99)
POTASSIUM: 3.6 mmol/L (ref 3.5–5.1)
SODIUM: 135 mmol/L (ref 135–145)

## 2016-10-22 MED ORDER — MORPHINE SULFATE (PF) 4 MG/ML IV SOLN
4.0000 mg | Freq: Once | INTRAVENOUS | Status: AC
  Administered 2016-10-22: 4 mg via INTRAVENOUS
  Filled 2016-10-22: qty 1

## 2016-10-22 MED ORDER — ONDANSETRON HCL 4 MG/2ML IJ SOLN
4.0000 mg | Freq: Once | INTRAMUSCULAR | Status: AC
  Administered 2016-10-22: 4 mg via INTRAVENOUS
  Filled 2016-10-22: qty 2

## 2016-10-22 MED ORDER — IOPAMIDOL (ISOVUE-370) INJECTION 76%
INTRAVENOUS | Status: AC
  Administered 2016-10-22: 72 mL
  Filled 2016-10-22: qty 100

## 2016-10-22 NOTE — ED Triage Notes (Signed)
Per pt, chest pain starting on Monday.  Sudden onset.  On Wednesday, started with diarrhea and nausea.  Chest pain continues.  Shortness of breath.  No cough/congestion.  No increased in activity.  Pt does have GERD and has had medications changed recently.  Has not eaten today.  Pain worse with ambulation.

## 2016-10-22 NOTE — ED Provider Notes (Signed)
[facility] DEPT Provider Note   CSN: [id number] Arrival date & time: 10/22/16  1012     History   Chief Complaint Chief Complaint  Patient presents with  . Chest Pain  . Shortness of Breath    HPI Shawn Brooks is a 41 y.o. male.  41 year old male presents with one-week history of intermittent chest discomfort which has not become persistent 2 days. Has had some associated dyspnea which is worse on exertion. Denies any leg pain but this pain is been slightly pruritic and sharp. Denies any associated diaphoresis or nausea vomiting. No cough or congestion. No fever or chills. No prior history of same. Has been having issues with GERD and was seen by his gastroenterologist on Wednesday and had a colonoscopy as well as endoscopy which according to the patient did not show any acute findings. His current symptoms precede his procedure 2 days ago. Nothing makes his symptoms better no treatment use prior to arrival.      Past Medical History:  Diagnosis Date  . Anxiety   . Blood dyscrasia    HIV  . Chronic back pain   . Depression   . DJD (degenerative joint disease)   . Gastric ulcer   . Headache(784.0)   . HIV infection ([place])   . Hyperplastic colon polyp   . Hypertension     Patient Active Problem List   Diagnosis Date Noted  . Tobacco abuse 05/31/2016  . Lumbar transverse process fracture ([place]) 10/02/2015  . Screening examination for sexually transmitted disease 04/04/2014  . Seizures ([place]) 08/30/2013  . Personal history of digestive disease 01/15/2013  . Thoracic or lumbosacral neuritis or radiculitis 01/15/2013  . H/O gastrointestinal disease 01/15/2013  . Lumbar radiculopathy 01/15/2013  . 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 Condyloma acuminatum 11/17/2012  . 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 AGW (anogenital warts) 11/17/2012  . Hemorrhoid 09/27/2012  . BRBPR (bright red blood per rectum) 07/27/2012  . Prostatitis  04/12/2012  . Constipation 03/09/2012  . Callus 08/16/2011  . Ingrown toenail 08/16/2011  . Chronic low back pain 08/16/2011  . DYSLIPIDEMIA 09/03/2010  . 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 Erectile dysfunction 07/06/2010  . ANXIETY DEPRESSION 05/06/2010  . BURSITIS, KNEE 05/06/2010  . Human immunodeficiency virus (HIV) disease ([place]) 04/16/2010    Past Surgical History:  Procedure Laterality Date  . ESOPHAGOGASTRODUODENOSCOPY  03/27/2012   Procedure: ESOPHAGOGASTRODUODENOSCOPY (EGD);  Surgeon: Lear Ng, MD;  Location: [facility];  Service: Endoscopy;  Laterality: N/A;  . IRRIGATION AND DEBRIDEMENT ABSCESS N/A 03/13/2015   Procedure: IRRIGATION AND DEBRIDEMENT ABSCESS;  Surgeon: Johnathan Hausen, MD;  Location: [facility];  Service: General;  Laterality: N/A;  . WISDOM TOOTH EXTRACTION         Home Medications    Prior to Admission medications   Medication Sig Start Date End Date Taking? Authorizing Provider  cyclobenzaprine (FLEXERIL) 10 MG tablet Take 1 tablet (10 mg total) by mouth 2 (two) times daily as needed for muscle spasms. Patient not taking: Reported on 09/13/2016 06/09/16   Doristine Devoid, PA-C  famotidine (PEPCID) 20 MG tablet TK 1 T PO BID 09/06/16   Historical Provider, MD  gabapentin (NEURONTIN) 300 MG capsule Take 300 mg by mouth 3 (three) times daily. 03/24/16   Historical Provider, MD  GENVOYA 150-150-200-10 MG TABS tablet TAKE 1 TABLET BY MOUTH EVERY DAY WITH PREZISTA 09/29/16   Thayer Headings, MD  LORazepam (ATIVAN) 0.5 MG tablet Take 1 tablet (  0.5 mg total) by mouth every 8 (eight) hours as needed for anxiety (for anxiety/panic attack). Patient not taking: Reported on 05/31/2016 11/12/15   Delsa Grana, PA-C  omeprazole (PRILOSEC) 20 MG capsule TAKE 1 CAPSULE BY MOUTH DAILY 05/13/16   Thayer Headings, MD  pantoprazole (PROTONIX) 40 MG tablet Take 1 tablet (40 mg total) by mouth daily. 09/13/16   Maren Reamer, MD  pravastatin (PRAVACHOL) 20 MG tablet Take 1 tablet (20 mg  total) by mouth daily. 07/12/16   Maren Reamer, MD  PREZISTA 800 MG tablet TAKE 1 TABLET BY MOUTH EVERY DAY 09/29/16   Thayer Headings, MD  ranitidine (ZANTAC) 150 MG tablet Take 1 tablet (150 mg total) by mouth 2 (two) times daily. 10/20/16   Jerene Bears, MD  sucralfate (CARAFATE) 1 g tablet Take 1 tablet (1 g total) by mouth 4 (four) times daily -  with meals and at bedtime. Crush and mix with 13ml best. Patient not taking: Reported on 09/15/2016 09/13/16   Maren Reamer, MD  tadalafil (CIALIS) 20 MG tablet Take 1 tablet (20 mg total) by mouth daily as needed for erectile dysfunction. 10/14/16   Tresa Garter, MD  traMADol (ULTRAM) 50 MG tablet Take 1 tablet (50 mg total) by mouth every 6 (six) hours as needed for severe pain. 09/13/16   Maren Reamer, MD  traZODone (DESYREL) 100 MG tablet Take 300 mg by mouth at bedtime.  07/24/14   Historical Provider, MD  valACYclovir (VALTREX) 500 MG tablet TAKE 1 TABLET BY MOUTH TWICE DAILY AS NEEDED FOR FLARE UPS 09/20/16   Thayer Headings, MD    Family History Family History  Problem Relation Age of Onset  . Cancer Mother     laryngeal  . Pneumonia Father   . Diabetes Father   . Hypertension Sister   . Crohn's disease Brother   . Crohn's disease Maternal Grandmother   . Cancer Maternal Grandmother     patient unsure of type  . Colon cancer Maternal Grandfather     Social History Social History  Substance Use Topics  . Smoking status: Current Every Day Smoker    Packs/day: 0.50    Types: Cigarettes  . Smokeless tobacco: Never Used  . Alcohol use 1.2 oz/week    2 Standard drinks or equivalent per week     Comment: beer at times. not often     Allergies   Patient has no known allergies.   Review of Systems Review of Systems  All other systems reviewed and are negative.    Physical Exam Updated Vital Signs BP 128/90 (BP Location: Right Arm)   Pulse 99   Temp 98.2 F (36.8 C) (Oral)   Resp 20   Ht 5\' 8"  (1.727 m)   Wt  108.9 kg   SpO2 100%   BMI 36.49 kg/m   Physical Exam  Constitutional: He is oriented to person, place, and time. He appears well-developed and well-nourished.  Non-toxic appearance. No distress.  HENT:  Head: Normocephalic and atraumatic.  Eyes: Conjunctivae, EOM and lids are normal. Pupils are equal, round, and reactive to light.  Neck: Normal range of motion. Neck supple. No tracheal deviation present. No thyroid mass present.  Cardiovascular: Normal rate, regular rhythm and normal heart sounds.  Exam reveals no gallop.   No murmur heard. Pulmonary/Chest: Effort normal and breath sounds normal. No stridor. No respiratory distress. He has no decreased breath sounds. He has no wheezes. He has  no rhonchi. He has no rales.  Abdominal: Soft. Normal appearance and bowel sounds are normal. He exhibits no distension. There is no tenderness. There is no rebound and no CVA tenderness.  Musculoskeletal: Normal range of motion. He exhibits no edema or tenderness.  Neurological: He is alert and oriented to person, place, and time. He has normal strength. No cranial nerve deficit or sensory deficit. GCS eye subscore is 4. GCS verbal subscore is 5. GCS motor subscore is 6.  Skin: Skin is warm and dry. No abrasion and no rash noted.  Psychiatric: He has a normal mood and affect. His speech is normal and behavior is normal.  Nursing note and vitals reviewed.    ED Treatments / Results  Labs (all labs ordered are listed, but only abnormal results are displayed) Labs Reviewed  BASIC METABOLIC PANEL - Abnormal; Notable for the following:       Result Value   CO2 20 (*)    Glucose, Bld 111 (*)    Creatinine, Ser 1.49 (*)    GFR calc non Af Amer 56 (*)    All other components within normal limits  CBC - Abnormal; Notable for the following:    Hemoglobin 17.7 (*)    All other components within normal limits  I-STAT TROPOININ, ED    EKG  EKG Interpretation  Date/Time:  Friday October 22 2016  10:17:27 EDT Ventricular Rate:  105 PR Interval:    QRS Duration: 86 QT Interval:  321 QTC Calculation: 425 R Axis:   174 Text Interpretation:  Sinus tachycardia Ventricular premature complex Right axis deviation Abnormal R-wave progression, late transition rate increased from prior Confirmed by Keston Seever  MD, [place] Sigmon ([id number]) on 10/22/2016 11:09:38 AM       Radiology Dg Chest 2 View  Result Date: 10/22/2016 CLINICAL DATA:  Chest pain EXAM: CHEST  2 VIEW COMPARISON:  04/13/2016 FINDINGS: Heart and mediastinal contours are within normal limits. No focal opacities or effusions. No acute bony abnormality. IMPRESSION: No active cardiopulmonary disease. Electronically Signed   By: Rolm Baptise M.D.   On: 10/22/2016 10:42    Procedures Procedures (including critical care time)  Medications Ordered in ED Medications - No data to display   Initial Impression / Assessment and Plan / ED Course  I have reviewed the triage vital signs and the nursing notes.  Pertinent labs & imaging results that were available during my care of the patient were reviewed by me and considered in my medical decision making (see chart for details).     Patient with likely GERD pain given that his workup here with cardiac enzymes as well as chest CT have been negative. Do not think this represents ACS or PE. Instructed to continue with his GERD medication and follow-up with his doctor return precautions given.  Final Clinical Impressions(s) / ED Diagnoses   Final diagnoses:  None    New Prescriptions New Prescriptions   No medications on file     Lacretia Leigh, MD 10/22/16 1400

## 2016-10-22 NOTE — ED Notes (Signed)
Bed: WA03 Expected date:  Expected time:  Means of arrival:  Comments: 

## 2016-10-25 ENCOUNTER — Ambulatory Visit: Payer: Self-pay | Attending: Internal Medicine | Admitting: Internal Medicine

## 2016-10-25 ENCOUNTER — Encounter: Payer: Self-pay | Admitting: Internal Medicine

## 2016-10-25 VITALS — BP 135/76 | HR 85 | Temp 98.5°F | Resp 16 | Wt 238.0 lb

## 2016-10-25 DIAGNOSIS — F419 Anxiety disorder, unspecified: Secondary | ICD-10-CM | POA: Insufficient documentation

## 2016-10-25 DIAGNOSIS — R079 Chest pain, unspecified: Secondary | ICD-10-CM | POA: Insufficient documentation

## 2016-10-25 DIAGNOSIS — R1011 Right upper quadrant pain: Secondary | ICD-10-CM | POA: Insufficient documentation

## 2016-10-25 DIAGNOSIS — B20 Human immunodeficiency virus [HIV] disease: Secondary | ICD-10-CM | POA: Insufficient documentation

## 2016-10-25 DIAGNOSIS — I10 Essential (primary) hypertension: Secondary | ICD-10-CM | POA: Insufficient documentation

## 2016-10-25 DIAGNOSIS — Z79899 Other long term (current) drug therapy: Secondary | ICD-10-CM | POA: Insufficient documentation

## 2016-10-25 DIAGNOSIS — Z8711 Personal history of peptic ulcer disease: Secondary | ICD-10-CM | POA: Insufficient documentation

## 2016-10-25 DIAGNOSIS — K219 Gastro-esophageal reflux disease without esophagitis: Secondary | ICD-10-CM | POA: Insufficient documentation

## 2016-10-25 DIAGNOSIS — F329 Major depressive disorder, single episode, unspecified: Secondary | ICD-10-CM | POA: Insufficient documentation

## 2016-10-25 DIAGNOSIS — F17210 Nicotine dependence, cigarettes, uncomplicated: Secondary | ICD-10-CM | POA: Insufficient documentation

## 2016-10-25 NOTE — Patient Instructions (Addendum)
For your tobacco abuse: Strongly recommend that he stop smoking back and all other inhaled products right away Call 1-800-Quit-Now to get free nicotine replacement from the state of [place]  -  Steps to Quit Smoking Smoking tobacco can be bad for your health. It can also affect almost every organ in your body. Smoking puts you and people around you at risk for many serious long-lasting (chronic) diseases. Quitting smoking is hard, but it is one of the best things that you can do for your health. It is never too late to quit. What are the benefits of quitting smoking? When you quit smoking, you lower your risk for getting serious diseases and conditions. They can include:  Lung cancer or lung disease.  Heart disease.  Stroke.  Heart attack.  Not being able to have children (infertility).  Weak bones (osteoporosis) and broken bones (fractures). If you have coughing, wheezing, and shortness of breath, those symptoms may get better when you quit. You may also get sick less often. If you are pregnant, quitting smoking can help to lower your chances of having a baby of low birth weight. What can I do to help me quit smoking? Talk with your doctor about what can help you quit smoking. Some things you can do (strategies) include:  Quitting smoking totally, instead of slowly cutting back how much you smoke over a period of time.  Going to in-person counseling. You are more likely to quit if you go to many counseling sessions.  Using resources and support systems, such as:  Online chats with a Social worker.  Phone quitlines.  Printed Furniture conservator/restorer.  Support groups or group counseling.  Text messaging programs.  Mobile phone apps or applications.  Taking medicines. Some of these medicines may have nicotine in them. If you are pregnant or breastfeeding, do not take any medicines to quit smoking unless your doctor says it is okay. Talk with your doctor about counseling or other things  that can help you. Talk with your doctor about using more than one strategy at the same time, such as taking medicines while you are also going to in-person counseling. This can help make quitting easier. What things can I do to make it easier to quit? Quitting smoking might feel very hard at first, but there is a lot that you can do to make it easier. Take these steps:  Talk to your family and friends. Ask them to support and encourage you.  Call phone quitlines, reach out to support groups, or work with a Social worker.  Ask people who smoke to not smoke around you.  Avoid places that make you want (trigger) to smoke, such as:  Bars.  Parties.  Smoke-break areas at work.  Spend time with people who do not smoke.  Lower the stress in your life. Stress can make you want to smoke. Try these things to help your stress:  Getting regular exercise.  Deep-breathing exercises.  Yoga.  Meditating.  Doing a body scan. To do this, close your eyes, focus on one area of your body at a time from head to toe, and notice which parts of your body are tense. Try to relax the muscles in those areas.  Download or buy apps on your mobile phone or tablet that can help you stick to your quit plan. There are many free apps, such as QuitGuide from the [employer] Office manager for Disease Control and Prevention). You can find more support from smokefree.gov and other websites. This information is  not intended to replace advice given to you by your health care provider. Make sure you discuss any questions you have with your health care provider. Document Released: 04/10/2009 Document Revised: 02/10/2016 Document Reviewed: 10/29/2014 Elsevier Interactive Patient Education  2017 [place] for Gastroesophageal Reflux Disease, Adult When you have gastroesophageal reflux disease (GERD), the foods you eat and your eating habits are very important. Choosing the right foods can help ease your  discomfort. What guidelines do I need to follow?  Choose fruits, vegetables, whole grains, and low-fat dairy products.  Choose low-fat meat, fish, and poultry.  Limit fats such as oils, salad dressings, butter, nuts, and avocado.  Keep a food diary. This helps you identify foods that cause symptoms.  Avoid foods that cause symptoms. These may be different for everyone.  Eat small meals often instead of 3 large meals a day.  Eat your meals slowly, in a place where you are relaxed.  Limit fried foods.  Cook foods using methods other than frying.  Avoid drinking alcohol.  Avoid drinking large amounts of liquids with your meals.  Avoid bending over or lying down until 2-3 hours after eating. What foods are not recommended? These are some foods and drinks that may make your symptoms worse: Vegetables  Tomatoes. Tomato juice. Tomato and spaghetti sauce. Chili peppers. Onion and garlic. Horseradish. Fruits  Oranges, grapefruit, and lemon (fruit and juice). Meats  High-fat meats, fish, and poultry. This includes hot dogs, ribs, ham, sausage, salami, and bacon. Dairy  Whole milk and chocolate milk. Sour cream. Cream. Butter. Ice cream. Cream cheese. Drinks  Coffee and tea. Bubbly (carbonated) drinks or energy drinks. Condiments  Hot sauce. Barbecue sauce. Sweets/Desserts  Chocolate and cocoa. Donuts. Peppermint and spearmint. Fats and Oils  High-fat foods. This includes [place] fries and potato chips. Other  Vinegar. Strong spices. This includes black pepper, white pepper, red pepper, cayenne, curry powder, cloves, ginger, and chili powder. The items listed above may not be a complete list of foods and drinks to avoid. Contact your dietitian for more information.  This information is not intended to replace advice given to you by your health care provider. Make sure you discuss any questions you have with your health care provider. Document Released: 12/14/2011 Document Revised:  11/20/2015 Document Reviewed: 04/18/2013 Elsevier Interactive Patient Education  2017 [place] for Pancreatitis or Gallbladder Conditions A low-fat diet can be helpful if you have pancreatitis or a gallbladder condition. With these conditions, your pancreas and gallbladder have trouble digesting fats. A healthy eating plan with less fat will help rest your pancreas and gallbladder and reduce your symptoms. What do I need to know about this diet?  Eat a low-fat diet.  Reduce your fat intake to less than 20-30% of your total daily calories. This is less than 50-60 g of fat per day.  Remember that you need some fat in your diet. Ask your dietician what your daily goal should be.  Choose nonfat and low-fat healthy foods. Look for the words "nonfat," "low fat," or "fat free."  As a guide, look on the label and choose foods with less than 3 g of fat per serving. Eat only one serving.  Avoid alcohol.  Do not smoke. If you need help quitting, talk with your health care provider.  Eat small frequent meals instead of three large heavy meals. What foods can I eat? Grains  Include healthy grains and starches such as  potatoes, wheat bread, fiber-rich cereal, and brown rice. Choose whole grain options whenever possible. In adults, whole grains should account for 45-65% of your daily calories. Fruits and Vegetables  Eat plenty of fruits and vegetables. Fresh fruits and vegetables add fiber to your diet. Meats and Other Protein Sources  Eat lean meat such as chicken and pork. Trim any fat off of meat before cooking it. Eggs, fish, and beans are other sources of protein. In adults, these foods should account for 10-35% of your daily calories. Dairy  Choose low-fat milk and dairy options. Dairy includes fat and protein, as well as calcium. Fats and Oils  Limit high-fat foods such as fried foods, sweets, baked goods, sugary drinks. Other  Creamy sauces and condiments, such as  mayonnaise, can add extra fat. Think about whether or not you need to use them, or use smaller amounts or low fat options. What foods are not recommended?  High fat foods, such as:  [employer].  Ice cream.  [place] toast.  Sweet rolls.  Pizza.  Cheese bread.  Foods covered with batter, butter, creamy sauces, or cheese.  Fried foods.  Sugary drinks and desserts.  Foods that cause gas or bloating This information is not intended to replace advice given to you by your health care provider. Make sure you discuss any questions you have with your health care provider. Document Released: 06/19/2013 Document Revised: 11/20/2015 Document Reviewed: 05/28/2013 Elsevier Interactive Patient Education  2017 [employer].

## 2016-10-25 NOTE — Progress Notes (Signed)
Shawn Brooks, is a 41 y.o. male  IOE:[id number]  HWE:[id number]  DOB - 1976-02-28  Chief Complaint  Patient presents with  . GI Problem        Subjective:   Shawn Brooks is a 41 y.o. male here today for a follow up visit for chest pain, was in ED  10/22/16 for CP, ct angio neg for PE, trop flat, ekg w/o acute changes, felt gerd related.  Hx of HIV, gerd, hemorrhoids, recent egd and flex sig.  Pt here for fu. Of note he states he is taking pepcid 40bid currently. Not on ppi due to cost.  Still smoking, but down to 5-6 cigs /day. Avoids etoh for most part.  He has abd discomfort still, bilat uq and meg. Has been avoid tomato sauce/pizza.  No n/v/d.    Patient has No headache, No chest pain, No abdominal pain - No Nausea, No new weakness tingling or numbness, No Cough - SOB.  No problems updated.  ALLERGIES: No Known Allergies  PAST MEDICAL HISTORY: Past Medical History:  Diagnosis Date  . Anxiety   . Blood dyscrasia    HIV  . Chronic back pain   . Depression   . DJD (degenerative joint disease)   . Gastric ulcer   . Headache(784.0)   . HIV infection ([place])   . Hyperplastic colon polyp   . Hypertension     MEDICATIONS AT HOME: Prior to Admission medications   Medication Sig Start Date End Date Taking? Authorizing Provider  cyclobenzaprine (FLEXERIL) 10 MG tablet Take 1 tablet (10 mg total) by mouth 2 (two) times daily as needed for muscle spasms. Patient not taking: Reported on 09/13/2016 06/09/16   Doristine Devoid, PA-C  gabapentin (NEURONTIN) 300 MG capsule Take 300 mg by mouth 3 (three) times daily. 03/24/16   Historical Provider, MD  GENVOYA 150-150-200-10 MG TABS tablet TAKE 1 TABLET BY MOUTH EVERY DAY WITH PREZISTA 09/29/16   Thayer Headings, MD  LORazepam (ATIVAN) 0.5 MG tablet Take 1 tablet (0.5 mg total) by mouth every 8 (eight) hours as needed for anxiety (for anxiety/panic attack). Patient not taking: Reported on 05/31/2016 11/12/15   Delsa Grana, PA-C    omeprazole (PRILOSEC) 20 MG capsule TAKE 1 CAPSULE BY MOUTH DAILY Patient not taking: Reported on 10/22/2016 05/13/16   Thayer Headings, MD  pantoprazole (PROTONIX) 40 MG tablet Take 1 tablet (40 mg total) by mouth daily. Patient not taking: Reported on 10/22/2016 09/13/16   Maren Reamer, MD  pravastatin (PRAVACHOL) 20 MG tablet Take 1 tablet (20 mg total) by mouth daily. 07/12/16   Maren Reamer, MD  PREZISTA 800 MG tablet TAKE 1 TABLET BY MOUTH EVERY DAY 09/29/16   Thayer Headings, MD  sucralfate (CARAFATE) 1 g tablet Take 1 tablet (1 g total) by mouth 4 (four) times daily -  with meals and at bedtime. Crush and mix with 67ml best. Patient not taking: Reported on 09/15/2016 09/13/16   Maren Reamer, MD  tadalafil (CIALIS) 20 MG tablet Take 1 tablet (20 mg total) by mouth daily as needed for erectile dysfunction. 10/14/16   Tresa Garter, MD  traMADol (ULTRAM) 50 MG tablet Take 1 tablet (50 mg total) by mouth every 6 (six) hours as needed for severe pain. Patient not taking: Reported on 10/22/2016 09/13/16   Maren Reamer, MD  traZODone (DESYREL) 100 MG tablet Take 300 mg by mouth at bedtime.  07/24/14   Historical Provider, MD  valACYclovir (  VALTREX) 500 MG tablet TAKE 1 TABLET BY MOUTH TWICE DAILY AS NEEDED FOR FLARE UPS 09/20/16   Thayer Headings, MD     Objective:   Vitals:   10/25/16 0929  BP: 135/76  Pulse: 85  Resp: 16  Temp: 98.5 F (36.9 C)  TempSrc: Oral  SpO2: 95%  Weight: 238 lb (108 kg)    Exam General appearance : Awake, alert, not in any distress. Speech Clear. Not toxic looking HEENT: Atraumatic and Normocephalic, pupils equally reactive to light. Neck: supple, no JVD.  Chest:Good air entry bilaterally, no added sounds. CVS: S1 S2 regular, no murmurs/gallups or rubs. Abdomen: Bowel sounds active, mild ttp bilat uq, R>L, neg Murphy's sign.  Extremities: B/L Lower Ext shows no edema, both legs are warm to touch Neurology: Awake alert, and oriented X 3, CN  II-XII grossly intact, Non focal Skin:No Rash  Data Review Lab Results  Component Value Date   HGBA1C 5.7 (H) 11/16/2013    Depression screen PHQ 2/9 10/25/2016 09/20/2016 09/13/2016 07/12/2016 05/31/2016  Decreased Interest 1 1 1 1  0  Down, Depressed, Hopeless 1 1 1 1  0  PHQ - 2 Score 2 2 2 2  0  Altered sleeping 1 1 0 1 -  Tired, decreased energy 1 0 1 1 -  Change in appetite 0 0 0 0 -  Feeling bad or failure about yourself  1 1 1 1  -  Trouble concentrating 1 1 1 1  -  Moving slowly or fidgety/restless 1 0 1 0 -  Suicidal thoughts 0 0 0 0 -  PHQ-9 Score 7 5 6 6  -  Difficult doing work/chores - - - - -  Some recent data might be hidden    Ct angio 10/22/16 IMPRESSION: No evidence of pulmonary emboli or other acute abnormality in the chest.  10/20/16 egd The examined esophagus was normal. - A small hiatal hernia was present. - The entire examined stomach was normal. Biopsies were taken with a cold forceps for histology and Helicobacter pylori testing. - The examined duodenum was normal.  10/20/16 flex sigm The digital rectal exam was normal. Pertinent negatives include no palpable rectal lesions or evidence of anal condyloma. Findings: - The examined sigmoid colon and rectum appeared normal. - One 4 mm nodule was found in the distal rectum just proximal to the dentate line (seen on retroflex view 1st then on forward view). Two biopsies were obtained with cold forceps for histology in a targeted manner. - Internal hemorrhoids were found during retroflexion.  Assessment & Plan   1. Chest pain, unspecified type - no chest pain currently - Ambulatory referral to Cardiology (may need stresstest, stressors, hld and tob, no family hx of premature cad per pt) - Troponin I  2. Gastroesophageal reflux disease, esophagitis presence not specified - pepcid bid, tums prn - Hepatic Function Panel - recent egd w/ only small, o/w neg study, pending hpylori - gerd diet stressed  3.  RUQ pain,  - eval gb - low fat diet for now, bland diet, avoid etoh recd. - US Abdomen Complete; Future - Lipase - Hepatic Function Panel  4. tob abuse Total cessation recd    Patient have been counseled extensively about nutrition and exercise  Return in about 4 weeks (around 11/22/2016), or if symptoms worsen or fail to improve, for abd pain.  The patient was given clear instructions to go to ER or return to medical center if symptoms don't improve, worsen or new problems develop. The patient  verbalized understanding. The patient was told to call to get lab results if they haven't heard anything in the next week.   This note has been created with Surveyor, quantity. Any transcriptional errors are unintentional.   Maren Reamer, MD, [place] and [facility], [place]   10/25/2016, 11:53 AM

## 2016-10-26 LAB — HEPATIC FUNCTION PANEL
ALT: 35 IU/L (ref 0–44)
AST: 27 IU/L (ref 0–40)
Albumin: 4.6 g/dL (ref 3.5–5.5)
Alkaline Phosphatase: 92 IU/L (ref 39–117)
Bilirubin Total: 0.3 mg/dL (ref 0.0–1.2)
Bilirubin, Direct: 0.11 mg/dL (ref 0.00–0.40)
Total Protein: 7.7 g/dL (ref 6.0–8.5)

## 2016-10-26 LAB — TROPONIN I

## 2016-10-26 LAB — LIPASE: LIPASE: 21 U/L (ref 13–78)

## 2016-10-28 ENCOUNTER — Other Ambulatory Visit: Payer: Self-pay | Admitting: Internal Medicine

## 2016-10-28 ENCOUNTER — Telehealth: Payer: Self-pay | Admitting: Internal Medicine

## 2016-10-28 ENCOUNTER — Encounter: Payer: Self-pay | Admitting: Internal Medicine

## 2016-10-28 NOTE — Telephone Encounter (Signed)
Pt calling to request a refill on his Flexeril. States that at the time the med was prescribed he was unable to pay for it so he never picked it up but now he is able to pay for it and he needs it. Please f/u. Thank you.

## 2016-10-28 NOTE — Telephone Encounter (Signed)
It was a printed prescription from an outside provider so I cannot refill. Will defer to Dr. Janne Napoleon.

## 2016-10-29 ENCOUNTER — Encounter: Payer: Self-pay | Admitting: Internal Medicine

## 2016-10-29 ENCOUNTER — Ambulatory Visit (HOSPITAL_COMMUNITY)
Admission: RE | Admit: 2016-10-29 | Discharge: 2016-10-29 | Disposition: A | Discharge status: Home / Self Care | Payer: Self-pay | Source: Ambulatory Visit | Attending: Internal Medicine | Admitting: Internal Medicine

## 2016-10-29 DIAGNOSIS — R1011 Right upper quadrant pain: Secondary | ICD-10-CM | POA: Insufficient documentation

## 2016-10-29 NOTE — Telephone Encounter (Signed)
PT called again still waiting for a refill for cyclobenzaprine (FLEXERIL) 10 MG tablet   the pharmacy sent a request yesterday and still no answer, can you please follow up

## 2016-11-01 NOTE — Telephone Encounter (Signed)
PT called back to see the statu of the prescription, he claim that the med. Has been prescribe by Dr. Janne Napoleon for the last 2 year, can you please investigate that and follow with PT...Marland KitchenMarland KitchenPlease he is waiting for a call from you

## 2016-11-01 NOTE — Telephone Encounter (Signed)
Patient concern

## 2016-11-02 ENCOUNTER — Other Ambulatory Visit: Payer: Self-pay | Admitting: Internal Medicine

## 2016-11-02 ENCOUNTER — Encounter: Payer: Self-pay | Admitting: Internal Medicine

## 2016-11-02 MED ORDER — CYCLOBENZAPRINE HCL 10 MG PO TABS: 10.0000 mg | ORAL_TABLET | Freq: Two times a day (BID) | ORAL | 0 refills | Status: DC | PRN

## 2016-11-02 NOTE — Telephone Encounter (Signed)
Addressed by Dr. Janne Napoleon yesterday.

## 2016-11-02 NOTE — Telephone Encounter (Signed)
Patient response

## 2016-11-02 NOTE — Progress Notes (Unsigned)
I gave him a 1 time refill of flexeril. thx

## 2016-11-03 ENCOUNTER — Encounter: Payer: Self-pay | Admitting: Cardiology

## 2016-11-04 ENCOUNTER — Telehealth: Payer: Self-pay | Admitting: Internal Medicine

## 2016-11-04 MED ORDER — RANITIDINE HCL 150 MG PO TABS: ORAL_TABLET | ORAL | 5 refills | Status: DC

## 2016-11-04 MED FILL — !CIALIS 20 MG TABLET: 20 | 15 days supply | Qty: 5 | Fill #0

## 2016-11-04 NOTE — Telephone Encounter (Signed)
Pharmacy needing clarification on ranitidine rx. See 10/20/16 nursing notes from colonoscopy procedure. I sent new electronic rx for ranitidine 150 mg 1-2 tablets by mouth twice daily #120 with 5 refills.

## 2016-11-11 ENCOUNTER — Encounter: Payer: Self-pay | Admitting: Internal Medicine

## 2016-11-17 ENCOUNTER — Encounter: Payer: Self-pay | Admitting: Internal Medicine

## 2016-11-17 ENCOUNTER — Ambulatory Visit: Payer: No Typology Code available for payment source | Attending: Internal Medicine | Admitting: Internal Medicine

## 2016-11-17 VITALS — BP 110/73 | HR 74 | Temp 98.4°F | Resp 16 | Wt 235.2 lb

## 2016-11-17 DIAGNOSIS — N183 Chronic kidney disease, stage 3 (moderate): Secondary | ICD-10-CM | POA: Insufficient documentation

## 2016-11-17 DIAGNOSIS — I129 Hypertensive chronic kidney disease with stage 1 through stage 4 chronic kidney disease, or unspecified chronic kidney disease: Secondary | ICD-10-CM | POA: Insufficient documentation

## 2016-11-17 DIAGNOSIS — Z720 Tobacco use: Secondary | ICD-10-CM

## 2016-11-17 DIAGNOSIS — Z21 Asymptomatic human immunodeficiency virus [HIV] infection status: Secondary | ICD-10-CM | POA: Insufficient documentation

## 2016-11-17 DIAGNOSIS — F329 Major depressive disorder, single episode, unspecified: Secondary | ICD-10-CM | POA: Insufficient documentation

## 2016-11-17 DIAGNOSIS — Z8711 Personal history of peptic ulcer disease: Secondary | ICD-10-CM | POA: Insufficient documentation

## 2016-11-17 DIAGNOSIS — K219 Gastro-esophageal reflux disease without esophagitis: Secondary | ICD-10-CM | POA: Insufficient documentation

## 2016-11-17 DIAGNOSIS — B20 Human immunodeficiency virus [HIV] disease: Secondary | ICD-10-CM

## 2016-11-17 DIAGNOSIS — Z131 Encounter for screening for diabetes mellitus: Secondary | ICD-10-CM

## 2016-11-17 DIAGNOSIS — M5416 Radiculopathy, lumbar region: Secondary | ICD-10-CM

## 2016-11-17 DIAGNOSIS — Z79899 Other long term (current) drug therapy: Secondary | ICD-10-CM | POA: Insufficient documentation

## 2016-11-17 DIAGNOSIS — F419 Anxiety disorder, unspecified: Secondary | ICD-10-CM | POA: Insufficient documentation

## 2016-11-17 DIAGNOSIS — F17210 Nicotine dependence, cigarettes, uncomplicated: Secondary | ICD-10-CM | POA: Insufficient documentation

## 2016-11-17 DIAGNOSIS — R7303 Prediabetes: Secondary | ICD-10-CM

## 2016-11-17 LAB — POCT GLYCOSYLATED HEMOGLOBIN (HGB A1C): HEMOGLOBIN A1C: 5.8

## 2016-11-17 MED ORDER — DICLOFENAC SODIUM 1 % TD GEL: 2.0000 g | Freq: Four times a day (QID) | TRANSDERMAL | 2 refills | Status: DC

## 2016-11-17 MED ORDER — PRAVASTATIN SODIUM 20 MG PO TABS: 20.0000 mg | ORAL_TABLET | Freq: Every day | ORAL | 3 refills | Status: DC

## 2016-11-17 MED ORDER — GABAPENTIN 300 MG PO CAPS: 300.0000 mg | ORAL_CAPSULE | Freq: Three times a day (TID) | ORAL | 2 refills | Status: DC

## 2016-11-17 NOTE — Progress Notes (Signed)
Shawn Brooks, is a 41 y.o. male  WGN:[id number]  HQI:[id number]  DOB - 10-06-1975  Chief Complaint  Patient presents with  . Follow-up        Subjective:   Shawn Brooks is a 41 y.o. male here today for a follow up visit, last seen 10/25/16, w/  Hx of gerd, anxiety/depression, hiv on longterm HAART therapy w/ ID.Marland Kitchen Overall, he states he is not getting the left upper quadrant pains/chest pains as much. He notices now that when he eats fatty meals, he may get twinges of the discomfort. He has backed down on fatty foods, which seems to have helped.  He is down to 8cig /day, has nicoderm patches and wants to quit. States his quit day is 11/26/16 and will start the patches than per the smoking cess program.  He had some mild rectal bleeding after colonoscopy, did not go away completely til he caught a "stomach bug". After the diarrhea, he has had no more bleeding.  He is still going to [place], they rx trazodone 300mg  qhs for sleep.    Patient has No headache, No chest pain, No abdominal pain - No Nausea, No new weakness tingling or numbness, No Cough - SOB.  No problems updated.  ALLERGIES: No Known Allergies  PAST MEDICAL HISTORY: Past Medical History:  Diagnosis Date  . Anxiety   . Blood dyscrasia    HIV  . Chronic back pain   . Depression   . DJD (degenerative joint disease)   . Gastric ulcer   . Headache(784.0)   . HIV infection ([place])   . Hyperplastic colon polyp   . Hypertension     MEDICATIONS AT HOME: Prior to Admission medications   Medication Sig Start Date End Date Taking? Authorizing Provider  diclofenac sodium (VOLTAREN) 1 % GEL Apply 2 g topically 4 (four) times daily. 11/17/16   Maren Reamer, MD  gabapentin (NEURONTIN) 300 MG capsule Take 1 capsule (300 mg total) by mouth 3 (three) times daily. 11/17/16   Maren Reamer, MD  GENVOYA 150-150-200-10 MG TABS tablet TAKE 1 TABLET BY MOUTH EVERY DAY WITH PREZISTA 09/29/16   Comer, Okey Regal, MD  pravastatin  (PRAVACHOL) 20 MG tablet Take 1 tablet (20 mg total) by mouth daily. 11/17/16   Maren Reamer, MD  PREZISTA 800 MG tablet TAKE 1 TABLET BY MOUTH EVERY DAY 09/29/16   Comer, Okey Regal, MD  ranitidine (ZANTAC) 150 MG tablet Take 1-2 tablets by mouth twice daily 11/04/16   Pyrtle, Lajuan Lines, MD  tadalafil (CIALIS) 20 MG tablet Take 1 tablet (20 mg total) by mouth daily as needed for erectile dysfunction. 10/14/16   Tresa Garter, MD  traZODone (DESYREL) 100 MG tablet Take 300 mg by mouth at bedtime.  07/24/14   [provider]  valACYclovir (VALTREX) 500 MG tablet TAKE 1 TABLET BY MOUTH TWICE DAILY AS NEEDED FOR FLARE UPS 09/20/16   Thayer Headings, MD     Objective:   Vitals:   11/17/16 0904  BP: 110/73  Pulse: 74  Resp: 16  Temp: 98.4 F (36.9 C)  TempSrc: Oral  SpO2: 95%  Weight: 235 lb 3.2 oz (106.7 kg)    Exam General appearance : Awake, alert, not in any distress. Speech Clear. Not toxic looking, pleasant. HEENT: Atraumatic and Normocephalic, pupils equally reactive to light. Neck: supple, no JVD.  Chest:Good air entry bilaterally, no added sounds. CVS: S1 S2 regular, no murmurs/gallups or rubs. Abdomen: Bowel sounds active, Non  tender and not distended with no gaurding, rigidity or rebound. Extremities: B/L Lower Ext shows no edema, both legs are warm to touch Neurology: Awake alert, and oriented X 3, CN II-XII grossly intact, Non focal Skin:No Rash  Data Review Lab Results  Component Value Date   HGBA1C 5.8 11/17/2016   HGBA1C 5.7 (H) 11/16/2013    Depression screen PHQ 2/9 11/17/2016 10/25/2016 09/20/2016 09/13/2016 07/12/2016  Decreased Interest 0 1 1 1 1   Down, Depressed, Hopeless 1 1 1 1 1   PHQ - 2 Score 1 2 2 2 2   Altered sleeping 1 1 1  0 1  Tired, decreased energy 1 1 0 1 1  Change in appetite 0 0 0 0 0  Feeling bad or failure about yourself  1 1 1 1 1   Trouble concentrating 0 1 1 1 1   Moving slowly or fidgety/restless 0 1 0 1 0  Suicidal thoughts 0 0  0 0 0  PHQ-9 Score 4 7 5 6 6   Difficult doing work/chores - - - - -  Some recent data might be hidden      Assessment & Plan   1. Prediabetes on screening - low carb diet and increase exercise advised for now. Given his ckd 3 and other medications, will hold off on adding metformin for now. Hopefully he can continue his lifestyle moficications. - HgB A1c  5.8  3. Tobacco abuse Total cess rec, quit day June 1 he says. Has patches already.  4. Human immunodeficiency virus (HIV) disease ([place]) Per ID  5. Lumbar radiculopathy Mild pain overall. Wants to avoid narcotics if can. Trial diclofenac cream prn.  6. Hx of chest pain/luq pain - egd/sigmoidoscopy recently w/o acute findings - perhaps fatty food irritation pancrease, although enzymes have been nml - avoid fatty foods best and healthy options     Patient have been counseled extensively about nutrition and exercise  Return in about 3 months (around 02/17/2017), or if symptoms worsen or fail to improve.  The patient was given clear instructions to go to ER or return to medical center if symptoms don't improve, worsen or new problems develop. The patient verbalized understanding. The patient was told to call to get lab results if they haven't heard anything in the next week.   This note has been created with Surveyor, quantity. Any transcriptional errors are unintentional.   Maren Reamer, MD, [place] and [facility] [place], [place]   11/17/2016, 12:28 PM

## 2016-11-17 NOTE — Patient Instructions (Addendum)
Preventing Type 2 Diabetes Mellitus Type 2 diabetes (type 2 diabetes mellitus) is a long-term (chronic) disease that affects blood sugar (glucose) levels. Normally, a hormone called insulin allows glucose to enter cells in the body. The cells use glucose for energy. In type 2 diabetes, one or both of these problems may be present:  The body does not make enough insulin.  The body does not respond properly to insulin that it makes (insulin resistance). Insulin resistance or lack of insulin causes excess glucose to build up in the blood instead of going into cells. As a result, high blood glucose (hyperglycemia) develops, which can cause many complications. Being overweight or obese and having an inactive (sedentary) lifestyle can increase your risk for diabetes. Type 2 diabetes can be delayed or prevented by making certain nutrition and lifestyle changes. What nutrition changes can be made?  Eat healthy meals and snacks regularly. Keep a healthy snack with you for when you get hungry between meals, such as fruit or a handful of nuts.  Eat lean meats and proteins that are low in saturated fats, such as chicken, fish, egg whites, and beans. Avoid processed meats.  Eat plenty of fruits and vegetables and plenty of grains that have not been processed (whole grains). It is recommended that you eat:  1?2 cups of fruit every day.  2?3 cups of vegetables every day.  6?8 oz of whole grains every day, such as oats, whole wheat, bulgur, brown rice, quinoa, and millet.  Eat low-fat dairy products, such as milk, yogurt, and cheese.  Eat foods that contain healthy fats, such as nuts, avocado, olive oil, and canola oil.  Drink water throughout the day. Avoid drinks that contain added sugar, such as soda or sweet tea.  Follow instructions from your health care provider about specific eating or drinking restrictions.  Control how much food you eat at a time (portion size).  Check food labels to find  out the serving sizes of foods.  Use a kitchen scale to weigh amounts of foods.  Saute or steam food instead of frying it. Cook with water or broth instead of oils or butter.  Limit your intake of:  Salt (sodium). Have no more than 1 tsp (2,400 mg) of sodium a day. If you have heart disease or high blood pressure, have less than ? tsp (1,500 mg) of sodium a day.  Saturated fat. This is fat that is solid at room temperature, such as butter or fat on meat. What lifestyle changes can be made?   Activity   Do moderate-intensity physical activity for at least 30 minutes on at least 5 days of the week, or as much as told by your health care provider.  Ask your health care provider what activities are safe for you. A mix of physical activities may be best, such as walking, swimming, cycling, and strength training.  Try to add physical activity into your day. For example:  Park in spots that are farther away than usual, so that you walk more. For example, park in a far corner of the parking lot when you go to the office or the grocery store.  Take a walk during your lunch break.  Use stairs instead of elevators or escalators. Weight Loss   Lose weight as directed. Your health care provider can determine how much weight loss is best for you and can help you lose weight safely.  If you are overweight or obese, you may be instructed to lose at least  5?7 % of your body weight. Alcohol and Tobacco      Do not use any tobacco products, such as cigarettes, chewing tobacco, and e-cigarettes. If you need help quitting, ask your health care provider. Work With [place] Provider   Have your blood glucose tested regularly, as told by your health care provider.  Discuss your risk factors and how you can reduce your risk for diabetes.  Get screening tests as told by your health care provider. You may have screening tests regularly, especially if you have certain risk factors for type 2  diabetes.  Make an appointment with a diet and nutrition specialist (registered dietitian). A registered dietitian can help you make a healthy eating plan and can help you understand portion sizes and food labels. Why are these changes important?  It is possible to prevent or delay type 2 diabetes and related health problems by making lifestyle and nutrition changes.  It can be difficult to recognize signs of type 2 diabetes. The best way to avoid possible damage to your body is to take actions to prevent the disease before you develop symptoms. What can happen if changes are not made?  Your blood glucose levels may keep increasing. Having high blood glucose for a long time is dangerous. Too much glucose in your blood can damage your blood vessels, heart, kidneys, nerves, and eyes.  You may develop prediabetes or type 2 diabetes. Type 2 diabetes can lead to many chronic health problems and complications, such as:  Heart disease.  Stroke.  Blindness.  Kidney disease.  Depression.  Poor circulation in the feet and legs, which could lead to surgical removal (amputation) in severe cases. Where to find support:  Ask your health care provider to recommend a registered dietitian, diabetes educator, or weight loss program.  Look for local or online weight loss groups.  Join a gym, fitness club, or outdoor activity group, such as a walking club. Where to find more information: To learn more about diabetes and diabetes prevention, visit:  American Diabetes Association (ADA): www.diabetes.[employer] of Diabetes and Digestive and Kidney Diseases: FindSpin.nl To learn more about healthy eating, visit:  The U.S. Department of Agriculture Scientist, research (physical sciences)), Choose My Plate: http://wiley-williams.com/  Office of Disease Prevention and Health Promotion (ODPHP), Dietary Guidelines: SurferLive.at Summary  You can reduce your risk  for type 2 diabetes by increasing your physical activity, eating healthy foods, and losing weight as directed.  Talk with your health care provider about your risk for type 2 diabetes. Ask about any blood tests or screening tests that you need to have. This information is not intended to replace advice given to you by your health care provider. Make sure you discuss any questions you have with your health care provider. Document Released: 10/06/2015 Document Revised: 11/20/2015 Document Reviewed: 08/05/2015 Elsevier Interactive Patient Education  2017 [place] to [employer] Exercising regularly is important. It has many health benefits, such as:  Improving your overall fitness, flexibility, and endurance.  Increasing your bone density.  Helping with weight control.  Decreasing your body fat.  Increasing your muscle strength.  Reducing stress and tension.  Improving your overall health. In order to become healthy and stay healthy, it is recommended that you do moderate-intensity and vigorous-intensity exercise. You can tell that you are exercising at a moderate intensity if you have a higher heart rate and faster breathing, but you are still able to hold a conversation. You can  tell that you are exercising at a vigorous intensity if you are breathing much harder and faster and cannot hold a conversation while exercising. How often should I exercise? Choose an activity that you enjoy and set realistic goals. Your health care provider can help you to make an activity plan that works for you. Exercise regularly as directed by your health care provider. This may include:  Doing resistance training twice each week, such as:  Push-ups.  Sit-ups.  Lifting weights.  Using resistance bands.  Doing a given intensity of exercise for a given amount of time. Choose from these options:  150 minutes of moderate-intensity exercise every week.  75 minutes of  vigorous-intensity exercise every week.  A mix of moderate-intensity and vigorous-intensity exercise every week. Children, pregnant women, people who are out of shape, people who are overweight, and older adults may need to consult a health care provider for individual recommendations. If you have any sort of medical condition, be sure to consult your health care provider before starting a new exercise program. What are some exercise ideas? Some moderate-intensity exercise ideas include:  Walking at a rate of 1 mile in 15 minutes.  Biking.  Hiking.  Golfing.  Dancing. Some vigorous-intensity exercise ideas include:  Walking at a rate of at least 4.5 miles per hour.  Jogging or running at a rate of 5 miles per hour.  Biking at a rate of at least 10 miles per hour.  Lap swimming.  Roller-skating or in-line skating.  Cross-country skiing.  Vigorous competitive sports, such as football, basketball, and soccer.  Jumping rope.  Aerobic dancing. What are some everyday activities that can help me to get exercise?  Yard work, such as:  Psychologist, educational.  Raking and bagging leaves.  Washing and waxing your car.  Pushing a stroller.  Shoveling snow.  Gardening.  Washing windows or floors. How can I be more active in my day-to-day activities?  Use the stairs instead of the elevator.  Take a walk during your lunch break.  If you drive, park your car farther away from work or school.  If you take public transportation, get off one stop early and walk the rest of the way.  Make all of your phone calls while standing up and walking around.  Get up, stretch, and walk around every 30 minutes throughout the day. What guidelines should I follow while exercising?  Do not exercise so much that you hurt yourself, feel dizzy, or get very short of breath.  Consult your health care provider before starting a new exercise program.  Wear comfortable clothes and shoes  with good support.  Drink plenty of water while you exercise to prevent dehydration or heat stroke. Body water is lost during exercise and must be replaced.  Work out until you breathe faster and your heart beats faster. This information is not intended to replace advice given to you by your health care provider. Make sure you discuss any questions you have with your health care provider. Document Released: 07/17/2010 Document Revised: 11/20/2015 Document Reviewed: 11/15/2013 Elsevier Interactive Patient Education  2017 [employer].

## 2016-11-24 ENCOUNTER — Ambulatory Visit (INDEPENDENT_AMBULATORY_CARE_PROVIDER_SITE_OTHER): Payer: No Typology Code available for payment source | Admitting: Cardiology

## 2016-11-24 ENCOUNTER — Encounter: Payer: Self-pay | Admitting: Cardiology

## 2016-11-24 DIAGNOSIS — R079 Chest pain, unspecified: Secondary | ICD-10-CM

## 2016-11-24 NOTE — Progress Notes (Signed)
Cardiology Office Note    Date:  11/24/2016   ID:  Shawn Brooks, DOB 03-Jun-1976, MRN [id number]  PCP:  Shawn Reamer, MD  Cardiologist:  Shawn Him, MD   Chief Complaint  Patient presents with  . Chest Pain    History of Present Illness:  Shawn Brooks is a 41 y.o. male who is being seen today for the evaluation of chest pain at the request of Shawn Mussel T, MD.  He has a history of anxiety, HIV, GERD with gastric ulcer and HTN and has been having problems with chest pain.  He says that about 2 weeks ago he was driving home from work and had sudden onset of substernal CP that lasted about 5 minutes and spontaneously resolved on its own but had numbness in both arms lasting several hours. Wednesday the CP occurred again and then lasted as a dull ache until Thursday.  On Thursday he got N/V and diarrhea and when he vomited the pain came back but much more intense and lasted 3-4 hours and he went to the ER with normal cardiac workup.  The pain resolved in the ER with MSO4.  He was seen by his PCP and mentioned that he thought it occurred after eating fried chicken the day before.Since then he has not had any further CP.  He does not get any physical activity due to shin splints.  Otherwise he denies any other episodes of CP.    He has noticed some SOB at different times of the day.  He occasionally wakes up gasping for air.  He also has noticed some DOE during sex and has to stop.  He has a history of tobacco use and  is motivated to quit.  His quit date is June 1st and he will start his nicoderm patch that day.     Past Medical History:  Diagnosis Date  . Anxiety   . Blood dyscrasia    HIV  . Chronic back pain   . Depression   . DJD (degenerative joint disease)   . Gastric ulcer   . Headache(784.0)   . HIV infection ([place])   . Hyperplastic colon polyp   . Hypertension     Past Surgical History:  Procedure Laterality Date  . ESOPHAGOGASTRODUODENOSCOPY  03/27/2012   Procedure: ESOPHAGOGASTRODUODENOSCOPY (EGD);  Surgeon: Shawn Ng, MD;  Location: [facility];  Service: Endoscopy;  Laterality: N/A;  . IRRIGATION AND DEBRIDEMENT ABSCESS N/A 03/13/2015   Procedure: IRRIGATION AND DEBRIDEMENT ABSCESS;  Surgeon: Shawn Hausen, MD;  Location: [facility];  Service: General;  Laterality: N/A;  . WISDOM TOOTH EXTRACTION      Current Medications: Current Meds  Medication Sig  . diclofenac sodium (VOLTAREN) 1 % GEL Apply 2 g topically 4 (four) times daily.  Marland Kitchen gabapentin (NEURONTIN) 300 MG capsule Take 1 capsule (300 mg total) by mouth 3 (three) times daily.  . GENVOYA 150-150-200-10 MG TABS tablet TAKE 1 TABLET BY MOUTH EVERY DAY WITH PREZISTA  . pravastatin (PRAVACHOL) 20 MG tablet Take 1 tablet (20 mg total) by mouth daily.  Marland Kitchen PREZISTA 800 MG tablet TAKE 1 TABLET BY MOUTH EVERY DAY  . ranitidine (ZANTAC) 150 MG tablet Take 1-2 tablets by mouth twice daily  . tadalafil (CIALIS) 20 MG tablet Take 1 tablet (20 mg total) by mouth daily as needed for erectile dysfunction.  . traZODone (DESYREL) 100 MG tablet Take 300 mg by mouth at bedtime.   . valACYclovir (VALTREX) 500 MG tablet  TAKE 1 TABLET BY MOUTH TWICE DAILY AS NEEDED FOR FLARE UPS   Current Facility-Administered Medications for the 11/24/16 encounter (Office Visit) with Shawn Margarita, MD  Medication  . 0.9 %  sodium chloride infusion    Allergies:   Patient has no known allergies.   Social History   Social History  . Marital status: Divorced    Spouse name: N/A  . Number of children: 1  . Years of education: N/A   Occupational History  . Disabled    Social History Main Topics  . Smoking status: Current Every Day Smoker    Packs/day: 0.50    Types: Cigarettes  . Smokeless tobacco: Never Used  . Alcohol use 1.2 oz/week    2 Standard drinks or equivalent per week     Comment: beer at times. not often  . Drug use: Yes    Frequency: 7.0 times per week    Types: Marijuana     Comment:  occ  . Sexual activity: Not Currently    Partners: Male     Comment: accepted condoms   Other Topics Concern  . None   Social History Narrative  . None     Family History:  The patient's family history includes Cancer in his maternal grandmother and mother; Colon cancer in his maternal grandfather; Crohn's disease in his brother and maternal grandmother; Diabetes in his father; Hypertension in his sister; Pneumonia in his father.   ROS:   Please see the history of present illness.    ROS All other systems reviewed and are negative.  No flowsheet data found.     PHYSICAL EXAM:   VS:  BP 118/66   Pulse 81   Ht 5\' 8"  (1.727 m)   Wt 236 lb 12.8 oz (107.4 kg)   SpO2 98%   BMI 36.01 kg/m    GEN: Well nourished, well developed, in no acute distress  HEENT: normal  Neck: no JVD, carotid bruits, or masses Cardiac: RRR; no murmurs, rubs, or gallops,no edema.  Intact distal pulses bilaterally.  Respiratory:  clear to auscultation bilaterally, normal work of breathing GI: soft, nontender, nondistended, + BS MS: no deformity or atrophy  Skin: warm and dry, no rash Neuro:  Alert and Oriented x 3, Strength and sensation are intact Psych: euthymic mood, full affect  Wt Readings from Last 3 Encounters:  11/24/16 236 lb 12.8 oz (107.4 kg)  11/17/16 235 lb 3.2 oz (106.7 kg)  10/25/16 238 lb (108 kg)      Studies/Labs Reviewed:   EKG:  EKG is not ordered today.    Recent Labs: 07/12/2016: TSH 0.97 10/22/2016: BUN 15; Creatinine, Ser 1.49; Hemoglobin 17.7; Platelets 217; Potassium 3.6; Sodium 135 10/25/2016: ALT 35   Lipid Panel    Component Value Date/Time   CHOL 240 (H) 05/12/2016 0955   TRIG 141 05/12/2016 0955   HDL 63 05/12/2016 0955   CHOLHDL 3.8 05/12/2016 0955   VLDL 28 05/12/2016 0955   LDLCALC 149 (H) 05/12/2016 0955    Additional studies/ records that were reviewed today include:  Office notes from PCP    ASSESSMENT:    1. Chest pain, unspecified type        PLAN:  In order of problems listed above:  1. Chest pain that is atypical and has actually improved after reducing the amount of fat in his diet.  His CRFs include toabcco abuse, HIV, HTN and hyperlipidemia.  EKG in ER was nonischemic.  I will get  an ETT to rule out ischemia.  I will also check an echo to rule out pericardial effusion given his HIV.    Medication Adjustments/Labs and Tests Ordered: Current medicines are reviewed at length with the patient today.  Concerns regarding medicines are outlined above.  Medication changes, Labs and Tests ordered today are listed in the Patient Instructions below.  There are no Patient Instructions on file for this visit.   Signed, Shawn Him, MD  11/24/2016 10:25 AM    [address], [place], [place]  [address] Phone: [phone]; Fax: [phone]

## 2016-11-24 NOTE — Patient Instructions (Signed)
Medication Instructions:  Your physician recommends that you continue on your current medications as directed. Please refer to the Current Medication list given to you today.   Labwork: None  Testing/Procedures: Your physician has requested that you have an echocardiogram. Echocardiography is a painless test that uses sound waves to create images of your heart. It provides your doctor with information about the size and shape of your heart and how well your heart's chambers and valves are working. This procedure takes approximately one hour. There are no restrictions for this procedure.   Your physician has requested that you have an exercise tolerance test. For further information please visit [email or web address]. Please also follow instruction sheet, as given.  Follow-Up: Your physician recommends that you schedule a follow-up appointment AS NEEDED with Dr. Radford Pax pending study results.  Any Other Special Instructions Will Be Listed Below (If Applicable).     If you need a refill on your cardiac medications before your next appointment, please call your pharmacy.

## 2016-11-29 MED FILL — !CIALIS 20 MG TABLET: 20 | 29 days supply | Qty: 10 | Fill #1

## 2016-11-30 ENCOUNTER — Ambulatory Visit: Payer: No Typology Code available for payment source | Attending: Internal Medicine

## 2016-11-30 ENCOUNTER — Other Ambulatory Visit (HOSPITAL_COMMUNITY)
Admission: RE | Admit: 2016-11-30 | Discharge: 2016-11-30 | Disposition: A | Discharge status: Home / Self Care | Payer: No Typology Code available for payment source | Source: Ambulatory Visit | Attending: Internal Medicine | Admitting: Internal Medicine

## 2016-11-30 ENCOUNTER — Other Ambulatory Visit (INDEPENDENT_AMBULATORY_CARE_PROVIDER_SITE_OTHER): Payer: No Typology Code available for payment source

## 2016-11-30 DIAGNOSIS — B20 Human immunodeficiency virus [HIV] disease: Secondary | ICD-10-CM

## 2016-11-30 DIAGNOSIS — Z113 Encounter for screening for infections with a predominantly sexual mode of transmission: Secondary | ICD-10-CM

## 2016-12-01 LAB — RPR

## 2016-12-01 LAB — T-HELPER CELL (CD4) - (RCID CLINIC ONLY)
CD4 % Helper T Cell: 28 % — ABNORMAL LOW (ref 33–55)
CD4 T Cell Abs: 640 /uL (ref 400–2700)

## 2016-12-02 ENCOUNTER — Ambulatory Visit (INDEPENDENT_AMBULATORY_CARE_PROVIDER_SITE_OTHER): Payer: No Typology Code available for payment source

## 2016-12-02 ENCOUNTER — Other Ambulatory Visit: Payer: Self-pay

## 2016-12-02 ENCOUNTER — Ambulatory Visit (HOSPITAL_COMMUNITY): Payer: No Typology Code available for payment source | Attending: Cardiology

## 2016-12-02 DIAGNOSIS — R079 Chest pain, unspecified: Secondary | ICD-10-CM | POA: Insufficient documentation

## 2016-12-02 DIAGNOSIS — I10 Essential (primary) hypertension: Secondary | ICD-10-CM | POA: Insufficient documentation

## 2016-12-02 DIAGNOSIS — B20 Human immunodeficiency virus [HIV] disease: Secondary | ICD-10-CM | POA: Insufficient documentation

## 2016-12-02 LAB — EXERCISE TOLERANCE TEST
CHL CUP MPHR: 177 {beats}/min
Estimated workload: 5.9 METS
Exercise duration (min): 4 min
Exercise duration (sec): 7 s
Peak HR: 127 {beats}/min
Percent HR: 71 %
RPE: 17
Rest HR: 65 {beats}/min

## 2016-12-02 LAB — URINE CYTOLOGY ANCILLARY ONLY
CHLAMYDIA, DNA PROBE: NEGATIVE
NEISSERIA GONORRHEA: NEGATIVE

## 2016-12-03 LAB — HIV-1 RNA QUANT-NO REFLEX-BLD
HIV 1 RNA Quant: <20 copies/mL — AB
HIV-1 RNA Quant, Log: <1.3 Log copies/mL — AB

## 2016-12-07 ENCOUNTER — Telehealth: Payer: Self-pay

## 2016-12-07 DIAGNOSIS — M79605 Pain in left leg: Principal | ICD-10-CM

## 2016-12-07 DIAGNOSIS — M79604 Pain in right leg: Secondary | ICD-10-CM

## 2016-12-07 DIAGNOSIS — R079 Chest pain, unspecified: Secondary | ICD-10-CM

## 2016-12-07 NOTE — Telephone Encounter (Signed)
-----   Message from Sueanne Margarita, MD sent at 12/02/2016  7:02 PM EDT ----- Submaximal ETT due to claudication symptoms.  Please get a Lexiscan myoview and also LE arterial doppler

## 2016-12-07 NOTE — Telephone Encounter (Signed)
Informed patient of ECHO and GXT results and verbal understanding expressed.   Leane Call ordered for scheduling. Instructions reviewed with patient. He understands to fast for 2 hours prior to test and avoid nicotine and caffeine for 12 hours prior to test. LEA ordered for scheduling as well. Patient agrees with treatment plan.

## 2016-12-07 NOTE — Telephone Encounter (Signed)
-----   Message from Sueanne Margarita, MD sent at 12/02/2016  7:27 PM EDT ----- Normal LVF with mild LVH and mildly calcified AV, mildly dilated RV

## 2016-12-08 ENCOUNTER — Telehealth (HOSPITAL_COMMUNITY): Payer: Self-pay | Admitting: Cardiology

## 2016-12-09 NOTE — Telephone Encounter (Signed)
12/08/2016 09:19 AM Phone ([place]) Cletus Gash, Kolden M (Self) [phone] (H)   Left Message - Called pt and lmsg for him to CB to get scheduled for myoview.     By Verdene Rio

## 2016-12-14 ENCOUNTER — Ambulatory Visit: Payer: Self-pay | Admitting: Internal Medicine

## 2016-12-15 ENCOUNTER — Telehealth (HOSPITAL_COMMUNITY): Payer: Self-pay | Admitting: *Deleted

## 2016-12-15 NOTE — Telephone Encounter (Signed)
Patient given detailed instructions per Myocardial Perfusion Study Information Sheet for the test on 12/17/16. Patient notified to arrive 15 minutes early and that it is imperative to arrive on time for appointment to keep from having the test rescheduled.  If you need to cancel or reschedule your appointment, please call the office within 24 hours of your appointment. . Patient verbalized understanding. Shawn Brooks

## 2016-12-16 ENCOUNTER — Other Ambulatory Visit: Payer: Self-pay | Admitting: Cardiology

## 2016-12-16 DIAGNOSIS — M79605 Pain in left leg: Principal | ICD-10-CM

## 2016-12-16 DIAGNOSIS — M79604 Pain in right leg: Secondary | ICD-10-CM

## 2016-12-17 ENCOUNTER — Ambulatory Visit (HOSPITAL_COMMUNITY): Payer: No Typology Code available for payment source | Attending: Cardiovascular Disease

## 2016-12-17 DIAGNOSIS — R079 Chest pain, unspecified: Secondary | ICD-10-CM

## 2016-12-17 LAB — MYOCARDIAL PERFUSION IMAGING
CHL CUP NUCLEAR SDS: 2
CHL CUP NUCLEAR SRS: 0
CHL CUP RESTING HR STRESS: 59 {beats}/min
CSEPPHR: 95 {beats}/min
LV dias vol: 161 mL (ref 62–150)
LV sys vol: 84 mL
RATE: 0.27
SSS: 2
TID: 1.12

## 2016-12-17 MED ORDER — REGADENOSON 0.4 MG/5ML IV SOLN
0.4000 mg | Freq: Once | INTRAVENOUS | Status: AC
  Administered 2016-12-17: 0.4 mg via INTRAVENOUS

## 2016-12-17 MED ORDER — TECHNETIUM TC 99M TETROFOSMIN IV KIT
31.7000 | PACK | Freq: Once | INTRAVENOUS | Status: AC | PRN
  Administered 2016-12-17: 31.7 via INTRAVENOUS
  Filled 2016-12-17: qty 32

## 2016-12-17 MED ORDER — TECHNETIUM TC 99M TETROFOSMIN IV KIT
10.5000 | PACK | Freq: Once | INTRAVENOUS | Status: AC | PRN
  Administered 2016-12-17: 10.5 via INTRAVENOUS
  Filled 2016-12-17: qty 11

## 2016-12-22 ENCOUNTER — Encounter: Payer: Self-pay | Admitting: Family Medicine

## 2016-12-22 ENCOUNTER — Ambulatory Visit: Payer: Self-pay | Admitting: Licensed Clinical Social Worker

## 2016-12-22 ENCOUNTER — Ambulatory Visit: Payer: Self-pay | Attending: Family Medicine | Admitting: Family Medicine

## 2016-12-22 ENCOUNTER — Ambulatory Visit (HOSPITAL_COMMUNITY)
Admission: RE | Admit: 2016-12-22 | Discharge: 2016-12-22 | Disposition: A | Discharge status: Home / Self Care | Payer: No Typology Code available for payment source | Source: Ambulatory Visit | Attending: Family Medicine | Admitting: Family Medicine

## 2016-12-22 VITALS — BP 104/71 | HR 81 | Temp 98.2°F | Resp 18 | Ht 68.0 in | Wt 235.4 lb

## 2016-12-22 DIAGNOSIS — M5441 Lumbago with sciatica, right side: Secondary | ICD-10-CM

## 2016-12-22 DIAGNOSIS — B351 Tinea unguium: Secondary | ICD-10-CM | POA: Insufficient documentation

## 2016-12-22 DIAGNOSIS — M5442 Lumbago with sciatica, left side: Secondary | ICD-10-CM | POA: Insufficient documentation

## 2016-12-22 DIAGNOSIS — R6889 Other general symptoms and signs: Secondary | ICD-10-CM

## 2016-12-22 DIAGNOSIS — R7303 Prediabetes: Secondary | ICD-10-CM | POA: Insufficient documentation

## 2016-12-22 DIAGNOSIS — M546 Pain in thoracic spine: Secondary | ICD-10-CM | POA: Insufficient documentation

## 2016-12-22 DIAGNOSIS — G8929 Other chronic pain: Secondary | ICD-10-CM | POA: Insufficient documentation

## 2016-12-22 DIAGNOSIS — F418 Other specified anxiety disorders: Secondary | ICD-10-CM | POA: Insufficient documentation

## 2016-12-22 DIAGNOSIS — Z79899 Other long term (current) drug therapy: Secondary | ICD-10-CM | POA: Insufficient documentation

## 2016-12-22 DIAGNOSIS — F341 Dysthymic disorder: Secondary | ICD-10-CM

## 2016-12-22 DIAGNOSIS — M545 Low back pain: Secondary | ICD-10-CM | POA: Insufficient documentation

## 2016-12-22 DIAGNOSIS — Z131 Encounter for screening for diabetes mellitus: Secondary | ICD-10-CM

## 2016-12-22 DIAGNOSIS — E785 Hyperlipidemia, unspecified: Secondary | ICD-10-CM | POA: Insufficient documentation

## 2016-12-22 LAB — GLUCOSE, POCT (MANUAL RESULT ENTRY): POC GLUCOSE: 105 mg/dL — AB (ref 70–99)

## 2016-12-22 LAB — POCT UA - MICROALBUMIN
CREATININE, POC: 300 mg/dL
MICROALBUMIN (UR) POC: 30 mg/L

## 2016-12-22 MED ORDER — CYCLOBENZAPRINE HCL 10 MG PO TABS: 10.0000 mg | ORAL_TABLET | Freq: Three times a day (TID) | ORAL | 0 refills | Status: DC | PRN

## 2016-12-22 MED ORDER — IBUPROFEN 800 MG PO TABS: 800.0000 mg | ORAL_TABLET | Freq: Three times a day (TID) | ORAL | 0 refills | Status: DC | PRN

## 2016-12-22 MED ORDER — IBUPROFEN 800 MG PO TABS
800.0000 mg | ORAL_TABLET | Freq: Three times a day (TID) | ORAL | 0 refills | Status: DC | PRN
Start: 2016-12-22 — End: 2017-01-27

## 2016-12-22 MED FILL — ?CYCLOBENZAPRINE 10 MG TABL: 10 | 10 days supply | Qty: 30 | Fill #0

## 2016-12-22 MED FILL — IBUPROFEN 800 MG TABLET: 800 | 13 days supply | Qty: 40 | Fill #0

## 2016-12-22 NOTE — Progress Notes (Signed)
Patient is here for f/up  

## 2016-12-22 NOTE — Patient Instructions (Addendum)
Back Pain, Adult Back pain is very common. The pain often gets better over time. The cause of back pain is usually not dangerous. Most people can learn to manage their back pain on their own. Follow these instructions at home: Watch your back pain for any changes. The following actions may help to lessen any pain you are feeling:  Stay active. Start with short walks on flat ground if you can. Try to walk farther each day.  Exercise regularly as told by your doctor. Exercise helps your back heal faster. It also helps avoid future injury by keeping your muscles strong and flexible.  Do not sit, drive, or stand in one place for more than 30 minutes.  Do not stay in bed. Resting more than 1-2 days can slow down your recovery.  Be careful when you bend or lift an object. Use good form when lifting: ? Bend at your knees. ? Keep the object close to your body. ? Do not twist.  Sleep on a firm mattress. Lie on your side, and bend your knees. If you lie on your back, put a pillow under your knees.  Take medicines only as told by your doctor.  Put ice on the injured area. ? Put ice in a plastic bag. ? Place a towel between your skin and the bag. ? Leave the ice on for 20 minutes, 2-3 times a day for the first 2-3 days. After that, you can switch between ice and heat packs.  Avoid feeling anxious or stressed. Find good ways to deal with stress, such as exercise.  Maintain a healthy weight. Extra weight puts stress on your back.  Contact a doctor if:  You have pain that does not go away with rest or medicine.  You have worsening pain that goes down into your legs or buttocks.  You have pain that does not get better in one week.  You have pain at night.  You lose weight.  You have a fever or chills. Get help right away if:  You cannot control when you poop (bowel movement) or pee (urinate).  Your arms or legs feel weak.  Your arms or legs lose feeling (numbness).  You feel  sick to your stomach (nauseous) or throw up (vomit).  You have belly (abdominal) pain.  You feel like you may pass out (faint). This information is not intended to replace advice given to you by your health care provider. Make sure you discuss any questions you have with your health care provider. Document Released: 12/01/2007 Document Revised: 11/20/2015 Document Reviewed: 10/16/2013 Elsevier Interactive Patient Education  2018 [employer].  Prediabetes Eating Plan Prediabetes-also called impaired glucose tolerance or impaired fasting glucose-is a condition that causes blood sugar (blood glucose) levels to be higher than normal. Following a healthy diet can help to keep prediabetes under control. It can also help to lower the risk of type 2 diabetes and heart disease, which are increased in people who have prediabetes. Along with regular exercise, a healthy diet:  Promotes weight loss.  Helps to control blood sugar levels.  Helps to improve the way that the body uses insulin.  What do I need to know about this eating plan?  Use the glycemic index (GI) to plan your meals. The index tells you how quickly a food will raise your blood sugar. Choose low-GI foods. These foods take a longer time to raise blood sugar.  Pay close attention to the amount of carbohydrates in the food that  you eat. Carbohydrates increase blood sugar levels.  Keep track of how many calories you take in. Eating the right amount of calories will help you to achieve a healthy weight. Losing about 7 percent of your starting weight can help to prevent type 2 diabetes.  You may want to follow a Mediterranean diet. This diet includes a lot of vegetables, lean meats or fish, whole grains, fruits, and healthy oils and fats. What foods can I eat? Grains Whole grains, such as whole-wheat or whole-grain breads, crackers, cereals, and pasta. Unsweetened oatmeal. Bulgur. Barley. Quinoa. Brown rice. Corn or whole-wheat flour  tortillas or taco shells. Vegetables Lettuce. Spinach. Peas. Beets. Cauliflower. Cabbage. Broccoli. Carrots. Tomatoes. Squash. Eggplant. Herbs. Peppers. Onions. Cucumbers. Brussels sprouts. Fruits Berries. Bananas. Apples. Oranges. Grapes. Papaya. Mango. Pomegranate. Kiwi. Grapefruit. Cherries. Meats and Other Protein Sources Seafood. Lean meats, such as chicken and [place] or lean cuts of pork and beef. Tofu. Eggs. Nuts. Beans. Dairy Low-fat or fat-free dairy products, such as yogurt, cottage cheese, and cheese. Beverages Water. Tea. Coffee. Sugar-free or diet soda. Seltzer water. Milk. Milk alternatives, such as soy or almond milk. Condiments Mustard. Relish. Low-fat, low-sugar ketchup. Low-fat, low-sugar barbecue sauce. Low-fat or fat-free mayonnaise. Sweets and Desserts Sugar-free or low-fat pudding. Sugar-free or low-fat ice cream and other frozen treats. Fats and Oils Avocado. Walnuts. Olive oil. The items listed above may not be a complete list of recommended foods or beverages. Contact your dietitian for more options. What foods are not recommended? Grains Refined white flour and flour products, such as bread, pasta, snack foods, and cereals. Beverages Sweetened drinks, such as sweet iced tea and soda. Sweets and Desserts Baked goods, such as cake, cupcakes, pastries, cookies, and cheesecake. The items listed above may not be a complete list of foods and beverages to avoid. Contact your dietitian for more information. This information is not intended to replace advice given to you by your health care provider. Make sure you discuss any questions you have with your health care provider. Document Released: 10/29/2014 Document Revised: 11/20/2015 Document Reviewed: 07/10/2014 Elsevier Interactive Patient Education  2017 [employer].

## 2016-12-22 NOTE — BH Specialist Note (Signed)
Integrated Behavioral Health Initial Visit  MRN: [id number] Name: Shawn Brooks   Session Start time: 9:30 AM Session End time: 10:00 AM Total time: 30 minutes  Type of Service: [place] Interpretor:No. Interpretor Name and Language: N/A   Warm Hand Off Completed.       SUBJECTIVE: Shawn Brooks is a 41 y.o. male accompanied by patient. Patient was referred by FNP [place] for anxiety and depression. Patient reports the following symptoms/concerns: chronic back pain, feelings of sadness and worry, difficulty sleeping, low energy, irritability, and forgetfulness Duration of problem: Ongoing; Severity of problem: moderate  OBJECTIVE: Mood: Anxious and Depressed and Affect: Tearful Risk of harm to self or others: No plan to harm self or others   LIFE CONTEXT: Family and Social: Pt resides with partner of eleven years. He receives emotional support from grandmother and uncle who both resides in [place]. Pt has a 1 year old daughter School/Work: Pt is employed at [employer]. He does not receive disability or public benefits Self-Care: Pt participates in medication management. He reports smoking marijuana on a daily basis to cope with mental and physical health Life Changes: Pt is experiencing chronic pain and financial strain  GOALS ADDRESSED: Patient will reduce symptoms of: anxiety and depression and increase knowledge and/or ability of: coping skills and also: Increase adequate support systems for patient/family and Decrease self-medicating behaviors   INTERVENTIONS: Solution-Focused Strategies, Supportive Counseling, Psychoeducation and/or Health Education and Link to [employer]  Standardized Assessments completed: PHQ 2&9  ASSESSMENT: Patient currently experiencing depression and anxiety triggered by chronic pain and financial strain. He reports feelings of sadness and worry, difficulty sleeping, low energy,  irritability, and forgetfulness. Patient may benefit from psychotherapy. He participates in medication management through [facility]. LCSWA educated pt on the correlation between physical and mental health, in addition, to how stress can negatively impact one's health. Pt successfully identified healthy coping skills to implement daily to decrease symptoms. LCSWA provided pt with resources for affordable housing, food insecurity, and crisis intervention.   PLAN: 1. Follow up with behavioral health clinician on : Pt was encouraged to contact LCSWA if symptoms worsen or fail to improve to schedule behavioral appointments at [facility]. 2. Behavioral recommendations: LCSWA recommends that pt apply healthy coping skills discussed, comply with medication management, and utilize provided resources. Pt is encouraged to schedule follow up appointment with LCSWA 3. Referral(s): Armed forces logistics/support/administrative officer (LME/Outside Clinic) and [employer]:  Food and Housing 4. "From scale of 1-10, how likely are you to follow plan?": Sultan Pearlie Nies, LCSW 12/23/16 4:47 PM

## 2016-12-22 NOTE — Progress Notes (Signed)
Subjective:  Patient ID: Shawn Brooks, male    DOB: 01/25/1976  Age: 41 y.o. MRN: [id number]  CC: Back Pain   HPI Shawn Brooks presents for presents evaluation of thoracic and lumbar back problems. Symptoms have been present for several years.  and include paresthesias in bilateral legs. Past medical history of fracture, mild DDD, and disc protusion. Exacerbating factors identifiable by patient are increased intrathoracic pressure (cough, sneeze, etc.), recumbency and movement.. Treatments so far initiated by patient: rest Previous workup in the has included imaging studies. Previous treatments have included NSAIDS and muscle relaxants. PMH of anxiety and depression. He is tearful during office visit while discussing forgetfulness complaint. He denies any SI/HI. He denies any history of recent head injury, visual disturbances, or difficulty keeping his balance. Reports confusing past tense with present tense and losing direction with driving for 1 month. He also complaints of bilateral thickened toenails with bilateral color discoloration. History of dyslipidemia. Denies any cardiac symptoms. Reports adherence with statin medication.  Outpatient Medications Prior to Visit  Medication Sig Dispense Refill  . diclofenac sodium (VOLTAREN) 1 % GEL Apply 2 g topically 4 (four) times daily. 100 g 2  . gabapentin (NEURONTIN) 300 MG capsule Take 1 capsule (300 mg total) by mouth 3 (three) times daily. 90 capsule 2  . GENVOYA 150-150-200-10 MG TABS tablet TAKE 1 TABLET BY MOUTH EVERY DAY WITH PREZISTA 30 tablet 5  . pravastatin (PRAVACHOL) 20 MG tablet Take 1 tablet (20 mg total) by mouth daily. 90 tablet 3  . PREZISTA 800 MG tablet TAKE 1 TABLET BY MOUTH EVERY DAY 30 tablet 5  . ranitidine (ZANTAC) 150 MG tablet Take 1-2 tablets by mouth twice daily 120 tablet 5  . tadalafil (CIALIS) 20 MG tablet Take 1 tablet (20 mg total) by mouth daily as needed for erectile dysfunction. 30 tablet 3  . traZODone  (DESYREL) 100 MG tablet Take 300 mg by mouth at bedtime.   2  . valACYclovir (VALTREX) 500 MG tablet TAKE 1 TABLET BY MOUTH TWICE DAILY AS NEEDED FOR FLARE UPS 60 tablet 5   Facility-Administered Medications Prior to Visit  Medication Dose Route Frequency Provider Last Rate Last Dose  . 0.9 %  sodium chloride infusion  500 mL Intravenous Continuous Pyrtle, Lajuan Lines, MD        ROS Review of Systems  Constitutional: Negative.   Respiratory: Negative.   Cardiovascular: Negative.   Gastrointestinal: Negative.   Musculoskeletal: Positive for back pain.  Skin:       Nail fungus  Neurological:       Forgetfulness  Psychiatric/Behavioral: Positive for dysphoric mood. Negative for suicidal ideas. The patient is nervous/anxious.    Objective:  BP 104/71 (BP Location: Left Arm, Patient Position: Sitting, Cuff Size: Normal)   Pulse 81   Temp 98.2 F (36.8 C) (Oral)   Resp 18   Ht 5\' 8"  (1.727 m)   Wt 235 lb 6.4 oz (106.8 kg)   SpO2 94%   BMI 35.79 kg/m   BP/Weight 12/22/2016 11/24/2016 3/61/4431  Systolic BP 540 086 761  Diastolic BP 71 66 73  Wt. (Lbs) 235.4 236.8 235.2  BMI 35.79 36.01 35.76   Physical Exam  Constitutional: He is oriented to person, place, and time. He appears well-developed and well-nourished.  Cardiovascular: Normal rate, regular rhythm, normal heart sounds and intact distal pulses.   Pulmonary/Chest: Effort normal and breath sounds normal.  Abdominal: Soft. Bowel sounds are normal.  Musculoskeletal:  Thoracic back: He exhibits decreased range of motion, tenderness, pain and spasm.       Lumbar back: He exhibits decreased range of motion, tenderness, pain and spasm.  Neurological: He is alert and oriented to person, place, and time. He has normal reflexes. He displays normal reflexes.  Skin: Skin is warm and dry.  Brown, thickened nail discoloration to bilateral toenails.   Psychiatric: His mood appears anxious. He expresses no homicidal and no suicidal  ideation. He expresses no suicidal plans and no homicidal plans.  tearful  Nursing note and vitals reviewed.  Assessment & Plan:   Problem List Items Addressed This Visit      Other   ANXIETY DEPRESSION   LCSW spoke with patient and provided resources.   Dyslipidemia   Relevant Orders   Lipid Panel   POCT UA - Microalbumin (Completed)   Chronic low back pain - Primary   Relevant Medications   cyclobenzaprine (FLEXERIL) 10 MG tablet   ibuprofen (ADVIL,MOTRIN) 800 MG tablet   Other Relevant Orders   Ambulatory referral to Orthopedics   DG Lumbar Spine Complete (Completed)   Prediabetes   Relevant Orders   Glucose (CBG) (Completed)   POCT UA - Microalbumin (Completed)    Other Visit Diagnoses    Chronic bilateral thoracic back pain       Relevant Medications   cyclobenzaprine (FLEXERIL) 10 MG tablet   ibuprofen (ADVIL,MOTRIN) 800 MG tablet   Other Relevant Orders   DG Thoracic Spine W/Swimmers (Completed)   Forgetfulness       Relevant Orders   Vitamin B12   Ambulatory referral to Neurology   CBC with Differential   Basic Metabolic Panel   T-helper cells CD4/CD8 %   Onychomycosis       Relevant Orders   Ambulatory referral to Podiatry      Meds ordered this encounter  Medications  . DISCONTD: cyclobenzaprine (FLEXERIL) 10 MG tablet    Sig: Take 1 tablet (10 mg total) by mouth 3 (three) times daily as needed for muscle spasms.    Dispense:  30 tablet    Refill:  0    Order Specific Question:   Supervising Provider    Answer:   Tresa Garter [id number]  . DISCONTD: ibuprofen (ADVIL,MOTRIN) 800 MG tablet    Sig: Take 1 tablet (800 mg total) by mouth every 8 (eight) hours as needed (Take with food.).    Dispense:  40 tablet    Refill:  0    Order Specific Question:   Supervising Provider    Answer:   Tresa Garter [id number]  . cyclobenzaprine (FLEXERIL) 10 MG tablet    Sig: Take 1 tablet (10 mg total) by mouth 3 (three) times daily as needed for  muscle spasms.    Dispense:  30 tablet    Refill:  0    Order Specific Question:   Supervising Provider    Answer:   Tresa Garter [id number]  . ibuprofen (ADVIL,MOTRIN) 800 MG tablet    Sig: Take 1 tablet (800 mg total) by mouth every 8 (eight) hours as needed (Take with food.).    Dispense:  40 tablet    Refill:  0    Order Specific Question:   Supervising Provider    Answer:   Tresa Garter [id number]    Follow-up: Return in about 3 months (around 03/24/2017), or if symptoms worsen or fail to improve, for HLD/Pre-DM.   Alfonse Spruce FNP

## 2016-12-23 ENCOUNTER — Encounter: Payer: Self-pay | Admitting: Psychology

## 2016-12-23 LAB — T-HELPER CELLS CD4/CD8 %
% CD 4 Pos. Lymph.: 31.8 % (ref 30.8–58.5)
Absolute CD 4 Helper: 859 /uL (ref 359–1519)
BASOS ABS: 0 10*3/uL (ref 0.0–0.2)
Basos: 0 %
CD3+CD4+ Cells/CD3+CD8+ Cells Bld: 0.64 — ABNORMAL LOW (ref 0.92–3.72)
CD3+CD8+ Cells # Bld: 1337 /uL — ABNORMAL HIGH (ref 109–897)
CD3+CD8+ Cells NFr Bld: 49.5 % — ABNORMAL HIGH (ref 12.0–35.5)
EOS (ABSOLUTE): 0.1 10*3/uL (ref 0.0–0.4)
Eos: 1 %
HEMATOCRIT: 46.9 % (ref 37.5–51.0)
HEMOGLOBIN: 15.9 g/dL (ref 13.0–17.7)
Immature Grans (Abs): 0 10*3/uL (ref 0.0–0.1)
Immature Granulocytes: 0 %
LYMPHS ABS: 2.7 10*3/uL (ref 0.7–3.1)
Lymphs: 33 %
MCH: 30.1 pg (ref 26.6–33.0)
MCHC: 33.9 g/dL (ref 31.5–35.7)
MCV: 89 fL (ref 79–97)
Monocytes Absolute: 0.6 10*3/uL (ref 0.1–0.9)
Monocytes: 8 %
NEUTROS ABS: 4.8 10*3/uL (ref 1.4–7.0)
Neutrophils: 58 %
PLATELETS: 225 10*3/uL (ref 150–379)
RBC: 5.28 x10E6/uL (ref 4.14–5.80)
RDW: 14.2 % (ref 12.3–15.4)
WBC: 8.2 10*3/uL (ref 3.4–10.8)

## 2016-12-23 LAB — BASIC METABOLIC PANEL
BUN/Creatinine Ratio: 9 (ref 9–20)
BUN: 12 mg/dL (ref 6–24)
CALCIUM: 9.5 mg/dL (ref 8.7–10.2)
CO2: 22 mmol/L (ref 20–29)
CREATININE: 1.41 mg/dL — AB (ref 0.76–1.27)
Chloride: 102 mmol/L (ref 96–106)
GFR, EST AFRICAN AMERICAN: 70 mL/min/{1.73_m2} (ref >59)
GFR, EST NON AFRICAN AMERICAN: 61 mL/min/{1.73_m2} (ref >59)
Glucose: 90 mg/dL (ref 65–99)
Potassium: 4.3 mmol/L (ref 3.5–5.2)
SODIUM: 141 mmol/L (ref 134–144)

## 2016-12-23 LAB — LIPID PANEL
CHOL/HDL RATIO: 3.8 ratio (ref 0.0–5.0)
CHOLESTEROL TOTAL: 189 mg/dL (ref 100–199)
HDL: 50 mg/dL (ref >39)
LDL Calculated: 112 mg/dL — ABNORMAL HIGH (ref 0–99)
TRIGLYCERIDES: 133 mg/dL (ref 0–149)
VLDL Cholesterol Cal: 27 mg/dL (ref 5–40)

## 2016-12-23 LAB — VITAMIN B12: Vitamin B-12: 563 pg/mL (ref 232–1245)

## 2016-12-30 ENCOUNTER — Ambulatory Visit (INDEPENDENT_AMBULATORY_CARE_PROVIDER_SITE_OTHER): Payer: Self-pay | Admitting: Internal Medicine

## 2016-12-30 VITALS — BP 129/84 | HR 86 | Temp 98.4°F | Wt 235.0 lb

## 2016-12-30 DIAGNOSIS — B20 Human immunodeficiency virus [HIV] disease: Secondary | ICD-10-CM

## 2016-12-30 DIAGNOSIS — Z720 Tobacco use: Secondary | ICD-10-CM

## 2016-12-30 NOTE — Assessment & Plan Note (Signed)
Doing well, no changes.  rtc 6 months.  

## 2016-12-30 NOTE — Progress Notes (Signed)
   Subjective:    Patient ID: Shawn Brooks, male    DOB: 23-Sep-1975, 41 y.o.   MRN: [id number]  HPI Here for follow up of HIV Continues on Genvoya and no issues.  Denies any missed doses.  No associated nvd.     Review of Systems  Constitutional: Negative for fatigue.  Skin: Negative for rash.  Neurological: Negative for dizziness.       Objective:   Physical Exam  Constitutional: He appears well-developed and well-nourished. No distress.  Eyes: No scleral icterus.  Cardiovascular: Normal rate, regular rhythm and normal heart sounds.   No murmur heard. Pulmonary/Chest: Effort normal and breath sounds normal. No respiratory distress.  Skin: No rash noted.          Assessment & Plan:

## 2016-12-30 NOTE — Assessment & Plan Note (Signed)
counseled

## 2016-12-31 ENCOUNTER — Other Ambulatory Visit: Payer: Self-pay | Admitting: Family Medicine

## 2016-12-31 ENCOUNTER — Other Ambulatory Visit: Payer: Self-pay | Admitting: *Deleted

## 2016-12-31 DIAGNOSIS — E782 Mixed hyperlipidemia: Secondary | ICD-10-CM

## 2016-12-31 MED ORDER — SILDENAFIL CITRATE 50 MG PO TABS: 50.0000 mg | ORAL_TABLET | Freq: Every day | ORAL | 3 refills | Status: DC | PRN

## 2016-12-31 MED ORDER — PRAVASTATIN SODIUM 40 MG PO TABS: 40.0000 mg | ORAL_TABLET | Freq: Every day | ORAL | 2 refills | Status: DC

## 2016-12-31 NOTE — Telephone Encounter (Signed)
PRINTED FOR PASS PROGRAM 

## 2017-01-03 ENCOUNTER — Ambulatory Visit (HOSPITAL_COMMUNITY)
Admission: RE | Admit: 2017-01-03 | Payer: No Typology Code available for payment source | Source: Ambulatory Visit | Attending: Cardiology | Admitting: Cardiology

## 2017-01-03 ENCOUNTER — Other Ambulatory Visit: Payer: Self-pay | Admitting: *Deleted

## 2017-01-03 ENCOUNTER — Telehealth: Payer: Self-pay

## 2017-01-03 MED ORDER — SILDENAFIL CITRATE 50 MG PO TABS: 50.0000 mg | ORAL_TABLET | Freq: Every day | ORAL | 3 refills | Status: DC | PRN

## 2017-01-03 NOTE — Telephone Encounter (Signed)
CMA call regarding lab results   Patient did not answer but left a VM stating the reason of the call & to call back  

## 2017-01-03 NOTE — Telephone Encounter (Signed)
PRINTED FOR PASS PROGRAM 

## 2017-01-03 NOTE — Telephone Encounter (Signed)
-----   Message from Alfonse Spruce, [place] sent at 12/31/2016  5:20 PM EDT ----- X-rays of spine are negative for fracture, misalignment, or spinal disc narrowing.  Lipid levels have improved but LDL cholesterol is still elevated. Your pravastatin dose will be increased.  Vitamin B12 is normal. When decreased it can cause confusion/neurological symptoms.  CD4 count stable from previous lab.   Kidney function normal. Creatinine level is chronically elevated however. Recommend taking blood pressure medications as prescribed. Avoid taking any creatine or protein supplements.

## 2017-01-06 ENCOUNTER — Encounter (HOSPITAL_COMMUNITY): Payer: Self-pay

## 2017-01-21 ENCOUNTER — Other Ambulatory Visit: Payer: Self-pay | Admitting: Cardiology

## 2017-01-21 ENCOUNTER — Ambulatory Visit (HOSPITAL_COMMUNITY)
Admission: RE | Admit: 2017-01-21 | Discharge: 2017-01-21 | Disposition: A | Discharge status: Home / Self Care | Payer: No Typology Code available for payment source | Source: Ambulatory Visit | Attending: Cardiovascular Disease | Admitting: Cardiovascular Disease

## 2017-01-21 DIAGNOSIS — M79605 Pain in left leg: Secondary | ICD-10-CM | POA: Insufficient documentation

## 2017-01-21 DIAGNOSIS — M79604 Pain in right leg: Secondary | ICD-10-CM

## 2017-01-21 DIAGNOSIS — R202 Paresthesia of skin: Secondary | ICD-10-CM

## 2017-01-26 MED FILL — $Viagra 50mg tablet: 50 | 30 days supply | Qty: 10 | Fill #0

## 2017-01-27 ENCOUNTER — Encounter: Payer: Self-pay | Admitting: Family Medicine

## 2017-01-27 ENCOUNTER — Ambulatory Visit: Payer: Self-pay | Attending: Family Medicine | Admitting: Family Medicine

## 2017-01-27 VITALS — BP 96/63 | HR 63 | Temp 97.9°F | Resp 18 | Ht 68.0 in | Wt 227.0 lb

## 2017-01-27 DIAGNOSIS — M79604 Pain in right leg: Secondary | ICD-10-CM | POA: Insufficient documentation

## 2017-01-27 DIAGNOSIS — F17200 Nicotine dependence, unspecified, uncomplicated: Secondary | ICD-10-CM | POA: Insufficient documentation

## 2017-01-27 DIAGNOSIS — R6889 Other general symptoms and signs: Secondary | ICD-10-CM

## 2017-01-27 DIAGNOSIS — M79605 Pain in left leg: Secondary | ICD-10-CM | POA: Insufficient documentation

## 2017-01-27 DIAGNOSIS — M7061 Trochanteric bursitis, right hip: Secondary | ICD-10-CM | POA: Insufficient documentation

## 2017-01-27 DIAGNOSIS — M7062 Trochanteric bursitis, left hip: Secondary | ICD-10-CM | POA: Insufficient documentation

## 2017-01-27 MED ORDER — BUPROPION HCL ER (SR) 150 MG PO TB12: 150.0000 mg | ORAL_TABLET | Freq: Two times a day (BID) | ORAL | 3 refills | Status: DC

## 2017-01-27 MED ORDER — CYCLOBENZAPRINE HCL 10 MG PO TABS: 10.0000 mg | ORAL_TABLET | Freq: Three times a day (TID) | ORAL | 0 refills | Status: DC | PRN

## 2017-01-27 MED ORDER — IBUPROFEN 800 MG PO TABS: 800.0000 mg | ORAL_TABLET | Freq: Three times a day (TID) | ORAL | 0 refills | Status: DC | PRN

## 2017-01-27 MED FILL — ?CYCLOBENZAPRINE 10 MG TABL: 10 | 10 days supply | Qty: 30 | Fill #0

## 2017-01-27 MED FILL — IBUPROFEN 800 MG TABLET: 800 | 13 days supply | Qty: 40 | Fill #0

## 2017-01-27 NOTE — Patient Instructions (Signed)
Bursitis Bursitis is inflammation and irritation of a bursa, which is one of the small, fluid-filled sacs that cushion and protect the moving parts of your body. These sacs are located between bones and muscles, muscle attachments, or skin areas next to bones. A bursa protects these structures from the wear and tear that results from frequent movement. An inflamed bursa causes pain and swelling. Fluid may build up inside the sac. Bursitis is most common near joints, especially the knees, elbows, hips, and shoulders. What are the causes? Bursitis can be caused by:  Injury from: ? A direct blow, like falling on your knee or elbow. ? Overuse of a joint (repetitive stress).  Infection. This can happen if bacteria gets into a bursa through a cut or scrape near a joint.  Diseases that cause joint inflammation, such as gout and rheumatoid arthritis.  What increases the risk? You may be at risk for bursitis if you:  Have a job or hobby that involves a lot of repetitive stress on your joints.  Have a condition that weakens your body's defense system (immune system), such as diabetes, cancer, or HIV.  Lift and reach overhead often.  Kneel or lean on hard surfaces often.  Run or walk often.  What are the signs or symptoms? The most common signs and symptoms of bursitis are:  Pain that gets worse when you move the affected body part or put weight on it.  Inflammation.  Stiffness.  Other signs and symptoms may include:  Redness.  Tenderness.  Warmth.  Pain that continues after rest.  Fever and chills. This may occur in bursitis caused by infection.  How is this diagnosed? Bursitis may be diagnosed by:  Medical history and physical exam.  MRI.  A procedure to drain fluid from the bursa with a needle (aspiration). The fluid may be checked for signs of infection or gout.  Blood tests to rule out other causes of inflammation.  How is this treated? Bursitis can usually be  treated at home with rest, ice, compression, and elevation (RICE). For mild bursitis, RICE treatment may be all you need. Other treatments may include:  Nonsteroidal anti-inflammatory drugs (NSAIDs) to treat pain and inflammation.  Corticosteroids to fight inflammation. You may have these drugs injected into and around the area of bursitis.  Aspiration of bursitis fluid to relieve pain and improve movement.  Antibiotic medicine to treat an infected bursa.  A splint, brace, or walking aid.  Physical therapy if you continue to have pain or limited movement.  Surgery to remove a damaged or infected bursa. This may be needed if you have a very bad case of bursitis or if other treatments have not worked.  Follow these instructions at home:  Take medicines only as directed by your health care provider.  If you were prescribed an antibiotic medicine, finish it all even if you start to feel better.  Rest the affected area as directed by your health care provider. ? Keep the area elevated. ? Avoid activities that make pain worse.  Apply ice to the injured area: ? Place ice in a plastic bag. ? Place a towel between your skin and the bag. ? Leave the ice on for 20 minutes, 2-3 times a day.  Use splints, braces, pads, or walking aids as directed by your health care provider.  Keep all follow-up visits as directed by your health care provider. This is important. How is this prevented?  Wear knee pads if you kneel often.    Wear sturdy running or walking shoes that fit you well.  Take regular breaks from repetitive activity.  Warm up by stretching before doing any strenuous activity.  Maintain a healthy weight or lose weight as recommended by your health care provider. Ask your health care provider if you need help.  Exercise regularly. Start any new physical activity gradually. Contact a health care provider if:  Your bursitis is not responding to treatment or home care.  You have  a fever.  You have chills. This information is not intended to replace advice given to you by your health care provider. Make sure you discuss any questions you have with your health care provider. Document Released: 06/11/2000 Document Revised: 11/20/2015 Document Reviewed: 09/03/2013 Elsevier Interactive Patient Education  2018 [employer]  

## 2017-01-27 NOTE — Progress Notes (Signed)
Subjective:  Patient ID: Shawn Brooks, male    DOB: 06/10/1976  Age: 41 y.o. MRN: [id number]  CC: Leg Pain (bilateral)   HPI Markevious ADONAY SCHEIER presents for  arthralgias and myalgias for which has been present for 6 months. Reports history of prior symptoms 10 years ago. Pain is located in both hip(s), is described as sharp and stabbing, and is intermittent . He reports pain in hip worse in left than right. Associated symptoms include: decreased range of motion and instability. Recent history of vascular arterial ultrasound which came out negative. The patient has tried nothing for pain relief.  Related to injury:  No. He reports fluctuation in weight. He reports weight him himself daily reports weight himself earlier this week and was 231 then weighed himself again a few days later and was 222.  He denies any hematochezia, melena, poor appetite, swelling of the extremities, or activity intolerance. He does report trying to make healthier eating choices.He is ready to quit smoking at this time and is interested in pill options.    Outpatient Medications Prior to Visit  Medication Sig Dispense Refill  . diclofenac sodium (VOLTAREN) 1 % GEL Apply 2 g topically 4 (four) times daily. (Patient not taking: Reported on 12/30/2016) 100 g 2  . gabapentin (NEURONTIN) 300 MG capsule Take 1 capsule (300 mg total) by mouth 3 (three) times daily. 90 capsule 2  . GENVOYA 150-150-200-10 MG TABS tablet TAKE 1 TABLET BY MOUTH EVERY DAY WITH PREZISTA 30 tablet 5  . pravastatin (PRAVACHOL) 40 MG tablet Take 1 tablet (40 mg total) by mouth daily. 30 tablet 2  . PREZISTA 800 MG tablet TAKE 1 TABLET BY MOUTH EVERY DAY 30 tablet 5  . ranitidine (ZANTAC) 150 MG tablet Take 1-2 tablets by mouth twice daily 120 tablet 5  . sildenafil (VIAGRA) 50 MG tablet Take 1 tablet (50 mg total) by mouth daily as needed for erectile dysfunction. 30 tablet 3  . tadalafil (CIALIS) 20 MG tablet Take 1 tablet (20 mg total) by mouth daily as  needed for erectile dysfunction. 30 tablet 3  . traZODone (DESYREL) 100 MG tablet Take 300 mg by mouth at bedtime.   2  . valACYclovir (VALTREX) 500 MG tablet TAKE 1 TABLET BY MOUTH TWICE DAILY AS NEEDED FOR FLARE UPS 60 tablet 5  . cyclobenzaprine (FLEXERIL) 10 MG tablet Take 1 tablet (10 mg total) by mouth 3 (three) times daily as needed for muscle spasms. 30 tablet 0  . ibuprofen (ADVIL,MOTRIN) 800 MG tablet Take 1 tablet (800 mg total) by mouth every 8 (eight) hours as needed (Take with food.). 40 tablet 0   Facility-Administered Medications Prior to Visit  Medication Dose Route Frequency Provider Last Rate Last Dose  . 0.9 %  sodium chloride infusion  500 mL Intravenous Continuous Pyrtle, Lajuan Lines, MD        ROS Review of Systems  Constitutional: Negative.   Respiratory: Negative.   Cardiovascular: Negative.   Gastrointestinal: Negative.   Musculoskeletal: Positive for arthralgias and myalgias.  Skin: Negative.   Psychiatric/Behavioral: Negative for suicidal ideas.   Objective:  BP 96/63 (BP Location: Left Arm, Patient Position: Sitting, Cuff Size: Normal)   Pulse 63   Temp 97.9 F (36.6 C) (Oral)   Resp 18   Ht 5\' 8"  (1.727 m)   Wt 227 lb (103 kg)   SpO2 97%   BMI 34.52 kg/m   BP/Weight 01/27/2017 12/30/2016 7/78/2423  Systolic BP 96 536 144  Diastolic BP 63 84 71  Wt. (Lbs) 227 235 235.4  BMI 34.52 35.73 35.79   Physical Exam  Eyes: Pupils are equal, round, and reactive to light. Conjunctivae are normal.  Neck: No JVD present.  Cardiovascular: Normal rate, regular rhythm, normal heart sounds and intact distal pulses.   Pulmonary/Chest: Effort normal and breath sounds normal.  Abdominal: Soft. Bowel sounds are normal.  Musculoskeletal:       Right hip: He exhibits decreased range of motion (with abduction/ adduction of the hip). He exhibits no swelling.       Left hip: He exhibits decreased range of motion (with abduction/ adduction of the hip).  bilateral hip pain with  abduction/adduction of the hip.  Skin: Skin is warm and dry.  Nursing note and vitals reviewed.   Assessment & Plan:   Problem List Items Addressed This Visit    None    Visit Diagnoses    Trochanteric bursitis of both hips    -  Primary   Relevant Medications   cyclobenzaprine (FLEXERIL) 10 MG tablet   ibuprofen (ADVIL,MOTRIN) 800 MG tablet   Fluctuation of weight       235.4 lbs at previous visit compared to 227 lbs today.    Suspect fluctuation of weight may be due to eating better, and how he weighs himself at home.   Pt.encouraged to weigh himself once a week with empty bladder.   Relevant Orders   TSH   Ready to quit smoking       Relevant Medications   buPROPion (WELLBUTRIN SR) 150 MG 12 hr tablet      Meds ordered this encounter  Medications  . cyclobenzaprine (FLEXERIL) 10 MG tablet    Sig: Take 1 tablet (10 mg total) by mouth 3 (three) times daily as needed for muscle spasms.    Dispense:  30 tablet    Refill:  0    Order Specific Question:   Supervising Provider    Answer:   Tresa Garter [id number]  . ibuprofen (ADVIL,MOTRIN) 800 MG tablet    Sig: Take 1 tablet (800 mg total) by mouth every 8 (eight) hours as needed (Take with food.).    Dispense:  40 tablet    Refill:  0    Order Specific Question:   Supervising Provider    Answer:   Tresa Garter [id number]  . buPROPion (WELLBUTRIN SR) 150 MG 12 hr tablet    Sig: Take 1 tablet (150 mg total) by mouth 2 (two) times daily.    Dispense:  60 tablet    Refill:  3    Order Specific Question:   Supervising Provider    Answer:   Tresa Garter [id number]    Follow-up: Return if symptoms worsen or fail to improve.   Alfonse Spruce FNP

## 2017-01-28 LAB — TSH: TSH: 1.08 u[IU]/mL (ref 0.450–4.500)

## 2017-02-03 ENCOUNTER — Encounter: Payer: Self-pay | Admitting: Internal Medicine

## 2017-02-04 ENCOUNTER — Telehealth: Payer: Self-pay

## 2017-02-04 NOTE — Telephone Encounter (Signed)
CMA call regarding lab results   Patient did not answer but left a VM stating the reason of the call & to call back  

## 2017-02-04 NOTE — Telephone Encounter (Signed)
-----   Message from Alfonse Spruce, [place] sent at 02/03/2017  6:47 PM EDT ----- Thyroid function normal. When thyroid is underfunctioning it can cause weight gain.

## 2017-02-23 MED FILL — $Viagra 50mg tablet: 50 | 30 days supply | Qty: 10 | Fill #1

## 2017-03-01 ENCOUNTER — Other Ambulatory Visit: Payer: Self-pay | Admitting: Family Medicine

## 2017-03-01 DIAGNOSIS — M7062 Trochanteric bursitis, left hip: Principal | ICD-10-CM

## 2017-03-01 DIAGNOSIS — M7061 Trochanteric bursitis, right hip: Secondary | ICD-10-CM

## 2017-03-03 MED FILL — ?CYCLOBENZAPRINE 10 MG TABL: 10 | 10 days supply | Qty: 30 | Fill #0

## 2017-03-03 MED FILL — IBUPROFEN 800 MG TABLET: 800 | 14 days supply | Qty: 40 | Fill #0

## 2017-03-03 NOTE — Telephone Encounter (Signed)
Pt. Called requesting a refill on the following medications:   cyclobenzaprine (FLEXERIL) 10 MG tablet  ibuprofen (ADVIL,MOTRIN) 800 MG tablet   Pt. Would like Rx sent to [facility] pharmacy. Please f/u

## 2017-03-04 ENCOUNTER — Other Ambulatory Visit: Payer: Self-pay | Admitting: Internal Medicine

## 2017-03-04 DIAGNOSIS — B20 Human immunodeficiency virus [HIV] disease: Secondary | ICD-10-CM

## 2017-03-22 ENCOUNTER — Encounter: Payer: Self-pay | Admitting: Psychology

## 2017-03-25 ENCOUNTER — Ambulatory Visit: Payer: Self-pay | Admitting: Family Medicine

## 2017-03-30 ENCOUNTER — Ambulatory Visit: Payer: Self-pay | Admitting: Family Medicine

## 2017-04-01 ENCOUNTER — Other Ambulatory Visit: Payer: Self-pay | Admitting: Family Medicine

## 2017-04-01 ENCOUNTER — Telehealth: Payer: Self-pay | Admitting: Family Medicine

## 2017-04-01 DIAGNOSIS — G8929 Other chronic pain: Secondary | ICD-10-CM

## 2017-04-01 DIAGNOSIS — M25551 Pain in right hip: Principal | ICD-10-CM

## 2017-04-01 DIAGNOSIS — M7062 Trochanteric bursitis, left hip: Principal | ICD-10-CM

## 2017-04-01 DIAGNOSIS — M25552 Pain in left hip: Principal | ICD-10-CM

## 2017-04-01 DIAGNOSIS — M7061 Trochanteric bursitis, right hip: Secondary | ICD-10-CM

## 2017-04-01 MED ORDER — IBUPROFEN 800 MG PO TABS: ORAL_TABLET | ORAL | 0 refills | Status: DC

## 2017-04-01 MED FILL — IBUPROFEN 800 MG TABLET: 800 | 13 days supply | Qty: 40 | Fill #0

## 2017-04-01 NOTE — Telephone Encounter (Signed)
Will forward to PCP 

## 2017-04-01 NOTE — Telephone Encounter (Signed)
Patient called asking for a refill on iburprofen 800 mg

## 2017-04-01 NOTE — Telephone Encounter (Signed)
CMA call regarding medication refill sent to pharmacy   Patient Verify DOB   Patient was aware and understood

## 2017-04-01 NOTE — Telephone Encounter (Signed)
Medication refilled. Take with meals. Prolonged use of high dose ibuprofen can increase risk of stomach ulcer.   Recommend decreasing dose of ibuprofen or discontinuing and placing on another medication for pain in the future.

## 2017-04-04 MED FILL — $Viagra 50mg tablet: 50 | 30 days supply | Qty: 10 | Fill #2

## 2017-04-12 ENCOUNTER — Encounter: Payer: Self-pay | Admitting: Psychology

## 2017-04-18 ENCOUNTER — Other Ambulatory Visit: Payer: Self-pay | Admitting: Family Medicine

## 2017-04-18 DIAGNOSIS — E782 Mixed hyperlipidemia: Secondary | ICD-10-CM

## 2017-05-02 ENCOUNTER — Encounter: Payer: Self-pay | Admitting: Family Medicine

## 2017-05-02 ENCOUNTER — Ambulatory Visit: Payer: Self-pay | Attending: Family Medicine | Admitting: Internal Medicine

## 2017-05-02 VITALS — BP 117/72 | HR 74 | Temp 98.6°F | Resp 18 | Ht 68.0 in | Wt 219.6 lb

## 2017-05-02 DIAGNOSIS — I129 Hypertensive chronic kidney disease with stage 1 through stage 4 chronic kidney disease, or unspecified chronic kidney disease: Secondary | ICD-10-CM | POA: Insufficient documentation

## 2017-05-02 DIAGNOSIS — E1122 Type 2 diabetes mellitus with diabetic chronic kidney disease: Secondary | ICD-10-CM | POA: Insufficient documentation

## 2017-05-02 DIAGNOSIS — Z8719 Personal history of other diseases of the digestive system: Secondary | ICD-10-CM | POA: Insufficient documentation

## 2017-05-02 DIAGNOSIS — F419 Anxiety disorder, unspecified: Secondary | ICD-10-CM | POA: Insufficient documentation

## 2017-05-02 DIAGNOSIS — N183 Chronic kidney disease, stage 3 unspecified: Secondary | ICD-10-CM | POA: Insufficient documentation

## 2017-05-02 DIAGNOSIS — M170 Bilateral primary osteoarthritis of knee: Secondary | ICD-10-CM | POA: Insufficient documentation

## 2017-05-02 DIAGNOSIS — G8929 Other chronic pain: Secondary | ICD-10-CM | POA: Insufficient documentation

## 2017-05-02 DIAGNOSIS — M545 Low back pain: Secondary | ICD-10-CM | POA: Insufficient documentation

## 2017-05-02 DIAGNOSIS — Z79899 Other long term (current) drug therapy: Secondary | ICD-10-CM | POA: Insufficient documentation

## 2017-05-02 DIAGNOSIS — B20 Human immunodeficiency virus [HIV] disease: Secondary | ICD-10-CM | POA: Insufficient documentation

## 2017-05-02 DIAGNOSIS — R7303 Prediabetes: Secondary | ICD-10-CM

## 2017-05-02 DIAGNOSIS — M1712 Unilateral primary osteoarthritis, left knee: Secondary | ICD-10-CM

## 2017-05-02 DIAGNOSIS — Z23 Encounter for immunization: Secondary | ICD-10-CM | POA: Insufficient documentation

## 2017-05-02 LAB — POCT GLYCOSYLATED HEMOGLOBIN (HGB A1C): Hemoglobin A1C: 5.6

## 2017-05-02 LAB — GLUCOSE, POCT (MANUAL RESULT ENTRY): POC GLUCOSE: 93 mg/dL (ref 70–99)

## 2017-05-02 MED ORDER — ACETAMINOPHEN-CODEINE #3 300-30 MG PO TABS: 1.0000 | ORAL_TABLET | ORAL | 0 refills | Status: DC | PRN

## 2017-05-02 MED ORDER — DICLOFENAC SODIUM 1 % TD GEL: 2.0000 g | Freq: Four times a day (QID) | TRANSDERMAL | 1 refills | Status: DC

## 2017-05-02 NOTE — Progress Notes (Signed)
Shawn Brooks, is a 41 y.o. male  OTL:[id number]  HRC:[id number]  DOB - Jul 21, 1975  Chief Complaint  Patient presents with  . Diabetes      Subjective:   Shawn Brooks is a 41 y.o. male with history of HIV disease, chronic low back pain, OA of the knee joints, major depression and anxiety who presents here today for a follow up visit. Major complaint today is left knee and chin pain especially after activities. No recent injury or fall but he has had diagnosis of tendinitis in the past. No swelling or redness. No fever. Patient claims improvement in his depression and anxiety, sleeps better with Trazodone. Denies suicidal ideation or thoughts. He has been taking Ibuprofen 800 mg daily and sometimes in addition to aleve, for pain. He knows this may be hurting his kidneys but he is in so much pain that he has to take something for it. He doe not have insurance to visit specialist and he is not able to afford any bill at the moment. He is adherent with his HIV medications but wonder if they're causing his CKD. Patient has No chest pain, No abdominal pain - No Nausea, No new weakness tingling or numbness, No Cough - SOB.  Problem  Primary Osteoarthritis of Left Knee  Needs Flu Shot  Stage 3 Chronic Kidney Disease (Hcc)    ALLERGIES: No Known Allergies  PAST MEDICAL HISTORY: Past Medical History:  Diagnosis Date  . Anxiety   . Blood dyscrasia    HIV  . Chronic back pain   . Depression   . DJD (degenerative joint disease)   . Gastric ulcer   . Headache(784.0)   . HIV infection ([place])   . Hyperplastic colon polyp   . Hypertension     MEDICATIONS AT HOME: Prior to Admission medications   Medication Sig Start Date End Date Taking? Authorizing Provider  acetaminophen-codeine (TYLENOL #3) 300-30 MG tablet Take 1 tablet every 4 (four) hours as needed by mouth. 05/02/17   Tresa Garter, MD  buPROPion (WELLBUTRIN SR) 150 MG 12 hr tablet Take 1 tablet (150 mg total) by mouth 2  (two) times daily. 01/27/17   Alfonse Spruce, FNP  cyclobenzaprine (FLEXERIL) 10 MG tablet TAKE 1 TABLET BY MOUTH 3 TIMES DAILY AS NEEDED FOR MUSCLE SPASMS. 03/03/17   Fredia Beets R, FNP  diclofenac sodium (VOLTAREN) 1 % GEL Apply 2 g 4 (four) times daily topically. 05/02/17   Tresa Garter, MD  gabapentin (NEURONTIN) 300 MG capsule Take 1 capsule (300 mg total) by mouth 3 (three) times daily. 11/17/16   Maren Reamer, MD  GENVOYA 150-150-200-10 MG TABS tablet TAKE 1 TABLET BY MOUTH EVERY DAY WITH PREZISTA 03/04/17   Thayer Headings, MD  pravastatin (PRAVACHOL) 40 MG tablet TAKE 1 TABLET(40 MG) BY MOUTH DAILY 04/18/17   Tresa Garter, MD  PREZISTA 800 MG tablet TAKE 1 TABLET BY MOUTH EVERY DAY 03/04/17   Comer, Okey Regal, MD  ranitidine (ZANTAC) 150 MG tablet Take 1-2 tablets by mouth twice daily 11/04/16   Pyrtle, Lajuan Lines, MD  sildenafil (VIAGRA) 50 MG tablet Take 1 tablet (50 mg total) by mouth daily as needed for erectile dysfunction. 01/03/17   Tresa Garter, MD  traZODone (DESYREL) 100 MG tablet Take 300 mg by mouth at bedtime.  07/24/14   [provider]  valACYclovir (VALTREX) 500 MG tablet TAKE 1 TABLET BY MOUTH TWICE DAILY AS NEEDED FOR FLARE UPS 09/20/16   Comer,  Okey Regal, MD    Objective:   Vitals:   05/02/17 0947  BP: 117/72  Pulse: 74  Resp: 18  Temp: 98.6 F (37 C)  TempSrc: Oral  SpO2: 97%  Weight: 219 lb 9.6 oz (99.6 kg)  Height: _0  (1.727 m)   Exam General appearance : Awake, alert, not in any distress. Speech Clear. Not toxic looking HEENT: Atraumatic and Normocephalic, pupils equally reactive to light and accomodation Neck: Supple, no JVD. No cervical lymphadenopathy.  Chest: Good air entry bilaterally, no added sounds  CVS: S1 S2 regular, no murmurs.  Abdomen: Bowel sounds present, Non tender and not distended with no gaurding, rigidity or rebound. Extremities: B/L Lower Ext shows no edema, both legs are warm to touch Neurology:  Awake alert, and oriented X 3, CN II-XII intact, Non focal Skin: No Rash  Data Review Lab Results  Component Value Date   HGBA1C 5.6 05/02/2017   HGBA1C 5.8 11/17/2016   HGBA1C 5.7 (H) 11/16/2013    Assessment & Plan   1. Prediabetes  - Glucose (CBG) - HgB A1c  2. Needs flu shot  - Flu Vaccine QUAD 6+ mos PF IM (Fluarix Quad PF): Given today  3. Primary osteoarthritis of left knee - No NSAIDs because of CKD - Trial of Tylenol #3 - Voltaren Gel local application - If no improvement, will refer to Sport Medicine for possible intra-articular injection  4. Stage 3 chronic kidney disease (HCC)  - CMP14+EGFR - VITAMIN D 25 Hydroxy (Vit-D Deficiency, Fractures) - Magnesium - Phosphorus  Patient have been counseled extensively about nutrition and exercise. Other issues discussed during this visit include: low cholesterol diet, weight control and daily exercise, importance of adherence with medications and regular follow-up.   Return in about 3 months (around 08/02/2017) for Follow up Pain and comorbidities.  The patient was given clear instructions to go to ER or return to medical center if symptoms don't improve, worsen or new problems develop. The patient verbalized understanding. The patient was told to call to get lab results if they haven't heard anything in the next week.   This note has been created with Surveyor, quantity. Any transcriptional errors are unintentional.    Angelica Chessman, MD, [place], Karilyn Cota, [place] and [place], [place]   05/02/2017, 10:46 AM

## 2017-05-02 NOTE — Patient Instructions (Signed)
Osteoarthritis Osteoarthritis is a type of arthritis that affects tissue that covers the ends of bones in joints (cartilage). Cartilage acts as a cushion between the bones and helps them move smoothly. Osteoarthritis results when cartilage in the joints gets worn down. Osteoarthritis is sometimes called "wear and tear" arthritis. Osteoarthritis is the most common form of arthritis. It often occurs in older people. It is a condition that gets worse over time (a progressive condition). Joints that are most often affected by this condition are in:  Fingers.  Toes.  Hips.  Knees.  Spine, including neck and lower back.  What are the causes? This condition is caused by age-related wearing down of cartilage that covers the ends of bones. What increases the risk? The following factors may make you more likely to develop this condition:  Older age.  Being overweight or obese.  Overuse of joints, such as in athletes.  Past injury of a joint.  Past surgery on a joint.  Family history of osteoarthritis.  What are the signs or symptoms? The main symptoms of this condition are pain, swelling, and stiffness in the joint. The joint may lose its shape over time. Small pieces of bone or cartilage may break off and float inside of the joint, which may cause more pain and damage to the joint. Small deposits of bone (osteophytes) may grow on the edges of the joint. Other symptoms may include:  A grating or scraping feeling inside the joint when you move it.  Popping or creaking sounds when you move.  Symptoms may affect one or more joints. Osteoarthritis in a major joint, such as your knee or hip, can make it painful to walk or exercise. If you have osteoarthritis in your hands, you might not be able to grip items, twist your hand, or control small movements of your hands and fingers (fine motor skills). How is this diagnosed? This condition may be diagnosed based on:  Your medical history.  A  physical exam.  Your symptoms.  X-rays of the affected joint(s).  Blood tests to rule out other types of arthritis.  How is this treated? There is no cure for this condition, but treatment can help to control pain and improve joint function. Treatment plans may include:  A prescribed exercise program that allows for rest and joint relief. You may work with a physical therapist.  A weight control plan.  Pain relief techniques, such as: ? Applying heat and cold to the joint. ? Electric pulses delivered to nerve endings under the skin (transcutaneous electrical nerve stimulation, or TENS). ? Massage. ? Certain nutritional supplements.  NSAIDs or prescription medicines to help relieve pain.  Medicine to help relieve pain and inflammation (corticosteroids). This can be given by mouth (orally) or as an injection.  Assistive devices, such as a brace, wrap, splint, specialized glove, or cane.  Surgery, such as: ? An osteotomy. This is done to reposition the bones and relieve pain or to remove loose pieces of bone and cartilage. ? Joint replacement surgery. You may need this surgery if you have very bad (advanced) osteoarthritis.  Follow these instructions at home: Activity  Rest your affected joints as directed by your health care provider.  Do not drive or use heavy machinery while taking prescription pain medicine.  Exercise as directed. Your health care provider or physical therapist may recommend specific types of exercise, such as: ? Strengthening exercises. These are done to strengthen the muscles that support joints that are affected by arthritis.   They can be performed with weights or with exercise bands to add resistance. ? Aerobic activities. These are exercises, such as brisk walking or water aerobics, that get your heart pumping. ? Range-of-motion activities. These keep your joints easy to move. ? Balance and agility exercises. Managing pain, stiffness, and  swelling  If directed, apply heat to the affected area as often as told by your health care provider. Use the heat source that your health care provider recommends, such as a moist heat pack or a heating pad. ? If you have a removable assistive device, remove it as told by your health care provider. ? Place a towel between your skin and the heat source. If your health care provider tells you to keep the assistive device on while you apply heat, place a towel between the assistive device and the heat source. ? Leave the heat on for 20-30 minutes. ? Remove the heat if your skin turns bright red. This is especially important if you are unable to feel pain, heat, or cold. You may have a greater risk of getting burned.  If directed, put ice on the affected joint: ? If you have a removable assistive device, remove it as told by your health care provider. ? Put ice in a plastic bag. ? Place a towel between your skin and the bag. If your health care provider tells you to keep the assistive device on during icing, place a towel between the assistive device and the bag. ? Leave the ice on for 20 minutes, 2-3 times a day. General instructions  Take over-the-counter and prescription medicines only as told by your health care provider.  Maintain a healthy weight. Follow instructions from your health care provider for weight control. These may include dietary restrictions.  Do not use any products that contain nicotine or tobacco, such as cigarettes and e-cigarettes. These can delay bone healing. If you need help quitting, ask your health care provider.  Use assistive devices as directed by your health care provider.  Keep all follow-up visits as told by your health care provider. This is important. Where to find more information:  [employer] of Arthritis and Musculoskeletal and Skin Diseases: www.niams.SouthExposed.es  [employer] on Aging: http://kim-miller.com/  American College of Rheumatology:  www.rheumatology.org Contact a health care provider if:  Your skin turns red.  You develop a rash.  You have pain that gets worse.  You have a fever along with joint or muscle aches. Get help right away if:  You lose a lot of weight.  You suddenly lose your appetite.  You have night sweats. Summary  Osteoarthritis is a type of arthritis that affects tissue covering the ends of bones in joints (cartilage).  This condition is caused by age-related wearing down of cartilage that covers the ends of bones.  The main symptom of this condition is pain, swelling, and stiffness in the joint.  There is no cure for this condition, but treatment can help to control pain and improve joint function. This information is not intended to replace advice given to you by your health care provider. Make sure you discuss any questions you have with your health care provider. Document Released: 06/14/2005 Document Revised: 02/16/2016 Document Reviewed: 02/16/2016 Elsevier Interactive Patient Education  2018 [place]. Chronic Kidney Disease, Adult Chronic kidney disease (CKD) happens when the kidneys are damaged during a time of 3 or more months. The kidneys are two organs that do many important jobs in the body. These jobs include:  Removing wastes and extra fluids from the blood.  Making hormones that maintain the amount of fluid in your tissues and blood vessels.  Making sure that the body has the right amount of fluids and chemicals.  Most of the time, this condition does not go away, but it can usually be controlled. Steps must be taken to slow down the kidney damage or stop it from getting worse. Otherwise, the kidneys may stop working. Follow these instructions at home:  Follow your diet as told by your doctor. You may need to avoid alcohol, salty foods (sodium), and foods that are high in potassium, calcium, and protein.  Take over-the-counter and prescription medicines only as told  by your doctor. Do not take any new medicines unless your doctor says you can do that. These include vitamins and minerals. ? Medicines and nutritional supplements can make kidney damage worse. ? Your doctor may need to change how much medicine you take.  Do not use any tobacco products. These include cigarettes, chewing tobacco, and e-cigarettes. If you need help quitting, ask your doctor.  Keep all follow-up visits as told by your doctor. This is important.  Check your blood pressure. Tell your doctor if there are changes to your blood pressure.  Get to a healthy weight. Stay at that weight. If you need help with this, ask your doctor.  Start or continue an exercise plan. Try to exercise at least 30 minutes a day, 5 days a week.  Stay up-to-date with your shots (immunizations) as told by your doctor. Contact a doctor if:  Your symptoms get worse.  You have new symptoms. Get help right away if:  You have symptoms of end-stage kidney disease. These include: ? Headaches. ? Skin that is darker or lighter than normal. ? Numbness in your hands or feet. ? Easy bruising. ? Having hiccups often. ? Chest pain. ? Shortness of breath. ? Stopping of menstrual periods in women.  You have a fever.  You are making very little pee (urine).  You have pain or bleeding when you pee (urinate). This information is not intended to replace advice given to you by your health care provider. Make sure you discuss any questions you have with your health care provider. Document Released: 09/08/2009 Document Revised: 11/20/2015 Document Reviewed: 02/11/2012 Elsevier Interactive Patient Education  2017 [employer].

## 2017-05-03 LAB — CMP14+EGFR
ALBUMIN: 4.5 g/dL (ref 3.5–5.5)
ALK PHOS: 88 IU/L (ref 39–117)
ALT: 14 IU/L (ref 0–44)
AST: 23 IU/L (ref 0–40)
Albumin/Globulin Ratio: 1.6 (ref 1.2–2.2)
BILIRUBIN TOTAL: 0.3 mg/dL (ref 0.0–1.2)
BUN / CREAT RATIO: 9 (ref 9–20)
BUN: 12 mg/dL (ref 6–24)
CHLORIDE: 103 mmol/L (ref 96–106)
CO2: 24 mmol/L (ref 20–29)
CREATININE: 1.38 mg/dL — AB (ref 0.76–1.27)
Calcium: 9.2 mg/dL (ref 8.7–10.2)
GFR calc Af Amer: 72 mL/min/{1.73_m2} (ref >59)
GFR calc non Af Amer: 62 mL/min/{1.73_m2} (ref >59)
GLUCOSE: 85 mg/dL (ref 65–99)
Globulin, Total: 2.8 g/dL (ref 1.5–4.5)
Potassium: 4.7 mmol/L (ref 3.5–5.2)
SODIUM: 142 mmol/L (ref 134–144)
Total Protein: 7.3 g/dL (ref 6.0–8.5)

## 2017-05-03 LAB — VITAMIN D 25 HYDROXY (VIT D DEFICIENCY, FRACTURES): VIT D 25 HYDROXY: 26.1 ng/mL — AB (ref 30.0–100.0)

## 2017-05-03 LAB — MAGNESIUM: MAGNESIUM: 2.1 mg/dL (ref 1.6–2.3)

## 2017-05-03 LAB — PHOSPHORUS: Phosphorus: 3 mg/dL (ref 2.5–4.5)

## 2017-05-06 MED FILL — $Viagra 50mg tablet: 50 | 30 days supply | Qty: 10 | Fill #3

## 2017-05-09 ENCOUNTER — Ambulatory Visit: Payer: Self-pay

## 2017-05-12 ENCOUNTER — Telehealth: Payer: Self-pay | Admitting: Family Medicine

## 2017-05-12 NOTE — Telephone Encounter (Signed)
Pt called to request a refill on traMADol. Pt informed that he was prescribed this medication at a different facility out of the [facility] health system. Please follow up.

## 2017-05-16 ENCOUNTER — Other Ambulatory Visit: Payer: Self-pay | Admitting: Internal Medicine

## 2017-05-16 NOTE — Telephone Encounter (Signed)
Patient was placed on Tylenol #3 and not tramadol. Please confirm which is patient's preference.

## 2017-05-22 ENCOUNTER — Encounter: Payer: Self-pay | Admitting: Internal Medicine

## 2017-05-27 ENCOUNTER — Telehealth: Payer: Self-pay | Admitting: *Deleted

## 2017-05-27 NOTE — Telephone Encounter (Signed)
Patient verified DOB Patient is aware of vitamin d level being low and needing to begin daily supplements OTC. Patient is also aware of kidney function being stable and all other labs being within normal limits. No further questions.

## 2017-05-27 NOTE — Telephone Encounter (Signed)
-----   Message from Tresa Garter, MD sent at 05/15/2017  9:47 AM EST ----- Please inform patient that his kidney function is stable. Vitamin D level is slightly low, other labs are within normal limits. You may take Vit D supplement over the counter daily. No change in medications needed at this time.

## 2017-06-03 ENCOUNTER — Other Ambulatory Visit: Payer: Self-pay | Admitting: Internal Medicine

## 2017-06-03 DIAGNOSIS — B20 Human immunodeficiency virus [HIV] disease: Secondary | ICD-10-CM

## 2017-06-09 MED FILL — $Viagra 50mg tablet: 50 | 30 days supply | Qty: 10 | Fill #4

## 2017-06-10 ENCOUNTER — Other Ambulatory Visit: Payer: Self-pay | Admitting: Family Medicine

## 2017-06-10 DIAGNOSIS — M7062 Trochanteric bursitis, left hip: Principal | ICD-10-CM

## 2017-06-10 DIAGNOSIS — M7061 Trochanteric bursitis, right hip: Secondary | ICD-10-CM

## 2017-06-14 ENCOUNTER — Other Ambulatory Visit: Payer: Self-pay

## 2017-06-15 ENCOUNTER — Other Ambulatory Visit: Payer: Self-pay

## 2017-06-15 DIAGNOSIS — B20 Human immunodeficiency virus [HIV] disease: Secondary | ICD-10-CM

## 2017-06-16 LAB — T-HELPER CELL (CD4) - (RCID CLINIC ONLY)
CD4 % Helper T Cell: 27 % — ABNORMAL LOW (ref 33–55)
CD4 T Cell Abs: 1010 /uL (ref 400–2700)

## 2017-06-17 LAB — HIV-1 RNA QUANT-NO REFLEX-BLD
HIV 1 RNA Quant: <20 copies/mL — AB
HIV-1 RNA QUANT, LOG: DETECTED {Log_copies}/mL — AB

## 2017-06-28 HISTORY — PX: COLONOSCOPY: SHX174

## 2017-06-29 ENCOUNTER — Other Ambulatory Visit: Payer: Self-pay | Admitting: Internal Medicine

## 2017-06-29 DIAGNOSIS — B20 Human immunodeficiency virus [HIV] disease: Secondary | ICD-10-CM

## 2017-06-29 DIAGNOSIS — E782 Mixed hyperlipidemia: Secondary | ICD-10-CM

## 2017-06-30 ENCOUNTER — Telehealth: Payer: Self-pay | Admitting: Family Medicine

## 2017-06-30 NOTE — Telephone Encounter (Signed)
Pt called and has questions in regards to his kidney

## 2017-06-30 NOTE — Telephone Encounter (Signed)
Patient would like to discuss kidney concerns. Please follow up with patient.

## 2017-07-01 ENCOUNTER — Telehealth: Payer: Self-pay | Admitting: Family Medicine

## 2017-07-01 ENCOUNTER — Other Ambulatory Visit: Payer: Self-pay | Admitting: Family Medicine

## 2017-07-01 NOTE — Telephone Encounter (Signed)
Completed.

## 2017-07-01 NOTE — Telephone Encounter (Signed)
Called patient to follow up regarding request. Patient requesting to know date he was diagnosed with CKD 3. Discussed diagnosis and recommendations for management.  During conversation he became increasingly agitated. He reports being told in the past of diagnosis from his previous PCP, Dr.D. Langland. He is requesting date of diagnosis. Date given. Patient hung up abruptly during conversation.

## 2017-07-04 ENCOUNTER — Other Ambulatory Visit: Payer: Self-pay | Admitting: Internal Medicine

## 2017-07-04 DIAGNOSIS — B20 Human immunodeficiency virus [HIV] disease: Secondary | ICD-10-CM

## 2017-07-05 ENCOUNTER — Ambulatory Visit: Payer: Self-pay | Admitting: Internal Medicine

## 2017-07-11 MED FILL — $Viagra 50mg tablet: 50 | 30 days supply | Qty: 10 | Fill #5

## 2017-07-20 ENCOUNTER — Telehealth: Payer: Self-pay | Admitting: Family Medicine

## 2017-07-20 NOTE — Telephone Encounter (Signed)
Patient called requesting to speak with the ma. Regarding The dates MRI's . Please fu with patient.

## 2017-07-25 ENCOUNTER — Other Ambulatory Visit: Payer: Self-pay

## 2017-07-25 ENCOUNTER — Encounter (HOSPITAL_COMMUNITY): Payer: Self-pay | Admitting: Nurse Practitioner

## 2017-07-25 ENCOUNTER — Emergency Department (HOSPITAL_COMMUNITY)
Admission: EM | Admit: 2017-07-25 | Discharge: 2017-07-25 | Disposition: A | Discharge status: Home / Self Care | Payer: Self-pay | Attending: Emergency Medicine | Admitting: Emergency Medicine

## 2017-07-25 ENCOUNTER — Emergency Department (HOSPITAL_COMMUNITY): Payer: Self-pay

## 2017-07-25 DIAGNOSIS — R300 Dysuria: Secondary | ICD-10-CM | POA: Insufficient documentation

## 2017-07-25 DIAGNOSIS — F17210 Nicotine dependence, cigarettes, uncomplicated: Secondary | ICD-10-CM | POA: Insufficient documentation

## 2017-07-25 DIAGNOSIS — N183 Chronic kidney disease, stage 3 (moderate): Secondary | ICD-10-CM | POA: Insufficient documentation

## 2017-07-25 DIAGNOSIS — Z21 Asymptomatic human immunodeficiency virus [HIV] infection status: Secondary | ICD-10-CM | POA: Insufficient documentation

## 2017-07-25 DIAGNOSIS — M79602 Pain in left arm: Secondary | ICD-10-CM | POA: Insufficient documentation

## 2017-07-25 DIAGNOSIS — Z79899 Other long term (current) drug therapy: Secondary | ICD-10-CM | POA: Insufficient documentation

## 2017-07-25 DIAGNOSIS — M5442 Lumbago with sciatica, left side: Secondary | ICD-10-CM | POA: Insufficient documentation

## 2017-07-25 DIAGNOSIS — R202 Paresthesia of skin: Secondary | ICD-10-CM | POA: Insufficient documentation

## 2017-07-25 DIAGNOSIS — I129 Hypertensive chronic kidney disease with stage 1 through stage 4 chronic kidney disease, or unspecified chronic kidney disease: Secondary | ICD-10-CM | POA: Insufficient documentation

## 2017-07-25 LAB — URINALYSIS, ROUTINE W REFLEX MICROSCOPIC
BILIRUBIN URINE: NEGATIVE
GLUCOSE, UA: NEGATIVE mg/dL
HGB URINE DIPSTICK: NEGATIVE
KETONES UR: NEGATIVE mg/dL
Leukocytes, UA: NEGATIVE
Nitrite: NEGATIVE
PH: 7 (ref 5.0–8.0)
Protein, ur: NEGATIVE mg/dL
Specific Gravity, Urine: 1.02 (ref 1.005–1.030)

## 2017-07-25 MED ORDER — METHOCARBAMOL 500 MG PO TABS: 500.0000 mg | ORAL_TABLET | Freq: Three times a day (TID) | ORAL | 0 refills | Status: DC | PRN

## 2017-07-25 MED ORDER — PREDNISONE 10 MG (21) PO TBPK: ORAL_TABLET | ORAL | 0 refills | Status: DC

## 2017-07-25 MED ORDER — PREDNISONE 20 MG PO TABS
60.0000 mg | ORAL_TABLET | Freq: Once | ORAL | Status: AC
  Administered 2017-07-25: 60 mg via ORAL
  Filled 2017-07-25: qty 3

## 2017-07-25 MED ORDER — TRAMADOL HCL 50 MG PO TABS
50.0000 mg | ORAL_TABLET | Freq: Once | ORAL | Status: AC
  Administered 2017-07-25: 50 mg via ORAL
  Filled 2017-07-25: qty 1

## 2017-07-25 NOTE — ED Notes (Signed)
Patient transported to X-ray 

## 2017-07-25 NOTE — ED Triage Notes (Signed)
Pt endorses mid-lower left back pain that has been ongoing since last Wednesday. Pt sts pain overall has eased up with ice/heat and compression but is still having sharp pains. Pt took tramadol from old rx from back surgery 2 days ago with moderate relief afterwards but notes still having back spasms. Pt denies incontinence or paresthesias.

## 2017-07-25 NOTE — ED Provider Notes (Signed)
[facility] Provider Note   CSN: [id number] Arrival date & time: 07/25/17  2119     History   Chief Complaint Chief Complaint  Patient presents with  . Back Pain    HPI Shawn Brooks is a 42 y.o. male with a hx of HIV, DJD, and HTN who presents to the ED complaining of mid to lower left sided back pain that started 6 days ago with some repetitive reaching motions with onset. Patient states pain has been fairly constant since onset. Described the pain as spasms, tightness, and grabbing type pain which starts in the left lower back and radiates down the LLE. Rates the pain a 7/10 in severity at present. States he had some leftover Tramadol which he took with some relief, however pain returned. Has also tried ice, heat, compression, and bengay with some minimal improvement as well. Also started to have some discomfort to LUE starting from shoulder extending to hand, having some paresthesias to the L hand-  States hx of similar- has appointment with PCP for this scheduled 02/04. Denies numbness, weakness, incontinence to bowel/bladder, fever, chills, IV drug use, or hx of cancer. Upon further questioning patient reports dysuria x 2 days, no penile discharge, testicular pain/swelling, or hematuria.  HPI  Past Medical History:  Diagnosis Date  . Anxiety   . Blood dyscrasia    HIV  . Chronic back pain   . Depression   . DJD (degenerative joint disease)   . Gastric ulcer   . Headache(784.0)   . HIV infection ([place])   . Hyperplastic colon polyp   . Hypertension     Patient Active Problem List   Diagnosis Date Noted  . Primary osteoarthritis of left knee 05/02/2017  . Needs flu shot 05/02/2017  . Stage 3 chronic kidney disease ([place]) 05/02/2017  . Prediabetes 12/22/2016  . Diabetes mellitus screening 11/17/2016  . Tobacco abuse 05/31/2016  . Lumbar transverse process fracture ([place]) 10/02/2015  . Screening examination for sexually transmitted  disease 04/04/2014  . Seizures ([place]) 08/30/2013  . Personal history of digestive disease 01/15/2013  . Thoracic or lumbosacral neuritis or radiculitis 01/15/2013  . H/O gastrointestinal disease 01/15/2013  . Lumbar radiculopathy 01/15/2013  . 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 Condyloma acuminatum 11/17/2012  . 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 AGW (anogenital warts) 11/17/2012  . Hemorrhoid 09/27/2012  . BRBPR (bright red blood per rectum) 07/27/2012  . Prostatitis 04/12/2012  . Constipation 03/09/2012  . Callus 08/16/2011  . Ingrown toenail 08/16/2011  . Chronic low back pain 08/16/2011  . Dyslipidemia 09/03/2010  . 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 Erectile dysfunction 07/06/2010  . ANXIETY DEPRESSION 05/06/2010  . BURSITIS, KNEE 05/06/2010  . Human immunodeficiency virus (HIV) disease ([place]) 04/16/2010    Past Surgical History:  Procedure Laterality Date  . ESOPHAGOGASTRODUODENOSCOPY  03/27/2012   Procedure: ESOPHAGOGASTRODUODENOSCOPY (EGD);  Surgeon: Lear Ng, MD;  Location: [facility];  Service: Endoscopy;  Laterality: N/A;  . IRRIGATION AND DEBRIDEMENT ABSCESS N/A 03/13/2015   Procedure: IRRIGATION AND DEBRIDEMENT ABSCESS;  Surgeon: Johnathan Hausen, MD;  Location: [facility];  Service: General;  Laterality: N/A;  . WISDOM TOOTH EXTRACTION         Home Medications    Prior to Admission medications   Medication Sig Start Date End Date Taking? Authorizing Provider  acetaminophen-codeine (TYLENOL #3) 300-30 MG tablet Take 1 tablet every 4 (four) hours as needed by mouth. 05/02/17  Tresa Garter, MD  buPROPion (WELLBUTRIN SR) 150 MG 12 hr tablet Take 1 tablet (150 mg total) by mouth 2 (two) times daily. 01/27/17   Alfonse Spruce, FNP  cyclobenzaprine (FLEXERIL) 10 MG tablet TAKE 1 TABLET BY MOUTH 3 TIMES DAILY AS NEEDED FOR MUSCLE SPASMS. 06/13/17   Alfonse Spruce, FNP  diclofenac sodium (VOLTAREN) 1 % GEL Apply 2 g 4 (four)  times daily topically. 05/02/17   Tresa Garter, MD  gabapentin (NEURONTIN) 300 MG capsule Take 1 capsule (300 mg total) by mouth 3 (three) times daily. 11/17/16   Maren Reamer, MD  GENVOYA 150-150-200-10 MG TABS tablet TAKE 1 TABLET BY MOUTH EVERY DAY WITH PREZISTA 07/04/17   Thayer Headings, MD  pravastatin (PRAVACHOL) 40 MG tablet TAKE 1 TABLET(40 MG) BY MOUTH DAILY 06/29/17   Alfonse Spruce, FNP  PREZISTA 800 MG tablet TAKE 1 TABLET BY MOUTH EVERY DAY 07/04/17   Comer, Okey Regal, MD  ranitidine (ZANTAC) 150 MG tablet Take 1-2 tablets by mouth twice daily 11/04/16   Pyrtle, Lajuan Lines, MD  sildenafil (VIAGRA) 50 MG tablet Take 1 tablet (50 mg total) by mouth daily as needed for erectile dysfunction. 01/03/17   Tresa Garter, MD  traZODone (DESYREL) 100 MG tablet Take 300 mg by mouth at bedtime.  07/24/14   [provider]  valACYclovir (VALTREX) 500 MG tablet TAKE 1 TABLET BY MOUTH TWICE DAILY AS NEEDED FOR FLARE UPS 09/20/16   Comer, Okey Regal, MD    Family History Family History  Problem Relation Age of Onset  . Cancer Mother        laryngeal  . Pneumonia Father   . Diabetes Father   . Hypertension Sister   . Crohn's disease Brother   . Crohn's disease Maternal Grandmother   . Cancer Maternal Grandmother        patient unsure of type  . Colon cancer Maternal Grandfather     Social History Social History   Tobacco Use  . Smoking status: Current Every Day Smoker    Packs/day: 0.50    Types: Cigarettes  . Smokeless tobacco: Never Used  Substance Use Topics  . Alcohol use: Yes    Alcohol/week: 1.2 oz    Types: 2 Standard drinks or equivalent per week    Comment: beer at times. not often  . Drug use: Yes    Frequency: 7.0 times per week    Types: Marijuana    Comment: occ     Allergies   Oxycodone   Review of Systems Review of Systems  Constitutional: Negative for chills and fever.  Respiratory: Negative for shortness of breath.   Cardiovascular:  Negative for chest pain.  Gastrointestinal: Negative for abdominal pain.  Genitourinary: Positive for dysuria (x 2 days) and frequency (chronic unchanged). Negative for discharge, hematuria, penile pain, scrotal swelling and testicular pain.  Musculoskeletal: Positive for arthralgias (L shoulder & elbow), back pain and myalgias (LUE). Negative for neck pain and neck stiffness.  Neurological: Negative for weakness and numbness.       Negative for incontinence to bowel or bladder or saddle anesthesia. Positive for paresthesias to L hand.   Physical Exam Updated Vital Signs BP 121/86 (BP Location: Right Arm)   Pulse 91   Temp 98.3 F (36.8 C) (Oral)   Resp 18   Ht 5\' 8"  (1.727 m)   Wt 95.3 kg (210 lb)   SpO2 99%   BMI 31.93 kg/m   Physical  Exam  Constitutional: He appears well-developed and well-nourished.  Non-toxic appearance. No distress.  HENT:  Head: Normocephalic and atraumatic.  Eyes: Conjunctivae are normal. Right eye exhibits no discharge. Left eye exhibits no discharge.  Neck: Normal range of motion. Neck supple. Muscular tenderness (left extending to trapezius) present. No spinous process tenderness present.  Cardiovascular: Normal rate and regular rhythm.  No murmur heard. Pulses:      Radial pulses are 2+ on the right side, and 2+ on the left side.       Posterior tibial pulses are 2+ on the right side, and 2+ on the left side.  Pulmonary/Chest: Breath sounds normal. No respiratory distress. He has no wheezes. He has no rales.  Abdominal: Soft. He exhibits no distension. There is no tenderness.  Musculoskeletal:  No obvious deformity, appreciable swelling, erythema, warmth, or ecchymosis. Upper Extremities: Patient has full range of motion to bilateral digits, wrists, and elbows.  Full range of motion of the right shoulder, left shoulder flexion is limited secondary to pain.  Patient is diffusely tender to the left upper extremity from shoulder to hand.  There is no  focal or point tenderness.  No point bony tenderness.  Patient's paresthesias are reproduced with Tinel's of the median nerve of LUE.   Back: Diffuse midline lumbar tenderness extending to the left paraspinal muscles.  Patient is significantly more tender in paraspinal muscle region.  There is no focal midline tenderness.  Neurological: He is alert.  Clear speech.  5 out of 5 grip strength bilaterally.  5 out of 5 strength with plantar and dorsiflexion bilaterally.  Sensation is grossly intact bilateral upper and lower extremities.  2+ symmetric patellar DTRs.  Gait is intact.  The patient's pain is reproduced with left lower extremity straight leg raise.  Skin: Skin is warm and dry. No rash noted.  Psychiatric: He has a normal mood and affect. His behavior is normal.  Nursing note and vitals reviewed.  ED Treatments / Results  Labs Results for orders placed or performed during the hospital encounter of 07/25/17  Urinalysis, Routine w reflex microscopic  Result Value Ref Range   Color, Urine YELLOW YELLOW   APPearance CLEAR CLEAR   Specific Gravity, Urine 1.020 1.005 - 1.030   pH 7.0 5.0 - 8.0   Glucose, UA NEGATIVE NEGATIVE mg/dL   Hgb urine dipstick NEGATIVE NEGATIVE   Bilirubin Urine NEGATIVE NEGATIVE   Ketones, ur NEGATIVE NEGATIVE mg/dL   Protein, ur NEGATIVE NEGATIVE mg/dL   Nitrite NEGATIVE NEGATIVE   Leukocytes, UA NEGATIVE NEGATIVE   EKG  EKG Interpretation None       Radiology Dg Lumbar Spine Complete  Result Date: 07/25/2017 CLINICAL DATA:  Low back pain radiating to left hip and leg EXAM: LUMBAR SPINE - COMPLETE 4+ VIEW COMPARISON:  12/22/2016 FINDINGS: Five lumbar-type vertebral bodies. Normal lumbar lordosis. No evidence of fracture or dislocation. Vertebral body heights and intervertebral disc spaces are maintained. Visualized bony pelvis appears intact. IMPRESSION: Negative. Electronically Signed   By: Julian Hy M.D.   On: 07/25/2017 11:50     Procedures Procedures (including critical care time)  Medications Ordered in ED Medications  traMADol (ULTRAM) tablet 50 mg (50 mg Oral Given 07/25/17 1155)  predniSONE (DELTASONE) tablet 60 mg (60 mg Oral Given 07/25/17 1246)    Initial Impression / Assessment and Plan / ED Course  I have reviewed the triage vital signs and the nursing notes.  Pertinent labs & imaging results that were available during  my care of the patient were reviewed by me and considered in my medical decision making (see chart for details).  Patient presents with back pain for the past 2 days as well as left upper extremity pain since this morning.  Patient is nontoxic-appearing, vitals are within normal limits.  On exam patient with diffuse lumbar midline tenderness extending to left lumbar paraspinal muscles, there is no focal midline tenderness.  No neurological deficits and normal neuro exam. Patient can walk but states is painful.  No loss of bowel or bladder control.  No concern for cauda equina.  No fever, night sweats, weight loss, h/o cancer, IVDU.  Given some mild dysuria over the past 2 days as well as diffuse midline tenderness will evaluate with lumbar x-ray as well as a UA.  UA grossly unremarkable, x-ray negative. Pain in LUE is diffuse, no focal tenderness, no hx of injury, no Cspine tenderness, do not think imaging is indicated at this time. Symptoms reproduced with palpation of the left trapezius muscle as well as with Tinel's of the median nerve.  Patient is neurovascularly intact to bilateral upper extremities, no indication of vascular etiology. Possible radiculopathy or carpal tunnel syndrome. Will treat with Prednisone and Robaxin for back and LUE sxs, will avoid NSAIDs in setting of CKD, Discussed patient is not to drive or operate heavy machinery while taking Robaxin. Marland Kitchen Patient provided L wrist splint for possible CTS. I discussed results, treatment plan, need for PCP follow-up, and return  precautions with the patient. Provided opportunity for questions, patient confirmed understanding and is in agreement with plan.   Final Clinical Impressions(s) / ED Diagnoses   Final diagnoses:  Acute left-sided low back pain with left-sided sciatica  Left arm pain    ED Discharge Orders        Ordered    predniSONE (STERAPRED UNI-PAK 21 TAB) 10 MG (21) TBPK tablet     07/25/17 1237    methocarbamol (ROBAXIN) 500 MG tablet  Every 8 hours PRN     07/25/17 1237       Diego Ulbricht, Glynda Jaeger, PA-C 07/25/17 1632    Milton Ferguson, MD 07/30/17 1159

## 2017-07-25 NOTE — ED Notes (Signed)
Pt used call light to notify tech that he requested some pain medication. RN notified.

## 2017-07-25 NOTE — Discharge Instructions (Signed)
We have prescribed you a steroid medication and a muscle relaxer.   Prednisone is the steroid medication- this will help with inflammation and pain.   Robaxin is the muscle relaxer I have prescribed, this is meant to help with muscle tightness. Be aware that this medication may make you drowsy therefore the first time you take this it should be at a time you are in an environment where you can rest. Do not drive or operate heavy machinery when taking this medication.   In addition you may also take Tylenol. Tylenol is generally safe, though you should not take more than 8 of the extra strength (500mg ) pills a day.  The application of heat can help soothe the pain.  Maintaining your daily activities, including walking, is encourged, as it will help you get better faster than just staying in bed.  Your pain should get better over the next 2 weeks.  You will need to follow up with  Your primary healthcare provider as scheduled02/04 if you do not have a primary care provider one is provided in your discharge instructions. However if you develop severe or worsening pain, low back pain with fever, numbness, weakness, loss of bowel or bladder control, or inability to walk or urinate, you should return to the ER immediately.  Please follow up with your doctor this week for a recheck if still having symptoms.

## 2017-08-03 ENCOUNTER — Ambulatory Visit: Payer: Self-pay | Admitting: Internal Medicine

## 2017-08-16 MED FILL — $Viagra 50mg tablet: 50 | 30 days supply | Qty: 10 | Fill #6

## 2017-08-22 ENCOUNTER — Encounter: Payer: Self-pay | Admitting: Internal Medicine

## 2017-09-01 ENCOUNTER — Encounter: Payer: Self-pay | Admitting: Internal Medicine

## 2017-09-01 ENCOUNTER — Telehealth: Payer: Self-pay | Admitting: Neurology

## 2017-09-01 ENCOUNTER — Ambulatory Visit: Payer: Self-pay | Attending: Internal Medicine | Admitting: Internal Medicine

## 2017-09-01 VITALS — BP 108/68 | HR 79 | Temp 98.8°F | Resp 16 | Ht 68.0 in | Wt 222.2 lb

## 2017-09-01 DIAGNOSIS — Z885 Allergy status to narcotic agent status: Secondary | ICD-10-CM | POA: Insufficient documentation

## 2017-09-01 DIAGNOSIS — Z79899 Other long term (current) drug therapy: Secondary | ICD-10-CM | POA: Insufficient documentation

## 2017-09-01 DIAGNOSIS — Z7689 Persons encountering health services in other specified circumstances: Secondary | ICD-10-CM | POA: Insufficient documentation

## 2017-09-01 DIAGNOSIS — F17210 Nicotine dependence, cigarettes, uncomplicated: Secondary | ICD-10-CM | POA: Insufficient documentation

## 2017-09-01 DIAGNOSIS — M79601 Pain in right arm: Secondary | ICD-10-CM | POA: Insufficient documentation

## 2017-09-01 DIAGNOSIS — R05 Cough: Secondary | ICD-10-CM | POA: Insufficient documentation

## 2017-09-01 DIAGNOSIS — E559 Vitamin D deficiency, unspecified: Secondary | ICD-10-CM | POA: Insufficient documentation

## 2017-09-01 DIAGNOSIS — M79671 Pain in right foot: Secondary | ICD-10-CM | POA: Insufficient documentation

## 2017-09-01 DIAGNOSIS — M79604 Pain in right leg: Secondary | ICD-10-CM

## 2017-09-01 DIAGNOSIS — F419 Anxiety disorder, unspecified: Secondary | ICD-10-CM | POA: Insufficient documentation

## 2017-09-01 DIAGNOSIS — M79605 Pain in left leg: Secondary | ICD-10-CM | POA: Insufficient documentation

## 2017-09-01 DIAGNOSIS — F329 Major depressive disorder, single episode, unspecified: Secondary | ICD-10-CM | POA: Insufficient documentation

## 2017-09-01 DIAGNOSIS — M1990 Unspecified osteoarthritis, unspecified site: Secondary | ICD-10-CM | POA: Insufficient documentation

## 2017-09-01 DIAGNOSIS — R059 Cough, unspecified: Secondary | ICD-10-CM

## 2017-09-01 DIAGNOSIS — B20 Human immunodeficiency virus [HIV] disease: Secondary | ICD-10-CM | POA: Insufficient documentation

## 2017-09-01 DIAGNOSIS — E785 Hyperlipidemia, unspecified: Secondary | ICD-10-CM | POA: Insufficient documentation

## 2017-09-01 DIAGNOSIS — R413 Other amnesia: Secondary | ICD-10-CM

## 2017-09-01 DIAGNOSIS — R7303 Prediabetes: Secondary | ICD-10-CM | POA: Insufficient documentation

## 2017-09-01 DIAGNOSIS — M79672 Pain in left foot: Secondary | ICD-10-CM | POA: Insufficient documentation

## 2017-09-01 DIAGNOSIS — I129 Hypertensive chronic kidney disease with stage 1 through stage 4 chronic kidney disease, or unspecified chronic kidney disease: Secondary | ICD-10-CM | POA: Insufficient documentation

## 2017-09-01 DIAGNOSIS — Z21 Asymptomatic human immunodeficiency virus [HIV] infection status: Secondary | ICD-10-CM

## 2017-09-01 DIAGNOSIS — M1712 Unilateral primary osteoarthritis, left knee: Secondary | ICD-10-CM | POA: Insufficient documentation

## 2017-09-01 DIAGNOSIS — M79602 Pain in left arm: Secondary | ICD-10-CM

## 2017-09-01 DIAGNOSIS — N183 Chronic kidney disease, stage 3 (moderate): Secondary | ICD-10-CM | POA: Insufficient documentation

## 2017-09-01 DIAGNOSIS — R300 Dysuria: Secondary | ICD-10-CM

## 2017-09-01 MED ORDER — BENZONATATE 100 MG PO CAPS: 100.0000 mg | ORAL_CAPSULE | Freq: Two times a day (BID) | ORAL | 0 refills | Status: DC | PRN

## 2017-09-01 MED FILL — BENZONATATE 100 MG CAP: 100 | 10 days supply | Qty: 20 | Fill #0

## 2017-09-01 NOTE — Patient Instructions (Signed)
Purchase and take Vitamin D 400 IU take one tablet daily.

## 2017-09-01 NOTE — Telephone Encounter (Signed)
error 

## 2017-09-01 NOTE — Progress Notes (Signed)
Patient ID: Shawn Brooks, male    DOB: [birth year]  MRN: [id number]  CC: re-establish   Subjective: Shawn Brooks is a 42 y.o. male who presents for chronic ds management and to establish with me as PCP.  Previously seen by Dr. Doreene Burke. His concerns today include:  HIV, DJD, HTN, HL, anxiety, tob, CKD stage 2-3, vit D def  Pt up set for long wait time to be seen.  I did apologize for that and thanked him for waiting.  1.  Complains of cough x 3 wks  Sometimes productive of white to yellow phlegm No SOB. Fever and chills initially when first started. + nasal congestion and rhinorrhea also.  No sore throat.  Took Tussion cough med x 2 bottles over the past 3 weeks -+ heart burn several times a wk. On Zantac No drainage in throat  2.  Problems remembering things x several mths.  Sometimes he finds himself saying words out of place or that does not make sense  -worse over past 2 wks. reports a lot of confusion in doing tasks that he had done repeatedly for past yr on his job at [employer].  Mental confusion was worse yesterday -Has to document things in his phone or on a clipboard in order to remember them   -no fhx of dementia -started on mood stabilizer last mth by [facility]; he does not recall the name of the medication but does remember that it begins with an F.  I mentioned fluoxetine and he thinks that may be it.  He does not recall the dose.  He is also on gabapentin and trazodone through [facility] -feels his memory issues started after having a scan last yr where he was given contrast.  States that he has read online where contrast given for scans can affect memory  3.  C/o various pains in legs, feet, arms Pain in legs, in thighs and calf.  He describes the pain as "sharp pains out of no where."  Last for 10-15 sec.  No falls.  4.  HIV: Reportedly missed last appointment with ID.  Has appt later this mth.  Compliant with medications Patient Active Problem List   Diagnosis Date  Noted  . Primary osteoarthritis of left knee 05/02/2017  . Needs flu shot 05/02/2017  . Stage 3 chronic kidney disease ([place]) 05/02/2017  . Prediabetes 12/22/2016  . Diabetes mellitus screening 11/17/2016  . Tobacco abuse 05/31/2016  . Lumbar transverse process fracture ([place]) 10/02/2015  . Screening examination for sexually transmitted disease 04/04/2014  . Seizures ([place]) 08/30/2013  . Personal history of digestive disease 01/15/2013  . Thoracic or lumbosacral neuritis or radiculitis 01/15/2013  . H/O gastrointestinal disease 01/15/2013  . Lumbar radiculopathy 01/15/2013  . 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 Condyloma acuminatum 11/17/2012  . 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 AGW (anogenital warts) 11/17/2012  . Hemorrhoid 09/27/2012  . BRBPR (bright red blood per rectum) 07/27/2012  . Prostatitis 04/12/2012  . Constipation 03/09/2012  . Callus 08/16/2011  . Ingrown toenail 08/16/2011  . Chronic low back pain 08/16/2011  . Dyslipidemia 09/03/2010  . 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 Erectile dysfunction 07/06/2010  . ANXIETY DEPRESSION 05/06/2010  . BURSITIS, KNEE 05/06/2010  . Human immunodeficiency virus (HIV) disease ([place] 04/16/2010     Current Outpatient Medications on File Prior to Visit  Medication Sig Dispense Refill  . acetaminophen-codeine (TYLENOL #3) 300-30 MG tablet Take 1 tablet every 4 (four) hours  as needed by mouth. 60 tablet 0  . cyclobenzaprine (FLEXERIL) 10 MG tablet TAKE 1 TABLET BY MOUTH 3 TIMES DAILY AS NEEDED FOR MUSCLE SPASMS. 30 tablet 0  . diclofenac sodium (VOLTAREN) 1 % GEL Apply 2 g 4 (four) times daily topically. 1 Tube 1  . gabapentin (NEURONTIN) 300 MG capsule Take 1 capsule (300 mg total) by mouth 3 (three) times daily. 90 capsule 2  . GENVOYA 150-150-200-10 MG TABS tablet TAKE 1 TABLET BY MOUTH EVERY DAY WITH PREZISTA 30 tablet 5  . methocarbamol (ROBAXIN) 500 MG tablet Take 1 tablet (500 mg total) by mouth every 8  (eight) hours as needed for muscle spasms. 30 tablet 0  . pravastatin (PRAVACHOL) 40 MG tablet TAKE 1 TABLET(40 MG) BY MOUTH DAILY 90 tablet 0  . predniSONE (STERAPRED UNI-PAK 21 TAB) 10 MG (21) TBPK tablet 6, 5, 4, 3, 2, 1 Take as written 21 tablet 0  . PREZISTA 800 MG tablet TAKE 1 TABLET BY MOUTH EVERY DAY 30 tablet 5  . ranitidine (ZANTAC) 150 MG tablet Take 1-2 tablets by mouth twice daily 120 tablet 5  . sildenafil (VIAGRA) 50 MG tablet Take 1 tablet (50 mg total) by mouth daily as needed for erectile dysfunction. 30 tablet 3  . traZODone (DESYREL) 100 MG tablet Take 300 mg by mouth at bedtime.   2  . valACYclovir (VALTREX) 500 MG tablet TAKE 1 TABLET BY MOUTH TWICE DAILY AS NEEDED FOR FLARE UPS 60 tablet 5   Current Facility-Administered Medications on File Prior to Visit  Medication Dose Route Frequency Provider Last Rate Last Dose  . 0.9 %  sodium chloride infusion  500 mL Intravenous Continuous Pyrtle, Lajuan Lines, MD        Allergies  Allergen Reactions  . Oxycodone Itching    Social History   Socioeconomic History  . Marital status: Divorced    Spouse name: Not on file  . Number of children: 1  . Years of education: Not on file  . Highest education level: Not on file  Social Needs  . Financial resource strain: Not on file  . Food insecurity - worry: Not on file  . Food insecurity - inability: Not on file  . Transportation needs - medical: Not on file  . Transportation needs - non-medical: Not on file  Occupational History  . Occupation: Disabled  Tobacco Use  . Smoking status: Current Every Day Smoker    Packs/day: 0.50    Types: Cigarettes  . Smokeless tobacco: Never Used  Substance and Sexual Activity  . Alcohol use: Yes    Alcohol/week: 1.2 oz    Types: 2 Standard drinks or equivalent per week    Comment: beer at times. not often  . Drug use: Yes    Frequency: 7.0 times per week    Types: Marijuana    Comment: occ  . Sexual activity: Not Currently     Partners: Male    Comment: accepted condoms  Other Topics Concern  . Not on file  Social History Narrative  . Not on file    Family History  Problem Relation Age of Onset  . Cancer Mother        laryngeal  . Pneumonia Father   . Diabetes Father   . Hypertension Sister   . Crohn's disease Brother   . Crohn's disease Maternal Grandmother   . Cancer Maternal Grandmother        patient unsure of type  . Colon cancer Maternal Grandfather  Past Surgical History:  Procedure Laterality Date  . ESOPHAGOGASTRODUODENOSCOPY  03/27/2012   Procedure: ESOPHAGOGASTRODUODENOSCOPY (EGD);  Surgeon: Lear Ng, MD;  Location: [facility];  Service: Endoscopy;  Laterality: N/A;  . IRRIGATION AND DEBRIDEMENT ABSCESS N/A 03/13/2015   Procedure: IRRIGATION AND DEBRIDEMENT ABSCESS;  Surgeon: Johnathan Hausen, MD;  Location: [facility];  Service: General;  Laterality: N/A;  . WISDOM TOOTH EXTRACTION      ROS: Review of Systems  Complains of intermittent dysuria for the past few months.  No penile discharge.  Had some burning yesterday but none today.  PHYSICAL EXAM: BP 108/68   Pulse 79   Temp 98.8 F (37.1 C) (Oral)   Resp 16   Ht 5\' 8"  (1.727 m)   Wt 222 lb 3.2 oz (100.8 kg)   SpO2 95%   BMI 33.79 kg/m   Wt Readings from Last 3 Encounters:  09/01/17 222 lb 3.2 oz (100.8 kg)  07/25/17 210 lb (95.3 kg)  05/02/17 219 lb 9.6 oz (99.6 kg)   Physical Exam  General appearance - alert, well appearing, middle-aged African-American male and in no distress Mental status - alert, oriented to person, place, and time, normal mood, behavior, speech, dress, motor activity, and thought processes Nose - normal and patent, no erythema, discharge or polyps Mouth - mucous membranes moist, pharynx normal without lesions Neck - supple, no significant adenopathy Chest - clear to auscultation, no wheezes, rales or rhonchi, symmetric air entry Heart - normal rate, regular rhythm, normal S1, S2, no  murmurs, rubs, clicks or gallops Neurological -cranial nerves grossly intact.  Power 5/5 in all fours.  Gross sensation intact. MiniCog exam 5/5 Extremities -no edema.  Gross sensation intact.  Dorsalis pedis posterior tibialis and popliteal pulses 3+ bilaterally.  Legs are warm    Chemistry      Component Value Date/Time   NA 142 05/02/2017 1051   K 4.7 05/02/2017 1051   CL 103 05/02/2017 1051   CO2 24 05/02/2017 1051   BUN 12 05/02/2017 1051   CREATININE 1.38 (H) 05/02/2017 1051   CREATININE 1.43 (H) 09/13/2016 0936      Component Value Date/Time   CALCIUM 9.2 05/02/2017 1051   ALKPHOS 88 05/02/2017 1051   AST 23 05/02/2017 1051   ALT 14 05/02/2017 1051   BILITOT 0.3 05/02/2017 1051     Lab Results  Component Value Date   WBC 8.2 12/22/2016   HGB 15.9 12/22/2016   HCT 46.9 12/22/2016   MCV 89 12/22/2016   PLT 225 12/22/2016   Vit D level 26  ASSESSMENT AND PLAN: 1. Cough in adult Sounds like residual cough from upper respiratory infection.  Recommend symptomatic treatment. - benzonatate (TESSALON) 100 MG capsule; Take 1 capsule (100 mg total) by mouth 2 (two) times daily as needed for cough.  Dispense: 20 capsule; Refill: 0  2. Memory problem -Differential diagnoses include side effect of medications vs dementia can occur in patients with HIV.  I doubt it is related to contrast given for CT scan last year.  Will refer to neurology for further evaluation - Ambulatory referral to Neurology - Vitamin B12 - TSH  3. HIV (human immunodeficiency virus infection) ([place]) Keep upcoming ID appointment  4. Pain in both upper extremities Questionable etiology - Vitamin B12  5. Pain in both lower extremities - Vitamin B12  6. Dysuria - Urine cytology ancillary only - Urinalysis  7. Vitamin D deficiency Recommend taking vitamin D 400 IU over-the-counter daily  Patient was  given the opportunity to ask questions.  Patient verbalized understanding of the plan and was able  to repeat key elements of the plan.   Orders Placed This Encounter  Procedures  . Vitamin B12  . TSH  . Urinalysis  . Ambulatory referral to Neurology     Requested Prescriptions   Signed Prescriptions Disp Refills  . benzonatate (TESSALON) 100 MG capsule 20 capsule 0    Sig: Take 1 capsule (100 mg total) by mouth 2 (two) times daily as needed for cough.    Return in about 4 months (around 01/01/2018).  Karle Plumber, MD, FACP

## 2017-09-02 LAB — URINALYSIS
Bilirubin, UA: NEGATIVE
Glucose, UA: NEGATIVE
Ketones, UA: NEGATIVE
NITRITE UA: NEGATIVE
PH UA: 6.5 (ref 5.0–7.5)
Protein, UA: NEGATIVE
RBC, UA: NEGATIVE
Specific Gravity, UA: 1.017 (ref 1.005–1.030)
Urobilinogen, Ur: 0.2 mg/dL (ref 0.2–1.0)

## 2017-09-02 LAB — VITAMIN B12: Vitamin B-12: 703 pg/mL (ref 232–1245)

## 2017-09-02 LAB — TSH: TSH: 1.15 u[IU]/mL (ref 0.450–4.500)

## 2017-09-03 LAB — URINE CYTOLOGY ANCILLARY ONLY
CHLAMYDIA, DNA PROBE: NEGATIVE
NEISSERIA GONORRHEA: NEGATIVE
TRICH (WINDOWPATH): NEGATIVE

## 2017-09-08 ENCOUNTER — Other Ambulatory Visit: Payer: Self-pay | Admitting: Pharmacist

## 2017-09-08 DIAGNOSIS — E782 Mixed hyperlipidemia: Secondary | ICD-10-CM

## 2017-09-08 MED ORDER — PRAVASTATIN SODIUM 40 MG PO TABS: ORAL_TABLET | ORAL | 0 refills | Status: DC

## 2017-09-12 MED FILL — $Viagra 50mg tablet: 50 | 30 days supply | Qty: 10 | Fill #7

## 2017-09-14 MED FILL — CYCLOBENZAPRINE 10 MG TAB: 10 | 10 days supply | Qty: 30 | Fill #0

## 2017-09-20 ENCOUNTER — Ambulatory Visit (INDEPENDENT_AMBULATORY_CARE_PROVIDER_SITE_OTHER): Payer: Self-pay | Admitting: Internal Medicine

## 2017-09-20 ENCOUNTER — Encounter: Payer: Self-pay | Admitting: Internal Medicine

## 2017-09-20 VITALS — BP 106/67 | HR 73 | Temp 98.6°F | Resp 18 | Ht 68.0 in | Wt 220.0 lb

## 2017-09-20 DIAGNOSIS — Z113 Encounter for screening for infections with a predominantly sexual mode of transmission: Secondary | ICD-10-CM

## 2017-09-20 DIAGNOSIS — B009 Herpesviral infection, unspecified: Secondary | ICD-10-CM

## 2017-09-20 DIAGNOSIS — B20 Human immunodeficiency virus [HIV] disease: Secondary | ICD-10-CM

## 2017-09-20 NOTE — Assessment & Plan Note (Signed)
Burning with urination.  May be an HSV lesion interior.  He will try valtrex next time it occurs to see if it lessens it.
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
## 2017-09-20 NOTE — Assessment & Plan Note (Signed)
Doing well with last labs in December and suppressed.  Labs today and in 6 months prior to appt.

## 2017-09-20 NOTE — Assessment & Plan Note (Signed)
Will screen today 

## 2017-09-21 LAB — CBC WITH DIFFERENTIAL/PLATELET
BASOS ABS: 32 {cells}/uL (ref 0–200)
Basophils Relative: 0.5 %
EOS ABS: 88 {cells}/uL (ref 15–500)
Eosinophils Relative: 1.4 %
HCT: 46.5 % (ref 38.5–50.0)
Hemoglobin: 16 g/dL (ref 13.2–17.1)
Lymphs Abs: 2413 cells/uL (ref 850–3900)
MCH: 30.4 pg (ref 27.0–33.0)
MCHC: 34.4 g/dL (ref 32.0–36.0)
MCV: 88.4 fL (ref 80.0–100.0)
MPV: 10.8 fL (ref 7.5–12.5)
Monocytes Relative: 7.3 %
NEUTROS PCT: 52.5 %
Neutro Abs: 3308 cells/uL (ref 1500–7800)
PLATELETS: 202 10*3/uL (ref 140–400)
RBC: 5.26 10*6/uL (ref 4.20–5.80)
RDW: 13.1 % (ref 11.0–15.0)
TOTAL LYMPHOCYTE: 38.3 %
WBC: 6.3 10*3/uL (ref 3.8–10.8)
WBCMIX: 460 {cells}/uL (ref 200–950)

## 2017-09-21 LAB — COMPLETE METABOLIC PANEL WITH GFR
AG Ratio: 1.7 (calc) (ref 1.0–2.5)
ALBUMIN MSPROF: 4.3 g/dL (ref 3.6–5.1)
ALKALINE PHOSPHATASE (APISO): 74 U/L (ref 40–115)
ALT: 13 U/L (ref 9–46)
AST: 21 U/L (ref 10–40)
BUN / CREAT RATIO: 11 (calc) (ref 6–22)
BUN: 15 mg/dL (ref 7–25)
CO2: 29 mmol/L (ref 20–32)
CREATININE: 1.4 mg/dL — AB (ref 0.60–1.35)
Calcium: 9.8 mg/dL (ref 8.6–10.3)
Chloride: 105 mmol/L (ref 98–110)
GFR, EST AFRICAN AMERICAN: 70 mL/min/{1.73_m2} (ref >=60)
GFR, Est Non African American: 61 mL/min/{1.73_m2} (ref >=60)
GLOBULIN: 2.6 g/dL (ref 1.9–3.7)
Glucose, Bld: 78 mg/dL (ref 65–99)
Potassium: 4.4 mmol/L (ref 3.5–5.3)
SODIUM: 138 mmol/L (ref 135–146)
Total Bilirubin: 0.3 mg/dL (ref 0.2–1.2)
Total Protein: 6.9 g/dL (ref 6.1–8.1)

## 2017-09-21 LAB — URINE CYTOLOGY ANCILLARY ONLY
CHLAMYDIA, DNA PROBE: NEGATIVE
NEISSERIA GONORRHEA: NEGATIVE

## 2017-09-21 LAB — T-HELPER CELL (CD4) - (RCID CLINIC ONLY)
CD4 T CELL ABS: 690 /uL (ref 400–2700)
CD4 T CELL HELPER: 25 % — AB (ref 33–55)

## 2017-09-21 LAB — RPR: RPR Ser Ql: NONREACTIVE

## 2017-09-22 ENCOUNTER — Ambulatory Visit: Payer: Self-pay | Attending: Internal Medicine

## 2017-09-23 LAB — HIV-1 RNA QUANT-NO REFLEX-BLD
HIV 1 RNA Quant: <20 copies/mL
HIV-1 RNA Quant, Log: <1.3 Log copies/mL

## 2017-09-28 ENCOUNTER — Encounter: Payer: Self-pay | Admitting: Psychology

## 2017-10-11 MED FILL — $Viagra 50mg tablet: 50 | 30 days supply | Qty: 10 | Fill #8

## 2017-10-12 ENCOUNTER — Other Ambulatory Visit: Payer: Self-pay | Admitting: Internal Medicine

## 2017-11-09 MED FILL — $Viagra 50mg tablet: 50 | 30 days supply | Qty: 10 | Fill #9

## 2017-11-16 ENCOUNTER — Telehealth: Payer: Self-pay | Admitting: Internal Medicine

## 2017-11-16 DIAGNOSIS — M7062 Trochanteric bursitis, left hip: Principal | ICD-10-CM

## 2017-11-16 DIAGNOSIS — M7061 Trochanteric bursitis, right hip: Secondary | ICD-10-CM

## 2017-11-16 MED ORDER — CYCLOBENZAPRINE HCL 10 MG PO TABS: 10.0000 mg | ORAL_TABLET | Freq: Every day | ORAL | 0 refills | Status: DC | PRN

## 2017-11-16 NOTE — Telephone Encounter (Signed)
-----   Message from Jackelyn Knife, [place] sent at 11/15/2017 11:40 AM EDT ----- Pt is requestieng Flexeril to be refilled last filled by [facility] on 06-13-17 Pt would like rx sent to [facility]

## 2017-12-15 ENCOUNTER — Other Ambulatory Visit: Payer: Self-pay | Admitting: Internal Medicine

## 2017-12-15 DIAGNOSIS — B20 Human immunodeficiency virus [HIV] disease: Secondary | ICD-10-CM

## 2017-12-18 ENCOUNTER — Emergency Department (HOSPITAL_COMMUNITY): Payer: Self-pay

## 2017-12-18 ENCOUNTER — Emergency Department (HOSPITAL_COMMUNITY)
Admission: EM | Admit: 2017-12-18 | Discharge: 2017-12-18 | Disposition: A | Discharge status: Home / Self Care | Payer: Self-pay | Attending: Emergency Medicine | Admitting: Emergency Medicine

## 2017-12-18 ENCOUNTER — Encounter (HOSPITAL_COMMUNITY): Payer: Self-pay | Admitting: Emergency Medicine

## 2017-12-18 DIAGNOSIS — Z23 Encounter for immunization: Secondary | ICD-10-CM | POA: Insufficient documentation

## 2017-12-18 DIAGNOSIS — S61214A Laceration without foreign body of right ring finger without damage to nail, initial encounter: Secondary | ICD-10-CM | POA: Insufficient documentation

## 2017-12-18 DIAGNOSIS — B20 Human immunodeficiency virus [HIV] disease: Secondary | ICD-10-CM | POA: Insufficient documentation

## 2017-12-18 DIAGNOSIS — F17210 Nicotine dependence, cigarettes, uncomplicated: Secondary | ICD-10-CM | POA: Insufficient documentation

## 2017-12-18 DIAGNOSIS — W260XXA Contact with knife, initial encounter: Secondary | ICD-10-CM | POA: Insufficient documentation

## 2017-12-18 DIAGNOSIS — I129 Hypertensive chronic kidney disease with stage 1 through stage 4 chronic kidney disease, or unspecified chronic kidney disease: Secondary | ICD-10-CM | POA: Insufficient documentation

## 2017-12-18 DIAGNOSIS — Y9389 Activity, other specified: Secondary | ICD-10-CM | POA: Insufficient documentation

## 2017-12-18 DIAGNOSIS — Y990 Civilian activity done for income or pay: Secondary | ICD-10-CM | POA: Insufficient documentation

## 2017-12-18 DIAGNOSIS — E1122 Type 2 diabetes mellitus with diabetic chronic kidney disease: Secondary | ICD-10-CM | POA: Insufficient documentation

## 2017-12-18 DIAGNOSIS — N183 Chronic kidney disease, stage 3 (moderate): Secondary | ICD-10-CM | POA: Insufficient documentation

## 2017-12-18 DIAGNOSIS — Z79899 Other long term (current) drug therapy: Secondary | ICD-10-CM | POA: Insufficient documentation

## 2017-12-18 DIAGNOSIS — Y929 Unspecified place or not applicable: Secondary | ICD-10-CM | POA: Insufficient documentation

## 2017-12-18 MED ORDER — TETANUS-DIPHTH-ACELL PERTUSSIS 5-2.5-18.5 LF-MCG/0.5 IM SUSP
0.5000 mL | Freq: Once | INTRAMUSCULAR | Status: AC
  Administered 2017-12-18: 0.5 mL via INTRAMUSCULAR
  Filled 2017-12-18: qty 0.5

## 2017-12-18 MED ORDER — LIDOCAINE HCL 2 % IJ SOLN
10.0000 mL | Freq: Once | INTRAMUSCULAR | Status: AC
  Administered 2017-12-18: 200 mg
  Filled 2017-12-18: qty 20

## 2017-12-18 MED ORDER — NAPROXEN 500 MG PO TABS: 500.0000 mg | ORAL_TABLET | Freq: Two times a day (BID) | ORAL | 0 refills | Status: DC

## 2017-12-18 NOTE — ED Provider Notes (Signed)
[facility] EMERGENCY DEPARTMENT Provider Note   CSN: [id number] Arrival date & time: 12/18/17  3300     History   Chief Complaint Chief Complaint  Patient presents with  . Laceration    HPI Shawn Brooks is a 42 y.o. male.  HPI  This is a 42 year old male who presents with right fourth finger injury.  Patient reports that he was at work when he reached down to grab a knife.  The knife was placed in the wrong position and he cut his right fourth finger.  He noted bleeding.  He has not noticed any numbness.  Unknown last tetanus shot.  He is right-handed.  He has not taken anything for his pain.  Rates his pain at 6 out of 10.  Past Medical History:  Diagnosis Date  . Anxiety   . Blood dyscrasia    HIV  . Chronic back pain   . Depression   . DJD (degenerative joint disease)   . Gastric ulcer   . Headache(784.0)   . HIV infection ([place])   . Hyperplastic colon polyp   . Hypertension     Patient Active Problem List   Diagnosis Date Noted  . Memory problem 09/01/2017  . Vitamin D deficiency 09/01/2017  . Primary osteoarthritis of left knee 05/02/2017  . Needs flu shot 05/02/2017  . Stage 3 chronic kidney disease ([place]) 05/02/2017  . Prediabetes 12/22/2016  . Diabetes mellitus screening 11/17/2016  . Tobacco abuse 05/31/2016  . Lumbar transverse process fracture ([place]) 10/02/2015  . Screening examination for sexually transmitted disease 04/04/2014  . Seizures ([place]) 08/30/2013  . Personal history of digestive disease 01/15/2013  . Thoracic or lumbosacral neuritis or radiculitis 01/15/2013  . H/O gastrointestinal disease 01/15/2013  . Lumbar radiculopathy 01/15/2013  . 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 Condyloma acuminatum 11/17/2012  . 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 AGW (anogenital warts) 11/17/2012  . Hemorrhoid 09/27/2012  . BRBPR (bright red blood per rectum) 07/27/2012  . Prostatitis 04/12/2012  .  Constipation 03/09/2012  . Callus 08/16/2011  . Ingrown toenail 08/16/2011  . Chronic low back pain 08/16/2011  . Dyslipidemia 09/03/2010  . Herpes simplex virus (HSV) infection 07/06/2010  . Erectile dysfunction 07/06/2010  . ANXIETY DEPRESSION 05/06/2010  . BURSITIS, KNEE 05/06/2010  . Human immunodeficiency virus (HIV) disease ([place]) 04/16/2010    Past Surgical History:  Procedure Laterality Date  . ESOPHAGOGASTRODUODENOSCOPY  03/27/2012   Procedure: ESOPHAGOGASTRODUODENOSCOPY (EGD);  Surgeon: Lear Ng, MD;  Location: [facility];  Service: Endoscopy;  Laterality: N/A;  . IRRIGATION AND DEBRIDEMENT ABSCESS N/A 03/13/2015   Procedure: IRRIGATION AND DEBRIDEMENT ABSCESS;  Surgeon: Johnathan Hausen, MD;  Location: [facility];  Service: General;  Laterality: N/A;  . WISDOM TOOTH EXTRACTION          Home Medications    Prior to Admission medications   Medication Sig Start Date End Date Taking? Authorizing Provider  acetaminophen-codeine (TYLENOL #3) 300-30 MG tablet Take 1 tablet every 4 (four) hours as needed by mouth. 05/02/17   Tresa Garter, MD  benzonatate (TESSALON) 100 MG capsule Take 1 capsule (100 mg total) by mouth 2 (two) times daily as needed for cough. 09/01/17   Ladell Pier, MD  cyclobenzaprine (FLEXERIL) 10 MG tablet Take 1 tablet (10 mg total) by mouth daily as needed for muscle spasms. 11/16/17   Ladell Pier, MD  diclofenac sodium (VOLTAREN) 1 % GEL Apply 2 g  4 (four) times daily topically. 05/02/17   Tresa Garter, MD  gabapentin (NEURONTIN) 300 MG capsule Take 1 capsule (300 mg total) by mouth 3 (three) times daily. 11/17/16   Maren Reamer, MD  GENVOYA 150-150-200-10 MG TABS tablet TAKE 1 TABLET BY MOUTH EVERY DAY WITH PREZISTA 07/04/17   Comer, Okey Regal, MD  GENVOYA 150-150-200-10 MG TABS tablet TAKE 1 TABLET BY MOUTH EVERY DAY WITH PREZISTA 12/15/17   Comer, Okey Regal, MD  methocarbamol (ROBAXIN) 500 MG tablet Take 1 tablet (500 mg  total) by mouth every 8 (eight) hours as needed for muscle spasms. 07/25/17   Petrucelli, Samantha R, PA-C  naproxen (NAPROSYN) 500 MG tablet Take 1 tablet (500 mg total) by mouth 2 (two) times daily. 12/18/17   Chanta Bauers, Barbette Hair, MD  pravastatin (PRAVACHOL) 40 MG tablet TAKE 1 TABLET(40 MG) BY MOUTH DAILY 09/08/17   Ladell Pier, MD  PREZISTA 800 MG tablet TAKE 1 TABLET BY MOUTH EVERY DAY 07/04/17   Thayer Headings, MD  PREZISTA 800 MG tablet TAKE 1 TABLET BY MOUTH EVERY DAY 12/15/17   Comer, Okey Regal, MD  ranitidine (ZANTAC) 150 MG tablet TAKE 1 TO 2 TABLETS BY MOUTH TWICE DAILY 12/15/17   Pyrtle, Lajuan Lines, MD  sildenafil (VIAGRA) 50 MG tablet Take 1 tablet (50 mg total) by mouth daily as needed for erectile dysfunction. 01/03/17   Tresa Garter, MD  traZODone (DESYREL) 100 MG tablet Take 300 mg by mouth at bedtime.  07/24/14   [provider]  valACYclovir (VALTREX) 500 MG tablet TAKE 1 TABLET BY MOUTH TWICE DAILY AS NEEDED FOR FLARE UPS 09/20/16   Comer, Okey Regal, MD    Family History Family History  Problem Relation Age of Onset  . Cancer Mother        laryngeal  . Pneumonia Father   . Diabetes Father   . Hypertension Sister   . Crohn's disease Brother   . Crohn's disease Maternal Grandmother   . Cancer Maternal Grandmother        patient unsure of type  . Colon cancer Maternal Grandfather     Social History Social History   Tobacco Use  . Smoking status: Current Every Day Smoker    Packs/day: 0.50    Types: Cigarettes  . Smokeless tobacco: Never Used  Substance Use Topics  . Alcohol use: Yes    Alcohol/week: 1.2 oz    Types: 2 Standard drinks or equivalent per week    Comment: beer at times. not often  . Drug use: Yes    Frequency: 7.0 times per week    Types: Marijuana    Comment: occ     Allergies   Oxycodone   Review of Systems Review of Systems  Musculoskeletal:       Right fourth finger pain  Skin: Positive for wound.  All other systems  reviewed and are negative.    Physical Exam Updated Vital Signs BP 124/79 (BP Location: Left Arm)   Pulse 77   Temp 98.3 F (36.8 C) (Oral)   Resp 18   Ht 5\' 8"  (1.727 m)   Wt 95.3 kg (210 lb)   SpO2 100%   BMI 31.93 kg/m   Physical Exam  Constitutional: He is oriented to person, place, and time. He appears well-developed and well-nourished. No distress.  HENT:  Head: Normocephalic and atraumatic.  Cardiovascular: Normal rate and regular rhythm.  Pulmonary/Chest: Effort normal. No respiratory distress.  Musculoskeletal: He exhibits no  edema.  Normal range of motion with flexion and extension of all of fingers on the right hand  Lymphadenopathy:    He has no cervical adenopathy.  Neurological: He is alert and oriented to person, place, and time.  Skin: Skin is warm and dry.  3 cm laceration causing an avulsion of the pad of the right fourth digit, bleeding controlled, sensation intact, good cap refill  Psychiatric: He has a normal mood and affect.  Nursing note and vitals reviewed.    ED Treatments / Results  Labs (all labs ordered are listed, but only abnormal results are displayed) Labs Reviewed - No data to display  EKG None  Radiology Dg Hand Complete Right  Result Date: 12/18/2017 CLINICAL DATA:  Injury laceration to fourth digit EXAM: RIGHT HAND - COMPLETE 3+ VIEW COMPARISON:  None. FINDINGS: There is no evidence of fracture or dislocation. There is no evidence of arthropathy or other focal bone abnormality. Soft tissues are unremarkable. IMPRESSION: Negative. Electronically Signed   By: Donavan Foil M.D.   On: 12/18/2017 03:52    Procedures .Marland KitchenLaceration Repair Date/Time: 12/18/2017 5:10 AM Performed by: Merryl Hacker, MD Authorized by: Merryl Hacker, MD   Consent:    Consent obtained:  Verbal   Consent given by:  Patient   Risks discussed:  Infection, pain and poor cosmetic result Anesthesia (see MAR for exact dosages):    Anesthesia method:   Nerve block   Block location:  Digital block   Block needle gauge:  27 G   Block anesthetic:  Lidocaine 2% w/o epi   Block technique:  Ring   Block injection procedure:  Anatomic landmarks identified, introduced needle and incremental injection   Block outcome:  Anesthesia achieved Laceration details:    Location:  Finger   Finger location:  R ring finger   Length (cm):  3   Depth (mm):  5 Repair type:    Repair type:  Simple Pre-procedure details:    Preparation:  Patient was prepped and draped in usual sterile fashion Exploration:    Hemostasis achieved with:  Tourniquet   Wound extent: no areolar tissue violation noted and no nerve damage noted     Contaminated: no   Treatment:    Area cleansed with:  Betadine and saline   Amount of cleaning:  Standard   Irrigation solution:  Sterile saline   Irrigation method:  Pressure wash   Visualized foreign bodies/material removed: no   Skin repair:    Repair method:  Sutures   Suture size:  5-0   Suture material:  Nylon   Suture technique:  Simple interrupted   Number of sutures:  6 Approximation:    Approximation:  Close Post-procedure details:    Dressing:  Antibiotic ointment and non-adherent dressing   Patient tolerance of procedure:  Tolerated well, no immediate complications   (including critical care time)  Medications Ordered in ED Medications  Tdap (BOOSTRIX) injection 0.5 mL (0.5 mLs Intramuscular Given 12/18/17 0438)  lidocaine (XYLOCAINE) 2 % (with pres) injection 200 mg (200 mg Infiltration Given 12/18/17 0438)     Initial Impression / Assessment and Plan / ED Course  I have reviewed the triage vital signs and the nursing notes.  Pertinent labs & imaging results that were available during my care of the patient were reviewed by me and considered in my medical decision making (see chart for details).     Patient presents with a laceration of the right fourth digit.  He  is overall nontoxic-appearing.  Tetanus  was updated.  Laceration was repaired.  Patient was advised of wound care and return precautions.  After history, exam, and medical workup I feel the patient has been appropriately medically screened and is safe for discharge home. Pertinent diagnoses were discussed with the patient. Patient was given return precautions.   Final Clinical Impressions(s) / ED Diagnoses   Final diagnoses:  Laceration of right ring finger without foreign body without damage to nail, initial encounter    ED Discharge Orders        Ordered    naproxen (NAPROSYN) 500 MG tablet  2 times daily     12/18/17 0505       Saina Waage, Barbette Hair, MD 12/18/17 [phone]

## 2017-12-18 NOTE — Discharge Instructions (Signed)
You were seen today and had a laceration repaired of your right fourth digit.  Return in 7 days for suture removal.  Do not submerge the wound.

## 2017-12-18 NOTE — ED Triage Notes (Signed)
Pt states he attempted to move knife while at work @ 0245, Ring finger right hand laceration, bleeding noted.

## 2017-12-19 MED FILL — $Viagra 50mg tablet: 50 | 30 days supply | Qty: 10 | Fill #10

## 2017-12-20 ENCOUNTER — Encounter: Payer: Self-pay | Admitting: Internal Medicine

## 2017-12-26 ENCOUNTER — Emergency Department (HOSPITAL_COMMUNITY)
Admission: EM | Admit: 2017-12-26 | Discharge: 2017-12-26 | Disposition: A | Discharge status: Home / Self Care | Payer: Self-pay | Attending: Emergency Medicine | Admitting: Emergency Medicine

## 2017-12-26 ENCOUNTER — Encounter (HOSPITAL_COMMUNITY): Payer: Self-pay | Admitting: Emergency Medicine

## 2017-12-26 DIAGNOSIS — I129 Hypertensive chronic kidney disease with stage 1 through stage 4 chronic kidney disease, or unspecified chronic kidney disease: Secondary | ICD-10-CM | POA: Insufficient documentation

## 2017-12-26 DIAGNOSIS — N183 Chronic kidney disease, stage 3 (moderate): Secondary | ICD-10-CM | POA: Insufficient documentation

## 2017-12-26 DIAGNOSIS — Z4802 Encounter for removal of sutures: Secondary | ICD-10-CM | POA: Insufficient documentation

## 2017-12-26 DIAGNOSIS — F17210 Nicotine dependence, cigarettes, uncomplicated: Secondary | ICD-10-CM | POA: Insufficient documentation

## 2017-12-26 DIAGNOSIS — M79644 Pain in right finger(s): Secondary | ICD-10-CM | POA: Insufficient documentation

## 2017-12-26 DIAGNOSIS — Z79899 Other long term (current) drug therapy: Secondary | ICD-10-CM | POA: Insufficient documentation

## 2017-12-26 MED ORDER — ACETAMINOPHEN 325 MG PO TABS
650.0000 mg | ORAL_TABLET | Freq: Once | ORAL | Status: AC
  Administered 2017-12-26: 650 mg via ORAL
  Filled 2017-12-26: qty 2

## 2017-12-26 MED ORDER — CEPHALEXIN 500 MG PO CAPS: 500.0000 mg | ORAL_CAPSULE | Freq: Four times a day (QID) | ORAL | 0 refills | Status: AC

## 2017-12-26 NOTE — ED Triage Notes (Signed)
Pt is here for suture removal of Right ring finger, had stitches placed on 6/23. No complaints, wound appears well healed, no drainage noted.

## 2017-12-26 NOTE — Discharge Instructions (Signed)
Thank you for allowing me to care for you today in the Emergency Department.   1 tablet of Keflex every 6 hours for the next 7 days.  This is an antibiotic.  Take 650 mg of Tylenol every 6 hours as needed for pain control.  I would avoid ibuprofen and other NSAIDs because this can cause your kidney function to worsen with your HIV medications.  If your pain does not start to improve by the time you finish the course of antibiotics, you should call Dr. Brennan Bailey office and schedule follow-up appointment.  He is a Copy.  Return to the emergency department if you develop fever, chills, or if the wound gets red, hot to the touch, very swollen, or if you see red streaking down the finger or hand.  These are all signs of infection.

## 2017-12-26 NOTE — ED Provider Notes (Signed)
[facility] EMERGENCY DEPARTMENT Provider Note   CSN: [id number] Arrival date & time: 12/26/17  9381     History   Chief Complaint Chief Complaint  Patient presents with  . Suture / Staple Removal    HPI Shawn Brooks is a 42 y.o. male with a history of HIV, stage III chronic kidney disease, and hypertension who presents to the emergency department for suture removal.  The patient was seen in the ED on June 23 after he sustained a laceration to his right ring finger.  The patient reports that he was at work when he reached down to grab a knife and cut his finger.  Knife was apparently clean.  He presented to the ED approximately 1 hour following the injury.  Tdap was updated in the ED.  6 sutures were placed in the right ring finger.  The patient is right-hand dominant.   The patient reports continued pain to the tip of the right ring finger and around the laceration.  He also endorses mild numbness and minimal swelling to the side of the digit.  He denies a fever, chills, purulent drainage from the wound, redness, or warmth.  He endorses good wound care to the digit at home.  He is followed in the infectious disease clinic by Dr. Linus Salmons for his HIV.  He is compliant with his home medications.  Last viral load was undetectable and CD4 count was 690 and March 2019.  No history of diabetes mellitus.  The history is provided by the patient. No language interpreter was used.    Past Medical History:  Diagnosis Date  . Anxiety   . Blood dyscrasia    HIV  . Chronic back pain   . Depression   . DJD (degenerative joint disease)   . Gastric ulcer   . Headache(784.0)   . HIV infection ([place])   . Hyperplastic colon polyp   . Hypertension     Patient Active Problem List   Diagnosis Date Noted  . Memory problem 09/01/2017  . Vitamin D deficiency 09/01/2017  . Primary osteoarthritis of left knee 05/02/2017  . Needs flu shot 05/02/2017  . Stage 3 chronic kidney  disease ([place]) 05/02/2017  . Prediabetes 12/22/2016  . Diabetes mellitus screening 11/17/2016  . Tobacco abuse 05/31/2016  . Lumbar transverse process fracture ([place]) 10/02/2015  . Screening examination for sexually transmitted disease 04/04/2014  . Seizures ([place]) 08/30/2013  . Personal history of digestive disease 01/15/2013  . Thoracic or lumbosacral neuritis or radiculitis 01/15/2013  . H/O gastrointestinal disease 01/15/2013  . Lumbar radiculopathy 01/15/2013  . 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 Condyloma acuminatum 11/17/2012  . 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 AGW (anogenital warts) 11/17/2012  . Hemorrhoid 09/27/2012  . BRBPR (bright red blood per rectum) 07/27/2012  . Prostatitis 04/12/2012  . Constipation 03/09/2012  . Callus 08/16/2011  . Ingrown toenail 08/16/2011  . Chronic low back pain 08/16/2011  . Dyslipidemia 09/03/2010  . Herpes simplex virus (HSV) infection 07/06/2010  . Erectile dysfunction 07/06/2010  . ANXIETY DEPRESSION 05/06/2010  . BURSITIS, KNEE 05/06/2010  . Human immunodeficiency virus (HIV) disease ([place]) 04/16/2010    Past Surgical History:  Procedure Laterality Date  . ESOPHAGOGASTRODUODENOSCOPY  03/27/2012   Procedure: ESOPHAGOGASTRODUODENOSCOPY (EGD);  Surgeon: Lear Ng, MD;  Location: [facility];  Service: Endoscopy;  Laterality: N/A;  . IRRIGATION AND DEBRIDEMENT ABSCESS N/A 03/13/2015   Procedure: IRRIGATION AND DEBRIDEMENT ABSCESS;  Surgeon:  Johnathan Hausen, MD;  Location: [facility];  Service: General;  Laterality: N/A;  . WISDOM TOOTH EXTRACTION          Home Medications    Prior to Admission medications   Medication Sig Start Date End Date Taking? Authorizing Provider  acetaminophen-codeine (TYLENOL #3) 300-30 MG tablet Take 1 tablet every 4 (four) hours as needed by mouth. 05/02/17   Tresa Garter, MD  benzonatate (TESSALON) 100 MG capsule Take 1 capsule (100 mg total) by mouth 2  (two) times daily as needed for cough. 09/01/17   Ladell Pier, MD  cephALEXin (KEFLEX) 500 MG capsule Take 1 capsule (500 mg total) by mouth 4 (four) times daily for 7 days. 12/26/17 01/02/18  Jaevon Paras A, PA-C  cyclobenzaprine (FLEXERIL) 10 MG tablet Take 1 tablet (10 mg total) by mouth daily as needed for muscle spasms. 11/16/17   Ladell Pier, MD  diclofenac sodium (VOLTAREN) 1 % GEL Apply 2 g 4 (four) times daily topically. 05/02/17   Tresa Garter, MD  gabapentin (NEURONTIN) 300 MG capsule Take 1 capsule (300 mg total) by mouth 3 (three) times daily. 11/17/16   Maren Reamer, MD  GENVOYA 150-150-200-10 MG TABS tablet TAKE 1 TABLET BY MOUTH EVERY DAY WITH PREZISTA 07/04/17   Comer, Okey Regal, MD  GENVOYA 150-150-200-10 MG TABS tablet TAKE 1 TABLET BY MOUTH EVERY DAY WITH PREZISTA 12/15/17   Comer, Okey Regal, MD  methocarbamol (ROBAXIN) 500 MG tablet Take 1 tablet (500 mg total) by mouth every 8 (eight) hours as needed for muscle spasms. 07/25/17   Petrucelli, Samantha R, PA-C  naproxen (NAPROSYN) 500 MG tablet Take 1 tablet (500 mg total) by mouth 2 (two) times daily. 12/18/17   Horton, Barbette Hair, MD  pravastatin (PRAVACHOL) 40 MG tablet TAKE 1 TABLET(40 MG) BY MOUTH DAILY 09/08/17   Ladell Pier, MD  PREZISTA 800 MG tablet TAKE 1 TABLET BY MOUTH EVERY DAY 07/04/17   Thayer Headings, MD  PREZISTA 800 MG tablet TAKE 1 TABLET BY MOUTH EVERY DAY 12/15/17   Comer, Okey Regal, MD  ranitidine (ZANTAC) 150 MG tablet TAKE 1 TO 2 TABLETS BY MOUTH TWICE DAILY 12/15/17   Pyrtle, Lajuan Lines, MD  sildenafil (VIAGRA) 50 MG tablet Take 1 tablet (50 mg total) by mouth daily as needed for erectile dysfunction. 01/03/17   Tresa Garter, MD  traZODone (DESYREL) 100 MG tablet Take 300 mg by mouth at bedtime.  07/24/14   [provider]  valACYclovir (VALTREX) 500 MG tablet TAKE 1 TABLET BY MOUTH TWICE DAILY AS NEEDED FOR FLARE UPS 09/20/16   Comer, Okey Regal, MD    Family History Family History    Problem Relation Age of Onset  . Cancer Mother        laryngeal  . Pneumonia Father   . Diabetes Father   . Hypertension Sister   . Crohn's disease Brother   . Crohn's disease Maternal Grandmother   . Cancer Maternal Grandmother        patient unsure of type  . Colon cancer Maternal Grandfather     Social History Social History   Tobacco Use  . Smoking status: Current Every Day Smoker    Packs/day: 0.50    Types: Cigarettes  . Smokeless tobacco: Never Used  Substance Use Topics  . Alcohol use: Yes    Alcohol/week: 1.2 oz    Types: 2 Standard drinks or equivalent per week    Comment: beer at  times. not often  . Drug use: Yes    Frequency: 7.0 times per week    Types: Marijuana    Comment: occ     Allergies   Oxycodone   Review of Systems Review of Systems  Constitutional: Negative for activity change, chills and fever.  Respiratory: Negative for shortness of breath.   Cardiovascular: Negative for chest pain.  Gastrointestinal: Negative for abdominal pain.  Musculoskeletal: Negative for back pain.  Skin: Negative for rash.     Physical Exam Updated Vital Signs BP 100/66   Pulse 64   Temp 98.2 F (36.8 C) (Oral)   Resp 18   Ht 5\' 8"  (1.727 m)   Wt 95.3 kg (210 lb)   SpO2 99%   BMI 31.93 kg/m   Physical Exam  Constitutional: He appears well-developed.  HENT:  Head: Normocephalic.  Well appearing. NAD.   Eyes: Conjunctivae are normal.  Neck: Neck supple.  Cardiovascular: Normal rate and regular rhythm.  No murmur heard. Pulmonary/Chest: Effort normal.  Abdominal: Soft. He exhibits no distension.  Neurological: He is alert.  Skin: Skin is warm and dry.  6 simple interrupted sutures are in place to the right ring finger.  Diffusely tender to the distal aspect of the finger with minimal palpation.  Laceration extends from the radio medial aspect of the digit, superior to the DIP joint to the distal tip of the finger.  No involvement of the nail.   Proximal portion of the wound appears well-healing.  The mid to distal portion of the wound appears to have some minimal dehiscence and a minimal superficial hematoma.  No gangrene.  Radial pulses are 2+ and symmetric.  Full active and passive range of motion of all  small joints to all digits in the right hand and right wrist.  Endorses decreased sensation to the medioradial aspect of the right fourth digit, but sensation is intact along the 3 other distal aspects.  Good capillary refill.  Minimal swelling is noted to the tip of the finger, but no erythema or warmth.  Psychiatric: His behavior is normal.  Nursing note and vitals reviewed.    ED Treatments / Results  Labs (all labs ordered are listed, but only abnormal results are displayed) Labs Reviewed - No data to display  EKG None  Radiology No results found.  Procedures .Suture Removal Date/Time: 12/26/2017 8:50 AM Performed by: Joanne Gavel, PA-C Authorized by: Joanne Gavel, PA-C   Consent:    Consent obtained:  Verbal   Consent given by:  Patient   Risks discussed:  Wound separation, pain and bleeding   Alternatives discussed:  No treatment and referral Location:    Location:  Upper extremity   Upper extremity location:  Hand   Hand location:  R ring finger Procedure details:    Wound appearance:  Tender and nonpurulent (Mildly edematous)   Number of sutures removed:  6 Post-procedure details:    Post-removal:  No dressing applied   Patient tolerance of procedure:  Tolerated well, no immediate complications   (including critical care time)  Medications Ordered in ED Medications  acetaminophen (TYLENOL) tablet 650 mg (650 mg Oral Given 12/26/17 0746)     Initial Impression / Assessment and Plan / ED Course  I have reviewed the triage vital signs and the nursing notes.  Pertinent labs & imaging results that were available during my care of the patient were reviewed by me and considered in my medical  decision making (see chart  for details).     42 year old male with a history of HIV, stage III chronic kidney disease, and hypertension who presents to the emergency department for suture removal  After having 6 simple interrupted sutures placed on June 23 secondary to a clean knife injury at work.  On exam, he is diffusely tender to the distal tip of the right ring finger and there is some mild subjective decreased sensation and minimal edema noted to the medioradial aspect of the digit.  There does appear to be a extremely small hematoma noted adjacent to the wound that does not require I&D.  He is right-hand dominant.   He is compliant with his home HIV treatment and last viral load was undetectable.  CD4 count 690 in March 2019. The patient was seen and evaluated along with Dr. Sherry Ruffing, attending physician. Suture removal and wound check as above. Procedure was moderately painful.  No purulent drainage from the wound.  We will discharge the patient with a course of Keflex and a referral to hand surgery if pain does not improve following the course of Keflex.  He has also been given strict return precautions to the emergency department.  He is hemodynamically stable and in no acute distress.  He is safe for discharge home at this time.  Final Clinical Impressions(s) / ED Diagnoses   Final diagnoses:  Visit for suture removal  Pain of finger of right hand    ED Discharge Orders        Ordered    cephALEXin (KEFLEX) 500 MG capsule  4 times daily     12/26/17 0743       Joanne Gavel, PA-C 12/26/17 0857    Tegeler, Gwenyth Allegra, MD 12/26/17 [phone]

## 2018-01-04 ENCOUNTER — Other Ambulatory Visit: Payer: Self-pay

## 2018-01-04 ENCOUNTER — Telehealth: Payer: Self-pay | Admitting: Internal Medicine

## 2018-01-04 ENCOUNTER — Emergency Department (HOSPITAL_COMMUNITY)
Admission: EM | Admit: 2018-01-04 | Discharge: 2018-01-04 | Disposition: A | Discharge status: Home / Self Care | Payer: Self-pay | Attending: Emergency Medicine | Admitting: Emergency Medicine

## 2018-01-04 ENCOUNTER — Telehealth: Payer: Self-pay | Admitting: *Deleted

## 2018-01-04 ENCOUNTER — Encounter (HOSPITAL_COMMUNITY): Payer: Self-pay

## 2018-01-04 DIAGNOSIS — I129 Hypertensive chronic kidney disease with stage 1 through stage 4 chronic kidney disease, or unspecified chronic kidney disease: Secondary | ICD-10-CM | POA: Insufficient documentation

## 2018-01-04 DIAGNOSIS — R110 Nausea: Secondary | ICD-10-CM | POA: Insufficient documentation

## 2018-01-04 DIAGNOSIS — B20 Human immunodeficiency virus [HIV] disease: Secondary | ICD-10-CM | POA: Insufficient documentation

## 2018-01-04 DIAGNOSIS — Z79899 Other long term (current) drug therapy: Secondary | ICD-10-CM | POA: Insufficient documentation

## 2018-01-04 DIAGNOSIS — F1210 Cannabis abuse, uncomplicated: Secondary | ICD-10-CM | POA: Insufficient documentation

## 2018-01-04 DIAGNOSIS — Z7902 Long term (current) use of antithrombotics/antiplatelets: Secondary | ICD-10-CM | POA: Insufficient documentation

## 2018-01-04 DIAGNOSIS — F17210 Nicotine dependence, cigarettes, uncomplicated: Secondary | ICD-10-CM | POA: Insufficient documentation

## 2018-01-04 DIAGNOSIS — K922 Gastrointestinal hemorrhage, unspecified: Secondary | ICD-10-CM | POA: Insufficient documentation

## 2018-01-04 DIAGNOSIS — N183 Chronic kidney disease, stage 3 (moderate): Secondary | ICD-10-CM | POA: Insufficient documentation

## 2018-01-04 DIAGNOSIS — R1084 Generalized abdominal pain: Secondary | ICD-10-CM | POA: Insufficient documentation

## 2018-01-04 LAB — COMPREHENSIVE METABOLIC PANEL
ALBUMIN: 4.2 g/dL (ref 3.5–5.0)
ALT: 20 U/L (ref 0–44)
ANION GAP: 7 (ref 5–15)
AST: 25 U/L (ref 15–41)
Alkaline Phosphatase: 82 U/L (ref 38–126)
BILIRUBIN TOTAL: 0.7 mg/dL (ref 0.3–1.2)
BUN: 9 mg/dL (ref 6–20)
CHLORIDE: 108 mmol/L (ref 98–111)
CO2: 23 mmol/L (ref 22–32)
Calcium: 9.1 mg/dL (ref 8.9–10.3)
Creatinine, Ser: 1.33 mg/dL — ABNORMAL HIGH (ref 0.61–1.24)
GFR calc Af Amer: >60 mL/min (ref >60)
GFR calc non Af Amer: >60 mL/min (ref >60)
Glucose, Bld: 91 mg/dL (ref 70–99)
Potassium: 4.1 mmol/L (ref 3.5–5.1)
SODIUM: 138 mmol/L (ref 135–145)
Total Protein: 7.7 g/dL (ref 6.5–8.1)

## 2018-01-04 LAB — TYPE AND SCREEN
ABO/RH(D): O POS
ANTIBODY SCREEN: NEGATIVE

## 2018-01-04 LAB — CBC
HCT: 48.3 % (ref 39.0–52.0)
HEMOGLOBIN: 16.5 g/dL (ref 13.0–17.0)
MCH: 31.4 pg (ref 26.0–34.0)
MCHC: 34.2 g/dL (ref 30.0–36.0)
MCV: 92 fL (ref 78.0–100.0)
Platelets: 190 10*3/uL (ref 150–400)
RBC: 5.25 MIL/uL (ref 4.22–5.81)
RDW: 14 % (ref 11.5–15.5)
WBC: 6.6 10*3/uL (ref 4.0–10.5)

## 2018-01-04 LAB — MAGNESIUM: MAGNESIUM: 2 mg/dL (ref 1.7–2.4)

## 2018-01-04 LAB — ABO/RH: ABO/RH(D): O POS

## 2018-01-04 MED ORDER — LACTATED RINGERS IV BOLUS
1000.0000 mL | Freq: Once | INTRAVENOUS | Status: AC
  Administered 2018-01-04: 1000 mL via INTRAVENOUS

## 2018-01-04 MED ORDER — ONDANSETRON HCL 4 MG/2ML IJ SOLN
4.0000 mg | Freq: Once | INTRAMUSCULAR | Status: AC
  Administered 2018-01-04: 4 mg via INTRAVENOUS
  Filled 2018-01-04: qty 2

## 2018-01-04 MED ORDER — HYDROCODONE-ACETAMINOPHEN 5-325 MG PO TABS
1.0000 | ORAL_TABLET | Freq: Once | ORAL | Status: AC
  Administered 2018-01-04: 1 via ORAL
  Filled 2018-01-04: qty 1

## 2018-01-04 NOTE — Telephone Encounter (Signed)
Pt was at the ED this morning for abd pain and rectal bleeding. Pt stated that has been having diarrhea and bleeding for the past week. First available with AP on 01/20/18. He is requesting to be seen sooner.

## 2018-01-04 NOTE — ED Provider Notes (Signed)
[name] DEPT Provider Note   CSN: [id number] Arrival date & time: 01/04/18  3818     History   Chief Complaint Chief Complaint  Patient presents with  . GI Bleeding  . Abdominal Pain  . recheck of finger laceration    HPI Shawn Brooks is a 42 y.o. male.  HPI Pt comes in with cc of diarrhea and bloody stools. Pt has hx of HIV (CD4> 500), internal hemorrhoids and has had diarrhea and abd discomfort x 3 days. Patient describes the abdominal pain as periumbilical, and cramping in nature.  Pain is fairly constant now.  The diarrhea is described as loose and watery and patient is having about 5 bowel movements a day.  Intermittently patient has noted bloody stools, that are bright to dark red in nature.  There is no mucus in the stools.  Patient denies any fevers or chills, but he is having nausea.  He also wants [place] to look at his right ring finger again.  The finger had a laceration which was repaired in the ED. patient wants to ensure it is not infected.  Overall he has noticed improvement in the pain and swelling and there is no purulent drainage.   Past Medical History:  Diagnosis Date  . Anxiety   . Blood dyscrasia    HIV  . Chronic back pain   . Depression   . DJD (degenerative joint disease)   . Gastric ulcer   . Headache(784.0)   . HIV infection ([place])   . Hyperplastic colon polyp   . Hypertension     Patient Active Problem List   Diagnosis Date Noted  . Memory problem 09/01/2017  . Vitamin D deficiency 09/01/2017  . Primary osteoarthritis of left knee 05/02/2017  . Needs flu shot 05/02/2017  . Stage 3 chronic kidney disease ([place]) 05/02/2017  . Prediabetes 12/22/2016  . Diabetes mellitus screening 11/17/2016  . Tobacco abuse 05/31/2016  . Lumbar transverse process fracture ([place]) 10/02/2015  . Screening examination for sexually transmitted disease 04/04/2014  . Seizures ([place]) 08/30/2013  . Personal history of digestive  disease 01/15/2013  . Thoracic or lumbosacral neuritis or radiculitis 01/15/2013  . H/O gastrointestinal disease 01/15/2013  . Lumbar radiculopathy 01/15/2013  . 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 Condyloma acuminatum 11/17/2012  . 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 AGW (anogenital warts) 11/17/2012  . Hemorrhoid 09/27/2012  . BRBPR (bright red blood per rectum) 07/27/2012  . Prostatitis 04/12/2012  . Constipation 03/09/2012  . Callus 08/16/2011  . Ingrown toenail 08/16/2011  . Chronic low back pain 08/16/2011  . Dyslipidemia 09/03/2010  . Herpes simplex virus (HSV) infection 07/06/2010  . Erectile dysfunction 07/06/2010  . ANXIETY DEPRESSION 05/06/2010  . BURSITIS, KNEE 05/06/2010  . Human immunodeficiency virus (HIV) disease ([place]) 04/16/2010    Past Surgical History:  Procedure Laterality Date  . ESOPHAGOGASTRODUODENOSCOPY  03/27/2012   Procedure: ESOPHAGOGASTRODUODENOSCOPY (EGD);  Surgeon: Lear Ng, MD;  Location: [facility];  Service: Endoscopy;  Laterality: N/A;  . IRRIGATION AND DEBRIDEMENT ABSCESS N/A 03/13/2015   Procedure: IRRIGATION AND DEBRIDEMENT ABSCESS;  Surgeon: Johnathan Hausen, MD;  Location: [facility];  Service: General;  Laterality: N/A;  . WISDOM TOOTH EXTRACTION          Home Medications    Prior to Admission medications   Medication Sig Start Date End Date Taking? Authorizing Provider  bismuth subsalicylate (PEPTO BISMOL) 262 MG/15ML suspension Take 30 mLs by mouth  every 6 (six) hours as needed for diarrhea or loose stools.   Yes [provider]  cephALEXin (KEFLEX) 500 MG capsule Take 500 mg by mouth 4 (four) times daily.   Yes [provider]  gabapentin (NEURONTIN) 300 MG capsule Take 1 capsule (300 mg total) by mouth 3 (three) times daily. 11/17/16  Yes Langeland, Dawn T, MD  GENVOYA 150-150-200-10 MG TABS tablet TAKE 1 TABLET BY MOUTH EVERY DAY WITH PREZISTA 07/04/17  Yes Comer, Okey Regal,  MD  pravastatin (PRAVACHOL) 40 MG tablet TAKE 1 TABLET(40 MG) BY MOUTH DAILY 09/08/17  Yes Ladell Pier, MD  PREZISTA 800 MG tablet TAKE 1 TABLET BY MOUTH EVERY DAY 07/04/17  Yes Comer, Okey Regal, MD  ranitidine (ZANTAC) 150 MG tablet TAKE 1 TO 2 TABLETS BY MOUTH TWICE DAILY 12/15/17  Yes Pyrtle, Lajuan Lines, MD  sildenafil (VIAGRA) 50 MG tablet Take 1 tablet (50 mg total) by mouth daily as needed for erectile dysfunction. 01/03/17  Yes Tresa Garter, MD  traZODone (DESYREL) 100 MG tablet Take 300 mg by mouth at bedtime.  07/24/14  Yes [provider]  valACYclovir (VALTREX) 500 MG tablet TAKE 1 TABLET BY MOUTH TWICE DAILY AS NEEDED FOR FLARE UPS 09/20/16  Yes Comer, Okey Regal, MD  acetaminophen-codeine (TYLENOL #3) 300-30 MG tablet Take 1 tablet every 4 (four) hours as needed by mouth. Patient not taking: Reported on 01/04/2018 05/02/17   Tresa Garter, MD  benzonatate (TESSALON) 100 MG capsule Take 1 capsule (100 mg total) by mouth 2 (two) times daily as needed for cough. Patient not taking: Reported on 01/04/2018 09/01/17   Ladell Pier, MD  cyclobenzaprine (FLEXERIL) 10 MG tablet Take 1 tablet (10 mg total) by mouth daily as needed for muscle spasms. Patient not taking: Reported on 01/04/2018 11/16/17   Ladell Pier, MD  diclofenac sodium (VOLTAREN) 1 % GEL Apply 2 g 4 (four) times daily topically. Patient not taking: Reported on 01/04/2018 05/02/17   Tresa Garter, MD  methocarbamol (ROBAXIN) 500 MG tablet Take 1 tablet (500 mg total) by mouth every 8 (eight) hours as needed for muscle spasms. Patient not taking: Reported on 01/04/2018 07/25/17   Petrucelli, Aldona Bar R, PA-C  naproxen (NAPROSYN) 500 MG tablet Take 1 tablet (500 mg total) by mouth 2 (two) times daily. Patient not taking: Reported on 01/04/2018 12/18/17   Horton, Barbette Hair, MD    Family History Family History  Problem Relation Age of Onset  . Cancer Mother        laryngeal  . Pneumonia Father   .  Diabetes Father   . Hypertension Sister   . Crohn's disease Brother   . Crohn's disease Maternal Grandmother   . Cancer Maternal Grandmother        patient unsure of type  . Colon cancer Maternal Grandfather     Social History Social History   Tobacco Use  . Smoking status: Current Every Day Smoker    Packs/day: 0.50    Types: Cigarettes  . Smokeless tobacco: Never Used  Substance Use Topics  . Alcohol use: Yes    Alcohol/week: 1.2 oz    Types: 2 Standard drinks or equivalent per week    Comment: beer at times. not often  . Drug use: Yes    Frequency: 7.0 times per week    Types: Marijuana    Comment: occ     Allergies   Oxycodone   Review of Systems Review of Systems  Constitutional: Positive for activity change.  Gastrointestinal: Positive for blood in stool and diarrhea.  Skin: Negative for wound.  Allergic/Immunologic: Positive for immunocompromised state.     Physical Exam Updated Vital Signs BP 108/60   Pulse 60   Temp 98.3 F (36.8 C) (Oral)   Resp 18   Ht 5\' 8"  (1.727 m)   Wt 94.5 kg (208 lb 4 oz)   SpO2 97%   BMI 31.66 kg/m   Physical Exam  Constitutional: He is oriented to person, place, and time. He appears well-developed.  HENT:  Head: Atraumatic.  Neck: Neck supple.  Cardiovascular: Normal rate.  Pulmonary/Chest: Effort normal.  Abdominal: Soft. There is generalized tenderness. There is no rebound and no guarding.  Neurological: He is alert and oriented to person, place, and time.  Skin: Skin is warm.  Nursing note and vitals reviewed.    ED Treatments / Results  Labs (all labs ordered are listed, but only abnormal results are displayed) Labs Reviewed  COMPREHENSIVE METABOLIC PANEL - Abnormal; Notable for the following components:      Result Value   Creatinine, Ser 1.33 (*)    All other components within normal limits  GASTROINTESTINAL PANEL BY PCR, STOOL (REPLACES STOOL CULTURE)  CBC  MAGNESIUM  POC OCCULT BLOOD, ED  TYPE  AND SCREEN  ABO/RH    EKG None  Radiology No results found.  Procedures Procedures (including critical care time)  Medications Ordered in ED Medications  lactated ringers bolus 1,000 mL (0 mLs Intravenous Stopped 01/04/18 1114)  ondansetron (ZOFRAN) injection 4 mg (4 mg Intravenous Given 01/04/18 0851)  HYDROcodone-acetaminophen (NORCO/VICODIN) 5-325 MG per tablet 1 tablet (1 tablet Oral Given 01/04/18 0851)     Initial Impression / Assessment and Plan / ED Course  I have reviewed the triage vital signs and the nursing notes.  Pertinent labs & imaging results that were available during my care of the patient were reviewed by me and considered in my medical decision making (see chart for details).  Clinical Course as of Jan 05 1124  Wed Jan 04, 2018  1123 Patient's white count is normal.  Hemoglobin is also over 16.  No evidence of severe dehydration. We have observed patient now for 4 hours and he has been unable to provide [place] with stool specimen.  Given the labs look normal, patient's diarrhea is not profuse -this is unlikely to be C. difficile colitis.  We have advised patient to follow-up with infectious disease doctors next week.  Strict ER return precautions have been discussed with the patient and is comfortable with the plan.  WBC: 6.6 [AN]    Clinical Course User Index [AN] Varney Biles, MD   42 year old male comes in with chief complaint of abdominal pain and rectal bleeding.  He has history of HIV, which appears to be well controlled and patient is compliant with his medications.  He also has history of internal hemorrhoids, most recently patient was on Keflex because of finger laceration/contaminated wound.  Differential diagnosis for his condition includes C. difficile colitis, enterocolitis that is bacterial nature or viral, internal hemorrhoids.  Given that there is abdominal discomfort, internal hemorrhoids becomes a low in the differential, unless that he has 2  separate processes going on.  Stool studies have been ordered because he is immunocompromised. Abdominal exam is not peritoneal and CT scan is not indicated at this time.  Finger exam does not reveal any evidence of infection, there is some ashen appearing margins around the laceration  repair, with no evidence of abscess.  Final Clinical Impressions(s) / ED Diagnoses   Final diagnoses:  Lower GI bleed  Generalized abdominal pain    ED Discharge Orders    None       Varney Biles, MD 01/04/18 1125

## 2018-01-04 NOTE — ED Triage Notes (Signed)
Patient c/o diarrhea with bright red and dark blood in his stool x 3 days. Patient c/o mid abdominal cramping x 2 days.  Patient had a finger laceration approx 10 days and was sutured. Patient states he hit his right ring finger again and the area opened back up.

## 2018-01-04 NOTE — Telephone Encounter (Signed)
-----   Message from [place] sent at 01/04/2018 12:09 PM EDT ----- Patient wants to speak to a nurse on regards to recent ED visit.

## 2018-01-04 NOTE — Telephone Encounter (Signed)
Pt scheduled to see Ellouise Newer PA 01/11/18@2 :45pm. Please notify pt of appt.

## 2018-01-04 NOTE — Telephone Encounter (Signed)
Patient in ED today, notes ask him to follow up at infectious disease next week. Patient will come 7.15 at 10:30. Landis Gandy, RN

## 2018-01-04 NOTE — Discharge Instructions (Addendum)
We saw you in the ER for abdominal pain and bloody stools. The results in the ER are within normal limits.  Given your past medical history however it is prudent that you follow-up with the infectious disease doctor.   Return to the ER if the bleeding gets worse, and you start having dizziness or shortness of breath. Also return to the ER immediately if you start having fevers, chills, worsening pain or worsening diarrhea.

## 2018-01-09 ENCOUNTER — Ambulatory Visit (INDEPENDENT_AMBULATORY_CARE_PROVIDER_SITE_OTHER): Payer: Self-pay | Admitting: Internal Medicine

## 2018-01-09 ENCOUNTER — Encounter: Payer: Self-pay | Admitting: Internal Medicine

## 2018-01-09 ENCOUNTER — Other Ambulatory Visit: Payer: Self-pay

## 2018-01-09 VITALS — BP 119/79 | HR 73 | Temp 98.5°F | Ht 68.0 in | Wt 208.0 lb

## 2018-01-09 DIAGNOSIS — Z23 Encounter for immunization: Secondary | ICD-10-CM

## 2018-01-09 DIAGNOSIS — B20 Human immunodeficiency virus [HIV] disease: Secondary | ICD-10-CM

## 2018-01-09 DIAGNOSIS — S61219A Laceration without foreign body of unspecified finger without damage to nail, initial encounter: Secondary | ICD-10-CM | POA: Insufficient documentation

## 2018-01-09 DIAGNOSIS — Z113 Encounter for screening for infections with a predominantly sexual mode of transmission: Secondary | ICD-10-CM

## 2018-01-09 DIAGNOSIS — B009 Herpesviral infection, unspecified: Secondary | ICD-10-CM

## 2018-01-09 DIAGNOSIS — S61215A Laceration without foreign body of left ring finger without damage to nail, initial encounter: Secondary | ICD-10-CM

## 2018-01-09 MED ORDER — VALACYCLOVIR HCL 500 MG PO TABS
ORAL_TABLET | ORAL | 5 refills | Status: DC
Start: 2018-01-09 — End: 2019-01-31

## 2018-01-09 NOTE — Assessment & Plan Note (Signed)
Doing well.  Will check his labs today a little early and can rtc in 6 months.

## 2018-01-09 NOTE — Progress Notes (Signed)
   Subjective:    Patient ID: Shawn Brooks, male    DOB: June 01, 1976, 42 y.o.   MRN: [id number]  HPI Here for a work in visit after an ED visit.   He went to the ED 01/04/18 with a complaint of bloody diarrhea for several days and GI pathogen panel was ordered but never sent.  His diarrhea is resolving.  He also recently had a cut of his finger requiring stiches.  During work he had again bumped his finger after the stiches were removed and it opened up again and bled.  Otherwise no issues.  Takes his Genvoya and Prezista and no missed doses.     Review of Systems  Constitutional: Negative for fatigue.  Gastrointestinal: Negative for diarrhea.  Skin: Negative for rash.       Objective:   Physical Exam  Constitutional: He appears well-developed and well-nourished. No distress.  HENT:  Mouth/Throat: No oropharyngeal exudate.  Eyes: No scleral icterus.  Cardiovascular: Normal rate, regular rhythm and normal heart sounds.  No murmur heard. Pulmonary/Chest: Effort normal and breath sounds normal. No respiratory distress.  Musculoskeletal:  Left fourth finger with an open area, no surrounding erythema, no warmth.    Skin: No rash noted.    SH: + tobacco      Assessment & Plan:

## 2018-01-09 NOTE — Assessment & Plan Note (Signed)
Will screen today 

## 2018-01-09 NOTE — Assessment & Plan Note (Addendum)
Seems to be healing now, though some exposed fat.  Suggested to get stiches again but should heal on its own either way so he has opted to wait and will go back for evaluation if it does not heal.

## 2018-01-10 LAB — T-HELPER CELL (CD4) - (RCID CLINIC ONLY)
CD4 T CELL HELPER: 33 % (ref 33–55)
CD4 T Cell Abs: 850 /uL (ref 400–2700)

## 2018-01-11 ENCOUNTER — Encounter: Payer: Self-pay | Admitting: Internal Medicine

## 2018-01-11 ENCOUNTER — Ambulatory Visit (INDEPENDENT_AMBULATORY_CARE_PROVIDER_SITE_OTHER): Payer: Self-pay | Admitting: Physician Assistant

## 2018-01-11 ENCOUNTER — Other Ambulatory Visit: Payer: Self-pay

## 2018-01-11 ENCOUNTER — Encounter: Payer: Self-pay | Admitting: Physician Assistant

## 2018-01-11 VITALS — BP 100/60 | HR 72 | Ht 68.0 in | Wt 208.0 lb

## 2018-01-11 DIAGNOSIS — R197 Diarrhea, unspecified: Secondary | ICD-10-CM

## 2018-01-11 DIAGNOSIS — Z8619 Personal history of other infectious and parasitic diseases: Secondary | ICD-10-CM

## 2018-01-11 DIAGNOSIS — K625 Hemorrhage of anus and rectum: Secondary | ICD-10-CM

## 2018-01-11 LAB — HIV-1 RNA QUANT-NO REFLEX-BLD
HIV 1 RNA QUANT: NOT DETECTED {copies}/mL
HIV-1 RNA Quant, Log: <1.3 Log copies/mL

## 2018-01-11 MED ORDER — SILDENAFIL CITRATE 50 MG PO TABS: ORAL_TABLET | ORAL | 3 refills | Status: DC

## 2018-01-11 NOTE — Patient Instructions (Signed)
We are setting you up for a hemorrhoid banding appointment and giving you information to read about this procedure.

## 2018-01-11 NOTE — Progress Notes (Addendum)
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 Johney Brooks, history of diverticulosis and internal hemorrhoids, known to Dr. Hilarie Brooks, who was referred to me by Shawn Pier, MD for a complaint of diarrhea and hematochezia.      09/15/2016 office visit Dr. Hilarie Brooks discussed red blood in his stools for the past 2 months.  Also upper abdominal pain.  Rectal bleeding was discussed in setting of hemorrhoids and history of anal condyloma with AIN.  He had a full colonoscopy in 2015 without polyps or colitis.  Hemorrhoids were seen.  Due to increased bleeding is recommended he have a repeat flex sig at that time and possibly consider hemorrhoidal banding.  Dyspepsia with epigastric pain as discussed and patient was continued on PPI therapy.  Also repeat EGD recommended.    10/20/2016 flex sig with normal sigmoid and rectum, nodule in the distal rectum, internal hemorrhoids.  Path showed lymphoid aggregates.  EGD at the same time was normal with a small hiatal hernia.    01/04/2018 ED visit for diarrhea and bloody stools to me by abdominal pain.  Labs including CMP, gastrointestinal panel, CBC and occult blood were negative/normal.    Today, explains that for a week he had multiple loose bowel movements a day accompanied by abdominal pain and bloody stool.  He tells me that 90% of his stools were bloody and he had multiple bowel movements a day.  This stopped on its own a week ago.  He has not seen any further blood and had no further diarrhea.  His stools have returned to normal and he has no further abdominal pain. Doing well.    Does tell me that he has a chronic rectal bleeding which has been evaluated multiple times in the past but " has never been fixed".  Explains that he was supposed to have hemorrhoid banding  arranged with Dr. Hilarie Brooks but never received a phone call.    Denies fever, chills, weight loss, anorexia, nausea, vomiting, abdominal or rectal pain.  Past Medical History:  Diagnosis Date  . Anxiety   . Blood dyscrasia    HIV  . Chronic back pain   . Depression   . DJD (degenerative joint disease)   . Gastric ulcer   . Headache(784.0)   . HIV infection ([place])   . Hyperplastic colon polyp   . Hypertension     Past Surgical History:  Procedure Laterality Date  . ESOPHAGOGASTRODUODENOSCOPY  03/27/2012   Procedure: ESOPHAGOGASTRODUODENOSCOPY (EGD);  Surgeon: Lear Ng, MD;  Location: [facility];  Service: Endoscopy;  Laterality: N/A;  . IRRIGATION AND DEBRIDEMENT ABSCESS N/A 03/13/2015   Procedure: IRRIGATION AND DEBRIDEMENT ABSCESS;  Surgeon: Johnathan Hausen, MD;  Location: [facility];  Service: General;  Laterality: N/A;  . WISDOM TOOTH EXTRACTION      Current Outpatient Medications  Medication Sig Dispense Refill  . gabapentin (NEURONTIN) 300 MG capsule Take 1 capsule (300 mg total) by mouth 3 (three) times daily. 90 capsule 2  . GENVOYA 150-150-200-10 MG TABS tablet TAKE 1 TABLET BY MOUTH EVERY DAY WITH PREZISTA 30 tablet 5  . pravastatin (PRAVACHOL) 40 MG tablet TAKE 1 TABLET(40 MG) BY MOUTH DAILY 90 tablet 0  . PREZISTA 800 MG tablet TAKE 1 TABLET BY MOUTH EVERY DAY 30 tablet 5  . ranitidine (ZANTAC) 150 MG  tablet TAKE 1 TO 2 TABLETS BY MOUTH TWICE DAILY 120 tablet 3  . sildenafil (VIAGRA) 50 MG tablet Take 1 tablet (50 mg total) by mouth daily as needed for erectile dysfunction. 30 tablet 3  . traZODone (DESYREL) 100 MG tablet Take 50 mg by mouth at bedtime.   2  . valACYclovir (VALTREX) 500 MG tablet TAKE 1 TABLET BY MOUTH TWICE DAILY AS NEEDED FOR FLARE UPS 60 tablet 5   No current facility-administered medications for this visit.     Allergies as of 01/11/2018 - Review Complete 01/11/2018  Allergen Reaction Noted  . Oxycodone Itching 07/25/2017    Family  History  Problem Relation Age of Onset  . Cancer Mother        laryngeal  . Pneumonia Father   . Diabetes Father   . Hypertension Sister   . Crohn's disease Brother   . Crohn's disease Maternal Grandmother   . Cancer Maternal Grandmother        patient unsure of type  . Colon cancer Maternal Grandfather     Social History   Socioeconomic History  . Marital status: Divorced    Spouse name: Not on file  . Number of children: 1  . Years of education: Not on file  . Highest education level: Not on file  Occupational History  . Occupation: Disabled  Social Needs  . Financial resource strain: Not on file  . Food insecurity:    Worry: Not on file    Inability: Not on file  . Transportation needs:    Medical: Not on file    Non-medical: Not on file  Tobacco Use  . Smoking status: Current Every Day Smoker    Packs/day: 0.75    Types: Cigarettes  . Smokeless tobacco: Never Used  . Tobacco comment: not ready to quit  Substance and Sexual Activity  . Alcohol use: Yes    Alcohol/week: 1.2 oz    Types: 2 Standard drinks or equivalent per week    Comment: beer at times. not often  . Drug use: Yes    Frequency: 7.0 times per week    Types: Marijuana    Comment: occ  . Sexual activity: Not Currently    Partners: Male    Comment: accepted condoms  Lifestyle  . Physical activity:    Days per week: Not on file    Minutes per session: Not on file  . Stress: Not on file  Relationships  . Social connections:    Talks on phone: Not on file    Gets together: Not on file    Attends religious service: Not on file    Active member of club or organization: Not on file    Attends meetings of clubs or organizations: Not on file    Relationship status: Not on file  . Intimate partner violence:    Fear of current or ex partner: Not on file    Emotionally abused: Not on file    Physically abused: Not on file    Forced sexual activity: Not on file  Other Topics Concern  . Not on  file  Social History Narrative  . Not on file    Review of Systems:    Constitutional: No weight loss, fever or chills Cardiovascular: No chest pain Respiratory: No SOB  Gastrointestinal: See HPI and otherwise negative   Physical Exam:  Vital signs: Ht 5\' 8"  (1.727 m)   Wt 208 lb (94.3 kg)   BMI 31.63 kg/m  Constitutional:   Pleasant AA male appears to be in NAD, Well developed, Well nourished, alert and cooperative Respiratory: Respirations even and unlabored. Lungs clear to auscultation bilaterally.   No wheezes, crackles, or rhonchi.  Cardiovascular: Normal S1, S2. No MRG. Regular rate and rhythm. No peripheral edema, cyanosis or pallor.  Gastrointestinal:  Soft, nondistended, nontender. No rebound or guarding. Normal bowel sounds. No appreciable masses or hepatomegaly. Rectal:  Declined Psychiatric: Demonstrates good judgement and reason without abnormal affect or behaviors.  RELEVANT LABS AND IMAGING: CBC    Component Value Date/Time   WBC 6.6 01/04/2018 0811   RBC 5.25 01/04/2018 0811   HGB 16.5 01/04/2018 0811   HGB 15.9 12/22/2016 1005   HCT 48.3 01/04/2018 0811   HCT 46.9 12/22/2016 1005   PLT 190 01/04/2018 0811   PLT 225 12/22/2016 1005   MCV 92.0 01/04/2018 0811   MCV 89 12/22/2016 1005   MCH 31.4 01/04/2018 0811   MCHC 34.2 01/04/2018 0811   RDW 14.0 01/04/2018 0811   RDW 14.2 12/22/2016 1005   LYMPHSABS 2,413 09/20/2017 0951   LYMPHSABS 2.7 12/22/2016 1005   MONOABS 455 09/13/2016 0936   EOSABS 88 09/20/2017 0951   EOSABS 0.1 12/22/2016 1005   BASOSABS 32 09/20/2017 0951   BASOSABS 0.0 12/22/2016 1005    CMP     Component Value Date/Time   NA 138 01/04/2018 0811   NA 142 05/02/2017 1051   K 4.1 01/04/2018 0811   CL 108 01/04/2018 0811   CO2 23 01/04/2018 0811   GLUCOSE 91 01/04/2018 0811   BUN 9 01/04/2018 0811   BUN 12 05/02/2017 1051   CREATININE 1.33 (H) 01/04/2018 0811   CREATININE 1.40 (H) 09/20/2017 0951   CALCIUM 9.1 01/04/2018  0811   PROT 7.7 01/04/2018 0811   PROT 7.3 05/02/2017 1051   ALBUMIN 4.2 01/04/2018 0811   ALBUMIN 4.5 05/02/2017 1051   AST 25 01/04/2018 0811   ALT 20 01/04/2018 0811   ALKPHOS 82 01/04/2018 0811   BILITOT 0.7 01/04/2018 0811   BILITOT 0.3 05/02/2017 1051   GFRNONAA >60 01/04/2018 0811   GFRNONAA 61 09/20/2017 0951   GFRAA >60 01/04/2018 0811   GFRAA 70 09/20/2017 0951    Assessment: 1.  Rectal bleeding: History of anal condyloma with AIN in the past status post resection/ablation with Dr. Johney Brooks in the past, recent flex sig 09/2016 with internal hemorrhoids and otherwise normal, recent increase in bleeding with diarrheal stools as below; most likely hemorrhoids 2.  Diarrhea and abdominal pain: Lasted for a week, resolved on its own; likely viral gastroenteritis  Plan: 1.  At this time patient requested to be set up for hemorrhoid banding.  He also has some questions for Dr. Hilarie Brooks.  Placed patient in a follow-up appointment slot for his banding and "questions" in September. 2.  Patient to follow in clinic per recommendations from Dr. Hilarie Brooks after time of procedure.  Ellouise Newer, PA-C [facility] 01/11/2018, 2:43 PM  Cc: Shawn Pier, MD   Addendum: Reviewed and agree with management. Pyrtle, Lajuan Lines, MD

## 2018-01-12 MED FILL — $Viagra 50mg tablet: 50 | 30 days supply | Qty: 10 | Fill #0

## 2018-01-17 ENCOUNTER — Other Ambulatory Visit: Payer: Self-pay

## 2018-01-17 DIAGNOSIS — E782 Mixed hyperlipidemia: Secondary | ICD-10-CM

## 2018-01-17 MED ORDER — PRAVASTATIN SODIUM 40 MG PO TABS: ORAL_TABLET | ORAL | 0 refills | Status: DC

## 2018-01-17 NOTE — Telephone Encounter (Signed)
Pharmacy called seeking refill on pravastatin, they were told he needed appt and lipid panel for more than a one month refill. Delsa Sale took it as a Radiation protection practitioner w/ readback

## 2018-01-26 ENCOUNTER — Encounter: Payer: Self-pay | Admitting: Internal Medicine

## 2018-02-07 ENCOUNTER — Encounter: Payer: Self-pay | Admitting: Psychology

## 2018-02-10 ENCOUNTER — Other Ambulatory Visit: Payer: Self-pay | Admitting: Internal Medicine

## 2018-02-10 DIAGNOSIS — E782 Mixed hyperlipidemia: Secondary | ICD-10-CM

## 2018-02-14 MED FILL — $Viagra 50mg tablet: 50 | 30 days supply | Qty: 10 | Fill #1

## 2018-02-24 ENCOUNTER — Encounter: Payer: Self-pay | Admitting: *Deleted

## 2018-03-06 ENCOUNTER — Ambulatory Visit (INDEPENDENT_AMBULATORY_CARE_PROVIDER_SITE_OTHER): Payer: Self-pay | Admitting: Internal Medicine

## 2018-03-06 ENCOUNTER — Encounter: Payer: Self-pay | Admitting: Internal Medicine

## 2018-03-06 VITALS — BP 104/60 | HR 80 | Ht 68.0 in | Wt 207.0 lb

## 2018-03-06 DIAGNOSIS — K648 Other hemorrhoids: Secondary | ICD-10-CM

## 2018-03-06 DIAGNOSIS — K602 Anal fissure, unspecified: Secondary | ICD-10-CM

## 2018-03-06 DIAGNOSIS — K921 Melena: Secondary | ICD-10-CM

## 2018-03-06 DIAGNOSIS — K625 Hemorrhage of anus and rectum: Secondary | ICD-10-CM

## 2018-03-06 MED ORDER — RECTIV 0.4 % RE OINT: TOPICAL_OINTMENT | RECTAL | 0 refills | Status: DC

## 2018-03-06 NOTE — Patient Instructions (Addendum)
You have been scheduled for your 2nd hemorrhoidal banding on 04/07/18 at 10:45 am. _______________________________________________________________________________ We have sent a prescription to [facility] for [facility]- Apply 1 inch of ointment intra-anally every 12 hours to the first finger joint. _____________________________________________________________________________ Please purchase the following medications over the counter and take as directed: Recticare-use as needed for rectal discomfort  DO NOT TAKE VIAGRA WHILE ON THIS MEDICATION AS THESE MEDICATIONS CAN INTERACT!!!!  ____________________________________________________________________________ Dennis Bast have been scheduled for a colonoscopy. Please follow written instructions given to you at your visit today.  Please pick up your prep supplies at the pharmacy within the next 1-3 days. If you use inhalers (even only as needed), please bring them with you on the day of your procedure. Your physician has requested that you go to www.startemmi.com and enter the access code given to you at your visit today. This web site gives a general overview about your procedure. However, you should still follow specific instructions given to you by our office regarding your preparation for the procedure. _______________________________________________________________________________ HEMORRHOID BANDING PROCEDURE    FOLLOW-UP CARE   1. The procedure you have had should have been relatively painless since the banding of the area involved does not have nerve endings and there is no pain sensation.  The rubber band cuts off the blood supply to the hemorrhoid and the band may fall off as soon as 48 hours after the banding (the band may occasionally be seen in the toilet bowl following a bowel movement). You may notice a temporary feeling of fullness in the rectum which should respond adequately to plain Tylenol or Motrin.  2. Following the  banding, avoid strenuous exercise that evening and resume full activity the next day.  A sitz bath (soaking in a warm tub) or bidet is soothing, and can be useful for cleansing the area after bowel movements.     3. To avoid constipation, take two tablespoons of natural wheat bran, natural oat bran, flax, Benefiber or any over the counter fiber supplement and increase your water intake to 7-8 glasses daily.    4. Unless you have been prescribed anorectal medication, do not put anything inside your rectum for two weeks: No suppositories, enemas, fingers, etc.  5. Occasionally, you may have more bleeding than usual after the banding procedure.  This is often from the untreated hemorrhoids rather than the treated one.  Don't be concerned if there is a tablespoon or so of blood.  If there is more blood than this, lie flat with your bottom higher than your head and apply an ice pack to the area. If the bleeding does not stop within a half an hour or if you feel faint, call our office at [phone] or go to the emergency room.  6. Problems are not common; however, if there is a substantial amount of bleeding, severe pain, chills, fever or difficulty passing urine (very rare) or other problems, you should call us at [phone] or report to the nearest emergency room.  7. Do not stay seated continuously for more than 2-3 hours for a day or two after the procedure.  Tighten your buttock muscles 10-15 times every two hours and take 10-15 deep breaths every 1-2 hours.  Do not spend more than a few minutes on the toilet if you cannot empty your bowel; instead re-visit the toilet at a later time. _______________________________________________________________________________ If you are age 31 or older, your body mass index should be between 23-30. Your Body  mass index is 31.47 kg/m. If this is out of the aforementioned range listed, please consider follow up with your Primary Care Provider.  If you are  age 59 or younger, your body mass index should be between 19-25. Your Body mass index is 31.47 kg/m. If this is out of the aformentioned range listed, please consider follow up with your Primary Care Provider.

## 2018-03-06 NOTE — Progress Notes (Signed)
Subjective:    Patient ID: Shawn Brooks, male    DOB: February 15, 1976, 42 y.o.   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  He was last seen in the office on 01/11/2018 by Ellouise Newer, PA-C.  He has continued to have rectal bleeding and he states that this can be with or without stool.  Bowel movements are usually once a day occasionally twice per day.  Stool can vary from normal to hard and at other times loose.  He can see blood in the stool, blood with wiping, blood dripping in the toilet, or fairly large volume hematochezia.  In July he had an acute what sounds like viral illness with bloody diarrhea that lasted about 5 days.  The bloody diarrhea has resolved.  He also is dealing with anal pain with passing stool and at times lower abdominal discomfort.  He had a flexible sigmoidoscopy on 10/20/2016 which was normal in the rectum and sigmoid.  There was a distal rectal nodule biopsied and benign lymphoid aggregate.  Internal hemorrhoids were seen.  Epigastric pain with dyspepsia has improved with his ranitidine which he uses 150 mg once to twice daily.  More remotely he had a complete colonoscopy which was performed on 05/27/2014 to evaluate personal history of colon polyps and history of AIN treated at [facility].  This revealed 3 polyps in the transverse and sigmoid colon which were removed.  The exam was otherwise normal.  Internal hemorrhoids were found at that time.  These polyps were hyperplastic.  No adenomatous change.  Review of Systems As per HPI, otherwise negative  Current Medications, Allergies, Past Medical History, Past Surgical History, Family History and Social History were reviewed in [employer] record.     Objective:   Physical Exam BP 104/60    Pulse 80   Ht 5\' 8"  (1.727 m)   Wt 207 lb (93.9 kg)   BMI 31.47 kg/m  Constitutional: Well-developed and well-nourished. No distress. HEENT: Normocephalic and atraumatic.   No scleral icterus. Neck: Neck supple. Trachea midline. Cardiovascular: Normal rate, regular rhythm and intact distal pulses. No M/R/G Pulmonary/chest: Effort normal and breath sounds normal. No wheezing, rales or rhonchi. Abdominal: Soft, nontender, nondistended. Bowel sounds active throughout.  Rectal: External lesions or perianal rash, tenderness with palpation, no rectal masses Anoscopy was performed with the patient in the left lateral decubitus position while a chaperone was present and revealed posterior anal fissure and internal hemorrhoids in the right anterior and left lateral position greater than right posterior Extremities: no clubbing, cyanosis, or edema Neurological: Alert and oriented to person place and time. Skin: Skin is warm and dry. Psychiatric: Normal mood and affect. Behavior is normal.      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   1.  Rectal bleeding/hematochezia/anal fissure/internal hemorrhoids --he has 2 definitive reasons for intermittent rectal bleeding including anal fissure and internal hemorrhoids.  The tenderness with rectal examination and anoscopy plus anal pain with passing stool is related to fissure.  We will treat this as below.  We also discussed hemorrhoidal banding, as we have in the past, and he wishes to proceed.  We did discuss the risk, benefits and alternatives.  I am concerned that with his intermittent abdominal pain but also higher volume rectal bleeding that this is not explained by internal hemorrhoids alone.  He also had bloody diarrhea several months ago which was unexplained.   After this discussion I have recommended repeat full colonoscopy.  We discussed the risk, benefits and alternatives and he is agreeable and wishes to proceed  For the anal fissure I would like him to begin Rectiv 1 inch twice daily for 4 to 6 weeks for the anal fissure.  Prescription provided because I do not think he would have assistance paying for compounded nitroglycerin or diltiazem gel.  RectiCare recommended to mix with fissure treatment for pain control.  This can be used per box instruction.   PROCEDURE NOTE:  The patient presents with symptomatic grade 2 internal hemorrhoids, requesting rubber band ligation of his hemorrhoidal disease.  All risks, benefits and alternative forms of therapy were described and informed consent was obtained.   The anorectum was pre-medicated with 0.125% nitroglycerin ointment and RectiCare The decision was made to band the RA internal hemorrhoid, and the [place] was used to perform band ligation without complication.   Digital anorectal examination was then performed to assure proper positioning of the band, and to adjust the banded tissue as required.  The patient was discharged home without pain or other issues.  Dietary and behavioral recommendations were given and along with follow-up instructions.    The patient will return as scheduled for follow-up and possible additional banding as required. No complications were encountered and the patient tolerated the procedure well.

## 2018-03-07 ENCOUNTER — Ambulatory Visit (INDEPENDENT_AMBULATORY_CARE_PROVIDER_SITE_OTHER): Payer: Self-pay | Admitting: Psychology

## 2018-03-07 ENCOUNTER — Encounter: Payer: Self-pay | Admitting: Psychology

## 2018-03-07 DIAGNOSIS — R413 Other amnesia: Secondary | ICD-10-CM
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
## 2018-03-09 ENCOUNTER — Other Ambulatory Visit: Payer: Self-pay

## 2018-03-13 ENCOUNTER — Other Ambulatory Visit: Payer: Self-pay | Admitting: Internal Medicine

## 2018-03-13 ENCOUNTER — Telehealth: Payer: Self-pay | Admitting: Internal Medicine

## 2018-03-13 DIAGNOSIS — E782 Mixed hyperlipidemia: Secondary | ICD-10-CM

## 2018-03-13 MED ORDER — DILTIAZEM GEL 2 %: CUTANEOUS | 0 refills | Status: DC

## 2018-03-13 NOTE — Telephone Encounter (Signed)
I have spoken to patient and we will send diltiazem instead. This was Dr Vena Rua first choice.

## 2018-03-13 NOTE — Telephone Encounter (Signed)
Pt called to request a medication refill on -pravastatin (PRAVACHOL) 40 MG tablet  Please follow up to -[place], [place]

## 2018-03-14 ENCOUNTER — Other Ambulatory Visit: Payer: Self-pay | Admitting: Internal Medicine

## 2018-03-14 DIAGNOSIS — E782 Mixed hyperlipidemia: Secondary | ICD-10-CM

## 2018-03-15 MED ORDER — PRAVASTATIN SODIUM 40 MG PO TABS: ORAL_TABLET | ORAL | 6 refills | Status: DC

## 2018-03-16 ENCOUNTER — Ambulatory Visit: Payer: Self-pay

## 2018-03-20 ENCOUNTER — Encounter: Payer: Self-pay | Admitting: Psychology

## 2018-03-21 ENCOUNTER — Encounter: Payer: Self-pay | Admitting: Psychology

## 2018-03-21 ENCOUNTER — Ambulatory Visit: Payer: Self-pay | Admitting: Psychology

## 2018-03-21 DIAGNOSIS — R413 Other amnesia: Secondary | ICD-10-CM

## 2018-03-21 NOTE — Progress Notes (Signed)
   Neuropsychology Note  Shawn Brooks completed 150 minutes of neuropsychological testing with technician, Milana Kidney, BS, under the supervision of Dr. Macarthur Critchley, Licensed Psychologist. The patient did not appear overtly distressed by the testing session, per behavioral observation or via self-report to the technician. Rest breaks were offered.   Clinical Decision Making: In considering the patient's current level of functioning, level of presumed impairment, nature of symptoms, emotional and behavioral responses during the interview, level of literacy, and observed level of motivation/effort, a battery of tests was selected and communicated to the psychometrician.  Communication between the psychologist and technician was ongoing throughout the testing session and changes were made as deemed necessary based on patient performance on testing, technician observations and additional pertinent factors such as those listed above.  Shawn Brooks will return within approximately 2 weeks for an interactive feedback session with Dr. Si Raider at which time his test performances, clinical impressions and treatment recommendations will be reviewed in detail. The patient understands he can contact our office should he require our assistance before this time.  35 minutes spent performing neuropsychological evaluation services/clinical decision making (psychologist). [CPT 33435] 150 minutes spent face-to-face with patient administering standardized tests, 60 minutes spent scoring (technician). [CPT [id number], 68616]  Full report to follow.

## 2018-03-23 ENCOUNTER — Encounter: Payer: Self-pay | Admitting: Internal Medicine

## 2018-03-26 NOTE — Progress Notes (Signed)
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 recommendations are offered:  1. I feel he could benefit from counseling. In the past he didn't find it helpful but he may need to try a different therapist or find a better patient-therapist fit. 2. I recommend he follow up with his psychiatrist. I suspect he may need to be on an antidepressant or a mood stabilizer again. 3. He was reassured there is no sign of a neurocognitive disorder. These test results provide a nice baseline for future comparison if ever needed.   Feedback to Patient: Kaelon BARTON WANT returned for a feedback appointment on 03/28/2018 to review the results of his neuropsychological evaluation with this provider. 15 minutes face-to-face time was spent reviewing his test results, my impressions and my recommendations as detailed above.    Total time spent on this patient's case: 95 minutes for neurobehavioral status exam with psychologist (CPT code [phone], [phone] unit); 210 minutes of testing/scoring by psychometrician under psychologist's supervision (CPT codes [phone], [phone] units); 220 minutes for integration of patient data,  interpretation of  standardized test results and clinical data, clinical decision making, treatment planning and preparation of this report, and interactive feedback with review of results to the patient/family by psychologist (CPT codes [phone], [phone] units).      Thank you for your referral of [facility]. Please feel free to contact me if you have any questions or concerns regarding this report.

## 2018-03-28 ENCOUNTER — Ambulatory Visit (INDEPENDENT_AMBULATORY_CARE_PROVIDER_SITE_OTHER): Payer: Self-pay | Admitting: Psychology

## 2018-03-28 ENCOUNTER — Encounter: Payer: Self-pay | Admitting: Psychology

## 2018-03-28 DIAGNOSIS — R413 Other amnesia: Secondary | ICD-10-CM

## 2018-03-28 NOTE — Patient Instructions (Signed)
Fortunately, results of cognitive testing were entirely within normal limits. There was no indication of a neurocognitive disorder.   Psychological testing demonstrated significant depression and anxiety.   It is most likely the case that your cognitive symptoms in daily life are due to depression and anxiety. Chronic pain can also affect cognitive functioning.   At this point I would recommend following back up with your psychiatrist (and sharing these results with him/her). I would also consider trying a new therapist; the patient-therapist fit has to be right for therapy to be effective.  The current test results will provide a nice baseline in case you ever need re-evaluation in the future.    The effect of depression and anxiety on your cognitive functioning: . One of the typical symptoms of depression is difficulty concentrating and making decisions, and various types of anxiety also interfere with attention and concentration . Problems with attention and concentration can disrupt the process of learning and making new memories, which can make it seem like there is a problem with your memory. In your daily life, you may experience this disruption as forgetting names and appointments, misplacing items, and needing to make lists for shopping and errands. It may be harder for you to stay focused on tasks and feel as "sharp" as you did in the past.  . Also, when we are depressed or anxious, we often pay more attention to our difficulties (rather than our strengths) in our daily life, and this can make it seem to [place] like we are doing worse cognitively than we really are. . The cognitive aspects of depression and anxiety are sometimes observed as an identifiable pattern of poor performance on a neuropsychological evaluation, but it is also possible that all scores on an evaluation are within normal limits. . Regardless of the test scores, distress related to depression and anxiety can interfere with  the ability to make use of your cognitive resources and function optimally across settings such as work or school, maintaining the home and responsibilities, and personal relationships. . Fortunately, there are treatments for depression and anxiety, and when mood improves, cognitive functioning in daily life often improves. . Treatment options include psychotherapy, medications (e.g., antidepressants), and behavioral changes, such as increasing your involvement in enjoyable activities, increasing the amount of exercise you are getting, and maintaining a regular routine.

## 2018-04-03 ENCOUNTER — Ambulatory Visit: Payer: Self-pay | Attending: Internal Medicine | Admitting: Internal Medicine

## 2018-04-03 ENCOUNTER — Encounter: Payer: Self-pay | Admitting: Internal Medicine

## 2018-04-03 ENCOUNTER — Other Ambulatory Visit: Payer: Self-pay | Admitting: Neurology

## 2018-04-03 VITALS — BP 115/70 | HR 71 | Temp 98.3°F | Resp 16 | Ht 68.0 in | Wt 206.2 lb

## 2018-04-03 DIAGNOSIS — F329 Major depressive disorder, single episode, unspecified: Secondary | ICD-10-CM | POA: Insufficient documentation

## 2018-04-03 DIAGNOSIS — M79673 Pain in unspecified foot: Secondary | ICD-10-CM

## 2018-04-03 DIAGNOSIS — E559 Vitamin D deficiency, unspecified: Secondary | ICD-10-CM | POA: Insufficient documentation

## 2018-04-03 DIAGNOSIS — I129 Hypertensive chronic kidney disease with stage 1 through stage 4 chronic kidney disease, or unspecified chronic kidney disease: Secondary | ICD-10-CM | POA: Insufficient documentation

## 2018-04-03 DIAGNOSIS — E785 Hyperlipidemia, unspecified: Secondary | ICD-10-CM | POA: Insufficient documentation

## 2018-04-03 DIAGNOSIS — L84 Corns and callosities: Secondary | ICD-10-CM | POA: Insufficient documentation

## 2018-04-03 DIAGNOSIS — Z79899 Other long term (current) drug therapy: Secondary | ICD-10-CM | POA: Insufficient documentation

## 2018-04-03 DIAGNOSIS — G5603 Carpal tunnel syndrome, bilateral upper limbs: Secondary | ICD-10-CM

## 2018-04-03 DIAGNOSIS — Z885 Allergy status to narcotic agent status: Secondary | ICD-10-CM | POA: Insufficient documentation

## 2018-04-03 DIAGNOSIS — Z23 Encounter for immunization: Secondary | ICD-10-CM | POA: Insufficient documentation

## 2018-04-03 DIAGNOSIS — M79671 Pain in right foot: Secondary | ICD-10-CM | POA: Insufficient documentation

## 2018-04-03 DIAGNOSIS — M545 Low back pain: Secondary | ICD-10-CM | POA: Insufficient documentation

## 2018-04-03 DIAGNOSIS — B20 Human immunodeficiency virus [HIV] disease: Secondary | ICD-10-CM | POA: Insufficient documentation

## 2018-04-03 DIAGNOSIS — F419 Anxiety disorder, unspecified: Secondary | ICD-10-CM | POA: Insufficient documentation

## 2018-04-03 DIAGNOSIS — R7303 Prediabetes: Secondary | ICD-10-CM | POA: Insufficient documentation

## 2018-04-03 DIAGNOSIS — F32A Depression, unspecified: Secondary | ICD-10-CM

## 2018-04-03 DIAGNOSIS — F17210 Nicotine dependence, cigarettes, uncomplicated: Secondary | ICD-10-CM | POA: Insufficient documentation

## 2018-04-03 DIAGNOSIS — H538 Other visual disturbances: Secondary | ICD-10-CM | POA: Insufficient documentation

## 2018-04-03 DIAGNOSIS — N183 Chronic kidney disease, stage 3 (moderate): Secondary | ICD-10-CM | POA: Insufficient documentation

## 2018-04-03 DIAGNOSIS — G8929 Other chronic pain: Secondary | ICD-10-CM | POA: Insufficient documentation

## 2018-04-03 MED ORDER — DICLOFENAC SODIUM 1 % TD GEL: 2.0000 g | Freq: Two times a day (BID) | TRANSDERMAL | 1 refills | Status: DC | PRN

## 2018-04-03 MED ORDER — CYCLOBENZAPRINE HCL 5 MG PO TABS: 5.0000 mg | ORAL_TABLET | Freq: Every day | ORAL | 1 refills | Status: DC | PRN

## 2018-04-03 MED FILL — CYCLOBENZAPRINE 5 MG TABLET: 5 | 30 days supply | Qty: 30 | Fill #0

## 2018-04-03 MED FILL — DICLOFENAC SODIUM 1% GEL: 1 | 25 days supply | Qty: 100 | Fill #0

## 2018-04-03 NOTE — Progress Notes (Signed)
Pt. Stated his vision is blurry and he have a reader where he can see up close but he cannot see far away.   Pt. Stated his both of feet hurts.   Pt. Stated his back started to hurt.

## 2018-04-03 NOTE — Progress Notes (Signed)
Patient ID: WELDON NOURI, male    DOB: 01-24-1976  MRN: [id number]  CC: Blurred Vision and Foot Pain   Subjective: Shawn Brooks is a 42 y.o. male who presents for chronic disease management.  Last seen March 2019. His concerns today include:  HIV, DJD, HTN, HL, anxiety, tob, CKD stage 2-3, vit D def  C/o problems with vision which is been going on since before last visit.  Uses otc reading glasses but vision  getting worse.  Vision is blurry - blurred watching TV, looking at his phone and when driving.   -never had formal eye exam  Had neuropsychiatric eval since last visit due to memory issues.  Results of cognitive testing were within normal limits.  However patient reported symptoms consistent with major depression and high level of anxiety.  Assessment was that his cognitive symptoms are related to underlying psychiatric disorder.  Patient endorses feeling depressed most of the times.  He states this is been going on for years. Was seeing a psychiatrist and therapist at [facility].  Last seen about 6 mths ago.  On Trazodone and 2 other meds (he does not recall the names).  He plans to schedule a follow-up appointment  Complains of pain in both feet.  Nail of LT 5 th toe is painful when he walks and stands for long periods.. Thinks it is ingown. Worse at the end of work schedule. Also noticed a painful bump on the mid sole of the left foot.  No known injury.    Middle toes  (3rd and 4th) on RT foot for swollen and tender last wk. still a little swelling today.  C/o shooting pains in both hands and increased numbness in the right hand for over 9 months.  Seen in the ER in January of this year for same and was given wrist splints for possible carpal tunnel syndrome.  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  Has to use the wrist splint on the right hand at least about 4 times a week and on the left hand about  2-3x/wk.  Right is worse than the left.  He feels  that it is getting worse.  Patient Active Problem List   Diagnosis Date Noted  . Finger laceration 01/09/2018  . Memory problem 09/01/2017  . Vitamin D deficiency 09/01/2017  . Primary osteoarthritis of left knee 05/02/2017  . Needs flu shot 05/02/2017  . Stage 3 chronic kidney disease ([place]) 05/02/2017  . Prediabetes 12/22/2016  . Diabetes mellitus screening 11/17/2016  . Tobacco abuse 05/31/2016  . Lumbar transverse process fracture ([place]) 10/02/2015  . Screening examination for sexually transmitted disease 04/04/2014  . Seizures ([place]) 08/30/2013  . Personal history of digestive disease 01/15/2013  . Thoracic or lumbosacral neuritis or radiculitis 01/15/2013  . H/O gastrointestinal disease 01/15/2013  . Lumbar radiculopathy 01/15/2013  . 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 Condyloma acuminatum 11/17/2012  . 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 AGW (anogenital warts) 11/17/2012  . Hemorrhoid 09/27/2012  . BRBPR (bright red blood per rectum) 07/27/2012  . Prostatitis 04/12/2012  . Constipation 03/09/2012  . Callus 08/16/2011  . Ingrown toenail 08/16/2011  . Chronic low back pain 08/16/2011  . Dyslipidemia 09/03/2010  . Herpes simplex virus (HSV) infection 07/06/2010  . Erectile dysfunction 07/06/2010  . ANXIETY DEPRESSION 05/06/2010  . BURSITIS, KNEE 05/06/2010  . Human immunodeficiency virus (HIV) disease ([place]) 04/16/2010  Current Outpatient Medications on File Prior to Visit  Medication Sig Dispense Refill  . diltiazem 2 % GEL Insert intra-anally twice daily to the 1st finger joint x 6-8 weeks. 30 g 0  . gabapentin (NEURONTIN) 300 MG capsule Take 1 capsule (300 mg total) by mouth 3 (three) times daily. 90 capsule 2  . GENVOYA 150-150-200-10 MG TABS tablet TAKE 1 TABLET BY MOUTH EVERY DAY WITH PREZISTA 30 tablet 5  . pravastatin (PRAVACHOL) 40 MG tablet TAKE 1 TABLET BY MOUTH EVERY DAY 30 tablet 6  . PREZISTA 800 MG tablet TAKE 1  TABLET BY MOUTH EVERY DAY 30 tablet 5  . ranitidine (ZANTAC) 150 MG tablet TAKE 1 TO 2 TABLETS BY MOUTH TWICE DAILY 120 tablet 3  . sildenafil (VIAGRA) 50 MG tablet 1 tab po 1/2 hr prior to intercourse. Limit use to 1 tab/24 hrs 30 tablet 3  . traZODone (DESYREL) 100 MG tablet Take 50 mg by mouth at bedtime.   2  . valACYclovir (VALTREX) 500 MG tablet TAKE 1 TABLET BY MOUTH TWICE DAILY AS NEEDED FOR FLARE UPS 60 tablet 5   No current facility-administered medications on file prior to visit.     Allergies  Allergen Reactions  . Oxycodone Itching    Social History   Socioeconomic History  . Marital status: Divorced    Spouse name: Not on file  . Number of children: 1  . Years of education: Not on file  . Highest education level: Not on file  Occupational History  . Occupation: Disabled  Social Needs  . Financial resource strain: Not on file  . Food insecurity:    Worry: Not on file    Inability: Not on file  . Transportation needs:    Medical: Not on file    Non-medical: Not on file  Tobacco Use  . Smoking status: Current Every Day Smoker    Packs/day: 0.75    Types: Cigarettes  . Smokeless tobacco: Never Used  . Tobacco comment: not ready to quit  Substance and Sexual Activity  . Alcohol use: Yes    Alcohol/week: 2.0 standard drinks    Types: 2 Standard drinks or equivalent per week    Comment: beer at times. not often  . Drug use: Yes    Frequency: 7.0 times per week    Types: Marijuana    Comment: occ  . Sexual activity: Not Currently    Partners: Male    Comment: accepted condoms  Lifestyle  . Physical activity:    Days per week: Not on file    Minutes per session: Not on file  . Stress: Not on file  Relationships  . Social connections:    Talks on phone: Not on file    Gets together: Not on file    Attends religious service: Not on file    Active member of club or organization: Not on file    Attends meetings of clubs or organizations: Not on file     Relationship status: Not on file  . Intimate partner violence:    Fear of current or ex partner: Not on file    Emotionally abused: Not on file    Physically abused: Not on file    Forced sexual activity: Not on file  Other Topics Concern  . Not on file  Social History Narrative  . Not on file    Family History  Problem Relation Age of Onset  . Cancer Mother  laryngeal  . Pneumonia Father   . Diabetes Father   . Hypertension Sister   . Crohn's disease Brother   . Crohn's disease Maternal Grandmother   . Cancer Maternal Grandmother        patient unsure of type  . Colon cancer Maternal Grandfather     Past Surgical History:  Procedure Laterality Date  . ESOPHAGOGASTRODUODENOSCOPY  03/27/2012   Procedure: ESOPHAGOGASTRODUODENOSCOPY (EGD);  Surgeon: Lear Ng, MD;  Location: [facility];  Service: Endoscopy;  Laterality: N/A;  . IRRIGATION AND DEBRIDEMENT ABSCESS N/A 03/13/2015   Procedure: IRRIGATION AND DEBRIDEMENT ABSCESS;  Surgeon: Johnathan Hausen, MD;  Location: [facility];  Service: General;  Laterality: N/A;  . WISDOM TOOTH EXTRACTION      ROS: Review of Systems Negative except as above   PHYSICAL EXAM: BP 115/70 (BP Location: Left Arm, Patient Position: Sitting, Cuff Size: Normal)   Pulse 71   Temp 98.3 F (36.8 C) (Oral)   Resp 16   Ht 5\' 8"  (1.727 m)   Wt 206 lb 3.2 oz (93.5 kg)   SpO2 96%   BMI 31.35 kg/m   Physical Exam  General appearance - alert, well appearing, and in no distress Mental status - normal mood, behavior, speech, dress, motor activity, and thought processes Eyes -extraocular movement intact.  Vision using Snellen's chart is 20/20 bilaterally he does not wear corrective lenses. Neck - supple, no significant adenopathy Chest - clear to auscultation, no wheezes, rales or rhonchi, symmetric air entry Heart - normal rate, regular rhythm, normal S1, S2, no murmurs, rubs, clicks or gallops Extremities -no lower extremity  edema. MSK: 4+/5 left, 3+/5 right -effort is questionable.  Tinel's sign is negative bilaterally.  No wasting of thenar eminence.  Third and fourth toes on the right foot noted to have mild swelling questionable sausage digits Skin -left foot: toenail on 5th digit does not appear to be ingrown.  He has a small painful callus on the lateral sole of the midfoot   ASSESSMENT AND PLAN: 1. Blurred vision, bilateral Will refer to optometry.  Patient advised that if it is not covered by the orange card then he would have to pay out-of-pocket - Ambulatory referral to Optometry  2. Bilateral carpal tunnel syndrome Discussed diagnosis.  Since it is getting worse will refer for nerve conduction studies.  Recommend continued use of wrist splints at nights.  I have prescribed some Voltaren gel for him to use as needed on the wrist. - Ambulatory referral to Neurology  3. Callus of foot - Ambulatory referral to Podiatry  4. Pain of foot and toes Patient with questionable sausage digits on the right foot raising the possibility of psoriasis headache arthritis.  I was going to prescribe meloxicam, however, given his kidney function I have changed this to Voltaren gel - Ambulatory referral to Podiatry - diclofenac sodium (VOLTAREN) 1 % GEL; Apply 2 g topically 2 (two) times daily as needed.  Dispense: 100 g; Refill: 1  5. Anxiety and depression Strongly advised that he get back in with his psychiatrist and counselor at [facility].  I also requested that he bring the names and dosages of the other to psychiatric medicines that he is supposed to be taking.  6. Chronic low back pain, unspecified back pain laterality, unspecified whether sciatica present At the end of the visit patient requested a refill for Flexeril which he states he was on in past for lower back issues.  In the interest of time I told  him that we will give him a limited supply and will have to discuss it further on next visit  -  cyclobenzaprine (FLEXERIL) 5 MG tablet; Take 1 tablet (5 mg total) by mouth daily as needed for muscle spasms.  Dispense: 30 tablet; Refill: 1 - diclofenac sodium (VOLTAREN) 1 % GEL; Apply 2 g topically 2 (two) times daily as needed.  Dispense: 100 g; Refill: 1  7. Need for immunization against influenza - Flu Vaccine QUAD 36+ mos IM  Patient was given the opportunity to ask questions.  Patient verbalized understanding of the plan and was able to repeat key elements of the plan.   No orders of the defined types were placed in this encounter.    Requested Prescriptions    No prescriptions requested or ordered in this encounter    No follow-ups on file.  Karle Plumber, MD, FACP

## 2018-04-06 ENCOUNTER — Other Ambulatory Visit: Payer: Self-pay | Admitting: Internal Medicine

## 2018-04-07 ENCOUNTER — Ambulatory Visit (INDEPENDENT_AMBULATORY_CARE_PROVIDER_SITE_OTHER): Payer: Self-pay | Admitting: Internal Medicine

## 2018-04-07 ENCOUNTER — Encounter: Payer: Self-pay | Admitting: Internal Medicine

## 2018-04-07 VITALS — BP 120/74 | HR 68 | Ht 68.0 in | Wt 208.5 lb

## 2018-04-07 DIAGNOSIS — K648 Other hemorrhoids: Secondary | ICD-10-CM

## 2018-04-07 NOTE — Progress Notes (Signed)
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 On 03/06/2018 he had hemorrhoidal banding to the right anterior internal hemorrhoid for symptomatic internal hemorrhoids Symptoms prior to banding included rectal bleeding, some prolapse symptoms and discomfort I also prescribed Rectiv anal fissure she was unable to get due to expense Today he reports definitive improvement in hemorrhoidal symptoms.  No further rectal bleeding with bowel movement.  He is very happy with the result.   PROCEDURE NOTE:  The patient presents with symptomatic grade 2 internal hemorrhoids, requesting rubber band ligation of his hemorrhoidal disease.  All risks, benefits and alternative forms of therapy were described and informed consent was obtained.   The anorectum was pre-medicated with 0.125% nitroglycerin ointment The decision was made to band the LL (RA banded 03/06/18) internal hemorrhoid, and the [place] was used to perform band ligation without complication.   Digital anorectal examination was then performed to assure proper positioning of the band, and to adjust the banded tissue as required.  The patient was discharged home without pain or other issues.  Dietary and behavioral recommendations were given and along with follow-up instructions.     The patient will return as needed for follow-up and possible additional banding as required. No complications were encountered and the patient tolerated the procedure well.

## 2018-04-07 NOTE — Patient Instructions (Signed)
Please follow up with Dr Hilarie Fredrickson as needed. If you feel you need a 3rd banding, you may schedule.  HEMORRHOID BANDING PROCEDURE    FOLLOW-UP CARE   1. The procedure you have had should have been relatively painless since the banding of the area involved does not have nerve endings and there is no pain sensation.  The rubber band cuts off the blood supply to the hemorrhoid and the band may fall off as soon as 48 hours after the banding (the band may occasionally be seen in the toilet bowl following a bowel movement). You may notice a temporary feeling of fullness in the rectum which should respond adequately to plain Tylenol or Motrin.  2. Following the banding, avoid strenuous exercise that evening and resume full activity the next day.  A sitz bath (soaking in a warm tub) or bidet is soothing, and can be useful for cleansing the area after bowel movements.     3. To avoid constipation, take two tablespoons of natural wheat bran, natural oat bran, flax, Benefiber or any over the counter fiber supplement and increase your water intake to 7-8 glasses daily.    4. Unless you have been prescribed anorectal medication, do not put anything inside your rectum for two weeks: No suppositories, enemas, fingers, etc.  5. Occasionally, you may have more bleeding than usual after the banding procedure.  This is often from the untreated hemorrhoids rather than the treated one.  Don't be concerned if there is a tablespoon or so of blood.  If there is more blood than this, lie flat with your bottom higher than your head and apply an ice pack to the area. If the bleeding does not stop within a half an hour or if you feel faint, call our office at [phone] or go to the emergency room.  6. Problems are not common; however, if there is a substantial amount of bleeding, severe pain, chills, fever or difficulty passing urine (very rare) or other problems, you should call us at [phone] or report to the  nearest emergency room.  7. Do not stay seated continuously for more than 2-3 hours for a day or two after the procedure.  Tighten your buttock muscles 10-15 times every two hours and take 10-15 deep breaths every 1-2 hours.  Do not spend more than a few minutes on the toilet if you cannot empty your bowel; instead re-visit the toilet at a later time.    If you are age 54 or older, your body mass index should be between 23-30. Your Body mass index is 31.7 kg/m. If this is out of the aforementioned range listed, please consider follow up with your Primary Care Provider.  If you are age 109 or younger, your body mass index should be between 19-25. Your Body mass index is 31.7 kg/m. If this is out of the aformentioned range listed, please consider follow up with your Primary Care Provider.

## 2018-04-14 ENCOUNTER — Encounter (HOSPITAL_COMMUNITY): Payer: Self-pay | Admitting: *Deleted

## 2018-04-14 ENCOUNTER — Emergency Department (HOSPITAL_COMMUNITY)
Admission: EM | Admit: 2018-04-14 | Discharge: 2018-04-14 | Disposition: A | Discharge status: Home / Self Care | Payer: Self-pay | Attending: Emergency Medicine | Admitting: Emergency Medicine

## 2018-04-14 ENCOUNTER — Emergency Department (HOSPITAL_COMMUNITY): Payer: Self-pay

## 2018-04-14 DIAGNOSIS — I129 Hypertensive chronic kidney disease with stage 1 through stage 4 chronic kidney disease, or unspecified chronic kidney disease: Secondary | ICD-10-CM | POA: Insufficient documentation

## 2018-04-14 DIAGNOSIS — B9789 Other viral agents as the cause of diseases classified elsewhere: Secondary | ICD-10-CM

## 2018-04-14 DIAGNOSIS — J069 Acute upper respiratory infection, unspecified: Secondary | ICD-10-CM | POA: Insufficient documentation

## 2018-04-14 DIAGNOSIS — R0789 Other chest pain: Secondary | ICD-10-CM

## 2018-04-14 DIAGNOSIS — F17210 Nicotine dependence, cigarettes, uncomplicated: Secondary | ICD-10-CM | POA: Insufficient documentation

## 2018-04-14 DIAGNOSIS — N183 Chronic kidney disease, stage 3 (moderate): Secondary | ICD-10-CM | POA: Insufficient documentation

## 2018-04-14 DIAGNOSIS — Z79899 Other long term (current) drug therapy: Secondary | ICD-10-CM | POA: Insufficient documentation

## 2018-04-14 DIAGNOSIS — B20 Human immunodeficiency virus [HIV] disease: Secondary | ICD-10-CM | POA: Insufficient documentation

## 2018-04-14 LAB — BASIC METABOLIC PANEL
Anion gap: 7 (ref 5–15)
BUN: 13 mg/dL (ref 6–20)
CALCIUM: 9.4 mg/dL (ref 8.9–10.3)
CO2: 22 mmol/L (ref 22–32)
CREATININE: 1.05 mg/dL (ref 0.61–1.24)
Chloride: 107 mmol/L (ref 98–111)
GFR calc Af Amer: >60 mL/min (ref >60)
GLUCOSE: 97 mg/dL (ref 70–99)
Potassium: 3.9 mmol/L (ref 3.5–5.1)
SODIUM: 136 mmol/L (ref 135–145)

## 2018-04-14 LAB — CBC WITH DIFFERENTIAL/PLATELET
ABS IMMATURE GRANULOCYTES: 0.03 10*3/uL (ref 0.00–0.07)
BASOS PCT: 0 %
Basophils Absolute: 0 10*3/uL (ref 0.0–0.1)
EOS ABS: 0.1 10*3/uL (ref 0.0–0.5)
Eosinophils Relative: 1 %
HCT: 48.6 % (ref 39.0–52.0)
Hemoglobin: 15.7 g/dL (ref 13.0–17.0)
Immature Granulocytes: 0 %
Lymphocytes Relative: 14 %
Lymphs Abs: 1.6 10*3/uL (ref 0.7–4.0)
MCH: 29.6 pg (ref 26.0–34.0)
MCHC: 32.3 g/dL (ref 30.0–36.0)
MCV: 91.5 fL (ref 80.0–100.0)
MONO ABS: 0.8 10*3/uL (ref 0.1–1.0)
MONOS PCT: 7 %
NEUTROS ABS: 8.4 10*3/uL — AB (ref 1.7–7.7)
NEUTROS PCT: 78 %
PLATELETS: 207 10*3/uL (ref 150–400)
RBC: 5.31 MIL/uL (ref 4.22–5.81)
RDW: 13.3 % (ref 11.5–15.5)
WBC: 10.9 10*3/uL — AB (ref 4.0–10.5)
nRBC: 0 % (ref 0.0–0.2)

## 2018-04-14 LAB — I-STAT TROPONIN, ED
TROPONIN I, POC: 0 ng/mL (ref 0.00–0.08)
Troponin i, poc: 0 ng/mL (ref 0.00–0.08)

## 2018-04-14 LAB — URINALYSIS, ROUTINE W REFLEX MICROSCOPIC
Bilirubin Urine: NEGATIVE
GLUCOSE, UA: NEGATIVE mg/dL
Hgb urine dipstick: NEGATIVE
KETONES UR: NEGATIVE mg/dL
LEUKOCYTES UA: NEGATIVE
Nitrite: NEGATIVE
PROTEIN: NEGATIVE mg/dL
Specific Gravity, Urine: 1.02 (ref 1.005–1.030)
pH: 5 (ref 5.0–8.0)

## 2018-04-14 LAB — INFLUENZA PANEL BY PCR (TYPE A & B)
Influenza A By PCR: NEGATIVE
Influenza B By PCR: NEGATIVE

## 2018-04-14 LAB — GROUP A STREP BY PCR: Group A Strep by PCR: NOT DETECTED

## 2018-04-14 LAB — I-STAT CG4 LACTIC ACID, ED
LACTIC ACID, VENOUS: 1.12 mmol/L (ref 0.5–1.9)
Lactic Acid, Venous: 1.18 mmol/L (ref 0.5–1.9)

## 2018-04-14 MED ORDER — IBUPROFEN 400 MG PO TABS
600.0000 mg | ORAL_TABLET | Freq: Once | ORAL | Status: AC
  Administered 2018-04-14: 600 mg via ORAL
  Filled 2018-04-14: qty 1

## 2018-04-14 MED ORDER — ACETAMINOPHEN 500 MG PO TABS
1000.0000 mg | ORAL_TABLET | Freq: Once | ORAL | Status: AC
  Administered 2018-04-14: 1000 mg via ORAL
  Filled 2018-04-14: qty 2

## 2018-04-14 MED ORDER — METHOCARBAMOL 500 MG PO TABS
1000.0000 mg | ORAL_TABLET | Freq: Once | ORAL | Status: AC
  Administered 2018-04-14: 1000 mg via ORAL
  Filled 2018-04-14: qty 2

## 2018-04-14 NOTE — ED Triage Notes (Signed)
Pt in c/o cough, nasal congestion and body aches that started on Wednesday, no distress noted, cough is productive

## 2018-04-14 NOTE — ED Provider Notes (Signed)
[facility] EMERGENCY DEPARTMENT Provider Note   CSN: [id number] Arrival date & time: 04/14/18  0815     History   Chief Complaint Chief Complaint  Patient presents with  . URI    HPI Shawn Brooks is a 42 y.o. male with history of HIV compliant on medications with CD4 count 690 and undetectable viral load, anxiety, depression is here for evaluation of cough.  Onset 2 days ago.  Cough is persistent, productive.  Associated with intermittent chest pressure across the chest bilaterally that is worse with breathing and movement, lasting a few seconds and self resolving, this is nonexertional.  Additionally reports SOB from pain in the chest with breathing, nasal congestion, runny nose, generalized malaise, thoracic back spasms that are worse with movement, mild sore throat, chills, subjective fevers.  He got the flu shot last Monday.  He denies sick contacts.  He has no nausea, vomiting, abdominal pain, dysuria, hematuria, changes in bowel movements.  Has taken DayQuil without relief.  No alleviating factors. HPI  Past Medical History:  Diagnosis Date  . Anal condyloma   . Anxiety   . Blood dyscrasia    HIV  . Chronic back pain   . Depression   . DJD (degenerative joint disease)   . Gastric ulcer   . Headache(784.0)   . Hiatal hernia   . HIV infection ([place])   . Hyperplastic colon polyp   . Hypertension   . Internal hemorrhoids     Patient Active Problem List   Diagnosis Date Noted  . Finger laceration 01/09/2018  . Memory problem 09/01/2017  . Vitamin D deficiency 09/01/2017  . Primary osteoarthritis of left knee 05/02/2017  . Needs flu shot 05/02/2017  . Stage 3 chronic kidney disease ([place]) 05/02/2017  . Prediabetes 12/22/2016  . Diabetes mellitus screening 11/17/2016  . Tobacco abuse 05/31/2016  . Lumbar transverse process fracture ([place]) 10/02/2015  . Screening examination for sexually transmitted disease 04/04/2014  . Seizures ([place]) 08/30/2013   . Personal history of digestive disease 01/15/2013  . Thoracic or lumbosacral neuritis or radiculitis 01/15/2013  . H/O gastrointestinal disease 01/15/2013  . Lumbar radiculopathy 01/15/2013  . 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 Condyloma acuminatum 11/17/2012  . 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 AGW (anogenital warts) 11/17/2012  . Hemorrhoid 09/27/2012  . BRBPR (bright red blood per rectum) 07/27/2012  . Prostatitis 04/12/2012  . Constipation 03/09/2012  . Callus 08/16/2011  . Ingrown toenail 08/16/2011  . Chronic low back pain 08/16/2011  . Dyslipidemia 09/03/2010  . Herpes simplex virus (HSV) infection 07/06/2010  . Erectile dysfunction 07/06/2010  . ANXIETY DEPRESSION 05/06/2010  . BURSITIS, KNEE 05/06/2010  . Human immunodeficiency virus (HIV) disease ([place]) 04/16/2010    Past Surgical History:  Procedure Laterality Date  . ESOPHAGOGASTRODUODENOSCOPY  03/27/2012   Procedure: ESOPHAGOGASTRODUODENOSCOPY (EGD);  Surgeon: Lear Ng, MD;  Location: [facility];  Service: Endoscopy;  Laterality: N/A;  . IRRIGATION AND DEBRIDEMENT ABSCESS N/A 03/13/2015   Procedure: IRRIGATION AND DEBRIDEMENT ABSCESS;  Surgeon: Johnathan Hausen, MD;  Location: [facility];  Service: General;  Laterality: N/A;  . WISDOM TOOTH EXTRACTION          Home Medications    Prior to Admission medications   Medication Sig Start Date End Date Taking? Authorizing Provider  cyclobenzaprine (FLEXERIL) 5 MG tablet Take 1 tablet (5 mg total) by mouth daily as needed for muscle spasms. 04/03/18   Ladell Pier, MD  diclofenac sodium (VOLTAREN) 1 % GEL Apply 2 g topically 2 (two) times daily as needed. 04/03/18   Ladell Pier, MD  diltiazem 2 % GEL Insert intra-anally twice daily to the 1st finger joint x 6-8 weeks. 03/13/18   Pyrtle, Lajuan Lines, MD  gabapentin (NEURONTIN) 300 MG capsule Take 1 capsule (300 mg total) by mouth 3 (three) times daily. 11/17/16    Maren Reamer, MD  GENVOYA 150-150-200-10 MG TABS tablet TAKE 1 TABLET BY MOUTH EVERY DAY WITH PREZISTA 07/04/17   Thayer Headings, MD  pravastatin (PRAVACHOL) 40 MG tablet TAKE 1 TABLET BY MOUTH EVERY DAY 03/15/18   Ladell Pier, MD  PREZISTA 800 MG tablet TAKE 1 TABLET BY MOUTH EVERY DAY 07/04/17   Comer, Okey Regal, MD  ranitidine (ZANTAC) 150 MG tablet TAKE 1 TO 2 TABLETS BY MOUTH TWICE DAILY 12/15/17   Pyrtle, Lajuan Lines, MD  sildenafil (VIAGRA) 50 MG tablet 1 tab po 1/2 hr prior to intercourse. Limit use to 1 tab/24 hrs 01/11/18   Ladell Pier, MD  traZODone (DESYREL) 100 MG tablet Take 50 mg by mouth at bedtime.  07/24/14   [provider]  valACYclovir (VALTREX) 500 MG tablet TAKE 1 TABLET BY MOUTH TWICE DAILY AS NEEDED FOR FLARE UPS 01/09/18   Comer, Okey Regal, MD    Family History Family History  Problem Relation Age of Onset  . Cancer Mother        laryngeal  . Pneumonia Father   . Diabetes Father   . Hypertension Sister   . Crohn's disease Brother   . Crohn's disease Maternal Grandmother   . Cancer Maternal Grandmother        patient unsure of type  . Colon cancer Maternal Grandfather     Social History Social History   Tobacco Use  . Smoking status: Current Every Day Smoker    Packs/day: 0.75    Types: Cigarettes  . Smokeless tobacco: Never Used  . Tobacco comment: not ready to quit  Substance Use Topics  . Alcohol use: Yes    Alcohol/week: 2.0 standard drinks    Types: 2 Standard drinks or equivalent per week    Comment: beer at times. not often  . Drug use: Yes    Frequency: 7.0 times per week    Types: Marijuana    Comment: occ     Allergies   Oxycodone   Review of Systems Review of Systems  HENT: Positive for congestion, postnasal drip, rhinorrhea and sore throat.   Respiratory: Positive for cough and shortness of breath.   Cardiovascular: Positive for chest pain.  Musculoskeletal: Positive for myalgias.  All other systems reviewed and  are negative.    Physical Exam Updated Vital Signs BP 122/73 (BP Location: Right Arm)   Pulse 73   Temp 99.2 F (37.3 C) (Oral)   Resp 16   SpO2 99%   Physical Exam  Constitutional: He is oriented to person, place, and time. He appears well-developed and well-nourished.  Looks uncomfortable, sounds congested but nontoxic.  Keeps his eyes closed during exam.  HENT:  Head: Normocephalic and atraumatic.  Nose: Mucosal edema and rhinorrhea present.  Mouth/Throat: Mucous membranes are normal. Posterior oropharyngeal erythema present. Tonsils are 2+ on the right. Tonsils are 2+ on the left. Tonsillar exudate.  Moderately enlarged tonsils, symmetrically with exudates bilaterally.  Uvula is midline.  No petechiae.  Oropharynx is erythematous.  Soft palate is flat.  Normal tongue protrusion without sublingual edema,  fullness tenderness.  No muffled voice.  Tolerating secretions. Moderate mucosal edema with erythema and clear rhinorrhea.  Septum is midline. No sinus tenderness.  Eyes: Pupils are equal, round, and reactive to light. Conjunctivae and EOM are normal.  Neck: Normal range of motion.  Tender, enlarged submandibular lymph nodes bilaterally right slightly bigger than the left.  No anterior neck edema.  Thyroid is nonpalpable.  Cardiovascular: Normal rate, regular rhythm and normal heart sounds.  Pulmonary/Chest: Effort normal and breath sounds normal.  Abdominal: Soft. Bowel sounds are normal. There is no tenderness.  No G/R/R. No suprapubic or CVA tenderness. Negative Murphy's and McBurney's  Musculoskeletal: Normal range of motion.  TL spine: No midline or paraspinal muscle tenderness.  Massage of paraspinal muscles of TL spine to make the spasms better per patient.  Lymphadenopathy:       Head (right side): Submandibular adenopathy present.       Head (left side): Submandibular adenopathy present.  Neurological: He is alert and oriented to person, place, and time.  Skin: Skin is  warm and dry. Capillary refill takes less than 2 seconds.  Warm, diaphoretic.  Psychiatric: He has a normal mood and affect. His behavior is normal. Judgment and thought content normal.  Nursing note and vitals reviewed.    ED Treatments / Results  Labs (all labs ordered are listed, but only abnormal results are displayed) Labs Reviewed  CBC WITH DIFFERENTIAL/PLATELET - Abnormal; Notable for the following components:      Result Value   WBC 10.9 (*)    Neutro Abs 8.4 (*)    All other components within normal limits  GROUP A STREP BY PCR  URINALYSIS, ROUTINE W REFLEX MICROSCOPIC  BASIC METABOLIC PANEL  INFLUENZA PANEL BY PCR (TYPE A & B)  I-STAT CG4 LACTIC ACID, ED  I-STAT TROPONIN, ED  I-STAT CG4 LACTIC ACID, ED  I-STAT TROPONIN, ED    EKG EKG Interpretation  Date/Time:  Friday April 14 2018 12:10:28 EDT Ventricular Rate:  69 PR Interval:  142 QRS Duration: 86 QT Interval:  364 QTC Calculation: 390 R Axis:   111 Text Interpretation:  Normal sinus rhythm Right axis deviation Abnormal ECG more consistent with previous ECG's than one done earlier in day Confirmed by Merrily Pew [phone]) on 04/14/2018 12:36:12 PM   Radiology Dg Chest 2 View  Result Date: 04/14/2018 CLINICAL DATA:  Cough and chest pain EXAM: CHEST - 2 VIEW COMPARISON:  10/22/2016 FINDINGS: The heart size and mediastinal contours are within normal limits. Both lungs are clear. The visualized skeletal structures are unremarkable. IMPRESSION: No active cardiopulmonary disease. Electronically Signed   By: Franchot Gallo M.D.   On: 04/14/2018 08:52    Procedures Procedures (including critical care time)  Medications Ordered in ED Medications  acetaminophen (TYLENOL) tablet 1,000 mg (1,000 mg Oral Given 04/14/18 0959)  ibuprofen (ADVIL,MOTRIN) tablet 600 mg (600 mg Oral Given 04/14/18 0959)  methocarbamol (ROBAXIN) tablet 1,000 mg (1,000 mg Oral Given 04/14/18 1056)     Initial Impression / Assessment  and Plan / ED Course  I have reviewed the triage vital signs and the nursing notes.  Pertinent labs & imaging results that were available during my care of the patient were reviewed by me and considered in my medical decision making (see chart for details).  Clinical Course as of Apr 14 1337  Fri Apr 14, 2018  1041 Patient called out for back pain.  I spoke to the patient again.  He states he has a  long history of back pain for over 20 years after a fall.  States he just needs some pain relief for the spasms.  He has receives 1G Tylenol and 600 ibuprofen so far, will give large dose of methocarbamol.  He is in agreement with this.  Pending strep test.   [CG]    Clinical Course User Index [CG] Kinnie Feil, PA-C    42 yo M with HIV here with URI type symptoms since yesterday. Also reports brief episodes of chest pressure that sound atypical. He has cardiac risk factors including tobacco use, HTN, HLD, HIV.  CP is non exertional without SOB.  His VL is undetectable and CD4 620.  Had cardiac work up June 2018 for atypica CP with normal stress test and echo that showed mild LVH and mild dilated RV.  Cardiac CP considered unlikely given symptomatology and recent reassuring cardiology w/u.  CP likely from persistent cough.  Will obtain screening labs, EKG, CXR, trop.   1200: Labs and CXR reasuring. Trop 0.00.  EKG with non specific TWI in III, avF and V1 that appear new from last EKG 1 year ago. Will repeat EKG. Obtain delta trop. Heart score =3.   1335: delta trop undetectable. Repeat EKG similar to most recent last year.  Discussed results with pt. He is considered appropriate for dc for symptomatic management of ILI symptoms.  He is to f/u with cardiology is CP persists.  Return precautions given. Flu swabs pending. Given HIV status will defer empiric influenza tx. Pt understands this and is comfortable with disposition.   Final Clinical Impressions(s) / ED Diagnoses   Final diagnoses:    Viral URI with cough  Atypical chest pain    ED Discharge Orders    None       Kinnie Feil, PA-C 04/14/18 1338    Mesner, Corene Cornea, MD 04/15/18 [id number]

## 2018-04-14 NOTE — Discharge Instructions (Addendum)
Your symptoms are most likely from a virus that has caused an upper respiratory infection. Your chest x-ray is negative. Your rapid strep test was negative. A viral illness typically peaks on day 2-3 and resolves after one week.    The main treatment approach for a viral upper respiratory infection is to treat the symptoms, support your immune system and prevent spread of illness.   Stay well-hydrated. Rest. You can use over the counter medications to help with symptoms: 600 mg ibuprofen (motrin, aleve, advil) or acetaminophen (tylenol) every 6 hours, around the clock to help with associated fevers, sore throat, headaches, generalized body aches and malaise.  Oxymetazoline (afrin) intranasal spray once daily for no more than 3 days to help with congestion, after 3 days you can switch to another over-the-counter nasal steroid spray such as fluticasone (flonase) Allergy medication (loratadine, cetirizine, etc) and phenylephrine (sudafed) help with nasal congestion, runny nose and postnasal drip.   Dextromethorphan (Delsym) to suppress cough without mucus Guaifenesin (mucinex) to help with built up mucus in chest and productive cough Wash your hands often to prevent spread.   A viral upper respiratory infection can also worsen and progress into pneumonia.  Monitor your symptoms. If your symptoms worsen, persist and you develop persistent fevers, chest pain, productive cough you should follow up with your primary care provider.

## 2018-04-17 ENCOUNTER — Encounter: Payer: Self-pay | Admitting: Psychology

## 2018-04-17 MED FILL — $Viagra 50mg tablet: 50 | 90 days supply | Qty: 30 | Fill #2

## 2018-04-18 ENCOUNTER — Ambulatory Visit: Payer: Self-pay

## 2018-04-19 ENCOUNTER — Ambulatory Visit (AMBULATORY_SURGERY_CENTER): Payer: Self-pay | Admitting: Internal Medicine

## 2018-04-19 ENCOUNTER — Encounter: Payer: Self-pay | Admitting: Internal Medicine

## 2018-04-19 VITALS — BP 115/60 | HR 64 | Temp 98.9°F | Resp 15 | Ht 68.0 in | Wt 206.0 lb

## 2018-04-19 DIAGNOSIS — D123 Benign neoplasm of transverse colon: Secondary | ICD-10-CM

## 2018-04-19 DIAGNOSIS — K635 Polyp of colon: Secondary | ICD-10-CM

## 2018-04-19 DIAGNOSIS — D125 Benign neoplasm of sigmoid colon: Secondary | ICD-10-CM

## 2018-04-19 DIAGNOSIS — K625 Hemorrhage of anus and rectum: Secondary | ICD-10-CM

## 2018-04-19 DIAGNOSIS — K648 Other hemorrhoids: Secondary | ICD-10-CM

## 2018-04-19 MED ORDER — SODIUM CHLORIDE 0.9 % IV SOLN: 500.0000 mL | Freq: Once | INTRAVENOUS | Status: DC

## 2018-04-19 NOTE — Progress Notes (Signed)
Called to room to assist during endoscopic procedure.  Patient ID and intended procedure confirmed with present staff. Received instructions for my participation in the procedure from the performing physician.  

## 2018-04-19 NOTE — Op Note (Signed)
[facility] Patient Name: Shawn Brooks Procedure Date: 04/19/2018 3:48 PM MRN: [id number] Endoscopist: Jerene Bears , MD Age: 42 Referring MD:  Date of Birth: 04-Feb-1976 Gender: Male Account #: [id number] Procedure:                Colonoscopy Indications:              Hematochezia, Rectal bleeding Medicines:                Monitored Anesthesia Care Procedure:                Pre-Anesthesia Assessment:                           - Prior to the procedure, a History and Physical                            was performed, and patient medications and                            allergies were reviewed. The patient's tolerance of                            previous anesthesia was also reviewed. The risks                            and benefits of the procedure and the sedation                            options and risks were discussed with the patient.                            All questions were answered, and informed consent                            was obtained. Prior Anticoagulants: The patient has                            taken no previous anticoagulant or antiplatelet                            agents. ASA Grade Assessment: II - A patient with                            mild systemic disease. After reviewing the risks                            and benefits, the patient was deemed in                            satisfactory condition to undergo the procedure.                           After obtaining informed consent, the colonoscope  was passed under direct vision. Throughout the                            procedure, the patient's blood pressure, pulse, and                            oxygen saturations were monitored continuously. The                            Colonoscope was introduced through the anus and                            advanced to the cecum, identified by appendiceal                            orifice and ileocecal valve. The  colonoscopy was                            performed without difficulty. The patient tolerated                            the procedure well. The quality of the bowel                            preparation was good. The ileocecal valve,                            appendiceal orifice, and rectum were photographed. Scope In: 3:56:24 PM Scope Out: 4:12:56 PM Scope Withdrawal Time: 0 hours 13 minutes 30 seconds  Total Procedure Duration: 0 hours 16 minutes 32 seconds  Findings:                 The digital rectal exam was normal.                           Two sessile polyps were found in the transverse                            colon. The polyps were 4 to 6 mm in size. These                            polyps were removed with a cold snare. Resection                            and retrieval were complete.                           A 3 mm polyp was found in the descending colon. The                            polyp was sessile. The polyp was removed with a  cold snare. Resection and retrieval were complete.                           Internal hemorrhoids were found during                            retroflexion. The hemorrhoids were small. There was                            a post-banding ulceration (with clean base) found                            in the distal rectum. Complications:            No immediate complications. Estimated Blood Loss:     Estimated blood loss was minimal. Impression:               - Two 4 to 6 mm polyps in the transverse colon,                            removed with a cold snare. Resected and retrieved.                           - One 3 mm polyp in the descending colon, removed                            with a cold snare. Resected and retrieved.                           - Small internal hemorrhoids. Recommendation:           - Patient has a contact number available for                            emergencies. The signs and symptoms of  potential                            delayed complications were discussed with the                            patient. Return to normal activities tomorrow.                            Written discharge instructions were provided to the                            patient.                           - Resume previous diet.                           - Continue present medications.                           - Await pathology results.                           -  Repeat colonoscopy is recommended. The                            colonoscopy date will be determined after pathology                            results from today's exam become available for                            review. Jerene Bears, MD 04/19/2018 4:24:02 PM This report has been signed electronically.

## 2018-04-19 NOTE — Progress Notes (Signed)
Report given to PACU, vss 

## 2018-04-19 NOTE — Patient Instructions (Signed)
YOU HAD AN ENDOSCOPIC PROCEDURE TODAY AT THE [facility]:   Refer to the procedure report that was given to you for any specific questions about what was found during the examination.  If the procedure report does not answer your questions, please call your gastroenterologist to clarify.  If you requested that your care partner not be given the details of your procedure findings, then the procedure report has been included in a sealed envelope for you to review at your convenience later.  YOU SHOULD EXPECT: Some feelings of bloating in the abdomen. Passage of more gas than usual.  Walking can help get rid of the air that was put into your GI tract during the procedure and reduce the bloating. If you had a lower endoscopy (such as a colonoscopy or flexible sigmoidoscopy) you may notice spotting of blood in your stool or on the toilet paper. If you underwent a bowel prep for your procedure, you may not have a normal bowel movement for a few days.  Please Note:  You might notice some irritation and congestion in your nose or some drainage.  This is from the oxygen used during your procedure.  There is no need for concern and it should clear up in a day or so.  SYMPTOMS TO REPORT IMMEDIATELY:   Following lower endoscopy (colonoscopy or flexible sigmoidoscopy):  Excessive amounts of blood in the stool  Significant tenderness or worsening of abdominal pains  Swelling of the abdomen that is new, acute  Fever of 100F or higher  For urgent or emergent issues, a gastroenterologist can be reached at any hour by calling [phone].   DIET:  We do recommend a small meal at first, but then you may proceed to your regular diet.  Drink plenty of fluids but you should avoid alcoholic beverages for 24 hours.  ACTIVITY:  You should plan to take it easy for the rest of today and you should NOT DRIVE or use heavy machinery until tomorrow (because of the sedation medicines used during the test).     FOLLOW UP: Our staff will call the number listed on your records the next business day following your procedure to check on you and address any questions or concerns that you may have regarding the information given to you following your procedure. If we do not reach you, we will leave a message.  However, if you are feeling well and you are not experiencing any problems, there is no need to return our call.  We will assume that you have returned to your regular daily activities without incident.  If any biopsies were taken you will be contacted by phone or by letter within the next 1-3 weeks.  Please call us at [phone] if you have not heard about the biopsies in 3 weeks.    SIGNATURES/CONFIDENTIALITY: You and/or your care partner have signed paperwork which will be entered into your electronic medical record.  These signatures attest to the fact that that the information above on your After Visit Summary has been reviewed and is understood.  Full responsibility of the confidentiality of this discharge information lies with you and/or your care-partner. 

## 2018-04-20 ENCOUNTER — Other Ambulatory Visit: Payer: Self-pay | Admitting: *Deleted

## 2018-04-20 ENCOUNTER — Telehealth: Payer: Self-pay | Admitting: *Deleted

## 2018-04-20 DIAGNOSIS — K2990 Gastroduodenitis, unspecified, without bleeding: Principal | ICD-10-CM

## 2018-04-20 DIAGNOSIS — K2970 Gastritis, unspecified, without bleeding: Secondary | ICD-10-CM

## 2018-04-20 MED ORDER — FAMOTIDINE 20 MG PO TABS: 20.0000 mg | ORAL_TABLET | Freq: Two times a day (BID) | ORAL | 11 refills | Status: DC

## 2018-04-20 NOTE — Telephone Encounter (Signed)
  Follow up Call-  Call back number 04/19/2018 10/20/2016  Post procedure Call Back phone  # [phone]  Permission to leave phone message Yes Yes  Some recent data might be hidden     Patient questions:  Do you have a fever, pain , or abdominal swelling? No. Pain Score  0 *  Have you tolerated food without any problems? Yes.    Have you been able to return to your normal activities? Yes.    Do you have any questions about your discharge instructions: Diet   No. Medications  Yes.   Follow up visit  No.  Do you have questions or concerns about your Care? No.  Actions: * If pain score is 4 or above: No action needed, pain <4.  Pt. Wanted to know if he can get a rx for  Ranitidine 150 mg bid,informed him that it  Could be  Purchased otc but he said his insurance would pay for it with rx.

## 2018-04-20 NOTE — Telephone Encounter (Signed)
Call placed to pt. To inform him that Dr. Hilarie Fredrickson was changing rx. To famotidine 20mg  bid twice a day and that he can pick up from his  Rx. At pharmacy,pt. Verbalize "ok".

## 2018-04-20 NOTE — Telephone Encounter (Signed)
Ok, but change to famotidine 20 mg twice daily Substituting as ranitidine is being checked to ensure longterm safety. Famotidine should work just as well Ok to send as Rx

## 2018-04-26 ENCOUNTER — Encounter: Payer: Self-pay | Admitting: Internal Medicine

## 2018-04-27 ENCOUNTER — Ambulatory Visit (INDEPENDENT_AMBULATORY_CARE_PROVIDER_SITE_OTHER): Payer: Self-pay | Admitting: Neurology

## 2018-04-27 DIAGNOSIS — R202 Paresthesia of skin: Secondary | ICD-10-CM

## 2018-04-27 DIAGNOSIS — G5603 Carpal tunnel syndrome, bilateral upper limbs: Secondary | ICD-10-CM

## 2018-04-27 NOTE — Procedures (Signed)
[facility]  [place], [place]  [place], [place] [address] Tel: [phone] Fax:  [phone] Test Date:  04/27/2018  Patient: Shawn Brooks DOB: 1976/05/21 Physician: Narda Amber, DO  Sex: Male Height: 5\' 8"  Ref Phys: Karle Plumber, MD  ID#: [id number] Temp: 35.0C Technician:    Patient Complaints: This is a 42 year old man referred for evaluation of bilateral arm pain and paresthesias.  NCV & EMG Findings: Extensive electrodiagnostic testing of the right upper extremity and additional studies of the left shows:  1. Bilateral median, ulnar, and mixed palmar sensory responses are within normal limits. 2. Bilateral median and ulnar motor responses are within normal limits. 3. There is no evidence of active or chronic motor axonal loss changes affecting any of the tested muscles.  Motor unit configuration and recruitment pattern is within normal limits.  Impression: This is a normal study of the upper extremities.  In particular, there is no evidence of carpal tunnel syndrome or cervical radiculopathy   ___________________________ Narda Amber, DO    Nerve Conduction Studies Anti Sensory Summary Table   Site NR Peak (ms) Norm Peak (ms) P-T Amp (V) Norm P-T Amp  Left Median Anti Sensory (2nd Digit)  35C  Wrist    3.3 <3.4 34.2 >20  Right Median Anti Sensory (2nd Digit)  35C  Wrist    3.1 <3.4 32.3 >20  Left Ulnar Anti Sensory (5th Digit)  35C  Wrist    2.8 <3.1 23.7 >12  Right Ulnar Anti Sensory (5th Digit)  35C  Wrist    2.6 <3.1 26.2 >12   Motor Summary Table   Site NR Onset (ms) Norm Onset (ms) O-P Amp (mV) Norm O-P Amp Site1 Site2 Delta-0 (ms) Dist (cm) Vel (m/s) Norm Vel (m/s)  Left Median Motor (Abd Poll Brev)  Wrist    2.7 <3.9 9.1 >6 Elbow Wrist 5.7 32.0 56 >50  Elbow    8.4  8.8         Right Median Motor (Abd Poll Brev)  35C  Wrist    3.0 <3.9 9.3 >6 Elbow Wrist 5.5 32.0 58 >50  Elbow    8.5  9.2         Left Ulnar Motor (Abd  Dig Minimi)  Wrist    2.2 <3.1 12.2 >7 B Elbow Wrist 4.4 27.0 61 >50  B Elbow    6.6  11.4  A Elbow B Elbow 1.8 10.0 56 >50  A Elbow    8.4  10.4         Right Ulnar Motor (Abd Dig Minimi)  35C  Wrist    2.6 <3.1 9.3 >7 B Elbow Wrist 4.0 26.0 65 >50  B Elbow    6.6  9.0  A Elbow B Elbow 1.6 10.0 63 >50  A Elbow    8.2  9.0          Comparison Summary Table   Site NR Peak (ms) Norm Peak (ms) P-T Amp (V) Site1 Site2 Delta-P (ms) Norm Delta (ms)  Left Median/Ulnar Palm Comparison (Wrist - 8cm)  Median Palm    1.8 <2.2 25.4 Median Palm Ulnar Palm 0.3   Ulnar Palm    1.5 <2.2 12.9      Right Median/Ulnar Palm Comparison (Wrist - 8cm)  35C  Median Palm    1.9 <2.2 38.0 Median Palm Ulnar Palm 0.1   Ulnar Palm    1.8 <2.2 19.7       EMG  Side Muscle Ins Act Fibs Psw Fasc Number Recrt Dur Dur. Amp Amp. Poly Poly. Comment  Right 1stDorInt Nml Nml Nml Nml Nml Nml Nml Nml Nml Nml Nml Nml N/A  Right PronatorTeres Nml Nml Nml Nml Nml Nml Nml Nml Nml Nml Nml Nml N/A  Right Biceps Nml Nml Nml Nml Nml Nml Nml Nml Nml Nml Nml Nml N/A  Right Triceps Nml Nml Nml Nml Nml Nml Nml Nml Nml Nml Nml Nml N/A  Right Deltoid Nml Nml Nml Nml Nml Nml Nml Nml Nml Nml Nml Nml N/A  Left 1stDorInt Nml Nml Nml Nml Nml Nml Nml Nml Nml Nml Nml Nml N/A  Left PronatorTeres Nml Nml Nml Nml Nml Nml Nml Nml Nml Nml Nml Nml N/A  Left Biceps Nml Nml Nml Nml Nml Nml Nml Nml Nml Nml Nml Nml N/A  Left Triceps Nml Nml Nml Nml Nml Nml Nml Nml Nml Nml Nml Nml N/A  Left Deltoid Nml Nml Nml Nml Nml Nml Nml Nml Nml Nml Nml Nml N/A      Waveforms:

## 2018-05-01 ENCOUNTER — Ambulatory Visit: Payer: Self-pay | Attending: Internal Medicine | Admitting: Internal Medicine

## 2018-05-01 ENCOUNTER — Encounter: Payer: Self-pay | Admitting: Internal Medicine

## 2018-05-01 VITALS — BP 119/77 | HR 78 | Temp 98.1°F | Resp 16 | Wt 206.6 lb

## 2018-05-01 DIAGNOSIS — M545 Low back pain, unspecified: Secondary | ICD-10-CM

## 2018-05-01 DIAGNOSIS — N183 Chronic kidney disease, stage 3 (moderate): Secondary | ICD-10-CM | POA: Insufficient documentation

## 2018-05-01 DIAGNOSIS — F419 Anxiety disorder, unspecified: Secondary | ICD-10-CM | POA: Insufficient documentation

## 2018-05-01 DIAGNOSIS — Z885 Allergy status to narcotic agent status: Secondary | ICD-10-CM | POA: Insufficient documentation

## 2018-05-01 DIAGNOSIS — F17210 Nicotine dependence, cigarettes, uncomplicated: Secondary | ICD-10-CM | POA: Insufficient documentation

## 2018-05-01 DIAGNOSIS — K602 Anal fissure, unspecified: Secondary | ICD-10-CM | POA: Insufficient documentation

## 2018-05-01 DIAGNOSIS — I129 Hypertensive chronic kidney disease with stage 1 through stage 4 chronic kidney disease, or unspecified chronic kidney disease: Secondary | ICD-10-CM | POA: Insufficient documentation

## 2018-05-01 DIAGNOSIS — G8929 Other chronic pain: Secondary | ICD-10-CM | POA: Insufficient documentation

## 2018-05-01 DIAGNOSIS — R569 Unspecified convulsions: Secondary | ICD-10-CM | POA: Insufficient documentation

## 2018-05-01 DIAGNOSIS — Z8781 Personal history of (healed) traumatic fracture: Secondary | ICD-10-CM

## 2018-05-01 DIAGNOSIS — B20 Human immunodeficiency virus [HIV] disease: Secondary | ICD-10-CM | POA: Insufficient documentation

## 2018-05-01 DIAGNOSIS — E559 Vitamin D deficiency, unspecified: Secondary | ICD-10-CM | POA: Insufficient documentation

## 2018-05-01 DIAGNOSIS — R7303 Prediabetes: Secondary | ICD-10-CM | POA: Insufficient documentation

## 2018-05-01 DIAGNOSIS — K5900 Constipation, unspecified: Secondary | ICD-10-CM | POA: Insufficient documentation

## 2018-05-01 DIAGNOSIS — M62830 Muscle spasm of back: Secondary | ICD-10-CM | POA: Insufficient documentation

## 2018-05-01 DIAGNOSIS — Z5181 Encounter for therapeutic drug level monitoring: Secondary | ICD-10-CM | POA: Insufficient documentation

## 2018-05-01 DIAGNOSIS — R200 Anesthesia of skin: Secondary | ICD-10-CM

## 2018-05-01 DIAGNOSIS — M5416 Radiculopathy, lumbar region: Secondary | ICD-10-CM | POA: Insufficient documentation

## 2018-05-01 DIAGNOSIS — K648 Other hemorrhoids: Secondary | ICD-10-CM | POA: Insufficient documentation

## 2018-05-01 DIAGNOSIS — N529 Male erectile dysfunction, unspecified: Secondary | ICD-10-CM | POA: Insufficient documentation

## 2018-05-01 DIAGNOSIS — Z79899 Other long term (current) drug therapy: Secondary | ICD-10-CM | POA: Insufficient documentation

## 2018-05-01 DIAGNOSIS — E785 Hyperlipidemia, unspecified: Secondary | ICD-10-CM | POA: Insufficient documentation

## 2018-05-01 MED ORDER — CYCLOBENZAPRINE HCL 10 MG PO TABS: 10.0000 mg | ORAL_TABLET | Freq: Two times a day (BID) | ORAL | 2 refills | Status: DC | PRN

## 2018-05-01 NOTE — Progress Notes (Signed)
Pt states he is also having pain in b/l feet

## 2018-05-01 NOTE — Progress Notes (Signed)
Patient ID: Shawn Brooks, male    DOB: [birth year]  MRN: [id number]  CC: Medication Management   Subjective: Shawn Brooks is a 42 y.o. male who presents for UC visit His concerns today include:  HIV, DJD, HTN, HL, anxiety, tob, CKD stage 2-3, vit D def   Patient complains of shooting pain in rectum over the past several days.  He notices it mainly with standing.  No pain with bowel movements.  Still having blood in stools intermittently.  Had seen gastroenterologist Dr. Hilarie Fredrickson a few weeks ago.  He has known internal hemorrhoids 2 of which have been banded.  He was also found to have fissure.  Shawn Brooks was prescribed but he is unable to afford it. Taking a [employer] brand stool softner which has helped keep the bowel movements soft.   Patient is requesting a referral to neurosurgeon Dr. Glenna Fellows with [facility] in [place]. C/o back spasms in the lower back that are worse with sitting.  He gets jabbing pain that shoots up to the back with every step that he makes.  Endorses numbness in lower back with tingling into legs.  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  He is wondering if there is something stronger than Flexeril.  He takes the Flexeril 5 mg and is able to tolerate it without drowsiness.  He is not wanting to be placed on any heavy narcotics like hydrocodone or oxycodone.  - Issues with his back started 20 years ago when he had a fall.  He sustained another fall in 2017 where he fractured 102 vertebrae in the lumbar spine.  He was unable to afford to see a specialist but was seen in the emergency room and given a back brace which she wore for a while.  On last visit he c/o shooting pains in both hands and increased numbness in the right hand for over 9 months.  Seen in the ER in January of this year for same and was given wrist splints for possible carpal tunnel syndrome.  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  Has to use the wrist splint on the right hand at least about 4 times a week and on the left hand about  2-3x/wk.  Right is worse than the left.  He feels that it is getting worse. I referred him for nerve conduction study.  He had this done recently and it was normal.  Patient states that he is still having pain and numbness in his hands.  Patient Active Problem List   Diagnosis Date Noted  . Finger laceration 01/09/2018  . Memory problem 09/01/2017  . Vitamin D deficiency 09/01/2017  . Primary osteoarthritis of left knee 05/02/2017  . Needs flu shot 05/02/2017  . Stage 3 chronic kidney disease ([place]) 05/02/2017  . Prediabetes 12/22/2016  . Diabetes mellitus screening 11/17/2016  . Tobacco abuse 05/31/2016  . Lumbar transverse process fracture ([place]) 10/02/2015  . Screening examination for sexually transmitted disease 04/04/2014  . Seizures ([place]) 08/30/2013  . Personal history of digestive disease 01/15/2013  . Thoracic or lumbosacral neuritis or radiculitis 01/15/2013  . H/O gastrointestinal disease 01/15/2013  . Lumbar radiculopathy 01/15/2013  . 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 Condyloma acuminatum 11/17/2012  . 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 AGW (anogenital  warts) 11/17/2012  . Hemorrhoid 09/27/2012  . BRBPR (bright red blood per rectum) 07/27/2012  . Prostatitis 04/12/2012  . Constipation 03/09/2012  . Callus 08/16/2011  . Ingrown toenail 08/16/2011  . Chronic low back pain 08/16/2011  . Dyslipidemia 09/03/2010  . Herpes simplex virus (HSV) infection 07/06/2010  . Erectile dysfunction 07/06/2010  . ANXIETY DEPRESSION 05/06/2010  . BURSITIS, KNEE 05/06/2010  . Human immunodeficiency virus (HIV) disease ([place]) 04/16/2010     Current Outpatient Medications on File Prior to Visit  Medication Sig Dispense Refill  . diclofenac sodium (VOLTAREN) 1 % GEL Apply 2 g topically 2 (two) times  daily as needed. (Patient not taking: Reported on 04/19/2018) 100 g 1  . diltiazem 2 % GEL Insert intra-anally twice daily to the 1st finger joint x 6-8 weeks. 30 g 0  . famotidine (PEPCID) 20 MG tablet Take 1 tablet (20 mg total) by mouth 2 (two) times daily. 60 tablet 11  . gabapentin (NEURONTIN) 300 MG capsule Take 1 capsule (300 mg total) by mouth 3 (three) times daily. 90 capsule 2  . GENVOYA 150-150-200-10 MG TABS tablet TAKE 1 TABLET BY MOUTH EVERY DAY WITH PREZISTA 30 tablet 5  . pravastatin (PRAVACHOL) 40 MG tablet TAKE 1 TABLET BY MOUTH EVERY DAY 30 tablet 6  . PREZISTA 800 MG tablet TAKE 1 TABLET BY MOUTH EVERY DAY 30 tablet 5  . ranitidine (ZANTAC) 150 MG tablet TAKE 1 TO 2 TABLETS BY MOUTH TWICE DAILY 120 tablet 3  . sildenafil (VIAGRA) 50 MG tablet 1 tab po 1/2 hr prior to intercourse. Limit use to 1 tab/24 hrs 30 tablet 3  . traZODone (DESYREL) 100 MG tablet Take 50 mg by mouth at bedtime.   2  . valACYclovir (VALTREX) 500 MG tablet TAKE 1 TABLET BY MOUTH TWICE DAILY AS NEEDED FOR FLARE UPS 60 tablet 5   No current facility-administered medications on file prior to visit.     Allergies  Allergen Reactions  . Oxycodone Itching    Social History   Socioeconomic History  . Marital status: Divorced    Spouse name: Not on file  . Number of children: 1  . Years of education: Not on file  . Highest education level: Not on file  Occupational History  . Occupation: Disabled  Social Needs  . Financial resource strain: Not on file  . Food insecurity:    Worry: Not on file    Inability: Not on file  . Transportation needs:    Medical: Not on file    Non-medical: Not on file  Tobacco Use  . Smoking status: Current Every Day Smoker    Packs/day: 0.50    Types: Cigarettes  . Smokeless tobacco: Never Used  . Tobacco comment: not ready to quit  Substance and Sexual Activity  . Alcohol use: Yes    Alcohol/week: 2.0 standard drinks    Types: 2 Standard drinks or equivalent  per week    Comment: beer at times. not often  . Drug use: Yes    Frequency: 7.0 times per week    Types: Marijuana    Comment: occ  . Sexual activity: Not Currently    Partners: Male    Comment: accepted condoms  Lifestyle  . Physical activity:    Days per week: Not on file    Minutes per session: Not on file  . Stress: Not on file  Relationships  . Social connections:    Talks on phone: Not on file  Gets together: Not on file    Attends religious service: Not on file    Active member of club or organization: Not on file    Attends meetings of clubs or organizations: Not on file    Relationship status: Not on file  . Intimate partner violence:    Fear of current or ex partner: Not on file    Emotionally abused: Not on file    Physically abused: Not on file    Forced sexual activity: Not on file  Other Topics Concern  . Not on file  Social History Narrative  . Not on file    Family History  Problem Relation Age of Onset  . Cancer Mother        laryngeal  . Pneumonia Father   . Diabetes Father   . Hypertension Sister   . Crohn's disease Brother   . Crohn's disease Maternal Grandmother   . Cancer Maternal Grandmother        patient unsure of type  . Colon cancer Maternal Grandfather     Past Surgical History:  Procedure Laterality Date  . ESOPHAGOGASTRODUODENOSCOPY  03/27/2012   Procedure: ESOPHAGOGASTRODUODENOSCOPY (EGD);  Surgeon: Lear Ng, MD;  Location: [facility];  Service: Endoscopy;  Laterality: N/A;  . IRRIGATION AND DEBRIDEMENT ABSCESS N/A 03/13/2015   Procedure: IRRIGATION AND DEBRIDEMENT ABSCESS;  Surgeon: Johnathan Hausen, MD;  Location: [facility];  Service: General;  Laterality: N/A;  . WISDOM TOOTH EXTRACTION      ROS: Review of Systems Negative except as stated above PHYSICAL EXAM: BP 119/77   Pulse 78   Temp 98.1 F (36.7 C) (Oral)   Resp 16   Wt 206 lb 9.6 oz (93.7 kg)   SpO2 98%   BMI 31.41 kg/m   Physical Exam  General  appearance -patient sitting in chair, he appears a little bit uncomfortable.   Mental status - normal mood, behavior, speech, dress, motor activity, and thought processes Rectal - CMA Pollock present: Small fissure noted at the 5 o'clock position.  No external hemorrhoids Musculoskeletal -paraspinal muscles in the lower thoracic and lumbar region very tight and tender to palpation.  Mild tenderness on palpation of the lumbar spine.  On straight leg raise he is uncomfortable with elevation of the legs to about 30 degrees   ASSESSMENT AND PLAN:  1. Spasm of muscle of lower back Patient agreeable to trying higher dose of Flexeril as needed. - cyclobenzaprine (FLEXERIL) 10 MG tablet; Take 1 tablet (10 mg total) by mouth 2 (two) times daily as needed for muscle spasms.  Dispense: 40 tablet; Refill: 2  2. Chronic bilateral low back pain without sciatica Referral submitted to neurosurgery per his request.  He will continue gabapentin and Flexeril - Ambulatory referral to Neurosurgery  3. History of fracture of vertebra - Ambulatory referral to Neurosurgery  4. Rectal fissure If patient is unable to purchase the Rectiv, I recommend doing sitz bath's twice a day for about 10 minutes.  He could place some Epson salt in the water.  Good  5. Numbness in both hands Will refer to neurology for their opinion on what may be causing the symptoms that he is having given that the nerve conduction study was normal. - Ambulatory referral to Neurology   Patient was given the opportunity to ask questions.  Patient verbalized understanding of the plan and was able to repeat key elements of the plan.   Orders Placed This Encounter  Procedures  . Ambulatory referral to  Neurosurgery  . Ambulatory referral to Neurology     Requested Prescriptions   Signed Prescriptions Disp Refills  . cyclobenzaprine (FLEXERIL) 10 MG tablet 40 tablet 2    Sig: Take 1 tablet (10 mg total) by mouth 2 (two) times daily as  needed for muscle spasms.    No follow-ups on file.  Karle Plumber, MD, FACP

## 2018-05-01 NOTE — Patient Instructions (Signed)
Try to purchase the Rectiv cream that was prescribed by Dr. Hilarie Fredrickson for the recal fissure.  Do sitz baths by sitting in warm water with Epsom salt in it 1-2 times daily. Try to keep your bowel movements soft and regular.

## 2018-05-02 ENCOUNTER — Encounter: Payer: Self-pay | Admitting: Neurology

## 2018-05-02 ENCOUNTER — Telehealth: Payer: Self-pay | Admitting: Internal Medicine

## 2018-05-02 NOTE — Telephone Encounter (Signed)
-----   Message from Ena Dawley sent at 05/02/2018 11:24 AM EST ----- Gm  Dr  Wynetta Emery  Pt do not have insurance I send a letter to patient to apply for the cone discount CAFA with application  to be refer to a Podiatry specialist.  I called patient and I lvm with the info and to contact me  .  ----- Message ----- From: Ladell Pier, MD Sent: 05/01/2018   5:55 PM EST To: Ena Dawley  Patient was inquiring about the podiatry referral.  He has not received a call as yet.

## 2018-05-04 ENCOUNTER — Telehealth: Payer: Self-pay | Admitting: Internal Medicine

## 2018-05-04 NOTE — Telephone Encounter (Signed)
Patient states that he did not call. States his pharmacy may have called. He indicates that he has not taken the famotidine that we sent in 04/20/18 because there was a "mix up" at the pharmacy. States they will get it in tomorrow. I advised we would recommend he take famotidine in place of ranitidine for now in lite of the fact that there are studies currently being done about the possibility of carcinogens being present in ranitidine. Although more studies need to be completed and the amounts of these carcinogens may be very minute, we would rather err on the side of caution and switch medications. He verbalizes understanding and will call us back if the famotidine is not effective.

## 2018-05-08 ENCOUNTER — Telehealth: Payer: Self-pay | Admitting: Internal Medicine

## 2018-05-08 MED ORDER — RANITIDINE HCL 300 MG PO TABS: 300.0000 mg | ORAL_TABLET | Freq: Two times a day (BID) | ORAL | 11 refills | Status: DC

## 2018-05-08 NOTE — Telephone Encounter (Signed)
Can go back to ranitidine 300 mg every 12 hours for GERD

## 2018-05-08 NOTE — Telephone Encounter (Signed)
Updated rx sent to pharmacy

## 2018-05-08 NOTE — Telephone Encounter (Signed)
Dr Hilarie Fredrickson, see 05/04/18 note... I am getting conflicting stories from patient and pharmacy. May I refill zantac if patient prefers?

## 2018-05-15 ENCOUNTER — Other Ambulatory Visit: Payer: Self-pay

## 2018-05-15 DIAGNOSIS — Z113 Encounter for screening for infections with a predominantly sexual mode of transmission: Secondary | ICD-10-CM

## 2018-05-15 DIAGNOSIS — B20 Human immunodeficiency virus [HIV] disease: Secondary | ICD-10-CM

## 2018-05-16 LAB — URINE CYTOLOGY ANCILLARY ONLY
Chlamydia: NEGATIVE
NEISSERIA GONORRHEA: NEGATIVE

## 2018-05-16 LAB — T-HELPER CELL (CD4) - (RCID CLINIC ONLY)
CD4 T CELL ABS: 780 /uL (ref 400–2700)
CD4 T CELL HELPER: 27 % — AB (ref 33–55)

## 2018-05-17 LAB — COMPLETE METABOLIC PANEL WITH GFR
AG RATIO: 1.6 (calc) (ref 1.0–2.5)
ALBUMIN MSPROF: 4.2 g/dL (ref 3.6–5.1)
ALKALINE PHOSPHATASE (APISO): 72 U/L (ref 40–115)
ALT: 15 U/L (ref 9–46)
AST: 20 U/L (ref 10–40)
BILIRUBIN TOTAL: 0.3 mg/dL (ref 0.2–1.2)
BUN: 13 mg/dL (ref 7–25)
CHLORIDE: 105 mmol/L (ref 98–110)
CO2: 26 mmol/L (ref 20–32)
Calcium: 9.4 mg/dL (ref 8.6–10.3)
Creat: 1.16 mg/dL (ref 0.60–1.35)
GFR, Est African American: 88 mL/min/{1.73_m2} (ref >=60)
GFR, Est Non African American: 76 mL/min/{1.73_m2} (ref >=60)
GLOBULIN: 2.7 g/dL (ref 1.9–3.7)
Glucose, Bld: 84 mg/dL (ref 65–99)
POTASSIUM: 4.3 mmol/L (ref 3.5–5.3)
SODIUM: 140 mmol/L (ref 135–146)
Total Protein: 6.9 g/dL (ref 6.1–8.1)

## 2018-05-17 LAB — HIV-1 RNA QUANT-NO REFLEX-BLD
HIV 1 RNA Quant: <20 copies/mL — AB
HIV-1 RNA QUANT, LOG: DETECTED {Log_copies}/mL — AB

## 2018-05-17 LAB — RPR: RPR Ser Ql: NONREACTIVE

## 2018-05-29 ENCOUNTER — Encounter: Payer: Self-pay | Admitting: Internal Medicine

## 2018-05-31 IMAGING — CR DG LUMBAR SPINE COMPLETE 4+V
5 series · 5 of 5 positions shown · non-contrast
Comparison: September 29, 2015

CLINICAL DATA: Acute onset left-sided pain

EXAM:
LUMBAR SPINE - COMPLETE 4+ VIEW

[t lumbar spine ap]
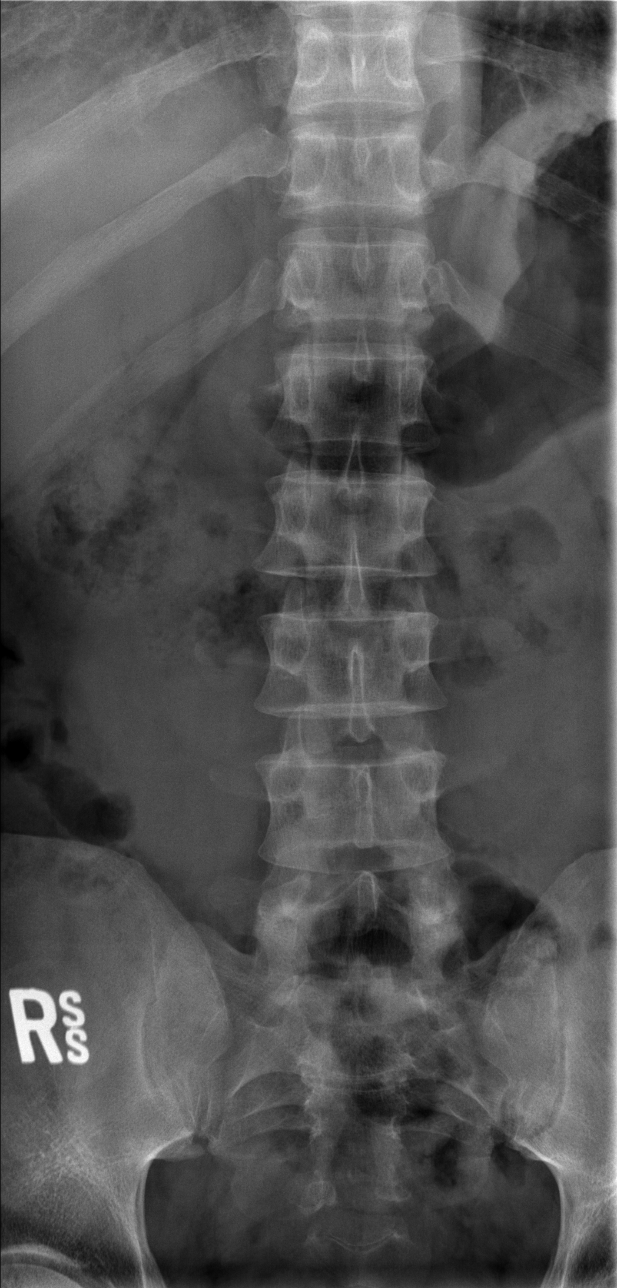

[t lumbar spine obl (1 of 2)]
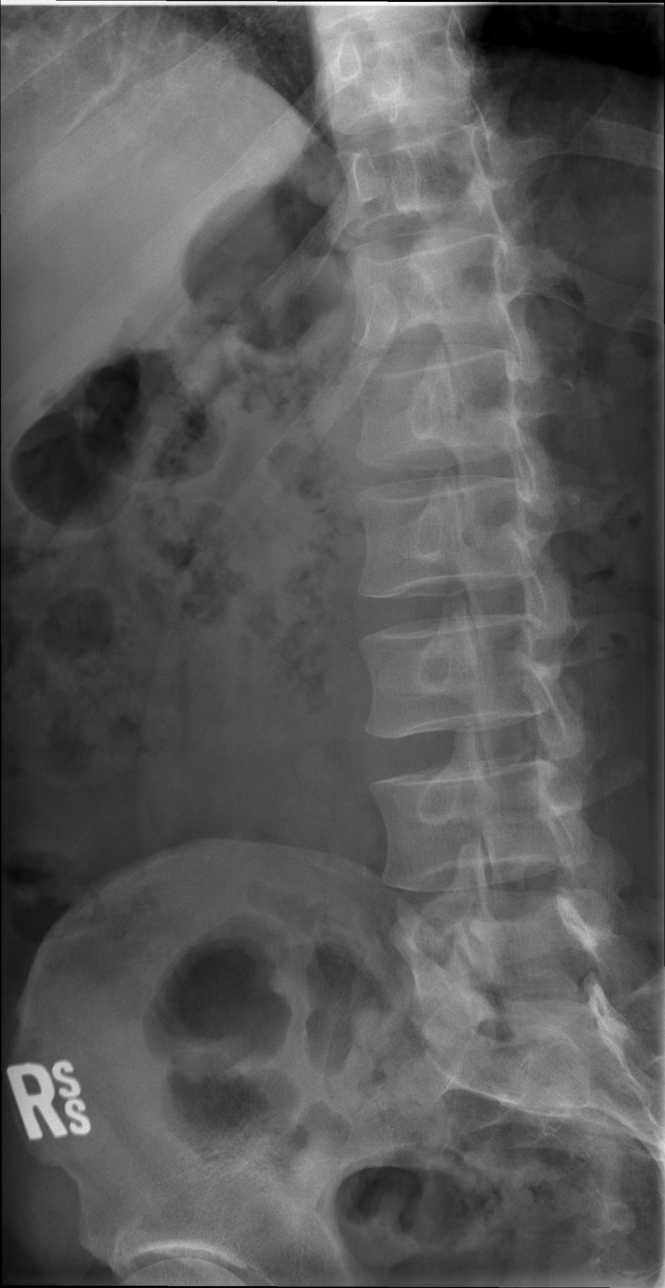

[t lumbar spine obl (2 of 2)]
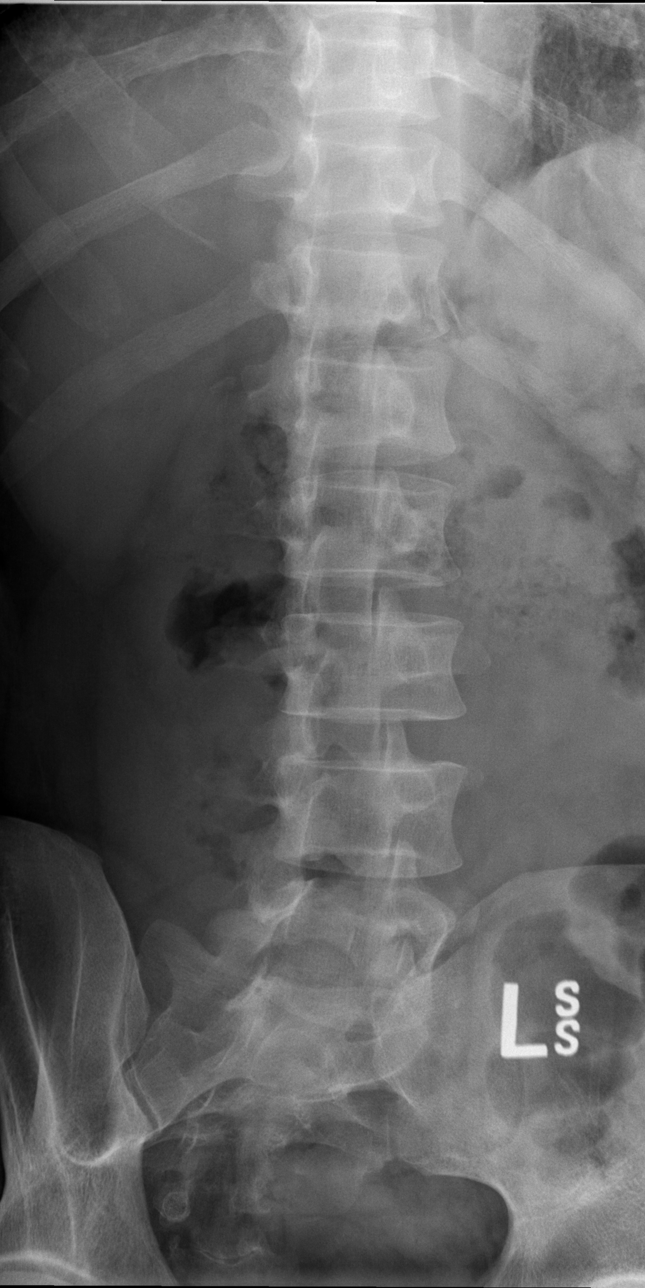

[t lumbar spine lat]
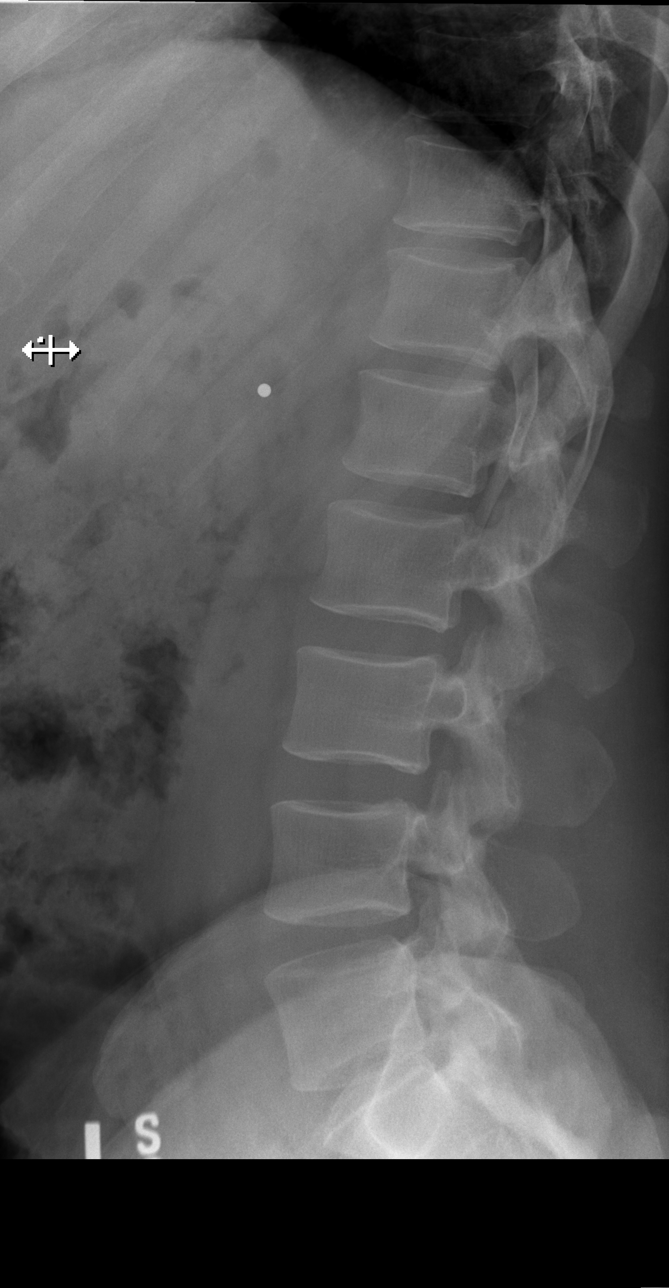

[t lumbar l-5 s-1 spot]
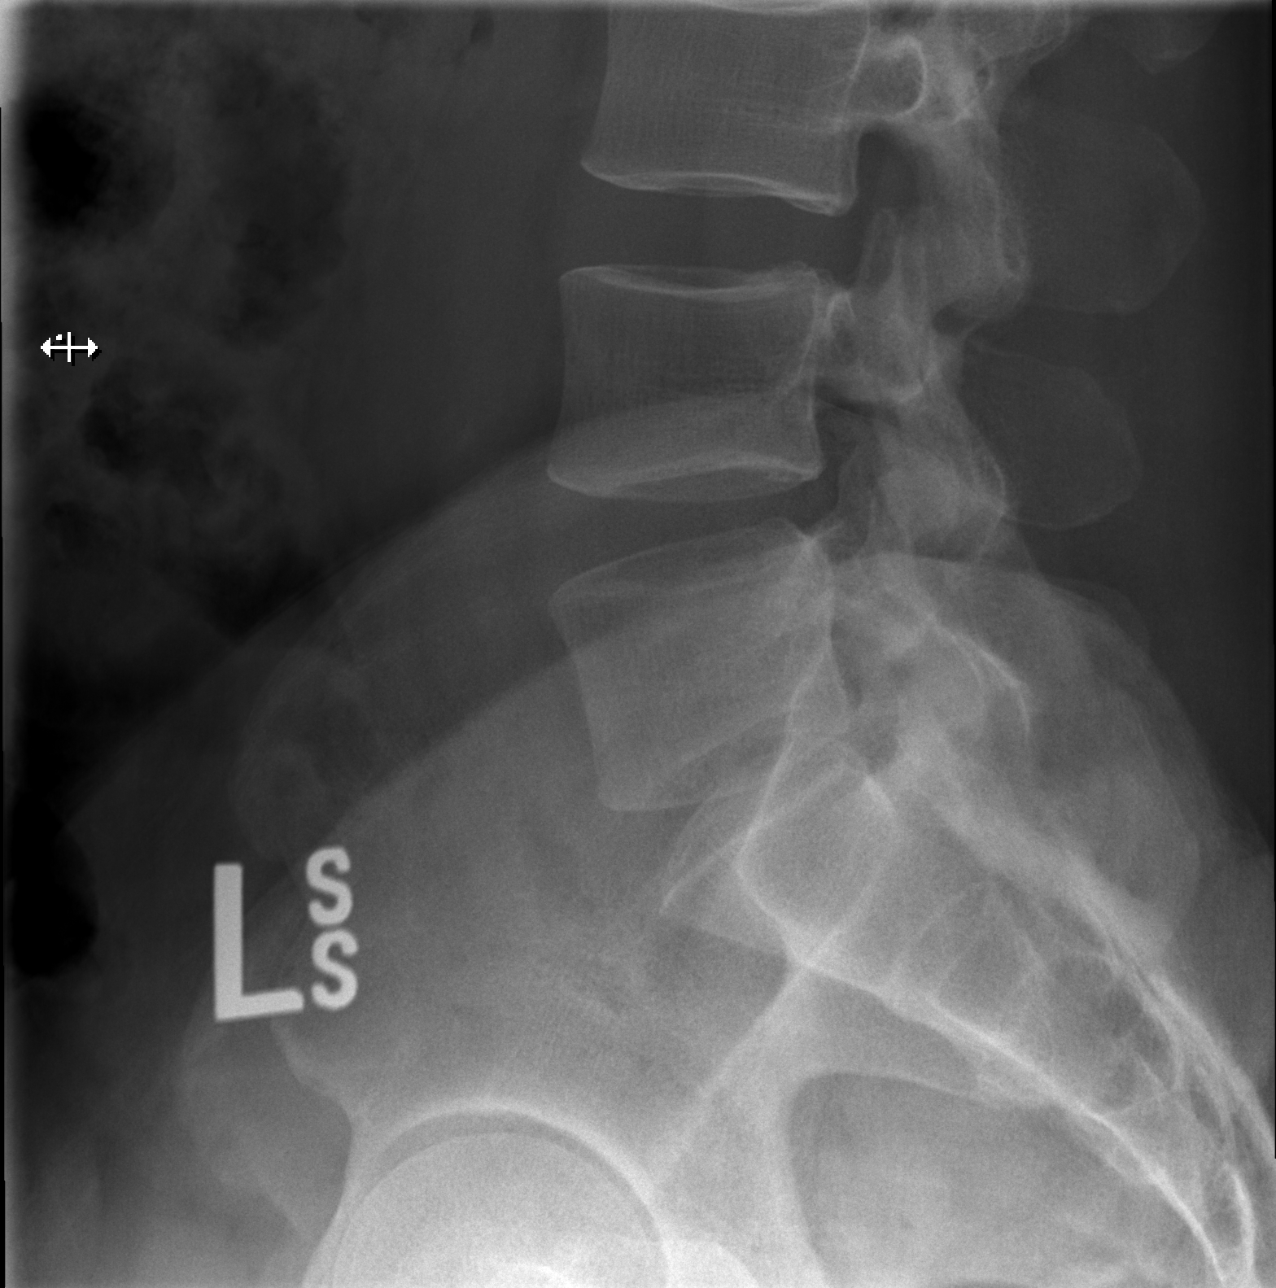

[5 of 5 positions shown; findings below may reference images not displayed]

FINDINGS: Frontal, lateral, spot lumbosacral lateral, and bilateral oblique
views were obtained. There are 5 non-rib-bearing lumbar type
vertebral bodies. The patient has had prior fractures of the left L1
and L2 transverse processes with healing in these areas. No acute
fracture is evident. There is no spondylolisthesis. Disc spaces are
unremarkable. There is no appreciable facet arthropathy.
IMPRESSION: There has been interval healing of prior fractures of the L1 and L2
left transverse processes. No acute fracture evident. No
spondylolisthesis. No apparent arthropathy.

## 2018-06-08 ENCOUNTER — Other Ambulatory Visit: Payer: Self-pay | Admitting: Internal Medicine

## 2018-06-08 DIAGNOSIS — B20 Human immunodeficiency virus [HIV] disease: Secondary | ICD-10-CM

## 2018-07-04 MED FILL — $Viagra 50mg tablet: 50 | 90 days supply | Qty: 30 | Fill #3

## 2018-07-11 ENCOUNTER — Other Ambulatory Visit: Payer: Self-pay | Admitting: Internal Medicine

## 2018-07-11 DIAGNOSIS — B20 Human immunodeficiency virus [HIV] disease: Secondary | ICD-10-CM

## 2018-07-13 ENCOUNTER — Other Ambulatory Visit: Payer: Self-pay | Admitting: Internal Medicine

## 2018-07-13 DIAGNOSIS — B20 Human immunodeficiency virus [HIV] disease: Secondary | ICD-10-CM

## 2018-07-14 NOTE — Progress Notes (Deleted)
[facility] Clinic Note - Initial Visit   Date: 07/14/18  Shawn Brooks MRN: [id number] DOB: 19-Apr-1976   Dear Dr. Wynetta Emery:  Thank you for your kind referral of [place] for consultation of bilateral hand numbness. Although his history is well known to you, please allow [place] to reiterate it for the purpose of our medical record. The patient was accompanied to the clinic by *** who also provides collateral information.     History of Present Illness: Shawn Brooks is a 43 y.o. ***-handed African American male with *** presenting for evaluation of bilateral hand pain.    *** NCS/EMG of the arms in October 2019 was normal.  Out-side paper records, electronic medical record, and images have been reviewed where available and summarized as: *** Neuropsychological testing 03/28/2018:  Major depressive disorder, severe  NCS/EMG of the arms 04/27/2018:  Normal  Lab Results  Component Value Date   TSH 1.150 09/01/2017   Lab Results  Component Value Date   SWNIOEVO35 009 09/01/2017   Lab Results  Component Value Date   HGBA1C 5.6 05/02/2017    Past Medical History:  Diagnosis Date  . Anal condyloma   . Anxiety   . Blood dyscrasia    HIV  . Chronic back pain   . Depression   . DJD (degenerative joint disease)   . Gastric ulcer   . GERD (gastroesophageal reflux disease)   . Headache(784.0)   . Hiatal hernia   . HIV infection ([place])   . Hyperplastic colon polyp   . Hypertension   . Internal hemorrhoids     Past Surgical History:  Procedure Laterality Date  . ESOPHAGOGASTRODUODENOSCOPY  03/27/2012   Procedure: ESOPHAGOGASTRODUODENOSCOPY (EGD);  Surgeon: Lear Ng, MD;  Location: [facility];  Service: Endoscopy;  Laterality: N/A;  . IRRIGATION AND DEBRIDEMENT ABSCESS N/A 03/13/2015   Procedure: IRRIGATION AND DEBRIDEMENT ABSCESS;  Surgeon: Johnathan Hausen, MD;  Location: [facility];  Service: General;  Laterality: N/A;  . WISDOM  TOOTH EXTRACTION       Medications:  Outpatient Encounter Medications as of 07/17/2018  Medication Sig  . cyclobenzaprine (FLEXERIL) 10 MG tablet Take 1 tablet (10 mg total) by mouth 2 (two) times daily as needed for muscle spasms.  . diclofenac sodium (VOLTAREN) 1 % GEL Apply 2 g topically 2 (two) times daily as needed. (Patient not taking: Reported on 04/19/2018)  . diltiazem 2 % GEL Insert intra-anally twice daily to the 1st finger joint x 6-8 weeks.  . gabapentin (NEURONTIN) 300 MG capsule Take 1 capsule (300 mg total) by mouth 3 (three) times daily.  . GENVOYA 150-150-200-10 MG TABS tablet TAKE 1 TABLET BY MOUTH DAILY WITH PREZISTA  . pravastatin (PRAVACHOL) 40 MG tablet TAKE 1 TABLET BY MOUTH EVERY DAY  . PREZISTA 800 MG tablet TAKE 1 TABLET BY MOUTH EVERY DAY  . ranitidine (ZANTAC) 300 MG tablet Take 1 tablet (300 mg total) by mouth every 12 (twelve) hours.  . sildenafil (VIAGRA) 50 MG tablet 1 tab po 1/2 hr prior to intercourse. Limit use to 1 tab/24 hrs  . traZODone (DESYREL) 100 MG tablet Take 50 mg by mouth at bedtime.   . valACYclovir (VALTREX) 500 MG tablet TAKE 1 TABLET BY MOUTH TWICE DAILY AS NEEDED FOR FLARE UPS   No facility-administered encounter medications on file as of 07/17/2018.      Allergies:  Allergies  Allergen Reactions  . Oxycodone Itching    Family History: Family History  Problem Relation Age of Onset  . Cancer Mother        laryngeal  . Pneumonia Father   . Diabetes Father   . Hypertension Sister   . Crohn's disease Brother   . Crohn's disease Maternal Grandmother   . Cancer Maternal Grandmother        patient unsure of type  . Colon cancer Maternal Grandfather     Social History: Social History   Tobacco Use  . Smoking status: Current Every Day Smoker    Packs/day: 0.50    Types: Cigarettes  . Smokeless tobacco: Never Used  . Tobacco comment: not ready to quit  Substance Use Topics  . Alcohol use: Yes    Alcohol/week: 2.0 standard  drinks    Types: 2 Standard drinks or equivalent per week    Comment: beer at times. not often  . Drug use: Yes    Frequency: 7.0 times per week    Types: Marijuana    Comment: occ   Social History   Social History Narrative  . Not on file    Review of Systems:  CONSTITUTIONAL: No fevers, chills, night sweats, or weight loss.  *** EYES: No visual changes or eye pain ENT: No hearing changes.  No history of nose bleeds.   RESPIRATORY: No cough, wheezing and shortness of breath.   CARDIOVASCULAR: Negative for chest pain, and palpitations.   GI: Negative for abdominal discomfort, blood in stools or black stools.  No recent change in bowel habits.   GU:  No history of incontinence.   MUSCLOSKELETAL: No history of joint pain or swelling.  No myalgias.   SKIN: Negative for lesions, rash, and itching.   HEMATOLOGY/ONCOLOGY: Negative for prolonged bleeding, bruising easily, and swollen nodes.  No history of cancer.   ENDOCRINE: Negative for cold or heat intolerance, polydipsia or goiter.   PSYCH:  ***depression or anxiety symptoms.   NEURO: As Above.   Vital Signs:  There were no vitals taken for this visit.   General Medical Exam:   General:  Well appearing, comfortable.   Eyes/ENT: see cranial nerve examination.   Neck: No masses appreciated.  Full range of motion without tenderness.  No carotid bruits. Respiratory:  Clear to auscultation, good air entry bilaterally.   Cardiac:  Regular rate and rhythm, no murmur.   Extremities:  No deformities, edema, or skin discoloration.  Skin:  No rashes or lesions.  Neurological Exam: MENTAL STATUS including orientation to time, place, person, recent and remote memory, attention span and concentration, language, and fund of knowledge is normal.  Speech is not dysarthric.  CRANIAL NERVES: II:  No visual field defects.  Unremarkable fundi.   III-IV-VI: Pupils equal round and reactive to light.  Normal conjugate, extra-ocular eye movements  in all directions of gaze.  No nystagmus.  No ptosis.   V:  Normal facial sensation.     VII:  Normal facial symmetry and movements.  No pathologic facial reflexes.  VIII:  Normal hearing and vestibular function.   IX-X:  Normal palatal movement.   XI:  Normal shoulder shrug and head rotation.   XII:  Normal tongue strength and range of motion, no deviation or fasciculation.  MOTOR:  No atrophy, fasciculations or abnormal movements.  No pronator drift.  Tone is normal.    Right Upper Extremity:    Left Upper Extremity:    Deltoid  5/5   Deltoid  5/5   Biceps  5/5   Biceps  5/5  Triceps  5/5   Triceps  5/5   Wrist extensors  5/5   Wrist extensors  5/5   Wrist flexors  5/5   Wrist flexors  5/5   Finger extensors  5/5   Finger extensors  5/5   Finger flexors  5/5   Finger flexors  5/5   Dorsal interossei  5/5   Dorsal interossei  5/5   Abductor pollicis  5/5   Abductor pollicis  5/5   Tone (Ashworth scale)  0  Tone (Ashworth scale)  0   Right Lower Extremity:    Left Lower Extremity:    Hip flexors  5/5   Hip flexors  5/5   Hip extensors  5/5   Hip extensors  5/5   Knee flexors  5/5   Knee flexors  5/5   Knee extensors  5/5   Knee extensors  5/5   Dorsiflexors  5/5   Dorsiflexors  5/5   Plantarflexors  5/5   Plantarflexors  5/5   Toe extensors  5/5   Toe extensors  5/5   Toe flexors  5/5   Toe flexors  5/5   Tone (Ashworth scale)  0  Tone (Ashworth scale)  0   MSRs:  Right                                                                 Left brachioradialis 2+  brachioradialis 2+  biceps 2+  biceps 2+  triceps 2+  triceps 2+  patellar 2+  patellar 2+  ankle jerk 2+  ankle jerk 2+  Hoffman no  Hoffman no  plantar response down  plantar response down   SENSORY:  Normal and symmetric perception of light touch, pinprick, vibration, and proprioception.  Romberg's sign absent.   COORDINATION/GAIT: Normal finger-to- nose-finger.  Intact rapid alternating movements bilaterally.    Gait narrow based and stable. Tandem and stressed gait intact.    IMPRESSION: ***  PLAN/RECOMMENDATIONS:  *** Return to clinic in *** months.   The duration of this appointment visit was *** minutes of face-to-face time with the patient.  Greater than 50% of this time was spent in counseling, explanation of diagnosis, planning of further management, and coordination of care.   Thank you for allowing me to participate in patient's care.  If I can answer any additional questions, I would be pleased to do so.    Sincerely,    Kember Boch K. Posey Pronto, DO

## 2018-07-17 ENCOUNTER — Ambulatory Visit: Payer: Self-pay | Admitting: Neurology

## 2018-07-26 ENCOUNTER — Telehealth: Payer: Self-pay | Admitting: Internal Medicine

## 2018-07-26 MED ORDER — PANTOPRAZOLE SODIUM 40 MG PO TBEC: DELAYED_RELEASE_TABLET | ORAL | 1 refills | Status: DC

## 2018-07-26 NOTE — Telephone Encounter (Signed)
Pt advised that the med Pepcid is not working as good at the previous one and has already doubled. Would like to try something else.

## 2018-07-26 NOTE — Telephone Encounter (Signed)
Rx for pantoprazole sent to pharmacy. Patient advised of Dr Vena Rua recommendations and he verbalizes understanding. He also verbalizes understanding to call back should the addition of pantoprazole not help his symptoms.

## 2018-07-26 NOTE — Telephone Encounter (Signed)
Dr Hilarie Fredrickson, would you have me to increase Pepcid to 40 mg bid, give PPI instead or have him come to office to see extender?

## 2018-07-26 NOTE — Telephone Encounter (Signed)
Can restart pantoprazole 40 mg 30 minutes before breakfast and he can use Pepcid 20 to 40 mg in the evening He should try this for 2 to 3 weeks and let us know if not helpful

## 2018-08-01 ENCOUNTER — Emergency Department (HOSPITAL_BASED_OUTPATIENT_CLINIC_OR_DEPARTMENT_OTHER)
Admission: EM | Admit: 2018-08-01 | Discharge: 2018-08-01 | Disposition: A | Discharge status: Home / Self Care | Payer: Self-pay | Attending: Emergency Medicine | Admitting: Emergency Medicine

## 2018-08-01 ENCOUNTER — Other Ambulatory Visit: Payer: Self-pay

## 2018-08-01 ENCOUNTER — Encounter (HOSPITAL_BASED_OUTPATIENT_CLINIC_OR_DEPARTMENT_OTHER): Payer: Self-pay | Admitting: *Deleted

## 2018-08-01 ENCOUNTER — Emergency Department (HOSPITAL_BASED_OUTPATIENT_CLINIC_OR_DEPARTMENT_OTHER): Payer: Self-pay

## 2018-08-01 DIAGNOSIS — F1210 Cannabis abuse, uncomplicated: Secondary | ICD-10-CM | POA: Insufficient documentation

## 2018-08-01 DIAGNOSIS — B20 Human immunodeficiency virus [HIV] disease: Secondary | ICD-10-CM | POA: Insufficient documentation

## 2018-08-01 DIAGNOSIS — R197 Diarrhea, unspecified: Secondary | ICD-10-CM | POA: Insufficient documentation

## 2018-08-01 DIAGNOSIS — Z79899 Other long term (current) drug therapy: Secondary | ICD-10-CM | POA: Insufficient documentation

## 2018-08-01 DIAGNOSIS — R0981 Nasal congestion: Secondary | ICD-10-CM | POA: Insufficient documentation

## 2018-08-01 DIAGNOSIS — I129 Hypertensive chronic kidney disease with stage 1 through stage 4 chronic kidney disease, or unspecified chronic kidney disease: Secondary | ICD-10-CM | POA: Insufficient documentation

## 2018-08-01 DIAGNOSIS — M7918 Myalgia, other site: Secondary | ICD-10-CM | POA: Insufficient documentation

## 2018-08-01 DIAGNOSIS — R110 Nausea: Secondary | ICD-10-CM | POA: Insufficient documentation

## 2018-08-01 DIAGNOSIS — N183 Chronic kidney disease, stage 3 (moderate): Secondary | ICD-10-CM | POA: Insufficient documentation

## 2018-08-01 DIAGNOSIS — R69 Illness, unspecified: Secondary | ICD-10-CM

## 2018-08-01 DIAGNOSIS — F17210 Nicotine dependence, cigarettes, uncomplicated: Secondary | ICD-10-CM | POA: Insufficient documentation

## 2018-08-01 DIAGNOSIS — J111 Influenza due to unidentified influenza virus with other respiratory manifestations: Secondary | ICD-10-CM | POA: Insufficient documentation

## 2018-08-01 MED ORDER — ONDANSETRON HCL 4 MG/2ML IJ SOLN
4.0000 mg | Freq: Once | INTRAMUSCULAR | Status: AC
  Administered 2018-08-01: 4 mg via INTRAVENOUS
  Filled 2018-08-01: qty 2

## 2018-08-01 MED ORDER — ACETAMINOPHEN 325 MG PO TABS
650.0000 mg | ORAL_TABLET | Freq: Once | ORAL | Status: AC
  Administered 2018-08-01: 650 mg via ORAL
  Filled 2018-08-01: qty 2

## 2018-08-01 MED ORDER — ONDANSETRON 8 MG PO TBDP: 8.0000 mg | ORAL_TABLET | Freq: Three times a day (TID) | ORAL | 0 refills | Status: DC | PRN

## 2018-08-01 MED ORDER — BENZONATATE 100 MG PO CAPS: 100.0000 mg | ORAL_CAPSULE | Freq: Three times a day (TID) | ORAL | 0 refills | Status: DC

## 2018-08-01 NOTE — ED Provider Notes (Signed)
[facility] EMER[facility]ENC[facility] DEPARTMENT Provider Note   CSN: [id number] Arrival date & time: 08/01/18  7124     History   Chief Complaint Chief Complaint  Patient presents with  . Cough  . Influenza    HPI Shawn Brooks is a 43 y.o. male.  HPI Patient presented to the emergency room for evaluation of a flulike illness.  Patient has a history of HIV disease.  He is taking his medications.  Patient states his last viral load was undetectable.  CD4 count was greater than 500.  Patient states he had an influenza illness earlier this season.  Patient started having similar symptoms again on Saturday.  He felt feverish although was not able to take his temperature.  He had cough and congestion.  He has had some nausea and diarrhea.  He has had myalgias.  He denies any vomiting.  He denies any abdominal pain.  No chest pain.  No shortness of breath. Past Medical History:  Diagnosis Date  . Anal condyloma   . Anxiety   . Blood dyscrasia    HIV  . Chronic back pain   . Depression   . DJD (degenerative joint disease)   . Gastric ulcer   . GERD (gastroesophageal reflux disease)   . Headache(784.0)   . Hiatal hernia   . HIV infection ([place])   . Hyperplastic colon polyp   . Hypertension   . Internal hemorrhoids     Patient Active Problem List   Diagnosis Date Noted  . Finger laceration 01/09/2018  . Memory problem 09/01/2017  . Vitamin D deficiency 09/01/2017  . Primary osteoarthritis of left knee 05/02/2017  . Needs flu shot 05/02/2017  . Stage 3 chronic kidney disease ([place]) 05/02/2017  . Prediabetes 12/22/2016  . Diabetes mellitus screening 11/17/2016  . Tobacco abuse 05/31/2016  . Lumbar transverse process fracture ([place]) 10/02/2015  . Screening examination for sexually transmitted disease 04/04/2014  . Seizures ([place]) 08/30/2013  . Personal history of digestive disease 01/15/2013  . Thoracic or lumbosacral neuritis or radiculitis 01/15/2013  . H/O gastrointestinal  disease 01/15/2013  . Lumbar radiculopathy 01/15/2013  . 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 Condyloma acuminatum 11/17/2012  . 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 AGW (anogenital warts) 11/17/2012  . Hemorrhoid 09/27/2012  . BRBPR (bright red blood per rectum) 07/27/2012  . Prostatitis 04/12/2012  . Constipation 03/09/2012  . Callus 08/16/2011  . Ingrown toenail 08/16/2011  . Chronic low back pain 08/16/2011  . Dyslipidemia 09/03/2010  . Herpes simplex virus (HSV) infection 07/06/2010  . Erectile dysfunction 07/06/2010  . ANXIETY DEPRESSION 05/06/2010  . BURSITIS, KNEE 05/06/2010  . Human immunodeficiency virus (HIV) disease ([place]) 04/16/2010    Past Surgical History:  Procedure Laterality Date  . ESOPHAGOGASTRODUODENOSCOPY  03/27/2012   Procedure: ESOPHAGOGASTRODUODENOSCOPY (EGD);  Surgeon: Lear Ng, MD;  Location: [facility];  Service: Endoscopy;  Laterality: N/A;  . IRRIGATION AND DEBRIDEMENT ABSCESS N/A 03/13/2015   Procedure: IRRIGATION AND DEBRIDEMENT ABSCESS;  Surgeon: Johnathan Hausen, MD;  Location: [facility];  Service: General;  Laterality: N/A;  . WISDOM TOOTH EXTRACTION          Home Medications    Prior to Admission medications   Medication Sig Start Date End Date Taking? Authorizing Provider  cyclobenzaprine (FLEXERIL) 10 MG tablet Take 1 tablet (10 mg total) by mouth 2 (two) times daily as needed for muscle spasms. 05/01/18  Yes Ladell Pier, MD  gabapentin (NEURONTIN)  300 MG capsule Take 1 capsule (300 mg total) by mouth 3 (three) times daily. 11/17/16  Yes Langeland, Dawn T, MD  GENVOYA 150-150-200-10 MG TABS tablet TAKE 1 TABLET BY MOUTH DAILY WITH PREZISTA 07/11/18  Yes Comer, Okey Regal, MD  PREZISTA 800 MG tablet TAKE 1 TABLET BY MOUTH EVERY DAY 07/13/18  Yes Comer, Okey Regal, MD  ranitidine (ZANTAC) 300 MG tablet Take 1 tablet (300 mg total) by mouth every 12 (twelve) hours. 05/08/18  Yes Pyrtle, Lajuan Lines, MD    sildenafil (VIAGRA) 50 MG tablet 1 tab po 1/2 hr prior to intercourse. Limit use to 1 tab/24 hrs 01/11/18  Yes Ladell Pier, MD  traZODone (DESYREL) 100 MG tablet Take 50 mg by mouth at bedtime.  07/24/14  Yes [provider]  valACYclovir (VALTREX) 500 MG tablet TAKE 1 TABLET BY MOUTH TWICE DAILY AS NEEDED FOR FLARE UPS 01/09/18  Yes Comer, Okey Regal, MD  benzonatate (TESSALON) 100 MG capsule Take 1 capsule (100 mg total) by mouth every 8 (eight) hours. 08/01/18   Dorie Rank, MD  diclofenac sodium (VOLTAREN) 1 % GEL Apply 2 g topically 2 (two) times daily as needed. Patient not taking: Reported on 04/19/2018 04/03/18   Ladell Pier, MD  diltiazem 2 % GEL Insert intra-anally twice daily to the 1st finger joint x 6-8 weeks. 03/13/18   Pyrtle, Lajuan Lines, MD  ondansetron (ZOFRAN ODT) 8 MG disintegrating tablet Take 1 tablet (8 mg total) by mouth every 8 (eight) hours as needed for nausea or vomiting. 08/01/18   Dorie Rank, MD  pantoprazole (PROTONIX) 40 MG tablet Take 1 tablet by mouth 30 minutes before breakfast 07/26/18   Pyrtle, Lajuan Lines, MD  pravastatin (PRAVACHOL) 40 MG tablet TAKE 1 TABLET BY MOUTH EVERY DAY 03/15/18   Ladell Pier, MD    Family History Family History  Problem Relation Age of Onset  . Cancer Mother        laryngeal  . Pneumonia Father   . Diabetes Father   . Hypertension Sister   . Crohn's disease Brother   . Crohn's disease Maternal Grandmother   . Cancer Maternal Grandmother        patient unsure of type  . Colon cancer Maternal Grandfather     Social History Social History   Tobacco Use  . Smoking status: Current Every Day Smoker    Packs/day: 0.50    Types: Cigarettes  . Smokeless tobacco: Never Used  . Tobacco comment: not ready to quit  Substance Use Topics  . Alcohol use: Yes    Alcohol/week: 2.0 standard drinks    Types: 2 Standard drinks or equivalent per week    Comment: beer at times. not often  . Drug use: Yes    Frequency: 7.0 times  per week    Types: Marijuana    Comment: occ     Allergies   Oxycodone   Review of Systems Review of Systems  All other systems reviewed and are negative.    Physical Exam Updated Vital Signs BP 113/78 (BP Location: Left Arm)   Pulse 87   Temp 99.1 F (37.3 C) (Oral)   Resp 18   Ht 1.727 m (5\' 8" )   Wt 83.9 kg   SpO2 94%   BMI 28.13 kg/m   Physical Exam Vitals signs and nursing note reviewed.  Constitutional:      General: He is not in acute distress.    Appearance: He is well-developed.  HENT:  Head: Normocephalic and atraumatic.     Right Ear: External ear normal.     Left Ear: External ear normal.     Nose: Nose normal.     Mouth/Throat:     Mouth: Mucous membranes are moist.     Pharynx: No oropharyngeal exudate or posterior oropharyngeal erythema.  Eyes:     General: No scleral icterus.       Right eye: No discharge.        Left eye: No discharge.     Conjunctiva/sclera: Conjunctivae normal.  Neck:     Musculoskeletal: Neck supple.     Trachea: No tracheal deviation.  Cardiovascular:     Rate and Rhythm: Normal rate and regular rhythm.  Pulmonary:     Effort: Pulmonary effort is normal. No respiratory distress.     Breath sounds: Normal breath sounds. No stridor. No wheezing or rales.  Abdominal:     General: Bowel sounds are normal. There is no distension.     Palpations: Abdomen is soft.     Tenderness: There is no abdominal tenderness. There is no guarding or rebound.  Musculoskeletal:        General: No tenderness.  Skin:    General: Skin is warm and dry.     Findings: No rash.  Neurological:     Mental Status: He is alert.     Cranial Nerves: No cranial nerve deficit (no facial droop, extraocular movements intact, no slurred speech).     Sensory: No sensory deficit.     Motor: No abnormal muscle tone or seizure activity.     Coordination: Coordination normal.      ED Treatments / Results  Labs (all labs ordered are listed, but  only abnormal results are displayed) Labs Reviewed - No data to display  EKG None  Radiology Dg Chest 2 View  Result Date: 08/01/2018 CLINICAL DATA:  Cough and fever EXAM: CHEST - 2 VIEW COMPARISON:  04/14/2018 FINDINGS: Normal heart size and mediastinal contours. No acute infiltrate or edema. No effusion or pneumothorax. No acute osseous findings. IMPRESSION: Negative chest. Electronically Signed   By: Monte Fantasia M.D.   On: 08/01/2018 08:27    Procedures Procedures (including critical care time)  Medications Ordered in ED Medications  ondansetron (ZOFRAN) injection 4 mg (4 mg Intravenous Given 08/01/18 0759)     Initial Impression / Assessment and Plan / ED Course  I have reviewed the triage vital signs and the nursing notes.  Pertinent labs & imaging results that were available during my care of the patient were reviewed by me and considered in my medical decision making (see chart for details).   Patient presented to the emergency room for evaluation of cough and congestion.  Patient states he had the flu in December.  He feels like he is having similar symptoms.  Patient symptoms started on Saturday.  He is outside any acute treatment window for influenza.  He is nontoxic and afebrile.  Chest x-ray does not show pneumonia.  Patient does have history of HIV disease but his previous counts were normal.  He is not at risk for opportunistic infections.  It is possible the patient has another flu strain.  At this point he is well-appearing and is stable for outpatient symptomatic management.  Outpatient follow-up discussed.  An appointment with his doctor next week  Final Clinical Impressions(s) / ED Diagnoses   Final diagnoses:  Influenza-like illness    ED Discharge Orders  Ordered    ondansetron (ZOFRAN ODT) 8 MG disintegrating tablet  Every 8 hours PRN     08/01/18 0926    benzonatate (TESSALON) 100 MG capsule  Every 8 hours     08/01/18 0926           Dorie Rank, MD 08/01/18 [phone]

## 2018-08-01 NOTE — Discharge Instructions (Addendum)
Take the medications as prescribed, follow-up with your doctor next week if you are not improving.  Can also take over-the-counter medication such as Tylenol or ibuprofen for aches and pains.  Monitor for fever or shortness of breath or other concerning symptoms

## 2018-08-01 NOTE — ED Triage Notes (Signed)
Pt was diagnosed with the flu in December. He is having flu like symptoms all over again.

## 2018-08-04 ENCOUNTER — Encounter: Payer: Self-pay | Admitting: Internal Medicine

## 2018-08-04 ENCOUNTER — Other Ambulatory Visit: Payer: Self-pay

## 2018-08-04 ENCOUNTER — Ambulatory Visit: Payer: Self-pay

## 2018-08-04 ENCOUNTER — Other Ambulatory Visit: Payer: Self-pay | Admitting: Internal Medicine

## 2018-08-04 ENCOUNTER — Ambulatory Visit: Payer: Self-pay | Attending: Internal Medicine | Admitting: Internal Medicine

## 2018-08-04 VITALS — BP 109/73 | HR 82 | Temp 98.3°F | Resp 16 | Ht 68.0 in | Wt 200.8 lb

## 2018-08-04 DIAGNOSIS — E559 Vitamin D deficiency, unspecified: Secondary | ICD-10-CM | POA: Insufficient documentation

## 2018-08-04 DIAGNOSIS — F17210 Nicotine dependence, cigarettes, uncomplicated: Secondary | ICD-10-CM | POA: Insufficient documentation

## 2018-08-04 DIAGNOSIS — E785 Hyperlipidemia, unspecified: Secondary | ICD-10-CM | POA: Insufficient documentation

## 2018-08-04 DIAGNOSIS — Z885 Allergy status to narcotic agent status: Secondary | ICD-10-CM | POA: Insufficient documentation

## 2018-08-04 DIAGNOSIS — Z833 Family history of diabetes mellitus: Secondary | ICD-10-CM | POA: Insufficient documentation

## 2018-08-04 DIAGNOSIS — Z791 Long term (current) use of non-steroidal anti-inflammatories (NSAID): Secondary | ICD-10-CM | POA: Insufficient documentation

## 2018-08-04 DIAGNOSIS — I129 Hypertensive chronic kidney disease with stage 1 through stage 4 chronic kidney disease, or unspecified chronic kidney disease: Secondary | ICD-10-CM | POA: Insufficient documentation

## 2018-08-04 DIAGNOSIS — F17200 Nicotine dependence, unspecified, uncomplicated: Secondary | ICD-10-CM

## 2018-08-04 DIAGNOSIS — J209 Acute bronchitis, unspecified: Secondary | ICD-10-CM | POA: Insufficient documentation

## 2018-08-04 DIAGNOSIS — N183 Chronic kidney disease, stage 3 (moderate): Secondary | ICD-10-CM | POA: Insufficient documentation

## 2018-08-04 DIAGNOSIS — R0781 Pleurodynia: Secondary | ICD-10-CM

## 2018-08-04 DIAGNOSIS — B20 Human immunodeficiency virus [HIV] disease: Secondary | ICD-10-CM

## 2018-08-04 DIAGNOSIS — Z21 Asymptomatic human immunodeficiency virus [HIV] infection status: Secondary | ICD-10-CM | POA: Insufficient documentation

## 2018-08-04 DIAGNOSIS — N529 Male erectile dysfunction, unspecified: Secondary | ICD-10-CM | POA: Insufficient documentation

## 2018-08-04 DIAGNOSIS — Z79899 Other long term (current) drug therapy: Secondary | ICD-10-CM | POA: Insufficient documentation

## 2018-08-04 DIAGNOSIS — Z8249 Family history of ischemic heart disease and other diseases of the circulatory system: Secondary | ICD-10-CM | POA: Insufficient documentation

## 2018-08-04 MED ORDER — VARENICLINE TARTRATE 0.5 MG X 11 & 1 MG X 42 PO MISC: ORAL | 0 refills | Status: DC

## 2018-08-04 MED ORDER — ALBUTEROL SULFATE HFA 108 (90 BASE) MCG/ACT IN AERS: 2.0000 | INHALATION_SPRAY | Freq: Four times a day (QID) | RESPIRATORY_TRACT | 0 refills | Status: DC | PRN

## 2018-08-04 MED ORDER — PREDNISONE 10 MG PO TABS: ORAL_TABLET | ORAL | 0 refills | Status: DC

## 2018-08-04 MED ORDER — VARENICLINE TARTRATE 1 MG PO TABS: 1.0000 mg | ORAL_TABLET | Freq: Two times a day (BID) | ORAL | 1 refills | Status: DC

## 2018-08-04 MED FILL — !VENTOLIN HFA INHALER: 108 (90 BAS | 18 days supply | Qty: 18 | Fill #0

## 2018-08-04 MED FILL — predniSONE 10 MG TABS: 10 | 5 days supply | Qty: 7 | Fill #0

## 2018-08-04 MED FILL — !CHANTIX STARTING MONTH BOX: 0.5 MG X 11 | 28 days supply | Qty: 53 | Fill #0

## 2018-08-04 NOTE — Patient Instructions (Signed)
Use the prednisone and albuterol inhaler as discussed today.  Start the Chantix to help with smoking cessation.

## 2018-08-04 NOTE — Progress Notes (Signed)
Patient ID: ZYREN SEVIGNY, male    DOB: [birth year]  MRN: [id number]  CC: Hospitalization Follow-up (ED)   Subjective: Shawn Brooks is a 43 y.o. male who presents for ER f/u visit His concerns today include:  HIV, DJD, HTN, HL, anxiety, tob, vit D def, numbness in hands (neg EMG 2019), chronic BL LBP, rectal fissure  Started feeling sick about 1 wk ago with cough productive of yellow phlegm, nausea/diarrhea, body aches and congestion and subjective fever.  Several days later he also developed pleuritic chest pain in the center of the chest.  Seen in the ER for these symptoms 3 days ago and was diagnosed with flulike illness.  Chest x-ray was negative.  Conservative management recommended.  He was given some Zofran and Tessalon Perles to use as needed.  Still has CP, congestion and cough.  Taking Tylenol, Theraflu, Robatussin DM. CP constant is pleuritic in the center of the chest.  Worse when he breaths in and out, and when he smokes a cigarette.  No worse with coughing.   Out of work since Saturday, went back yesterday for 1/2 a day  Tobacco dependence: Smokes about two thirds of a pack a day.  Tried nicotine patches and lozenges in the past through the 1 800 Quit Now program.  He was not successful in quitting.  He would like to give a trial of quitting at this time but is concerned about cost of any medication. Patient Active Problem List   Diagnosis Date Noted  . Finger laceration 01/09/2018  . Memory problem 09/01/2017  . Vitamin D deficiency 09/01/2017  . Primary osteoarthritis of left knee 05/02/2017  . Needs flu shot 05/02/2017  . Stage 3 chronic kidney disease ([place]) 05/02/2017  . Prediabetes 12/22/2016  . Diabetes mellitus screening 11/17/2016  . Tobacco abuse 05/31/2016  . Lumbar transverse process fracture ([place]) 10/02/2015  . Screening examination for sexually transmitted disease 04/04/2014  . Seizures ([place]) 08/30/2013  . Personal history of digestive disease  01/15/2013  . Thoracic or lumbosacral neuritis or radiculitis 01/15/2013  . H/O gastrointestinal disease 01/15/2013  . Lumbar radiculopathy 01/15/2013  . 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 Condyloma acuminatum 11/17/2012  . 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 AGW (anogenital warts) 11/17/2012  . Hemorrhoid 09/27/2012  . BRBPR (bright red blood per rectum) 07/27/2012  . Prostatitis 04/12/2012  . Constipation 03/09/2012  . Callus 08/16/2011  . Ingrown toenail 08/16/2011  . Chronic low back pain 08/16/2011  . Dyslipidemia 09/03/2010  . Herpes simplex virus (HSV) infection 07/06/2010  . Erectile dysfunction 07/06/2010  . ANXIETY DEPRESSION 05/06/2010  . BURSITIS, KNEE 05/06/2010  . Human immunodeficiency virus (HIV) disease ([place]) 04/16/2010     Current Outpatient Medications on File Prior to Visit  Medication Sig Dispense Refill  . benzonatate (TESSALON) 100 MG capsule Take 1 capsule (100 mg total) by mouth every 8 (eight) hours. 21 capsule 0  . cyclobenzaprine (FLEXERIL) 10 MG tablet Take 1 tablet (10 mg total) by mouth 2 (two) times daily as needed for muscle spasms. 40 tablet 2  . diclofenac sodium (VOLTAREN) 1 % GEL Apply 2 g topically 2 (two) times daily as needed. (Patient not taking: Reported on 04/19/2018) 100 g 1  . diltiazem 2 % GEL Insert intra-anally twice daily to the 1st finger joint x 6-8 weeks. 30 g 0  . gabapentin (NEURONTIN) 300 MG capsule Take 1 capsule (300 mg total) by mouth 3 (three) times daily.  90 capsule 2  . GENVOYA 150-150-200-10 MG TABS tablet TAKE 1 TABLET BY MOUTH DAILY WITH PREZISTA 30 tablet 5  . ondansetron (ZOFRAN ODT) 8 MG disintegrating tablet Take 1 tablet (8 mg total) by mouth every 8 (eight) hours as needed for nausea or vomiting. 12 tablet 0  . pantoprazole (PROTONIX) 40 MG tablet Take 1 tablet by mouth 30 minutes before breakfast 30 tablet 1  . pravastatin (PRAVACHOL) 40 MG tablet TAKE 1 TABLET BY MOUTH EVERY  DAY 30 tablet 6  . PREZISTA 800 MG tablet TAKE 1 TABLET BY MOUTH EVERY DAY 30 tablet 0  . ranitidine (ZANTAC) 300 MG tablet Take 1 tablet (300 mg total) by mouth every 12 (twelve) hours. 60 tablet 11  . sildenafil (VIAGRA) 50 MG tablet 1 tab po 1/2 hr prior to intercourse. Limit use to 1 tab/24 hrs 30 tablet 3  . traZODone (DESYREL) 100 MG tablet Take 50 mg by mouth at bedtime.   2  . valACYclovir (VALTREX) 500 MG tablet TAKE 1 TABLET BY MOUTH TWICE DAILY AS NEEDED FOR FLARE UPS 60 tablet 5   No current facility-administered medications on file prior to visit.     Allergies  Allergen Reactions  . Oxycodone Itching    Social History   Socioeconomic History  . Marital status: Divorced    Spouse name: Not on file  . Number of children: 1  . Years of education: Not on file  . Highest education level: Not on file  Occupational History  . Occupation: Disabled  Social Needs  . Financial resource strain: Not on file  . Food insecurity:    Worry: Not on file    Inability: Not on file  . Transportation needs:    Medical: Not on file    Non-medical: Not on file  Tobacco Use  . Smoking status: Current Every Day Smoker    Packs/day: 0.50    Types: Cigarettes  . Smokeless tobacco: Never Used  . Tobacco comment: not ready to quit  Substance and Sexual Activity  . Alcohol use: Yes    Alcohol/week: 2.0 standard drinks    Types: 2 Standard drinks or equivalent per week    Comment: beer at times. not often  . Drug use: Yes    Frequency: 7.0 times per week    Types: Marijuana    Comment: occ  . Sexual activity: Not Currently    Partners: Male    Comment: accepted condoms  Lifestyle  . Physical activity:    Days per week: Not on file    Minutes per session: Not on file  . Stress: Not on file  Relationships  . Social connections:    Talks on phone: Not on file    Gets together: Not on file    Attends religious service: Not on file    Active member of club or organization: Not  on file    Attends meetings of clubs or organizations: Not on file    Relationship status: Not on file  . Intimate partner violence:    Fear of current or ex partner: Not on file    Emotionally abused: Not on file    Physically abused: Not on file    Forced sexual activity: Not on file  Other Topics Concern  . Not on file  Social History Narrative  . Not on file    Family History  Problem Relation Age of Onset  . Cancer Mother        laryngeal  .  Pneumonia Father   . Diabetes Father   . Hypertension Sister   . Crohn's disease Brother   . Crohn's disease Maternal Grandmother   . Cancer Maternal Grandmother        patient unsure of type  . Colon cancer Maternal Grandfather     Past Surgical History:  Procedure Laterality Date  . ESOPHAGOGASTRODUODENOSCOPY  03/27/2012   Procedure: ESOPHAGOGASTRODUODENOSCOPY (EGD);  Surgeon: Lear Ng, MD;  Location: [facility];  Service: Endoscopy;  Laterality: N/A;  . IRRIGATION AND DEBRIDEMENT ABSCESS N/A 03/13/2015   Procedure: IRRIGATION AND DEBRIDEMENT ABSCESS;  Surgeon: Johnathan Hausen, MD;  Location: [facility];  Service: General;  Laterality: N/A;  . WISDOM TOOTH EXTRACTION      ROS: Review of Systems Negative except as above. PHYSICAL EXAM: BP 109/73   Pulse 82   Temp 98.3 F (36.8 C) (Oral)   Resp 16   Ht 5\' 8"  (1.727 m)   Wt 200 lb 12.8 oz (91.1 kg)   SpO2 94%   BMI 30.53 kg/m   Wt Readings from Last 3 Encounters:  08/04/18 200 lb 12.8 oz (91.1 kg)  08/01/18 185 lb (83.9 kg)  05/01/18 206 lb 9.6 oz (93.7 kg)     Physical Exam  General appearance - alert, well appearing, middle-aged African-American male in NAD.  Patient with mild audible congestion.   Mental status - normal mood, behavior, speech, dress, motor activity, and thought processes Nose -clear mucus. Mouth - mucous membranes moist, pharynx normal without lesions Neck - supple, no significant adenopathy Chest -mild diffuse expiratory wheezes with  some rhonchi Heart -regular rate and rhythm.  No friction rub appreciated.  No reproducible tenderness on palpation of the anterior chest wall.  Lab Results  Component Value Date   WBC 10.9 (H) 04/14/2018   HGB 15.7 04/14/2018   HCT 48.6 04/14/2018   MCV 91.5 04/14/2018   PLT 207 04/14/2018     Chemistry      Component Value Date/Time   NA 140 05/15/2018 1018   NA 142 05/02/2017 1051   K 4.3 05/15/2018 1018   CL 105 05/15/2018 1018   CO2 26 05/15/2018 1018   BUN 13 05/15/2018 1018   BUN 12 05/02/2017 1051   CREATININE 1.16 05/15/2018 1018      Component Value Date/Time   CALCIUM 9.4 05/15/2018 1018   ALKPHOS 82 01/04/2018 0811   AST 20 05/15/2018 1018   ALT 15 05/15/2018 1018   BILITOT 0.3 05/15/2018 1018   BILITOT 0.3 05/02/2017 1051     EKG: Normal sinus rhythm without acute ischemic changes.  No signs of pericarditis..  EKG appears unchanged when compared to EKG done 03/2018.  ASSESSMENT AND PLAN: 1. Bronchitis, acute, with bronchospasm - predniSONE (DELTASONE) 10 MG tablet; 3 tabs PO x 1 day then 2 tabs PO x 2 days then 1 tab PO x 2 days  Dispense: 7 tablet; Refill: 0 - albuterol (PROVENTIL HFA;VENTOLIN HFA) 108 (90 Base) MCG/ACT inhaler; Inhale 2 puffs into the lungs every 6 (six) hours as needed for wheezing or shortness of breath.  Dispense: 1 Inhaler; Refill: 0  2. Tobacco dependence Patient advised to quit smoking. Discussed health risks associated with smoking including lung and other types of cancers, chronic lung diseases and CV risks.. Pt ready to give trail of quitting.  Discussed methods to help quit including quitting cold [place], use of NRT, Chantix and Bupropion.  He is willing to try Chantix.  I went over possible side effects  including mood swings and bad dreams. About 3 minutes spent on smoking cessation counseling. - varenicline (CHANTIX STARTING MONTH PAK) 0.5 MG X 11 & 1 MG X 42 tablet; one 0.5 mg tab PO once daily for 3 days, then one 0.5 mg tablet  twice daily for 4 days, then one 1 mg tablet twice daily.  Dispense: 53 tablet; Refill: 0 - varenicline (CHANTIX CONTINUING MONTH PAK) 1 MG tablet; Take 1 tablet (1 mg total) by mouth 2 (two) times daily.  Dispense: 60 tablet; Refill: 1  3. Pleuritic chest pain  - EKG 12-Lead   Patient was given the opportunity to ask questions.  Patient verbalized understanding of the plan and was able to repeat key elements of the plan.   No orders of the defined types were placed in this encounter.    Requested Prescriptions    No prescriptions requested or ordered in this encounter    No follow-ups on file.  Karle Plumber, MD, FACP

## 2018-08-08 ENCOUNTER — Encounter: Payer: Self-pay | Admitting: Internal Medicine

## 2018-08-08 ENCOUNTER — Ambulatory Visit (INDEPENDENT_AMBULATORY_CARE_PROVIDER_SITE_OTHER): Payer: Self-pay | Admitting: Internal Medicine

## 2018-08-08 VITALS — BP 124/80 | HR 72 | Temp 98.0°F | Ht 68.0 in | Wt 204.2 lb

## 2018-08-08 DIAGNOSIS — B20 Human immunodeficiency virus [HIV] disease: Secondary | ICD-10-CM

## 2018-08-08 DIAGNOSIS — Z720 Tobacco use: Secondary | ICD-10-CM

## 2018-08-08 DIAGNOSIS — Z5181 Encounter for therapeutic drug level monitoring: Secondary | ICD-10-CM

## 2018-08-08 DIAGNOSIS — Z113 Encounter for screening for infections with a predominantly sexual mode of transmission: Secondary | ICD-10-CM

## 2018-08-08 MED ORDER — BUPROPION HCL ER (SR) 150 MG PO TB12: 150.0000 mg | ORAL_TABLET | Freq: Two times a day (BID) | ORAL | 2 refills | Status: DC

## 2018-08-08 NOTE — Assessment & Plan Note (Signed)
Discussed cessation and will give Wellbutrin

## 2018-08-08 NOTE — Progress Notes (Signed)
   Subjective:    Patient ID: Shawn Brooks, male    DOB: 17-Feb-1976, 43 y.o.   MRN: [id number]  HPI Here for follow up of HIV Has been on a salvage regimen with Genvoya and Prezista with no missed doses. CD4 of 780 and vrial load < 20.  No associated n/d.  No rashes.  Uses condoms with sex always and no recent sexual activity.  No penile, warts.  Wants to quit smoking  Review of Systems  Gastrointestinal: Negative for diarrhea and nausea.  Skin: Negative for rash.       Objective:   Physical Exam  Constitutional: He appears well-developed and well-nourished. No distress.  HENT:  Mouth/Throat: No oropharyngeal exudate.  Eyes: No scleral icterus.  Cardiovascular: Normal rate, regular rhythm and normal heart sounds.  No murmur heard. Pulmonary/Chest: Effort normal and breath sounds normal. No respiratory distress.  Skin: No rash noted.   SH: + tobacco       Assessment & Plan:

## 2018-08-08 NOTE — Assessment & Plan Note (Signed)
Creat, LFts wnl.

## 2018-08-08 NOTE — Assessment & Plan Note (Signed)
Doing well with recent labs.  He can rtc in 6 months.

## 2018-10-05 ENCOUNTER — Other Ambulatory Visit: Payer: Self-pay | Admitting: Internal Medicine

## 2018-10-05 DIAGNOSIS — E782 Mixed hyperlipidemia: Secondary | ICD-10-CM

## 2018-10-31 IMAGING — CT CT ABD-PELV W/ CM
2 of 5 series · 16 of 46 positions shown, 18 images · IV contrast (iopamidol)
Comparison: CT scan 07/31/2007

CLINICAL DATA: Right upper quadrant pain, weight loss,
constipation, positive HIV, gastroesophageal reflux disease

EXAM:
CT ABDOMEN AND PELVIS WITH CONTRAST
TECHNIQUE: Multidetector CT imaging of the abdomen and pelvis was performed
using the standard protocol following bolus administration of
intravenous contrast.
CONTRAST:  100mL 1CTGDP-FHH IOPAMIDOL (1CTGDP-FHH) INJECTION 61%

[Series 2: axial st · axial · 0.80mm/px · z∈[-681,-281]mm · 13 of 94 slices shown, 15 images]
[im 7/94  soft-tissue]
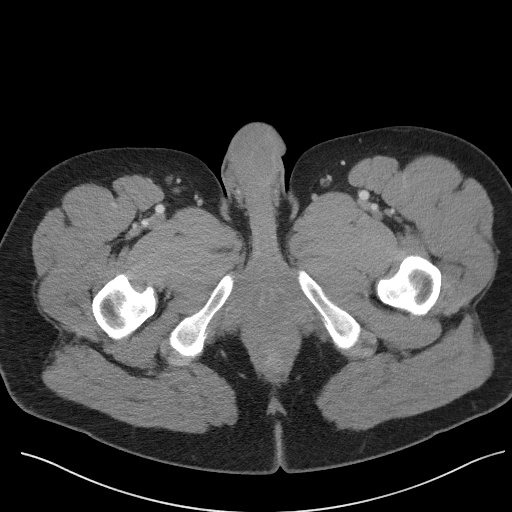
[im 7/94  bone]
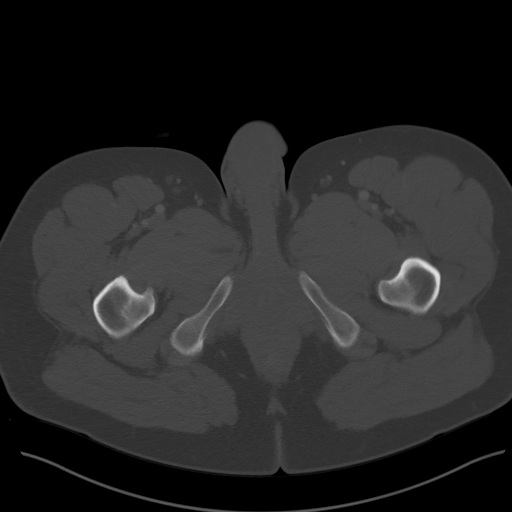
[im 14/94  soft-tissue]
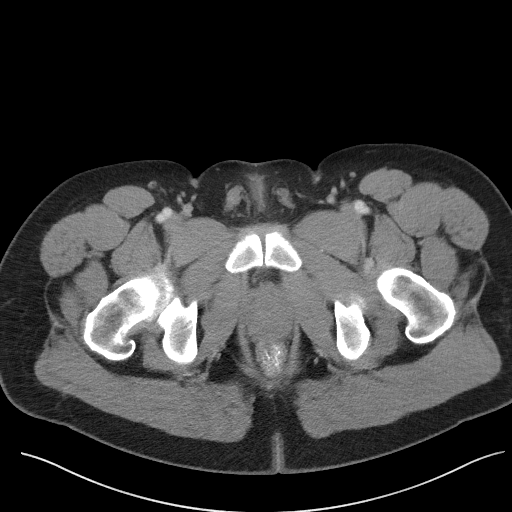
[im 20/94  soft-tissue]
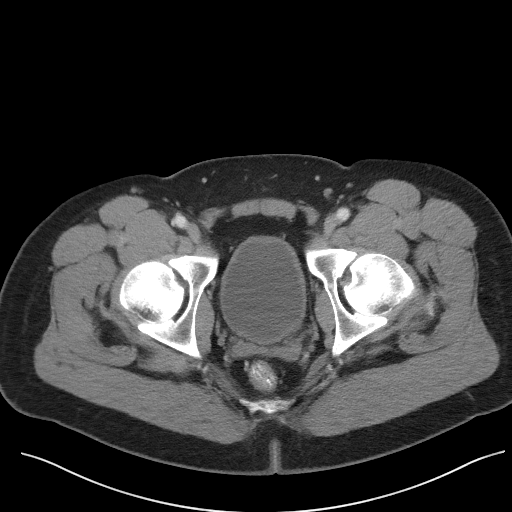
[im 27/94  soft-tissue]
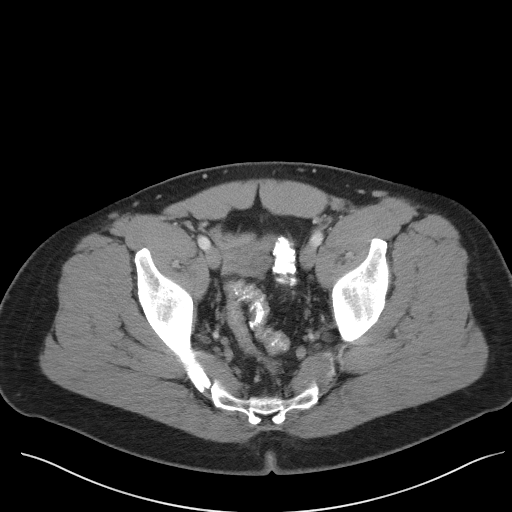
[im 34/94  soft-tissue]
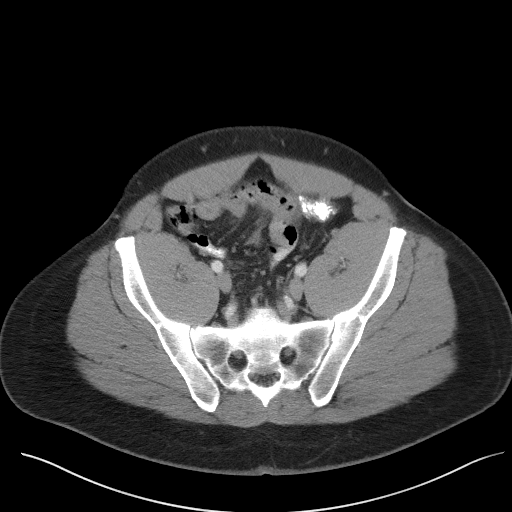
[im 40/94  soft-tissue]
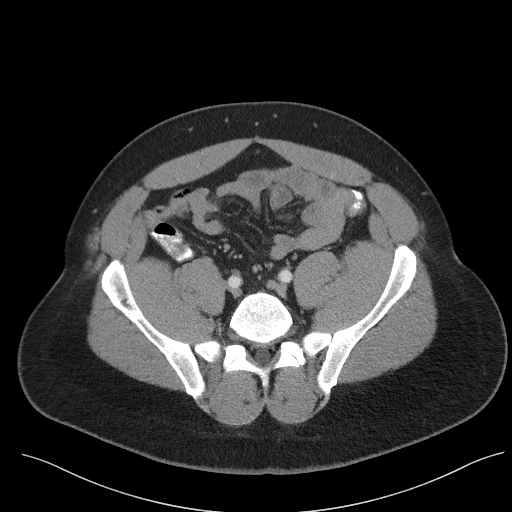
[im 47/94  soft-tissue]
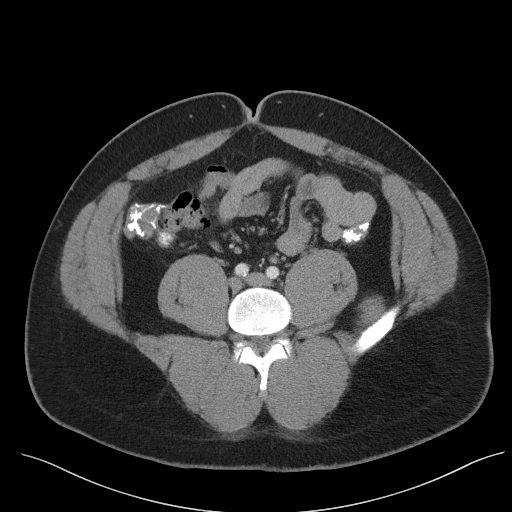
[im 54/94  soft-tissue]
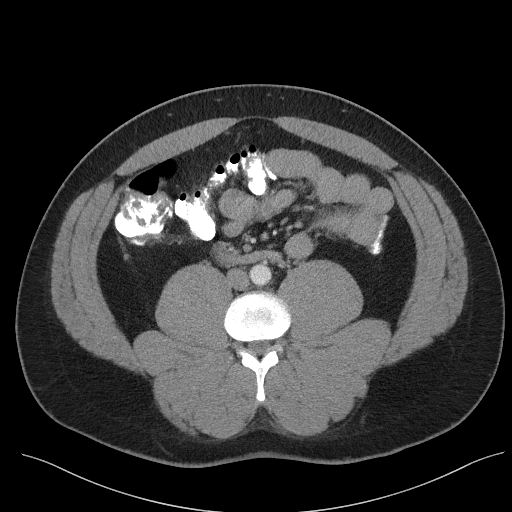
[im 60/94  soft-tissue]
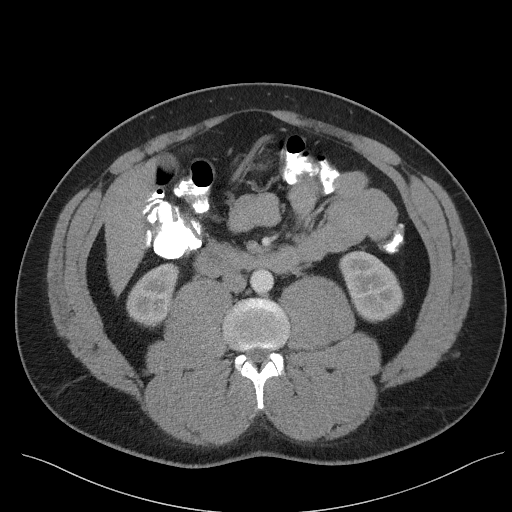
[im 60/94  bone]
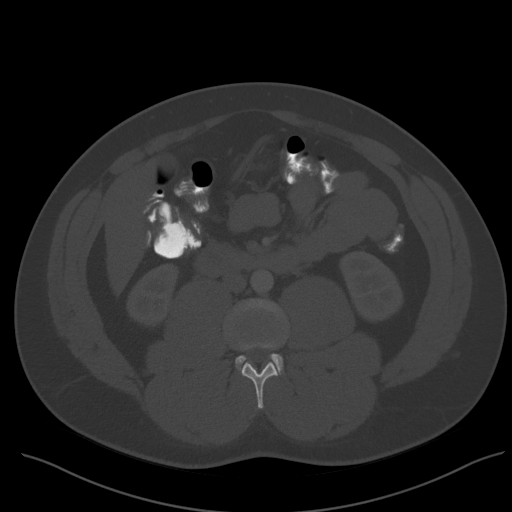
[im 67/94  soft-tissue]
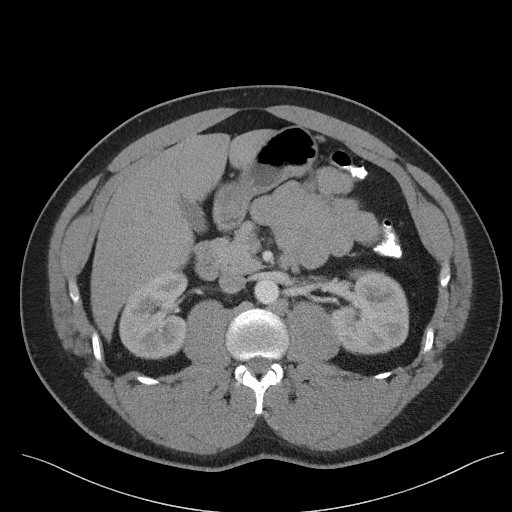
[im 74/94  soft-tissue]
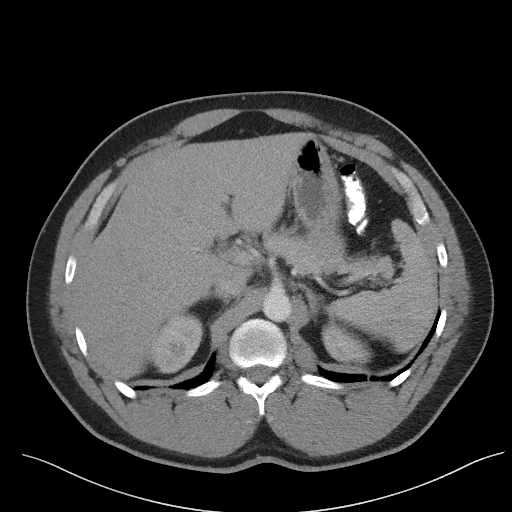
[im 80/94  soft-tissue]
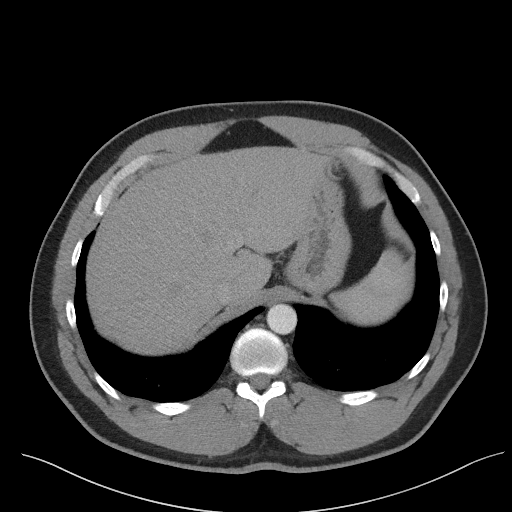
[im 87/94  soft-tissue]
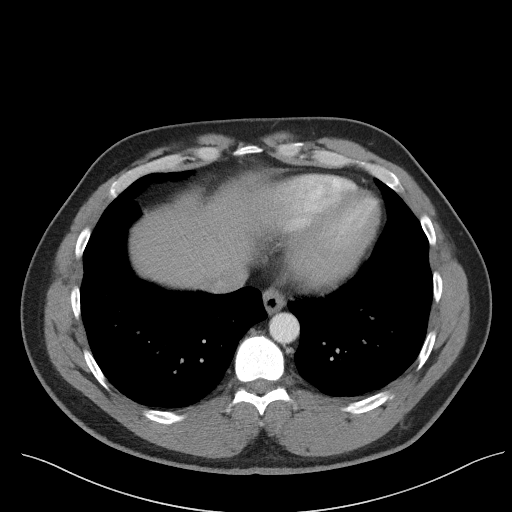

[Series 4: coronal st · coronal · 0.80mm/px · 3 of 89 slices shown]
[im 30/89  soft-tissue]
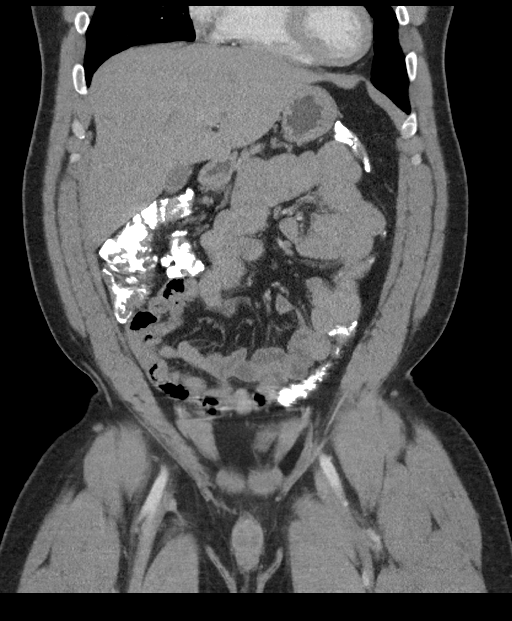
[im 40/89  soft-tissue]
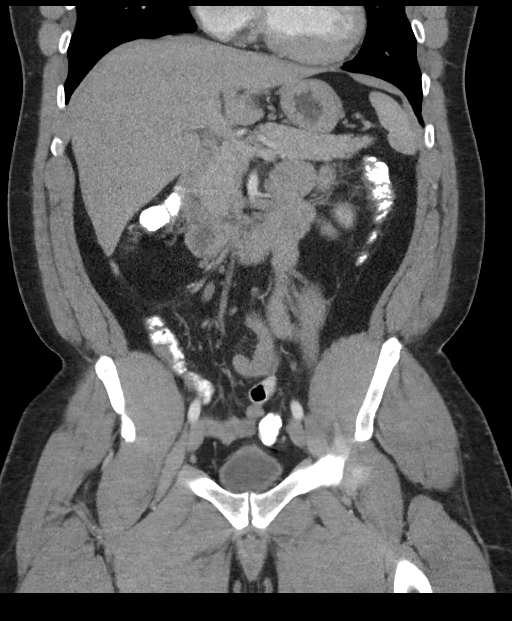
[im 49/89  soft-tissue]
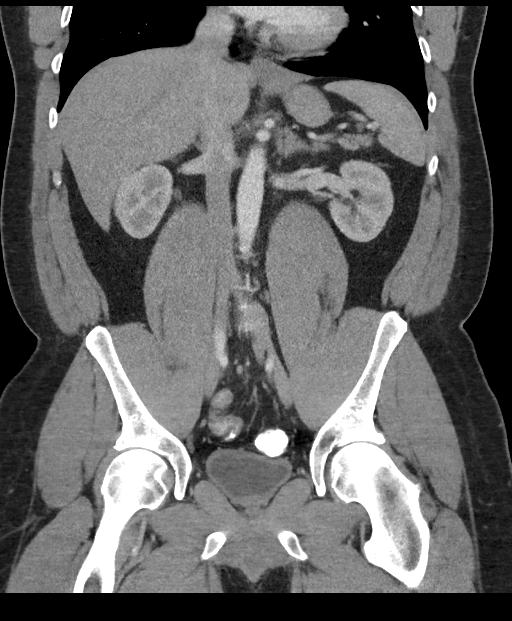

[16 of 46 positions shown; findings below may reference images not displayed]

FINDINGS: Lower chest: Lung bases shows no acute findings.

Hepatobiliary: Enhanced liver shows no focal abnormality. No
calcified gallstones are noted within gallbladder.

Pancreas: Enhanced pancreas without focal abnormality. No
peripancreatic inflammatory changes.

Spleen: Enhanced spleen with normal appearance.

Adrenals/Urinary Tract: No adrenal gland mass. Enhanced kidneys are
symmetrical in size. No hydronephrosis or hydroureter. Delayed renal
images shows bilateral renal symmetrical excretion. Bilateral
visualized proximal ureter is unremarkable. The urinary bladder is
unremarkable. Bilateral distal ureter is

Stomach/Bowel: No small bowel obstruction. Oral contrast material
was given to the patient. No thickened or dilated small bowel loops.
Contrast material noted throughout the colon. Terminal ileum is
unremarkable. Normal retrocecal appendix is noted in axial image 42.
No distal colitis or diverticulitis. No distal colonic obstruction.

Vascular/Lymphatic: No retroperitoneal or mesenteric adenopathy. No
aortic aneurysm. No inguinal adenopathy.

Reproductive: Prostate gland and seminal vesicles are unremarkable.
No pelvic masses noted.

Other: No ascites or free abdominal air

Musculoskeletal: Sagittal images of the spine shows no destructive
bony lesions. Minimal disc bulge scratch minimal posterior disc
bulge at L5-S1 level.
IMPRESSION: 1. There is no evidence of acute inflammatory process within
abdomen.
2. No hydronephrosis or hydroureter.
3. No small bowel obstruction.
4. Normal appendix.  No pericecal inflammation.
5. No distal colitis or diverticulitis. No distal colonic
obstruction.
6. No ascites or free abdominal air.

## 2018-11-24 ENCOUNTER — Other Ambulatory Visit: Payer: Self-pay | Admitting: Internal Medicine

## 2018-11-24 DIAGNOSIS — F341 Dysthymic disorder: Secondary | ICD-10-CM

## 2018-11-30 ENCOUNTER — Other Ambulatory Visit: Payer: Self-pay | Admitting: Internal Medicine

## 2018-11-30 DIAGNOSIS — B20 Human immunodeficiency virus [HIV] disease: Secondary | ICD-10-CM

## 2018-11-30 DIAGNOSIS — E782 Mixed hyperlipidemia: Secondary | ICD-10-CM

## 2018-12-07 ENCOUNTER — Encounter: Payer: Self-pay | Admitting: Internal Medicine

## 2018-12-26 ENCOUNTER — Ambulatory Visit (INDEPENDENT_AMBULATORY_CARE_PROVIDER_SITE_OTHER): Payer: Self-pay | Admitting: Internal Medicine

## 2018-12-26 ENCOUNTER — Encounter: Payer: Self-pay | Admitting: Internal Medicine

## 2018-12-26 ENCOUNTER — Other Ambulatory Visit: Payer: Self-pay

## 2018-12-26 DIAGNOSIS — R350 Frequency of micturition: Secondary | ICD-10-CM

## 2018-12-26 DIAGNOSIS — R197 Diarrhea, unspecified: Secondary | ICD-10-CM | POA: Insufficient documentation

## 2018-12-26 DIAGNOSIS — R369 Urethral discharge, unspecified: Secondary | ICD-10-CM

## 2018-12-26 MED ORDER — CEFTRIAXONE SODIUM 250 MG IJ SOLR
250.0000 mg | Freq: Once | INTRAMUSCULAR | Status: AC
  Administered 2018-12-26: 250 mg via INTRAMUSCULAR

## 2018-12-26 MED ORDER — AZITHROMYCIN 250 MG PO TABS
1000.0000 mg | ORAL_TABLET | Freq: Once | ORAL | Status: AC
  Administered 2018-12-26: 1000 mg via ORAL

## 2018-12-26 NOTE — Assessment & Plan Note (Signed)
Will check a PCR panel.

## 2018-12-26 NOTE — Assessment & Plan Note (Signed)
Will empirically treat with ceftriaxone and azithrojmycin. Will test as well including RPR, urine cytology

## 2018-12-26 NOTE — Assessment & Plan Note (Signed)
I will check a UA, PSA with ? Prostatitis.  Will consider treatment if concerns.  Otherwise will have him see urology.

## 2018-12-26 NOTE — Progress Notes (Signed)
   Subjective:    Patient ID: Athena Masse, male    DOB: 1976/04/29, 43 y.o.   MRN: [id number]  HPI Here for a work in visit. Has had diarrhea about 2 weeks.  Endorses a 10 lb weight loss.  Some occasional bloody stool and reports being worked up for in the past.  Also now penile discharge last 2 days.  Endorses sexual activity.  No receptive anal intercourse but some contact/rubbing.   Also complaint of urinary frequency for many months.  Has been a constant problem.  Sometimes with a normal stream, sometimes little.  Some urgency.  All this prior to above complaints.  No new medicines prior to this.     Review of Systems  Constitutional: Negative for chills and fever.  Gastrointestinal: Positive for diarrhea. Negative for abdominal pain and rectal pain.  Skin: Negative for rash.       Objective:   Physical Exam Constitutional:      Appearance: Normal appearance.  Eyes:     General: No scleral icterus. Cardiovascular:     Rate and Rhythm: Normal rate and regular rhythm.     Heart sounds: No murmur.  Pulmonary:     Effort: Pulmonary effort is normal. No respiratory distress.     Breath sounds: Normal breath sounds.  Skin:    Findings: No rash.  Neurological:     Mental Status: He is alert.   SH: + tobacco        Assessment & Plan:

## 2018-12-27 ENCOUNTER — Other Ambulatory Visit: Payer: Self-pay

## 2018-12-27 LAB — URINALYSIS, ROUTINE W REFLEX MICROSCOPIC
Bacteria, UA: NONE SEEN /HPF
Bilirubin Urine: NEGATIVE
Glucose, UA: NEGATIVE
Hgb urine dipstick: NEGATIVE
Hyaline Cast: NONE SEEN /LPF
Ketones, ur: NEGATIVE
Nitrite: NEGATIVE
Protein, ur: NEGATIVE
RBC / HPF: NONE SEEN /HPF (ref 0–2)
Specific Gravity, Urine: 1.015 (ref 1.001–1.03)
pH: 6.5 (ref 5.0–8.0)

## 2018-12-27 LAB — CYTOLOGY, (ORAL, ANAL, URETHRAL) ANCILLARY ONLY
Chlamydia: NEGATIVE
Chlamydia: NEGATIVE
Neisseria Gonorrhea: NEGATIVE
Neisseria Gonorrhea: NEGATIVE

## 2018-12-27 LAB — URINE CYTOLOGY ANCILLARY ONLY
Chlamydia: NEGATIVE
Neisseria Gonorrhea: NEGATIVE

## 2018-12-28 ENCOUNTER — Other Ambulatory Visit: Payer: Self-pay

## 2018-12-28 ENCOUNTER — Ambulatory Visit (INDEPENDENT_AMBULATORY_CARE_PROVIDER_SITE_OTHER): Payer: Self-pay

## 2018-12-28 ENCOUNTER — Telehealth: Payer: Self-pay | Admitting: *Deleted

## 2018-12-28 DIAGNOSIS — A64 Unspecified sexually transmitted disease: Secondary | ICD-10-CM

## 2018-12-28 LAB — FLUORESCENT TREPONEMAL AB(FTA)-IGG-BLD: Fluorescent Treponemal ABS: NONREACTIVE

## 2018-12-28 LAB — GASTROINTESTINAL PATHOGEN PANEL PCR
C. difficile Tox A/B, PCR: NOT DETECTED
Campylobacter, PCR: NOT DETECTED
Cryptosporidium, PCR: NOT DETECTED
E coli (ETEC) LT/ST PCR: NOT DETECTED
E coli (STEC) stx1/stx2, PCR: NOT DETECTED
E coli 0157, PCR: NOT DETECTED
Giardia lamblia, PCR: NOT DETECTED
Norovirus, PCR: NOT DETECTED
Rotavirus A, PCR: NOT DETECTED
Salmonella, PCR: NOT DETECTED
Shigella, PCR: NOT DETECTED

## 2018-12-28 LAB — RPR: RPR Ser Ql: REACTIVE — AB

## 2018-12-28 LAB — PSA: PSA: 2.4 ng/mL (ref <=4.0)

## 2018-12-28 LAB — RPR TITER: RPR Titer: 1:1 {titer} — ABNORMAL HIGH

## 2018-12-28 MED ORDER — PENICILLIN G BENZATHINE 1200000 UNIT/2ML IM SUSP
1.2000 10*6.[IU] | Freq: Once | INTRAMUSCULAR | Status: AC
  Administered 2018-12-28: 2.4 10*6.[IU] via INTRAMUSCULAR

## 2018-12-28 NOTE — Progress Notes (Signed)
Patient in office today for nurse visit. Chart reviewed. +RPR. Per Dr. 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  Though no titer yet or confirmation, he has not previously been positive so would like to treat him with 1 dose of bicillin. 2.4 million units infection one time dose administered today. Patient encouraged to abstain from sexually activity for 7-10 days after treatment. Condoms accepted. Encouraged to make all recent partners aware to be tested and treated at [facility] or PCP. Patient voiced understanding. Patient tolerated injections well.  Eugenia Mcalpine, LPN

## 2018-12-28 NOTE — Telephone Encounter (Signed)
Called patient per [place] and advised him of his +RPR. Advised of need for treatment and he will come today 12/28/18 at 11 am.

## 2018-12-28 NOTE — Patient Instructions (Signed)
Avoid sexually contact up to 7 days after treatment. You may use warm compress to site, use tylenol as needed.

## 2018-12-28 NOTE — Telephone Encounter (Signed)
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  Though no titer yet or confirmation, he has not previously been positive so would like to treat him with 1 dose of bicillin today.   thanks

## 2019-01-02 ENCOUNTER — Ambulatory Visit: Payer: Self-pay

## 2019-01-02 ENCOUNTER — Other Ambulatory Visit: Payer: Self-pay

## 2019-01-04 ENCOUNTER — Ambulatory Visit: Payer: Self-pay

## 2019-01-24 ENCOUNTER — Encounter: Payer: Self-pay | Admitting: Internal Medicine

## 2019-01-26 ENCOUNTER — Other Ambulatory Visit: Payer: Self-pay | Admitting: Internal Medicine

## 2019-01-26 DIAGNOSIS — B20 Human immunodeficiency virus [HIV] disease: Secondary | ICD-10-CM

## 2019-01-31 ENCOUNTER — Other Ambulatory Visit: Payer: Self-pay | Admitting: Internal Medicine

## 2019-01-31 DIAGNOSIS — B009 Herpesviral infection, unspecified: Secondary | ICD-10-CM

## 2019-02-06 ENCOUNTER — Other Ambulatory Visit: Payer: Self-pay

## 2019-02-06 DIAGNOSIS — Z113 Encounter for screening for infections with a predominantly sexual mode of transmission: Secondary | ICD-10-CM

## 2019-02-06 DIAGNOSIS — B20 Human immunodeficiency virus [HIV] disease: Secondary | ICD-10-CM

## 2019-02-07 LAB — T-HELPER CELL (CD4) - (RCID CLINIC ONLY)
CD4 % Helper T Cell: 33 % (ref 33–65)
CD4 T Cell Abs: 802 /uL (ref 400–1790)

## 2019-02-08 LAB — URINE CYTOLOGY ANCILLARY ONLY
Chlamydia: NEGATIVE
Neisseria Gonorrhea: NEGATIVE

## 2019-02-09 LAB — HIV-1 RNA QUANT-NO REFLEX-BLD
HIV 1 RNA Quant: <20 copies/mL
HIV-1 RNA Quant, Log: <1.3 Log copies/mL

## 2019-02-09 LAB — RPR: RPR Ser Ql: NONREACTIVE

## 2019-02-19 ENCOUNTER — Other Ambulatory Visit: Payer: Self-pay | Admitting: Internal Medicine

## 2019-02-19 ENCOUNTER — Telehealth: Payer: Self-pay | Admitting: Internal Medicine

## 2019-02-19 DIAGNOSIS — B20 Human immunodeficiency virus [HIV] disease: Secondary | ICD-10-CM

## 2019-02-19 NOTE — Telephone Encounter (Signed)
COVID-19 Pre-Screening Questions: ° °Do you currently have a fever (>100 °F), chills or unexplained body aches? N ° °Are you currently experiencing new cough, shortness of breath, sore throat, runny nose? N °•  °Have you recently travelled outside the state of [place] in the last 14 days? N  °•  °Have you been in contact with someone that is currently pending confirmation of Covid19 testing or has been confirmed to have the Covid19 virus?  N ° °**If the patient answers NO to ALL questions -  advise the patient to please call the clinic before coming to the office should any symptoms develop.  ° ° ° °

## 2019-02-20 ENCOUNTER — Other Ambulatory Visit: Payer: Self-pay

## 2019-02-20 ENCOUNTER — Ambulatory Visit (INDEPENDENT_AMBULATORY_CARE_PROVIDER_SITE_OTHER): Payer: Self-pay | Admitting: Internal Medicine

## 2019-02-20 ENCOUNTER — Encounter: Payer: Self-pay | Admitting: Internal Medicine

## 2019-02-20 VITALS — BP 109/75 | HR 81

## 2019-02-20 DIAGNOSIS — E785 Hyperlipidemia, unspecified: Secondary | ICD-10-CM

## 2019-02-20 DIAGNOSIS — Z720 Tobacco use: Secondary | ICD-10-CM

## 2019-02-20 DIAGNOSIS — Z23 Encounter for immunization: Secondary | ICD-10-CM

## 2019-02-20 DIAGNOSIS — Z113 Encounter for screening for infections with a predominantly sexual mode of transmission: Secondary | ICD-10-CM

## 2019-02-20 DIAGNOSIS — B20 Human immunodeficiency virus [HIV] disease: Secondary | ICD-10-CM

## 2019-02-20 DIAGNOSIS — B009 Herpesviral infection, unspecified: Secondary | ICD-10-CM

## 2019-02-20 DIAGNOSIS — R350 Frequency of micturition: Secondary | ICD-10-CM

## 2019-02-20 MED ORDER — VALACYCLOVIR HCL 500 MG PO TABS: ORAL_TABLET | ORAL | 11 refills | Status: DC

## 2019-02-20 MED ORDER — GENVOYA 150-150-200-10 MG PO TABS: ORAL_TABLET | ORAL | 11 refills | Status: DC

## 2019-02-20 MED ORDER — DARUNAVIR ETHANOLATE 800 MG PO TABS: 800.0000 mg | ORAL_TABLET | Freq: Every day | ORAL | 11 refills | Status: DC

## 2019-02-20 NOTE — Progress Notes (Signed)
   Subjective:    Patient ID: Shawn Brooks, male    DOB: 08-Jun-1976, 43 y.o.   MRN: [id number]  HPI Here for follow up of HIV He continues on Genvoya and prezista with a CD4 of 802 and viral load < 20.  No new issues.  Recently treated for syphilis with titer though just 1:1 but was new.  Also treated for GC and chlamydia.  No current issues with penile discharge.  No complaints.  Still with frequent urination but recent PSA wnl.  Sugar has been good.    Review of Systems  Constitutional: Negative for fatigue and fever.  Gastrointestinal: Negative for diarrhea.  Skin: Negative for rash.       Objective:   Physical Exam Constitutional:      Appearance: Normal appearance.  Eyes:     General: No scleral icterus. Cardiovascular:     Rate and Rhythm: Normal rate and regular rhythm.  Pulmonary:     Effort: Pulmonary effort is normal.  Skin:    Findings: No rash.  Neurological:     Mental Status: He is alert.  Psychiatric:        Mood and Affect: Mood normal.   SH: + tobacco        Assessment & Plan:

## 2019-02-20 NOTE — Assessment & Plan Note (Signed)
Doing well, no issues and rtc 6 months.

## 2019-02-20 NOTE — Addendum Note (Signed)
Addended by: Thayer Headings on: 02/20/2019 01:38 PM   Modules accepted: Orders

## 2019-02-20 NOTE — Addendum Note (Signed)
Addended by: Reggy Eye on: 02/20/2019 01:56 PM   Modules accepted: Orders

## 2019-02-20 NOTE — Assessment & Plan Note (Signed)
Continues with suppressive therapy

## 2019-02-20 NOTE — Assessment & Plan Note (Signed)
Reviewed labs with him and no obvious etiology.  No concerns though found.  No hematuria.

## 2019-02-20 NOTE — Assessment & Plan Note (Signed)
Discussed cessation and he remains precontemplative

## 2019-02-20 NOTE — Assessment & Plan Note (Signed)
Discussed menveo and #2 given today

## 2019-02-21 ENCOUNTER — Other Ambulatory Visit: Payer: Self-pay | Admitting: Internal Medicine

## 2019-02-21 DIAGNOSIS — B20 Human immunodeficiency virus [HIV] disease: Secondary | ICD-10-CM

## 2019-02-22 ENCOUNTER — Ambulatory Visit: Payer: Self-pay

## 2019-03-09 ENCOUNTER — Ambulatory Visit: Payer: Self-pay | Attending: Internal Medicine

## 2019-03-09 ENCOUNTER — Other Ambulatory Visit: Payer: Self-pay

## 2019-04-05 ENCOUNTER — Encounter: Payer: Self-pay | Admitting: Internal Medicine

## 2019-04-05 ENCOUNTER — Other Ambulatory Visit: Payer: Self-pay

## 2019-04-05 ENCOUNTER — Ambulatory Visit: Payer: Self-pay | Attending: Internal Medicine | Admitting: Internal Medicine

## 2019-04-05 DIAGNOSIS — R3589 Other polyuria: Secondary | ICD-10-CM

## 2019-04-05 DIAGNOSIS — R300 Dysuria: Secondary | ICD-10-CM

## 2019-04-05 DIAGNOSIS — R358 Other polyuria: Secondary | ICD-10-CM

## 2019-04-05 NOTE — Progress Notes (Signed)
Patient verified DOB Patient has eaten today. Patient has not taken medication today. Patient denies pain at this time.

## 2019-04-05 NOTE — Progress Notes (Addendum)
Virtual Visit via Telephone Note Due to current restrictions/limitations of in-office visits due to the COVID-19 pandemic, this scheduled clinical appointment was converted to a telehealth visit  I connected with Shawn Brooks on 04/05/19 at 12:06 p.m by telephone and verified that I am speaking with the correct person using two identifiers. I am in my office.  The patient is in his car.  Only the patient and myself participated in this encounter.  I discussed the limitations, risks, security and privacy concerns of performing an evaluation and management service by telephone and the availability of in person appointments. I also discussed with the patient that there may be a patient responsible charge related to this service. The patient expressed understanding and agreed to proceed.   History of Present Illness: HIV, DJD, HTN, HL, anxiety, tob, vit D def, numbness in hands (neg EMG 2019), chronic BL LBP, rectal fissure  I called patient at 10:30 AM for his telephone visit.  Patient tell me that he was unable to talk at that time as he had a plumber present at the house.  I told him that I would call him back.  I called him back shortly after 12 noon.  Patient states that he was driving at the time and not able to talk for long.  He did tell me however that he has been having some discomfort and frequent  urination on and off.  He had seen his infectious disease specialist Dr. Linus Salmons and he has referred him to a urologist..  He states that he has called [facility] but they have not called him back as yet to give him an appointment.   Outpatient Encounter Medications as of 04/05/2019  Medication Sig  . albuterol (PROVENTIL HFA;VENTOLIN HFA) 108 (90 Base) MCG/ACT inhaler Inhale 2 puffs into the lungs every 6 (six) hours as needed for wheezing or shortness of breath.  . benzonatate (TESSALON) 100 MG capsule Take 1 capsule (100 mg total) by mouth every 8 (eight) hours.  Marland Kitchen buPROPion (WELLBUTRIN  SR) 150 MG 12 hr tablet TAKE 1 TABLET(150 MG) BY MOUTH TWICE DAILY  . cyclobenzaprine (FLEXERIL) 10 MG tablet Take 1 tablet (10 mg total) by mouth 2 (two) times daily as needed for muscle spasms.  . darunavir (PREZISTA) 800 MG tablet Take 1 tablet (800 mg total) by mouth daily.  Marland Kitchen elvitegravir-cobicistat-emtricitabine-tenofovir (GENVOYA) 150-150-200-10 MG TABS tablet Take one tablet daily with Prezista  . pravastatin (PRAVACHOL) 40 MG tablet TAKE 1 TABLET BY MOUTH EVERY DAY  . sildenafil (VIAGRA) 50 MG tablet 1 tab po 1/2 hr prior to intercourse. Limit use to 1 tab/24 hrs  . valACYclovir (VALTREX) 500 MG tablet TAKE 1 TABLET BY MOUTH TWICE DAILY  . valACYclovir (VALTREX) 500 MG tablet TAKE 1 TABLET BY MOUTH TWICE DAILY  . diclofenac sodium (VOLTAREN) 1 % GEL Apply 2 g topically 2 (two) times daily as needed. (Patient not taking: Reported on 04/05/2019)  . diltiazem 2 % GEL Insert intra-anally twice daily to the 1st finger joint x 6-8 weeks. (Patient not taking: Reported on 04/05/2019)  . gabapentin (NEURONTIN) 300 MG capsule Take 1 capsule (300 mg total) by mouth 3 (three) times daily. (Patient not taking: Reported on 04/05/2019)  . ondansetron (ZOFRAN ODT) 8 MG disintegrating tablet Take 1 tablet (8 mg total) by mouth every 8 (eight) hours as needed for nausea or vomiting. (Patient not taking: Reported on 04/05/2019)  . pantoprazole (PROTONIX) 40 MG tablet Take 1 tablet by mouth 30 minutes before  breakfast (Patient not taking: Reported on 04/05/2019)  . ranitidine (ZANTAC) 300 MG tablet Take 1 tablet (300 mg total) by mouth every 12 (twelve) hours. (Patient not taking: Reported on 04/05/2019)  . traZODone (DESYREL) 100 MG tablet Take 50 mg by mouth at bedtime.   . varenicline (CHANTIX CONTINUING MONTH PAK) 1 MG tablet Take 1 tablet (1 mg total) by mouth 2 (two) times daily. (Patient not taking: Reported on 04/05/2019)  . varenicline (CHANTIX STARTING MONTH PAK) 0.5 MG X 11 & 1 MG X 42 tablet one 0.5 mg tab  PO once daily for 3 days, then one 0.5 mg tablet twice daily for 4 days, then one 1 mg tablet twice daily. (Patient not taking: Reported on 04/05/2019)  . [DISCONTINUED] predniSONE (DELTASONE) 10 MG tablet 3 tabs PO x 1 day then 2 tabs PO x 2 days then 1 tab PO x 2 days   No facility-administered encounter medications on file as of 04/05/2019.     Observations/Objective: No direct observation done.  Assessment and Plan: 1. Dysuria 2. Polyuria I told patient that I will have our referral person check into the urology referral for him.  Otherwise we will reschedule his visit as patient had to go.  I have sent an inbox message to our referral coordinator  Addendum:  Changed to no charge visit Follow Up Instructions: 4-6 wks   I discussed the assessment and treatment plan with the patient. The patient was provided an opportunity to ask questions and all were answered. The patient agreed with the plan and demonstrated an understanding of the instructions.   The patient was advised to call back or seek an in-person evaluation if the symptoms worsen or if the condition fails to improve as anticipated.  I provided 4 minutes of non-face-to-face time during this encounter.   Karle Plumber, MD

## 2019-04-05 NOTE — Addendum Note (Signed)
Addended by: Karle Plumber B on: 04/05/2019 05:47 PM   Modules accepted: Level of Service

## 2019-04-06 ENCOUNTER — Telehealth: Payer: Self-pay | Admitting: Internal Medicine

## 2019-04-06 NOTE — Addendum Note (Signed)
Addended by: Karle Plumber B on: 04/06/2019 12:47 PM   Modules accepted: Level of Service

## 2019-04-06 NOTE — Telephone Encounter (Signed)
-----   Message from Ena Dawley sent at 04/06/2019 12:28 PM EDT ----- Shawn Brooks Morning  Dr  Wynetta Emery  I spoke to  [facility]  they can schedule an appointment to patient until he got the [facility] Approved .  ([facility] ).  You can place a new referral and I can sent ith to [facility] (orange card so thay can send the notes to [facility] for review  and see if he can be ssen there .   ----- Message ----- From: Ladell Pier, MD Sent: 04/06/2019   7:56 AM EDT To: Ena Dawley  End of the referral was sent by infectious disease.  However I spoke with the patient yesterday in a telephone visit.  He states that they have not called him as yet.  I was just trying to see if you can facilitate him getting the appointment. ----- Message ----- From: Ena Dawley Sent: 04/05/2019   5:52 PM EDT To: Ladell Pier, MD  Good Afternoon  The referral was sent by [facility] is a note in the referral    03/26/2019 11:42 AM [name], [place] patient to see if financial has been approved he advised he is waiting on his letter of approval.     ----- Message ----- From: Ladell Pier, MD Sent: 04/05/2019   5:26 PM EDT To: Ena Dawley  This patient was referred to urology by his infectious disease specialist.  He has the orange card.  He has not received an appointment as yet.  Can you please look into this for him?

## 2019-04-09 ENCOUNTER — Other Ambulatory Visit: Payer: Self-pay | Admitting: Internal Medicine

## 2019-04-09 DIAGNOSIS — E782 Mixed hyperlipidemia: Secondary | ICD-10-CM

## 2019-04-18 ENCOUNTER — Telehealth: Payer: Self-pay

## 2019-04-18 NOTE — Telephone Encounter (Signed)
Received call from [facility]  requesting PSA and Urine Cytology results. Faxed last labs to office as requested. Received confirmation that fax went through. [place] [id number] F: [place], [place]

## 2019-04-30 ENCOUNTER — Encounter: Payer: Self-pay | Admitting: Internal Medicine

## 2019-04-30 NOTE — Telephone Encounter (Deleted)
Referral sent to  [facility] in [place] in [place]

## 2019-04-30 NOTE — Progress Notes (Unsigned)
Since patient has the [facility] the referral was sent to   Sent Referral to [facility] ph. # [phone]. They will contact the patient to schedule an appointment. Pt will go to [place] office cause accept the [facility] ph. # [phone] Address [address] across [facility]

## 2019-05-13 ENCOUNTER — Other Ambulatory Visit: Payer: Self-pay | Admitting: Internal Medicine

## 2019-05-13 DIAGNOSIS — M62830 Muscle spasm of back: Secondary | ICD-10-CM

## 2019-05-14 ENCOUNTER — Ambulatory Visit: Payer: Self-pay

## 2019-05-18 ENCOUNTER — Other Ambulatory Visit: Payer: Self-pay

## 2019-05-18 ENCOUNTER — Ambulatory Visit: Payer: Self-pay

## 2019-06-07 ENCOUNTER — Ambulatory Visit: Payer: Self-pay | Attending: Internal Medicine | Admitting: Internal Medicine

## 2019-06-07 ENCOUNTER — Encounter: Payer: Self-pay | Admitting: Internal Medicine

## 2019-06-07 ENCOUNTER — Other Ambulatory Visit: Payer: Self-pay

## 2019-06-07 DIAGNOSIS — E785 Hyperlipidemia, unspecified: Secondary | ICD-10-CM

## 2019-06-07 DIAGNOSIS — Z2821 Immunization not carried out because of patient refusal: Secondary | ICD-10-CM

## 2019-06-07 DIAGNOSIS — Z21 Asymptomatic human immunodeficiency virus [HIV] infection status: Secondary | ICD-10-CM

## 2019-06-07 DIAGNOSIS — B20 Human immunodeficiency virus [HIV] disease: Secondary | ICD-10-CM

## 2019-06-07 DIAGNOSIS — R200 Anesthesia of skin: Secondary | ICD-10-CM

## 2019-06-07 DIAGNOSIS — F17210 Nicotine dependence, cigarettes, uncomplicated: Secondary | ICD-10-CM

## 2019-06-07 DIAGNOSIS — F17200 Nicotine dependence, unspecified, uncomplicated: Secondary | ICD-10-CM

## 2019-06-07 NOTE — Progress Notes (Signed)
Virtual Visit via Telephone Note Due to current restrictions/limitations of in-office visits due to the COVID-19 pandemic, this scheduled clinical appointment was converted to a telehealth visit  I connected with Shawn Brooks on 06/07/19 at 9 a.m by telephone and verified that I am speaking with the correct person using two identifiers. I am in my office.  The patient is at home.  Only the patient and myself participated in this encounter.  I discussed the limitations, risks, security and privacy concerns of performing an evaluation and management service by telephone and the availability of in person appointments. I also discussed with the patient that there may be a patient responsible charge related to this service. The patient expressed understanding and agreed to proceed.   History of Present Illness: HIV, DJD,  hyperlipidemia, anxiety, tobacco dependence, vit D deficiency, numbness in hands (neg EMG 2019), chronic bilateral lower back pain , rectal fissure, internal hemorrhoids.  Has appt with urology 06/17/2019 for his urinary symptoms that he told me about the last time he spoke..  Tobacco Dependence:  Chantix caused nightmares so he stopped taking it.  Not wanting to give another trial of quitting at this time.    Still has ongoing issues with numbness in his hands but the numbness has become constant in the right index finger.  He had a negative nerve conduction study last year.  I had referred him to neurology last year for an opinion on the numbness but he never got that appointment.    Hyperlipidemia: Taking and tolerating pravastatin  HIV: He follows with ID Dr. Linus Salmons.  Last seen in the summer.  Has follow-up appointment in February.  HM: He declines flu shot. Outpatient Encounter Medications as of 06/07/2019  Medication Sig  . albuterol (PROVENTIL HFA;VENTOLIN HFA) 108 (90 Base) MCG/ACT inhaler Inhale 2 puffs into the lungs every 6 (six) hours as needed for wheezing or  shortness of breath.  . benzonatate (TESSALON) 100 MG capsule Take 1 capsule (100 mg total) by mouth every 8 (eight) hours. (Patient not taking: Reported on 06/07/2019)  . buPROPion (WELLBUTRIN SR) 150 MG 12 hr tablet TAKE 1 TABLET(150 MG) BY MOUTH TWICE DAILY  . cyclobenzaprine (FLEXERIL) 10 MG tablet TAKE 1 TABLET(10 MG) BY MOUTH TWICE DAILY AS NEEDED FOR MUSCLE SPASMS  . darunavir (PREZISTA) 800 MG tablet Take 1 tablet (800 mg total) by mouth daily.  . diclofenac sodium (VOLTAREN) 1 % GEL Apply 2 g topically 2 (two) times daily as needed. (Patient not taking: Reported on 04/05/2019)  . diltiazem 2 % GEL Insert intra-anally twice daily to the 1st finger joint x 6-8 weeks. (Patient not taking: Reported on 04/05/2019)  . elvitegravir-cobicistat-emtricitabine-tenofovir (GENVOYA) 150-150-200-10 MG TABS tablet Take one tablet daily with Prezista  . gabapentin (NEURONTIN) 300 MG capsule Take 1 capsule (300 mg total) by mouth 3 (three) times daily. (Patient not taking: Reported on 04/05/2019)  . ondansetron (ZOFRAN ODT) 8 MG disintegrating tablet Take 1 tablet (8 mg total) by mouth every 8 (eight) hours as needed for nausea or vomiting. (Patient not taking: Reported on 04/05/2019)  . pantoprazole (PROTONIX) 40 MG tablet Take 1 tablet by mouth 30 minutes before breakfast (Patient not taking: Reported on 04/05/2019)  . pravastatin (PRAVACHOL) 40 MG tablet TAKE 1 TABLET BY MOUTH EVERY DAY  . ranitidine (ZANTAC) 300 MG tablet Take 1 tablet (300 mg total) by mouth every 12 (twelve) hours. (Patient not taking: Reported on 04/05/2019)  . sildenafil (VIAGRA) 50 MG tablet 1 tab po  1/2 hr prior to intercourse. Limit use to 1 tab/24 hrs  . traZODone (DESYREL) 100 MG tablet Take 50 mg by mouth at bedtime.   . valACYclovir (VALTREX) 500 MG tablet TAKE 1 TABLET BY MOUTH TWICE DAILY  . valACYclovir (VALTREX) 500 MG tablet TAKE 1 TABLET BY MOUTH TWICE DAILY  . varenicline (CHANTIX CONTINUING MONTH PAK) 1 MG tablet Take 1  tablet (1 mg total) by mouth 2 (two) times daily. (Patient not taking: Reported on 04/05/2019)  . varenicline (CHANTIX STARTING MONTH PAK) 0.5 MG X 11 & 1 MG X 42 tablet one 0.5 mg tab PO once daily for 3 days, then one 0.5 mg tablet twice daily for 4 days, then one 1 mg tablet twice daily. (Patient not taking: Reported on 04/05/2019)   No facility-administered encounter medications on file as of 06/07/2019.      Observations/Objective: No direct observation done as this was a telephone encounter  Assessment and Plan: 1. Numbness in both hands - Ambulatory referral to Neurology - Vitamin B12; Future  2. Tobacco dependence Advised to quit.  Patient not ready to give a trial of quitting at this time.  Less than 5 minutes spent on counseling.  3. Hyperlipidemia, unspecified hyperlipidemia type Continue Pravachol - Lipid panel; Future - CBC; Future - Comprehensive metabolic panel; Future  4. Influenza vaccination declined This was offered.  Patient declined  5. HIV infection, unspecified symptom status ([place]) Followed by infectious disease - CBC; Future   Follow Up Instructions: 5 months   I discussed the assessment and treatment plan with the patient. The patient was provided an opportunity to ask questions and all were answered. The patient agreed with the plan and demonstrated an understanding of the instructions.   The patient was advised to call back or seek an in-person evaluation if the symptoms worsen or if the condition fails to improve as anticipated.  I provided 11 minutes of non-face-to-face time during this encounter.   Karle Plumber, MD

## 2019-06-15 ENCOUNTER — Ambulatory Visit: Payer: Self-pay | Admitting: Physician Assistant

## 2019-06-18 ENCOUNTER — Ambulatory Visit (INDEPENDENT_AMBULATORY_CARE_PROVIDER_SITE_OTHER): Payer: Self-pay | Admitting: Physician Assistant

## 2019-06-18 ENCOUNTER — Other Ambulatory Visit (INDEPENDENT_AMBULATORY_CARE_PROVIDER_SITE_OTHER): Payer: Self-pay

## 2019-06-18 ENCOUNTER — Encounter: Payer: Self-pay | Admitting: Physician Assistant

## 2019-06-18 VITALS — BP 94/54 | HR 68 | Temp 98.1°F | Ht 68.0 in | Wt 223.6 lb

## 2019-06-18 DIAGNOSIS — K625 Hemorrhage of anus and rectum: Secondary | ICD-10-CM

## 2019-06-18 DIAGNOSIS — R109 Unspecified abdominal pain: Secondary | ICD-10-CM

## 2019-06-18 DIAGNOSIS — K5730 Diverticulosis of large intestine without perforation or abscess without bleeding: Secondary | ICD-10-CM

## 2019-06-18 DIAGNOSIS — K219 Gastro-esophageal reflux disease without esophagitis: Secondary | ICD-10-CM

## 2019-06-18 LAB — CBC WITH DIFFERENTIAL/PLATELET
Basophils Absolute: 0 10*3/uL (ref 0.0–0.1)
Basophils Relative: 0.3 % (ref 0.0–3.0)
Eosinophils Absolute: 0.1 10*3/uL (ref 0.0–0.7)
Eosinophils Relative: 1 % (ref 0.0–5.0)
HCT: 47 % (ref 39.0–52.0)
Hemoglobin: 15.9 g/dL (ref 13.0–17.0)
Lymphocytes Relative: 30 % (ref 12.0–46.0)
Lymphs Abs: 2.9 10*3/uL (ref 0.7–4.0)
MCHC: 33.8 g/dL (ref 30.0–36.0)
MCV: 93.8 fl (ref 78.0–100.0)
Monocytes Absolute: 0.7 10*3/uL (ref 0.1–1.0)
Monocytes Relative: 6.8 % (ref 3.0–12.0)
Neutro Abs: 5.9 10*3/uL (ref 1.4–7.7)
Neutrophils Relative %: 61.9 % (ref 43.0–77.0)
Platelets: 197 10*3/uL (ref 150.0–400.0)
RBC: 5.01 Mil/uL (ref 4.22–5.81)
RDW: 13.7 % (ref 11.5–15.5)
WBC: 9.6 10*3/uL (ref 4.0–10.5)

## 2019-06-18 LAB — BASIC METABOLIC PANEL
BUN: 15 mg/dL (ref 6–23)
CO2: 26 mEq/L (ref 19–32)
Calcium: 10.6 mg/dL — ABNORMAL HIGH (ref 8.4–10.5)
Chloride: 103 mEq/L (ref 96–112)
Creatinine, Ser: 1.36 mg/dL (ref 0.40–1.50)
GFR: 68.28 mL/min (ref >60.00)
Glucose, Bld: 85 mg/dL (ref 70–99)
Potassium: 4 mEq/L (ref 3.5–5.1)
Sodium: 137 mEq/L (ref 135–145)

## 2019-06-18 LAB — SEDIMENTATION RATE: Sed Rate: 25 mm/hr — ABNORMAL HIGH (ref 0–15)

## 2019-06-18 MED ORDER — FAMOTIDINE 40 MG PO TABS: 40.0000 mg | ORAL_TABLET | Freq: Every day | ORAL | 11 refills | Status: DC

## 2019-06-18 NOTE — Patient Instructions (Signed)
If you are age 45 or older, your body mass index should be between 23-30. Your Body mass index is 34 kg/m. If this is out of the aforementioned range listed, please consider follow up with your Primary Care Provider.  If you are age 66 or younger, your body mass index should be between 19-25. Your Body mass index is 34 kg/m. If this is out of the aformentioned range listed, please consider follow up with your Primary Care Provider.   Your provider has requested that you go to the basement level for lab work before leaving today. Press "B" on the elevator. The lab is located at the first door on the left as you exit the elevator.  You have been scheduled for a CT scan of the abdomen and pelvis at [facility] ([address]---this is in the same building as [employer]).   You are scheduled on 06-19-2019 at 2:15pm. You should arrive 15 minutes prior to your appointment time for registration. Please follow the written instructions below on the day of your exam:  WARNING: IF YOU ARE ALLERGIC TO IODINE/X-RAY DYE, PLEASE NOTIFY RADIOLOGY IMMEDIATELY AT [phone]! YOU WILL BE GIVEN A 13 HOUR PREMEDICATION PREP.  1) Do not eat or drink anything after 10am (4 hours prior to your test) 2) You have been given 2 bottles of oral contrast to drink. The solution may taste better if refrigerated, but do NOT add ice or any other liquid to this solution. Shake well before drinking.    Drink 1 bottle of contrast @ 12:15/noon (2 hours prior to your exam)  Drink 1 bottle of contrast @ 1:15pm  (1 hour prior to your exam)  You may take any medications as prescribed with a small amount of water, if necessary. If you take any of the following medications: METFORMIN, GLUCOPHAGE, GLUCOVANCE, AVANDAMET, RIOMET, FORTAMET, [place] MET, JANUMET, GLUMETZA or METAGLIP, you MAY be asked to HOLD this medication 48 hours AFTER the exam.  The purpose of you drinking the oral contrast is to aid in the  visualization of your intestinal tract. The contrast solution may cause some diarrhea. Depending on your individual set of symptoms, you may also receive an intravenous injection of x-ray contrast/dye. Plan on being at [facility] for 30 minutes or longer, depending on the type of exam you are having performed.  This test typically takes 30-45 minutes to complete.  If you have any questions regarding your exam or if you need to reschedule, you may call the CT department at [phone] between the hours of 8:00 am and 5:00 pm, Monday-Friday.  ________________________________________________________________________

## 2019-06-18 NOTE — Progress Notes (Signed)
Please let pt know labs look good - sed rate mildly elevated , nonspecific - will see what CT shows

## 2019-06-19 ENCOUNTER — Other Ambulatory Visit: Payer: Self-pay

## 2019-06-19 ENCOUNTER — Ambulatory Visit (INDEPENDENT_AMBULATORY_CARE_PROVIDER_SITE_OTHER)
Admission: RE | Admit: 2019-06-19 | Discharge: 2019-06-19 | Disposition: A | Discharge status: Home / Self Care | Payer: Self-pay | Source: Ambulatory Visit | Attending: Physician Assistant | Admitting: Physician Assistant

## 2019-06-19 ENCOUNTER — Inpatient Hospital Stay: Admission: RE | Admit: 2019-06-19 | Payer: Self-pay | Source: Ambulatory Visit

## 2019-06-19 DIAGNOSIS — K625 Hemorrhage of anus and rectum: Secondary | ICD-10-CM

## 2019-06-19 DIAGNOSIS — R109 Unspecified abdominal pain: Secondary | ICD-10-CM

## 2019-06-19 DIAGNOSIS — K5730 Diverticulosis of large intestine without perforation or abscess without bleeding: Secondary | ICD-10-CM

## 2019-06-19 DIAGNOSIS — K219 Gastro-esophageal reflux disease without esophagitis: Secondary | ICD-10-CM

## 2019-06-19 MED ORDER — IOHEXOL 300 MG/ML  SOLN
100.0000 mL | Freq: Once | INTRAMUSCULAR | Status: AC | PRN
  Administered 2019-06-19: 14:00:00 100 mL via INTRAVENOUS

## 2019-06-20 ENCOUNTER — Encounter: Payer: Self-pay | Admitting: Physician Assistant

## 2019-06-20 NOTE — Progress Notes (Signed)
Addendum: Reviewed and agree with assessment and management plan. Blaze Nylund M, MD  

## 2019-06-20 NOTE — Progress Notes (Signed)
Subjective:    Patient ID: Shawn Brooks, male    DOB: January 07, 1976, 43 y.o.   MRN: [id number]  HPI Cordel is a 43 year old African-American male, known to Dr. Hilarie Fredrickson who was last seen in the office in 2019.  At that time he was seen for anal fissure and internal hemorrhoids, and underwent banding sessions for internal hemorrhoids in the fall 2019. He required colonoscopy in October 2019 for more acute bleeding and had 3 polyps removed which were tubular adenomas, and was noted to have a post banding ulcer. Patient has history of HIV on treatment, chronic kidney disease, anxiety/depression, diverticulosis, and chronic GERD. He says he did well for several months after the hemorrhoidal banding sessions, but that over the past 2 to 3 months he has had recurrent episodes of bleeding and mid abdominal pressure/cramping.  He describes an episode last week after eating chicken fingers which was followed by abdominal pain, pressure ,then eventually an urgent bowel movement.  This has been happening fairly frequently.  This was a normal stool but had very dark blood mixed with the bowel movement.  This persisted most of last week.,ith urgent stools mid abdominal discomfort and blood mixed with the bowel movements.  He has not had any associated nausea or vomiting, no fever or chills.  He has not noticed any blood over the past couple of days.  He denies any anorectal discomfort. He had been maintained on pantoprazole 40 mg every morning and Pepcid every afternoon.  He has been out of medication recently but says that famotidine worked very well for him. Review of Systems Pertinent positive and negative review of systems were noted in the above HPI section.  All other review of systems was otherwise negative.  Outpatient Encounter Medications as of 06/18/2019  Medication Sig  . albuterol (PROVENTIL HFA;VENTOLIN HFA) 108 (90 Base) MCG/ACT inhaler Inhale 2 puffs into the lungs every 6 (six) hours as needed for  wheezing or shortness of breath.  . cyclobenzaprine (FLEXERIL) 10 MG tablet TAKE 1 TABLET(10 MG) BY MOUTH TWICE DAILY AS NEEDED FOR MUSCLE SPASMS  . darunavir (PREZISTA) 800 MG tablet Take 1 tablet (800 mg total) by mouth daily.  Marland Kitchen elvitegravir-cobicistat-emtricitabine-tenofovir (GENVOYA) 150-150-200-10 MG TABS tablet Take one tablet daily with Prezista  . pravastatin (PRAVACHOL) 40 MG tablet TAKE 1 TABLET BY MOUTH EVERY DAY  . risperiDONE (RISPERDAL) 2 MG tablet Take 2 mg by mouth every evening.  . sertraline (ZOLOFT) 50 MG tablet Take 50 mg by mouth daily.  . sildenafil (VIAGRA) 50 MG tablet 1 tab po 1/2 hr prior to intercourse. Limit use to 1 tab/24 hrs  . traZODone (DESYREL) 100 MG tablet Take 50 mg by mouth at bedtime.   . valACYclovir (VALTREX) 500 MG tablet TAKE 1 TABLET BY MOUTH TWICE DAILY  . valACYclovir (VALTREX) 500 MG tablet TAKE 1 TABLET BY MOUTH TWICE DAILY  . famotidine (PEPCID) 40 MG tablet Take 1 tablet (40 mg total) by mouth daily.  . [DISCONTINUED] gabapentin (NEURONTIN) 300 MG capsule Take 1 capsule (300 mg total) by mouth 3 (three) times daily. (Patient not taking: Reported on 04/05/2019)  . [DISCONTINUED] pantoprazole (PROTONIX) 40 MG tablet Take 1 tablet by mouth 30 minutes before breakfast (Patient not taking: Reported on 04/05/2019)   No facility-administered encounter medications on file as of 06/18/2019.   Allergies  Allergen Reactions  . Chantix [Varenicline] Other (See Comments)    Bad dreams  . Oxycodone Itching   Patient Active Problem List  Diagnosis Date Noted  . Need for vaccinations against single bacterial disease 02/20/2019  . Urinary frequency 12/26/2018  . Medication monitoring encounter 08/08/2018  . Memory problem 09/01/2017  . Vitamin D deficiency 09/01/2017  . Primary osteoarthritis of left knee 05/02/2017  . Stage 3 chronic kidney disease 05/02/2017  . Prediabetes 12/22/2016  . Tobacco abuse 05/31/2016  . Lumbar transverse process fracture  ([place]) 10/02/2015  . Screening examination for sexually transmitted disease 04/04/2014  . Seizures ([place]) 08/30/2013  . Thoracic or lumbosacral neuritis or radiculitis 01/15/2013  . H/O gastrointestinal disease 01/15/2013  . Lumbar radiculopathy 01/15/2013  . 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 Condyloma acuminatum 11/17/2012  . 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 AGW (anogenital warts) 11/17/2012  . Hemorrhoid 09/27/2012  . BRBPR (bright red blood per rectum) 07/27/2012  . Prostatitis 04/12/2012  . Constipation 03/09/2012  . Callus 08/16/2011  . Ingrown toenail 08/16/2011  . Chronic low back pain 08/16/2011  . Dyslipidemia 09/03/2010  . Herpes simplex virus (HSV) infection 07/06/2010  . Erectile dysfunction 07/06/2010  . ANXIETY DEPRESSION 05/06/2010  . BURSITIS, KNEE 05/06/2010  . Human immunodeficiency virus (HIV) disease ([place]) 04/16/2010   Social History   Socioeconomic History  . Marital status: Divorced    Spouse name: Not on file  . Number of children: 1  . Years of education: Not on file  . Highest education level: Not on file  Occupational History  . Occupation: Disabled  Tobacco Use  . Smoking status: Current Every Day Smoker    Packs/day: 0.50    Types: Cigarettes  . Smokeless tobacco: Never Used  . Tobacco comment: not ready to quit  Substance and Sexual Activity  . Alcohol use: Yes    Alcohol/week: 2.0 standard drinks    Types: 2 Standard drinks or equivalent per week    Comment: beer at times. not often  . Drug use: Yes    Frequency: 7.0 times per week    Types: Marijuana    Comment: occ  . Sexual activity: Not Currently    Partners: Male    Comment: accepted condoms  Other Topics Concern  . Not on file  Social History Narrative  . Not on file   Social Determinants of Health   Financial Resource Strain:   . Difficulty of Paying Living Expenses: Not on file  Food Insecurity:   . Worried About Sales executive in the Last Year: Not on file  . Ran Out of Food in the Last Year: Not on file  Transportation Needs:   . Lack of Transportation (Medical): Not on file  . Lack of Transportation (Non-Medical): Not on file  Physical Activity:   . Days of Exercise per Week: Not on file  . Minutes of Exercise per Session: Not on file  Stress:   . Feeling of Stress : Not on file  Social Connections:   . Frequency of Communication with Friends and Family: Not on file  . Frequency of Social Gatherings with Friends and Family: Not on file  . Attends Religious Services: Not on file  . Active Member of Clubs or Organizations: Not on file  . Attends Archivist Meetings: Not on file  . Marital Status: Not on file  Intimate Partner Violence:   . Fear of Current or Ex-Partner: Not on file  . Emotionally Abused: Not on file  . Physically Abused: Not on file  . Sexually Abused: Not on file  Mr. Hurta family history includes Cancer in his maternal grandmother and mother; Colon cancer in his maternal grandfather; Crohn's disease in his brother and maternal grandmother; Diabetes in his father; Hypertension in his sister; Pneumonia in his father.      Objective:    Vitals:   06/18/19 0836  BP: (!) 94/54  Pulse: 68  Temp: 98.1 F (36.7 C)    Physical Exam Well-developed well-nourished African-American male in no acute distress.   Weight, 233 BMI 34.0  HEENT; nontraumatic normocephalic, EOMI, PE RR LA, sclera anicteric. Oropharynx; not examined/mask/Covid Neck; supple, no JVD Cardiovascular; regular rate and rhythm with S1-S2, no murmur rub or gallop Pulmonary; Clear bilaterally Abdomen; soft, mild rather generalized tenderness more notable in the left upper quadrant left mid quadrant left lower quadrant, nondistended, no palpable mass or hepatosplenomegaly, bowel sounds are active Rectal; not done today Skin; benign exam, no jaundice rash or appreciable lesions Extremities; no clubbing  cyanosis or edema skin warm and dry Neuro/Psych; alert and oriented x4, grossly nonfocal mood and affect appropriate       Assessment & Plan:   #19 43 year old African-American male with HIV, on treatment, 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 Patient underwent hemorrhoidal banding sessions fall 2019. He now presents with 3-monthhistory of mid abdominal pain/pressure postprandially followed by somewhat urgent stools and over the past couple of weeks frequent episodes of blood mixed with bowel movement. His current symptoms do not sound hemorrhoidal and is more concerned that he may have an acute subacute colitis, consider SCAD,  #2 history of adenomatous colon polyps-due for follow-up colonoscopy 2024 #3 GERD  Plan; Refill famotidine 40 mg p.o. every afternoon Check CBC with differential, sed rate, be met, Schedule for CT of the abdomen pelvis with contrast.  If CT negative consider trial of antispasmodic.   Jilda Kress SGenia HaroldPA-C 06/20/2019   Cc: JLadell Pier MD

## 2019-07-05 ENCOUNTER — Encounter: Payer: Self-pay | Admitting: Internal Medicine

## 2019-07-12 ENCOUNTER — Ambulatory Visit: Payer: Self-pay | Admitting: Neurology

## 2019-07-20 ENCOUNTER — Ambulatory Visit: Payer: Self-pay | Admitting: Neurology

## 2019-07-24 ENCOUNTER — Other Ambulatory Visit: Payer: Self-pay | Admitting: Internal Medicine

## 2019-07-24 DIAGNOSIS — E782 Mixed hyperlipidemia: Secondary | ICD-10-CM

## 2019-07-25 NOTE — Telephone Encounter (Signed)
Appointment 07/27/19

## 2019-07-27 ENCOUNTER — Ambulatory Visit: Payer: Self-pay | Attending: Family | Admitting: Family

## 2019-07-27 DIAGNOSIS — R200 Anesthesia of skin: Secondary | ICD-10-CM

## 2019-07-27 DIAGNOSIS — L600 Ingrowing nail: Secondary | ICD-10-CM

## 2019-07-27 DIAGNOSIS — R109 Unspecified abdominal pain: Secondary | ICD-10-CM

## 2019-07-27 DIAGNOSIS — R300 Dysuria: Secondary | ICD-10-CM

## 2019-07-27 NOTE — Progress Notes (Signed)
Virtual Visit via Telephone Note  I connected with Shawn Brooks Dials, on 07/27/2019 at 8:35 AM by telephone due to the COVID-19 pandemic and verified that I am speaking with the correct person using two identifiers.   Consent: I discussed the limitations, risks, security and privacy concerns of performing an evaluation and management service by telephone and the availability of in person appointments. I also discussed with the patient that there may be a patient responsible charge related to this service. The patient expressed understanding and agreed to proceed.  Due to current restrictions/limitations of in-office visits due to the COVID-19 pandemic, this scheduled clinical appointment was converted to a telehealth visit.  I discussed the limitations, risks, security and privacy concerns of performing an evaluation and management service by telephone and the availability of in person appointments. I also discussed with the patient that there may be a patient responsible charge related to this service. The patient expressed understanding and agreed to proceed.   Location of Patient: Home  Location of Provider: Clinic  Persons participating in Telemedicine visit: [place], NP Orlan Leavens, CMA  History of Present Illness:  1. RIGHT SIDE PAIN:  Location: Right side below the ribcage Onset: 2.5 weeks ago Description: Feels like when he broke his ribs some time ago and the healing process of rib fractures. Does not hurt when he lays on right side. When he reaches with his right arm or places pressure on the right arm his right side is painful.  Modifying factors: Nothing has made it better  Pain Rating: Sitting is at a 0/10 but  8/10 with movement  Symptoms Worse with:  When he reaches with his right arm or places pressure on the right arm his right side is painful.  Better with: Nothing makes right side pain better Trauma: Denies any recent trauma  Red  Flags Fecal/urinary incontinence: Bowel incontinence a few weeks ago but has since stopped. Denies urination incontinence. Burning when urinating especially upon awakening. Burns before urination but does not burn while urinating. Does not have buning after morning urination. Groin pain and numbness denies. .  Weakness: 1 month of feeling decreased energy Fever/chills: Denies Night pain: Denies. States able to sleep well. Unexplained weight loss: Denies  No relief with bedrest: Denies. States able to sleep well. Cancer/immunosuppression: Denies IV drug use: Denies PMH of osteoporosis or chronic steroid use: Denies  Chest pain: Intermittently, once or twice per week with sitting, does not radiate to chest or jaw, only lasts for a moment and then goes away. Shortness of breath:  Denies Comments: Last seen by Dr. Wynetta Emery 06/07/2019. During that encounter Flexeril (10 mg by mouth daily) was prescribed. Reports does not take medication daily. States when he takes it it does help. Has been over 1 month since last taken. Middle knot on right side posterior back, does not move, soft, 2 weeks ago noticed it, quarter-size Takes Aleve 2 tablets daily.  2. REFERRALS UPDATE: Reports that he had referrals and appointments for urologist related to burning urination in the morning; neurologist  related to right index finger number radiating to arm; and podiatrist related toe nail infection.   Had to cancel appointments because he did not have health insurance. Currently has health insurance and plans to call soon to make referral appointments .   Last seen in December 2020 by Shane Crutch with Gastroenterology. Had CT of abdomen and pelvis during that time which resulted short-segment small bowel intussusception in upper abdomen  without evidence of complication and to follow-up within 3 to 4 weeks for additional evaluation. Reports since then he still has blood in stool at least once per week and plans to call  today for follow-up appointment.   Past Medical History:  Diagnosis Date  . Anal condyloma   . Anxiety   . Blood dyscrasia    HIV  . Chronic back pain   . Depression   . DJD (degenerative joint disease)   . Gastric ulcer   . GERD (gastroesophageal reflux disease)   . Headache(784.0)   . Hiatal hernia   . HIV infection ([place])   . Hyperplastic colon polyp   . Hypertension   . Internal hemorrhoids    Allergies  Allergen Reactions  . Chantix [Varenicline] Other (See Comments)    Bad dreams  . Oxycodone Itching    Current Outpatient Medications on File Prior to Visit  Medication Sig Dispense Refill  . albuterol (PROVENTIL HFA;VENTOLIN HFA) 108 (90 Base) MCG/ACT inhaler Inhale 2 puffs into the lungs every 6 (six) hours as needed for wheezing or shortness of breath. 1 Inhaler 0  . cyclobenzaprine (FLEXERIL) 10 MG tablet TAKE 1 TABLET(10 MG) BY MOUTH TWICE DAILY AS NEEDED FOR MUSCLE SPASMS 40 tablet 2  . darunavir (PREZISTA) 800 MG tablet Take 1 tablet (800 mg total) by mouth daily. 30 tablet 11  . elvitegravir-cobicistat-emtricitabine-tenofovir (GENVOYA) 150-150-200-10 MG TABS tablet Take one tablet daily with Prezista 30 tablet 11  . famotidine (PEPCID) 40 MG tablet Take 1 tablet (40 mg total) by mouth daily. 30 tablet 11  . pravastatin (PRAVACHOL) 40 MG tablet TAKE 1 TABLET BY MOUTH EVERY DAY 90 tablet 0  . risperiDONE (RISPERDAL) 2 MG tablet Take 2 mg by mouth every evening.    . sertraline (ZOLOFT) 50 MG tablet Take 50 mg by mouth daily.    . sildenafil (VIAGRA) 50 MG tablet 1 tab po 1/2 hr prior to intercourse. Limit use to 1 tab/24 hrs 30 tablet 3  . traZODone (DESYREL) 100 MG tablet Take 50 mg by mouth at bedtime.   2  . valACYclovir (VALTREX) 500 MG tablet TAKE 1 TABLET BY MOUTH TWICE DAILY 60 tablet 5  . valACYclovir (VALTREX) 500 MG tablet TAKE 1 TABLET BY MOUTH TWICE DAILY 60 tablet 11   No current facility-administered medications on file prior to visit.     Observations/Objective: Alert and oriented x 3. Not in acute distress. Physical examination not completed as this is a telemedicine visit.  Assessment and Plan:   1. ACUTE RIGHT FLANK PAIN:   Continue Aleve 2 tablets daily as needed and Flexeril 10 mg daily as needed for right side pain. Schedule an office visit for follow-up with attending physician in 2 weeks if not better or worse. Patient was given clear instructions to go to Emergency Department or return to medical center if symptoms don't improve, worsen, or new problems develop.The patient verbalized understanding.  2. NUMBNESS IN BOTH HANDS:  -Ambulatory referral to Neurology   3. DYSURIA:  -Ambulatory referral to Urology   4. INGROWN TOENAIL:  -Ambulatory referral to Podiatry  Follow Up Instructions: Follow-up with attending physician in 2 weeks if symptoms not better or worse. Patient was given clear instructions to go to Emergency Department or return to medical center if symptoms don't improve, worsen, or new problems develop.The patient verbalized understanding.  I discussed the assessment and treatment plan with the patient. The patient was provided an opportunity to ask questions and all were  answered. The patient agreed with the plan and demonstrated an understanding of the instructions.   The patient was advised to call back or seek an in-person evaluation if the symptoms worsen or if the condition fails to improve as anticipated.     I provided 31 minutes total of non-face-to-face time during this encounter including median intraservice time, reviewing previous notes, labs, imaging, medications, management and patient verbalized understanding.    Camillia Herter, NP  [facility] [place], [place]   07/27/2019, 8:35 AM

## 2019-08-01 ENCOUNTER — Other Ambulatory Visit: Payer: Self-pay | Admitting: Internal Medicine

## 2019-08-01 DIAGNOSIS — E782 Mixed hyperlipidemia: Secondary | ICD-10-CM

## 2019-08-02 ENCOUNTER — Encounter: Payer: Self-pay | Admitting: Physician Assistant

## 2019-08-02 ENCOUNTER — Ambulatory Visit (INDEPENDENT_AMBULATORY_CARE_PROVIDER_SITE_OTHER): Payer: Self-pay | Admitting: Physician Assistant

## 2019-08-02 VITALS — BP 90/50 | HR 68 | Temp 98.2°F | Ht 69.0 in | Wt 224.1 lb

## 2019-08-02 DIAGNOSIS — R1319 Other dysphagia: Secondary | ICD-10-CM

## 2019-08-02 DIAGNOSIS — Z01818 Encounter for other preprocedural examination: Secondary | ICD-10-CM

## 2019-08-02 DIAGNOSIS — K219 Gastro-esophageal reflux disease without esophagitis: Secondary | ICD-10-CM

## 2019-08-02 DIAGNOSIS — R1010 Upper abdominal pain, unspecified: Secondary | ICD-10-CM

## 2019-08-02 DIAGNOSIS — K561 Intussusception: Secondary | ICD-10-CM

## 2019-08-02 MED ORDER — FAMOTIDINE 40 MG PO TABS: 40.0000 mg | ORAL_TABLET | Freq: Every day | ORAL | 11 refills | Status: DC

## 2019-08-02 NOTE — Patient Instructions (Addendum)
If you are age 46 or older, your body mass index should be between 23-30. Your Body mass index is 33.1 kg/m. If this is out of the aforementioned range listed, please consider follow up with your Primary Care Provider.  If you are age 26 or younger, your body mass index should be between 19-25. Your Body mass index is 33.1 kg/m. If this is out of the aformentioned range listed, please consider follow up with your Primary Care Provider.   You have been scheduled for an endoscopy. Please follow written instructions given to you at your visit today. If you use inhalers (even only as needed), please bring them with you on the day of your procedure.  You are scheduled for a CT enterography at [facility] on 08/10/2019, please arrive at 8:00 am for a 9:30 scan. Please be empty nothing to eat or drink after midnight,  Due to recent changes in healthcare laws, you may see the results of your imaging and laboratory studies on MyChart before your provider has had a chance to review them.  We understand that in some cases there may be results that are confusing or concerning to you. Not all laboratory results come back in the same time frame and the provider may be waiting for multiple results in order to interpret others.  Please give [place] 48 hours in order for your provider to thoroughly review all the results before contacting the office for clarification of your results.

## 2019-08-02 NOTE — Progress Notes (Signed)
Subjective:    Patient ID: Shawn Brooks, male    DOB: June 10, 1976, 44 y.o.   MRN: [id number]  HPI [place] is a pleasant 44 year old African-American male, known to Dr. Hilarie Fredrickson, who was seen by myself about 5 weeks ago and comes in today for follow-up.  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  Appendectomy had undergone hemorrhoidal banding sessions in the fall 2019.  Also with history of GERD. He had come in with complaints of 60-month history of mid abdominal pain/pressure postprandially which had been followed by urgent stools and occasional blood mixed in with his bowel movements.  He was also complaining of increase in GERD symptoms. He was started on famotidine 40 mg p.o. in the evening.  He says his reflux symptoms are much better. Labs were done and unremarkable other than sed rate of 25.  We did CT of the abdomen and pelvis which showed a very short segment of intussusception in the jejunum without obstruction.  This was read as typically idiopathic and self-limited, follow-up CT enterography in 1 month was suggested.  There is no evidence of colitis or diverticulitis. He says that he has continued to have upper abdominal discomfort though not as bad as it had been.  He may notice some bright red blood on the tissue every 3 to 4 days, has not been noting bright red blood mixed in with the bowel movements recently.  He has noticed some right upper quadrant discomfort over the past couple weeks which seems to be aggravated by reaching lifting etc.  Has not had any known injury. He also mentions today that he has been having some intermittent solid food dysphagia.  Has been going on for several months, usually occurs with meat or heavy starchy or foods and can be painful at times.  He has not had any difficulty with liquids, no episodes of regurgitation. Last colonoscopy October 2019  with 3 polyps removed which were tubular adenomas and one was hyperplastic.  He is indicated for 5-year interval follow-up.  Review of Systems Pertinent positive and negative review of systems were noted in the above HPI section.  All other review of systems was otherwise negative.  Outpatient Encounter Medications as of 08/02/2019  Medication Sig  . albuterol (PROVENTIL HFA;VENTOLIN HFA) 108 (90 Base) MCG/ACT inhaler Inhale 2 puffs into the lungs every 6 (six) hours as needed for wheezing or shortness of breath.  . cyclobenzaprine (FLEXERIL) 10 MG tablet TAKE 1 TABLET(10 MG) BY MOUTH TWICE DAILY AS NEEDED FOR MUSCLE SPASMS  . darunavir (PREZISTA) 800 MG tablet Take 1 tablet (800 mg total) by mouth daily.  Marland Kitchen elvitegravir-cobicistat-emtricitabine-tenofovir (GENVOYA) 150-150-200-10 MG TABS tablet Take one tablet daily with Prezista  . famotidine (PEPCID) 40 MG tablet Take 1 tablet (40 mg total) by mouth daily.  . hydrOXYzine (ATARAX/VISTARIL) 10 MG tablet Take 1 tablet 1-3 times daily as needed  . risperiDONE (RISPERDAL) 2 MG tablet Take 2 mg by mouth every evening.  . sertraline (ZOLOFT) 100 MG tablet Take 150 mg by mouth daily.  . sildenafil (VIAGRA) 50 MG tablet 1 tab po 1/2 hr prior to intercourse. Limit use to 1 tab/24 hrs  . traZODone (DESYREL) 100 MG tablet Take 50 mg by mouth at bedtime.   . valACYclovir (VALTREX) 500 MG tablet TAKE 1 TABLET BY MOUTH TWICE DAILY  . [DISCONTINUED] famotidine (PEPCID) 40 MG tablet Take 1  tablet (40 mg total) by mouth daily.  . [DISCONTINUED] pravastatin (PRAVACHOL) 40 MG tablet TAKE 1 TABLET BY MOUTH EVERY DAY  . [DISCONTINUED] sertraline (ZOLOFT) 50 MG tablet Take 50 mg by mouth daily.  . [DISCONTINUED] valACYclovir (VALTREX) 500 MG tablet TAKE 1 TABLET BY MOUTH TWICE DAILY   No facility-administered encounter medications on file as of 08/02/2019.   Allergies  Allergen Reactions  . Chantix [Varenicline] Other (See Comments)    Bad dreams  . Oxycodone  Itching   Patient Active Problem List   Diagnosis Date Noted  . Need for vaccinations against single bacterial disease 02/20/2019  . Urinary frequency 12/26/2018  . Medication monitoring encounter 08/08/2018  . Memory problem 09/01/2017  . Vitamin D deficiency 09/01/2017  . Primary osteoarthritis of left knee 05/02/2017  . Stage 3 chronic kidney disease 05/02/2017  . Prediabetes 12/22/2016  . Tobacco abuse 05/31/2016  . Lumbar transverse process fracture ([place]) 10/02/2015  . Screening examination for sexually transmitted disease 04/04/2014  . Seizures ([place]) 08/30/2013  . Thoracic or lumbosacral neuritis or radiculitis 01/15/2013  . H/O gastrointestinal disease 01/15/2013  . Lumbar radiculopathy 01/15/2013  . 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 Condyloma acuminatum 11/17/2012  . 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 AGW (anogenital warts) 11/17/2012  . Hemorrhoid 09/27/2012  . BRBPR (bright red blood per rectum) 07/27/2012  . Prostatitis 04/12/2012  . Constipation 03/09/2012  . Callus 08/16/2011  . Ingrown toenail 08/16/2011  . Chronic low back pain 08/16/2011  . Dyslipidemia 09/03/2010  . Herpes simplex virus (HSV) infection 07/06/2010  . Erectile dysfunction 07/06/2010  . ANXIETY DEPRESSION 05/06/2010  . BURSITIS, KNEE 05/06/2010  . Human immunodeficiency virus (HIV) disease ([place]) 04/16/2010   Social History   Socioeconomic History  . Marital status: Divorced    Spouse name: Not on file  . Number of children: 1  . Years of education: Not on file  . Highest education level: Not on file  Occupational History  . Occupation: Disabled  Tobacco Use  . Smoking status: Current Every Day Smoker    Packs/day: 0.50    Types: Cigarettes  . Smokeless tobacco: Never Used  . Tobacco comment: not ready to quit  Substance and Sexual Activity  . Alcohol use: Yes    Alcohol/week: 2.0 standard drinks    Types: 2 Standard drinks or equivalent per  week    Comment: beer at times. not often  . Drug use: Yes    Frequency: 7.0 times per week    Types: Marijuana    Comment: occ  . Sexual activity: Not Currently    Partners: Male    Comment: accepted condoms  Other Topics Concern  . Not on file  Social History Narrative  . Not on file   Social Determinants of Health   Financial Resource Strain:   . Difficulty of Paying Living Expenses: Not on file  Food Insecurity:   . Worried About Charity fundraiser in the Last Year: Not on file  . Ran Out of Food in the Last Year: Not on file  Transportation Needs:   . Lack of Transportation (Medical): Not on file  . Lack of Transportation (Non-Medical): Not on file  Physical Activity:   . Days of Exercise per Week: Not on file  . Minutes of Exercise per Session: Not on file  Stress:   . Feeling of Stress : Not on file  Social Connections:   . Frequency of Communication with  Friends and Family: Not on file  . Frequency of Social Gatherings with Friends and Family: Not on file  . Attends Religious Services: Not on file  . Active Member of Clubs or Organizations: Not on file  . Attends Archivist Meetings: Not on file  . Marital Status: Not on file  Intimate Partner Violence:   . Fear of Current or Ex-Partner: Not on file  . Emotionally Abused: Not on file  . Physically Abused: Not on file  . Sexually Abused: Not on file    Mr. Aquilar's family history includes Cancer in his maternal grandmother and mother; Colon cancer in his maternal grandfather; Crohn's disease in his brother and maternal grandmother; Diabetes in his father; Hypertension in his sister; Pneumonia in his father.      Objective:    Vitals:   08/02/19 0836  BP: (!) 90/50  Pulse: 68  Temp: 98.2 F (36.8 C)    Physical Exam Well-developed well-nourished AA male in no acute distress.  Height, [id number], BMI 33.1  HEENT; nontraumatic normocephalic, EOMI, PER R LA, sclera anicteric. Oropharynx; not  examined Neck; supple, no JVD Cardiovascular; regular rate and rhythm with S1-S2, no murmur rub or gallop Pulmonary; Clear bilaterally Abdomen; soft, mildly tender in the epigastrium/hypogastrium and right upper quadrant ,, nondistended, no palpable mass or hepatosplenomegaly, bowel sounds are active Rectal; not done Skin; benign exam, no jaundice rash or appreciable lesions Extremities; no clubbing cyanosis or edema skin warm and dry Neuro/Psych; alert and oriented x4, grossly nonfocal mood and affect appropriate       Assessment & Plan:   #14 44 year old male with GERD, symptoms improved with addition of famotidine 40 mg daily. #2 intermittent solid food dysphagia times several months, rule out peptic stricture, rule out esophageal ring.  #3 epigastric/hypogastric abdominal discomfort increased postprandially Patient is having persistent though less intense symptoms.  CT showed a very short segment of intussusception in the jejunum. Rule out recurrent or persistent intussusception  #4 history of carcinoma in situ of the anus, and adenomatous colon polyps.  Up-to-date with colonoscopy last done October 2019  #5 HIV, on treatment #6 internal hemorrhoids status post banding  Plan; continue famotidine 40 mg p.o. daily AC dinner Patient will be scheduled for upper endoscopy with probable esophageal dilation with Dr. Hilarie Fredrickson.  Procedure was discussed in detail with patient including indications risks benefits and he is agreeable to proceed. Will schedule for CT enterography. Further recommendations pending findings of above.  Plan  Sanita Estrada Genia Harold PA-C 08/02/2019   Cc: Ladell Pier, MD

## 2019-08-07 ENCOUNTER — Encounter: Payer: Self-pay | Admitting: Internal Medicine

## 2019-08-08 ENCOUNTER — Other Ambulatory Visit: Payer: Self-pay | Admitting: *Deleted

## 2019-08-09 ENCOUNTER — Other Ambulatory Visit: Payer: Self-pay

## 2019-08-09 ENCOUNTER — Ambulatory Visit (INDEPENDENT_AMBULATORY_CARE_PROVIDER_SITE_OTHER): Payer: 59 | Admitting: Podiatry

## 2019-08-09 ENCOUNTER — Encounter: Payer: Self-pay | Admitting: Podiatry

## 2019-08-09 ENCOUNTER — Ambulatory Visit (INDEPENDENT_AMBULATORY_CARE_PROVIDER_SITE_OTHER): Payer: 59

## 2019-08-09 VITALS — BP 105/62 | HR 63 | Resp 16

## 2019-08-09 DIAGNOSIS — M898X7 Other specified disorders of bone, ankle and foot: Secondary | ICD-10-CM

## 2019-08-09 DIAGNOSIS — E785 Hyperlipidemia, unspecified: Secondary | ICD-10-CM

## 2019-08-09 DIAGNOSIS — Q828 Other specified congenital malformations of skin: Secondary | ICD-10-CM | POA: Diagnosis not present

## 2019-08-09 DIAGNOSIS — Z113 Encounter for screening for infections with a predominantly sexual mode of transmission: Secondary | ICD-10-CM

## 2019-08-09 DIAGNOSIS — L603 Nail dystrophy: Secondary | ICD-10-CM | POA: Diagnosis not present

## 2019-08-09 DIAGNOSIS — B20 Human immunodeficiency virus [HIV] disease: Secondary | ICD-10-CM

## 2019-08-09 DIAGNOSIS — L600 Ingrowing nail: Secondary | ICD-10-CM

## 2019-08-09 MED ORDER — NEOMYCIN-POLYMYXIN-HC 1 % OT SOLN: OTIC | 1 refills | Status: DC

## 2019-08-09 NOTE — Progress Notes (Signed)
Subjective:  Patient ID: Shawn Brooks, male    DOB: 04-17-1976,  MRN: [id number] HPI Chief Complaint  Patient presents with  . Toe Pain    5th toe left - lateral border, thick, hard nail/skin x 2 years, "digs" it out occasionally  . Nail Problem    Toenails thick and discolored  . New Patient (Initial Visit)    44 y.o. male presents with the above complaint.   ROS: Denies fever chills nausea vomiting muscle aches pains calf pain back pain chest pain shortness of breath.  Past Medical History:  Diagnosis Date  . Anal condyloma   . Anxiety   . Blood dyscrasia    HIV  . Chronic back pain   . Depression   . DJD (degenerative joint disease)   . Gastric ulcer   . GERD (gastroesophageal reflux disease)   . Headache(784.0)   . Hiatal hernia   . HIV infection ([place])   . Hyperplastic colon polyp   . Hypertension   . Internal hemorrhoids    Past Surgical History:  Procedure Laterality Date  . ESOPHAGOGASTRODUODENOSCOPY  03/27/2012   Procedure: ESOPHAGOGASTRODUODENOSCOPY (EGD);  Surgeon: Lear Ng, MD;  Location: [facility];  Service: Endoscopy;  Laterality: N/A;  . IRRIGATION AND DEBRIDEMENT ABSCESS N/A 03/13/2015   Procedure: IRRIGATION AND DEBRIDEMENT ABSCESS;  Surgeon: Johnathan Hausen, MD;  Location: [facility];  Service: General;  Laterality: N/A;  . WISDOM TOOTH EXTRACTION      Current Outpatient Medications:  .  pravastatin (PRAVACHOL) 40 MG tablet, TAKE 1 TABLET BY MOUTH EVERY DAY, Disp: 90 tablet, Rfl: 0 .  albuterol (PROVENTIL HFA;VENTOLIN HFA) 108 (90 Base) MCG/ACT inhaler, Inhale 2 puffs into the lungs every 6 (six) hours as needed for wheezing or shortness of breath., Disp: 1 Inhaler, Rfl: 0 .  cyclobenzaprine (FLEXERIL) 10 MG tablet, TAKE 1 TABLET(10 MG) BY MOUTH TWICE DAILY AS NEEDED FOR MUSCLE SPASMS, Disp: 40 tablet, Rfl: 2 .  darunavir (PREZISTA) 800 MG tablet, Take 1 tablet (800 mg total) by mouth daily., Disp: 30 tablet, Rfl: 11 .   elvitegravir-cobicistat-emtricitabine-tenofovir (GENVOYA) 150-150-200-10 MG TABS tablet, Take one tablet daily with Prezista, Disp: 30 tablet, Rfl: 11 .  famotidine (PEPCID) 40 MG tablet, Take 1 tablet (40 mg total) by mouth daily., Disp: 30 tablet, Rfl: 11 .  HEARTBURN RELIEF Kenny Rea ST 20 MG tablet, Take 20 mg by mouth 2 (two) times daily., Disp: , Rfl:  .  hydrOXYzine (ATARAX/VISTARIL) 10 MG tablet, Take 1 tablet 1-3 times daily as needed, Disp: , Rfl:  .  NEOMYCIN-POLYMYXIN-HYDROCORTISONE (CORTISPORIN) 1 % SOLN OTIC solution, Apply 1-2 drops to toe BID after soaking, Disp: 10 mL, Rfl: 1 .  risperiDONE (RISPERDAL) 2 MG tablet, Take 2 mg by mouth every evening., Disp: , Rfl:  .  sertraline (ZOLOFT) 100 MG tablet, Take 150 mg by mouth daily., Disp: , Rfl:  .  sildenafil (VIAGRA) 50 MG tablet, 1 tab po 1/2 hr prior to intercourse. Limit use to 1 tab/24 hrs, Disp: 30 tablet, Rfl: 3 .  traZODone (DESYREL) 100 MG tablet, Take 50 mg by mouth at bedtime. , Disp: , Rfl: 2 .  valACYclovir (VALTREX) 500 MG tablet, TAKE 1 TABLET BY MOUTH TWICE DAILY, Disp: 60 tablet, Rfl: 5  Allergies  Allergen Reactions  . Chantix [Varenicline] Other (See Comments)    Bad dreams  . Oxycodone Itching   Review of Systems Objective:   Vitals:   08/09/19 1012  BP: 105/62  Pulse: 63  Resp:  16    General: Well developed, nourished, in no acute distress, alert and oriented x3   Dermatological: Skin is warm, dry and supple bilateral. Nails x 10 are well maintained; remaining integument appears unremarkable at this time. There are no open sores, no preulcerative lesions, no rash or signs of infection present.  Ingrown toenail fibular border fifth digit left foot.  Painful on palpation mild erythema no cellulitis drainage or odor.  Vascular: Dorsalis Pedis artery and Posterior Tibial artery pedal pulses are 2/4 bilateral with immedate capillary fill time. Pedal hair growth present. No varicosities and no lower extremity  edema present bilateral.   Neruologic: Grossly intact via light touch bilateral. Vibratory intact via tuning fork bilateral. Protective threshold with Semmes Wienstein monofilament intact to all pedal sites bilateral. Patellar and Achilles deep tendon reflexes 2+ bilateral. No Babinski or clonus noted bilateral.   Musculoskeletal: No gross boney pedal deformities bilateral. No pain, crepitus, or limitation noted with foot and ankle range of motion bilateral. Muscular strength 5/5 in all groups tested bilateral.  Gait: Unassisted, Nonantalgic.    Radiographs:  None taken  Assessment & Plan:   Assessment: Ingrown toenail lateral border fifth digit left foot.  Plan: Partial permanent nail avulsion performed lateral aspect fifth digit left foot.  Tolerated procedure well performed after local anesthetic was administered.  He was given both oral and home-going structure for the care and soaking the toe as well as a prescription for Cortisporin Otic to be applied twice daily after soaking.  I will follow-up with him in 2 weeks.     [place] Vandergriff T. [place], [place]

## 2019-08-09 NOTE — Patient Instructions (Signed)

## 2019-08-09 NOTE — Addendum Note (Signed)
Addended by: Monia Sabal on: 08/09/2019 09:20 AM   Modules accepted: Orders

## 2019-08-10 ENCOUNTER — Ambulatory Visit (HOSPITAL_COMMUNITY)
Admission: RE | Admit: 2019-08-10 | Discharge: 2019-08-10 | Disposition: A | Discharge status: Home / Self Care | Payer: 59 | Source: Ambulatory Visit | Attending: Physician Assistant | Admitting: Physician Assistant

## 2019-08-10 DIAGNOSIS — K561 Intussusception: Secondary | ICD-10-CM | POA: Insufficient documentation

## 2019-08-10 DIAGNOSIS — R1010 Upper abdominal pain, unspecified: Secondary | ICD-10-CM | POA: Insufficient documentation

## 2019-08-10 DIAGNOSIS — R1319 Other dysphagia: Secondary | ICD-10-CM | POA: Diagnosis present

## 2019-08-10 DIAGNOSIS — K219 Gastro-esophageal reflux disease without esophagitis: Secondary | ICD-10-CM | POA: Insufficient documentation

## 2019-08-10 LAB — URINE CYTOLOGY ANCILLARY ONLY
Chlamydia: NEGATIVE
Comment: NEGATIVE
Comment: NORMAL
Neisseria Gonorrhea: NEGATIVE

## 2019-08-10 LAB — T-HELPER CELL (CD4) - (RCID CLINIC ONLY)
CD4 % Helper T Cell: 34 % (ref 33–65)
CD4 T Cell Abs: 1019 /uL (ref 400–1790)

## 2019-08-10 MED ORDER — SODIUM CHLORIDE (PF) 0.9 % IJ SOLN
INTRAMUSCULAR | Status: AC
  Filled 2019-08-10: qty 50

## 2019-08-10 MED ORDER — BARIUM SULFATE 0.1 % PO SUSP
ORAL | Status: AC
  Filled 2019-08-10: qty 3

## 2019-08-10 MED ORDER — IOHEXOL 300 MG/ML  SOLN
100.0000 mL | Freq: Once | INTRAMUSCULAR | Status: AC | PRN
  Administered 2019-08-10: 100 mL via INTRAVENOUS

## 2019-08-13 LAB — CBC WITH DIFFERENTIAL/PLATELET
Absolute Monocytes: 658 cells/uL (ref 200–950)
Basophils Absolute: 39 cells/uL (ref 0–200)
Basophils Relative: 0.3 %
Eosinophils Absolute: 90 cells/uL (ref 15–500)
Eosinophils Relative: 0.7 %
HCT: 43.9 % (ref 38.5–50.0)
Hemoglobin: 15.3 g/dL (ref 13.2–17.1)
Lymphs Abs: 3444 cells/uL (ref 850–3900)
MCH: 31.6 pg (ref 27.0–33.0)
MCHC: 34.9 g/dL (ref 32.0–36.0)
MCV: 90.7 fL (ref 80.0–100.0)
MPV: 11.5 fL (ref 7.5–12.5)
Monocytes Relative: 5.1 %
Neutro Abs: 8669 cells/uL — ABNORMAL HIGH (ref 1500–7800)
Neutrophils Relative %: 67.2 %
Platelets: 200 10*3/uL (ref 140–400)
RBC: 4.84 10*6/uL (ref 4.20–5.80)
RDW: 12.5 % (ref 11.0–15.0)
Total Lymphocyte: 26.7 %
WBC: 12.9 10*3/uL — ABNORMAL HIGH (ref 3.8–10.8)

## 2019-08-13 LAB — LIPID PANEL
Cholesterol: 191 mg/dL (ref <200)
HDL: 41 mg/dL (ref >=40)
Non-HDL Cholesterol (Calc): 150 mg/dL (calc) — ABNORMAL HIGH (ref <130)
Total CHOL/HDL Ratio: 4.7 (calc) (ref <5.0)
Triglycerides: 410 mg/dL — ABNORMAL HIGH (ref <150)

## 2019-08-13 LAB — COMPLETE METABOLIC PANEL WITH GFR
AG Ratio: 1.6 (calc) (ref 1.0–2.5)
ALT: 27 U/L (ref 9–46)
AST: 27 U/L (ref 10–40)
Albumin: 4.1 g/dL (ref 3.6–5.1)
Alkaline phosphatase (APISO): 73 U/L (ref 36–130)
BUN: 11 mg/dL (ref 7–25)
CO2: 27 mmol/L (ref 20–32)
Calcium: 9 mg/dL (ref 8.6–10.3)
Chloride: 104 mmol/L (ref 98–110)
Creat: 1.26 mg/dL (ref 0.60–1.35)
GFR, Est African American: 79 mL/min/{1.73_m2} (ref >=60)
GFR, Est Non African American: 68 mL/min/{1.73_m2} (ref >=60)
Globulin: 2.5 g/dL (calc) (ref 1.9–3.7)
Glucose, Bld: 93 mg/dL (ref 65–99)
Potassium: 4 mmol/L (ref 3.5–5.3)
Sodium: 137 mmol/L (ref 135–146)
Total Bilirubin: 0.3 mg/dL (ref 0.2–1.2)
Total Protein: 6.6 g/dL (ref 6.1–8.1)

## 2019-08-13 LAB — HIV-1 RNA QUANT-NO REFLEX-BLD
HIV 1 RNA Quant: <20 copies/mL — AB
HIV-1 RNA Quant, Log: <1.3 Log copies/mL — AB

## 2019-08-13 LAB — RPR: RPR Ser Ql: NONREACTIVE

## 2019-08-14 ENCOUNTER — Other Ambulatory Visit: Payer: Self-pay | Admitting: Internal Medicine

## 2019-08-14 ENCOUNTER — Ambulatory Visit (INDEPENDENT_AMBULATORY_CARE_PROVIDER_SITE_OTHER): Payer: 59

## 2019-08-14 DIAGNOSIS — Z1159 Encounter for screening for other viral diseases: Secondary | ICD-10-CM

## 2019-08-14 LAB — SARS CORONAVIRUS 2 (TAT 6-24 HRS): SARS Coronavirus 2: NEGATIVE

## 2019-08-15 NOTE — Progress Notes (Signed)
Addendum: Reviewed and agree with assessment and management plan. Threasa Kinch M, MD  

## 2019-08-16 ENCOUNTER — Encounter: Payer: Self-pay | Admitting: Internal Medicine

## 2019-08-18 ENCOUNTER — Other Ambulatory Visit: Payer: Self-pay | Admitting: Internal Medicine

## 2019-08-18 DIAGNOSIS — J209 Acute bronchitis, unspecified: Secondary | ICD-10-CM

## 2019-08-19 MED ORDER — ALBUTEROL SULFATE HFA 108 (90 BASE) MCG/ACT IN AERS: 2.0000 | INHALATION_SPRAY | Freq: Four times a day (QID) | RESPIRATORY_TRACT | 1 refills | Status: DC | PRN

## 2019-08-20 ENCOUNTER — Other Ambulatory Visit: Payer: Self-pay | Admitting: Pharmacist

## 2019-08-20 DIAGNOSIS — J209 Acute bronchitis, unspecified: Secondary | ICD-10-CM

## 2019-08-20 MED ORDER — ALBUTEROL SULFATE HFA 108 (90 BASE) MCG/ACT IN AERS: 2.0000 | INHALATION_SPRAY | Freq: Four times a day (QID) | RESPIRATORY_TRACT | 1 refills | Status: DC | PRN

## 2019-08-20 MED FILL — ALBUTEROL SULFATE HFA 108 (: 108 (90 BAS | 25 days supply | Qty: 18 | Fill #0

## 2019-08-23 ENCOUNTER — Ambulatory Visit (AMBULATORY_SURGERY_CENTER): Payer: 59 | Admitting: Internal Medicine

## 2019-08-23 ENCOUNTER — Encounter: Payer: Self-pay | Admitting: Podiatry

## 2019-08-23 ENCOUNTER — Encounter: Payer: Self-pay | Admitting: Internal Medicine

## 2019-08-23 ENCOUNTER — Other Ambulatory Visit: Payer: Self-pay

## 2019-08-23 ENCOUNTER — Ambulatory Visit (INDEPENDENT_AMBULATORY_CARE_PROVIDER_SITE_OTHER): Payer: 59 | Admitting: Podiatry

## 2019-08-23 VITALS — BP 105/78 | HR 65 | Temp 97.3°F | Resp 14 | Ht 69.0 in | Wt 224.0 lb

## 2019-08-23 DIAGNOSIS — K2950 Unspecified chronic gastritis without bleeding: Secondary | ICD-10-CM

## 2019-08-23 DIAGNOSIS — R1310 Dysphagia, unspecified: Secondary | ICD-10-CM

## 2019-08-23 DIAGNOSIS — K2970 Gastritis, unspecified, without bleeding: Secondary | ICD-10-CM

## 2019-08-23 DIAGNOSIS — L600 Ingrowing nail: Secondary | ICD-10-CM

## 2019-08-23 DIAGNOSIS — K2100 Gastro-esophageal reflux disease with esophagitis, without bleeding: Secondary | ICD-10-CM

## 2019-08-23 DIAGNOSIS — K3189 Other diseases of stomach and duodenum: Secondary | ICD-10-CM

## 2019-08-23 DIAGNOSIS — K219 Gastro-esophageal reflux disease without esophagitis: Secondary | ICD-10-CM

## 2019-08-23 DIAGNOSIS — Z9889 Other specified postprocedural states: Secondary | ICD-10-CM

## 2019-08-23 DIAGNOSIS — R1319 Other dysphagia: Secondary | ICD-10-CM

## 2019-08-23 MED ORDER — FAMOTIDINE 40 MG PO TABS: 40.0000 mg | ORAL_TABLET | Freq: Two times a day (BID) | ORAL | 3 refills | Status: DC

## 2019-08-23 MED ORDER — SODIUM CHLORIDE 0.9 % IV SOLN: 500.0000 mL | Freq: Once | INTRAVENOUS | Status: DC

## 2019-08-23 NOTE — Patient Instructions (Signed)
Handout given on gastritis.   Increase famotidine to 40mg  by mouth twice daily.   YOU HAD AN ENDOSCOPIC PROCEDURE TODAY AT [facility]:   Refer to the procedure report that was given to you for any specific questions about what was found during the examination.  If the procedure report does not answer your questions, please call your gastroenterologist to clarify.  If you requested that your care partner not be given the details of your procedure findings, then the procedure report has been included in a sealed envelope for you to review at your convenience later.  YOU SHOULD EXPECT: Some feelings of bloating in the abdomen. Passage of more gas than usual.  Walking can help get rid of the air that was put into your GI tract during the procedure and reduce the bloating. If you had a lower endoscopy (such as a colonoscopy or flexible sigmoidoscopy) you may notice spotting of blood in your stool or on the toilet paper. If you underwent a bowel prep for your procedure, you may not have a normal bowel movement for a few days.  Please Note:  You might notice some irritation and congestion in your nose or some drainage.  This is from the oxygen used during your procedure.  There is no need for concern and it should clear up in a day or so.  SYMPTOMS TO REPORT IMMEDIATELY:    Following upper endoscopy (EGD)  Vomiting of blood or coffee ground material  New chest pain or pain under the shoulder blades  Painful or persistently difficult swallowing  New shortness of breath  Fever of 100F or higher  Black, tarry-looking stools  For urgent or emergent issues, a gastroenterologist can be reached at any hour by calling [phone].   DIET:  We do recommend a small meal at first, but then you may proceed to your regular diet.  Drink plenty of fluids but you should avoid alcoholic beverages for 24 hours.  ACTIVITY:  You should plan to take it easy for the rest of today and you should  NOT DRIVE or use heavy machinery until tomorrow (because of the sedation medicines used during the test).    FOLLOW UP: Our staff will call the number listed on your records 48-72 hours following your procedure to check on you and address any questions or concerns that you may have regarding the information given to you following your procedure. If we do not reach you, we will leave a message.  We will attempt to reach you two times.  During this call, we will ask if you have developed any symptoms of COVID 19. If you develop any symptoms (ie: fever, flu-like symptoms, shortness of breath, cough etc.) before then, please call [phone].  If you test positive for Covid 19 in the 2 weeks post procedure, please call and report this information to [place].    If any biopsies were taken you will be contacted by phone or by letter within the next 1-3 weeks.  Please call us at [phone] if you have not heard about the biopsies in 3 weeks.    SIGNATURES/CONFIDENTIALITY: You and/or your care partner have signed paperwork which will be entered into your electronic medical record.  These signatures attest to the fact that that the information above on your After Visit Summary has been reviewed and is understood.  Full responsibility of the confidentiality of this discharge information lies with you and/or your care-partner.

## 2019-08-23 NOTE — Progress Notes (Signed)
[name] Vitals DT

## 2019-08-23 NOTE — Progress Notes (Signed)
Called to room to assist during endoscopic procedure.  Patient ID and intended procedure confirmed with present staff. Received instructions for my participation in the procedure from the performing physician.  

## 2019-08-23 NOTE — Progress Notes (Signed)
Pt tolerated well. VSS. Awake and to recovery. 
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
## 2019-08-23 NOTE — Op Note (Signed)
[facility] Patient Name: Shawn Brooks Procedure Date: 08/23/2019 3:46 PM MRN: [id number] Endoscopist: Jerene Bears , MD Age: 44 Referring MD:  Date of Birth: [birth year] Gender: Male Account #: [id number] Procedure:                Upper GI endoscopy Indications:              Dysphagia, Gastro-esophageal reflux disease Medicines:                Monitored Anesthesia Care Procedure:                Pre-Anesthesia Assessment:                           - Prior to the procedure, a History and Physical                            was performed, and patient medications and                            allergies were reviewed. The patient's tolerance of                            previous anesthesia was also reviewed. The risks                            and benefits of the procedure and the sedation                            options and risks were discussed with the patient.                            All questions were answered, and informed consent                            was obtained. Prior Anticoagulants: The patient has                            taken no previous anticoagulant or antiplatelet                            agents. ASA Grade Assessment: II - A patient with                            mild systemic disease. After reviewing the risks                            and benefits, the patient was deemed in                            satisfactory condition to undergo the procedure.                           After obtaining informed consent, the endoscope was  passed under direct vision. Throughout the                            procedure, the patient's blood pressure, pulse, and                            oxygen saturations were monitored continuously. The                            Endoscope was introduced through the mouth, and                            advanced to the second part of duodenum. The upper                            GI endoscopy was  accomplished without difficulty.                            The patient tolerated the procedure well. Scope In: Scope Out: Findings:                 LA Grade A (one or more mucosal breaks less than 5                            mm, not extending between tops of 2 mucosal folds)                            esophagitis with no bleeding was found at the                            gastroesophageal junction.                           No endoscopic abnormality was evident in the                            esophagus to explain the patient's complaint of                            dysphagia. It was decided, however, to proceed with                            dilation of the entire esophagus. The scope was                            withdrawn. Dilation was performed with a Maloney                            dilator with mild resistance at 64 Fr.                           Mild inflammation characterized by erythema was  found in the gastric antrum and in the prepyloric                            region of the stomach. Biopsies were taken with a                            cold forceps for histology and Helicobacter pylori                            testing.                           The examined duodenum was normal. Complications:            No immediate complications. Estimated Blood Loss:     Estimated blood loss was minimal. Impression:               - LA Grade A reflux esophagitis with no bleeding.                            Esophageal dilation to 18 mm with 54 Fr Maloney.                           - Gastritis. Biopsied.                           - Normal examined duodenum. Recommendation:           - Patient has a contact number available for                            emergencies. The signs and symptoms of potential                            delayed complications were discussed with the                            patient. Return to normal activities tomorrow.                             Written discharge instructions were provided to the                            patient.                           - Resume previous diet.                           - Continue present medications.                           - Increase famotidine to 40 mg twice daily (given                            reflux esophagitis while using once daily)>                           -  Await pathology results to exclude H. Pylori as                            source of gastritis. Jerene Bears, MD 08/23/2019 4:21:01 PM This report has been signed electronically.

## 2019-08-24 ENCOUNTER — Telehealth: Payer: Self-pay

## 2019-08-24 NOTE — Telephone Encounter (Signed)
COVID-19 Pre-Screening Questions:08/24/19  Do you currently have a fever (>100 F), chills or unexplained body aches? NO  Are you currently experiencing new cough, shortness of breath, sore throat, runny nose?NO .  Have you recently travelled outside the state of [place] in the last 14 days?NO  .  Have you been in contact with someone that is currently pending confirmation of Covid19 testing or has been confirmed to have the [place] virus? NO  **If the patient answers NO to ALL questions -  advise the patient to please call the clinic before coming to the office should any symptoms develop.

## 2019-08-27 ENCOUNTER — Telehealth: Payer: Self-pay

## 2019-08-27 ENCOUNTER — Encounter: Payer: Self-pay | Admitting: Internal Medicine

## 2019-08-27 NOTE — Telephone Encounter (Signed)
  Follow up Call-  Call back number 08/23/2019 04/19/2018  Post procedure Call Back phone  # [phone]  Permission to leave phone message Yes Yes  Some recent data might be hidden     Patient questions:  Do you have a fever, pain , or abdominal swelling? No. Pain Score  0 *  Have you tolerated food without any problems? Yes.    Have you been able to return to your normal activities? Yes.    Do you have any questions about your discharge instructions: Diet   No. Medications  No. Follow up visit  No.  Do you have questions or concerns about your Care? No.  Actions: * If pain score is 4 or above: 1. No action needed, pain <4.Have you developed a fever since your procedure? no  2.   Have you had an respiratory symptoms (SOB or cough) since your procedure? no  3.   Have you tested positive for COVID 19 since your procedure no  4.   Have you had any family members/close contacts diagnosed with the COVID 19 since your procedure?  no   If yes to any of these questions please route to Joylene John, RN and Alphonsa Gin, Therapist, sports.

## 2019-08-30 ENCOUNTER — Encounter: Payer: Self-pay | Admitting: Internal Medicine

## 2019-09-03 ENCOUNTER — Ambulatory Visit: Payer: Self-pay | Admitting: Neurology

## 2019-09-17 ENCOUNTER — Telehealth: Payer: Self-pay

## 2019-09-17 NOTE — Telephone Encounter (Signed)
COVID-19 Pre-Screening Questions:09/17/19  Do you currently have a fever (>100 F), chills or unexplained body aches?NO  Are you currently experiencing new cough, shortness of breath, sore throat, runny nose? NO  .  Have you recently travelled outside the state of [place] in the last 14 days? NO  .  Have you been in contact with someone that is currently pending confirmation of Covid19 testing or has been confirmed to have the Covid19 virus? NO   **If the patient answers NO to ALL questions -  advise the patient to please call the clinic before coming to the office should any symptoms develop.     

## 2019-09-18 ENCOUNTER — Ambulatory Visit (INDEPENDENT_AMBULATORY_CARE_PROVIDER_SITE_OTHER): Payer: 59 | Admitting: Internal Medicine

## 2019-09-18 ENCOUNTER — Encounter: Payer: Self-pay | Admitting: Internal Medicine

## 2019-09-18 ENCOUNTER — Other Ambulatory Visit: Payer: Self-pay

## 2019-09-18 VITALS — BP 111/69 | HR 67 | Temp 98.3°F | Wt 228.4 lb

## 2019-09-18 DIAGNOSIS — B20 Human immunodeficiency virus [HIV] disease: Secondary | ICD-10-CM | POA: Diagnosis not present

## 2019-09-18 DIAGNOSIS — Z7189 Other specified counseling: Secondary | ICD-10-CM | POA: Diagnosis not present

## 2019-09-18 DIAGNOSIS — Z5181 Encounter for therapeutic drug level monitoring: Secondary | ICD-10-CM | POA: Diagnosis not present

## 2019-09-18 NOTE — Patient Instructions (Signed)
COVID-19 Vaccine Information can be found at: [email or web address] For questions related to vaccine distribution or appointments, please email [email or web address] or call [phone].    

## 2019-09-18 NOTE — Progress Notes (Signed)
   Subjective:    Patient ID: Athena Masse, male    DOB: [birth year], 44 y.o.   MRN: [id number]  HPI Here for follow up of HIV He continues on Genvoya and Prezista denies any missed doses. No new issues.  No associated n/v/d.  Asking about the COVID vaccine. CD4 1019 and viral load < 20   Creat, LFTs wnl   Review of Systems  Constitutional: Negative for fatigue.  Gastrointestinal: Negative for diarrhea and nausea.  Skin: Negative for rash.       Objective:   Physical Exam Constitutional:      Appearance: Normal appearance.  Eyes:     General: No scleral icterus. Pulmonary:     Effort: Pulmonary effort is normal.  Neurological:     General: No focal deficit present.     Mental Status: He is alert.  Psychiatric:        Mood and Affect: Mood normal.   SH: + tobacco        Assessment & Plan:

## 2019-09-18 NOTE — Assessment & Plan Note (Signed)
He is doing well on his salvage regimen with no issues.  Labs all good. rtc 6 months.

## 2019-09-18 NOTE — Assessment & Plan Note (Signed)
Labs good, no concerns.

## 2019-09-18 NOTE — Assessment & Plan Note (Signed)
I discussed the vaccine for him and encouraged him to get it. Info given and he is going to make an appointment

## 2019-09-20 ENCOUNTER — Encounter: Payer: 59 | Admitting: *Deleted

## 2019-09-21 ENCOUNTER — Ambulatory Visit: Payer: 59

## 2019-09-28 ENCOUNTER — Ambulatory Visit: Payer: 59 | Attending: Internal Medicine

## 2019-09-28 DIAGNOSIS — Z23 Encounter for immunization: Secondary | ICD-10-CM

## 2019-09-28 NOTE — Progress Notes (Signed)
   Covid-19 Vaccination Clinic  Name:  Shawn Brooks    MRN: [id number] DOB: 10-24-1975  09/28/2019  [name] was observed post Covid-19 immunization for 15 minutes without incident. He was provided with Vaccine Information Sheet and instruction to access the V-Safe system.   [name] was instructed to call 911 with any severe reactions post vaccine: Marland Kitchen Difficulty breathing  . Swelling of face and throat  . A fast heartbeat  . A bad rash all over body  . Dizziness and weakness   Immunizations Administered    Name Date Dose VIS Date Route   Pfizer COVID-19 Vaccine 09/28/2019  8:14 AM 0.3 mL 06/08/2019 Intramuscular   Manufacturer: [employer]   Lot: [id number]   [place]: [id number]

## 2019-10-04 ENCOUNTER — Encounter: Payer: Self-pay | Admitting: Podiatry

## 2019-10-04 ENCOUNTER — Ambulatory Visit (INDEPENDENT_AMBULATORY_CARE_PROVIDER_SITE_OTHER): Payer: 59 | Admitting: Podiatry

## 2019-10-04 ENCOUNTER — Other Ambulatory Visit: Payer: Self-pay

## 2019-10-04 DIAGNOSIS — L603 Nail dystrophy: Secondary | ICD-10-CM

## 2019-10-04 MED ORDER — FLUCONAZOLE 150 MG PO TABS: 300.0000 mg | ORAL_TABLET | ORAL | 0 refills | Status: DC

## 2019-10-04 NOTE — Progress Notes (Signed)
He presents today for follow-up of his nail avulsion fifth digit left foot.  States that is doing just great the samples that we took last time he was in for his toenails have come back.  Objective: Toe appears to have gone on to heal uneventfully no problems with that.  Pathology results do demonstrate yeast.  Assessment: Onychomycosis with the yeast.  Plan: Regarding go ahead and start him on fluconazole 300 mg once a week this should not affect his kidneys but we will watch him carefully I will follow-up with him in about 3 months for blood work.

## 2019-10-23 ENCOUNTER — Ambulatory Visit: Payer: 59 | Attending: Internal Medicine

## 2019-10-23 DIAGNOSIS — Z23 Encounter for immunization: Secondary | ICD-10-CM

## 2019-10-23 NOTE — Progress Notes (Signed)
   Covid-19 Vaccination Clinic  Name:  SHEP FADNESS    MRN: [id number] DOB: 05-Dec-1975  10/23/2019  Mr. Jaquish was observed post Covid-19 immunization for 15 minutes without incident. He was provided with Vaccine Information Sheet and instruction to access the V-Safe system.   Mr. Niedzwiecki was instructed to call 911 with any severe reactions post vaccine: Marland Kitchen Difficulty breathing  . Swelling of face and throat  . A fast heartbeat  . A bad rash all over body  . Dizziness and weakness   Immunizations Administered    Name Date Dose VIS Date Route   Pfizer COVID-19 Vaccine 10/23/2019  8:35 AM 0.3 mL 08/22/2018 Intramuscular   Manufacturer: [place]   Lot: [id number]   [place]: [id number]

## 2019-11-05 ENCOUNTER — Ambulatory Visit: Payer: Self-pay | Admitting: Internal Medicine

## 2019-12-23 ENCOUNTER — Other Ambulatory Visit: Payer: Self-pay | Admitting: Podiatry

## 2020-01-03 ENCOUNTER — Other Ambulatory Visit: Payer: Self-pay

## 2020-01-03 ENCOUNTER — Ambulatory Visit (INDEPENDENT_AMBULATORY_CARE_PROVIDER_SITE_OTHER): Payer: 59 | Admitting: Podiatry

## 2020-01-03 ENCOUNTER — Encounter: Payer: Self-pay | Admitting: Podiatry

## 2020-01-03 DIAGNOSIS — Z79899 Other long term (current) drug therapy: Secondary | ICD-10-CM | POA: Diagnosis not present

## 2020-01-03 DIAGNOSIS — L603 Nail dystrophy: Secondary | ICD-10-CM | POA: Diagnosis not present

## 2020-01-03 LAB — COMPREHENSIVE METABOLIC PANEL
AG Ratio: 1.5 (calc) (ref 1.0–2.5)
ALT: 25 U/L (ref 9–46)
AST: 26 U/L (ref 10–40)
Albumin: 4.3 g/dL (ref 3.6–5.1)
Alkaline phosphatase (APISO): 85 U/L (ref 36–130)
BUN: 13 mg/dL (ref 7–25)
CO2: 24 mmol/L (ref 20–32)
Calcium: 9.4 mg/dL (ref 8.6–10.3)
Chloride: 104 mmol/L (ref 98–110)
Creat: 1.29 mg/dL (ref 0.60–1.35)
Globulin: 2.9 g/dL (calc) (ref 1.9–3.7)
Glucose, Bld: 89 mg/dL (ref 65–139)
Potassium: 4.1 mmol/L (ref 3.5–5.3)
Sodium: 137 mmol/L (ref 135–146)
Total Bilirubin: 0.4 mg/dL (ref 0.2–1.2)
Total Protein: 7.2 g/dL (ref 6.1–8.1)

## 2020-01-03 MED ORDER — FLUCONAZOLE 150 MG PO TABS: 300.0000 mg | ORAL_TABLET | ORAL | 0 refills | Status: DC

## 2020-01-03 NOTE — Progress Notes (Signed)
He presents today for follow-up of his fungus he is currently taking fluconazole 300 mg once a week every week.  He is tolerating this well denies fever chills nausea vomiting muscle aches pains calf pain back pain chest pain shortness of breath rashes or itching.  He states that his toenails are already starting to look clear lighter and thinner.  Objective: Vital signs are stable alert and oriented x3.  Nails are starting to grow out more clearly.  Assessment: Onychomycosis.  Plan: Long-term therapy with fluconazole 300 mg weekly we are also requesting a complete metabolic panel should this come back abnormal I will notify him.  Otherwise I will follow-up with him in 3 months

## 2020-01-04 ENCOUNTER — Encounter: Payer: Self-pay | Admitting: Internal Medicine

## 2020-01-04 ENCOUNTER — Ambulatory Visit: Payer: 59 | Attending: Internal Medicine | Admitting: Internal Medicine

## 2020-01-04 VITALS — BP 116/83 | HR 71 | Temp 97.5°F | Resp 16 | Wt 231.0 lb

## 2020-01-04 DIAGNOSIS — R300 Dysuria: Secondary | ICD-10-CM | POA: Diagnosis not present

## 2020-01-04 DIAGNOSIS — F17200 Nicotine dependence, unspecified, uncomplicated: Secondary | ICD-10-CM | POA: Diagnosis not present

## 2020-01-04 DIAGNOSIS — E782 Mixed hyperlipidemia: Secondary | ICD-10-CM | POA: Diagnosis not present

## 2020-01-04 MED ORDER — PRAVASTATIN SODIUM 40 MG PO TABS: 40.0000 mg | ORAL_TABLET | Freq: Every day | ORAL | 3 refills | Status: DC

## 2020-01-04 NOTE — Progress Notes (Signed)
Patient ID: Shawn Brooks, male    DOB: 02/25/1976  MRN: [id number]  CC: No chief complaint on file.   Subjective: Shawn Brooks is a 44 y.o. male who presents for chronic ds management.  His concerns today include:  HIV, DJD,  hyperlipidemia, anxiety, tobacco dependence, vit D deficiency, numbness in hands (neg EMG 2019), chronic bilateral lower back pain , rectal fissure, internal hemorrhoids.  Patient was upset due to his wait this morning.  His appointment was scheduled at 9:50 AM.  I entered the room at 10:25 AM.  He states that every time he comes here he has a 30-minute or more wait and wants to know why.  I told him that I do sometimes get behind and apologize for the wait time.  HL:  Ran out in March of pravastatin.  States that the pharmacy had called for refills but did not hear back.  Tob dep: Still smoking.  Not yet ready to quit.      Never saw neurologist about his hand.  Does not want to pursue at this time.    Referred to urologist the beginning of this year the issues that he was having with dysuria and polyuria.  Initially patient states that no one had called him.  However when I looked at the referral note it is made mention that they did call him.  Patient states he did remember getting the call but it was for [facility] in [place] and he wanted to be seen at [facility] urology here in [place].  He is agreeable for me to resubmit the referral. Patient Active Problem List   Diagnosis Date Noted  . Counseled about COVID-19 virus infection 09/18/2019  . Urinary frequency 12/26/2018  . Medication monitoring encounter 08/08/2018  . Memory problem 09/01/2017  . Vitamin D deficiency 09/01/2017  . Primary osteoarthritis of left knee 05/02/2017  . Stage 3 chronic kidney disease 05/02/2017  . Prediabetes 12/22/2016  . Tobacco abuse 05/31/2016  . Lumbar transverse process fracture ([place]) 10/02/2015  . Screening examination for sexually transmitted disease  04/04/2014  . Seizures ([employer]) 08/30/2013  . Thoracic or lumbosacral neuritis or radiculitis 01/15/2013  . H/O gastrointestinal disease 01/15/2013  . Lumbar radiculopathy 01/15/2013  . 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 Condyloma acuminatum 11/17/2012  . 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 AGW (anogenital warts) 11/17/2012  . Hemorrhoid 09/27/2012  . BRBPR (bright red blood per rectum) 07/27/2012  . Prostatitis 04/12/2012  . Constipation 03/09/2012  . Callus 08/16/2011  . Ingrown toenail 08/16/2011  . Chronic low back pain 08/16/2011  . Dyslipidemia 09/03/2010  . Herpes simplex virus (HSV) infection 07/06/2010  . Erectile dysfunction 07/06/2010  . ANXIETY DEPRESSION 05/06/2010  . BURSITIS, KNEE 05/06/2010  . Human immunodeficiency virus (HIV) disease ([place]) 04/16/2010     Current Outpatient Medications on File Prior to Visit  Medication Sig Dispense Refill  . albuterol (VENTOLIN HFA) 108 (90 Base) MCG/ACT inhaler Inhale 2 puffs into the lungs every 6 (six) hours as needed for wheezing or shortness of breath. 18 g 1  . cyclobenzaprine (FLEXERIL) 10 MG tablet TAKE 1 TABLET(10 MG) BY MOUTH TWICE DAILY AS NEEDED FOR MUSCLE SPASMS 40 tablet 2  . darunavir (PREZISTA) 800 MG tablet Take 1 tablet (800 mg total) by mouth daily. 30 tablet 11  . elvitegravir-cobicistat-emtricitabine-tenofovir (GENVOYA) 150-150-200-10 MG TABS tablet Take one tablet daily with Prezista 30 tablet 11  . famotidine (PEPCID) 40 MG tablet Take  40 mg by mouth 2 (two) times daily.    . fluconazole (DIFLUCAN) 150 MG tablet Take 2 tablets (300 mg total) by mouth once a week. 24 tablet 0  . HEARTBURN RELIEF MAX ST 20 MG tablet Take 20 mg by mouth 2 (two) times daily.    . hydrOXYzine (ATARAX/VISTARIL) 10 MG tablet Take 1 tablet 1-3 times daily as needed    . NEOMYCIN-POLYMYXIN-HYDROCORTISONE (CORTISPORIN) 1 % SOLN OTIC solution Apply 1-2 drops to toe BID after soaking 10 mL 1  .  risperiDONE (RISPERDAL) 2 MG tablet Take 2 mg by mouth every evening.    . sertraline (ZOLOFT) 100 MG tablet Take 150 mg by mouth daily.    . sildenafil (VIAGRA) 50 MG tablet 1 tab po 1/2 hr prior to intercourse. Limit use to 1 tab/24 hrs 30 tablet 3  . traZODone (DESYREL) 100 MG tablet Take 50 mg by mouth at bedtime.   2  . valACYclovir (VALTREX) 500 MG tablet TAKE 1 TABLET BY MOUTH TWICE DAILY 60 tablet 5   No current facility-administered medications on file prior to visit.    Allergies  Allergen Reactions  . Chantix [Varenicline] Other (See Comments)    Bad dreams  . Oxycodone Itching    Social History   Socioeconomic History  . Marital status: Divorced    Spouse name: Not on file  . Number of children: 1  . Years of education: Not on file  . Highest education level: Not on file  Occupational History  . Occupation: Disabled  Tobacco Use  . Smoking status: Current Every Day Smoker    Packs/day: 0.50    Types: Cigarettes  . Smokeless tobacco: Never Used  . Tobacco comment: not ready to quit  Vaping Use  . Vaping Use: Never used  Substance and Sexual Activity  . Alcohol use: Yes    Alcohol/week: 2.0 standard drinks    Types: 2 Standard drinks or equivalent per week    Comment: beer at times. not often  . Drug use: Yes    Frequency: 7.0 times per week    Types: Marijuana    Comment: occ  . Sexual activity: Not Currently    Partners: Male    Comment: accepted condoms  Other Topics Concern  . Not on file  Social History Narrative  . Not on file   Social Determinants of Health   Financial Resource Strain:   . Difficulty of Paying Living Expenses:   Food Insecurity:   . Worried About Charity fundraiser in the Last Year:   . Arboriculturist in the Last Year:   Transportation Needs:   . Film/video editor (Medical):   Marland Kitchen Lack of Transportation (Non-Medical):   Physical Activity:   . Days of Exercise per Week:   . Minutes of Exercise per Session:   Stress:    . Feeling of Stress :   Social Connections:   . Frequency of Communication with Friends and Family:   . Frequency of Social Gatherings with Friends and Family:   . Attends Religious Services:   . Active Member of Clubs or Organizations:   . Attends Archivist Meetings:   Marland Kitchen Marital Status:   Intimate Partner Violence:   . Fear of Current or Ex-Partner:   . Emotionally Abused:   Marland Kitchen Physically Abused:   . Sexually Abused:     Family History  Problem Relation Age of Onset  . Cancer Mother  laryngeal  . Pneumonia Father   . Diabetes Father   . Hypertension Sister   . Crohn's disease Brother   . Crohn's disease Maternal Grandmother   . Cancer Maternal Grandmother        patient unsure of type  . Colon cancer Maternal Grandfather     Past Surgical History:  Procedure Laterality Date  . ESOPHAGOGASTRODUODENOSCOPY  03/27/2012   Procedure: ESOPHAGOGASTRODUODENOSCOPY (EGD);  Surgeon: Lear Ng, MD;  Location: [facility];  Service: Endoscopy;  Laterality: N/A;  . IRRIGATION AND DEBRIDEMENT ABSCESS N/A 03/13/2015   Procedure: IRRIGATION AND DEBRIDEMENT ABSCESS;  Surgeon: Johnathan Hausen, MD;  Location: [facility];  Service: General;  Laterality: N/A;  . WISDOM TOOTH EXTRACTION      ROS: Review of Systems Negative except as stated above  PHYSICAL EXAM: BP 116/83   Pulse 71   Temp (!) 97.5 F (36.4 C)   Resp 16   Wt 231 lb (104.8 kg)   SpO2 95%   BMI 34.11 kg/m   Wt Readings from Last 3 Encounters:  01/04/20 231 lb (104.8 kg)  09/18/19 228 lb 6.4 oz (103.6 kg)  08/23/19 224 lb (101.6 kg)    Physical Exam  General appearance - alert, well appearing, and in no distress.  Patient with flat affect.  Poor eye contact.  Not wanting to talk much Chest - clear to auscultation, no wheezes, rales or rhonchi, symmetric air entry Heart - normal rate, regular rhythm, normal S1, S2, no murmurs, rubs, clicks or gallops  Depression screen [facility] 2/9 06/07/2019  04/05/2019 02/20/2019  Decreased Interest 0 0 0  Down, Depressed, Hopeless 0 0 0  PHQ - 2 Score 0 0 0  Altered sleeping - - -  Tired, decreased energy - - -  Change in appetite - - -  Feeling bad or failure about yourself  - - -  Trouble concentrating - - -  Moving slowly or fidgety/restless - - -  Suicidal thoughts - - -  PHQ-9 Score - - -  Difficult doing work/chores - - -  Some recent data might be hidden    CMP Latest Ref Rng & Units 01/03/2020 08/09/2019 06/18/2019  Glucose 65 - 139 mg/dL 89 93 85  BUN 7 - 25 mg/dL 13 11 15   Creatinine 0.60 - 1.35 mg/dL 1.29 1.26 1.36  Sodium 135 - 146 mmol/L 137 137 137  Potassium 3.5 - 5.3 mmol/L 4.1 4.0 4.0  Chloride 98 - 110 mmol/L 104 104 103  CO2 20 - 32 mmol/L 24 27 26   Calcium 8.6 - 10.3 mg/dL 9.4 9.0 10.6(H)  Total Protein 6.1 - 8.1 g/dL 7.2 6.6 -  Total Bilirubin 0.2 - 1.2 mg/dL 0.4 0.3 -  Alkaline Phos 38 - 126 U/L - - -  AST 10 - 40 U/L 26 27 -  ALT 9 - 46 U/L 25 27 -   Lipid Panel     Component Value Date/Time   CHOL 191 08/09/2019 0922   CHOL 189 12/22/2016 1005   TRIG 410 (H) 08/09/2019 0922   HDL 41 08/09/2019 0922   HDL 50 12/22/2016 1005   CHOLHDL 4.7 08/09/2019 0922   VLDL 28 05/12/2016 0955   LDLCALC  08/09/2019 0922     Comment:     . LDL cholesterol not calculated. Triglyceride levels greater than 400 mg/dL invalidate calculated LDL results. . Reference range: <100 . Desirable range <100 mg/dL for primary prevention;   <70 mg/dL for patients with CHD or diabetic  patients  with > or = 2 CHD risk factors. Marland Kitchen LDL-C is now calculated using the Martin-Hopkins  calculation, which is a validated novel method providing  better accuracy than the Friedewald equation in the  estimation of LDL-C.  Cresenciano Genre et al. Annamaria Helling. 6387;564(33): 2061-2068  (http://education.[facility].com/faq/FAQ164)     CBC    Component Value Date/Time   WBC 12.9 (H) 08/09/2019 0922   RBC 4.84 08/09/2019 0922   HGB 15.3  08/09/2019 0922   HGB 15.9 12/22/2016 1005   HCT 43.9 08/09/2019 0922   HCT 46.9 12/22/2016 1005   PLT 200 08/09/2019 0922   PLT 225 12/22/2016 1005   MCV 90.7 08/09/2019 0922   MCV 89 12/22/2016 1005   MCH 31.6 08/09/2019 0922   MCHC 34.9 08/09/2019 0922   RDW 12.5 08/09/2019 0922   RDW 14.2 12/22/2016 1005   LYMPHSABS 3,444 08/09/2019 0922   LYMPHSABS 2.7 12/22/2016 1005   MONOABS 0.7 06/18/2019 0935   EOSABS 90 08/09/2019 0922   EOSABS 0.1 12/22/2016 1005   BASOSABS 39 08/09/2019 0922   BASOSABS 0.0 12/22/2016 1005    ASSESSMENT AND PLAN: 1. Mixed hyperlipidemia  - pravastatin (PRAVACHOL) 40 MG tablet; Take 1 tablet (40 mg total) by mouth daily.  Dispense: 90 tablet; Refill: 3  2. Tobacco dependence Patient not ready to give a trial of quitting  3. Dysuria - Ambulatory referral to Urology   Patient was given the opportunity to ask questions.  Patient verbalized understanding of the plan and was able to repeat key elements of the plan.   Orders Placed This Encounter  Procedures  . Ambulatory referral to Urology     Requested Prescriptions   Signed Prescriptions Disp Refills  . pravastatin (PRAVACHOL) 40 MG tablet 90 tablet 3    Sig: Take 1 tablet (40 mg total) by mouth daily.    Return in about 6 months (around 07/06/2020).  Karle Plumber, MD, FACP

## 2020-01-04 NOTE — Patient Instructions (Signed)
I have resubmitted the referral for you to see [facility] in [place].  They will call you with the appointment.

## 2020-01-18 ENCOUNTER — Telehealth: Payer: Self-pay | Admitting: *Deleted

## 2020-01-18 NOTE — Telephone Encounter (Signed)
Left message informing pt of Dr. Milinda Pointer orders.

## 2020-01-18 NOTE — Telephone Encounter (Signed)
-----   Message from Garrel Ridgel, [place] sent at 01/09/2020  7:42 AM EDT ----- Blood work looks good may continue current medications.

## 2020-02-28 ENCOUNTER — Other Ambulatory Visit: Payer: Self-pay | Admitting: Internal Medicine

## 2020-02-28 ENCOUNTER — Other Ambulatory Visit: Payer: Self-pay | Admitting: *Deleted

## 2020-02-28 DIAGNOSIS — B20 Human immunodeficiency virus [HIV] disease: Secondary | ICD-10-CM

## 2020-02-28 MED ORDER — FAMOTIDINE 40 MG PO TABS: 40.0000 mg | ORAL_TABLET | Freq: Two times a day (BID) | ORAL | 1 refills | Status: DC

## 2020-03-18 ENCOUNTER — Other Ambulatory Visit: Payer: Self-pay | Admitting: Internal Medicine

## 2020-03-18 DIAGNOSIS — B20 Human immunodeficiency virus [HIV] disease: Secondary | ICD-10-CM

## 2020-03-19 ENCOUNTER — Ambulatory Visit: Payer: 59

## 2020-03-19 ENCOUNTER — Other Ambulatory Visit: Payer: Self-pay

## 2020-03-27 ENCOUNTER — Other Ambulatory Visit: Payer: 59

## 2020-03-27 ENCOUNTER — Other Ambulatory Visit: Payer: Self-pay

## 2020-03-27 DIAGNOSIS — B20 Human immunodeficiency virus [HIV] disease: Secondary | ICD-10-CM

## 2020-03-28 LAB — T-HELPER CELL (CD4) - (RCID CLINIC ONLY)
CD4 % Helper T Cell: 32 % — ABNORMAL LOW (ref 33–65)
CD4 T Cell Abs: 1156 /uL (ref 400–1790)

## 2020-03-30 LAB — HIV-1 RNA QUANT-NO REFLEX-BLD
HIV 1 RNA Quant: <20 Copies/mL
HIV-1 RNA Quant, Log: <1.3 Log cps/mL

## 2020-04-08 ENCOUNTER — Ambulatory Visit (INDEPENDENT_AMBULATORY_CARE_PROVIDER_SITE_OTHER): Payer: 59 | Admitting: Podiatry

## 2020-04-08 ENCOUNTER — Encounter: Payer: Self-pay | Admitting: Podiatry

## 2020-04-08 ENCOUNTER — Other Ambulatory Visit: Payer: Self-pay

## 2020-04-08 DIAGNOSIS — L603 Nail dystrophy: Secondary | ICD-10-CM | POA: Diagnosis not present

## 2020-04-08 MED ORDER — FLUCONAZOLE 150 MG PO TABS: 300.0000 mg | ORAL_TABLET | ORAL | 0 refills | Status: DC

## 2020-04-08 NOTE — Progress Notes (Signed)
He presents today for follow-up of his nail yeast infection.  He continues to take fluconazole 300 mg.  He denies any problems with the medicine fever chills nausea vomiting muscle aches pains itching or rashes.  Objective: Nails have nearly grown out 100%.  Skin looks great no signs of fungal yeast infections.  Assessment: Well-healing yeast infection to the nail plates.  Plan: Continue the use of fluconazole once a week for the next 3 months I will follow-up with him at that time consider lab work.

## 2020-04-10 ENCOUNTER — Other Ambulatory Visit: Payer: Self-pay

## 2020-04-10 ENCOUNTER — Ambulatory Visit (INDEPENDENT_AMBULATORY_CARE_PROVIDER_SITE_OTHER): Payer: 59 | Admitting: Internal Medicine

## 2020-04-10 ENCOUNTER — Encounter: Payer: Self-pay | Admitting: Internal Medicine

## 2020-04-10 VITALS — BP 106/74 | HR 68 | Temp 98.3°F | Ht 68.0 in | Wt 228.0 lb

## 2020-04-10 DIAGNOSIS — R61 Generalized hyperhidrosis: Secondary | ICD-10-CM

## 2020-04-10 DIAGNOSIS — Z113 Encounter for screening for infections with a predominantly sexual mode of transmission: Secondary | ICD-10-CM

## 2020-04-10 DIAGNOSIS — B20 Human immunodeficiency virus [HIV] disease: Secondary | ICD-10-CM

## 2020-04-10 DIAGNOSIS — E785 Hyperlipidemia, unspecified: Secondary | ICD-10-CM

## 2020-04-10 NOTE — Assessment & Plan Note (Signed)
No associated concerns with no sob, no weight loss, no joint complaints.  Will check thyroid and A1c next visit.

## 2020-04-10 NOTE — Progress Notes (Signed)
   Subjective:    Patient ID: Shawn Brooks, male    DOB: March 24, 1976, 44 y.o.   MRN: [id number]  HPI Here for follow up of HIV He continues on Genvoya and Prezista denies any missed doses. No new issues.  No associated n/v/d. CD4 1156 and viral load < 20   Had the COVID vaccine.  Refuses flu shot.  Having night sweats that have been intermittent for over a year.     Review of Systems  Constitutional: Negative for fatigue.  Gastrointestinal: Negative for diarrhea and nausea.  Skin: Negative for rash.       Objective:   Physical Exam Constitutional:      Appearance: Normal appearance.  Eyes:     General: No scleral icterus. Pulmonary:     Effort: Pulmonary effort is normal.  Neurological:     General: No focal deficit present.     Mental Status: He is alert.  Psychiatric:        Mood and Affect: Mood normal.   SH: + tobacco        Assessment & Plan:

## 2020-04-10 NOTE — Assessment & Plan Note (Signed)
Doing well and no new issues.  Takes his medicine with no concerns.  rtc 6 months.

## 2020-04-30 ENCOUNTER — Other Ambulatory Visit: Payer: Self-pay

## 2020-04-30 ENCOUNTER — Ambulatory Visit (HOSPITAL_COMMUNITY): Payer: 59 | Admitting: Licensed Clinical Social Worker

## 2020-04-30 ENCOUNTER — Telehealth (HOSPITAL_COMMUNITY): Payer: Self-pay | Admitting: Licensed Clinical Social Worker

## 2020-04-30 ENCOUNTER — Other Ambulatory Visit: Payer: Self-pay | Admitting: Internal Medicine

## 2020-04-30 DIAGNOSIS — E782 Mixed hyperlipidemia: Secondary | ICD-10-CM

## 2020-04-30 NOTE — Telephone Encounter (Signed)
LCSW sent text link for video session per schedule. When pt failed to sign on LCSW phoned pt. He states he was unsure how to access the video session. LCSW provided instruction. Pt does get signed on. LCSW provided education on role and counseling. Pt states he is only interested in medication management. He reports he is well managed on his current meds prescribed through [facility]. He was not engaged in counseling at [place] and again declines counseling services. LCSW invited pt to seek counseling per his desire and needs in the future and provided phone #. He states appreciation. Pt  has med management appointment with Dr. Toy Care tomorrow.

## 2020-05-01 ENCOUNTER — Other Ambulatory Visit: Payer: Self-pay

## 2020-05-01 ENCOUNTER — Telehealth (INDEPENDENT_AMBULATORY_CARE_PROVIDER_SITE_OTHER): Payer: 59 | Admitting: Psychiatry

## 2020-05-01 ENCOUNTER — Encounter (HOSPITAL_COMMUNITY): Payer: Self-pay | Admitting: Psychiatry

## 2020-05-01 DIAGNOSIS — F319 Bipolar disorder, unspecified: Secondary | ICD-10-CM | POA: Diagnosis not present

## 2020-05-01 DIAGNOSIS — B20 Human immunodeficiency virus [HIV] disease: Secondary | ICD-10-CM

## 2020-05-01 MED ORDER — TRAZODONE HCL 100 MG PO TABS: ORAL_TABLET | ORAL | 1 refills | Status: DC

## 2020-05-01 MED ORDER — GENVOYA 150-150-200-10 MG PO TABS: ORAL_TABLET | ORAL | 5 refills | Status: DC

## 2020-05-01 MED ORDER — DARUNAVIR ETHANOLATE 800 MG PO TABS: ORAL_TABLET | ORAL | 5 refills | Status: DC

## 2020-05-01 MED ORDER — RISPERIDONE 3 MG PO TABS: 3.0000 mg | ORAL_TABLET | Freq: Every day | ORAL | 1 refills | Status: DC

## 2020-05-01 MED ORDER — SERTRALINE HCL 100 MG PO TABS: 150.0000 mg | ORAL_TABLET | Freq: Every day | ORAL | 1 refills | Status: DC
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
## 2020-06-17 ENCOUNTER — Encounter: Payer: Self-pay | Admitting: Internal Medicine

## 2020-06-17 ENCOUNTER — Ambulatory Visit: Payer: 59 | Attending: Internal Medicine | Admitting: Internal Medicine

## 2020-06-17 ENCOUNTER — Other Ambulatory Visit: Payer: Self-pay

## 2020-06-17 VITALS — BP 104/69 | HR 76 | Temp 98.4°F | Resp 16 | Ht 68.0 in | Wt 225.0 lb

## 2020-06-17 DIAGNOSIS — E782 Mixed hyperlipidemia: Secondary | ICD-10-CM

## 2020-06-17 DIAGNOSIS — B20 Human immunodeficiency virus [HIV] disease: Secondary | ICD-10-CM

## 2020-06-17 DIAGNOSIS — Z0000 Encounter for general adult medical examination without abnormal findings: Secondary | ICD-10-CM

## 2020-06-17 DIAGNOSIS — F17200 Nicotine dependence, unspecified, uncomplicated: Secondary | ICD-10-CM | POA: Diagnosis not present

## 2020-06-17 DIAGNOSIS — Z8669 Personal history of other diseases of the nervous system and sense organs: Secondary | ICD-10-CM | POA: Insufficient documentation

## 2020-06-17 DIAGNOSIS — E66811 Obesity, class 1: Secondary | ICD-10-CM | POA: Insufficient documentation

## 2020-06-17 DIAGNOSIS — Z2821 Immunization not carried out because of patient refusal: Secondary | ICD-10-CM | POA: Insufficient documentation

## 2020-06-17 DIAGNOSIS — R1319 Other dysphagia: Secondary | ICD-10-CM | POA: Insufficient documentation

## 2020-06-17 DIAGNOSIS — F319 Bipolar disorder, unspecified: Secondary | ICD-10-CM

## 2020-06-17 DIAGNOSIS — E669 Obesity, unspecified: Secondary | ICD-10-CM | POA: Insufficient documentation

## 2020-06-17 NOTE — Progress Notes (Signed)
Patient ID: [name], male    DOB: 1976/02/26  MRN: [id number]  CC: Annual Exam   Subjective: Shawn Brooks is a 44 y.o. male who presents for annual exam His concerns today include:  HIV, DJD,hyperlipidemia, anxiety, bipolar 1  (followed by psychiatry ), tobacco dependence, vit Ddeficiency, numbness in hands (neg EMG 2019), chronicbilateral lower back pain, rectal fissure, internal hemorrhoids.  HM: declines flu. COVID vaccine series complete  Tob dep:  Smoke <1 pk/day.  Smoked for 31 yrs.  Quit for 8 mth in 2001 and again 2 yrs ago for 4-5 mths.  Used the patches in past.  Did not like gum.  Chantix caused nightmares.  Has been thinking about quitting over past 1-2 wks.  More of physical addiction of hand to mouth movement.    Patient Active Problem List   Diagnosis Date Noted  . Influenza vaccine refused 06/17/2020  . Bipolar 1 disorder ([place]) 05/01/2020  . Chronic night sweats 04/10/2020  . Counseled about COVID-19 virus infection 09/18/2019  . Urinary frequency 12/26/2018  . Medication monitoring encounter 08/08/2018  . Memory problem 09/01/2017  . Vitamin D deficiency 09/01/2017  . Primary osteoarthritis of left knee 05/02/2017  . Stage 3 chronic kidney disease ([place]) 05/02/2017  . Prediabetes 12/22/2016  . Tobacco abuse 05/31/2016  . Lumbar transverse process fracture ([place]) 10/02/2015  . Screening examination for sexually transmitted disease 04/04/2014  . Seizures ([place]) 08/30/2013  . Thoracic or lumbosacral neuritis or radiculitis 01/15/2013  . H/O gastrointestinal disease 01/15/2013  . Lumbar radiculopathy 01/15/2013  . 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 Condyloma acuminatum 11/17/2012  . 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 AGW (anogenital warts) 11/17/2012  . Hemorrhoid 09/27/2012  . BRBPR (bright red blood per rectum) 07/27/2012  . Prostatitis 04/12/2012  . Constipation 03/09/2012  . Callus 08/16/2011  . Ingrown  toenail 08/16/2011  . Chronic low back pain 08/16/2011  . Dyslipidemia 09/03/2010  . Herpes simplex virus (HSV) infection 07/06/2010  . Erectile dysfunction 07/06/2010  . ANXIETY DEPRESSION 05/06/2010  . BURSITIS, KNEE 05/06/2010  . Human immunodeficiency virus (HIV) disease ([place]) 04/16/2010     Current Outpatient Medications on File Prior to Visit  Medication Sig Dispense Refill  . albuterol (VENTOLIN HFA) 108 (90 Base) MCG/ACT inhaler Inhale 2 puffs into the lungs every 6 (six) hours as needed for wheezing or shortness of breath. 18 g 1  . cyclobenzaprine (FLEXERIL) 10 MG tablet TAKE 1 TABLET(10 MG) BY MOUTH TWICE DAILY AS NEEDED FOR MUSCLE SPASMS 40 tablet 2  . darunavir (PREZISTA) 800 MG tablet TAKE 1 TABLET(800 MG) BY MOUTH DAILY 30 tablet 5  . elvitegravir-cobicistat-emtricitabine-tenofovir (GENVOYA) 150-150-200-10 MG TABS tablet TAKE 1 TABLET BY MOUTH DAILY WITH PREZISTA 30 tablet 5  . famotidine (PEPCID) 40 MG tablet Take 1 tablet (40 mg total) by mouth 2 (two) times daily. 180 tablet 1  . pravastatin (PRAVACHOL) 40 MG tablet Take 1 tablet (40 mg total) by mouth daily. 90 tablet 3  . risperiDONE (RISPERDAL) 3 MG tablet Take 1 tablet (3 mg total) by mouth at bedtime. 30 tablet 1  . sertraline (ZOLOFT) 100 MG tablet Take 1.5 tablets (150 mg total) by mouth daily. 45 tablet 1  . sildenafil (VIAGRA) 50 MG tablet 1 tab po 1/2 hr prior to intercourse. Limit use to 1 tab/24 hrs 30 tablet 3  . traZODone (DESYREL) 100 MG tablet Take 1 to 2 tablets at bedtime as needed for sleep 60  tablet 1  . valACYclovir (VALTREX) 500 MG tablet TAKE 1 TABLET BY MOUTH TWICE DAILY 60 tablet 5   No current facility-administered medications on file prior to visit.    Allergies  Allergen Reactions  . Chantix [Varenicline] Other (See Comments)    Bad dreams  . Oxycodone Itching    Social History   Socioeconomic History  . Marital status: Divorced    Spouse name: Not on file  . Number of children: 1   . Years of education: Not on file  . Highest education level: Not on file  Occupational History  . Occupation: Disabled  Tobacco Use  . Smoking status: Current Every Day Smoker    Packs/day: 1.00    Types: Cigarettes  . Smokeless tobacco: Never Used  . Tobacco comment: not ready to quit  Vaping Use  . Vaping Use: Never used  Substance and Sexual Activity  . Alcohol use: Yes    Alcohol/week: 2.0 standard drinks    Types: 2 Standard drinks or equivalent per week    Comment: beer at times. not often  . Drug use: Yes    Frequency: 7.0 times per week    Types: Marijuana    Comment: daily  . Sexual activity: Not Currently    Partners: Male  Other Topics Concern  . Not on file  Social History Narrative  . Not on file   Social Determinants of Health   Financial Resource Strain: Not on file  Food Insecurity: Not on file  Transportation Needs: Not on file  Physical Activity: Not on file  Stress: Not on file  Social Connections: Not on file  Intimate Partner Violence: Not on file    Family History  Problem Relation Age of Onset  . Cancer Mother        laryngeal  . Pneumonia Father   . Diabetes Father   . Hypertension Sister   . Crohn's disease Brother   . Crohn's disease Maternal Grandmother   . Cancer Maternal Grandmother        patient unsure of type  . Colon cancer Maternal Grandfather     Past Surgical History:  Procedure Laterality Date  . ESOPHAGOGASTRODUODENOSCOPY  03/27/2012   Procedure: ESOPHAGOGASTRODUODENOSCOPY (EGD);  Surgeon: Lear Ng, MD;  Location: [facility];  Service: Endoscopy;  Laterality: N/A;  . IRRIGATION AND DEBRIDEMENT ABSCESS N/A 03/13/2015   Procedure: IRRIGATION AND DEBRIDEMENT ABSCESS;  Surgeon: Johnathan Hausen, MD;  Location: [facility];  Service: General;  Laterality: N/A;  . WISDOM TOOTH EXTRACTION      ROS: Review of Systems  Constitutional:       Active most of the day doing [employer]. Nothing outside of that. Eats 2  meals a day - does not over eat.  Drinks water and 1 [employer] a day.  Snack on occasional corn chips, apple pies.   HENT: Positive for trouble swallowing (sometimes feels like foods/lq get stuck in chest.  Saw GI last yr and had EGD with stretching of esophagus.  Plans to f/u with GI.  Saw Dr. Raquel James.). Negative for congestion, hearing loss and sore throat.   Eyes:       Last eye exam 1.5 yrs ago at [facility].  Needs new exam. Problems with near vision.  Respiratory: Positive for shortness of breath (sometimes when trying to go to sleep). Negative for cough and chest tightness.   Cardiovascular: Negative for chest pain and leg swelling.  Gastrointestinal: Negative for abdominal pain.  Genitourinary: Negative  for hematuria.       Has appt with urology 06/2020.  Had to reschedule from 2 wks ago.  Follows up regularly with Dr. Linus Salmons for history of HIV.  Reports compliance with medications.  Neurological: Negative for dizziness, light-headedness and headaches.  Psychiatric/Behavioral: Negative for dysphoric mood.       Sees psychiatry for [place]: BP 104/69   Pulse 76   Temp 98.4 F (36.9 C)   Resp 16   Ht 5\' 8"  (1.727 m)   Wt 225 lb (102.1 kg)   SpO2 96%   BMI 34.21 kg/m   Wt Readings from Last 3 Encounters:  06/17/20 225 lb (102.1 kg)  04/10/20 228 lb (103.4 kg)  01/04/20 231 lb (104.8 kg)   Physical Exam  General appearance - alert, well appearing, and in no distress Mental status - normal mood, behavior, speech, dress, motor activity, and thought processes Eyes - pupils equal and reactive, extraocular eye movements intact.  Wearing glasses Ears - bilateral TM's and external ear canals normal Nose - normal and patent, no erythema, discharge or polyps Mouth - mucous membranes moist, pharynx normal without lesions.  Has partials above Neck - supple, no significant adenopathy Lymphatics - no palpable lymphadenopathy, no hepatosplenomegaly Chest - clear to  auscultation, no wheezes, rales or rhonchi, symmetric air entry Heart - normal rate, regular rhythm, normal S1, S2, no murmurs, rubs, clicks or gallops Abdomen - soft, nontender, nondistended, no masses or organomegaly Extremities - peripheral pulses normal, no pedal edema, no clubbing or cyanosis  Depression screen [facility] 2/9 04/10/2020 06/07/2019 04/05/2019  Decreased Interest 0 0 0  Down, Depressed, Hopeless 0 0 0  PHQ - 2 Score 0 0 0  Altered sleeping - - -  Tired, decreased energy - - -  Change in appetite - - -  Feeling bad or failure about yourself  - - -  Trouble concentrating - - -  Moving slowly or fidgety/restless - - -  Suicidal thoughts - - -  PHQ-9 Score - - -  Difficult doing work/chores - - -  Some recent data might be hidden    CMP Latest Ref Rng & Units 01/03/2020 08/09/2019 06/18/2019  Glucose 65 - 139 mg/dL 89 93 85  BUN 7 - 25 mg/dL 13 11 15   Creatinine 0.60 - 1.35 mg/dL 1.29 1.26 1.36  Sodium 135 - 146 mmol/L 137 137 137  Potassium 3.5 - 5.3 mmol/L 4.1 4.0 4.0  Chloride 98 - 110 mmol/L 104 104 103  CO2 20 - 32 mmol/L 24 27 26   Calcium 8.6 - 10.3 mg/dL 9.4 9.0 10.6(H)  Total Protein 6.1 - 8.1 g/dL 7.2 6.6 -  Total Bilirubin 0.2 - 1.2 mg/dL 0.4 0.3 -  Alkaline Phos 38 - 126 U/L - - -  AST 10 - 40 U/L 26 27 -  ALT 9 - 46 U/L 25 27 -   Lipid Panel     Component Value Date/Time   CHOL 191 08/09/2019 0922   CHOL 189 12/22/2016 1005   TRIG 410 (H) 08/09/2019 0922   HDL 41 08/09/2019 0922   HDL 50 12/22/2016 1005   CHOLHDL 4.7 08/09/2019 0922   VLDL 28 05/12/2016 0955   LDLCALC  08/09/2019 0922     Comment:     . LDL cholesterol not calculated. Triglyceride levels greater than 400 mg/dL invalidate calculated LDL results. . Reference range: <100 . Desirable range <100 mg/dL for primary prevention;   <70 mg/dL for  patients with CHD or diabetic patients  with > or = 2 CHD risk factors. Marland Kitchen LDL-C is now calculated using the Martin-Hopkins  calculation,  which is a validated novel method providing  better accuracy than the Friedewald equation in the  estimation of LDL-C.  Cresenciano Genre et al. Annamaria Helling. MU:[id number]): 2061-2068  (http://education.[facility].com/faq/FAQ164)     CBC    Component Value Date/Time   WBC 12.9 (H) 08/09/2019 0922   RBC 4.84 08/09/2019 0922   HGB 15.3 08/09/2019 0922   HGB 15.9 12/22/2016 1005   HCT 43.9 08/09/2019 0922   HCT 46.9 12/22/2016 1005   PLT 200 08/09/2019 0922   PLT 225 12/22/2016 1005   MCV 90.7 08/09/2019 0922   MCV 89 12/22/2016 1005   MCH 31.6 08/09/2019 0922   MCHC 34.9 08/09/2019 0922   RDW 12.5 08/09/2019 0922   RDW 14.2 12/22/2016 1005   LYMPHSABS 3,444 08/09/2019 0922   LYMPHSABS 2.7 12/22/2016 1005   MONOABS 0.7 06/18/2019 0935   EOSABS 90 08/09/2019 0922   EOSABS 0.1 12/22/2016 1005   BASOSABS 39 08/09/2019 0922   BASOSABS 0.0 12/22/2016 1005    ASSESSMENT AND PLAN: 1. Annual physical exam -Patient will schedule an eye exam Discussed new recommendation for colon cancer screening starting at the age of 86.  I recommend that he check with his his insurance to see whether his insurance will cover colon cancer screening at 68 or whether they cover it starting at the age of 69.  Discussed methods of colon cancer screening that are out there.  He will let me know whether his insurance covers for colon cancer screening at the age of 62  2. Tobacco dependence Advised to quit.  Discussed health risks associated with smoking.  He is contemplating quitting.  If he does he will try to quit cold [place].  I encouraged him to set a quit date and work towards quitting on that day.  Less than 5 minutes spent on counseling  3. Obesity (BMI 30.0-34.9) Went over his BMI. Discussed the importance of healthy eating habits and regular exercise to get him within normal weight for height.  Encouraged him to get in at least 150 minutes/week total of some form of moderate intensity exercise. Encourage  smaller portion sizes, decreasing portion size of white carbohydrates, try to eat more lean meat instead of red meat or pork, incorporate fresh fruits and vegetables into the diet, eliminate sugary drinks from the diet. - Hemoglobin A1c - Lipid panel  4. Mixed hyperlipidemia Continue statin therapy.  Check lipid profile today  5. Esophageal dysphagia Patient states that he will call Dr. Tana Felts office himself and arrange a follow-up  6. History of farsightedness Patient plans to call and schedule an eye exam  7. History of HIV infection ([place]) Followed by ID.  Reports compliance with medication  8. Bipolar 1 disorder ([place]) Followed by psychiatry.  9. Influenza vaccine refused Commended.  Patient declined.    Patient was given the opportunity to ask questions.  Patient verbalized understanding of the plan and was able to repeat key elements of the plan.   No orders of the defined types were placed in this encounter.    Requested Prescriptions    No prescriptions requested or ordered in this encounter    No follow-ups on file.  Karle Plumber, MD, FACP

## 2020-06-17 NOTE — Patient Instructions (Signed)
Healthy Eating Following a healthy eating pattern may help you to achieve and maintain a healthy body weight, reduce the risk of chronic disease, and live a long and productive life. It is important to follow a healthy eating pattern at an appropriate calorie level for your body. Your nutritional needs should be met primarily through food by choosing a variety of nutrient-rich foods. What are tips for following this plan? Reading food labels  Read labels and choose the following: ? Reduced or low sodium. ? Juices with 100% fruit juice. ? Foods with low saturated fats and high polyunsaturated and monounsaturated fats. ? Foods with whole grains, such as whole wheat, cracked wheat, brown rice, and wild rice. ? Whole grains that are fortified with folic acid. This is recommended for women who are pregnant or who want to become pregnant.  Read labels and avoid the following: ? Foods with a lot of added sugars. These include foods that contain brown sugar, corn sweetener, corn syrup, dextrose, fructose, glucose, high-fructose corn syrup, honey, invert sugar, lactose, malt syrup, maltose, molasses, raw sugar, sucrose, trehalose, or turbinado sugar.  Do not eat more than the following amounts of added sugar per day:  6 teaspoons (25 g) for women.  9 teaspoons (38 g) for men. ? Foods that contain processed or refined starches and grains. ? Refined grain products, such as white flour, degermed cornmeal, white bread, and white rice. Shopping  Choose nutrient-rich snacks, such as vegetables, whole fruits, and nuts. Avoid high-calorie and high-sugar snacks, such as potato chips, fruit snacks, and candy.  Use oil-based dressings and spreads on foods instead of solid fats such as butter, stick margarine, or cream cheese.  Limit pre-made sauces, mixes, and "instant" products such as flavored rice, instant noodles, and ready-made pasta.  Try more plant-protein sources, such as tofu, tempeh, black beans,  edamame, lentils, nuts, and seeds.  Explore eating plans such as the Mediterranean diet or vegetarian diet. Cooking  Use oil to saut or stir-fry foods instead of solid fats such as butter, stick margarine, or lard.  Try baking, boiling, grilling, or broiling instead of frying.  Remove the fatty part of meats before cooking.  Steam vegetables in water or broth. Meal planning   At meals, imagine dividing your plate into fourths: ? One-half of your plate is fruits and vegetables. ? One-fourth of your plate is whole grains. ? One-fourth of your plate is protein, especially lean meats, poultry, eggs, tofu, beans, or nuts.  Include low-fat dairy as part of your daily diet. Lifestyle  Choose healthy options in all settings, including home, work, school, restaurants, or stores.  Prepare your food safely: ? Wash your hands after handling raw meats. ? Keep food preparation surfaces clean by regularly washing with hot, soapy water. ? Keep raw meats separate from ready-to-eat foods, such as fruits and vegetables. ? Cook seafood, meat, poultry, and eggs to the recommended internal temperature. ? Store foods at safe temperatures. In general:  Keep cold foods at 59F (4.4C) or below.  Keep hot foods at 159F (60C) or above.  Keep your freezer at [facility] (-17.8C) or below.  Foods are no longer safe to eat when they have been between the temperatures of 40-159F (4.4-60C) for more than 2 hours. What foods should I eat? Fruits Aim to eat 2 cup-equivalents of fresh, canned (in natural juice), or frozen fruits each day. Examples of 1 cup-equivalent of fruit include 1 small apple, 8 large strawberries, 1 cup canned fruit,  cup  dried fruit, or 1 cup 100% juice. Vegetables Aim to eat 2-3 cup-equivalents of fresh and frozen vegetables each day, including different varieties and colors. Examples of 1 cup-equivalent of vegetables include 2 medium carrots, 2 cups raw, leafy greens, 1 cup chopped  vegetable (raw or cooked), or 1 medium baked potato. Grains Aim to eat 6 ounce-equivalents of whole grains each day. Examples of 1 ounce-equivalent of grains include 1 slice of bread, 1 cup ready-to-eat cereal, 3 cups popcorn, or  cup cooked rice, pasta, or cereal. Meats and other proteins Aim to eat 5-6 ounce-equivalents of protein each day. Examples of 1 ounce-equivalent of protein include 1 egg, 1/2 cup nuts or seeds, or 1 tablespoon (16 g) peanut butter. A cut of meat or fish that is the size of a deck of cards is about 3-4 ounce-equivalents.  Of the protein you eat each week, try to have at least 8 ounces come from seafood. This includes salmon, trout, herring, and anchovies. Dairy Aim to eat 3 cup-equivalents of fat-free or low-fat dairy each day. Examples of 1 cup-equivalent of dairy include 1 cup (240 mL) milk, 8 ounces (250 g) yogurt, 1 ounces (44 g) natural cheese, or 1 cup (240 mL) fortified soy milk. Fats and oils  Aim for about 5 teaspoons (21 g) per day. Choose monounsaturated fats, such as canola and olive oils, avocados, peanut butter, and most nuts, or polyunsaturated fats, such as sunflower, corn, and soybean oils, walnuts, pine nuts, sesame seeds, sunflower seeds, and flaxseed. Beverages  Aim for six 8-oz glasses of water per day. Limit coffee to three to five 8-oz cups per day.  Limit caffeinated beverages that have added calories, such as soda and energy drinks.  Limit alcohol intake to no more than 1 drink a day for nonpregnant women and 2 drinks a day for men. One drink equals 12 oz of beer (355 mL), 5 oz of wine (148 mL), or 1 oz of hard liquor (44 mL). Seasoning and other foods  Avoid adding excess amounts of salt to your foods. Try flavoring foods with herbs and spices instead of salt.  Avoid adding sugar to foods.  Try using oil-based dressings, sauces, and spreads instead of solid fats. This information is based on general [place] nutrition guidelines. For more  information, visit BuildDNA.es. Exact amounts may vary based on your nutrition needs. Summary  A healthy eating plan may help you to maintain a healthy weight, reduce the risk of chronic diseases, and stay active throughout your life.  Plan your meals. Make sure you eat the right portions of a variety of nutrient-rich foods.  Try baking, boiling, grilling, or broiling instead of frying.  Choose healthy options in all settings, including home, work, school, restaurants, or stores. This information is not intended to replace advice given to you by your health care provider. Make sure you discuss any questions you have with your health care provider. Document Revised: 09/26/2017 Document Reviewed: 09/26/2017 Elsevier Patient Education  [place].   Smoking Tobacco Information, Adult Smoking tobacco can be harmful to your health. Tobacco contains a poisonous (toxic), colorless chemical called nicotine. Nicotine is addictive. It changes the brain and can make it hard to stop smoking. Tobacco also has other toxic chemicals that can hurt your body and raise your risk of many cancers. How can smoking tobacco affect me? Smoking tobacco puts you at risk for:  Cancer. Smoking is most commonly associated with lung cancer, but can also lead to cancer in  other parts of the body.  Chronic obstructive pulmonary disease (COPD). This is a long-term lung condition that makes it hard to breathe. It also gets worse over time.  High blood pressure (hypertension), heart disease, stroke, or heart attack.  Lung infections, such as pneumonia.  Cataracts. This is when the lenses in the eyes become clouded.  Digestive problems. This may include peptic ulcers, heartburn, and gastroesophageal reflux disease (GERD).  Oral health problems, such as gum disease and tooth loss.  Loss of taste and smell. Smoking can affect your appearance by causing:  Wrinkles.  Yellow or stained teeth, fingers,  and fingernails. Smoking tobacco can also affect your social life, because:  It may be challenging to find places to smoke when away from home. Many workplaces, [employer], hotels, and public places are tobacco-free.  Smoking is expensive. This is due to the cost of tobacco and the long-term costs of treating health problems from smoking.  Secondhand smoke may affect those around you. Secondhand smoke can cause lung cancer, breathing problems, and heart disease. Children of smokers have a higher risk for: ? Sudden infant death syndrome (SIDS). ? Ear infections. ? Lung infections. If you currently smoke tobacco, quitting now can help you:  Lead a longer and healthier life.  Look, smell, breathe, and feel better over time.  Save money.  Protect others from the harms of secondhand smoke. What actions can I take to prevent health problems? Quit smoking   Do not start smoking. Quit if you already do.  Make a plan to quit smoking and commit to it. Look for programs to help you and ask your health care provider for recommendations and ideas.  Set a date and write down all the reasons you want to quit.  Let your friends and family know you are quitting so they can help and support you. Consider finding friends who also want to quit. It can be easier to quit with someone else, so that you can support each other.  Talk with your health care provider about using nicotine replacement medicines to help you quit, such as gum, lozenges, patches, sprays, or pills.  Do not replace cigarette smoking with electronic cigarettes, which are commonly called e-cigarettes. The safety of e-cigarettes is not known, and some may contain harmful chemicals.  If you try to quit but return to smoking, stay positive. It is common to slip up when you first quit, so take it one day at a time.  Be prepared for cravings. When you feel the urge to smoke, chew gum or suck on hard candy. Lifestyle  Stay busy and  take care of your body.  Drink enough fluid to keep your urine pale yellow.  Get plenty of exercise and eat a healthy diet. This can help prevent weight gain after quitting.  Monitor your eating habits. Quitting smoking can cause you to have a larger appetite than when you smoke.  Find ways to relax. Go out with friends or family to a movie or a restaurant where people do not smoke.  Ask your health care provider about having regular tests (screenings) to check for cancer. This may include blood tests, imaging tests, and other tests.  Find ways to manage your stress, such as meditation, yoga, or exercise. Where to find support To get support to quit smoking, consider:  Asking your health care provider for more information and resources.  Taking classes to learn more about quitting smoking.  Looking for local organizations that offer resources about  quitting smoking.  Joining a support group for people who want to quit smoking in your local community.  Calling the smokefree.gov counselor helpline: 1-800-Quit-Now 650-803-3747) Where to find more information You may find more information about quitting smoking from:  HelpGuide.org: www.helpguide.org  https://hall.com/: smokefree.gov  American Lung Association: www.lung.org Contact a health care provider if you:  Have problems breathing.  Notice that your lips, nose, or fingers turn blue.  Have chest pain.  Are coughing up blood.  Feel faint or you pass out.  Have other health changes that cause you to worry. Summary  Smoking tobacco can negatively affect your health, the health of those around you, your finances, and your social life.  Do not start smoking. Quit if you already do. If you need help quitting, ask your health care provider.  Think about joining a support group for people who want to quit smoking in your local community. There are many effective programs that will help you to quit this behavior. This  information is not intended to replace advice given to you by your health care provider. Make sure you discuss any questions you have with your health care provider. Document Revised: 03/09/2019 Document Reviewed: 06/29/2016 Elsevier Patient Education  2020 [employer].

## 2020-06-18 ENCOUNTER — Telehealth: Payer: Self-pay

## 2020-06-18 LAB — LIPID PANEL
Chol/HDL Ratio: 4 ratio (ref 0.0–5.0)
Cholesterol, Total: 243 mg/dL — ABNORMAL HIGH (ref 100–199)
HDL: 61 mg/dL (ref >39)
LDL Chol Calc (NIH): 156 mg/dL — ABNORMAL HIGH (ref 0–99)
Triglycerides: 147 mg/dL (ref 0–149)
VLDL Cholesterol Cal: 26 mg/dL (ref 5–40)

## 2020-06-18 LAB — HEMOGLOBIN A1C
Est. average glucose Bld gHb Est-mCnc: 117 mg/dL
Hgb A1c MFr Bld: 5.7 % — ABNORMAL HIGH (ref 4.8–5.6)

## 2020-06-18 MED ORDER — ROSUVASTATIN CALCIUM 20 MG PO TABS: 20.0000 mg | ORAL_TABLET | Freq: Every day | ORAL | 5 refills | Status: DC

## 2020-06-18 NOTE — Telephone Encounter (Signed)
Pravachol changed to Crestor.

## 2020-06-18 NOTE — Progress Notes (Signed)
Let patient know that he has prediabetes.  Healthy eating habits and regular exercise will help prevent progression to full diabetes.  His LDL cholesterol is 156 with goal being less than 100.  Please confirm that he is taking the pravastatin 40 mg daily consistently.  If he has not been taking it consistently, please encourage him to do so.  If he has been taking it consistently over the past few months and his level is what it is at this time, I would recommend that we change to a different cholesterol-lowering medication that is more effective like Lipitor or Crestor.

## 2020-06-18 NOTE — Telephone Encounter (Signed)
Contacted pt to go over lab results pt is aware   Pt states he has been taking medication everyday. Pt states he is okay with the medication change and would like rx sent to [facility] on [address]

## 2020-07-01 ENCOUNTER — Telehealth (HOSPITAL_COMMUNITY): Payer: 59 | Admitting: Psychiatry

## 2020-07-10 ENCOUNTER — Encounter (HOSPITAL_COMMUNITY): Payer: Self-pay | Admitting: Psychiatry

## 2020-07-10 ENCOUNTER — Telehealth (INDEPENDENT_AMBULATORY_CARE_PROVIDER_SITE_OTHER): Payer: 59 | Admitting: Psychiatry

## 2020-07-10 ENCOUNTER — Other Ambulatory Visit: Payer: Self-pay

## 2020-07-10 DIAGNOSIS — F319 Bipolar disorder, unspecified: Secondary | ICD-10-CM

## 2020-07-10 MED ORDER — RISPERIDONE 3 MG PO TABS: 3.0000 mg | ORAL_TABLET | Freq: Every day | ORAL | 1 refills | Status: DC

## 2020-07-10 MED ORDER — TRAZODONE HCL 100 MG PO TABS: ORAL_TABLET | ORAL | 1 refills | Status: DC

## 2020-07-10 MED ORDER — SERTRALINE HCL 100 MG PO TABS: 150.0000 mg | ORAL_TABLET | Freq: Every day | ORAL | 1 refills | Status: DC

## 2020-07-10 NOTE — Progress Notes (Signed)
[facility] Progress Note   Virtual Visit via Video Note  I connected with Shawn Brooks on 07/10/20 at  2:20 PM EST by a video enabled telemedicine application and verified that I am speaking with the correct person using two identifiers.  Location: Patient: Home Provider: Clinic   I discussed the limitations of evaluation and management by telemedicine and the availability of in person appointments. The patient expressed understanding and agreed to proceed.  I provided 15 minutes of non-face-to-face time during this encounter.     Patient Identification: Shawn Brooks MRN:  [id number] Date of Evaluation:  07/10/2020    Chief Complaint:  " I am doing better."   Visit Diagnosis:    ICD-10-CM   1. Bipolar 1 disorder (HCC)  F31.9     History of Present Illness: Patient reported that he found the increase in the dose of risperidone to be helpful.  He stated that his anger outbursts have reduced to some extent.  He stated that he understands the medicine cannot change everything about him but he feels he is on the right track for now. He thinks his current combination of medicines is helpful. He would like to keep the same regimen for now. He still works as a Fish farm manager.  He has a birthday party scheduled for this weekend and he hopes that all of them can have a good time without having to worry about COVID.  Past Psychiatric History: Bipolar d/o  Previous Psychotropic Medications: Yes   Substance Abuse History in the last 12 months:  No.  Consequences of Substance Abuse: NA  Past Medical History:  Past Medical History:  Diagnosis Date  . Anal condyloma   . Anxiety   . Blood dyscrasia    HIV  . Chronic back pain   . Depression   . DJD (degenerative joint disease)   . Gastric ulcer   . GERD (gastroesophageal reflux disease)   . Headache(784.0)   . Hiatal hernia   . HIV infection ([place])   . Hyperplastic colon polyp   . Hypertension   . Internal hemorrhoids      Past Surgical History:  Procedure Laterality Date  . ESOPHAGOGASTRODUODENOSCOPY  03/27/2012   Procedure: ESOPHAGOGASTRODUODENOSCOPY (EGD);  Surgeon: Lear Ng, MD;  Location: [facility];  Service: Endoscopy;  Laterality: N/A;  . IRRIGATION AND DEBRIDEMENT ABSCESS N/A 03/13/2015   Procedure: IRRIGATION AND DEBRIDEMENT ABSCESS;  Surgeon: Johnathan Hausen, MD;  Location: [facility];  Service: General;  Laterality: N/A;  . WISDOM TOOTH EXTRACTION      Family Psychiatric History: denied  Family History:  Family History  Problem Relation Age of Onset  . Cancer Mother        laryngeal  . Pneumonia Father   . Diabetes Father   . Hypertension Sister   . Crohn's disease Brother   . Crohn's disease Maternal Grandmother   . Cancer Maternal Grandmother        patient unsure of type  . Colon cancer Maternal Grandfather     Social History:   Social History   Socioeconomic History  . Marital status: Divorced    Spouse name: Not on file  . Number of children: 1  . Years of education: Not on file  . Highest education level: Not on file  Occupational History  . Occupation: Disabled  Tobacco Use  . Smoking status: Current Every Day Smoker    Packs/day: 1.00    Types: Cigarettes  . Smokeless tobacco: Never Used  .  Tobacco comment: not ready to quit  Vaping Use  . Vaping Use: Never used  Substance and Sexual Activity  . Alcohol use: Yes    Comment: occasionally  . Drug use: Yes    Frequency: 7.0 times per week    Types: Marijuana    Comment: daily  . Sexual activity: Not Currently    Partners: Male  Other Topics Concern  . Not on file  Social History Narrative  . Not on file   Social Determinants of Health   Financial Resource Strain: Not on file  Food Insecurity: Not on file  Transportation Needs: Not on file  Physical Activity: Not on file  Stress: Not on file  Social Connections: Not on file    Additional Social History: Lives with his partner, has a daughter  from previous relationship, works for [employer]  Allergies:   Allergies  Allergen Reactions  . Chantix [Varenicline] Other (See Comments)    Bad dreams  . Oxycodone Itching    Metabolic Disorder Labs: Lab Results  Component Value Date   HGBA1C 5.7 (H) 06/17/2020   MPG 117 (H) 11/16/2013   No results found for: PROLACTIN Lab Results  Component Value Date   CHOL 243 (H) 06/17/2020   TRIG 147 06/17/2020   HDL 61 06/17/2020   CHOLHDL 4.0 06/17/2020   VLDL 28 05/12/2016   LDLCALC 156 (H) 06/17/2020   LDLCALC  08/09/2019     Comment:     . LDL cholesterol not calculated. Triglyceride levels greater than 400 mg/dL invalidate calculated LDL results. . Reference range: <100 . Desirable range <100 mg/dL for primary prevention;   <70 mg/dL for patients with CHD or diabetic patients  with > or = 2 CHD risk factors. Marland Kitchen LDL-C is now calculated using the Martin-Hopkins  calculation, which is a validated novel method providing  better accuracy than the Friedewald equation in the  estimation of LDL-C.  Cresenciano Genre et al. Annamaria Helling. MU:[id number]): 2061-2068  (http://education.QuestDiagnostics.com/faq/FAQ164)    Lab Results  Component Value Date   TSH 1.150 09/01/2017    Therapeutic Level Labs: No results found for: LITHIUM No results found for: CBMZ No results found for: VALPROATE  Current Medications: Current Outpatient Medications  Medication Sig Dispense Refill  . albuterol (VENTOLIN HFA) 108 (90 Base) MCG/ACT inhaler Inhale 2 puffs into the lungs every 6 (six) hours as needed for wheezing or shortness of breath. 18 g 1  . cyclobenzaprine (FLEXERIL) 10 MG tablet TAKE 1 TABLET(10 MG) BY MOUTH TWICE DAILY AS NEEDED FOR MUSCLE SPASMS 40 tablet 2  . darunavir (PREZISTA) 800 MG tablet TAKE 1 TABLET(800 MG) BY MOUTH DAILY 30 tablet 5  . elvitegravir-cobicistat-emtricitabine-tenofovir (GENVOYA) 150-150-200-10 MG TABS tablet TAKE 1 TABLET BY MOUTH DAILY WITH PREZISTA 30 tablet 5  .  famotidine (PEPCID) 40 MG tablet Take 1 tablet (40 mg total) by mouth 2 (two) times daily. 180 tablet 1  . risperiDONE (RISPERDAL) 3 MG tablet Take 1 tablet (3 mg total) by mouth at bedtime. 30 tablet 1  . rosuvastatin (CRESTOR) 20 MG tablet Take 1 tablet (20 mg total) by mouth daily. Stop Pravastatin 30 tablet 5  . sertraline (ZOLOFT) 100 MG tablet Take 1.5 tablets (150 mg total) by mouth daily. 45 tablet 1  . sildenafil (VIAGRA) 50 MG tablet 1 tab po 1/2 hr prior to intercourse. Limit use to 1 tab/24 hrs 30 tablet 3  . traZODone (DESYREL) 100 MG tablet Take 1 to 2 tablets at bedtime as needed for sleep  60 tablet 1  . valACYclovir (VALTREX) 500 MG tablet TAKE 1 TABLET BY MOUTH TWICE DAILY 60 tablet 5   No current facility-administered medications for this visit.    Psychiatric Specialty Exam: Review of Systems  There were no vitals taken for this visit.There is no height or weight on file to calculate BMI.  General Appearance: Well Groomed  Eye Contact:  Good  Speech:  Clear and Coherent and Normal Rate  Volume:  Normal  Mood:  Euthymic  Affect:  Congruent  Thought Process:  Goal Directed and Descriptions of Associations: Intact  Orientation:  Full (Time, Place, and Person)  Thought Content:  Logical  Suicidal Thoughts:  No  Homicidal Thoughts:  No  Memory:  Immediate;   Good Recent;   Good  Judgement:  Intact  Insight:  Fair  Psychomotor Activity:  Normal  Concentration:  Concentration: Good and Attention Span: Good  Recall:  Good  Fund of Knowledge:Good  Language: Good  Akathisia:  Negative  Handed:  Right  AIMS (if indicated):  Not done due to telemed visit  Assets:  Communication Skills Desire for Improvement Financial Resources/Insurance Housing Social Support Transportation Vocational/Educational  ADL's:  Intact  Cognition: WNL  Sleep:  Good   Screenings: GAD-7   Flowsheet Row Office Visit from 06/07/2019 in [facility]  Visit from 04/05/2019 in [place] Office Visit from 09/01/2017 in [place] Office Visit from 11/17/2016 in [place] Office Visit from 10/25/2016 in [place]  Total GAD-7 Score 10 0 16 6 7     PHQ2-9   [place] Office Visit from 04/10/2020 in [facility] Office Visit from 06/07/2019 in [place] Office Visit from 04/05/2019 in [place] Visit from 02/20/2019 in [facility] Office Visit from 12/26/2018 in [facility]  PHQ-2 Total Score 0 0 0 0 0      Assessment and Plan: Patient seems to be doing better, will continue the same regimen for now.   1. Bipolar 1 disorder (HCC)  - sertraline (ZOLOFT) 100 MG tablet; Take 1.5 tablets (150 mg total) by mouth daily.  Dispense: 45 tablet; Refill: 1 - traZODone (DESYREL) 100 MG tablet; Take 1 to 2 tablets at bedtime as needed for sleep  Dispense: 60 tablet; Refill: 1 - risperiDONE (RISPERDAL) 3 MG tablet; Take 1 tablet (3 mg total) by mouth at bedtime.  Dispense: 30 tablet; Refill: 1   F/up in 2 months.  [place] Crane, MD 1/13/20222:23 PM

## 2020-07-11 ENCOUNTER — Other Ambulatory Visit (HOSPITAL_COMMUNITY): Payer: Self-pay | Admitting: Psychiatry

## 2020-07-11 DIAGNOSIS — F319 Bipolar disorder, unspecified: Secondary | ICD-10-CM

## 2020-07-15 ENCOUNTER — Telehealth: Payer: Self-pay | Admitting: Podiatry

## 2020-07-15 ENCOUNTER — Telehealth: Payer: Self-pay

## 2020-07-15 ENCOUNTER — Ambulatory Visit: Payer: 59 | Admitting: Podiatry

## 2020-07-15 ENCOUNTER — Telehealth: Payer: Self-pay | Admitting: *Deleted

## 2020-07-15 NOTE — Telephone Encounter (Signed)
Spoke to patient, he is needing a refill medication on his weekly toenail fungus. He stated he is prescribed it every week. Patient had to get r/s to 07/24/2020. Please advise

## 2020-07-15 NOTE — Telephone Encounter (Signed)
[facility] Patient Advocate Encounter   Received notification from [facility] that prior authorization for GENVOYA is required.   PA submitted on 07/15/20 Key [id number] Status is pending    [facility] will continue to follow.   Ileene Patrick, [facility] Patient [facility] Phone: [phone] Fax:  [phone]

## 2020-07-15 NOTE — Telephone Encounter (Signed)
Patient called stating that his insurance does not cover his Genvoya.  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 The marketplace enrollment is closed so its too late to change insurance plans  I spoke to [place] this am. She is going to talk to Butch Penny to see if there is anything we can do to help this patient get his medication. I called patient to let him know what is going on and that we will let him know what we can do to help. Patient states he does have medication so he is not out . Thanks

## 2020-07-17 ENCOUNTER — Encounter: Payer: Self-pay | Admitting: *Deleted

## 2020-07-17 NOTE — Telephone Encounter (Signed)
Messaged patient through MyChart regarding refill. Appointment needed first.

## 2020-07-18 ENCOUNTER — Telehealth: Payer: Self-pay

## 2020-07-18 NOTE — Telephone Encounter (Signed)
[facility] Patient Advocate Encounter  Prior Authorization for Jorje Guild has been approved.    PA# [id number] Effective dates: 07/18/20 through 07/17/21    [facility] will continue to follow.  Ileene Patrick, [facility] Patient [facility] Phone: [phone] Fax:  [phone]

## 2020-07-18 NOTE — Telephone Encounter (Signed)
Spoke to patient . He was unable to switch his insurance plan. But he got a call today from the pharmacy stating that his insurance is covering his Jorje Guild and I was getting mailed out today.  I am hoping this is a long term fix and nit temporary.

## 2020-07-24 ENCOUNTER — Other Ambulatory Visit: Payer: Self-pay | Admitting: Podiatry

## 2020-07-24 ENCOUNTER — Ambulatory Visit (INDEPENDENT_AMBULATORY_CARE_PROVIDER_SITE_OTHER): Payer: 59 | Admitting: Podiatry

## 2020-07-24 ENCOUNTER — Encounter: Payer: Self-pay | Admitting: Podiatry

## 2020-07-24 ENCOUNTER — Other Ambulatory Visit: Payer: Self-pay

## 2020-07-24 DIAGNOSIS — L603 Nail dystrophy: Secondary | ICD-10-CM

## 2020-07-24 DIAGNOSIS — Z79899 Other long term (current) drug therapy: Secondary | ICD-10-CM

## 2020-07-24 MED ORDER — FLUCONAZOLE 150 MG PO TABS: 300.0000 mg | ORAL_TABLET | ORAL | 0 refills | Status: DC

## 2020-07-25 LAB — COMPLETE METABOLIC PANEL WITH GFR
AG Ratio: 1.5 (calc) (ref 1.0–2.5)
ALT: 24 U/L (ref 9–46)
AST: 26 U/L (ref 10–40)
Albumin: 4.3 g/dL (ref 3.6–5.1)
Alkaline phosphatase (APISO): 81 U/L (ref 36–130)
BUN: 12 mg/dL (ref 7–25)
CO2: 25 mmol/L (ref 20–32)
Calcium: 9.3 mg/dL (ref 8.6–10.3)
Chloride: 107 mmol/L (ref 98–110)
Creat: 1.32 mg/dL (ref 0.60–1.35)
GFR, Est African American: 74 mL/min/{1.73_m2} (ref >=60)
GFR, Est Non African American: 64 mL/min/{1.73_m2} (ref >=60)
Globulin: 2.9 g/dL (calc) (ref 1.9–3.7)
Glucose, Bld: 100 mg/dL — ABNORMAL HIGH (ref 65–99)
Potassium: 4.5 mmol/L (ref 3.5–5.3)
Sodium: 139 mmol/L (ref 135–146)
Total Bilirubin: 0.3 mg/dL (ref 0.2–1.2)
Total Protein: 7.2 g/dL (ref 6.1–8.1)

## 2020-07-25 NOTE — Progress Notes (Signed)
He presents today for follow-up of his fluconazole therapy.  States that his nails are looking better he states that there never been be great but they look a lot better than they did.  He denies any problems taking the medication.  Objective: Vital signs are stable he is alert and oriented x3 pulses are palpable nails appear to be doing much better there is no fungus on the feet.  Assessment: Long-term therapy with fluconazole.  Plan: Requested liver enzymes once again today which have already come back and they are perfectly normal.  He will continue to fluconazole 2 tablets once weekly for the next 3 months follow-up with him at that time.

## 2020-09-08 ENCOUNTER — Other Ambulatory Visit: Payer: Self-pay | Admitting: Internal Medicine

## 2020-09-30 ENCOUNTER — Encounter (HOSPITAL_COMMUNITY): Payer: Self-pay | Admitting: Psychiatry

## 2020-09-30 ENCOUNTER — Other Ambulatory Visit: Payer: Self-pay

## 2020-09-30 ENCOUNTER — Telehealth (INDEPENDENT_AMBULATORY_CARE_PROVIDER_SITE_OTHER): Payer: 59 | Admitting: Psychiatry

## 2020-09-30 DIAGNOSIS — F319 Bipolar disorder, unspecified: Secondary | ICD-10-CM | POA: Diagnosis not present

## 2020-09-30 MED ORDER — TRAZODONE HCL 100 MG PO TABS: ORAL_TABLET | ORAL | 3 refills | Status: DC

## 2020-09-30 MED ORDER — RISPERIDONE 3 MG PO TABS: 3.0000 mg | ORAL_TABLET | Freq: Every day | ORAL | 3 refills | Status: DC

## 2020-09-30 MED ORDER — SERTRALINE HCL 100 MG PO TABS: 150.0000 mg | ORAL_TABLET | Freq: Every day | ORAL | 3 refills | Status: DC

## 2020-09-30 NOTE — Progress Notes (Signed)
[facility] Progress Note    Virtual Visit via Video Note  I connected with Shawn Brooks on 09/30/20 at  2:20 PM EDT by a video enabled telemedicine application and verified that I am speaking with the correct person using two identifiers.  Location: Patient: Home Provider: Clinic   I discussed the limitations of evaluation and management by telemedicine and the availability of in person appointments. The patient expressed understanding and agreed to proceed.  I provided 15 minutes of non-face-to-face time during this encounter.    Patient Identification: Shawn Brooks MRN:  [id number] Date of Evaluation:  09/30/2020    Chief Complaint:  " Everything is going well."   Visit Diagnosis:    ICD-10-CM   1. Bipolar 1 disorder (HCC)  F31.9     History of Present Illness: Patient informed that everything is going well.  He stated that his symptoms of bipolar disorder in remission.  His mood has been stable.  He is sleeping well at night.  He stated that the current combination is helping all his symptoms. He denied any side effects to his regimen.  He denied any other specific concerns.   Past Psychiatric History: Bipolar d/o  Previous Psychotropic Medications: Yes   Substance Abuse History in the last 12 months:  No.  Consequences of Substance Abuse: NA  Past Medical History:  Past Medical History:  Diagnosis Date  . Anal condyloma   . Anxiety   . Blood dyscrasia    HIV  . Chronic back pain   . Depression   . DJD (degenerative joint disease)   . Gastric ulcer   . GERD (gastroesophageal reflux disease)   . Headache(784.0)   . Hiatal hernia   . HIV infection ([place])   . Hyperplastic colon polyp   . Hypertension   . Internal hemorrhoids     Past Surgical History:  Procedure Laterality Date  . ESOPHAGOGASTRODUODENOSCOPY  03/27/2012   Procedure: ESOPHAGOGASTRODUODENOSCOPY (EGD);  Surgeon: Lear Ng, MD;  Location: [facility];  Service: Endoscopy;   Laterality: N/A;  . IRRIGATION AND DEBRIDEMENT ABSCESS N/A 03/13/2015   Procedure: IRRIGATION AND DEBRIDEMENT ABSCESS;  Surgeon: Johnathan Hausen, MD;  Location: [facility];  Service: General;  Laterality: N/A;  . WISDOM TOOTH EXTRACTION      Family Psychiatric History: denied  Family History:  Family History  Problem Relation Age of Onset  . Cancer Mother        laryngeal  . Pneumonia Father   . Diabetes Father   . Hypertension Sister   . Crohn's disease Brother   . Crohn's disease Maternal Grandmother   . Cancer Maternal Grandmother        patient unsure of type  . Colon cancer Maternal Grandfather     Social History:   Social History   Socioeconomic History  . Marital status: Divorced    Spouse name: Not on file  . Number of children: 1  . Years of education: Not on file  . Highest education level: Not on file  Occupational History  . Occupation: Disabled  Tobacco Use  . Smoking status: Current Every Day Smoker    Packs/day: 1.00    Types: Cigarettes  . Smokeless tobacco: Never Used  . Tobacco comment: not ready to quit  Vaping Use  . Vaping Use: Never used  Substance and Sexual Activity  . Alcohol use: Yes    Comment: occasionally  . Drug use: Yes    Frequency: 7.0 times per week  Types: Marijuana    Comment: daily  . Sexual activity: Not Currently    Partners: Male  Other Topics Concern  . Not on file  Social History Narrative  . Not on file   Social Determinants of Health   Financial Resource Strain: Not on file  Food Insecurity: Not on file  Transportation Needs: Not on file  Physical Activity: Not on file  Stress: Not on file  Social Connections: Not on file    Additional Social History: Lives with his partner, has a daughter from previous relationship, works for [employer]  Allergies:   Allergies  Allergen Reactions  . Chantix [Varenicline] Other (See Comments)    Bad dreams  . Oxycodone Itching    Metabolic Disorder Labs: Lab Results   Component Value Date   HGBA1C 5.7 (H) 06/17/2020   MPG 117 (H) 11/16/2013   No results found for: PROLACTIN Lab Results  Component Value Date   CHOL 243 (H) 06/17/2020   TRIG 147 06/17/2020   HDL 61 06/17/2020   CHOLHDL 4.0 06/17/2020   VLDL 28 05/12/2016   LDLCALC 156 (H) 06/17/2020   LDLCALC  08/09/2019     Comment:     . LDL cholesterol not calculated. Triglyceride levels greater than 400 mg/dL invalidate calculated LDL results. . Reference range: <100 . Desirable range <100 mg/dL for primary prevention;   <70 mg/dL for patients with CHD or diabetic patients  with > or = 2 CHD risk factors. Marland Kitchen LDL-C is now calculated using the Martin-Hopkins  calculation, which is a validated novel method providing  better accuracy than the Friedewald equation in the  estimation of LDL-C.  Cresenciano Genre et al. Annamaria Helling. 9323;557(32): 2061-2068  (http://education.[facility].com/faq/FAQ164)    Lab Results  Component Value Date   TSH 1.150 09/01/2017    Therapeutic Level Labs: No results found for: LITHIUM No results found for: CBMZ No results found for: VALPROATE  Current Medications: Current Outpatient Medications  Medication Sig Dispense Refill  . albuterol (VENTOLIN HFA) 108 (90 Base) MCG/ACT inhaler Inhale 2 puffs into the lungs every 6 (six) hours as needed for wheezing or shortness of breath. 18 g 1  . cyclobenzaprine (FLEXERIL) 10 MG tablet TAKE 1 TABLET(10 MG) BY MOUTH TWICE DAILY AS NEEDED FOR MUSCLE SPASMS 40 tablet 2  . darunavir (PREZISTA) 800 MG tablet TAKE 1 TABLET(800 MG) BY MOUTH DAILY 30 tablet 5  . elvitegravir-cobicistat-emtricitabine-tenofovir (GENVOYA) 150-150-200-10 MG TABS tablet TAKE 1 TABLET BY MOUTH DAILY WITH PREZISTA 30 tablet 5  . famotidine (PEPCID) 40 MG tablet TAKE 1 TABLET(40 MG) BY MOUTH TWICE DAILY 180 tablet 0  . fluconazole (DIFLUCAN) 150 MG tablet Take 2 tablets (300 mg total) by mouth once a week. 24 tablet 0  . risperiDONE (RISPERDAL) 3 MG  tablet TAKE 1 TABLET(3 MG) BY MOUTH AT BEDTIME 30 tablet 1  . rosuvastatin (CRESTOR) 20 MG tablet Take 1 tablet (20 mg total) by mouth daily. Stop Pravastatin 30 tablet 5  . sertraline (ZOLOFT) 100 MG tablet Take 1.5 tablets (150 mg total) by mouth daily. 45 tablet 1  . sildenafil (VIAGRA) 50 MG tablet 1 tab po 1/2 hr prior to intercourse. Limit use to 1 tab/24 hrs 30 tablet 3  . traZODone (DESYREL) 100 MG tablet TAKE 1 TO 2 TABLETS BY MOUTH AT BEDTIME AS NEEDED FOR SLEEP 60 tablet 1  . valACYclovir (VALTREX) 500 MG tablet TAKE 1 TABLET BY MOUTH TWICE DAILY 60 tablet 5   No current facility-administered medications for this visit.  Psychiatric Specialty Exam: Review of Systems  There were no vitals taken for this visit.There is no height or weight on file to calculate BMI.  General Appearance: Well Groomed  Eye Contact:  Good  Speech:  Clear and Coherent and Normal Rate  Volume:  Normal  Mood:  Euthymic  Affect:  Congruent  Thought Process:  Goal Directed and Descriptions of Associations: Intact  Orientation:  Full (Time, Place, and Person)  Thought Content:  Logical  Suicidal Thoughts:  No  Homicidal Thoughts:  No  Memory:  Immediate;   Good Recent;   Good  Judgement:  Intact  Insight:  Fair  Psychomotor Activity:  Normal  Concentration:  Concentration: Good and Attention Span: Good  Recall:  Good  Fund of Knowledge:Good  Language: Good  Akathisia:  Negative  Handed:  Right  AIMS (if indicated):  Not done due to telemed visit  Assets:  Communication Skills Desire for Improvement Financial Resources/Insurance Housing Social Support Transportation Vocational/Educational  ADL's:  Intact  Cognition: WNL  Sleep:  Good   Screenings: GAD-7   Personnel officer Visit from 06/07/2019 in [place] Office Visit from 04/05/2019 in [place] Office Visit from 09/01/2017 in [place]  Office Visit from 11/17/2016 in [place] Office Visit from 10/25/2016 in [place]  Total GAD-7 Score 10 0 16 6 7     PHQ2-9   [place] Office Visit from 04/10/2020 in [facility] for Infectious Disease Office Visit from 06/07/2019 in [place] Office Visit from 04/05/2019 in [place] Office Visit from 02/20/2019 in [facility] Office Visit from 12/26/2018 in [facility] for Infectious Disease  PHQ-2 Total Score 0 0 0 0 0      Assessment and Plan: Patient appears to be stable on his current regimen.  1. Bipolar 1 disorder (HCC)  - sertraline (ZOLOFT) 100 MG tablet; Take 1.5 tablets (150 mg total) by mouth daily.  Dispense: 45 tablet; Refill: 1 - traZODone (DESYREL) 100 MG tablet; Take 1 to 2 tablets at bedtime as needed for sleep  Dispense: 60 tablet; Refill: 1 - risperiDONE (RISPERDAL) 3 MG tablet; Take 1 tablet (3 mg total) by mouth at bedtime.  Dispense: 30 tablet; Refill: 1   Continue same medication regimen. Follow up in 4 months.   [place] Crane, MD 4/5/20222:17 PM

## 2020-10-16 ENCOUNTER — Other Ambulatory Visit: Payer: Self-pay | Admitting: Physician Assistant

## 2020-10-23 ENCOUNTER — Ambulatory Visit (INDEPENDENT_AMBULATORY_CARE_PROVIDER_SITE_OTHER): Payer: 59 | Admitting: Podiatry

## 2020-10-23 ENCOUNTER — Encounter: Payer: Self-pay | Admitting: Podiatry

## 2020-10-23 ENCOUNTER — Other Ambulatory Visit: Payer: Self-pay

## 2020-10-23 DIAGNOSIS — L603 Nail dystrophy: Secondary | ICD-10-CM

## 2020-10-23 MED ORDER — FLUCONAZOLE 150 MG PO TABS: 300.0000 mg | ORAL_TABLET | ORAL | 0 refills | Status: DC

## 2020-10-23 NOTE — Progress Notes (Signed)
He presents today for follow-up of his nail fungus has been taking the fluconazole since this time last year he states that they are looking much better his toenails are just slow to grow out.  Objective: Vital signs are stable he is alert oriented x3 pulses are palpable.  His nails are improving by about 85 to 90% he does have a split down the plate of the hallux nail right no bleeding no signs of infection.  Assessment: Resolving onychomycosis with fluconazole.  Plan: He will continue the fluconazole for another 24months and I recommended biotin for the splitting of the nails.

## 2020-11-13 ENCOUNTER — Other Ambulatory Visit: Payer: Self-pay | Admitting: Internal Medicine

## 2020-11-13 DIAGNOSIS — B20 Human immunodeficiency virus [HIV] disease: Secondary | ICD-10-CM

## 2020-12-10 ENCOUNTER — Other Ambulatory Visit: Payer: Self-pay | Admitting: Infectious Diseases

## 2020-12-10 DIAGNOSIS — B20 Human immunodeficiency virus [HIV] disease: Secondary | ICD-10-CM

## 2020-12-16 ENCOUNTER — Other Ambulatory Visit: Payer: Self-pay | Admitting: Internal Medicine

## 2020-12-30 ENCOUNTER — Ambulatory Visit: Payer: 59 | Admitting: Internal Medicine

## 2021-01-09 ENCOUNTER — Telehealth (HOSPITAL_COMMUNITY): Payer: Self-pay | Admitting: Psychiatry

## 2021-01-09 ENCOUNTER — Other Ambulatory Visit (HOSPITAL_COMMUNITY): Payer: Self-pay | Admitting: Psychiatry

## 2021-01-09 DIAGNOSIS — F319 Bipolar disorder, unspecified: Secondary | ICD-10-CM

## 2021-01-09 MED ORDER — RISPERIDONE 3 MG PO TABS: 3.0000 mg | ORAL_TABLET | Freq: Every day | ORAL | 3 refills | Status: DC

## 2021-01-09 MED ORDER — TRAZODONE HCL 100 MG PO TABS: ORAL_TABLET | ORAL | 3 refills | Status: DC

## 2021-01-09 MED ORDER — SERTRALINE HCL 100 MG PO TABS: 150.0000 mg | ORAL_TABLET | Freq: Every day | ORAL | 3 refills | Status: DC

## 2021-01-09 NOTE — Telephone Encounter (Signed)
Patient contacted per provider, rescheduled to next available 8/26. Please bridge medications.

## 2021-01-09 NOTE — Telephone Encounter (Signed)
Medications refilled and sent to preferred pharmacy.

## 2021-01-14 ENCOUNTER — Other Ambulatory Visit: Payer: Self-pay | Admitting: Internal Medicine

## 2021-01-14 DIAGNOSIS — B20 Human immunodeficiency virus [HIV] disease: Secondary | ICD-10-CM

## 2021-01-14 NOTE — Telephone Encounter (Signed)
Has appt 7/22

## 2021-01-14 NOTE — Telephone Encounter (Signed)
   Notes to clinic:   Patient had physical on 06/17/2020 No follow up on file  Review for refill    Requested Prescriptions  Pending Prescriptions Disp Refills   rosuvastatin (CRESTOR) 20 MG tablet [Pharmacy Med Name: ROSUVASTATIN 20MG  TABLETS] 30 tablet 5    Sig: TAKE 1 TABLET(20 MG) BY MOUTH DAILY. STOP PRAVASTATIN      Cardiovascular:  Antilipid - Statins Failed - 01/14/2021 11:54 AM      Failed - Total Cholesterol in normal range and within 360 days    Cholesterol, Total  Date Value Ref Range Status  06/17/2020 243 (H) 100 - 199 mg/dL Final          Failed - LDL in normal range and within 360 days    LDL Cholesterol (Calc)  Date Value Ref Range Status  08/09/2019  mg/dL (calc) Final    Comment:    . LDL cholesterol not calculated. Triglyceride levels greater than 400 mg/dL invalidate calculated LDL results. . Reference range: <100 . Desirable range <100 mg/dL for primary prevention;   <70 mg/dL for patients with CHD or diabetic patients  with > or = 2 CHD risk factors. Marland Kitchen LDL-C is now calculated using the Martin-Hopkins  calculation, which is a validated novel method providing  better accuracy than the Friedewald equation in the  estimation of LDL-C.  Cresenciano Genre et al. Annamaria Helling. 7169;678(93): 2061-2068  (http://education.[facility].com/faq/FAQ164)    LDL Chol Calc (NIH)  Date Value Ref Range Status  06/17/2020 156 (H) 0 - 99 mg/dL Final          Passed - HDL in normal range and within 360 days    HDL  Date Value Ref Range Status  06/17/2020 61 >39 mg/dL Final          Passed - Triglycerides in normal range and within 360 days    Triglycerides  Date Value Ref Range Status  06/17/2020 147 0 - 149 mg/dL Final          Passed - Patient is not pregnant      Passed - Valid encounter within last 12 months    Recent Outpatient Visits           7 months ago Annual physical exam   [place], Deborah B, MD   1 year  ago Mixed hyperlipidemia   [place], MD   1 year ago Acute right flank pain   [place], [place], NP   1 year ago Numbness in both hands   [place], Deborah B, MD   1 year ago [place], MD       Future Appointments             In 2 days Com[place], Okey Regal, MD [facility]

## 2021-01-16 ENCOUNTER — Other Ambulatory Visit (HOSPITAL_COMMUNITY)
Admission: RE | Admit: 2021-01-16 | Discharge: 2021-01-16 | Disposition: A | Discharge status: Home / Self Care | Payer: 59 | Source: Ambulatory Visit | Attending: Internal Medicine | Admitting: Internal Medicine

## 2021-01-16 ENCOUNTER — Other Ambulatory Visit: Payer: Self-pay

## 2021-01-16 ENCOUNTER — Encounter: Payer: Self-pay | Admitting: Internal Medicine

## 2021-01-16 ENCOUNTER — Ambulatory Visit (INDEPENDENT_AMBULATORY_CARE_PROVIDER_SITE_OTHER): Payer: 59 | Admitting: Internal Medicine

## 2021-01-16 VITALS — BP 118/83 | HR 74 | Resp 16 | Ht 68.0 in | Wt 227.0 lb

## 2021-01-16 DIAGNOSIS — Z113 Encounter for screening for infections with a predominantly sexual mode of transmission: Secondary | ICD-10-CM

## 2021-01-16 DIAGNOSIS — B20 Human immunodeficiency virus [HIV] disease: Secondary | ICD-10-CM | POA: Diagnosis present

## 2021-01-16 NOTE — Assessment & Plan Note (Signed)
Will screen today 

## 2021-01-16 NOTE — Assessment & Plan Note (Signed)
He continues to do well, no changes. Continue with Genvoya and Prezista. rtc in 6 months.

## 2021-01-16 NOTE — Progress Notes (Signed)
   Subjective:    Patient ID: Shawn Brooks, male    DOB: March 18, 1976, 45 y.o.   MRN: [id number]  HPI Here for follow up of HIV He continues on Genvoya and Prezista with no missed doses.  He has had no issues with obtaining, taking or tolerating his medication.   Review of Systems  Gastrointestinal:  Negative for diarrhea and nausea.  Skin:  Negative for rash.      Objective:   Physical Exam Eyes:     General: No scleral icterus. Pulmonary:     Effort: Pulmonary effort is normal.  Skin:    Findings: No rash.  Neurological:     General: No focal deficit present.     Mental Status: He is alert.          Assessment & Plan:

## 2021-01-19 LAB — URINE CYTOLOGY ANCILLARY ONLY
Chlamydia: NEGATIVE
Comment: NEGATIVE
Comment: NORMAL
Neisseria Gonorrhea: NEGATIVE

## 2021-01-20 LAB — T-HELPER CELLS (CD4) COUNT (NOT AT ARMC)
Absolute CD4: 906 cells/uL (ref 490–1740)
CD4 T Helper %: 34 % (ref 30–61)
Total lymphocyte count: 2700 cells/uL (ref 850–3900)

## 2021-01-20 LAB — HIV-1 RNA QUANT-NO REFLEX-BLD
HIV 1 RNA Quant: NOT DETECTED Copies/mL
HIV-1 RNA Quant, Log: NOT DETECTED Log cps/mL

## 2021-01-20 LAB — RPR: RPR Ser Ql: NONREACTIVE

## 2021-01-21 ENCOUNTER — Encounter (HOSPITAL_COMMUNITY): Payer: 59 | Admitting: Psychiatry

## 2021-01-21 ENCOUNTER — Telehealth (HOSPITAL_COMMUNITY): Payer: 59 | Admitting: Psychiatry

## 2021-01-22 ENCOUNTER — Other Ambulatory Visit: Payer: Self-pay

## 2021-01-22 ENCOUNTER — Encounter: Payer: Self-pay | Admitting: Podiatry

## 2021-01-22 ENCOUNTER — Ambulatory Visit (INDEPENDENT_AMBULATORY_CARE_PROVIDER_SITE_OTHER): Payer: 59 | Admitting: Podiatry

## 2021-01-22 DIAGNOSIS — L603 Nail dystrophy: Secondary | ICD-10-CM

## 2021-01-22 NOTE — Progress Notes (Signed)
He presents today for follow-up of his nail fungus.  He had taken fluconazole since April of last year.  States that they have still needs some work the he did not get the biotin that I had recommended last time.  Objective: Vital signs are stable alert and oriented x3.  Pulses are palpable.  Nails have grown out a lot and they appear to be healing as far as the yeast goes however I think there is more nail dystrophy then we were hoping at this point I think we should stop treating the mycosis and allow him to concentrate on thinning the nails and keeping the nails cut short.  I will follow-up with him on an as-needed basis.  Assessment: Onychomycosis.  Plan: Discontinue fluconazole follow-up with me as needed continue nail maintenance.

## 2021-02-16 ENCOUNTER — Other Ambulatory Visit: Payer: Self-pay | Admitting: Internal Medicine

## 2021-02-16 NOTE — Telephone Encounter (Signed)
Requested medication (s) are due for refill today: yes  Requested medication (s) are on the active medication list: yes   Last refill:  01/15/2021  Future visit scheduled:  no  Notes to clinic:  overdue for follow up  appointment  Message sent to patient to contact office    Requested Prescriptions  Pending Prescriptions Disp Refills   rosuvastatin (CRESTOR) 20 MG tablet [Pharmacy Med Name: ROSUVASTATIN '20MG'$  TABLETS] 30 tablet 0    Sig: TAKE 1 TABLET(20 MG) BY MOUTH DAILY. STOP PRAVASTATIN     Cardiovascular:  Antilipid - Statins Failed - 02/16/2021  1:24 PM      Failed - Total Cholesterol in normal range and within 360 days    Cholesterol, Total  Date Value Ref Range Status  06/17/2020 243 (H) 100 - 199 mg/dL Final          Failed - LDL in normal range and within 360 days    LDL Cholesterol (Calc)  Date Value Ref Range Status  08/09/2019  mg/dL (calc) Final    Comment:    . LDL cholesterol not calculated. Triglyceride levels greater than 400 mg/dL invalidate calculated LDL results. . Reference range: <100 . Desirable range <100 mg/dL for primary prevention;   <70 mg/dL for patients with CHD or diabetic patients  with > or = 2 CHD risk factors. Marland Kitchen LDL-C is now calculated using the Martin-Hopkins  calculation, which is a validated novel method providing  better accuracy than the Friedewald equation in the  estimation of LDL-C.  Cresenciano Genre et al. Annamaria Helling. [id number]): 2061-2068  (http://education.[facility].com/faq/FAQ164)    LDL Chol Calc (NIH)  Date Value Ref Range Status  06/17/2020 156 (H) 0 - 99 mg/dL Final          Passed - HDL in normal range and within 360 days    HDL  Date Value Ref Range Status  06/17/2020 61 >39 mg/dL Final          Passed - Triglycerides in normal range and within 360 days    Triglycerides  Date Value Ref Range Status  06/17/2020 147 0 - 149 mg/dL Final          Passed - Patient is not pregnant      Passed - Valid  encounter within last 12 months    Recent Outpatient Visits           8 months ago Annual physical exam   [place], Deborah B, MD   1 year ago Mixed hyperlipidemia   [place], MD   1 year ago Acute right flank pain   [place], [place], NP   1 year ago Numbness in both hands   [place], MD   1 year ago [place], MD       Future Appointments             In 5 months Comer, Okey Regal, MD [facility]

## 2021-02-20 ENCOUNTER — Telehealth (HOSPITAL_COMMUNITY): Payer: 59 | Admitting: Psychiatry

## 2021-03-03 ENCOUNTER — Encounter (HOSPITAL_COMMUNITY): Payer: 59 | Admitting: Psychiatry

## 2021-03-19 ENCOUNTER — Other Ambulatory Visit: Payer: Self-pay | Admitting: Internal Medicine

## 2021-04-09 ENCOUNTER — Ambulatory Visit: Payer: 59

## 2021-04-20 ENCOUNTER — Telehealth: Payer: 59 | Admitting: Internal Medicine

## 2021-04-20 ENCOUNTER — Ambulatory Visit: Payer: 59 | Admitting: Internal Medicine

## 2021-04-20 ENCOUNTER — Encounter: Payer: Self-pay | Admitting: Internal Medicine

## 2021-04-20 ENCOUNTER — Ambulatory Visit: Payer: 59

## 2021-04-20 ENCOUNTER — Other Ambulatory Visit: Payer: Self-pay

## 2021-04-21 ENCOUNTER — Other Ambulatory Visit: Payer: Self-pay | Admitting: Internal Medicine

## 2021-04-21 NOTE — Telephone Encounter (Signed)
Requested medication (s) are due for refill today:   Yes  Requested medication (s) are on the active medication list:   Yes  Future visit scheduled:   Yes 06/11/2021   Last ordered: 03/19/2021 #30, 0 refills  Returned because note is no further refills until seen.   30 day courtesy refill was given in Sept.   Provider to review for refills prior to upcoming appt.   Requested Prescriptions  Pending Prescriptions Disp Refills   rosuvastatin (CRESTOR) 20 MG tablet [Pharmacy Med Name: ROSUVASTATIN 20MG  TABLETS] 30 tablet 0    Sig: TAKE 1 TABLET(20 MG) BY MOUTH DAILY. STOP PRAVASTATIN     Cardiovascular:  Antilipid - Statins Failed - 04/21/2021 11:27 AM      Failed - Total Cholesterol in normal range and within 360 days    Cholesterol, Total  Date Value Ref Range Status  06/17/2020 243 (H) 100 - 199 mg/dL Final          Failed - LDL in normal range and within 360 days    LDL Cholesterol (Calc)  Date Value Ref Range Status  08/09/2019  mg/dL (calc) Final    Comment:    . LDL cholesterol not calculated. Triglyceride levels greater than 400 mg/dL invalidate calculated LDL results. . Reference range: <100 . Desirable range <100 mg/dL for primary prevention;   <70 mg/dL for patients with CHD or diabetic patients  with > or = 2 CHD risk factors. Marland Kitchen LDL-C is now calculated using the Martin-Hopkins  calculation, which is a validated novel method providing  better accuracy than the Friedewald equation in the  estimation of LDL-C.  Cresenciano Genre et al. Annamaria Helling. 3568;616(83): 2061-2068  (http://education.[facility].com/faq/FAQ164)    LDL Chol Calc (NIH)  Date Value Ref Range Status  06/17/2020 156 (H) 0 - 99 mg/dL Final          Passed - HDL in normal range and within 360 days    HDL  Date Value Ref Range Status  06/17/2020 61 >39 mg/dL Final          Passed - Triglycerides in normal range and within 360 days    Triglycerides  Date Value Ref Range Status  06/17/2020 147 0  - 149 mg/dL Final          Passed - Patient is not pregnant      Passed - Valid encounter within last 12 months    Recent Outpatient Visits           10 months ago Annual physical exam   [place]ell [place], MD   1 year ago Mixed hyperlipidemia   [place], MD   1 year ago Acute right flank pain   [place], [place], NP   1 year ago Numbness in both hands   [place], Deborah B, MD   2 years ago [place], MD       Future Appointments             In 1 month Wyn[place] Emery Dalbert Batman, MD [place]   In 3 months Comer, Okey Regal, MD [facility]

## 2021-05-01 ENCOUNTER — Telehealth (INDEPENDENT_AMBULATORY_CARE_PROVIDER_SITE_OTHER): Payer: 59 | Admitting: Physician Assistant

## 2021-05-01 DIAGNOSIS — F319 Bipolar disorder, unspecified: Secondary | ICD-10-CM | POA: Diagnosis not present

## 2021-05-01 MED ORDER — RISPERIDONE 3 MG PO TABS: 3.0000 mg | ORAL_TABLET | Freq: Every day | ORAL | 3 refills | Status: DC

## 2021-05-01 MED ORDER — TRAZODONE HCL 100 MG PO TABS: ORAL_TABLET | ORAL | 3 refills | Status: DC

## 2021-05-01 MED ORDER — SERTRALINE HCL 100 MG PO TABS: 150.0000 mg | ORAL_TABLET | Freq: Every day | ORAL | 3 refills | Status: DC

## 2021-05-01 NOTE — Progress Notes (Signed)
[place] MD/PA/NP OP Progress Note  Virtual Visit via Video Note  I connected with Shawn Brooks on 05/02/21 at  4:30 PM EDT by a video enabled telemedicine application and verified that I am speaking with the correct person using two identifiers.  Location: Patient: Home Provider: Clinic   I discussed the limitations of evaluation and management by telemedicine and the availability of in person appointments. The patient expressed understanding and agreed to proceed.  Follow Up Instructions:  I discussed the assessment and treatment plan with the patient. The patient was provided an opportunity to ask questions and all were answered. The patient agreed with the plan and demonstrated an understanding of the instructions.   The patient was advised to call back or seek an in-person evaluation if the symptoms worsen or if the condition fails to improve as anticipated.  I provided 17 minutes of non-face-to-face time during this encounter.  [name], PA   05/01/2021 4:36 PM [facility] [address] Dials  MRN:  [id number]  Chief Complaint: Follow up and medication management  HPI:   Shawn Brooks is a 45 year old male with a past psychiatric history significant for bipolar disorder who presents to [facility] via virtual video visit for follow-up and medication management.  Patient is currently being managed on the following medications:  Trazodone 100 mg at bedtime Sertraline 100 mg daily Risperidone 3 mg at bedtime  Patient reports no issues or concerns regarding his current medication regimen.  Patient denies the need for dosage adjustments at this time and is requesting refills on all his medications following the conclusion of the encounter.  Patient does express that his anxiety has been elevated some and rates his anxiety at a 4 or 5 out of 10.  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  Provider recommended patient be placed on hydroxyzine for the management of his anxiety.  Patient states that he has been on hydroxyzine before in the past and that it was not as helpful.  Patient is okay to continue taking his medications as is.  A GAD-7 screen was performed with the patient scoring a 4.  Patient is alert and oriented x4, calm, cooperative, and fully engaged in conversation during the encounter.  Patient reports being in a chill mood.  Patient denies suicidal or homicidal ideations.  He further denies auditory or visual hallucinations and does not appear to be responding to internal/external stimuli.  Patient endorses good sleep and receives on average 8 to 9 hours of sleep each night.  Patient endorses good appetite and eats on average 1-2 meals per day.  Patient endorses alcohol consumption and drinks roughly 212 ounces of beer each day.  Patient endorses tobacco use and smokes on average a third of a pack per day.  Patient endorses illicit drug use in the form of marijuana stating that he smokes marijuana daily.  Visit Diagnosis:    ICD-10-CM   1. Bipolar 1 disorder (HCC)  F31.9 traZODone (DESYREL) 100 MG tablet    sertraline (ZOLOFT) 100 MG tablet    risperiDONE (RISPERDAL) 3 MG tablet      Past Psychiatric History:  Bipolar 1 disorder  Past Medical History:  Past Medical History:  Diagnosis Date   Anal condyloma    Anxiety    Blood dyscrasia    HIV   Chronic back pain    Depression    DJD (degenerative joint  disease)    Gastric ulcer    GERD (gastroesophageal reflux disease)    Headache(784.0)    Hiatal hernia    HIV infection ([place])    Hyperplastic colon polyp    Hypertension    Internal hemorrhoids     Past Surgical History:  Procedure Laterality Date   ESOPHAGOGASTRODUODENOSCOPY  03/27/2012   Procedure: ESOPHAGOGASTRODUODENOSCOPY (EGD);  Surgeon: Lear Ng, MD;  Location: [facility];  Service: Endoscopy;  Laterality: N/A;    IRRIGATION AND DEBRIDEMENT ABSCESS N/A 03/13/2015   Procedure: IRRIGATION AND DEBRIDEMENT ABSCESS;  Surgeon: Johnathan Hausen, MD;  Location: [facility];  Service: General;  Laterality: N/A;   WISDOM TOOTH EXTRACTION      Family Psychiatric History:  Denied  Family History:  Family History  Problem Relation Age of Onset   Cancer Mother        laryngeal   Pneumonia Father    Diabetes Father    Hypertension Sister    Crohn's disease Brother    Crohn's disease Maternal Grandmother    Cancer Maternal Grandmother        patient unsure of type   Colon cancer Maternal Grandfather     Social History:  Social History   Socioeconomic History   Marital status: Divorced    Spouse name: Not on file   Number of children: 1   Years of education: Not on file   Highest education level: Not on file  Occupational History   Occupation: Disabled  Tobacco Use   Smoking status: Every Day    Packs/day: 1.00    Types: Cigarettes   Smokeless tobacco: Never   Tobacco comments:    not ready to quit  Vaping Use   Vaping Use: Never used  Substance and Sexual Activity   Alcohol use: Yes    Comment: occasionally   Drug use: Yes    Frequency: 7.0 times per week    Types: Marijuana    Comment: daily   Sexual activity: Not Currently    Partners: Male  Other Topics Concern   Not on file  Social History Narrative   Not on file   Social Determinants of Health   Financial Resource Strain: Not on file  Food Insecurity: Not on file  Transportation Needs: Not on file  Physical Activity: Not on file  Stress: Not on file  Social Connections: Not on file    Allergies:  Allergies  Allergen Reactions   Chantix [Varenicline] Other (See Comments)    Bad dreams   Oxycodone Itching    Metabolic Disorder Labs: Lab Results  Component Value Date   HGBA1C 5.7 (H) 06/17/2020   MPG 117 (H) 11/16/2013   No results found for: PROLACTIN Lab Results  Component Value Date   CHOL 243 (H) 06/17/2020    TRIG 147 06/17/2020   HDL 61 06/17/2020   CHOLHDL 4.0 06/17/2020   VLDL 28 05/12/2016   LDLCALC 156 (H) 06/17/2020   [place]  08/09/2019     Comment:     . LDL cholesterol not calculated. Triglyceride levels greater than 400 mg/dL invalidate calculated LDL results. . Reference range: <100 . Desirable range <100 mg/dL for primary prevention;   <70 mg/dL for patients with CHD or diabetic patients  with > or = 2 CHD risk factors. Marland Kitchen LDL-C is now calculated using the Martin-Hopkins  calculation, which is a validated novel method providing  better accuracy than the Friedewald equation in the  estimation of LDL-C.  Cresenciano Genre et al.  JAMA. 4098;119(14): [phone]  (http://education.[facility].com/faq/FAQ164)    Lab Results  Component Value Date   TSH 1.150 09/01/2017   TSH 1.080 01/27/2017    Therapeutic Level Labs: No results found for: LITHIUM No results found for: VALPROATE No components found for:  CBMZ  Current Medications: Current Outpatient Medications  Medication Sig Dispense Refill   albuterol (VENTOLIN HFA) 108 (90 Base) MCG/ACT inhaler Inhale 2 puffs into the lungs every 6 (six) hours as needed for wheezing or shortness of breath. 18 g 1   cyclobenzaprine (FLEXERIL) 10 MG tablet TAKE 1 TABLET(10 MG) BY MOUTH TWICE DAILY AS NEEDED FOR MUSCLE SPASMS 40 tablet 2   famotidine (PEPCID) 40 MG tablet TAKE 1 TABLET(40 MG) BY MOUTH TWICE DAILY 180 tablet 0   fluconazole (DIFLUCAN) 150 MG tablet Take 2 tablets (300 mg total) by mouth once a week. 24 tablet 0   GENVOYA 150-150-200-10 MG TABS tablet TAKE 1 TABLET BY MOUTH DAILY WITH PREZISTA 30 tablet 5   oxybutynin (DITROPAN-XL) 10 MG 24 hr tablet Take 10 mg by mouth daily.     PREZISTA 800 MG tablet Take 800 mg by mouth daily.     PREZISTA 800 MG tablet TAKE 1 TABLET(800 MG) BY MOUTH DAILY 30 tablet 5   risperiDONE (RISPERDAL) 3 MG tablet Take 1 tablet (3 mg total) by mouth at bedtime. 30 tablet 3   rosuvastatin  (CRESTOR) 20 MG tablet TAKE 1 TABLET(20 MG) BY MOUTH DAILY. STOP PRAVASTATIN 30 tablet 1   sertraline (ZOLOFT) 100 MG tablet Take 1.5 tablets (150 mg total) by mouth daily. 45 tablet 3   sildenafil (VIAGRA) 50 MG tablet 1 tab po 1/2 hr prior to intercourse. Limit use to 1 tab/24 hrs 30 tablet 3   traZODone (DESYREL) 100 MG tablet TAKE 1 TO 2 TABLETS BY MOUTH AT BEDTIME AS NEEDED FOR SLEEP 60 tablet 3   valACYclovir (VALTREX) 500 MG tablet TAKE 1 TABLET BY MOUTH TWICE DAILY 60 tablet 5   No current facility-administered medications for this visit.     Musculoskeletal: Strength & Muscle Tone: within normal limits Gait & Station: normal Patient leans: N/A  Psychiatric Specialty Exam: Review of Systems  Psychiatric/Behavioral:  Negative for decreased concentration, dysphoric mood, hallucinations, self-injury, sleep disturbance and suicidal ideas. The patient is nervous/anxious. The patient is not hyperactive.    There were no vitals taken for this visit.There is no height or weight on file to calculate BMI.  General Appearance: Well Groomed  Eye Contact:  Good  Speech:  Clear and Coherent and Normal Rate  Volume:  Normal  Mood:  Anxious and Euthymic  Affect:  Appropriate and Congruent  Thought Process:  Coherent and Descriptions of Associations: Intact  Orientation:  Full (Time, Place, and Person)  Thought Content: WDL   Suicidal Thoughts:  No  Homicidal Thoughts:  No  Memory:  Immediate;   Good Recent;   Good Remote;   Good  Judgement:  Good  Insight:  Good  Psychomotor Activity:  Normal  Concentration:  Concentration: Good and Attention Span: Good  Recall:  Good  Fund of Knowledge: Good  Language: Good  Akathisia:  Negative  Handed:  Right  AIMS (if indicated): not done  Assets:  Communication Skills Desire for Improvement Financial Resources/Insurance Housing Social Support Transportation Vocational/Educational  ADL's:  Intact  Cognition: WNL  Sleep:  Good    Screenings: GAD-7    Flowsheet Row Video Visit from 05/01/2021 in [facility] Office Visit from 06/07/2019  in [place] Office Visit from 04/05/2019 in [place] Office Visit from 09/01/2017 in [place] Office Visit from 11/17/2016 in [place]  Total GAD-7 Score 4 10 0 16 6      PHQ2-9    Flowsheet Row Video Visit from 05/01/2021 in [facility] Office Visit from 01/16/2021 in [facility] Office Visit from 04/10/2020 in [facility] Office Visit from 06/07/2019 in [place] Office Visit from 04/05/2019 in [place]  PHQ-2 Total Score 1 0 0 0 0      Flowsheet Row Video Visit from 05/01/2021 in [place] No Risk        Assessment and Plan:   Victorino M. Phommachanh is a 45 year old male with a past psychiatric history significant for bipolar disorder who presents to [facility] via virtual video visit for follow-up and medication management.  Patient reports no issues or concerns regarding his current medication regimen.  Patient denies the need for dosage adjustments at this time and is requesting refills on all his medications following the conclusion of the encounter.  Patient endorses some anxiety and has taken hydroxyzine in the past with little success.  Patient is comfortable with taking his medications as followed and discussing anxiety on the next encounter.  Patient's medications to be e-prescribed to pharmacy of choice.  1. Bipolar 1 disorder (HCC)  - traZODone (DESYREL) 100 MG tablet; TAKE 1 TO 2 TABLETS BY MOUTH AT BEDTIME AS NEEDED FOR SLEEP  Dispense: 60 tablet; Refill: 3 -  sertraline (ZOLOFT) 100 MG tablet; Take 1.5 tablets (150 mg total) by mouth daily.  Dispense: 45 tablet; Refill: 3 - risperiDONE (RISPERDAL) 3 MG tablet; Take 1 tablet (3 mg total) by mouth at bedtime.  Dispense: 30 tablet; Refill: 3  Patient to follow up in 6 months Provider spent a total of 21 minutes with the patient/reviewing the patient's chart  [name], PA 05/01/2021, 4:36 PM

## 2021-05-02 ENCOUNTER — Encounter (HOSPITAL_COMMUNITY): Payer: Self-pay | Admitting: Physician Assistant

## 2021-06-11 ENCOUNTER — Other Ambulatory Visit: Payer: Self-pay

## 2021-06-11 ENCOUNTER — Ambulatory Visit: Payer: 59 | Attending: Internal Medicine | Admitting: Internal Medicine

## 2021-06-11 VITALS — BP 106/71 | HR 76 | Ht 69.0 in | Wt 232.2 lb

## 2021-06-11 DIAGNOSIS — E782 Mixed hyperlipidemia: Secondary | ICD-10-CM

## 2021-06-11 DIAGNOSIS — R7303 Prediabetes: Secondary | ICD-10-CM

## 2021-06-11 DIAGNOSIS — E669 Obesity, unspecified: Secondary | ICD-10-CM

## 2021-06-11 DIAGNOSIS — Z2821 Immunization not carried out because of patient refusal: Secondary | ICD-10-CM

## 2021-06-11 DIAGNOSIS — F17200 Nicotine dependence, unspecified, uncomplicated: Secondary | ICD-10-CM | POA: Diagnosis not present

## 2021-06-11 DIAGNOSIS — F319 Bipolar disorder, unspecified: Secondary | ICD-10-CM

## 2021-06-11 MED ORDER — ROSUVASTATIN CALCIUM 20 MG PO TABS: ORAL_TABLET | ORAL | 4 refills | Status: DC

## 2021-06-11 NOTE — Progress Notes (Signed)
Patient ID: NYLE LIMB, male    DOB: Aug 26, 1975  MRN: [id number]  CC: Follow-up   Subjective: Shawn Brooks is a 45 y.o. male who presents for chronic ds management His concerns today include:  HIV, DJD,  hyperlipidemia, anxiety, bipolar 1  (followed by psychiatry ), tobacco dependence, vit D deficiency, numbness in hands (neg EMG 2019), chronic bilateral lower back pain , rectal fissure, internal hemorrhoids.  HL: taking and tolerating Crestor.  Obesity/PreDM: Patient reports he is eating less fried foods but loves pizza and pasta.  Drinks water 90% of the times.  Otherwise he drinks a 12-16 oz cans of beer a day.  Eats fruits occasionally.  Getting in some vegetables in his diet.   -not much exercise.  "Just lazy."  Also reports he has Shin splints in the right lower leg that aches after he walks about  3/4 mile  Tob dep:  not ready to quit.    Bipolar1: Followed by behavioral health.  He is on Risperdal, trazodone and Zoloft.  HIV:  taking and tolerating his antiviral treatment.  Last viral load done in July of this year was undetectable. Patient Active Problem List   Diagnosis Date Noted   Influenza vaccine refused 06/17/2020   Esophageal dysphagia 06/17/2020   Mixed hyperlipidemia 06/17/2020   Obesity (BMI 30.0-34.9) 06/17/2020   History of farsightedness 06/17/2020   Bipolar 1 disorder ([place]) 05/01/2020   Chronic night sweats 04/10/2020   Counseled about COVID-19 virus infection 09/18/2019   Urinary frequency 12/26/2018   Medication monitoring encounter 08/08/2018   Memory problem 09/01/2017   Vitamin D deficiency 09/01/2017   Primary osteoarthritis of left knee 05/02/2017   Stage 3 chronic kidney disease ([place]) 05/02/2017   Prediabetes 12/22/2016   Tobacco abuse 05/31/2016   Lumbar transverse process fracture ([place]) 10/02/2015   Screening examination for sexually transmitted disease 04/04/2014   Seizures ([place]) 08/30/2013   Thoracic or lumbosacral neuritis or  radiculitis 01/15/2013   H/O gastrointestinal disease 01/15/2013   Lumbar radiculopathy 01/15/2013   Carcinoma in situ of anal canal 11/17/2012   Condyloma acuminatum 11/17/2012   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   AGW (anogenital warts) 11/17/2012   Hemorrhoid 09/27/2012   BRBPR (bright red blood per rectum) 07/27/2012   Prostatitis 04/12/2012   Constipation 03/09/2012   Callus 08/16/2011   Ingrown toenail 08/16/2011   Chronic low back pain 08/16/2011   Dyslipidemia 09/03/2010   Herpes simplex virus (HSV) infection 07/06/2010   Erectile dysfunction 07/06/2010   ANXIETY DEPRESSION 05/06/2010   BURSITIS, KNEE 05/06/2010   Human immunodeficiency virus (HIV) disease ([place]) 04/16/2010     Current Outpatient Medications on File Prior to Visit  Medication Sig Dispense Refill   albuterol (VENTOLIN HFA) 108 (90 Base) MCG/ACT inhaler Inhale 2 puffs into the lungs every 6 (six) hours as needed for wheezing or shortness of breath. 18 g 1   cyclobenzaprine (FLEXERIL) 10 MG tablet TAKE 1 TABLET(10 MG) BY MOUTH TWICE DAILY AS NEEDED FOR MUSCLE SPASMS 40 tablet 2   famotidine (PEPCID) 40 MG tablet TAKE 1 TABLET(40 MG) BY MOUTH TWICE DAILY 180 tablet 0   GENVOYA 150-150-200-10 MG TABS tablet TAKE 1 TABLET BY MOUTH DAILY WITH PREZISTA 30 tablet 5   oxybutynin (DITROPAN-XL) 10 MG 24 hr tablet Take 10 mg by mouth daily.     PREZISTA 800 MG tablet Take 800 mg by mouth daily.     PREZISTA 800 MG tablet TAKE 1 TABLET(800 MG) BY MOUTH  DAILY 30 tablet 5   risperiDONE (RISPERDAL) 3 MG tablet Take 1 tablet (3 mg total) by mouth at bedtime. 30 tablet 3   rosuvastatin (CRESTOR) 20 MG tablet TAKE 1 TABLET(20 MG) BY MOUTH DAILY. STOP PRAVASTATIN 30 tablet 1   sertraline (ZOLOFT) 100 MG tablet Take 1.5 tablets (150 mg total) by mouth daily. 45 tablet 3   traZODone (DESYREL) 100 MG tablet TAKE 1 TO 2 TABLETS BY MOUTH AT BEDTIME AS NEEDED FOR SLEEP 60 tablet 3   valACYclovir (VALTREX) 500  MG tablet TAKE 1 TABLET BY MOUTH TWICE DAILY 60 tablet 5   fluconazole (DIFLUCAN) 150 MG tablet Take 2 tablets (300 mg total) by mouth once a week. (Patient not taking: Reported on 06/11/2021) 24 tablet 0   sildenafil (VIAGRA) 50 MG tablet 1 tab po 1/2 hr prior to intercourse. Limit use to 1 tab/24 hrs (Patient not taking: Reported on 06/11/2021) 30 tablet 3   No current facility-administered medications on file prior to visit.    Allergies  Allergen Reactions   Chantix [Varenicline] Other (See Comments)    Bad dreams   Oxycodone Itching    Social History   Socioeconomic History   Marital status: Divorced    Spouse name: Not on file   Number of children: 1   Years of education: Not on file   Highest education level: Not on file  Occupational History   Occupation: Disabled  Tobacco Use   Smoking status: Every Day    Packs/day: 1.00    Types: Cigarettes   Smokeless tobacco: Never   Tobacco comments:    not ready to quit  Vaping Use   Vaping Use: Never used  Substance and Sexual Activity   Alcohol use: Yes    Comment: occasionally   Drug use: Yes    Frequency: 7.0 times per week    Types: Marijuana    Comment: daily   Sexual activity: Not Currently    Partners: Male  Other Topics Concern   Not on file  Social History Narrative   Not on file   Social Determinants of Health   Financial Resource Strain: Not on file  Food Insecurity: Not on file  Transportation Needs: Not on file  Physical Activity: Not on file  Stress: Not on file  Social Connections: Not on file  Intimate Partner Violence: Not on file    Family History  Problem Relation Age of Onset   Cancer Mother        laryngeal   Pneumonia Father    Diabetes Father    Hypertension Sister    Crohn's disease Brother    Crohn's disease Maternal Grandmother    Cancer Maternal Grandmother        patient unsure of type   Colon cancer Maternal Grandfather     Past Surgical History:  Procedure  Laterality Date   ESOPHAGOGASTRODUODENOSCOPY  03/27/2012   Procedure: ESOPHAGOGASTRODUODENOSCOPY (EGD);  Surgeon: Lear Ng, MD;  Location: [facility];  Service: Endoscopy;  Laterality: N/A;   IRRIGATION AND DEBRIDEMENT ABSCESS N/A 03/13/2015   Procedure: IRRIGATION AND DEBRIDEMENT ABSCESS;  Surgeon: Johnathan Hausen, MD;  Location: [facility];  Service: General;  Laterality: N/A;   WISDOM TOOTH EXTRACTION      ROS: Review of Systems Negative except as stated above  PHYSICAL EXAM: BP 106/71    Pulse 76    Ht 5\' 9"  (1.753 m)    Wt 232 lb 3.2 oz (105.3 kg)    SpO2 99%  BMI 34.29 kg/m   Wt Readings from Last 3 Encounters:  06/11/21 232 lb 3.2 oz (105.3 kg)  01/16/21 227 lb (103 kg)  06/17/20 225 lb (102.1 kg)    Physical Exam   General appearance - alert, well appearing, and in no distress Mental status - normal mood, behavior, speech, dress, motor activity, and thought processes Neck - supple, no significant adenopathy Chest - clear to auscultation, no wheezes, rales or rhonchi, symmetric air entry Heart - normal rate, regular rhythm, normal S1, S2, no murmurs, rubs, clicks or gallops Extremities - peripheral pulses normal, no pedal edema, no clubbing or cyanosis  CMP Latest Ref Rng & Units 07/24/2020 01/03/2020 08/09/2019  Glucose 65 - 99 mg/dL 100(H) 89 93  BUN 7 - 25 mg/dL 12 13 11   Creatinine 0.60 - 1.35 mg/dL 1.32 1.29 1.26  Sodium 135 - 146 mmol/L 139 137 137  Potassium 3.5 - 5.3 mmol/L 4.5 4.1 4.0  Chloride 98 - 110 mmol/L 107 104 104  CO2 20 - 32 mmol/L 25 24 27   Calcium 8.6 - 10.3 mg/dL 9.3 9.4 9.0  Total Protein 6.1 - 8.1 g/dL 7.2 7.2 6.6  Total Bilirubin 0.2 - 1.2 mg/dL 0.3 0.4 0.3  Alkaline Phos 38 - 126 U/L - - -  AST 10 - 40 U/L 26 26 27   ALT 9 - 46 U/L 24 25 27    Lipid Panel     Component Value Date/Time   CHOL 243 (H) 06/17/2020 0929   TRIG 147 06/17/2020 0929   HDL 61 06/17/2020 0929   CHOLHDL 4.0 06/17/2020 0929   CHOLHDL 4.7 08/09/2019 0922    VLDL 28 05/12/2016 0955   LDLCALC 156 (H) 06/17/2020 0929   LDLCALC  08/09/2019 3875     Comment:     . LDL cholesterol not calculated. Triglyceride levels greater than 400 mg/dL invalidate calculated LDL results. . Reference range: <100 . Desirable range <100 mg/dL for primary prevention;   <70 mg/dL for patients with CHD or diabetic patients  with > or = 2 CHD risk factors. Marland Kitchen LDL-C is now calculated using the Martin-Hopkins  calculation, which is a validated novel method providing  better accuracy than the Friedewald equation in the  estimation of LDL-C.  Cresenciano Genre et al. Annamaria Helling. 6433;295(18): 2061-2068  (http://education.[facility].com/faq/FAQ164)     CBC    Component Value Date/Time   WBC 12.9 (H) 08/09/2019 0922   RBC 4.84 08/09/2019 0922   HGB 15.3 08/09/2019 0922   HGB 15.9 12/22/2016 1005   HCT 43.9 08/09/2019 0922   HCT 46.9 12/22/2016 1005   PLT 200 08/09/2019 0922   PLT 225 12/22/2016 1005   MCV 90.7 08/09/2019 0922   MCV 89 12/22/2016 1005   MCH 31.6 08/09/2019 0922   MCHC 34.9 08/09/2019 0922   RDW 12.5 08/09/2019 0922   RDW 14.2 12/22/2016 1005   LYMPHSABS 3,444 08/09/2019 0922   LYMPHSABS 2.7 12/22/2016 1005   MONOABS 0.7 06/18/2019 0935   EOSABS 90 08/09/2019 0922   EOSABS 0.1 12/22/2016 1005   BASOSABS 39 08/09/2019 0922   BASOSABS 0.0 12/22/2016 1005   Lab Results  Component Value Date   HGBA1C 5.7 (H) 06/17/2020    ASSESSMENT AND PLAN: 1. Mixed hyperlipidemia Continue Crestor.  He will stop at the lab today to have baseline blood test done including lipid profile. - rosuvastatin (CRESTOR) 20 MG tablet; TAKE 1 TABLET(20 MG) BY MOUTH DAILY. STOP PRAVASTATIN  Dispense: 90 tablet; Refill: 4  2. Obesity (BMI  30.0-34.9) Healthy eating habits discussed and encouraged.  Encouraged him to cut back on portion sizes of white carbohydrates, try to incorporate fresh fruits and vegetables into his diet daily, stay away from sugary drinks as much as  he can.  Encouraged him to getting some exercise with goal of 3 to 5 days a week for about 30 minutes.  He can try alternate forms of exercise like swimming or riding a bike if his right shin is bothersome to him. - CBC - Comprehensive metabolic panel - Lipid panel - Hemoglobin A1c  3. Prediabetes - Hemoglobin A1c  4. Tobacco dependence Advised to quit.  Patient not ready to give a trial of quitting.  5. Influenza vaccination declined  6. Bipolar 1 Currently being managed by behavioral health.  Patient was given the opportunity to ask questions.  Patient verbalized understanding of the plan and was able to repeat key elements of the plan.   No orders of the defined types were placed in this encounter.    Requested Prescriptions    No prescriptions requested or ordered in this encounter    No follow-ups on file.  Karle Plumber, MD, FACP

## 2021-06-11 NOTE — Patient Instructions (Signed)
Healthy Eating °Following a healthy eating pattern may help you to achieve and maintain a healthy body weight, reduce the risk of chronic disease, and live a long and productive life. It is important to follow a healthy eating pattern at an appropriate calorie level for your body. Your nutritional needs should be met primarily through food by choosing a variety of nutrient-rich foods. °What are tips for following this plan? °Reading food labels °Read labels and choose the following: °Reduced or low sodium. °Juices with 100% fruit juice. °Foods with low saturated fats and high polyunsaturated and monounsaturated fats. °Foods with whole grains, such as whole wheat, cracked wheat, brown rice, and wild rice. °Whole grains that are fortified with folic acid. This is recommended for women who are pregnant or who want to become pregnant. °Read labels and avoid the following: °Foods with a lot of added sugars. These include foods that contain brown sugar, corn sweetener, corn syrup, dextrose, fructose, glucose, high-fructose corn syrup, honey, invert sugar, lactose, malt syrup, maltose, molasses, raw sugar, sucrose, trehalose, or turbinado sugar. °Do not eat more than the following amounts of added sugar per day: °6 teaspoons (25 g) for women. °9 teaspoons (38 g) for men. °Foods that contain processed or refined starches and grains. °Refined grain products, such as white flour, degermed cornmeal, white bread, and white rice. °Shopping °Choose nutrient-rich snacks, such as vegetables, whole fruits, and nuts. Avoid high-calorie and high-sugar snacks, such as potato chips, fruit snacks, and candy. °Use oil-based dressings and spreads on foods instead of solid fats such as butter, stick margarine, or cream cheese. °Limit pre-made sauces, mixes, and "instant" products such as flavored rice, instant noodles, and ready-made pasta. °Try more plant-protein sources, such as tofu, tempeh, black beans, edamame, lentils, nuts, and  seeds. °Explore eating plans such as the Mediterranean diet or vegetarian diet. °Cooking °Use oil to sauté or stir-fry foods instead of solid fats such as butter, stick margarine, or lard. °Try baking, boiling, grilling, or broiling instead of frying. °Remove the fatty part of meats before cooking. °Steam vegetables in water or broth. °Meal planning ° °At meals, imagine dividing your plate into fourths: °One-half of your plate is fruits and vegetables. °One-fourth of your plate is whole grains. °One-fourth of your plate is protein, especially lean meats, poultry, eggs, tofu, beans, or nuts. °Include low-fat dairy as part of your daily diet. °Lifestyle °Choose healthy options in all settings, including home, work, school, restaurants, or stores. °Prepare your food safely: °Wash your hands after handling raw meats. °Keep food preparation surfaces clean by regularly washing with hot, soapy water. °Keep raw meats separate from ready-to-eat foods, such as fruits and vegetables. °Cook seafood, meat, poultry, and eggs to the recommended internal temperature. °Store foods at safe temperatures. In general: °Keep cold foods at 40°F (4.4°C) or below. °Keep hot foods at 140°F (60°C) or above. °Keep your freezer at 0°F (-17.8°C) or below. °Foods are no longer safe to eat when they have been between the temperatures of 40°-140°F (4.4-60°C) for more than 2 hours. °What foods should I eat? °Fruits °Aim to eat 2 cup-equivalents of fresh, canned (in natural juice), or frozen fruits each day. Examples of 1 cup-equivalent of fruit include 1 small apple, 8 large strawberries, 1 cup canned fruit, ½ cup dried fruit, or 1 cup 100% juice. °Vegetables °Aim to eat 2½-3 cup-equivalents of fresh and frozen vegetables each day, including different varieties and colors. Examples of 1 cup-equivalent of vegetables include 2 medium carrots, 2 cups raw,   leafy greens, 1 cup chopped vegetable (raw or cooked), or 1 medium baked potato. °Grains °Aim to  eat 6 ounce-equivalents of whole grains each day. Examples of 1 ounce-equivalent of grains include 1 slice of bread, 1 cup ready-to-eat cereal, 3 cups popcorn, or ½ cup cooked rice, pasta, or cereal. °Meats and other proteins °Aim to eat 5-6 ounce-equivalents of protein each day. Examples of 1 ounce-equivalent of protein include 1 egg, 1/2 cup nuts or seeds, or 1 tablespoon (16 g) peanut butter. A cut of meat or fish that is the size of a deck of cards is about 3-4 ounce-equivalents. °Of the protein you eat each week, try to have at least 8 ounces come from seafood. This includes salmon, trout, herring, and anchovies. °Dairy °Aim to eat 3 cup-equivalents of fat-free or low-fat dairy each day. Examples of 1 cup-equivalent of dairy include 1 cup (240 mL) milk, 8 ounces (250 g) yogurt, 1½ ounces (44 g) natural cheese, or 1 cup (240 mL) fortified soy milk. °Fats and oils °Aim for about 5 teaspoons (21 g) per day. Choose monounsaturated fats, such as canola and olive oils, avocados, peanut butter, and most nuts, or polyunsaturated fats, such as sunflower, corn, and soybean oils, walnuts, pine nuts, sesame seeds, sunflower seeds, and flaxseed. °Beverages °Aim for six 8-oz glasses of water per day. Limit coffee to three to five 8-oz cups per day. °Limit caffeinated beverages that have added calories, such as soda and energy drinks. °Limit alcohol intake to no more than 1 drink a day for nonpregnant women and 2 drinks a day for men. One drink equals 12 oz of beer (355 mL), 5 oz of wine (148 mL), or 1½ oz of hard liquor (44 mL). °Seasoning and other foods °Avoid adding excess amounts of salt to your foods. Try flavoring foods with herbs and spices instead of salt. °Avoid adding sugar to foods. °Try using oil-based dressings, sauces, and spreads instead of solid fats. °This information is based on general [place] nutrition guidelines. For more information, visit choosemyplate.gov. Exact amounts may vary based on your nutrition  needs. °Summary °A healthy eating plan may help you to maintain a healthy weight, reduce the risk of chronic diseases, and stay active throughout your life. °Plan your meals. Make sure you eat the right portions of a variety of nutrient-rich foods. °Try baking, boiling, grilling, or broiling instead of frying. °Choose healthy options in all settings, including home, work, school, restaurants, or stores. °This information is not intended to replace advice given to you by your health care provider. Make sure you discuss any questions you have with your health care provider. °Document Revised: 02/10/2021 Document Reviewed: 02/10/2021 °Elsevier Patient Education © 2022 [employer] ° °

## 2021-06-12 ENCOUNTER — Other Ambulatory Visit: Payer: Self-pay | Admitting: Internal Medicine

## 2021-06-12 LAB — COMPREHENSIVE METABOLIC PANEL WITH GFR
ALT: 24 IU/L (ref 0–44)
AST: 27 IU/L (ref 0–40)
Albumin/Globulin Ratio: 1.9 (ref 1.2–2.2)
Albumin: 5 g/dL (ref 4.0–5.0)
Alkaline Phosphatase: 107 IU/L (ref 44–121)
BUN/Creatinine Ratio: 7 — ABNORMAL LOW (ref 9–20)
BUN: 9 mg/dL (ref 6–24)
Bilirubin Total: 0.2 mg/dL (ref 0.0–1.2)
CO2: 23 mmol/L (ref 20–29)
Calcium: 9.7 mg/dL (ref 8.7–10.2)
Chloride: 97 mmol/L (ref 96–106)
Creatinine, Ser: 1.3 mg/dL — ABNORMAL HIGH (ref 0.76–1.27)
Globulin, Total: 2.7 g/dL (ref 1.5–4.5)
Glucose: 73 mg/dL (ref 70–99)
Potassium: 4 mmol/L (ref 3.5–5.2)
Sodium: 136 mmol/L (ref 134–144)
Total Protein: 7.7 g/dL (ref 6.0–8.5)
eGFR: 68 mL/min/1.73

## 2021-06-12 LAB — CBC
Hematocrit: 42.3 % (ref 37.5–51.0)
Hemoglobin: 15.2 g/dL (ref 13.0–17.7)
MCH: 31.8 pg (ref 26.6–33.0)
MCHC: 35.9 g/dL — ABNORMAL HIGH (ref 31.5–35.7)
MCV: 89 fL (ref 79–97)
Platelets: 231 x10E3/uL (ref 150–450)
RBC: 4.78 x10E6/uL (ref 4.14–5.80)
RDW: 13.1 % (ref 11.6–15.4)
WBC: 9.2 x10E3/uL (ref 3.4–10.8)

## 2021-06-12 LAB — LIPID PANEL
Chol/HDL Ratio: 3.2 ratio (ref 0.0–5.0)
Cholesterol, Total: 198 mg/dL (ref 100–199)
HDL: 61 mg/dL
LDL Chol Calc (NIH): 110 mg/dL — ABNORMAL HIGH (ref 0–99)
Triglycerides: 158 mg/dL — ABNORMAL HIGH (ref 0–149)
VLDL Cholesterol Cal: 27 mg/dL (ref 5–40)

## 2021-06-12 LAB — HEMOGLOBIN A1C
Est. average glucose Bld gHb Est-mCnc: 114 mg/dL
Hgb A1c MFr Bld: 5.6 % (ref 4.8–5.6)

## 2021-06-12 NOTE — Progress Notes (Signed)
Blood cell counts are normal.  Liver function test normal.  Cholesterol level is 110 with goal being less than 100.  Make sure that he is taking the rosuvastatin daily as prescribed.  Kidney function not at but stable compared to a year ago.  Make sure that he is drinking several glasses of water daily.  Avoid long-term use of over-the-counter medications like ibuprofen, Aleve, Motrin as long-term use can adversely affect the kidneys.

## 2021-07-10 ENCOUNTER — Other Ambulatory Visit: Payer: Self-pay | Admitting: Internal Medicine

## 2021-07-10 DIAGNOSIS — B20 Human immunodeficiency virus [HIV] disease: Secondary | ICD-10-CM

## 2021-07-21 ENCOUNTER — Encounter: Payer: Self-pay | Admitting: Internal Medicine

## 2021-07-21 ENCOUNTER — Other Ambulatory Visit: Payer: Self-pay

## 2021-07-21 ENCOUNTER — Ambulatory Visit (INDEPENDENT_AMBULATORY_CARE_PROVIDER_SITE_OTHER): Payer: Self-pay | Admitting: Internal Medicine

## 2021-07-21 VITALS — BP 119/79 | HR 87 | Temp 98.5°F | Ht 68.0 in | Wt 236.0 lb

## 2021-07-21 DIAGNOSIS — B20 Human immunodeficiency virus [HIV] disease: Secondary | ICD-10-CM

## 2021-07-21 DIAGNOSIS — Z113 Encounter for screening for infections with a predominantly sexual mode of transmission: Secondary | ICD-10-CM

## 2021-07-21 DIAGNOSIS — Z23 Encounter for immunization: Secondary | ICD-10-CM | POA: Insufficient documentation

## 2021-07-21 MED ORDER — DARUNAVIR 800 MG PO TABS: 800.0000 mg | ORAL_TABLET | Freq: Every day | ORAL | 11 refills | Status: DC

## 2021-07-21 MED ORDER — GENVOYA 150-150-200-10 MG PO TABS: ORAL_TABLET | ORAL | 11 refills | Status: DC

## 2021-07-21 NOTE — Assessment & Plan Note (Signed)
Discussed [id number] and given today

## 2021-07-21 NOTE — Assessment & Plan Note (Signed)
Will screen today.  No risk factors by history.

## 2021-07-21 NOTE — Addendum Note (Signed)
Addended by: Adelfa Koh on: 07/21/2021 10:35 AM   Modules accepted: Orders

## 2021-07-21 NOTE — Progress Notes (Signed)
° °  Subjective:    Patient ID: Shawn Brooks, male    DOB: [birth year], 46 y.o.   MRN: [id number]  HPI Here for follow up of HIV He continues on Genvoya and Prezista for his salvage regimen with a history of persistent low-level viremia.  He has no issues with getting, taking or tolerating the medications.  Uses condoms with sexual activity.     Review of Systems  Constitutional:  Negative for fever and unexpected weight change.  Gastrointestinal:  Negative for diarrhea and nausea.  Skin:  Negative for rash.      Objective:   Physical Exam Eyes:     General: No scleral icterus. Pulmonary:     Effort: Pulmonary effort is normal.  Skin:    Findings: No rash.  Neurological:     General: No focal deficit present.     Mental Status: He is alert.  Psychiatric:        Mood and Affect: Mood normal.   SH: + tobacco       Assessment & Plan:

## 2021-07-21 NOTE — Assessment & Plan Note (Signed)
He continues to do well, no concerns and will continue with Genvoya and prezista with good suppression.  We can consider a trial of Biktarvy alone at some point since he has no actual underlying resistance mutations identified.

## 2021-07-22 LAB — T-HELPER CELL (CD4) - (RCID CLINIC ONLY)
CD4 % Helper T Cell: 35 % (ref 33–65)
CD4 T Cell Abs: 768 /uL (ref 400–1790)

## 2021-07-22 LAB — URINE CYTOLOGY ANCILLARY ONLY
Chlamydia: NEGATIVE
Comment: NEGATIVE
Comment: NORMAL
Neisseria Gonorrhea: NEGATIVE

## 2021-07-23 LAB — HIV-1 RNA QUANT-NO REFLEX-BLD
HIV 1 RNA Quant: <20 Copies/mL — ABNORMAL HIGH
HIV-1 RNA Quant, Log: <1.3 Log cps/mL — ABNORMAL HIGH

## 2021-07-23 LAB — RPR: RPR Ser Ql: NONREACTIVE

## 2021-07-30 ENCOUNTER — Other Ambulatory Visit: Payer: Self-pay | Admitting: Internal Medicine

## 2021-07-30 MED ORDER — OXYBUTYNIN CHLORIDE ER 10 MG PO TB24
10.0000 mg | ORAL_TABLET | Freq: Every day | ORAL | 4 refills | Status: DC
  Filled 2021-07-30: qty 30, 30d supply, fill #0

## 2021-07-31 ENCOUNTER — Other Ambulatory Visit (HOSPITAL_COMMUNITY): Payer: Self-pay

## 2021-09-08 ENCOUNTER — Other Ambulatory Visit: Payer: Self-pay | Admitting: Internal Medicine

## 2021-10-05 ENCOUNTER — Other Ambulatory Visit (HOSPITAL_COMMUNITY): Payer: Self-pay | Admitting: Physician Assistant

## 2021-10-05 DIAGNOSIS — F319 Bipolar disorder, unspecified: Secondary | ICD-10-CM

## 2021-10-07 ENCOUNTER — Telehealth (HOSPITAL_COMMUNITY): Payer: Self-pay | Admitting: *Deleted

## 2021-10-07 NOTE — Telephone Encounter (Signed)
Call from patients specialty pharmacy requesting new rxs for his three medicines we give him. Reviewed the chart, he should have been out last month. Last note with provider indicated he was to return in 6 months which will be May but there is no appt in chart for the future.  ?

## 2021-10-09 ENCOUNTER — Ambulatory Visit: Payer: Self-pay

## 2021-10-09 NOTE — Telephone Encounter (Signed)
?  Chief Complaint: Vomiting for 1.5 weeks ?Symptoms: dry heaves - vomiting into mouth a white milky substance ?Frequency: intermittent a few times a day ?Pertinent Negatives: Patient denies fever, dehydration ?Disposition: '[]'$ ED /'[x]'$ Urgent Care (no appt availability in office) / '[]'$ Appointment(In office/virtual)/ '[]'$  [facility]/ '[]'$ Home Care/ '[]'$ Refused Recommended Disposition /'[]'$ [facility]/ '[]'$  Follow-up with PCP ?Additional Notes: Pt has been dry heaving - and throwing up a small amount into his mouth for the past 1.5 weeks. Pt has been able to keep food down . No appetite. Today had 1 incidence of diarrhea. Also today had an instance of true vomiting. Pt unwilling to go to UC. Pt will go to ED for treatment. ? ? ?Summary: PAtient dry heaving a week then started vomiting  ? Patient called in  to get an appointment with his provider since he have been dry heaving and then started vomiting started about a week ago. Patient advised nurse would call just to get more details and direct him accordingly. Scheduled with Cari Mayers 10/13/21. Can be reached at Ph#  [phone]   ?  ? ?Reason for Disposition ? [1] MILD or MODERATE vomiting AND [2] present > 48 hours (2 days) (Exception: mild vomiting with associated diarrhea) ? ?Answer Assessment - Initial Assessment Questions ?1. VOMITING SEVERITY: "How many times have you vomited in the past 24 hours?"  ?   - MILD:  1 - 2 times/day ?   - MODERATE: 3 - 5 times/day, decreased oral intake without significant weight loss or symptoms of dehydration ?   - SEVERE: 6 or more times/day, vomits everything or nearly everything, with significant weight loss, symptoms of dehydration  ?    2 times -  ?2. ONSET: "When did the vomiting begin?"  ?    A week and 1/2 ago ?3. FLUIDS: "What fluids or food have you vomited up today?" "Have you been able to keep any fluids down?" ?    Is eating - no appetite ?4. ABDOMINAL PAIN: "Are your having any abdominal pain?" If yes :  "How bad is it and what does it feel like?" (e.g., crampy, dull, intermittent, constant)  ?    Yes - intermittent  4-5/10, dull and sharp ?5. DIARRHEA: "Is there any diarrhea?" If Yes, ask: "How many times today?"  ?    Yes - 1 time today ?6. CONTACTS: "Is there anyone else in the family with the same symptoms?"  ?    no ?7. CAUSE: "What do you think is causing your vomiting?" ?    No clue ?8. HYDRATION STATUS: "Any signs of dehydration?" (e.g., dry mouth [not only dry lips], too weak to stand) "When did you last urinate?" ?    no ?9. OTHER SYMPTOMS: "Do you have any other symptoms?" (e.g., fever, headache, vertigo, vomiting blood or coffee grounds, recent head injury) ?    no ?10. PREGNANCY: "Is there any chance you are pregnant?" "When was your last menstrual period?" ?      na ? ?Protocols used: Vomiting-A-AH ? ?

## 2021-10-13 ENCOUNTER — Ambulatory Visit (INDEPENDENT_AMBULATORY_CARE_PROVIDER_SITE_OTHER): Payer: Managed Care, Other (non HMO) | Admitting: Physician Assistant

## 2021-10-13 ENCOUNTER — Encounter: Payer: Self-pay | Admitting: Physician Assistant

## 2021-10-13 VITALS — BP 131/89 | HR 86 | Temp 98.9°F | Resp 18 | Ht 68.0 in | Wt 230.0 lb

## 2021-10-13 DIAGNOSIS — K219 Gastro-esophageal reflux disease without esophagitis: Secondary | ICD-10-CM | POA: Diagnosis not present

## 2021-10-13 DIAGNOSIS — Z8719 Personal history of other diseases of the digestive system: Secondary | ICD-10-CM

## 2021-10-13 DIAGNOSIS — R112 Nausea with vomiting, unspecified: Secondary | ICD-10-CM | POA: Diagnosis not present

## 2021-10-13 MED ORDER — FAMOTIDINE 40 MG PO TABS: 40.0000 mg | ORAL_TABLET | Freq: Two times a day (BID) | ORAL | 0 refills | Status: DC

## 2021-10-13 MED ORDER — ONDANSETRON 4 MG PO TBDP
4.0000 mg | ORAL_TABLET | Freq: Once | ORAL | Status: AC
  Administered 2021-10-13: 4 mg via ORAL

## 2021-10-13 MED ORDER — ONDANSETRON 8 MG PO TBDP: 8.0000 mg | ORAL_TABLET | Freq: Three times a day (TID) | ORAL | 0 refills | Status: DC | PRN

## 2021-10-13 NOTE — Progress Notes (Signed)
? ?Established Patient Office Visit ? ?Subjective   ?Patient ID: Shawn Brooks, male    DOB: Mar 10, 1976  Age: 46 y.o. MRN: [id number] ? ?Chief Complaint  ?Patient presents with  ? Nausea  ? ? ?States that he has been having constant nausea with dry heaving for the past 2 weeks.  States that he has been able to keep some food down and has been able to drink some water. ? ?States that he has had several episodes of diarrhea, states that this however has been going on for several months, states that he generally has them twice a week.  States his last bowel movement was on Sunday and was normal soft formed.  He does believe he has not had as many bowel movements due to his low oral intake. ? ?States he has not started any new medications, has not been eating any different foods, does feel his stress level is good. ? ?Does endorse that he has a history of heartburn and acid reflux, and states that he did stop his Pepcid about a month ago due to it not being refilled for him. ? ? ?Past Medical History:  ?Diagnosis Date  ? Anal condyloma   ? Anxiety   ? Blood dyscrasia   ? HIV  ? Chronic back pain   ? Depression   ? DJD (degenerative joint disease)   ? Gastric ulcer   ? GERD (gastroesophageal reflux disease)   ? Headache(784.0)   ? Hiatal hernia   ? HIV infection ([place])   ? Hyperplastic colon polyp   ? Hypertension   ? Internal hemorrhoids   ? ?Social History  ? ?Socioeconomic History  ? Marital status: Divorced  ?  Spouse name: Not on file  ? Number of children: 1  ? Years of education: Not on file  ? Highest education level: Not on file  ?Occupational History  ? Occupation: Disabled  ?Tobacco Use  ? Smoking status: Every Day  ?  Packs/day: 1.00  ?  Types: Cigarettes  ? Smokeless tobacco: Never  ? Tobacco comments:  ?  not ready to quit  ?Vaping Use  ? Vaping Use: Never used  ?Substance and Sexual Activity  ? Alcohol use: Yes  ?  Comment: occasionally  ? Drug use: Yes  ?  Frequency: 7.0 times per week  ?  Types:  Marijuana  ?  Comment: daily  ? Sexual activity: Not Currently  ?  Partners: Male  ?  Comment: declined condoms  ?Other Topics Concern  ? Not on file  ?Social History Narrative  ? Not on file  ? ?Social Determinants of Health  ? ?Financial Resource Strain: Not on file  ?Food Insecurity: Not on file  ?Transportation Needs: Not on file  ?Physical Activity: Not on file  ?Stress: Not on file  ?Social Connections: Not on file  ?Intimate Partner Violence: Not on file  ? ?Family History  ?Problem Relation Age of Onset  ? Cancer Mother   ?     laryngeal  ? Pneumonia Father   ? Diabetes Father   ? Hypertension Sister   ? Crohn's disease Brother   ? Crohn's disease Maternal Grandmother   ? Cancer Maternal Grandmother   ?     patient unsure of type  ? Colon cancer Maternal Grandfather   ? ?Allergies  ?Allergen Reactions  ? Chantix [Varenicline] Other (See Comments)  ?  Bad dreams  ? Oxycodone Itching  ? ?  ? ?Review of Systems  ?Constitutional:  Negative for fever and weight loss.  ?HENT: Negative.    ?Eyes: Negative.   ?Respiratory:  Negative for shortness of breath.   ?Cardiovascular:  Negative for chest pain.  ?Gastrointestinal:  Positive for diarrhea, nausea and vomiting. Negative for abdominal pain, blood in stool and heartburn.  ?Genitourinary: Negative.   ?Musculoskeletal: Negative.   ?Skin: Negative.   ?Neurological:  Negative for dizziness and headaches.  ?Endo/Heme/Allergies: Negative.   ?Psychiatric/Behavioral:  Negative for depression. The patient is not nervous/anxious.   ? ?  ?Objective:  ?  ? ?BP 131/89 (BP Location: Right Arm, Patient Position: Sitting, Cuff Size: Normal)   Pulse 86   Temp 98.9 ?F (37.2 ?C) (Oral)   Resp 18   Ht '5\' 8"'$  (1.727 m)   Wt 230 lb (104.3 kg)   SpO2 97%   BMI 34.97 kg/m?  ?  ? ?Physical Exam ?Vitals and nursing note reviewed.  ?Constitutional:   ?   Appearance: Normal appearance.  ?HENT:  ?   Head: Normocephalic and atraumatic.  ?   Right Ear: External ear normal.  ?   Left Ear:  External ear normal.  ?   Nose: Nose normal.  ?   Mouth/Throat:  ?   Mouth: Mucous membranes are moist.  ?   Pharynx: Oropharynx is clear.  ?Eyes:  ?   Extraocular Movements: Extraocular movements intact.  ?   Conjunctiva/sclera: Conjunctivae normal.  ?   Pupils: Pupils are equal, round, and reactive to light.  ?Cardiovascular:  ?   Rate and Rhythm: Normal rate and regular rhythm.  ?   Pulses: Normal pulses.  ?   Heart sounds: Normal heart sounds.  ?Pulmonary:  ?   Effort: Pulmonary effort is normal.  ?   Breath sounds: Normal breath sounds.  ?Abdominal:  ?   General: Abdomen is flat.  ?   Tenderness: There is no abdominal tenderness.  ?Musculoskeletal:     ?   General: Normal range of motion.  ?   Cervical back: Normal range of motion and neck supple.  ?Skin: ?   General: Skin is warm and dry.  ?Neurological:  ?   General: No focal deficit present.  ?   Mental Status: He is alert and oriented to person, place, and time.  ?Psychiatric:     ?   Mood and Affect: Mood normal.     ?   Behavior: Behavior normal.     ?   Thought Content: Thought content normal.     ?   Judgment: Judgment normal.  ? ? ? ?  ?Assessment & Plan:  ? ?Problem List Items Addressed This Visit   ? ?  ? Other  ? H/O gastrointestinal disease  ? ?Other Visit Diagnoses   ? ? Nausea and vomiting, unspecified vomiting type    -  Primary  ? Relevant Medications  ? ondansetron (ZOFRAN-ODT) 8 MG disintegrating tablet  ? ondansetron (ZOFRAN-ODT) disintegrating tablet 4 mg (Completed)  ? Other Relevant Orders  ? CBC with Differential/Platelet  ? H Pylori, IGM, IGG, IGA AB  ? Basic Metabolic Panel  ? Gastroesophageal reflux disease without esophagitis      ? Relevant Medications  ? ondansetron (ZOFRAN-ODT) 8 MG disintegrating tablet  ? famotidine (PEPCID) 40 MG tablet  ? ondansetron (ZOFRAN-ODT) disintegrating tablet 4 mg (Completed)  ? ?  ?1. Nausea and vomiting, unspecified vomiting type ?Patient given dose of Zofran in clinic.  Patient education given on  supportive care, encouraged brat diet.  Restart  Pepcid.  Red flags given for prompt reevaluation ?- CBC with Differential/Platelet ?- H Pylori, IGM, IGG, IGA AB ?- Basic Metabolic Panel ?- ondansetron (ZOFRAN-ODT) 8 MG disintegrating tablet; Take 1 tablet (8 mg total) by mouth every 8 (eight) hours as needed for nausea or vomiting.  Dispense: 20 tablet; Refill: 0 ?- ondansetron (ZOFRAN-ODT) disintegrating tablet 4 mg ? ?2. Gastroesophageal reflux disease without esophagitis ? ?- famotidine (PEPCID) 40 MG tablet; Take 1 tablet (40 mg total) by mouth 2 (two) times daily.  Dispense: 180 tablet; Refill: 0 ? ? ?I have reviewed the patient's medical history (PMH, PSH, Social History, Family History, Medications, and allergies) , and have been updated if relevant. I spent 30 minutes reviewing chart and  face to face time with patient. ? ? ?Return if symptoms worsen or fail to improve.  ? ? ?Torre [place] S Mayers, PA-C ? ?

## 2021-10-13 NOTE — Progress Notes (Signed)
Patient takes medication at night. ?Patient has no appetite. Patient was able to tolerate 2 water bottles today. ?Patient has been dry heaving - and throwing up a small amount into his mouth for the past 1.5 weeks.  ?10/09/21 he had 1 incidence of diarrhea. Also on 4/14 he had an instance of true vomiting. ?

## 2021-10-13 NOTE — Patient Instructions (Signed)
I sent a refill of your Pepcid, I do encourage you to restart that medication at this time.  I also sent a prescription for Zofran to help you with the nausea.  I encourage you to eat a gentle diet and advance as tolerated.  Make sure that you are staying as hydrated as possible. ? ?I encourage you to use a stool softener 1-2 times a day until your hemorrhoid symptoms improve. ? ?We will call you with today's lab results. ? ?Kennieth Rad, PA-C ?Physician Assistant ?[facility] ?[email or web address] ? ? ?Nausea, Adult ?Nausea is the feeling of having an upset stomach or that you are about to vomit. Nausea on its own is not usually a serious concern, but it may be an early sign of a more serious medical problem. As nausea gets worse, it can lead to vomiting. If vomiting develops, or if you are not able to drink enough fluids, you are at risk of becoming dehydrated. ?Dehydration can make you tired and thirsty, cause you to have a dry mouth, and decrease how often you urinate. Older adults and people with other diseases or a weak disease-fighting system (immune system) are at higher risk for dehydration. The main goals of treating your nausea are: ?To relieve your nausea. ?To limit repeated nausea episodes. ?To prevent vomiting and dehydration. ?Follow these instructions at home: ?Watch your symptoms for any changes. Tell your health care provider about them. ?Eating and drinking ? ?  ? ?Take an oral rehydration solution (ORS). This is a drink that is sold at pharmacies and retail stores. ?Drink clear fluids slowly and in small amounts as you are able. Clear fluids include water, ice chips, low-calorie sports drinks, and fruit juice that has water added (diluted fruit juice). ?Eat bland, easy-to-digest foods in small amounts as you are able. These foods include bananas, applesauce, rice, lean meats, toast, and crackers. ?Avoid drinking fluids that contain a lot of  sugar or caffeine, such as energy drinks, sports drinks, and soda. ?Avoid alcohol. ?Avoid spicy or fatty foods. ?General instructions ?Take over-the-counter and prescription medicines only as told by your health care provider. ?Rest at home while you recover. ?Drink enough fluid to keep your urine pale yellow. ?Breathe slowly and deeply when you feel nauseous. ?Avoid smelling things that have strong odors. ?Wash your hands often using soap and water for at least 20 seconds. If soap and water are not available, use hand sanitizer. ?Make sure that everyone in your household washes their hands well and often. ?Keep all follow-up visits. This is important. ?Contact a health care provider if: ?Your nausea gets worse. ?Your nausea does not go away after two days. ?You vomit multiple times. ?You cannot drink fluids without vomiting. ?You have any of the following: ?New symptoms. ?A fever. ?A headache. ?Muscle cramps. ?A rash. ?Pain while urinating. ?You feel light-headed or dizzy. ?Get help right away if: ?You have pain in your chest, neck, arm, or jaw. ?You feel extremely weak or you faint. ?You have vomit that is bright red or looks like coffee grounds. ?You have bloody or black stools (feces) or stools that look like tar. ?You have a severe headache, a stiff neck, or both. ?You have severe pain, cramping, or bloating in your abdomen. ?You have difficulty breathing or are breathing very quickly. ?Your heart is beating very quickly. ?Your skin feels cold and clammy. ?You feel confused. ?You have signs of dehydration, such as: ?Dark urine, very little urine, or no urine. ?Cracked  lips. ?Dry mouth. ?Sunken eyes. ?Sleepiness. ?Weakness. ?These symptoms may be an emergency. Get help right away. Call 911. ?Do not wait to see if the symptoms will go away. ?Do not drive yourself to the hospital. ?Summary ?Nausea is the feeling that you have an upset stomach or that you are about to vomit. Nausea on its own is not usually a  serious concern, but it may be an early sign of a more serious medical problem. ?If vomiting develops, or if you are not able to drink enough fluids, you are at risk of becoming dehydrated. ?Follow recommendations for eating and drinking and take over-the-counter and prescription medicines only as told by your health care provider. ?Contact a health care provider right away if your symptoms worsen or you have new symptoms. ?Keep all follow-up visits. This is important. ?This information is not intended to replace advice given to you by your health care provider. Make sure you discuss any questions you have with your health care provider. ?Document Revised: 12/19/2020 Document Reviewed: 12/19/2020 ?Elsevier Patient Education ? [place]. ? ?

## 2021-10-14 LAB — BASIC METABOLIC PANEL
BUN/Creatinine Ratio: 7 — ABNORMAL LOW (ref 9–20)
BUN: 8 mg/dL (ref 6–24)
CO2: 20 mmol/L (ref 20–29)
Calcium: 9.7 mg/dL (ref 8.7–10.2)
Chloride: 97 mmol/L (ref 96–106)
Creatinine, Ser: 1.15 mg/dL (ref 0.76–1.27)
Glucose: 89 mg/dL (ref 70–99)
Potassium: 4.2 mmol/L (ref 3.5–5.2)
Sodium: 135 mmol/L (ref 134–144)
eGFR: 79 mL/min/{1.73_m2} (ref >59)

## 2021-10-14 LAB — H PYLORI, IGM, IGG, IGA AB
H pylori, IgM Abs: <9 units (ref 0.0–8.9)
H. pylori, IgA Abs: <9 units (ref 0.0–8.9)
H. pylori, IgG AbS: 0.16 Index Value (ref 0.00–0.79)

## 2021-10-14 LAB — CBC WITH DIFFERENTIAL/PLATELET
Basophils Absolute: 0 10*3/uL (ref 0.0–0.2)
Basos: 0 %
EOS (ABSOLUTE): 0 10*3/uL (ref 0.0–0.4)
Eos: 0 %
Hematocrit: 43.9 % (ref 37.5–51.0)
Hemoglobin: 15.5 g/dL (ref 13.0–17.7)
Immature Grans (Abs): 0 10*3/uL (ref 0.0–0.1)
Immature Granulocytes: 0 %
Lymphocytes Absolute: 2.3 10*3/uL (ref 0.7–3.1)
Lymphs: 24 %
MCH: 32.6 pg (ref 26.6–33.0)
MCHC: 35.3 g/dL (ref 31.5–35.7)
MCV: 92 fL (ref 79–97)
Monocytes Absolute: 0.5 10*3/uL (ref 0.1–0.9)
Monocytes: 5 %
Neutrophils Absolute: 6.8 10*3/uL (ref 1.4–7.0)
Neutrophils: 71 %
Platelets: 236 10*3/uL (ref 150–450)
RBC: 4.76 x10E6/uL (ref 4.14–5.80)
RDW: 13.8 % (ref 11.6–15.4)
WBC: 9.7 10*3/uL (ref 3.4–10.8)

## 2021-10-16 ENCOUNTER — Telehealth: Payer: Self-pay | Admitting: *Deleted

## 2021-10-16 NOTE — Telephone Encounter (Signed)
Patient verified DOB ?Patient is aware of results and needs to increase fluid intake to address dehydration and follow up as needed. ?

## 2021-10-16 NOTE — Telephone Encounter (Signed)
-----   Message from Kennieth Rad, PA-C sent at 10/15/2021  4:32 PM EDT ----- ?Please call patient and let him know that his kidney function is within normal limits, he does show signs of dehydration.  He was negative for H. pylori stomach infection, and he does not show signs of anemia, and his white blood cell count is within normal limits. ?

## 2021-11-02 ENCOUNTER — Telehealth: Payer: Self-pay | Admitting: Internal Medicine

## 2021-11-02 NOTE — Telephone Encounter (Signed)
We received call from patient seeking advise. Patient wants to know if there is an alternative to Preparation H? Patient wants something to relieve the pain. Please advise.  ?

## 2021-11-02 NOTE — Telephone Encounter (Signed)
Discussed with pt that he could try recticare otc for the hemorrhoid discomfort. Pt verbalized understanding. ?

## 2021-11-04 ENCOUNTER — Telehealth (INDEPENDENT_AMBULATORY_CARE_PROVIDER_SITE_OTHER): Payer: Commercial Managed Care - HMO | Admitting: Physician Assistant

## 2021-11-04 DIAGNOSIS — F319 Bipolar disorder, unspecified: Secondary | ICD-10-CM | POA: Diagnosis not present

## 2021-11-04 MED ORDER — SERTRALINE HCL 100 MG PO TABS: 200.0000 mg | ORAL_TABLET | Freq: Every day | ORAL | 1 refills | Status: DC

## 2021-11-04 MED ORDER — RAMELTEON 8 MG PO TABS: 8.0000 mg | ORAL_TABLET | Freq: Every day | ORAL | 1 refills | Status: DC

## 2021-11-04 MED ORDER — RISPERIDONE 4 MG PO TABS: 4.0000 mg | ORAL_TABLET | Freq: Every day | ORAL | 1 refills | Status: DC

## 2021-11-04 NOTE — Progress Notes (Signed)
BH MD/PA/NP OP Progress Note ? ?Virtual Visit via Telephone Note ? ?I connected with Shawn Brooks on 11/04/21 at  4:30 PM EDT by telephone and verified that I am speaking with the correct person using two identifiers. ? ?Location: ?Patient: Home ?Provider: Clinic ?  ?I discussed the limitations, risks, security and privacy concerns of performing an evaluation and management service by telephone and the availability of in person appointments. I also discussed with the patient that there may be a patient responsible charge related to this service. The patient expressed understanding and agreed to proceed. ? ?Follow Up Instructions: ?  ?I discussed the assessment and treatment plan with the patient. The patient was provided an opportunity to ask questions and all were answered. The patient agreed with the plan and demonstrated an understanding of the instructions. ?  ?The patient was advised to call back or seek an in-person evaluation if the symptoms worsen or if the condition fails to improve as anticipated. ? ?I provided 17 minutes of non-face-to-face time during this encounter. ? ?[name], PA  ? ? ?11/04/2021 4:48 PM ?Shawn Brooks  ?MRN:  [id number] ? ?Chief Complaint: No chief complaint on file. ? ?HPI:  ? ?Shawn Brooks is a 46 year old male with a past psychiatric history significant for bipolar disorder who presents to [facility] via virtual telephone visit for follow-up and medication management. Patient is currently being managed on the following medications: ? ?Trazodone 100 mg at bedtime ?Sertraline 150 mg daily ?Risperidone 3 mg at bedtime ? ?Patient presents to day endorsing depression. He reports that he experiences depression everyday characterized by the following symptoms: neglecting activities of daily living (showering) and eating less. Patient adds that his low mood has not been as bad. Patient denies any major events as triggers to his  depression. Patient further endorses lack of motivation, decreased energy, and difficulty concentration. Patient denies feelings of guilt/worthlessness and hopelessness. Patient also endorses anxiety accompanied by several small panic attacks. He reports that he recently experienced a big panic attack recently that kept him from working. Patient rates his anxiety a 6 or 7 out of 10. Patient denies any new stressors at this time. A PHQ-9 screen was performed with the patient scoring a 9. A GAD-7 screen was also performed with the patient scoring a 6. ? ?Patient is alert and oriented x4, calm, cooperative and fully engaged in conversation during the encounter. Patient endorses ok mood by expresses experiencing an underlying worrying/nagging feeling that some bad will happen. Patient denies suicidal or homicidal ideations. Patient further denies auditory or visual hallucinations and does not appear to be responding to internal/external stimuli. Patient endorses fair sleep and receives on average 6 - 7 hours of sleep each night. Patient reports that he occasionally doubles up on his dose of trazodone, but find little relief from his symptoms. Patient endorses decreased appetite and eats on average 1 meal per day. Patient endorses alcohol consumption everyday. Patient endorses tobacco use and smokes on average a 1/3 of a pack per day. Patient endorses illicit drug use in the form of marijuana. ? ?Visit Diagnosis: No diagnosis found. ? ?Past Psychiatric History:  ?Bipolar 1 disorder ? ?Past Medical History:  ?Past Medical History:  ?Diagnosis Date  ? Anal condyloma   ? Anxiety   ? Blood dyscrasia   ? HIV  ? Chronic back pain   ? Depression   ? DJD (degenerative joint disease)   ? Gastric ulcer   ?  GERD (gastroesophageal reflux disease)   ? Headache(784.0)   ? Hiatal hernia   ? HIV infection ([place])   ? Hyperplastic colon polyp   ? Hypertension   ? Internal hemorrhoids   ?  ?Past Surgical History:  ?Procedure Laterality Date   ? ESOPHAGOGASTRODUODENOSCOPY  03/27/2012  ? Procedure: ESOPHAGOGASTRODUODENOSCOPY (EGD);  Surgeon: Lear Ng, MD;  Location: [facility];  Service: Endoscopy;  Laterality: N/A;  ? IRRIGATION AND DEBRIDEMENT ABSCESS N/A 03/13/2015  ? Procedure: IRRIGATION AND DEBRIDEMENT ABSCESS;  Surgeon: Johnathan Hausen, MD;  Location: [facility];  Service: General;  Laterality: N/A;  ? WISDOM TOOTH EXTRACTION    ? ? ?Family Psychiatric History:  ?Denied ? ?Family History:  ?Family History  ?Problem Relation Age of Onset  ? Cancer Mother   ?     laryngeal  ? Pneumonia Father   ? Diabetes Father   ? Hypertension Sister   ? Crohn's disease Brother   ? Crohn's disease Maternal Grandmother   ? Cancer Maternal Grandmother   ?     patient unsure of type  ? Colon cancer Maternal Grandfather   ? ? ?Social History:  ?Social History  ? ?Socioeconomic History  ? Marital status: Divorced  ?  Spouse name: Not on file  ? Number of children: 1  ? Years of education: Not on file  ? Highest education level: Not on file  ?Occupational History  ? Occupation: Disabled  ?Tobacco Use  ? Smoking status: Every Day  ?  Packs/day: 1.00  ?  Types: Cigarettes  ? Smokeless tobacco: Never  ? Tobacco comments:  ?  not ready to quit  ?Vaping Use  ? Vaping Use: Never used  ?Substance and Sexual Activity  ? Alcohol use: Yes  ?  Comment: occasionally  ? Drug use: Yes  ?  Frequency: 7.0 times per week  ?  Types: Marijuana  ?  Comment: daily  ? Sexual activity: Not Currently  ?  Partners: Male  ?  Comment: declined condoms  ?Other Topics Concern  ? Not on file  ?Social History Narrative  ? Not on file  ? ?Social Determinants of Health  ? ?Financial Resource Strain: Not on file  ?Food Insecurity: Not on file  ?Transportation Needs: Not on file  ?Physical Activity: Not on file  ?Stress: Not on file  ?Social Connections: Not on file  ? ? ?Allergies:  ?Allergies  ?Allergen Reactions  ? Chantix [Varenicline] Other (See Comments)  ?  Bad dreams  ? Oxycodone Itching   ? ? ?Metabolic Disorder Labs: ?Lab Results  ?Component Value Date  ? HGBA1C 5.6 06/11/2021  ? MPG 117 (H) 11/16/2013  ? ?No results found for: PROLACTIN ?Lab Results  ?Component Value Date  ? CHOL 198 06/11/2021  ? TRIG 158 (H) 06/11/2021  ? HDL 61 06/11/2021  ? CHOLHDL 3.2 06/11/2021  ? VLDL 28 05/12/2016  ? LDLCALC 110 (H) 06/11/2021  ? LDLCALC 156 (H) 06/17/2020  ? ?Lab Results  ?Component Value Date  ? TSH 1.150 09/01/2017  ? TSH 1.080 01/27/2017  ? ? ?Therapeutic Level Labs: ?No results found for: LITHIUM ?No results found for: VALPROATE ?No components found for:  CBMZ ? ?Current Medications: ?Current Outpatient Medications  ?Medication Sig Dispense Refill  ? albuterol (VENTOLIN HFA) 108 (90 Base) MCG/ACT inhaler Inhale 2 puffs into the lungs every 6 (six) hours as needed for wheezing or shortness of breath. 18 g 1  ? darunavir (PREZISTA) 800 MG tablet Take 1 tablet (800 mg  total) by mouth daily. 30 tablet 11  ? elvitegravir-cobicistat-emtricitabine-tenofovir (GENVOYA) 150-150-200-10 MG TABS tablet Take one tab daily 30 tablet 11  ? famotidine (PEPCID) 40 MG tablet Take 1 tablet (40 mg total) by mouth 2 (two) times daily. 180 tablet 0  ? ondansetron (ZOFRAN-ODT) 8 MG disintegrating tablet Take 1 tablet (8 mg total) by mouth every 8 (eight) hours as needed for nausea or vomiting. 20 tablet 0  ? oxybutynin (DITROPAN-XL) 10 MG 24 hr tablet Take 1 tablet by mouth daily. 30 tablet 4  ? risperiDONE (RISPERDAL) 3 MG tablet TAKE 1 TABLET(3 MG) BY MOUTH AT BEDTIME 30 tablet 3  ? rosuvastatin (CRESTOR) 20 MG tablet TAKE 1 TABLET(20 MG) BY MOUTH DAILY. STOP PRAVASTATIN 90 tablet 4  ? sertraline (ZOLOFT) 100 MG tablet TAKE 1 AND 1/2 TABLETS(150 MG) BY MOUTH DAILY 45 tablet 3  ? traZODone (DESYREL) 100 MG tablet TAKE 1 TO 2 TABLETS BY MOUTH AT BEDTIME AS NEEDED FOR SLEEP 60 tablet 3  ? valACYclovir (VALTREX) 500 MG tablet TAKE 1 TABLET BY MOUTH TWICE DAILY 60 tablet 5  ? ?No current facility-administered medications for  this visit.  ? ? ? ?Musculoskeletal: ?Strength & Muscle Tone: Unable to assess due to telemedicine visit ?Gait & Station: Unable to assess due to telemedicine visit ?Patient leans: Unable to assess due to telem

## 2021-11-07 ENCOUNTER — Encounter (HOSPITAL_COMMUNITY): Payer: Self-pay | Admitting: Physician Assistant

## 2021-11-16 ENCOUNTER — Telehealth (HOSPITAL_COMMUNITY): Payer: Self-pay | Admitting: *Deleted

## 2021-11-16 NOTE — Telephone Encounter (Signed)
PATIENT CALLED LVM @ ADMIN DESK ASKING IF HE SHOULD STILL TAKE TRAZODONE?? I CALLED TO INFORM PATIENT THAT ACCORDING TO  PROVIDER THAT ON HIS 11/04/21 VISIT IT'S NOTED THAT: Patient was also recommended increasing his dosage of risperidone from 3 mg to 4 mg at bedtime for the management of his depression. Lastly, patient was recommended discontinuing his trazodone and being placed on ramelteon 8 mg at bedtime for the management of his sleep issues. Patient was agreeable to recommendations.  ** I REPEATED  TO PATIENT TO NOT TAKE THE TRAZODONE & HE STATED THAT HE HAD NOT BEEN TAKING IT HE JUST WANTED TO MAKE SURE.  I DID INFORM PATIENT THAT HE COULD TAKE MEDICATION BACK TO Rx  & OR POLICE DEPT &  PLACE INTO  THE  MEDICATION DROP BOX

## 2021-11-30 ENCOUNTER — Encounter: Payer: Self-pay | Admitting: Physician Assistant

## 2021-11-30 ENCOUNTER — Ambulatory Visit: Payer: Commercial Managed Care - HMO | Admitting: Physician Assistant

## 2021-11-30 VITALS — BP 102/70 | HR 96 | Ht 68.0 in | Wt 233.0 lb

## 2021-11-30 DIAGNOSIS — K648 Other hemorrhoids: Secondary | ICD-10-CM | POA: Diagnosis not present

## 2021-11-30 DIAGNOSIS — Z86010 Personal history of colonic polyps: Secondary | ICD-10-CM | POA: Diagnosis not present

## 2021-11-30 DIAGNOSIS — K625 Hemorrhage of anus and rectum: Secondary | ICD-10-CM

## 2021-11-30 MED ORDER — HYDROCORTISONE ACETATE 25 MG RE SUPP: 25.0000 mg | Freq: Every day | RECTAL | 4 refills | Status: AC

## 2021-11-30 NOTE — Progress Notes (Signed)
Subjective:    Patient ID: Shawn Brooks, male    DOB: 24-Mar-1976, 46 y.o.   MRN: [id number]  HPI  Shawn Brooks is a pleasant 46 year old African-American male, established with Dr. Hilarie Fredrickson.  He was last seen here in 2021, at that time with GERD symptoms and intermittent solid food dysphagia.  He had EGD that showed grade a esophagitis and had empiric dilation to 16 [place] without stricture, also noted to have mild gastritis. Patient has history of internal hemorrhoids and had undergone prior banding sessions x2 in 2019.  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 Johney Maine I believe in 2014.  He last had colonoscopy in 2019 with finding of small internal hemorrhoids, he had 3 small polyps removed which were tubular adenomas and is indicated for 5-year interval follow-up. Patient has history of HIV on treatment, bipolar disorder, seizure disorder, history of anal condyloma. Today he comes in stating that he had an episode about a month ago of significant internal anal pain similar to prior internal hemorrhoidal episodes.  He used Preparation H, Tucks pads, symptoms improved and then returned for 3 to 4 days and then resolved.  He says over the past 2 weeks he has been better and is not having any current hemorrhoidal symptoms.  He denies any problems with constipation or straining. He has had small amounts of bright red blood per rectum intermittently over the past couple of years with no recent change.  He is interested in pursuing hemorrhoidal banding.  Review of Systems.Pertinent positive and negative review of systems were noted in the above HPI section.  All other review of systems was otherwise negative.   Outpatient Encounter Medications as of 11/30/2021  Medication Sig   albuterol (VENTOLIN HFA) 108 (90 Base) MCG/ACT inhaler Inhale 2 puffs into the lungs every 6 (six) hours as needed for wheezing or shortness of breath.   darunavir (PREZISTA) 800 MG tablet Take 1  tablet (800 mg total) by mouth daily.   elvitegravir-cobicistat-emtricitabine-tenofovir (GENVOYA) 150-150-200-10 MG TABS tablet Take one tab daily   famotidine (PEPCID) 40 MG tablet Take 1 tablet (40 mg total) by mouth 2 (two) times daily.   hydrocortisone (ANUSOL-HC) 25 MG suppository Place 1 suppository (25 mg total) rectally at bedtime for 7 days.   ondansetron (ZOFRAN-ODT) 8 MG disintegrating tablet Take 1 tablet (8 mg total) by mouth every 8 (eight) hours as needed for nausea or vomiting.   oxybutynin (DITROPAN-XL) 10 MG 24 hr tablet Take 1 tablet by mouth daily.   ramelteon (ROZEREM) 8 MG tablet Take 1 tablet (8 mg total) by mouth at bedtime.   risperiDONE (RISPERDAL) 4 MG tablet Take 1 tablet (4 mg total) by mouth at bedtime.   rosuvastatin (CRESTOR) 20 MG tablet TAKE 1 TABLET(20 MG) BY MOUTH DAILY. STOP PRAVASTATIN   sertraline (ZOLOFT) 100 MG tablet Take 2 tablets (200 mg total) by mouth at bedtime.   traZODone (DESYREL) 100 MG tablet TAKE 1 TO 2 TABLETS BY MOUTH AT BEDTIME AS NEEDED FOR SLEEP   valACYclovir (VALTREX) 500 MG tablet TAKE 1 TABLET BY MOUTH TWICE DAILY   No facility-administered encounter medications on file as of 11/30/2021.   Allergies  Allergen Reactions   Chantix [Varenicline] Other (See Comments)    Bad dreams   Oxycodone Itching   Patient Active Problem List   Diagnosis Date Noted   Need for prophylactic vaccination against Streptococcus pneumoniae (pneumococcus) 07/21/2021   Influenza vaccine refused 06/17/2020  Esophageal dysphagia 06/17/2020   Mixed hyperlipidemia 06/17/2020   Obesity (BMI 30.0-34.9) 06/17/2020   History of farsightedness 06/17/2020   Bipolar 1 disorder ([place]) 05/01/2020   Chronic night sweats 04/10/2020   Counseled about COVID-19 virus infection 09/18/2019   Urinary frequency 12/26/2018   Medication monitoring encounter 08/08/2018   Memory problem 09/01/2017   Vitamin D deficiency 09/01/2017   Primary osteoarthritis of left knee  05/02/2017   Stage 3 chronic kidney disease ([place]) 05/02/2017   Prediabetes 12/22/2016   Tobacco abuse 05/31/2016   Lumbar transverse process fracture ([place]) 10/02/2015   Screening examination for sexually transmitted disease 04/04/2014   Seizures ([place]) 08/30/2013   Thoracic or lumbosacral neuritis or radiculitis 01/15/2013   H/O gastrointestinal disease 01/15/2013   Lumbar radiculopathy 01/15/2013   Carcinoma in situ of anal canal 11/17/2012   Condyloma acuminatum 11/17/2012   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   AGW (anogenital warts) 11/17/2012   Hemorrhoid 09/27/2012   BRBPR (bright red blood per rectum) 07/27/2012   Prostatitis 04/12/2012   Constipation 03/09/2012   Callus 08/16/2011   Ingrown toenail 08/16/2011   Chronic low back pain 08/16/2011   Dyslipidemia 09/03/2010   Herpes simplex virus (HSV) infection 07/06/2010   Erectile dysfunction 07/06/2010   ANXIETY DEPRESSION 05/06/2010   BURSITIS, KNEE 05/06/2010   Human immunodeficiency virus (HIV) disease ([place]) 04/16/2010   Social History   Socioeconomic History   Marital status: Divorced    Spouse name: Not on file   Number of children: 1   Years of education: Not on file   Highest education level: Not on file  Occupational History   Occupation: Disabled  Tobacco Use   Smoking status: Every Day    Packs/day: 1.00    Types: Cigarettes   Smokeless tobacco: Never   Tobacco comments:    not ready to quit  Vaping Use   Vaping Use: Never used  Substance and Sexual Activity   Alcohol use: Yes    Comment: occasionally   Drug use: Yes    Frequency: 7.0 times per week    Types: Marijuana    Comment: daily   Sexual activity: Not Currently    Partners: Male    Comment: declined condoms  Other Topics Concern   Not on file  Social History Narrative   Not on file   Social Determinants of Health   Financial Resource Strain: Not on file  Food Insecurity: Not on file  Transportation Needs:  Not on file  Physical Activity: Not on file  Stress: Not on file  Social Connections: Not on file  Intimate Partner Violence: Not on file    Shawn Brooks's family history includes Cancer in his maternal grandmother and mother; Colon cancer in his maternal grandfather; Crohn's disease in his brother and maternal grandmother; Diabetes in his father; Hypertension in his sister; Pneumonia in his father.      Objective:    Vitals:   11/30/21 0833  BP: 102/70  Pulse: 96    Physical Exam Well-developed well-nourished  AA male in no acute distress.   QASTMH,962  BMI 35  HEENT; nontraumatic normocephalic, EOMI, PE R LA, sclera anicteric. Oropharynx; not examined Neck; supple, no JVD Cardiovascular; regular rate and rhythm with S1-S2, no murmur rub or gallop  Abdomen; soft, nontender, nondistended, no palpable mass or hepatosplenomegaly, bowel sounds are active Rectal; small external hemorrhoids, noninflamed, on a anoscopy he has 1 moderate friable internal hemorrhoid Skin; benign exam, no jaundice rash or appreciable lesions  Extremities; no clubbing cyanosis or edema skin warm and dry Neuro/Psych; alert and oriented x4, grossly nonfocal mood and affect appropriate        Assessment & Plan:   #53 46 year old African-American male with recent episode of what sounds like a partially thrombosed internal hemorrhoid.  Patient has undergone prior hemorrhoidal banding x2 last in 2019. On anoscopy today he does have 1 friable moderate-sized internal hemorrhoid. Patient is interested in pursuing repeat hemorrhoidal banding, as recent symptoms were quite painful.  #2 history of adenomatous colon polyps-up-to-date with colonoscopy due for follow-up 2024 #3.  History of anal AIN-status post resection and ablation Dr. Isac Caddy #4 HIV on therapy 5.  Seizure disorder 6.  Bipolar disorder 7.  GERD, stable on twice daily Pepcid  Plan we discussed management of internal hemorrhoids, start Anusol  HC suppository nightly x7 days and then repeat course as needed-scription sent/4 refills Patient will be scheduled for hemorrhoidal banding with Dr. Hilarie Fredrickson Plan follow-up colonoscopy 2024   Betzayda Braxton S Anjani Feuerborn PA-C 11/30/2021   Cc: Ladell Pier, MD

## 2021-11-30 NOTE — Patient Instructions (Addendum)
If you are age 48 or younger, your body mass index should be between 19-25. Your Body mass index is 35.43 kg/m. If this is out of the aformentioned range listed, please consider follow up with your Primary Care Provider.  ________________________________________________________  The [facility] providers would like to encourage you to use [facility] to communicate with providers for non-urgent requests or questions.  Due to long hold times on the telephone, sending your provider a message by [facility] may be a faster and more efficient way to get a response.  Please allow 48 business hours for a response.  Please remember that this is for non-urgent requests.  _______________________________________________________  START Hydrocortisone Suppositories insert 1 suppository rectally at bedtime for 7 days then as needed.  You have been scheduled for a banding appointment with Dr. Hilarie Fredrickson on 01/06/2022 at 11:00 am  Thank you for entrusting me with your care and choosing [facility].  Amy Esterwood, PA-C

## 2021-12-04 NOTE — Progress Notes (Signed)
Addendum: Reviewed and agree with assessment and management plan. Kaevon Cotta M, MD  

## 2021-12-08 ENCOUNTER — Ambulatory Visit: Payer: 59 | Admitting: Internal Medicine

## 2021-12-25 ENCOUNTER — Encounter: Payer: Self-pay | Admitting: *Deleted

## 2021-12-26 ENCOUNTER — Other Ambulatory Visit: Payer: Self-pay | Admitting: Internal Medicine

## 2021-12-26 ENCOUNTER — Other Ambulatory Visit (HOSPITAL_COMMUNITY): Payer: Self-pay | Admitting: Physician Assistant

## 2021-12-26 ENCOUNTER — Other Ambulatory Visit: Payer: Self-pay | Admitting: Physician Assistant

## 2021-12-26 DIAGNOSIS — F319 Bipolar disorder, unspecified: Secondary | ICD-10-CM

## 2021-12-26 DIAGNOSIS — K219 Gastro-esophageal reflux disease without esophagitis: Secondary | ICD-10-CM

## 2021-12-26 DIAGNOSIS — B20 Human immunodeficiency virus [HIV] disease: Secondary | ICD-10-CM

## 2021-12-31 ENCOUNTER — Telehealth (HOSPITAL_COMMUNITY): Payer: Self-pay | Admitting: *Deleted

## 2021-12-31 ENCOUNTER — Other Ambulatory Visit (HOSPITAL_COMMUNITY): Payer: Self-pay | Admitting: Psychiatry

## 2021-12-31 DIAGNOSIS — F319 Bipolar disorder, unspecified: Secondary | ICD-10-CM

## 2021-12-31 MED ORDER — RAMELTEON 8 MG PO TABS: 8.0000 mg | ORAL_TABLET | Freq: Every day | ORAL | 3 refills | Status: DC

## 2021-12-31 NOTE — Telephone Encounter (Signed)
[facility] IS OUT OF OFFICE  SENDING Rx REQUEST TO Dr B PARSONS  Rx REFILL REQUEST: ramelteon (ROZEREM) 8 MG tablet Take 1 tablet (8 mg total) by mouth at bedtime

## 2021-12-31 NOTE — Telephone Encounter (Signed)
Medication refilled and sent to preferred pharmacy

## 2022-01-06 ENCOUNTER — Telehealth (HOSPITAL_COMMUNITY): Payer: Commercial Managed Care - HMO | Admitting: Physician Assistant

## 2022-01-06 ENCOUNTER — Encounter (HOSPITAL_COMMUNITY): Payer: Self-pay

## 2022-01-06 ENCOUNTER — Telehealth (HOSPITAL_COMMUNITY): Payer: Self-pay | Admitting: Psychiatry

## 2022-01-06 ENCOUNTER — Encounter: Payer: Commercial Managed Care - HMO | Admitting: Internal Medicine

## 2022-02-02 ENCOUNTER — Ambulatory Visit: Payer: Self-pay | Admitting: Internal Medicine

## 2022-02-02 ENCOUNTER — Ambulatory Visit: Payer: Self-pay

## 2022-02-10 ENCOUNTER — Ambulatory Visit: Payer: Self-pay

## 2022-02-10 ENCOUNTER — Ambulatory Visit: Payer: Self-pay | Admitting: Internal Medicine

## 2022-02-12 ENCOUNTER — Encounter: Payer: Self-pay | Admitting: Internal Medicine

## 2022-02-12 ENCOUNTER — Ambulatory Visit (INDEPENDENT_AMBULATORY_CARE_PROVIDER_SITE_OTHER): Payer: Commercial Managed Care - HMO | Admitting: Internal Medicine

## 2022-02-12 ENCOUNTER — Ambulatory Visit: Payer: Commercial Managed Care - HMO

## 2022-02-12 ENCOUNTER — Other Ambulatory Visit: Payer: Self-pay

## 2022-02-12 VITALS — BP 146/85 | HR 83 | Temp 98.2°F | Ht 69.0 in | Wt 231.0 lb

## 2022-02-12 DIAGNOSIS — F17210 Nicotine dependence, cigarettes, uncomplicated: Secondary | ICD-10-CM

## 2022-02-12 DIAGNOSIS — B20 Human immunodeficiency virus [HIV] disease: Secondary | ICD-10-CM

## 2022-02-12 DIAGNOSIS — Z5181 Encounter for therapeutic drug level monitoring: Secondary | ICD-10-CM | POA: Diagnosis not present

## 2022-02-12 DIAGNOSIS — Z113 Encounter for screening for infections with a predominantly sexual mode of transmission: Secondary | ICD-10-CM

## 2022-02-12 DIAGNOSIS — Z720 Tobacco use: Secondary | ICD-10-CM

## 2022-02-12 NOTE — Assessment & Plan Note (Signed)
He continues to do well with his regimen and no changes indicated.  Labs today and he will rtc in 6 months

## 2022-02-12 NOTE — Addendum Note (Signed)
Addended by: Caffie Pinto on: 02/12/2022 09:03 AM   Modules accepted: Orders

## 2022-02-12 NOTE — Assessment & Plan Note (Addendum)
He continues to smoke but recently quit marijuana and plans to then quit smoking, hopefully by next visit.   encouraged

## 2022-02-12 NOTE — Progress Notes (Signed)
   Subjective:    Patient ID: Shawn Brooks, male    DOB: 12-Sep-1975, 46 y.o.   MRN: [id number]  HPI He is here for follow up of HIV He continues on Genvoya and prezista with the later added for intensification and he has remained suppressed on this and doing well.  Denies any missed doses. No new issues.  No issues with getting, taking or tolerating his medications.   Unfortunately lost his job recently.     Review of Systems  Constitutional:  Negative for fatigue.  Gastrointestinal:  Negative for diarrhea and nausea.  Skin:  Negative for rash.       Objective:   Physical Exam Eyes:     General: No scleral icterus. Pulmonary:     Effort: Pulmonary effort is normal.  Skin:    Findings: No rash.  Neurological:     Mental Status: He is alert.   SH: + tobacco        Assessment & Plan:

## 2022-02-12 NOTE — Assessment & Plan Note (Signed)
No recent sexual activity.  Continues with the same partner and no outside activity.

## 2022-02-12 NOTE — Addendum Note (Signed)
Addended by: Caffie Pinto on: 02/12/2022 09:06 AM   Modules accepted: Orders

## 2022-02-12 NOTE — Assessment & Plan Note (Signed)
Will check his cmp

## 2022-02-15 LAB — COMPLETE METABOLIC PANEL WITH GFR
AG Ratio: 1.5 (calc) (ref 1.0–2.5)
ALT: 17 U/L (ref 9–46)
AST: 23 U/L (ref 10–40)
Albumin: 4.5 g/dL (ref 3.6–5.1)
Alkaline phosphatase (APISO): 127 U/L (ref 36–130)
BUN/Creatinine Ratio: 5 (calc) — ABNORMAL LOW (ref 6–22)
BUN: 6 mg/dL — ABNORMAL LOW (ref 7–25)
CO2: 27 mmol/L (ref 20–32)
Calcium: 10 mg/dL (ref 8.6–10.3)
Chloride: 103 mmol/L (ref 98–110)
Creat: 1.24 mg/dL (ref 0.60–1.29)
Globulin: 3.1 g/dL (calc) (ref 1.9–3.7)
Glucose, Bld: 93 mg/dL (ref 65–99)
Potassium: 4.2 mmol/L (ref 3.5–5.3)
Sodium: 137 mmol/L (ref 135–146)
Total Bilirubin: 0.3 mg/dL (ref 0.2–1.2)
Total Protein: 7.6 g/dL (ref 6.1–8.1)
eGFR: 72 mL/min/{1.73_m2} (ref >=60)

## 2022-02-15 LAB — T-HELPER CELLS (CD4) COUNT (NOT AT ARMC)
Absolute CD4: 949 cells/uL (ref 490–1740)
CD4 T Helper %: 34 % (ref 30–61)
Total lymphocyte count: 2765 cells/uL (ref 850–3900)

## 2022-02-15 LAB — HIV-1 RNA QUANT-NO REFLEX-BLD
HIV 1 RNA Quant: <20 Copies/mL — ABNORMAL HIGH
HIV-1 RNA Quant, Log: <1.3 Log cps/mL — ABNORMAL HIGH

## 2022-03-02 ENCOUNTER — Telehealth (HOSPITAL_COMMUNITY): Payer: Self-pay

## 2022-03-02 ENCOUNTER — Encounter: Payer: Self-pay | Admitting: Internal Medicine

## 2022-03-02 ENCOUNTER — Other Ambulatory Visit (HOSPITAL_COMMUNITY): Payer: Self-pay | Admitting: Physician Assistant

## 2022-03-02 DIAGNOSIS — F319 Bipolar disorder, unspecified: Secondary | ICD-10-CM

## 2022-03-02 MED ORDER — SERTRALINE HCL 100 MG PO TABS: 200.0000 mg | ORAL_TABLET | Freq: Every day | ORAL | 1 refills | Status: DC

## 2022-03-02 NOTE — Telephone Encounter (Signed)
Provider was contacted by Pamella Pert. Lovena Le, RN regarding refill request for this patient's medication.  Patient's medication to be e-prescribed to pharmacy of choice.

## 2022-03-02 NOTE — Progress Notes (Unsigned)
Provider was contacted by Pamella Pert. Lovena Le, RN regarding refill request for this patient's medication.  Patient's medication to be e-prescribed to pharmacy of choice.

## 2022-03-02 NOTE — Telephone Encounter (Signed)
Medication refill request - Fax from patient's [facility] in [place] for a new Sertraline 100 mg, 2 at night order, last provided 11/04/21 + 1 refill and filled last on 02/02/22.  Patient no showed for appt with Dr. Rosita Kea 01/06/22 and last seen 11/04/21.  No current appointment scheduled.

## 2022-03-04 ENCOUNTER — Telehealth: Payer: Self-pay

## 2022-03-04 NOTE — Telephone Encounter (Addendum)
Patient advised. Samples of Recticare at the front desk 3rd floor for pick up. Patient declined 03/09/22. Will leave the original date of 03/16/22.

## 2022-03-04 NOTE — Telephone Encounter (Signed)
DOD Patient of Nicoletta Ba, PA-C and Dr Hilarie Fredrickson, MD last seen 11/30/21 for rectal bleeding and hemorrhoid. He was given Anusol suppositories and an appointment for hemorrhoid banding. He was unable to tolerate suppositories and was later changed to cream per patient. He missed the banding appointment in July. Presently scheduled for 03/16/22. (I will offer him 03/09/22) The patient calls with complaints of a sharp increase in the pain at the rectum. States he has kept bowel movements soft. No pain with passage of stool, only burning. He is experiencing sharp, stabbing and shooting pain at the rectum. The pain would wake him up during the night. He uses TUCKS moistened wipes. He had been taking sitz bathes, but not past few days. The patient is calling because of the pain and asks what can be done.

## 2022-03-04 NOTE — Telephone Encounter (Signed)
Difficult to say without examining him, but perhaps he has an anal fissure.  Recticare ointment three times daily, and keep appointment with Dr. Hilarie Fredrickson next week.  - HD

## 2022-03-08 ENCOUNTER — Encounter: Payer: Self-pay | Admitting: *Deleted

## 2022-03-09 ENCOUNTER — Encounter: Payer: Commercial Managed Care - HMO | Admitting: Internal Medicine

## 2022-03-10 ENCOUNTER — Encounter: Payer: Self-pay | Admitting: Internal Medicine

## 2022-03-16 ENCOUNTER — Other Ambulatory Visit: Payer: Commercial Managed Care - HMO

## 2022-03-16 ENCOUNTER — Ambulatory Visit (INDEPENDENT_AMBULATORY_CARE_PROVIDER_SITE_OTHER): Payer: Commercial Managed Care - HMO | Admitting: Internal Medicine

## 2022-03-16 ENCOUNTER — Encounter: Payer: Self-pay | Admitting: Internal Medicine

## 2022-03-16 VITALS — BP 112/70 | HR 88 | Ht 69.5 in | Wt 223.0 lb

## 2022-03-16 DIAGNOSIS — K602 Anal fissure, unspecified: Secondary | ICD-10-CM

## 2022-03-16 DIAGNOSIS — K625 Hemorrhage of anus and rectum: Secondary | ICD-10-CM | POA: Diagnosis not present

## 2022-03-16 DIAGNOSIS — R197 Diarrhea, unspecified: Secondary | ICD-10-CM

## 2022-03-16 MED ORDER — DILTIAZEM GEL 2 %: 1.0000 | Freq: Two times a day (BID) | CUTANEOUS | 1 refills | Status: DC

## 2022-03-16 NOTE — Progress Notes (Signed)
Subjective:    Patient ID: Shawn Brooks, male    DOB: 1976/05/07, 46 y.o.   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 pylori negative gastritis who is here for follow-up and to consider hemorrhoidal banding.  He also has a history of HIV on antiviral therapy, bipolar disorder and seizure disorder.  He was seen in June 2023 by Nicoletta Ba, PA-C at which point he had a friable internal hemorrhoid and returns today to consider banding.  He reports that he has had 3 to 4 months of 5-6 loose diarrhea-like stools per day.  He has had intermittent bleeding with his bowel movements but his bowel movements are painful when he sees the blood as the stool is passing.  He is not definitively feeling any prolapsing or perianal bulging.  No recent changes in his medication.   Review of Systems As per HPI, otherwise negative  Current Medications, Allergies, Past Medical History, Past Surgical History, Family History and Social History were reviewed in [employer] record.      Objective:   Physical Exam BP 112/70 (BP Location: Left Arm, Patient Position: Sitting, Cuff Size: Normal)   Pulse 88   Ht 5' 9.5" (1.765 m) Comment: height measured without shoes  Wt 223 lb (101.2 kg)   BMI 32.46 kg/m . Gen: awake, alert, NAD HEENT: anicteric,  Abd: soft, NT/ND, +BS throughout Rectal: No external lesions, very tender digital rectal exam in the posterior anal canal, no palpable masses, light brown liquid stool in the vault Ext: no c/c/e Neuro: nonfocal  HIV quant neg CD4 949  CMP     Component Value Date/Time   NA 137 02/12/2022 0906   NA 135 10/13/2021 1538   K 4.2 02/12/2022 0906   CL 103 02/12/2022 0906   CO2 27 02/12/2022 0906   GLUCOSE 93 02/12/2022  0906   BUN 6 (L) 02/12/2022 0906   BUN 8 10/13/2021 1538   CREATININE 1.24 02/12/2022 0906   CALCIUM 10.0 02/12/2022 0906   PROT 7.6 02/12/2022 0906   PROT 7.7 06/11/2021 1600   ALBUMIN 5.0 06/11/2021 1600   AST 23 02/12/2022 0906   ALT 17 02/12/2022 0906   ALKPHOS 107 06/11/2021 1600   BILITOT 0.3 02/12/2022 0906   BILITOT 0.2 06/11/2021 1600   GFRNONAA 64 07/24/2020 0914   GFRAA 74 07/24/2020 0914   CBC    Component Value Date/Time   WBC 9.7 10/13/2021 1538   WBC 12.9 (H) 08/09/2019 0922   RBC 4.76 10/13/2021 1538   RBC 4.84 08/09/2019 0922   HGB 15.5 10/13/2021 1538   HCT 43.9 10/13/2021 1538   PLT 236 10/13/2021 1538   MCV 92 10/13/2021 1538   MCH 32.6 10/13/2021 1538   MCH 31.6 08/09/2019 0922   MCHC 35.3 10/13/2021 1538   MCHC 34.9 08/09/2019 0922   RDW 13.8 10/13/2021 1538   LYMPHSABS 2.3 10/13/2021 1538   MONOABS 0.7 06/18/2019 0935   EOSABS 0.0 10/13/2021 1538   BASOSABS 0.0 10/13/2021 1538       Assessment & Plan:  46 year old male with a history of symptomatic internal hemorrhoids, previous hemorrhoidal banding x2 in 2019, 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 pylori negative gastritis who is here for follow-up  and to consider hemorrhoidal banding.   Anal fissure with bleeding --likely exacerbated by recent diarrhea.  We discussed hemorrhoidal banding and planned hemorrhoidal banding today but I recommend we treat anal fissure first. -- Diltiazem gel 2%, apply pea-sized amount inserted into the anal canal to the first knuckle twice daily for 4 to 6 weeks until symptoms have resolved and then 1 week longer -- RectiCare can be used over-the-counter as needed for pain  2.  Diarrhea --chronic at this point as it has been 3 or 4 months.  [facility] gastrointestinal stool panel done by rectal swab today in clinic. -- Follow-up [employer] gastrointestinal panel to exclude infectious etiologies  3.   Internal hemorrhoids --seen on anoscopy 3 months ago.  Once fissure heals he can return for additional hemorrhoidal banding as needed.  We will give an appointment for November.  4.  History of colon polyps --adenomatous, surveillance colonoscopy October 2024  30 minutes total spent today including patient facing time, coordination of care, reviewing medical history/procedures/pertinent radiology studies, and documentation of the encounter.

## 2022-03-16 NOTE — Patient Instructions (Signed)
_______________________________________________________  If you are age 48 or older, your body mass index should be between 23-30. Your Body mass index is 32.46 kg/m. If this is out of the aforementioned range listed, please consider follow up with your Primary Care Provider.  If you are age 55 or younger, your body mass index should be between 19-25. Your Body mass index is 32.46 kg/m. If this is out of the aformentioned range listed, please consider follow up with your Primary Care Provider.   ________________________________________________________  The [facility] providers would like to encourage you to use [facility] to communicate with providers for non-urgent requests or questions.  Due to long hold times on the telephone, sending your provider a message by [facility] may be a faster and more efficient way to get a response.  Please allow 48 business hours for a response.  Please remember that this is for non-urgent requests.  _______________________________________________________  We have sent a prescription for Diltiazem gel to [facility]. You should apply a pea size amount to your rectum two times daily x 6-8 weeks.  [facility]'s information is below: Address: [address] [place], [place] [address]  [phone]  *Please DO NOT go directly from our office to pick up this medication! Give the pharmacy 1 day to process the prescription as this is compounded and takes time to make.

## 2022-03-18 ENCOUNTER — Telehealth (INDEPENDENT_AMBULATORY_CARE_PROVIDER_SITE_OTHER): Payer: Commercial Managed Care - HMO | Admitting: Physician Assistant

## 2022-03-18 DIAGNOSIS — F411 Generalized anxiety disorder: Secondary | ICD-10-CM | POA: Diagnosis not present

## 2022-03-18 DIAGNOSIS — F319 Bipolar disorder, unspecified: Secondary | ICD-10-CM | POA: Diagnosis not present

## 2022-03-18 MED ORDER — RISPERIDONE 4 MG PO TABS: 4.0000 mg | ORAL_TABLET | Freq: Every day | ORAL | 2 refills | Status: DC

## 2022-03-18 MED ORDER — FLUOXETINE HCL 20 MG PO CAPS: 20.0000 mg | ORAL_CAPSULE | Freq: Every day | ORAL | 2 refills | Status: DC

## 2022-03-18 MED ORDER — SERTRALINE HCL 25 MG PO TABS: 25.0000 mg | ORAL_TABLET | Freq: Every day | ORAL | 0 refills | Status: DC

## 2022-03-18 MED ORDER — TRAZODONE HCL 300 MG PO TABS: 300.0000 mg | ORAL_TABLET | Freq: Every evening | ORAL | 2 refills | Status: DC | PRN

## 2022-03-19 ENCOUNTER — Encounter (HOSPITAL_COMMUNITY): Payer: Self-pay | Admitting: Physician Assistant

## 2022-03-19 NOTE — Progress Notes (Signed)
Rock [facility] MD/PA/NP OP Progress Note  Virtual Visit via Video Note  I connected with Shawn Brooks on 03/19/22 at 11:00 AM EDT by a video enabled telemedicine application and verified that I am speaking with the correct person using two identifiers.  Location: Patient: Home Provider: Clinic   I discussed the limitations of evaluation and management by telemedicine and the availability of in person appointments. The patient expressed understanding and agreed to proceed.  Follow Up Instructions:   I discussed the assessment and treatment plan with the patient. The patient was provided an opportunity to ask questions and all were answered. The patient agreed with the plan and demonstrated an understanding of the instructions.   The patient was advised to call back or seek an in-person evaluation if the symptoms worsen or if the condition fails to improve as anticipated.  I provided 23 minutes of non-face-to-face time during this encounter.  Shawn Mood, PA   03/19/2022 8:43 PM [place] Shawn Brooks  MRN:  [id number]  Chief Complaint:  Chief Complaint  Patient presents with   Medication Management   Follow-up   HPI:   Shawn Brooks is a 46 year old male with a past psychiatric history significant for bipolar disorder who presents to [facility] via virtual video visit for follow-up and medication management. Patient is currently being managed on the following medications:  Sertraline 200 mg daily Risperidone 4 mg at bedtime Ramelteon 8 mg at bedtime  Patient presents to the encounter feeling depressed and endorsing anxiety.  He reports that the ramelteon that he was given during his last encounter has not been helpful in managing him sleep either.  Patient states that he went back to taking his trazodone to aid in his sleep and that he has been taking 2 to 3 tablets of his 100 mg trazodone in an effort to sleep.  Patient reports that since he has  been taking his trazodone more regularly, he has since run out.  Patient denies any major events or triggers to his ongoing depression or anxiety but does state that his depression and anxiety have been building for months.  Patient states that he experiences depression every day characterized by the following symptoms: the overwhelming need to cry, self-isolation, increased sleep, and lack of motivation.  Patient rates his anxiety as 7 out of 10, but denies any new stressors.  Provider inquired if patient has a past history of using buspirone for the management of anxiety to which the patient replied with that he has not tried buspirone in the past.  Patient denies any other major issues at this time.  A PHQ-9 screen was performed with the patient scoring a 20.  A GAD-7 screen was also performed with the patient scoring a 16.  Patient is alert and oriented x4, calm, cooperative and fully engaged in conversation during the encounter.  Patient endorses fine Brooks.  Patient denies suicidal or homicidal ideations.  He further denies auditory or visual hallucinations and does not appear to be responding to internal/external stimuli.  Patient endorses fair sleep even through the use of trazodone and receives on average 5 to 6 hours of sleep each night.  Patient endorses decreased appetite and eats on average 1 meal per day.  Patient states that he is often unable to finish even half a meal.  Patient endorses moderate alcohol consumption.  Patient endorses tobacco use and smokes on average 1/2 pack/day.  Patient denies illicit drug use and states that  he stopped smoking weed a month ago.  Visit Diagnosis:    ICD-10-CM   1. Bipolar 1 disorder (HCC)  F31.9 sertraline (ZOLOFT) 25 MG tablet    traZODone (DESYREL) 300 MG tablet    risperidone (RISPERDAL) 4 MG tablet    FLUoxetine (PROZAC) 20 MG capsule      Past Psychiatric History:  Bipolar 1 disorder  Past Medical History:  Past Medical History:  Diagnosis  Date   Anal condyloma    Anxiety    Blood dyscrasia    HIV   Chronic back pain    Depression    DJD (degenerative joint disease)    Gastric ulcer    GERD (gastroesophageal reflux disease)    Headache(784.0)    Hiatal hernia    HIV infection ([place])    Hyperplastic colon polyp    Hypertension    Internal hemorrhoids     Past Surgical History:  Procedure Laterality Date   ESOPHAGOGASTRODUODENOSCOPY  03/27/2012   Procedure: ESOPHAGOGASTRODUODENOSCOPY (EGD);  Surgeon: Lear Ng, MD;  Location: [facility];  Service: Endoscopy;  Laterality: N/A;   IRRIGATION AND DEBRIDEMENT ABSCESS N/A 03/13/2015   Procedure: IRRIGATION AND DEBRIDEMENT ABSCESS;  Surgeon: Johnathan Hausen, MD;  Location: [facility];  Service: General;  Laterality: N/A;   WISDOM TOOTH EXTRACTION      Family Psychiatric History:  Denied  Family History:  Family History  Problem Relation Age of Onset   Cancer Mother        laryngeal   Pneumonia Father    Diabetes Father    Hypertension Sister    Crohn's disease Brother    Crohn's disease Maternal Grandmother    Cancer Maternal Grandmother        patient unsure of type   Colon cancer Maternal Grandfather     Social History:  Social History   Socioeconomic History   Marital status: Divorced    Spouse name: Not on file   Number of children: 1   Years of education: Not on file   Highest education level: Not on file  Occupational History   Occupation: Disabled  Tobacco Use   Smoking status: Every Day    Packs/day: 1.00    Types: Cigarettes   Smokeless tobacco: Never   Tobacco comments:    not ready to quit.  Vaping Use   Vaping Use: Never used  Substance and Sexual Activity   Alcohol use: Yes    Comment: daily   Drug use: Not Currently    Frequency: 7.0 times per week    Types: Marijuana    Comment: recently quit smoking marijuana   Sexual activity: Not Currently    Partners: Male    Comment: declined condoms  Other Topics Concern   Not  on file  Social History Narrative   Not on file   Social Determinants of Health   Financial Resource Strain: Not on file  Food Insecurity: Not on file  Transportation Needs: Not on file  Physical Activity: Not on file  Stress: Not on file  Social Connections: Not on file    Allergies:  Allergies  Allergen Reactions   Chantix [Varenicline] Other (See Comments)    Bad dreams   Oxycodone Itching    Metabolic Disorder Labs: Lab Results  Component Value Date   HGBA1C 5.6 06/11/2021   MPG 117 (H) 11/16/2013   No results found for: "PROLACTIN" Lab Results  Component Value Date   CHOL 198 06/11/2021   TRIG 158 (H) 06/11/2021  HDL 61 06/11/2021   CHOLHDL 3.2 06/11/2021   VLDL 28 05/12/2016   LDLCALC 110 (H) 06/11/2021   LDLCALC 156 (H) 06/17/2020   Lab Results  Component Value Date   TSH 1.150 09/01/2017   TSH 1.080 01/27/2017    Therapeutic Level Labs: No results found for: "LITHIUM" No results found for: "VALPROATE" No results found for: "CBMZ"  Current Medications: Current Outpatient Medications  Medication Sig Dispense Refill   FLUoxetine (PROZAC) 20 MG capsule Take 1 capsule (20 mg total) by mouth daily. 30 capsule 2   albuterol (VENTOLIN HFA) 108 (90 Base) MCG/ACT inhaler Inhale 2 puffs into the lungs every 6 (six) hours as needed for wheezing or shortness of breath. 18 g 1   darunavir (PREZISTA) 800 MG tablet Take 1 tablet (800 mg total) by mouth daily. 30 tablet 11   diltiazem 2 % GEL Apply 1 Application topically 2 (two) times daily. 30 g 1   elvitegravir-cobicistat-emtricitabine-tenofovir (GENVOYA) 150-150-200-10 MG TABS tablet Take one tab daily 30 tablet 11   ramelteon (ROZEREM) 8 MG tablet Take 1 tablet (8 mg total) by mouth at bedtime. 30 tablet 3   risperidone (RISPERDAL) 4 MG tablet Take 1 tablet (4 mg total) by mouth at bedtime. 30 tablet 2   sertraline (ZOLOFT) 25 MG tablet Take 1 tablet (25 mg total) by mouth at bedtime. Once patient has finished  medication. Patient to start fluoxetine 20 mg daily 3 tablet 0   traZODone (DESYREL) 300 MG tablet Take 1 tablet (300 mg total) by mouth at bedtime as needed. for sleep 30 tablet 2   valACYclovir (VALTREX) 500 MG tablet TAKE 1 TABLET BY MOUTH TWICE DAILY 60 tablet 5   No current facility-administered medications for this visit.     Musculoskeletal: Strength & Muscle Tone: Unable to assess due to telemedicine visit [place]: Unable to assess due to telemedicine visit Patient leans: Unable to assess due to telemedicine visit  Psychiatric Specialty Exam: Review of Systems  Psychiatric/Behavioral:  Positive for decreased concentration and sleep disturbance. Negative for dysphoric Brooks, hallucinations, self-injury and suicidal ideas. The patient is nervous/anxious. The patient is not hyperactive.     There were no vitals taken for this visit.There is no height or weight on file to calculate BMI.  General Appearance: Unable to assess due to telemedicine visit  Eye Contact:  Unable to assess due to telemedicine visit  Speech:  Clear and Coherent and Normal Rate  Volume:  Normal  Brooks:  Anxious and Depressed  Affect:  Congruent  Thought Process:  Coherent, Goal Directed, and Descriptions of Associations: Intact  Orientation:  Full (Time, Place, and Person)  Thought Content: WDL   Suicidal Thoughts:  No  Homicidal Thoughts:  No  Memory:  Immediate;   Good Recent;   Good Remote;   Good  Judgement:  Good  Insight:  Good  Psychomotor Activity:  Normal  Concentration:  Concentration: Good and Attention Span: Good  Recall:  Good  Fund of Knowledge: Good  Language: Good  Akathisia:  No  Handed:  Right  AIMS (if indicated): not done  Assets:  Communication Skills Desire for Improvement Financial Resources/Insurance Housing Social Support Transportation Vocational/Educational  ADL's:  Intact  Cognition: WNL  Sleep:  Good   Screenings: GAD-7    Flowsheet Row Video Visit  from 03/18/2022 in [facility] Video Visit from 11/04/2021 in [facility] Office Visit from 10/13/2021 in Primary Care at [facility]  Visit from 06/11/2021 in [place] Video Visit from 05/01/2021 in [facility]  Total GAD-7 Score '16 11 6 '$ 0 4      PHQ2-9    Flowsheet Row Video Visit from 03/18/2022 in [facility] Office Visit from 02/12/2022 in [facility] Video Visit from 11/04/2021 in [facility] Office Visit from 10/13/2021 in Primary Care at [place] Visit from 07/21/2021 in [facility]  PHQ-2 Total Score '5 1 3 1 '$ 0  PHQ-9 Total Score 20 -- 13 9 --      Flowsheet Row Video Visit from 03/18/2022 in [facility] Video Visit from 11/04/2021 in [facility] Video Visit from 05/01/2021 in [place] No Risk No Risk No Risk        Assessment and Plan:   Arliss M. Corlew is a 46 year old male with a past psychiatric history significant for bipolar disorder who presents to [facility] via virtual video visit for follow-up and medication management.  Patient reports that he has been experiencing worsening depression and anxiety that has building up for months.  Patient also notes that the ramelteon that he lives prescribed for the management of his sleep has not been effective.  Patient was recommended buspirone 7.5 mg 2 times daily for the management of his anxiety.  Patient would like to discontinue taking sertraline citing the medication has been effective.  Patient to be placed on fluoxetine 20 mg daily.  Prior to patient being placed on fluoxetine, patient to taper off sertraline.  Patient  to take sertraline 100 mg daily for 3 days, followed by 50 mg for 3 more days, then lastly concluded with sertraline 25 mg daily before discontinuing.  Patient to be placed back on trazodone 300 mg at bedtime for the management of his sleep.  Patient was agreeable to recommendations.  Patient's medications to be e-prescribed to pharmacy of choice.  Collaboration of Care: Collaboration of Care: Medication Management AEB patient's medications being managed by a psychiatric provider, Primary Care Provider AEB patient being seen by a primary care provider, Psychiatrist AEB patient being followed by a mental health provider, and Other provider involved in patient's care AEB patient being seen by Gastroenterology and Infectious Diseases  Patient/Guardian was advised Release of Information must be obtained prior to any record release in order to collaborate their care with an outside provider. Patient/Guardian was advised if they have not already done so to contact the registration department to sign all necessary forms in order for [place] to release information regarding their care.   Consent: Patient/Guardian gives verbal consent for treatment and assignment of benefits for services provided during this visit. Patient/Guardian expressed understanding and agreed to proceed.   1. Bipolar 1 disorder (HCC)  - sertraline (ZOLOFT) 25 MG tablet; Take 1 tablet (25 mg total) by mouth at bedtime. Once patient has finished medication. Patient to start fluoxetine 20 mg daily  Dispense: 3 tablet; Refill: 0 - traZODone (DESYREL) 300 MG tablet; Take 1 tablet (300 mg total) by mouth at bedtime as needed. for sleep  Dispense: 30 tablet; Refill: 2 - risperidone (RISPERDAL) 4 MG tablet; Take 1 tablet (4 mg total) by mouth at bedtime.  Dispense: 30 tablet; Refill: 2 - FLUoxetine (PROZAC) 20 MG capsule; Take 1 capsule (20 mg total)  by mouth daily.  Dispense: 30 capsule; Refill: 2  2. Anxiety state  - busPIRone (BUSPAR) 7.5 MG  tablet; Take 1 tablet (7.5 mg total) by mouth 2 (two) times daily.  Dispense: 60 tablet; Refill: 2  Patient to follow up 6 weeks Provider spent a total of 23 minutes with the patient/reviewing the patient's chart  Shawn Mood, PA 03/19/2022, 8:43 PM

## 2022-03-21 DIAGNOSIS — F411 Generalized anxiety disorder: Secondary | ICD-10-CM | POA: Insufficient documentation

## 2022-03-21 MED ORDER — BUSPIRONE HCL 7.5 MG PO TABS: 7.5000 mg | ORAL_TABLET | Freq: Two times a day (BID) | ORAL | 2 refills | Status: DC

## 2022-03-30 ENCOUNTER — Encounter: Payer: Self-pay | Admitting: Internal Medicine

## 2022-03-31 ENCOUNTER — Other Ambulatory Visit: Payer: Self-pay

## 2022-03-31 MED ORDER — DIPHENOXYLATE-ATROPINE 2.5-0.025 MG PO TABS: 1.0000 | ORAL_TABLET | Freq: Three times a day (TID) | ORAL | 2 refills | Status: DC | PRN

## 2022-03-31 NOTE — Telephone Encounter (Signed)
[employer] pathogen panel was performed and negative for infection If he is continue to have diarrhea he can try Lomotil 1 tablet 3 times daily as needed

## 2022-04-06 ENCOUNTER — Ambulatory Visit: Payer: Commercial Managed Care - HMO | Attending: Internal Medicine | Admitting: Internal Medicine

## 2022-04-06 ENCOUNTER — Encounter: Payer: Self-pay | Admitting: Internal Medicine

## 2022-04-06 VITALS — BP 111/73 | HR 83 | Ht 68.0 in | Wt 236.4 lb

## 2022-04-06 DIAGNOSIS — E782 Mixed hyperlipidemia: Secondary | ICD-10-CM | POA: Diagnosis not present

## 2022-04-06 DIAGNOSIS — Z8619 Personal history of other infectious and parasitic diseases: Secondary | ICD-10-CM

## 2022-04-06 DIAGNOSIS — Z6835 Body mass index (BMI) 35.0-35.9, adult: Secondary | ICD-10-CM

## 2022-04-06 DIAGNOSIS — Z2821 Immunization not carried out because of patient refusal: Secondary | ICD-10-CM

## 2022-04-06 DIAGNOSIS — F17200 Nicotine dependence, unspecified, uncomplicated: Secondary | ICD-10-CM | POA: Diagnosis not present

## 2022-04-06 MED ORDER — VALACYCLOVIR HCL 500 MG PO TABS: ORAL_TABLET | ORAL | 5 refills | Status: DC

## 2022-04-06 NOTE — Progress Notes (Signed)
Patient ID: Shawn Brooks, male    DOB: 1976-05-28  MRN: [id number]  CC: chronic ds management  Subjective: Shawn Brooks is a 46 y.o. male who presents for chronic ds His concerns today include:  HIV, DJD,  hyperlipidemia, anxiety, bipolar 1  (followed by psychiatry ), tobacco dependence, vit D deficiency, numbness in hands (neg EMG 2019), chronic bilateral lower back pain , rectal fissure, internal hemorrhoids.  Patient was last seen 05/2021. He has been dealing with some issues with diarrhea.  He has seen the gastroenterologist Dr. Hilarie Brooks.  Started on Lomotil.  Worked well but then cause some constipation.  He backed off taking it every day.  HL: When I last saw him, he was on Crestor 20 mg daily.  Currently not on med list.  Patient thinks he is out of refills.  Tobacco dependence: He has smoked for 31 years.  For most of those years he smoked 1/3 -1/2 pk/day.  Started smoking a pack a day earlier this year.  Not ready to quit.  Obesity: Reports he is not getting in any activity at all because when he does his needs and hurting for several days in his legs and back.  He feels his portion sizes are not too large.  Eats 2 meals a day.  Patient requests refill on Valtrex which he takes as needed for herpes outbreak.  He states that the outbreaks are infrequent for him.  HM: Declines flu shot.    Patient Active Problem List   Diagnosis Date Noted   Anxiety state 03/21/2022   Influenza vaccine refused 06/17/2020   Esophageal dysphagia 06/17/2020   Mixed hyperlipidemia 06/17/2020   Obesity (BMI 30.0-34.9) 06/17/2020   History of farsightedness 06/17/2020   Bipolar 1 disorder ([place]) 05/01/2020   Chronic night sweats 04/10/2020   Counseled about COVID-19 virus infection 09/18/2019   Urinary frequency 12/26/2018   Medication monitoring encounter 08/08/2018   Memory problem 09/01/2017   Vitamin D deficiency 09/01/2017   Primary osteoarthritis of left knee 05/02/2017   Stage 3  chronic kidney disease ([place]) 05/02/2017   Prediabetes 12/22/2016   Tobacco abuse 05/31/2016   Lumbar transverse process fracture ([place]) 10/02/2015   Screening examination for sexually transmitted disease 04/04/2014   Seizures ([place]) 08/30/2013   Thoracic or lumbosacral neuritis or radiculitis 01/15/2013   H/O gastrointestinal disease 01/15/2013   Lumbar radiculopathy 01/15/2013   Carcinoma in situ of anal canal 11/17/2012   Condyloma acuminatum 11/17/2012   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   AGW (anogenital warts) 11/17/2012   Hemorrhoid 09/27/2012   BRBPR (bright red blood per rectum) 07/27/2012   Prostatitis 04/12/2012   Constipation 03/09/2012   Callus 08/16/2011   Ingrown toenail 08/16/2011   Chronic low back pain 08/16/2011   Dyslipidemia 09/03/2010   Herpes simplex virus (HSV) infection 07/06/2010   Erectile dysfunction 07/06/2010   ANXIETY DEPRESSION 05/06/2010   BURSITIS, KNEE 05/06/2010   Human immunodeficiency virus (HIV) disease ([place]) 04/16/2010     Current Outpatient Medications on File Prior to Visit  Medication Sig Dispense Refill   albuterol (VENTOLIN HFA) 108 (90 Base) MCG/ACT inhaler Inhale 2 puffs into the lungs every 6 (six) hours as needed for wheezing or shortness of breath. 18 g 1   busPIRone (BUSPAR) 7.5 MG tablet Take 1 tablet (7.5 mg total) by mouth 2 (two) times daily. 60 tablet 2   darunavir (PREZISTA) 800 MG tablet Take 1 tablet (800 mg total) by mouth daily. 30 tablet 11  diltiazem 2 % GEL Apply 1 Application topically 2 (two) times daily. 30 g 1   diphenoxylate-atropine (LOMOTIL) 2.5-0.025 MG tablet Take 1 tablet by mouth 3 (three) times daily as needed for diarrhea or loose stools. 60 tablet 2   elvitegravir-cobicistat-emtricitabine-tenofovir (GENVOYA) 150-150-200-10 MG TABS tablet Take one tab daily 30 tablet 11   FLUoxetine (PROZAC) 20 MG capsule Take 1 capsule (20 mg total) by mouth daily. 30 capsule 2   ramelteon  (ROZEREM) 8 MG tablet Take 1 tablet (8 mg total) by mouth at bedtime. 30 tablet 3   risperidone (RISPERDAL) 4 MG tablet Take 1 tablet (4 mg total) by mouth at bedtime. 30 tablet 2   sertraline (ZOLOFT) 25 MG tablet Take 1 tablet (25 mg total) by mouth at bedtime. Once patient has finished medication. Patient to start fluoxetine 20 mg daily 3 tablet 0   traZODone (DESYREL) 300 MG tablet Take 1 tablet (300 mg total) by mouth at bedtime as needed. for sleep 30 tablet 2   valACYclovir (VALTREX) 500 MG tablet TAKE 1 TABLET BY MOUTH TWICE DAILY 60 tablet 5   No current facility-administered medications on file prior to visit.    Allergies  Allergen Reactions   Chantix [Varenicline] Other (See Comments)    Bad dreams   Oxycodone Itching    Social History   Socioeconomic History   Marital status: Divorced    Spouse name: Not on file   Number of children: 1   Years of education: Not on file   Highest education level: Not on file  Occupational History   Occupation: Disabled  Tobacco Use   Smoking status: Every Day    Packs/day: 1.00    Types: Cigarettes   Smokeless tobacco: Never   Tobacco comments:    not ready to quit.  Vaping Use   Vaping Use: Never used  Substance and Sexual Activity   Alcohol use: Yes    Comment: daily   Drug use: Not Currently    Frequency: 7.0 times per week    Types: Marijuana    Comment: recently quit smoking marijuana   Sexual activity: Not Currently    Partners: Male    Comment: declined condoms  Other Topics Concern   Not on file  Social History Narrative   Not on file   Social Determinants of Health   Financial Resource Strain: Not on file  Food Insecurity: Not on file  Transportation Needs: Not on file  Physical Activity: Not on file  Stress: Not on file  Social Connections: Not on file  Intimate Partner Violence: Not on file    Family History  Problem Relation Age of Onset   Cancer Mother        laryngeal   Pneumonia Father     Diabetes Father    Hypertension Sister    Crohn's disease Brother    Crohn's disease Maternal Grandmother    Cancer Maternal Grandmother        patient unsure of type   Colon cancer Maternal Grandfather     Past Surgical History:  Procedure Laterality Date   ESOPHAGOGASTRODUODENOSCOPY  03/27/2012   Procedure: ESOPHAGOGASTRODUODENOSCOPY (EGD);  Surgeon: Lear Ng, MD;  Location: [facility];  Service: Endoscopy;  Laterality: N/A;   IRRIGATION AND DEBRIDEMENT ABSCESS N/A 03/13/2015   Procedure: IRRIGATION AND DEBRIDEMENT ABSCESS;  Surgeon: Johnathan Hausen, MD;  Location: [facility];  Service: General;  Laterality: N/A;   WISDOM TOOTH EXTRACTION      ROS: Review of Systems  Negative except as stated above  PHYSICAL EXAM: BP 111/73   Pulse 83   Ht '5\' 8"'$  (1.727 m)   Wt 236 lb 6.4 oz (107.2 kg)   SpO2 99%   BMI 35.94 kg/m   Wt Readings from Last 3 Encounters:  04/06/22 236 lb 6.4 oz (107.2 kg)  03/16/22 223 lb (101.2 kg)  02/12/22 231 lb (104.8 kg)    Physical Exam  General appearance - alert, well appearing, middle-age African-American male and in no distress Mental status - normal mood, behavior, speech, dress, motor activity, and thought processes Neck - supple, no significant adenopathy Chest - clear to auscultation, no wheezes, rales or rhonchi, symmetric air entry Heart - normal rate, regular rhythm, normal S1, S2, no murmurs, rubs, clicks or gallops Extremities - peripheral pulses normal, no pedal edema, no clubbing or cyanosis     Latest Ref Rng & Units 02/12/2022    9:06 AM 10/13/2021    3:38 PM 06/11/2021    4:00 PM  CMP  Glucose 65 - 99 mg/dL 93  89  73   BUN 7 - 25 mg/dL '6  8  9   '$ Creatinine 0.60 - 1.29 mg/dL 1.24  1.15  1.30   Sodium 135 - 146 mmol/L 137  135  136   Potassium 3.5 - 5.3 mmol/L 4.2  4.2  4.0   Chloride 98 - 110 mmol/L 103  97  97   CO2 20 - 32 mmol/L '27  20  23   '$ Calcium 8.6 - 10.3 mg/dL 10.0  9.7  9.7   Total Protein 6.1 - 8.1 g/dL  7.6   7.7   Total Bilirubin 0.2 - 1.2 mg/dL 0.3   0.2   Alkaline Phos 44 - 121 IU/L   107   AST 10 - 40 U/L 23   27   ALT 9 - 46 U/L 17   24    Lipid Panel     Component Value Date/Time   CHOL 198 06/11/2021 1600   TRIG 158 (H) 06/11/2021 1600   HDL 61 06/11/2021 1600   CHOLHDL 3.2 06/11/2021 1600   CHOLHDL 4.7 08/09/2019 0922   VLDL 28 05/12/2016 0955   LDLCALC 110 (H) 06/11/2021 1600   LDLCALC  08/09/2019 0922     Comment:     . LDL cholesterol not calculated. Triglyceride levels greater than 400 mg/dL invalidate calculated LDL results. . Reference range: <100 . Desirable range <100 mg/dL for primary prevention;   <70 mg/dL for patients with CHD or diabetic patients  with > or = 2 CHD risk factors. Marland Kitchen LDL-C is now calculated using the Martin-Hopkins  calculation, which is a validated novel method providing  better accuracy than the Friedewald equation in the  estimation of LDL-C.  Cresenciano Genre et al. Annamaria Helling. 6195;093(26): 2061-2068  (http://education.[employer].com/faq/FAQ164)     CBC    Component Value Date/Time   WBC 9.7 10/13/2021 1538   WBC 12.9 (H) 08/09/2019 0922   RBC 4.76 10/13/2021 1538   RBC 4.84 08/09/2019 0922   HGB 15.5 10/13/2021 1538   HCT 43.9 10/13/2021 1538   PLT 236 10/13/2021 1538   MCV 92 10/13/2021 1538   MCH 32.6 10/13/2021 1538   MCH 31.6 08/09/2019 0922   MCHC 35.3 10/13/2021 1538   MCHC 34.9 08/09/2019 0922   RDW 13.8 10/13/2021 1538   LYMPHSABS 2.3 10/13/2021 1538   MONOABS 0.7 06/18/2019 0935   EOSABS 0.0 10/13/2021 1538   BASOSABS 0.0 10/13/2021 1538  ASSESSMENT AND PLAN: 1. Mixed hyperlipidemia We will check lipid profile to see whether he needs to be back on Crestor. - Lipid panel  2. Class 2 severe obesity due to excess calories with serious comorbidity and body mass index (BMI) of 35.0 to 35.9 in adult [facility]) Patient advised to eliminate sugary drinks from the diet, cut back on portion sizes especially of white  carbohydrates, eat more white lean meat like chicken [place] and seafood instead of beef or pork and incorporate fresh fruits and vegetables into the diet daily. Encouraged him to try to get him some exercise.  If he feels he is not able to walk on a regular surface, recommend trying water aerobics  3. History of herpes genitalis Refill given on Valtrex - valACYclovir (VALTREX) 500 MG tablet; TAKE 1 TABLET BY MOUTH TWICE DAILY  Dispense: 14 tablet; Refill: 5  4. Tobacco dependence Advised to quit.  Patient not ready to give a trial of quitting. Does not meet pack-year criteria for lung cancer screening as yet.  However I still advised him to quit.  5. Influenza vaccination declined   Patient was given the opportunity to ask questions.  Patient verbalized understanding of the plan and was able to repeat key elements of the plan.   This documentation was completed using Radio producer.  Any transcriptional errors are unintentional.  No orders of the defined types were placed in this encounter.    Requested Prescriptions    No prescriptions requested or ordered in this encounter    No follow-ups on file.  Karle Plumber, MD, FACP

## 2022-04-06 NOTE — Patient Instructions (Signed)

## 2022-04-07 LAB — LIPID PANEL
Chol/HDL Ratio: 2.9 ratio (ref 0.0–5.0)
Cholesterol, Total: 194 mg/dL (ref 100–199)
HDL: 68 mg/dL (ref >39)
LDL Chol Calc (NIH): 97 mg/dL (ref 0–99)
Triglycerides: 169 mg/dL — ABNORMAL HIGH (ref 0–149)
VLDL Cholesterol Cal: 29 mg/dL (ref 5–40)

## 2022-05-03 ENCOUNTER — Other Ambulatory Visit (HOSPITAL_COMMUNITY): Payer: Self-pay | Admitting: Physician Assistant

## 2022-05-03 DIAGNOSIS — F319 Bipolar disorder, unspecified: Secondary | ICD-10-CM

## 2022-05-04 NOTE — Progress Notes (Unsigned)
[facility] Outpatient Progress Note  05/05/2022 5:02 PM Shawn Brooks  MRN:  [id number]  Assessment:  Shawn Brooks presents for follow-up evaluation. Today, 05/05/22, patient reports overall psychiatric stability and denies signs/sx of depression, mania, or significant anxiety despite ongoing psychosocial stressors. At last visit, plan was to cross titrate from Zoloft to Prozac however patient has remained on both. Denies adverse effects or signs/sx of serotonin syndrome. Reviewed plan to taper and discontinue Zoloft as outlined below and patient demonstrated understanding of instructions with expressed intent to do so. Patient also endorses recent increase in alcohol use related to no longer working; he denies dysfunction related to use or signs/sx of dependency or withdrawal at this time and was receptive to psychoeducation. He expressed intent to gradually reduce etoh consumption. No other changes to plan of care at this time. Referral placed to CM to assist with community resources.   Identifying Information: Shawn Brooks is a 46 y.o. male with historical diagnosis of bipolar 1 disorder history, HIV on HAART threapy, HLD, and Vitamin D deficiency who is an established patient with [facility] participating in follow-up via video conferencing.   Plan:  # Historical diagnosis of bipolar 1 disorder Past medication trials: Zoloft Status of problem: stable Interventions: -- Continue risperidone 4 mg at bedtime -- Patient never discontinued Zoloft 200 mg daily as previously discussed by prior provider; plan to taper and discontinue today  -- DECREASE to 100 mg daily for 1 week followed by 50 mg for 1 week then STOP -- Continue Prozac 20 mg daily -- Continue Buspar 7.5 mg nightly -- Will refer patient to CM for assistance with community resources and disability process  # Insomnia Past medication trials:  Status of problem: stable Interventions: -- Continue trazodone  300 mg nightly  # Increasing etoh use Past medication trials: none Status of problem: acute Interventions: -- Psychoeducation provided regarding risks of dependency, tolerance, and withdrawal as well as safe limits of use; patient expresses intent to reduce use  # Metabolic monitoring Interventions: -- SGA: lipid profile revealing for elevated TG on 04/06/22 (followed by PCP); Hgb A1c of 5.6 on 06/12/21 (will need repeat A1c Dec 2023)  Patient was given contact information for behavioral health clinic and was instructed to call 911 for emergencies.   Subjective:  Chief Complaint:  Chief Complaint  Patient presents with   Medication Management    Interval History:   Last seen by Trinna Post, PA on 03/18/2022.  At that time, managed on: Sertraline 200 mg daily Risperidone 4 mg at bedtime Ramelteon 8 mg at bedtime During that visit, patient was started on BuSpar 7.5 mg twice daily due to ongoing depressive and anxiety symptoms.  Patient was also given plan to cross titrate from sertraline to Prozac 20 mg daily.  Patient had reported discontinuing ramelteon due to inefficacy and instead taking trazodone 300 mg nightly which was continued.  Today, patient reports things have been "pretty good." Mood has been overall good and denies persistent periods of depression. Reviewed current medications - he reports confusion regarding instructions to taper off Zoloft so has remained on 200 mg daily although ran out on 11/4. Has continued other medications as prescribed. Denies any adverse effects upon starting Prozac and Buspar and feels these medications have been helpful for mood. Denies SI, HI, AVH. Trazodone remains helpful for sleep - getting about 6-8 hours nightly. Denies dizziness or lightheadedness. Endorses recent episodes of diarrhea related to chronic GI problems for  which he is followed by gastroenterology.   Currently homeless and staying with a friend; in process of filing for  disability. Would be interested in talking to case manager.   Endorses cannabis use 3-4 times weekly. Denies other illicit drug use. Endorses drinking 6 beers daily; denies history of withdrawal and does not feel current level of alcohol use is an issue or has led to dysfunction. Denies cravings/urges to use. Psychoeducation provided regarding risky alcohol use and recommended levels for low-risk drinking. He was receptive to this education and reflects that his etoh use has increased since no longer working and he often drinks when bored. Set goal to reduce etoh use by next visit.   Discussed risks of being on dual SSRI and patient expresses desire to remain on Prozac and proceed with taper of Zoloft. Instructed to decrease to 100 mg daily for 1 week followed by 50 mg daily for 1 week and then stop. He demonstrated understanding and was able to repeat instructions back to provider.   Visit Diagnosis:    ICD-10-CM   1. Bipolar 1 disorder (HCC)  F31.9 FLUoxetine (PROZAC) 20 MG capsule    risperidone (RISPERDAL) 4 MG tablet    trazodone (DESYREL) 300 MG tablet    2. Anxiety state  F41.1 busPIRone (BUSPAR) 7.5 MG tablet      Past Psychiatric History:  Diagnoses: Historical diagnosis of bipolar 1 disorder Medication trials: Zoloft Substance use:  -- Etoh: 6 beers daily; denies history of withdrawal  -- Cannabis: 3-4 times weekly  -- Denies use of illicit drugs  Past Medical History:  Past Medical History:  Diagnosis Date   Anal condyloma    Anxiety    Blood dyscrasia    HIV   Chronic back pain    Depression    DJD (degenerative joint disease)    Gastric ulcer    GERD (gastroesophageal reflux disease)    Headache(784.0)    Hiatal hernia    HIV infection ([place])    Hyperplastic colon polyp    Hypertension    Internal hemorrhoids     Past Surgical History:  Procedure Laterality Date   ESOPHAGOGASTRODUODENOSCOPY  03/27/2012   Procedure: ESOPHAGOGASTRODUODENOSCOPY (EGD);  Surgeon:  Lear Ng, MD;  Location: [facility];  Service: Endoscopy;  Laterality: N/A;   IRRIGATION AND DEBRIDEMENT ABSCESS N/A 03/13/2015   Procedure: IRRIGATION AND DEBRIDEMENT ABSCESS;  Surgeon: Johnathan Hausen, MD;  Location: [facility];  Service: General;  Laterality: N/A;   WISDOM TOOTH EXTRACTION      Family Psychiatric History: denies  Family History:  Family History  Problem Relation Age of Onset   Cancer Mother        laryngeal   Pneumonia Father    Diabetes Father    Hypertension Sister    Crohn's disease Brother    Crohn's disease Maternal Grandmother    Cancer Maternal Grandmother        patient unsure of type   Colon cancer Maternal Grandfather     Social History:  Social History   Socioeconomic History   Marital status: Divorced    Spouse name: Not on file   Number of children: 1   Years of education: Not on file   Highest education level: Not on file  Occupational History   Occupation: Disabled  Tobacco Use   Smoking status: Every Day    Packs/day: 1.00    Types: Cigarettes   Smokeless tobacco: Never   Tobacco comments:    not ready to  quit.  Vaping Use   Vaping Use: Never used  Substance and Sexual Activity   Alcohol use: Yes    Comment: daily   Drug use: Yes    Frequency: 4.0 times per week    Types: Marijuana   Sexual activity: Not Currently    Partners: Male    Comment: declined condoms  Other Topics Concern   Not on file  Social History Narrative   Not on file   Social Determinants of Health   Financial Resource Strain: Not on file  Food Insecurity: Not on file  Transportation Needs: Not on file  Physical Activity: Not on file  Stress: Not on file  Social Connections: Not on file    Allergies:  Allergies  Allergen Reactions   Chantix [Varenicline] Other (See Comments)    Bad dreams   Oxycodone Itching    Current Medications: Current Outpatient Medications  Medication Sig Dispense Refill   albuterol (VENTOLIN HFA) 108 (90  Base) MCG/ACT inhaler Inhale 2 puffs into the lungs every 6 (six) hours as needed for wheezing or shortness of breath. 18 g 1   busPIRone (BUSPAR) 7.5 MG tablet Take 1 tablet (7.5 mg total) by mouth at bedtime. 30 tablet 2   darunavir (PREZISTA) 800 MG tablet Take 1 tablet (800 mg total) by mouth daily. 30 tablet 11   diltiazem 2 % GEL Apply 1 Application topically 2 (two) times daily. 30 g 1   diphenoxylate-atropine (LOMOTIL) 2.5-0.025 MG tablet Take 1 tablet by mouth 3 (three) times daily as needed for diarrhea or loose stools. 60 tablet 2   elvitegravir-cobicistat-emtricitabine-tenofovir (GENVOYA) 150-150-200-10 MG TABS tablet Take one tab daily 30 tablet 11   FLUoxetine (PROZAC) 20 MG capsule Take 1 capsule (20 mg total) by mouth daily. 30 capsule 2   risperidone (RISPERDAL) 4 MG tablet Take 1 tablet (4 mg total) by mouth at bedtime. 30 tablet 2   trazodone (DESYREL) 300 MG tablet Take 1 tablet (300 mg total) by mouth at bedtime. 30 tablet 2   valACYclovir (VALTREX) 500 MG tablet TAKE 1 TABLET BY MOUTH TWICE DAILY 14 tablet 5   No current facility-administered medications for this visit.    ROS: Endorses recent episodes of diarrhea being managed by gastroenterology  Objective:  Psychiatric Specialty Exam: There were no vitals taken for this visit.There is no height or weight on file to calculate BMI.  General Appearance: Casual and Fairly Groomed  Eye Contact:  Good  Speech:  Clear and Coherent and Normal Rate  Volume:  Normal  Mood:   "good"  Affect:   Euthymic  Thought Content:  Denies AVH; IOR; paranoia    Suicidal Thoughts:  No  Homicidal Thoughts:  No  Thought Process:  Goal Directed and Linear  Orientation:  Full (Time, Place, and Person)    Memory:   Grossly intact  Judgment:  Good  Insight:  Good  Concentration:  Concentration: Good  Recall:  NA  Fund of Knowledge: Good  Language: Good  Psychomotor Activity:  Normal  Akathisia:  No  AIMS (if indicated): not done   Assets:  Communication Skills Desire for Improvement Leisure Time Physical Health Social Support  ADL's:  Intact  Cognition: WNL  Sleep:  Good   PE: General: sits comfortably in view of camera; no acute distress  Pulm: no increased work of breathing on room air  MSK: all extremity movements appear intact  Neuro: no focal neurological deficits observed  Gait & Station: unable to assess by  video    Metabolic Disorder Labs: Lab Results  Component Value Date   HGBA1C 5.6 06/11/2021   MPG 117 (H) 11/16/2013   No results found for: "PROLACTIN" Lab Results  Component Value Date   CHOL 194 04/06/2022   TRIG 169 (H) 04/06/2022   HDL 68 04/06/2022   CHOLHDL 2.9 04/06/2022   VLDL 28 05/12/2016   LDLCALC 97 04/06/2022   LDLCALC 110 (H) 06/11/2021   Lab Results  Component Value Date   TSH 1.150 09/01/2017   TSH 1.080 01/27/2017    Therapeutic Level Labs: No results found for: "LITHIUM" No results found for: "VALPROATE" No results found for: "CBMZ"  Screenings:  GAD-7    Flowsheet Row Video Visit from 03/18/2022 in [facility] Video Visit from 11/04/2021 in [facility] Office Visit from 10/13/2021 in [facility] at [facility] Visit from 06/11/2021 in [facility] Video Visit from 05/01/2021 in [facility]  Total GAD-7 Score '16 11 6 '$ 0 4      PHQ2-9    Flowsheet Row Video Visit from 03/18/2022 in [facility] Office Visit from 02/12/2022 in [facility] for Infectious Disease Video Visit from 11/04/2021 in [facility] Office Visit from 10/13/2021 in [facility] at [facility] Visit from 07/21/2021 in [facility] for Infectious Disease  PHQ-2 Total Score '5 1 3 1 '$ 0  PHQ-9 Total Score 20 -- 13 9 --      Flowsheet Row Video Visit from 03/18/2022 in [facility] Video Visit from 11/04/2021 in [facility] Video Visit from 05/01/2021 in [facility] [place] No Risk No Risk No Risk       Collaboration of Care: Collaboration of Care: Medication Management AEB ongoing medication management, Psychiatrist AEB established with this provider, and Other referral to CM  Patient/Guardian was advised Release of Information must be obtained prior to any record release in order to collaborate their care with an outside provider. Patient/Guardian was advised if they have not already done so to contact the registration department to sign all necessary forms in order for [place] to release information regarding their care.   Consent: Patient/Guardian gives verbal consent for treatment and assignment of benefits for services provided during this visit. Patient/Guardian expressed understanding and agreed to proceed.   Televisit via video: I connected with patient on 05/05/22 at  9:00 AM EST by a video enabled telemedicine application and verified that I am speaking with the correct person using two identifiers.  Location: Patient: home address in [place] Provider: Remote office in    I discussed the limitations of evaluation and management by telemedicine and the availability of in person appointments. The patient expressed understanding and agreed to proceed.  I discussed the assessment and treatment plan with the patient. The patient was provided an opportunity to ask questions and all were answered. The patient agreed with the plan and demonstrated an understanding of the instructions.   The patient was advised to call back or seek an in-person evaluation if the symptoms worsen or if the condition fails to improve as anticipated.  I provided 45 minutes of non-face-to-face time during this encounter.  [place] A  05/05/2022, 5:02 PM

## 2022-05-04 NOTE — Patient Instructions (Signed)
Thank you for attending your appointment today.  -- DECREASE Zoloft to 100 mg daily for 1 week followed by 50 mg for 1 week then STOP -- Continue other medications as prescribed. -- Work on reducing your alcohol use: a good goal is no more than 2 beers per day  Please do not make any changes to medications without first discussing with your provider. If you are experiencing a psychiatric emergency, please call 911 or present to your nearest emergency department. Additional crisis, medication management, and therapy resources are included below.  [facility]  [address] [place], [place] [address] [phone] WALK-IN URGENT CARE 24/7 FOR ANYONE [address] [place], [place]  [phone] Fax: [phone] [email or web address] *Interpreters available *Accepts all insurance and uninsured for Urgent Care needs *Accepts Medicaid and uninsured for outpatient treatment (below)      ONLY FOR [facility]  Below:    Outpatient New Patient Assessment/Therapy Walk-ins:        Monday -Thursday 8am until slots are full.        Every Friday 1pm-4pm  (first come, first served)                   New Patient Psychiatry/Medication Management        Monday-Friday 8am-11am (first come, first served)               For all walk-ins we ask that you arrive by 7:15am, because patients will be seen in the order of arrival.

## 2022-05-05 ENCOUNTER — Telehealth (INDEPENDENT_AMBULATORY_CARE_PROVIDER_SITE_OTHER): Payer: Commercial Managed Care - HMO | Admitting: Psychiatry

## 2022-05-05 ENCOUNTER — Encounter (HOSPITAL_COMMUNITY): Payer: Self-pay | Admitting: Psychiatry

## 2022-05-05 DIAGNOSIS — F319 Bipolar disorder, unspecified: Secondary | ICD-10-CM | POA: Diagnosis not present

## 2022-05-05 DIAGNOSIS — F411 Generalized anxiety disorder: Secondary | ICD-10-CM

## 2022-05-05 MED ORDER — RISPERIDONE 4 MG PO TABS: 4.0000 mg | ORAL_TABLET | Freq: Every evening | ORAL | 2 refills | Status: DC

## 2022-05-05 MED ORDER — TRAZODONE HCL 300 MG PO TABS: 300.0000 mg | ORAL_TABLET | Freq: Every evening | ORAL | 2 refills | Status: DC

## 2022-05-05 MED ORDER — BUSPIRONE HCL 7.5 MG PO TABS: 7.5000 mg | ORAL_TABLET | Freq: Every evening | ORAL | 2 refills | Status: DC

## 2022-05-05 MED ORDER — FLUOXETINE HCL 20 MG PO CAPS: 20.0000 mg | ORAL_CAPSULE | Freq: Every day | ORAL | 2 refills | Status: DC

## 2022-05-06 ENCOUNTER — Other Ambulatory Visit (HOSPITAL_COMMUNITY): Payer: Self-pay | Admitting: Psychiatry

## 2022-05-06 DIAGNOSIS — F319 Bipolar disorder, unspecified: Secondary | ICD-10-CM

## 2022-05-13 ENCOUNTER — Telehealth (HOSPITAL_COMMUNITY): Payer: Commercial Managed Care - HMO | Admitting: Psychiatry

## 2022-05-26 ENCOUNTER — Encounter: Payer: Self-pay | Admitting: Internal Medicine

## 2022-05-26 ENCOUNTER — Ambulatory Visit (INDEPENDENT_AMBULATORY_CARE_PROVIDER_SITE_OTHER): Payer: Commercial Managed Care - HMO | Admitting: Internal Medicine

## 2022-05-26 VITALS — BP 148/80 | HR 68 | Ht 68.0 in | Wt 241.8 lb

## 2022-05-26 DIAGNOSIS — Z86010 Personal history of colonic polyps: Secondary | ICD-10-CM

## 2022-05-26 DIAGNOSIS — K602 Anal fissure, unspecified: Secondary | ICD-10-CM

## 2022-05-26 DIAGNOSIS — K580 Irritable bowel syndrome with diarrhea: Secondary | ICD-10-CM | POA: Diagnosis not present

## 2022-05-26 MED ORDER — VIBERZI 100 MG PO TABS: ORAL_TABLET | ORAL | 3 refills | Status: DC

## 2022-05-26 NOTE — Progress Notes (Signed)
Subjective:    Patient ID: Shawn Brooks, male    DOB: 04-18-1976, 46 y.o.   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 pylori negative gastritis and chronic diarrhea who is here for follow-up.  He also has a history of HIV, bipolar disorder and seizure disorder.  He is here alone today and was last seen on 03/16/2022.  At his last visit we started him on diltiazem gel for anal fissure.  He reports this has been very effective and his anal fissure is healed.  He has no further rectal bleeding or pain with passing stool.  He is still however having diarrhea.  We checked a [facility] GI stool panel and this was negative.  He has been using Lomotil which is effective but causes severely dry mouth making it hard for him to take on a regular basis.  It also results in no bowel movement for a day or so before the diarrhea returns.  The diarrhea occurs at least 4 days a week there is often urgency and he has had multiple accidents at work.  It limits his function and he avoids going out for social events because of fear of loose stool.   Review of Systems As per HPI, otherwise negative  Current Medications, Allergies, Past Medical History, Past Surgical History, Family History and Social History were reviewed in [employer] record.    Objective:   Physical Exam BP (!) 148/80 (BP Location: Left Arm, Patient Position: Sitting, Cuff Size: Large)   Pulse 68   Ht '5\' 8"'$  (1.727 m)   Wt 241 lb 12.8 oz (109.7 kg)   SpO2 98%   BMI 36.77 kg/m  Gen: awake, alert, NAD HEENT: anicteric Neuro: nonfocal     Latest Ref Rng & Units 10/13/2021    3:38 PM 06/11/2021    4:00 PM 08/09/2019    9:22 AM  CBC  WBC 3.4 - 10.8 x10E3/uL 9.7  9.2  12.9   Hemoglobin 13.0 - 17.7 g/dL 15.5  15.2  15.3   Hematocrit 37.5  - 51.0 % 43.9  42.3  43.9   Platelets 150 - 450 x10E3/uL 236  231  200    CMP     Component Value Date/Time   NA 137 02/12/2022 0906   NA 135 10/13/2021 1538   K 4.2 02/12/2022 0906   CL 103 02/12/2022 0906   CO2 27 02/12/2022 0906   GLUCOSE 93 02/12/2022 0906   BUN 6 (L) 02/12/2022 0906   BUN 8 10/13/2021 1538   CREATININE 1.24 02/12/2022 0906   CALCIUM 10.0 02/12/2022 0906   PROT 7.6 02/12/2022 0906   PROT 7.7 06/11/2021 1600   ALBUMIN 5.0 06/11/2021 1600   AST 23 02/12/2022 0906   ALT 17 02/12/2022 0906   ALKPHOS 107 06/11/2021 1600   BILITOT 0.3 02/12/2022 0906   BILITOT 0.2 06/11/2021 1600   GFRNONAA 64 07/24/2020 0914   GFRAA 74 07/24/2020 0914    [facility] gastrointestinal panel negative     Assessment & Plan:   46 year old male with a history of internal hemorrhoids with prior banding in 2019, anal fissure, 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 pylori negative gastritis and chronic diarrhea who is here for follow-up.    Chronic diarrhea/IBS --consistent with irritable bowel.  Infectious panel negative.  Lomotil is causing extreme dry mouth.  Trial of Viberzi 100 mg 1-2 times daily.  We reviewed the risks including small risk of pancreatitis though typically only inpatient status postcholecystectomy.  His gallbladder is in place.  Should he develop constipation or abdominal pain or other troublesome GI symptoms he should let me know -- Discontinue Lomotil due to severe dry mouth -- Viberzi 100 mg 1-2 times daily for IBS-D  2.  Anal fissure with bleeding --healed with diltiazem.  Can use in the future if needed  3.  Internal hemorrhoids --previously symptomatic but not so now.  Prior hemorrhoidal banding.  Repeat banding could be considered in the future if these become a problem.  4.  History of adenomatous colon polyp --surveillance colonoscopy recommended next year in October 2024  Follow-up in February 2024  30  minutes total spent today including patient facing time, coordination of care, reviewing medical history/procedures/pertinent radiology studies, and documentation of the encounter.

## 2022-05-26 NOTE — Patient Instructions (Addendum)
_______________________________________________________  If you are age 48 or older, your body mass index should be between 23-30. Your Body mass index is 36.77 kg/m. If this is out of the aforementioned range listed, please consider follow up with your Primary Care Provider.  If you are age 75 or younger, your body mass index should be between 19-25. Your Body mass index is 36.77 kg/m. If this is out of the aformentioned range listed, please consider follow up with your Primary Care Provider.   ________________________________________________________  The [place] [facility] providers would like to encourage you to use [facility] to communicate with providers for non-urgent requests or questions.  Due to long hold times on the telephone, sending your provider a message by [facility] may be a faster and more efficient way to get a response.  Please allow 48 business hours for a response.  Please remember that this is for non-urgent requests.  _______________________________________________________  DISCONTUE:  Lomotil  START: Viberzi 1 tablet one to two times daily (samples given).  You are scheduled to follow up on 08-19-22 at 11:00am   Thank you for entrusting me with your care and choosing [facility].  Dr Hilarie Fredrickson

## 2022-05-28 ENCOUNTER — Other Ambulatory Visit (HOSPITAL_COMMUNITY): Payer: Self-pay | Admitting: Physician Assistant

## 2022-05-28 DIAGNOSIS — F319 Bipolar disorder, unspecified: Secondary | ICD-10-CM

## 2022-05-31 ENCOUNTER — Other Ambulatory Visit (HOSPITAL_COMMUNITY): Payer: Self-pay

## 2022-05-31 ENCOUNTER — Telehealth: Payer: Self-pay | Admitting: Pharmacy Technician

## 2022-05-31 NOTE — Telephone Encounter (Signed)
Patient Advocate Encounter  Received notification from [facility] that prior authorization for VIBERZI '100MG'$  is required.   PA submitted on 12.4.23 Key [id number] Status is pending

## 2022-06-01 ENCOUNTER — Other Ambulatory Visit (HOSPITAL_COMMUNITY): Payer: Self-pay

## 2022-06-01 NOTE — Telephone Encounter (Signed)
Patient Advocate Encounter  Prior Authorization for VIBERZI '100MG'$  has been approved.    PA# Ticket #: [id number] Effective dates: 12.4.23 through [place] P: [phone] F: [phone]

## 2022-06-02 NOTE — Telephone Encounter (Signed)
Can you find out if the correct prior authorization was performed for Viberzi Apparently it was effective for him but cost prohibitive Let me know the outcome

## 2022-06-08 ENCOUNTER — Other Ambulatory Visit (HOSPITAL_COMMUNITY): Payer: Self-pay

## 2022-06-29 ENCOUNTER — Other Ambulatory Visit (HOSPITAL_COMMUNITY): Payer: Self-pay

## 2022-07-01 ENCOUNTER — Other Ambulatory Visit (HOSPITAL_COMMUNITY): Payer: Self-pay

## 2022-07-09 ENCOUNTER — Telehealth (HOSPITAL_COMMUNITY): Payer: Commercial Managed Care - HMO | Admitting: Psychiatry

## 2022-07-15 ENCOUNTER — Encounter: Payer: Self-pay | Admitting: Internal Medicine

## 2022-07-15 ENCOUNTER — Ambulatory Visit (INDEPENDENT_AMBULATORY_CARE_PROVIDER_SITE_OTHER): Payer: Commercial Managed Care - HMO | Admitting: Student

## 2022-07-15 ENCOUNTER — Encounter (HOSPITAL_COMMUNITY): Payer: Self-pay | Admitting: Student

## 2022-07-15 VITALS — BP 121/81 | HR 96 | Wt 238.0 lb

## 2022-07-15 DIAGNOSIS — F411 Generalized anxiety disorder: Secondary | ICD-10-CM | POA: Diagnosis not present

## 2022-07-15 DIAGNOSIS — F319 Bipolar disorder, unspecified: Secondary | ICD-10-CM

## 2022-07-15 DIAGNOSIS — Z720 Tobacco use: Secondary | ICD-10-CM | POA: Diagnosis not present

## 2022-07-15 MED ORDER — BUSPIRONE HCL 7.5 MG PO TABS: 7.5000 mg | ORAL_TABLET | Freq: Every evening | ORAL | 2 refills | Status: AC

## 2022-07-15 MED ORDER — RISPERIDONE 4 MG PO TABS: 4.0000 mg | ORAL_TABLET | Freq: Every evening | ORAL | 2 refills | Status: DC

## 2022-07-15 MED ORDER — FLUOXETINE HCL 20 MG PO CAPS: 20.0000 mg | ORAL_CAPSULE | Freq: Every day | ORAL | 2 refills | Status: DC

## 2022-07-15 MED ORDER — TRAZODONE HCL 300 MG PO TABS: 300.0000 mg | ORAL_TABLET | Freq: Every evening | ORAL | 2 refills | Status: DC

## 2022-07-15 NOTE — Progress Notes (Signed)
[place] MD Outpatient Progress Note  07/15/2022 2:37 PM Shawn Brooks  MRN:  [id number]  Assessment:  Shawn Brooks presents for follow-up evaluation in-person. Today, 07/15/22, patient reports sleeping improved.   Identifying Information: Shawn Brooks is a 47 y.o. male with historical diagnosis of bipolar 1 disorder history, HIV on HAART threapy, HLD, and Vitamin D deficiency who is an established patient with [facility] for medication management.   Plan:  # Historical diagnosis of bipolar 1 disorder Past medication trials: Zoloft Status of problem: stable Interventions: -- Continue risperidone 4 mg at bedtime -- Discontinued zoloft after taper last visit -- Continue Prozac 20 mg daily -- Continue Buspar 7.5 mg nightly -- Will refer patient to CM for assistance with community resources and disability process   # Insomnia Past medication trials:  Status of problem: stable Interventions: -- Continue trazodone 300 mg nightly   # EtOH use Past medication trials: none Status of problem: improving Interventions: -- Decreased to 2-3 beers per day -- Psychoeducation provided regarding risks of dependency, tolerance, and withdrawal as well as safe limits of use; patient expresses intent to continue to reduce use  # Metabolic monitoring Interventions: -- SGA: lipid profile revealing for elevated TG on 04/06/22 (followed by PCP); Hgb A1c of 5.6 on 06/12/21 (will need repeat A1c at next PCP appointment in April 2024)   Patient was given contact information for behavioral health clinic and was instructed to call 911 for emergencies.   Subjective:  Chief Complaint:  Chief Complaint  Patient presents with   Depression    Interval History:  Patient reports that his mood has been appropriately controlled.  He had an event where he got into a disagreement with friend and they discontinued their friendship.  Patient states that he felt that he tolerated it  well and did not become verbally aggressive or physically aggressive.  He states that this is a significant improvement than before.  He continues to report appropriate sleep and denies any major changes to appetite.  He continues to wait for his disability application to process.  He denies anhedonia or problems with depressive mood or manic symptoms.  He denies SI/HI/AVH today.  He states that he has reduced his beer intake to 2-3 beers.  He does plan to discontinue all alcohol use.  He states that he only started alcohol because he had more idle time in the past year.  He states that he is concerned about weight gain since he is slowly cutting back.  He denies any major alcohol withdrawal symptoms.  He continues to be compliant with medications and denies any somatic complaints. He states he will have a PCP appointment in April 2024 which I encouraged he bring up getting A1c at that time to minimize blood draws. He is agreeable and had not further questions at this time.  Visit Diagnosis:    ICD-10-CM   1. Anxiety state  F41.1     2. Bipolar 1 disorder (HCC)  F31.9       Past Psychiatric History:  Diagnoses: Historical diagnosis of bipolar 1 disorder Medication trials: Zoloft Substance use:             -- Etoh: 6 beers daily; denies history of withdrawal             -- Cannabis: 3-4 times weekly             -- Denies use of illicit drugs  Past Medical History:  Past  Medical History:  Diagnosis Date   Anal condyloma    Anxiety    Blood dyscrasia    HIV   Chronic back pain    Depression    DJD (degenerative joint disease)    Gastric ulcer    GERD (gastroesophageal reflux disease)    Headache(784.0)    Hiatal hernia    HIV infection ([place])    Hyperplastic colon polyp    Hypertension    Internal hemorrhoids     Past Surgical History:  Procedure Laterality Date   ESOPHAGOGASTRODUODENOSCOPY  03/27/2012   Procedure: ESOPHAGOGASTRODUODENOSCOPY (EGD);  Surgeon: Lear Ng,  MD;  Location: [facility];  Service: Endoscopy;  Laterality: N/A;   IRRIGATION AND DEBRIDEMENT ABSCESS N/A 03/13/2015   Procedure: IRRIGATION AND DEBRIDEMENT ABSCESS;  Surgeon: Johnathan Hausen, MD;  Location: [facility];  Service: General;  Laterality: N/A;   WISDOM TOOTH EXTRACTION      Family Psychiatric History: denies  Family History:  Family History  Problem Relation Age of Onset   Cancer Mother        laryngeal   Pneumonia Father    Diabetes Father    Hypertension Sister    Crohn's disease Brother    Crohn's disease Maternal Grandmother    Cancer Maternal Grandmother        patient unsure of type   Colon cancer Maternal Grandfather     Social History:  Social History   Socioeconomic History   Marital status: Divorced    Spouse name: Not on file   Number of children: 1   Years of education: Not on file   Highest education level: Not on file  Occupational History   Occupation: Disabled  Tobacco Use   Smoking status: Every Day    Packs/day: 1.00    Types: Cigarettes   Smokeless tobacco: Never   Tobacco comments:    not ready to quit.  Vaping Use   Vaping Use: Never used  Substance and Sexual Activity   Alcohol use: Yes    Comment: daily   Drug use: Yes    Frequency: 4.0 times per week    Types: Marijuana   Sexual activity: Not Currently    Partners: Male    Comment: declined condoms  Other Topics Concern   Not on file  Social History Narrative   Not on file   Social Determinants of Health   Financial Resource Strain: Not on file  Food Insecurity: Not on file  Transportation Needs: Not on file  Physical Activity: Not on file  Stress: Not on file  Social Connections: Not on file    Allergies:  Allergies  Allergen Reactions   Chantix [Varenicline] Other (See Comments)    Bad dreams   Oxycodone Itching    Current Medications: Current Outpatient Medications  Medication Sig Dispense Refill   albuterol (VENTOLIN HFA) 108 (90 Base) MCG/ACT inhaler  Inhale 2 puffs into the lungs every 6 (six) hours as needed for wheezing or shortness of breath. 18 g 1   busPIRone (BUSPAR) 7.5 MG tablet Take 1 tablet (7.5 mg total) by mouth at bedtime. 30 tablet 2   darunavir (PREZISTA) 800 MG tablet Take 1 tablet (800 mg total) by mouth daily. 30 tablet 11   diphenoxylate-atropine (LOMOTIL) 2.5-0.025 MG tablet Take 1 tablet by mouth 3 (three) times daily as needed for diarrhea or loose stools. 60 tablet 2   Eluxadoline (VIBERZI) 100 MG TABS Take 1 tablet one to two times daily 60 tablet 3  elvitegravir-cobicistat-emtricitabine-tenofovir (GENVOYA) 150-150-200-10 MG TABS tablet Take one tab daily 30 tablet 11   FLUoxetine (PROZAC) 20 MG capsule Take 1 capsule (20 mg total) by mouth daily. 30 capsule 2   risperidone (RISPERDAL) 4 MG tablet Take 1 tablet (4 mg total) by mouth at bedtime. 30 tablet 2   trazodone (DESYREL) 300 MG tablet Take 1 tablet (300 mg total) by mouth at bedtime. 30 tablet 2   valACYclovir (VALTREX) 500 MG tablet TAKE 1 TABLET BY MOUTH TWICE DAILY 14 tablet 5   No current facility-administered medications for this visit.    ROS: Review of Systems  Constitutional:  Negative for appetite change, chills and diaphoresis.  HENT:  Negative for drooling.   Cardiovascular:  Negative for chest pain, palpitations and leg swelling.  Gastrointestinal:  Negative for abdominal distention, abdominal pain, constipation, diarrhea, nausea and vomiting.  Genitourinary:  Negative for difficulty urinating and dysuria.  Musculoskeletal:  Negative for back pain and gait problem.  Neurological:  Negative for dizziness, light-headedness, numbness and headaches.  Psychiatric/Behavioral:  Negative for agitation, behavioral problems, confusion, decreased concentration, dysphoric mood, hallucinations, self-injury, sleep disturbance and suicidal ideas. The patient is not nervous/anxious and is not hyperactive.     Objective:  Psychiatric Specialty Exam: Blood  pressure 121/81, pulse 96, weight 238 lb (108 kg).Body mass index is 36.19 kg/m.  General Appearance: Casual  Eye Contact:  Good  Speech:  Clear and Coherent  Volume:  Normal  Mood:  Euthymic  Affect:  Appropriate and Congruent  Thought Process:  Coherent, Goal Directed, and Linear  Orientation:  Full (Time, Place, and Person)  Thought Content: Logical   Suicidal Thoughts:  No  Homicidal Thoughts:  No  Memory:  Remote;   Good  Judgment:  Fair  Insight:  Fair  Psychomotor Activity:  Normal  Concentration:  Concentration: Good and Attention Span: Good              Assets:  Communication Skills Desire for Improvement Housing Leisure Time Resilience Social Support Talents/Skills Transportation  ADL's:  Intact  Cognition: WNL  Sleep:  Good   PE: General: well-appearing; no acute distress  Pulm: no increased work of breathing on room air  Strength & Muscle Tone: within normal limits Neuro: no focal neurological deficits observed  Gait & Station: normal  Metabolic Disorder Labs: Lab Results  Component Value Date   HGBA1C 5.6 06/11/2021   MPG 117 (H) 11/16/2013   No results found for: "PROLACTIN" Lab Results  Component Value Date   CHOL 194 04/06/2022   TRIG 169 (H) 04/06/2022   HDL 68 04/06/2022   CHOLHDL 2.9 04/06/2022   VLDL 28 05/12/2016   LDLCALC 97 04/06/2022   LDLCALC 110 (H) 06/11/2021   Lab Results  Component Value Date   TSH 1.150 09/01/2017   TSH 1.080 01/27/2017    Therapeutic Level Labs: No results found for: "LITHIUM" No results found for: "VALPROATE" No results found for: "CBMZ"  Screenings: GAD-7    Flowsheet Row Video Visit from 03/18/2022 in [facility] Video Visit from 11/04/2021 in [facility] Office Visit from 10/13/2021 in [facility] at [place] Visit from 06/11/2021 in [place] Video Visit from 05/01/2021 in [facility]  Total GAD-7 Score '16 11 6 '$ 0 4      PHQ2-9    Flowsheet Row Video Visit from 03/18/2022 in [facility] Office Visit from 02/12/2022  in [facility]ious Disease Video Visit from 11/04/2021 in [facility] Office Visit from 10/13/2021 in Primary Care at [facility] Office Visit from 07/21/2021 in [facility] for Infectious Disease  PHQ-2 Total Score '5 1 3 1 '$ 0  PHQ-9 Total Score 20 -- 13 9 --      Flowsheet Row Video Visit from 03/18/2022 in [facility] Video Visit from 11/04/2021 in [facility] Video Visit from 05/01/2021 in [place] No Risk No Risk No Risk       Collaboration of Care: Collaboration of Care:   Patient/Guardian was advised Release of Information must be obtained prior to any record release in order to collaborate their care with an outside provider. Patient/Guardian was advised if they have not already done so to contact the registration department to sign all necessary forms in order for [place] to release information regarding their care.   Consent: Patient/Guardian gives verbal consent for treatment and assignment of benefits for services provided during this visit. Patient/Guardian expressed understanding and agreed to proceed.   A total of 30 minutes was spent involved in face to face clinical care, chart review, and documentation.   France Ravens, MD 07/15/2022, 2:37 PM

## 2022-07-16 ENCOUNTER — Telehealth: Payer: Self-pay | Admitting: Pharmacy Technician

## 2022-07-16 ENCOUNTER — Other Ambulatory Visit (HOSPITAL_COMMUNITY): Payer: Self-pay

## 2022-07-16 NOTE — Telephone Encounter (Signed)
Patient Advocate Encounter  Received notification from [place] that prior authorization for VIBERZI '100MG'$  is required.   PA submitted on 1.19.24 Key [id number] Status is pending

## 2022-07-16 NOTE — Telephone Encounter (Signed)
Test billing with pt new insurance does show that it required a PA. Previous encounter was in 05/2022 and pt was able to go through [place] only. PA has been submitted, approved, and telephone encounter has been created. [place] on pt profile and was unable to speak to someone regarding processing. However there is an active PA on file for both new insurance and [employer].

## 2022-07-16 NOTE — Telephone Encounter (Signed)
Patient Advocate Encounter  Prior Authorization for Lennox Pippins has been approved.    PA# [id number] Effective dates: 1.19.24 through 1.18.25

## 2022-07-23 ENCOUNTER — Other Ambulatory Visit: Payer: Self-pay

## 2022-07-23 DIAGNOSIS — B20 Human immunodeficiency virus [HIV] disease: Secondary | ICD-10-CM

## 2022-07-23 MED ORDER — GENVOYA 150-150-200-10 MG PO TABS: ORAL_TABLET | ORAL | 2 refills | Status: DC

## 2022-07-23 MED ORDER — DARUNAVIR 800 MG PO TABS: 800.0000 mg | ORAL_TABLET | Freq: Every day | ORAL | 2 refills | Status: DC

## 2022-08-13 ENCOUNTER — Telehealth (HOSPITAL_COMMUNITY): Payer: Self-pay

## 2022-08-13 NOTE — Telephone Encounter (Signed)
Medicaiton management -Prior authorization initiated online with CoverMyMeds and sent to patient's [facility] Medicaid pending decision for his ordered Risperdone 4 mg, one a day.

## 2022-08-19 ENCOUNTER — Encounter: Payer: Self-pay | Admitting: Internal Medicine

## 2022-08-19 ENCOUNTER — Ambulatory Visit: Payer: Medicaid Other | Admitting: Internal Medicine

## 2022-08-19 VITALS — BP 110/70 | HR 91 | Ht 68.0 in | Wt 242.0 lb

## 2022-08-19 DIAGNOSIS — K580 Irritable bowel syndrome with diarrhea: Secondary | ICD-10-CM

## 2022-08-19 DIAGNOSIS — K602 Anal fissure, unspecified: Secondary | ICD-10-CM | POA: Diagnosis not present

## 2022-08-19 DIAGNOSIS — Z86010 Personal history of colonic polyps: Secondary | ICD-10-CM | POA: Diagnosis not present

## 2022-08-19 NOTE — Progress Notes (Signed)
   Subjective:    Patient ID: Shawn Brooks, male    DOB: 04/03/1976, 47 y.o.   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 pylori negative gastritis and chronic diarrhea who is here for follow-up.  He also has a history of HIV, bipolar disorder and seizure disorder.  He is here alone today and was last seen on 05/26/2022.  He reports that his Viberzi was cost prohibitive but he has changed insurance and now has Medicaid.  He is unsure if the $700 that he was told this medicine costs was on his prior commercial based insurance or Medicaid.  He did not try the samples because he did not want to not be able to get the medicine if it was not to be approved.  He is still having diarrhea.  Lomotil while helpful causes severe dry mouth.  Stools can be urgent.  Does limit function.  Intermittent scant red blood per rectum with wiping.  Anal fissure pain is improved with diltiazem gel.  He has had 2 days of crampy mid and lower abdominal pain relieved by defecation since follow-up.   Review of Systems As per HPI, otherwise negative  Current Medications, Allergies, Past Medical History, Past Surgical History, Family History and Social History were reviewed in [employer] record.    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 pylori negative gastritis and chronic diarrhea who is here for follow-up.   IBS-D with chronic diarrhea --my CMA called and Viberzi was not  processed with Medicaid and now we know this will only be $4 per month.  Hopefully this will work for him and he can try the samples given previously before he purchases this -- Viberzi 100 mg twice daily; trial once daily at first and if tolerating and if needed can go to twice daily -- Discontinue Lomotil due to severe dry mouth  2.  History of anal fissure with bleeding --resolved with diltiazem gel; diltiazem gel as needed  3.  History of adenomatous colon polyps and AIN --surveillance colonoscopy October this year  20 minutes total spent today including patient facing time, coordination of care, reviewing medical history/procedures/pertinent radiology studies, and documentation of the encounter.

## 2022-08-19 NOTE — Patient Instructions (Signed)
Follow up as needed. _______________________________________________________  If your blood pressure at your visit was 140/90 or greater, please contact your primary care physician to follow up on this.  _______________________________________________________  If you are age 49 or older, your body mass index should be between 23-30. Your Body mass index is 36.8 kg/m. If this is out of the aforementioned range listed, please consider follow up with your Primary Care Provider.  If you are age 83 or younger, your body mass index should be between 19-25. Your Body mass index is 36.8 kg/m. If this is out of the aformentioned range listed, please consider follow up with your Primary Care Provider.   ________________________________________________________  The [place] [facility] providers would like to encourage you to use [facility] to communicate with providers for non-urgent requests or questions.  Due to long hold times on the telephone, sending your provider a message by [facility] may be a faster and more efficient way to get a response.  Please allow 48 business hours for a response.  Please remember that this is for non-urgent requests.  _______________________________________________________ Thank you for trusting me with your gastrointestinal care!   Dr Hilarie Fredrickson MD

## 2022-08-24 ENCOUNTER — Ambulatory Visit: Payer: Commercial Managed Care - HMO | Admitting: Internal Medicine

## 2022-09-07 ENCOUNTER — Ambulatory Visit (INDEPENDENT_AMBULATORY_CARE_PROVIDER_SITE_OTHER): Payer: Medicaid Other | Admitting: Internal Medicine

## 2022-09-07 ENCOUNTER — Encounter: Payer: Self-pay | Admitting: Internal Medicine

## 2022-09-07 ENCOUNTER — Other Ambulatory Visit: Payer: Self-pay

## 2022-09-07 VITALS — BP 110/75 | HR 82 | Temp 98.1°F | Resp 16 | Ht 68.0 in | Wt 242.8 lb

## 2022-09-07 DIAGNOSIS — D013 Carcinoma in situ of anus and anal canal: Secondary | ICD-10-CM

## 2022-09-07 DIAGNOSIS — B20 Human immunodeficiency virus [HIV] disease: Secondary | ICD-10-CM

## 2022-09-07 DIAGNOSIS — Z720 Tobacco use: Secondary | ICD-10-CM

## 2022-09-07 NOTE — Assessment & Plan Note (Signed)
He is doing well with his salvage regimen and no changes indicated.   Will do labs today and he can return in 6 months.

## 2022-09-07 NOTE — Progress Notes (Signed)
   Subjective:    Patient ID: Shawn Brooks, male    DOB: October 01, 1975, 47 y.o.   MRN: [id number]  HPI Shawn Brooks is here  for follow up of HIV He continues on prezista and Genvoya with no missed doses.  No issues with getting or taking his medication. He is undergoing evaluation for disability due to mental health issues and back injury issues.     Review of Systems  Constitutional:  Negative for fatigue.  Gastrointestinal:  Negative for diarrhea.  Skin:  Negative for rash.       Objective:   Physical Exam Eyes:     General: No scleral icterus. Pulmonary:     Effort: Pulmonary effort is normal.  Skin:    Findings: No rash.  Neurological:     Mental Status: He is alert.   SH: + tobacco        Assessment & Plan:

## 2022-09-07 NOTE — Assessment & Plan Note (Signed)
Counseled on the need to quit

## 2022-09-07 NOTE — Assessment & Plan Note (Signed)
Will need to refer him back to surgery for evaluation

## 2022-09-08 LAB — T-HELPER CELL (CD4) - (RCID CLINIC ONLY)
CD4 % Helper T Cell: 32 % — ABNORMAL LOW (ref 33–65)
CD4 T Cell Abs: 817 /uL (ref 400–1790)

## 2022-09-10 LAB — HIV-1 RNA QUANT-NO REFLEX-BLD
HIV 1 RNA Quant: NOT DETECTED Copies/mL
HIV-1 RNA Quant, Log: NOT DETECTED Log cps/mL

## 2022-10-07 ENCOUNTER — Encounter: Payer: Self-pay | Admitting: Internal Medicine

## 2022-10-07 ENCOUNTER — Ambulatory Visit: Payer: Medicaid Other | Attending: Internal Medicine | Admitting: Internal Medicine

## 2022-10-07 VITALS — BP 116/75 | HR 82 | Temp 98.3°F | Ht 68.0 in | Wt 239.0 lb

## 2022-10-07 DIAGNOSIS — Z6836 Body mass index (BMI) 36.0-36.9, adult: Secondary | ICD-10-CM

## 2022-10-07 DIAGNOSIS — Z8619 Personal history of other infectious and parasitic diseases: Secondary | ICD-10-CM | POA: Diagnosis not present

## 2022-10-07 DIAGNOSIS — F319 Bipolar disorder, unspecified: Secondary | ICD-10-CM | POA: Diagnosis not present

## 2022-10-07 DIAGNOSIS — F17200 Nicotine dependence, unspecified, uncomplicated: Secondary | ICD-10-CM | POA: Diagnosis not present

## 2022-10-07 DIAGNOSIS — Z21 Asymptomatic human immunodeficiency virus [HIV] infection status: Secondary | ICD-10-CM

## 2022-10-07 DIAGNOSIS — L989 Disorder of the skin and subcutaneous tissue, unspecified: Secondary | ICD-10-CM

## 2022-10-07 MED ORDER — VALACYCLOVIR HCL 500 MG PO TABS
ORAL_TABLET | ORAL | 5 refills | Status: AC
Start: 2022-10-07 — End: ?

## 2022-10-07 NOTE — Progress Notes (Signed)
Patient ID: Shawn Brooks, male    DOB: May 04, 1976  MRN: [id number]  CC: Hyperlipidemia (Mixed hyperlipidemia f/u. Med refills. /Painful bump on L foot X2-3 weeks)   Subjective: Shawn Brooks JohnWarren is a 47 y.o. male who presents for chronic ds management His concerns today include:  HIV, DJD,  hyperlipidemia, anxiety, bipolar 1  (followed by psychiatry ), tobacco dependence, vit D deficiency, numbness in hands (neg EMG 2019), chronic bilateral lower back pain , rectal fissure, internal hemorrhoids.   HL: On last visit he was not on the Crestor.  Refill was sent.  However recheck on lipid profile revealed an LDL of 97.  I had sent lab message telling him to hold off on starting it given that LDL was below goal and he was off the medicine for a while. He has done so  Anx/MDD/Bipolar 1: Followed by psychiatry.  He is on trazodone, Risperdal, Prozac (recently added 2-3 mths ago), BuSpar.  Reports doing well on his medications.  His psychiatrist wanted him to have an A1c and lipid profile checked.  Obesity: BMI of 36.  When I saw him 6 months ago, weight was 236 pounds.  Today is 239 pounds. eats once a day.  Not snacking during the day.  Drinks mainly water.   Not moving much. Has filled for disability.    HIV:  compliant with meds; followed by Dr. Luciana Axeomer reports compliance with Darrin LuisGenvoya and Prezista Request refill on Valtrex.  No recent herpes outbreak.  Tob: Still smoking about a third to half a pack a day.  Not ready to quit.  SZ; I see history of seizure on his problem list.  Patient states this occurred only once in 2015, for which his friends thought he had a seizure.  He was not put on medication.  Nothing since then  Complains of a sore bump on the inner aspect of the left foot for the past 3 to 5 months.  It has increased in size. Patient Active Problem List   Diagnosis Date Noted   Anxiety state 03/21/2022   Influenza vaccine refused 06/17/2020   Esophageal dysphagia 06/17/2020    Mixed hyperlipidemia 06/17/2020   Obesity (BMI 30.0-34.9) 06/17/2020   History of farsightedness 06/17/2020   Bipolar 1 disorder 05/01/2020   Chronic night sweats 04/10/2020   Counseled about COVID-19 virus infection 09/18/2019   Urinary frequency 12/26/2018   Medication monitoring encounter 08/08/2018   Memory problem 09/01/2017   Vitamin D deficiency 09/01/2017   Primary osteoarthritis of left knee 05/02/2017   Stage 3 chronic kidney disease 05/02/2017   Prediabetes 12/22/2016   Tobacco abuse 05/31/2016   Lumbar transverse process fracture 10/02/2015   Screening examination for sexually transmitted disease 04/04/2014   Seizures 08/30/2013   Thoracic or lumbosacral neuritis or radiculitis 01/15/2013   H/O gastrointestinal disease 01/15/2013   Lumbar radiculopathy 01/15/2013   Carcinoma in situ of anal canal 11/17/2012   Condyloma acuminatum 11/17/2012   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   AGW (anogenital warts) 11/17/2012   Hemorrhoid 09/27/2012   BRBPR (bright red blood per rectum) 07/27/2012   Prostatitis 04/12/2012   Constipation 03/09/2012   Callus 08/16/2011   Ingrown toenail 08/16/2011   Chronic low back pain 08/16/2011   Dyslipidemia 09/03/2010   Herpes simplex virus (HSV) infection 07/06/2010   Erectile dysfunction 07/06/2010   ANXIETY DEPRESSION 05/06/2010   BURSITIS, KNEE 05/06/2010   Human immunodeficiency virus (HIV) disease 04/16/2010     Current Outpatient Medications on  File Prior to Visit  Medication Sig Dispense Refill   albuterol (VENTOLIN HFA) 108 (90 Base) MCG/ACT inhaler Inhale 2 puffs into the lungs every 6 (six) hours as needed for wheezing or shortness of breath. 18 g 1   busPIRone (BUSPAR) 7.5 MG tablet Take 1 tablet (7.5 mg total) by mouth at bedtime. 30 tablet 2   darunavir (PREZISTA) 800 MG tablet Take 1 tablet (800 mg total) by mouth daily. 30 tablet 2   diphenoxylate-atropine (LOMOTIL) 2.5-0.025 MG tablet Take 1  tablet by mouth 3 (three) times daily as needed for diarrhea or loose stools. 60 tablet 2   Eluxadoline (VIBERZI) 100 MG TABS Take 1 tablet one to two times daily 60 tablet 3   elvitegravir-cobicistat-emtricitabine-tenofovir (GENVOYA) 150-150-200-10 MG TABS tablet Take one tab daily 30 tablet 2   FLUoxetine (PROZAC) 20 MG capsule Take 1 capsule (20 mg total) by mouth daily. 30 capsule 2   risperidone (RISPERDAL) 4 MG tablet Take 1 tablet (4 mg total) by mouth at bedtime. 30 tablet 2   trazodone (DESYREL) 300 MG tablet Take 1 tablet (300 mg total) by mouth at bedtime. 30 tablet 2   valACYclovir (VALTREX) 500 MG tablet TAKE 1 TABLET BY MOUTH TWICE DAILY 14 tablet 5   No current facility-administered medications on file prior to visit.    Allergies  Allergen Reactions   Chantix [Varenicline] Other (See Comments)    Bad dreams   Oxycodone Itching    Social History   Socioeconomic History   Marital status: Divorced    Spouse name: Not on file   Number of children: 1   Years of education: Not on file   Highest education level: Not on file  Occupational History   Occupation: Disabled  Tobacco Use   Smoking status: Every Day    Packs/day: 1    Types: Cigarettes   Smokeless tobacco: Never   Tobacco comments:    not ready to quit.  Vaping Use   Vaping Use: Never used  Substance and Sexual Activity   Alcohol use: Yes    Comment: daily   Drug use: Yes    Frequency: 4.0 times per week    Types: Marijuana   Sexual activity: Not Currently    Partners: Male    Comment: declined condoms  Other Topics Concern   Not on file  Social History Narrative   Not on file   Social Determinants of Health   Financial Resource Strain: Not on file  Food Insecurity: Not on file  Transportation Needs: Not on file  Physical Activity: Not on file  Stress: Not on file  Social Connections: Not on file  Intimate Partner Violence: Not on file    Family History  Problem Relation Age of Onset    Cancer Mother        laryngeal   Pneumonia Father    Diabetes Father    Hypertension Sister    Crohn's disease Brother    Crohn's disease Maternal Grandmother    Cancer Maternal Grandmother        patient unsure of type   Colon cancer Maternal Grandfather     Past Surgical History:  Procedure Laterality Date   ESOPHAGOGASTRODUODENOSCOPY  03/27/2012   Procedure: ESOPHAGOGASTRODUODENOSCOPY (EGD);  Surgeon: Shirley Friar, MD;  Location: [facility];  Service: Endoscopy;  Laterality: N/A;   IRRIGATION AND DEBRIDEMENT ABSCESS N/A 03/13/2015   Procedure: IRRIGATION AND DEBRIDEMENT ABSCESS;  Surgeon: Luretha Murphy, MD;  Location: [facility];  Service: General;  Laterality: N/A;   WISDOM TOOTH EXTRACTION      ROS: Review of Systems Negative except as stated above  PHYSICAL EXAM: BP 116/75 (BP Location: Left Arm, Patient Position: Sitting, Cuff Size: Normal)   Pulse 82   Temp 98.3 F (36.8 C) (Oral)   Ht 5\' 8"  (1.727 m)   Wt 239 lb (108.4 kg)   SpO2 98%   BMI 36.34 kg/m   Wt Readings from Last 3 Encounters:  10/07/22 239 lb (108.4 kg)  09/07/22 242 lb 12.8 oz (110.1 kg)  08/19/22 242 lb (109.8 kg)    Physical Exam   General appearance - alert, well appearing, and in no distress Mental status - normal mood, behavior, speech, dress, motor activity, and thought processes Neck - supple, no significant adenopathy Chest - clear to auscultation, no wheezes, rales or rhonchi, symmetric air entry Heart - normal rate, regular rhythm, normal S1, S2, no murmurs, rubs, clicks or gallops Extremities - peripheral pulses normal, no pedal edema, no clubbing or cyanosis Skin -left foot: Patient has about a 0.8 cm hyperpigmented soft raised lesion on the medial aspect of the left foot     Latest Ref Rng & Units 02/12/2022    9:06 AM 10/13/2021    3:38 PM 06/11/2021    4:00 PM  CMP  Glucose 65 - 99 mg/dL 93  89  73   BUN 7 - 25 mg/dL 6  8  9    Creatinine 0.60 - 1.29 mg/dL 8.75  6.43   3.29   Sodium 135 - 146 mmol/L 137  135  136   Potassium 3.5 - 5.3 mmol/L 4.2  4.2  4.0   Chloride 98 - 110 mmol/L 103  97  97   CO2 20 - 32 mmol/L 27  20  23    Calcium 8.6 - 10.3 mg/dL 51.8  9.7  9.7   Total Protein 6.1 - 8.1 g/dL 7.6   7.7   Total Bilirubin 0.2 - 1.2 mg/dL 0.3   0.2   Alkaline Phos 44 - 121 IU/L   107   AST 10 - 40 U/L 23   27   ALT 9 - 46 U/L 17   24    Lipid Panel     Component Value Date/Time   CHOL 194 04/06/2022 1133   TRIG 169 (H) 04/06/2022 1133   HDL 68 04/06/2022 1133   CHOLHDL 2.9 04/06/2022 1133   CHOLHDL 4.7 08/09/2019 0922   VLDL 28 05/12/2016 0955   LDLCALC 97 04/06/2022 1133   LDLCALC  08/09/2019 0922     Comment:     . LDL cholesterol not calculated. Triglyceride levels greater than 400 mg/dL invalidate calculated LDL results. . Reference range: <100 . Desirable range <100 mg/dL for primary prevention;   <70 mg/dL for patients with CHD or diabetic patients  with > or = 2 CHD risk factors. Marland Kitchen LDL-C is now calculated using the Martin-Hopkins  calculation, which is a validated novel method providing  better accuracy than the Friedewald equation in the  estimation of LDL-C.  Horald Pollen et al. Lenox Ahr. 8416;606(30): 2061-2068  (http://education.[employer].com/faq/FAQ164)     CBC    Component Value Date/Time   WBC 9.7 10/13/2021 1538   WBC 12.9 (H) 08/09/2019 0922   RBC 4.76 10/13/2021 1538   RBC 4.84 08/09/2019 0922   HGB 15.5 10/13/2021 1538   HCT 43.9 10/13/2021 1538   PLT 236 10/13/2021 1538   MCV 92 10/13/2021 1538   MCH 32.6 10/13/2021  1538   MCH 31.6 08/09/2019 0922   MCHC 35.3 10/13/2021 1538   MCHC 34.9 08/09/2019 0922   RDW 13.8 10/13/2021 1538   LYMPHSABS 2.3 10/13/2021 1538   MONOABS 0.7 06/18/2019 0935   EOSABS 0.0 10/13/2021 1538   BASOSABS 0.0 10/13/2021 1538    ASSESSMENT AND PLAN:   1. Class 2 severe obesity with serious comorbidity and body mass index (BMI) of 36.0 to 36.9 in adult, unspecified obesity  type Encourage healthy eating habits. - Hemoglobin A1c - Lipid panel  2. Tobacco dependence Strongly advised to quit.  3. History of herpes genitalis - valACYclovir (VALTREX) 500 MG tablet; TAKE 1 TABLET BY MOUTH TWICE DAILY  Dispense: 14 tablet; Refill: 5  4. Bipolar 1 disorder Plugged in with behavioral health and stable on current medications listed above  5. Foot lesion - Ambulatory referral to Podiatry  6. Asymptomatic HIV infection, with no history of HIV-related illness Followed by ID.  Compliant with his medications.  Last viral load was undetectable on labs done 08/2022.     Patient was given the opportunity to ask questions.  Patient verbalized understanding of the plan and was able to repeat key elements of the plan.   This documentation was completed using Paediatric nurse.  Any transcriptional errors are unintentional.  No orders of the defined types were placed in this encounter.    Requested Prescriptions   Pending Prescriptions Disp Refills   valACYclovir (VALTREX) 500 MG tablet 14 tablet 5    Sig: TAKE 1 TABLET BY MOUTH TWICE DAILY    No follow-ups on file.  Jonah Blue, MD, FACP

## 2022-10-08 ENCOUNTER — Other Ambulatory Visit: Payer: Self-pay | Admitting: Internal Medicine

## 2022-10-08 DIAGNOSIS — E782 Mixed hyperlipidemia: Secondary | ICD-10-CM

## 2022-10-08 LAB — HEMOGLOBIN A1C
Est. average glucose Bld gHb Est-mCnc: 123 mg/dL
Hgb A1c MFr Bld: 5.9 % — ABNORMAL HIGH (ref 4.8–5.6)

## 2022-10-08 LAB — LIPID PANEL
Chol/HDL Ratio: 4.7 ratio (ref 0.0–5.0)
Cholesterol, Total: 261 mg/dL — ABNORMAL HIGH (ref 100–199)
HDL: 56 mg/dL (ref >39)
LDL Chol Calc (NIH): 173 mg/dL — ABNORMAL HIGH (ref 0–99)
Triglycerides: 173 mg/dL — ABNORMAL HIGH (ref 0–149)
VLDL Cholesterol Cal: 32 mg/dL (ref 5–40)

## 2022-10-08 MED ORDER — ROSUVASTATIN CALCIUM 5 MG PO TABS
5.0000 mg | ORAL_TABLET | Freq: Every day | ORAL | 3 refills | Status: DC
Start: 2022-10-08 — End: 2023-03-16

## 2022-10-13 NOTE — Progress Notes (Signed)
BH MD Outpatient Progress Note  10/18/2022 4:43 PM Shawn Brooks  MRN:  [id number]  Assessment:  Shawn Brooks presents for follow-up evaluation in-person. Today, 07/15/22, patient reports some mild disruption in sleep initiation but is able to fall asleep in 25-30 minutes. Complains of hypervigilance in social settings which could be secondary to decreasing alcohol intake, social anxiety disorder, or possibly PTSD.  Will explore this more in later visits.  He was agreeable to starting propranolol 10 mg as a trial to assess if this aids with anxiety.  Alternative options include increasing Prozac or buspirone or switching from propranolol to gabapentin.  Patient will follow-up in 1 to 2 months.  Identifying Information: [name] is a 47 y.o. male with historical diagnosis of bipolar 1 disorder history, HIV on HAART threapy, HLD, and Vitamin D deficiency who is an established patient with [facility] for medication management.     Plan:  # Historical diagnosis of bipolar 1 disorder Past medication trials: Zoloft Status of problem: stable Interventions: -- Continue risperidone 4 mg at bedtime -- Discontinued zoloft after taper last visit -- Continue Prozac 20 mg daily -- Continue Buspar 7.5 mg nightly -- Recommended psychotherapy but again refused  #Social Anxiety Unclear etiology. PTSD vs cutting back on alcohol vs GAD -- START propranolol 10 mg daily   # Insomnia Past medication trials:  Status of problem: stable Interventions: -- Continue trazodone 300 mg nightly   # EtOH use Past medication trials: none Status of problem: improving Interventions: -- Decreased to 2-3 alcoholic drinks 2-3 times per week (down from daily use) -- Psychoeducation provided regarding risks of dependency, tolerance, and withdrawal as well as safe limits of use; patient expresses intent to continue to reduce use   # Metabolic monitoring Interventions: -- SGA:  10/07/22: lipid profile LDL Cholesterol 173 (followed by PCP); Hgb A1c of 5.6>5.9 (10/07/22).     Patient was given contact information for behavioral health clinic and was instructed to call 911 for emergencies.   Subjective:  Chief Complaint:  Chief Complaint  Patient presents with   Depression   Anxiety    Interval History:  Patient reports mood is "fine".  He reports having 2-3 crying spells for unknown reasons in the past month.  He reports overall his depression symptoms are relatively minimal.  He reports that he tosses and turns in bed for approximately 20 to 30 minutes but is able to fall asleep and does not awaken throughout the night.  He reports his appetite currently is stable stating that he will eat approxione meal per day but eats sufficient calories during that meal.   He does mention this time that he has been having worsening social anxiety that has impaired his social outings other review with friends or family gatherings.  He reports this has been ongoing for years but he does not recall having this problem in his 35s.  He reports having symptoms of diaphoresis, chest palpitations, and paranoia that others are "laughing at me".  He reports he avoids social settings at this time because of this.    He tells me that he has been cutting back on his alcohol from 3 drinks per day to 2-3 drinks 3 times a week.  He was agreeable to starting propranolol to help with the physiological symptoms of his anxiety.  I discussed risk/benefits/side effects of propranolol and he agreed to medication trial.  I did discuss with him that he should not operate heavy machinery  or operate motor vehicles when he first takes the medication as it can promote hypotension and fatigue.    Visit Diagnosis:    ICD-10-CM   1. Social anxiety disorder  F40.10 propranolol (INDERAL) 10 MG tablet    2. Bipolar 1 disorder  F31.9 risperidone (RISPERDAL) 4 MG tablet    FLUoxetine (PROZAC) 20 MG capsule     busPIRone (BUSPAR) 7.5 MG tablet    trazodone (DESYREL) 300 MG tablet      Past Psychiatric History:  Diagnoses: Historical diagnosis of bipolar 1 disorder Medication trials: Zoloft Substance use:             -- Etoh: 6 beers daily; denies history of withdrawal             -- Cannabis: 3-4 times weekly             -- Denies use of illicit drugs  Past Medical History:  Past Medical History:  Diagnosis Date   Anal condyloma    Anxiety    Blood dyscrasia    HIV   Chronic back pain    Depression    DJD (degenerative joint disease)    Gastric ulcer    GERD (gastroesophageal reflux disease)    Headache(784.0)    Hiatal hernia    HIV infection    Hyperplastic colon polyp    Hypertension    Internal hemorrhoids     Past Surgical History:  Procedure Laterality Date   ESOPHAGOGASTRODUODENOSCOPY  03/27/2012   Procedure: ESOPHAGOGASTRODUODENOSCOPY (EGD);  Surgeon: Shirley Friar, MD;  Location: [facility];  Service: Endoscopy;  Laterality: N/A;   IRRIGATION AND DEBRIDEMENT ABSCESS N/A 03/13/2015   Procedure: IRRIGATION AND DEBRIDEMENT ABSCESS;  Surgeon: Luretha Murphy, MD;  Location: [facility];  Service: General;  Laterality: N/A;   WISDOM TOOTH EXTRACTION      Family History:  Family History  Problem Relation Age of Onset   Cancer Mother        laryngeal   Pneumonia Father    Diabetes Father    Hypertension Sister    Crohn's disease Brother    Crohn's disease Maternal Grandmother    Cancer Maternal Grandmother        patient unsure of type   Colon cancer Maternal Grandfather     Social History:  Social History   Socioeconomic History   Marital status: Divorced    Spouse name: Not on file   Number of children: 1   Years of education: Not on file   Highest education level: Not on file  Occupational History   Occupation: Disabled  Tobacco Use   Smoking status: Every Day    Packs/day: 1    Types: Cigarettes   Smokeless tobacco: Never   Tobacco comments:     not ready to quit.  Vaping Use   Vaping Use: Never used  Substance and Sexual Activity   Alcohol use: Yes    Comment: daily   Drug use: Yes    Frequency: 4.0 times per week    Types: Marijuana   Sexual activity: Not Currently    Partners: Male    Comment: declined condoms  Other Topics Concern   Not on file  Social History Narrative   Not on file   Social Determinants of Health   Financial Resource Strain: Not on file  Food Insecurity: Not on file  Transportation Needs: Not on file  Physical Activity: Not on file  Stress: Not on file  Social Connections: Not on  file    Allergies:  Allergies  Allergen Reactions   Chantix [Varenicline] Other (See Comments)    Bad dreams   Oxycodone Itching    Current Medications: Current Outpatient Medications  Medication Sig Dispense Refill   busPIRone (BUSPAR) 7.5 MG tablet Take 1 tablet (7.5 mg total) by mouth at bedtime. 30 tablet 2   propranolol (INDERAL) 10 MG tablet Take 1 tablet (10 mg total) by mouth daily. 30 tablet 1   albuterol (VENTOLIN HFA) 108 (90 Base) MCG/ACT inhaler Inhale 2 puffs into the lungs every 6 (six) hours as needed for wheezing or shortness of breath. 18 g 1   darunavir (PREZISTA) 800 MG tablet TAKE 1 TABLET(800 MG) BY MOUTH DAILY 30 tablet 2   diphenoxylate-atropine (LOMOTIL) 2.5-0.025 MG tablet Take 1 tablet by mouth 3 (three) times daily as needed for diarrhea or loose stools. 60 tablet 2   Eluxadoline (VIBERZI) 100 MG TABS Take 1 tablet one to two times daily 60 tablet 3   elvitegravir-cobicistat-emtricitabine-tenofovir (GENVOYA) 150-150-200-10 MG TABS tablet TAKE 1 TABLET BY MOUTH DAILY 30 tablet 2   FLUoxetine (PROZAC) 20 MG capsule Take 1 capsule (20 mg total) by mouth daily. 30 capsule 2   risperidone (RISPERDAL) 4 MG tablet Take 1 tablet (4 mg total) by mouth at bedtime. 30 tablet 2   rosuvastatin (CRESTOR) 5 MG tablet Take 1 tablet (5 mg total) by mouth daily. 30 tablet 3   trazodone (DESYREL) 300 MG  tablet Take 1 tablet (300 mg total) by mouth at bedtime. 30 tablet 2   valACYclovir (VALTREX) 500 MG tablet TAKE 1 TABLET BY MOUTH TWICE DAILY 14 tablet 5   No current facility-administered medications for this visit.    ROS: Review of Systems  Objective:  Psychiatric Specialty Exam: Blood pressure (!) 121/50, pulse 94, weight 233 lb 6.4 oz (105.9 kg).Body mass index is 35.49 kg/m.  General Appearance: Casual  Eye Contact:  Good  Speech:  Clear and Coherent and Normal Rate  Volume:  Normal  Mood:  Euthymic  Affect:  Appropriate and Congruent  Thought Process:  Coherent, Goal Directed, and Linear  Orientation:  Full (Time, Place, and Person)  Thought Content: Logical   Suicidal Thoughts:  No  Homicidal Thoughts:  No  Memory:  Remote;   Good  Judgment:  Fair  Insight:  Fair  Psychomotor Activity:  Normal  Concentration:  Concentration: Good and Attention Span: Good              Assets:  Communication Skills Desire for Improvement Financial Resources/Insurance Housing Intimacy Leisure Time Physical Health Resilience Social Support Talents/Skills Transportation Vocational/Educational  ADL's:  Intact  Cognition: WNL  Sleep:  Good   PE: General: well-appearing; no acute distress  Pulm: no increased work of breathing on room air  Strength & Muscle Tone: within normal limits Neuro: no focal neurological deficits observed  Gait & Station: normal  Metabolic Disorder Labs: Lab Results  Component Value Date   HGBA1C 5.9 (H) 10/07/2022   MPG 117 (H) 11/16/2013   No results found for: "PROLACTIN" Lab Results  Component Value Date   CHOL 261 (H) 10/07/2022   TRIG 173 (H) 10/07/2022   HDL 56 10/07/2022   CHOLHDL 4.7 10/07/2022   VLDL 28 05/12/2016   LDLCALC 173 (H) 10/07/2022   LDLCALC 97 04/06/2022   Lab Results  Component Value Date   TSH 1.150 09/01/2017   TSH 1.080 01/27/2017    Therapeutic Level Labs: No results found for: "LITHIUM"  No results  found for: "VALPROATE" No results found for: "CBMZ"  Screenings: GAD-7    Flowsheet Row Office Visit from 10/07/2022 in [facility] Video Visit from [facility] Video Visit from 11/04/2021 in [facility] Office Visit from 10/13/2021 in [facility] Office Visit from 06/11/2021 in [facility]  Total GAD-7 Score 0 16 11 6  0      PHQ2-9    Flowsheet Row Office Visit from 10/07/2022 in [facility] Office Visit from 09/07/2022 in [facility] Video Visit from 03/18/2022 in [facility] Office Visit from 02/12/2022 in [facility] Video Visit from 11/04/2021 in [facility]  PHQ-2 Total Score 0 0 5 1 3   PHQ-9 Total Score 0 -- 20 -- 13      Flowsheet Row Video Visit from 03/18/2022 in [facility] Video Visit from 11/04/2021 in [facility] Video Visit from 05/01/2021 in [facility]  C-SSRS RISK CATEGORY No Risk No Risk No Risk       Collaboration of Care: Collaboration of Care:   Patient/Guardian was advised Release of Information must be obtained prior to any record release in order to collaborate their care with an outside provider. Patient/Guardian was advised if they have not already done so to contact the registration department to sign all necessary forms in order for [place] to release information regarding their care.   Consent: Patient/Guardian gives verbal consent for treatment and assignment of benefits for services provided during this visit. Patient/Guardian expressed understanding and agreed to proceed.   A total of 30 minutes was spent involved in face to face clinical care,  chart review, and documentation.   Park Pope, MD 10/18/2022, 4:43 PM

## 2022-10-14 ENCOUNTER — Other Ambulatory Visit: Payer: Self-pay | Admitting: Internal Medicine

## 2022-10-14 ENCOUNTER — Ambulatory Visit (INDEPENDENT_AMBULATORY_CARE_PROVIDER_SITE_OTHER): Payer: Medicaid Other | Admitting: Student

## 2022-10-14 VITALS — BP 121/50 | HR 94 | Wt 233.4 lb

## 2022-10-14 DIAGNOSIS — F319 Bipolar disorder, unspecified: Secondary | ICD-10-CM | POA: Diagnosis not present

## 2022-10-14 DIAGNOSIS — F4010 Social phobia, unspecified: Secondary | ICD-10-CM | POA: Diagnosis not present

## 2022-10-14 DIAGNOSIS — B20 Human immunodeficiency virus [HIV] disease: Secondary | ICD-10-CM

## 2022-10-14 MED ORDER — RISPERIDONE 4 MG PO TABS
4.0000 mg | ORAL_TABLET | Freq: Every evening | ORAL | 2 refills | Status: DC
Start: 2022-10-14 — End: 2022-12-15

## 2022-10-14 MED ORDER — PROPRANOLOL HCL 10 MG PO TABS
10.0000 mg | ORAL_TABLET | Freq: Every day | ORAL | 1 refills | Status: DC
Start: 2022-10-14 — End: 2023-02-24

## 2022-10-14 MED ORDER — TRAZODONE HCL 300 MG PO TABS
300.0000 mg | ORAL_TABLET | Freq: Every evening | ORAL | 2 refills | Status: DC
Start: 2022-10-14 — End: 2023-02-24

## 2022-10-14 MED ORDER — FLUOXETINE HCL 20 MG PO CAPS
20.0000 mg | ORAL_CAPSULE | Freq: Every day | ORAL | 2 refills | Status: DC
Start: 2022-10-14 — End: 2023-02-24

## 2022-10-14 MED ORDER — BUSPIRONE HCL 7.5 MG PO TABS
7.5000 mg | ORAL_TABLET | Freq: Every day | ORAL | 2 refills | Status: AC
Start: 2022-10-14 — End: 2023-01-12

## 2022-10-22 ENCOUNTER — Ambulatory Visit (INDEPENDENT_AMBULATORY_CARE_PROVIDER_SITE_OTHER): Payer: Medicaid Other | Admitting: Podiatry

## 2022-10-22 DIAGNOSIS — D492 Neoplasm of unspecified behavior of bone, soft tissue, and skin: Secondary | ICD-10-CM

## 2022-10-22 DIAGNOSIS — B070 Plantar wart: Secondary | ICD-10-CM

## 2022-10-22 NOTE — Progress Notes (Signed)
   Chief Complaint  Patient presents with   Callouses    Patient came in today for a lesion on the left foot medial side of the foot Arch area, started 4 months ago ago, rate of pain 4 out of 10, spot is dark in color,     Subjective: 47 y.o. male presenting today for evaluation of a symptomatic skin lesion that developed to the medial aspect of the left foot over the past few months.  Idiopathic onset.  Patient states that it is very tender.  Presenting for further treatment and evaluation   Past Medical History:  Diagnosis Date   Anal condyloma    Anxiety    Blood dyscrasia    HIV   Chronic back pain    Depression    DJD (degenerative joint disease)    Gastric ulcer    GERD (gastroesophageal reflux disease)    Headache(784.0)    Hiatal hernia    HIV infection (HCC)    Hyperplastic colon polyp    Hypertension    Internal hemorrhoids     Objective: Physical Exam General: The patient is alert and oriented x3 in no acute distress.   Dermatology: Hyperkeratotic skin lesion(s) noted to the plantar aspect of the left foot approximately 1 cm in diameter. Pinpoint bleeding noted upon debridement. Skin is warm, dry and supple bilateral lower extremities. Negative for open lesions or macerations.   Vascular: Palpable pedal pulses bilaterally. No edema or erythema noted. Capillary refill within normal limits.   Neurological: Epicritic and protective threshold grossly intact bilaterally.    Musculoskeletal Exam: Pain on palpation to the noted skin lesion(s).  Range of motion within normal limits to all pedal and ankle joints bilateral. Muscle strength 5/5 in all groups bilateral.    Assessment: #1 plantar wart left foot   Plan of Care:  #1 Patient was evaluated. #2 Excisional debridement of the plantar wart lesion(s) was performed using a chisel blade.  Salicylic acid was applied and the lesion(s) was dressed with a dry sterile dressing. #3  Small amount of Salinocaine was  provided for the patient to apply daily as tolerated for an additional 2 weeks #4 return to clinic as needed if he needs additional debridement or evaluation  Felecia Shelling, DPM [facility]  Dr. Felecia Shelling, DPM    [address]                                        [place], [place] [address]                Office [phone]  Fax [phone]

## 2022-11-18 ENCOUNTER — Encounter (HOSPITAL_COMMUNITY): Payer: Self-pay | Admitting: Student

## 2022-12-14 ENCOUNTER — Telehealth (HOSPITAL_COMMUNITY): Payer: Self-pay

## 2022-12-14 DIAGNOSIS — F319 Bipolar disorder, unspecified: Secondary | ICD-10-CM

## 2022-12-15 MED ORDER — RISPERIDONE 4 MG PO TABS
4.0000 mg | ORAL_TABLET | Freq: Every evening | ORAL | 0 refills | Status: DC
Start: 2022-12-15 — End: 2023-02-24

## 2022-12-15 NOTE — Telephone Encounter (Signed)
Called patient. He reports the pharmacy did not receive his prescription of risperidone. Sent in for 30 day refill. Emphasized importance of making it to his next appointment on 12/23/22 as he missed his last appointment on 11/18/22. Will need to go through open access again if he is to miss this appointment.  Park Pope, MD Psychiatry Resident, PGY2

## 2022-12-23 ENCOUNTER — Encounter (HOSPITAL_COMMUNITY): Payer: Medicaid Other | Admitting: Student

## 2023-01-05 NOTE — Progress Notes (Deleted)
BH MD Outpatient Progress Note  01/05/2023 2:39 PM Shawn Brooks  MRN:  [id number]  Assessment:  Shawn Brooks presents for follow-up evaluation in-person. Today, 07/15/22, patient reports some mild disruption in sleep initiation but is able to fall asleep in 25-30 minutes. Complains of hypervigilance in social settings which could be secondary to decreasing alcohol intake, social anxiety disorder, or possibly PTSD.  Will explore this more in later visits.  He was agreeable to starting propranolol 10 mg as a trial to assess if this aids with anxiety.  Alternative options include increasing Prozac or buspirone or switching from propranolol to gabapentin.  Patient will follow-up in 1 to 2 months.  Identifying Information: Shawn Brooks is a 47 y.o. male with historical diagnosis of bipolar 1 disorder history, HIV on HAART threapy, HLD, and Vitamin D deficiency who is an established patient with [facility] for medication management.     Plan:  # Historical diagnosis of bipolar 1 disorder Past medication trials: Zoloft Status of problem: stable Interventions: -- Continue risperidone 4 mg at bedtime -- Discontinued zoloft after taper last visit -- Continue Prozac 20 mg daily -- Continue Buspar 7.5 mg nightly -- Recommended psychotherapy but again refused  #Social Anxiety Unclear etiology. PTSD vs cutting back on alcohol vs GAD -- START propranolol 10 mg daily   # Insomnia Past medication trials:  Status of problem: stable Interventions: -- Continue trazodone 300 mg nightly   # EtOH use Past medication trials: none Status of problem: improving Interventions: -- Decreased to 2-3 alcoholic drinks 2-3 times per week (down from daily use) -- Psychoeducation provided regarding risks of dependency, tolerance, and withdrawal as well as safe limits of use; patient expresses intent to continue to reduce use   # Metabolic monitoring Interventions: -- SGA:  10/07/22: lipid profile LDL Cholesterol 173 (followed by PCP); Hgb A1c of 5.6>5.9 (10/07/22).     Patient was given contact information for behavioral health clinic and was instructed to call 911 for emergencies.   Subjective:  Chief Complaint:  No chief complaint on file.   Interval History:  Patient reports mood is "fine".  He reports having 2-3 crying spells for unknown reasons in the past month.  He reports overall his depression symptoms are relatively minimal.  He reports that he tosses and turns in bed for approximately 20 to 30 minutes but is able to fall asleep and does not awaken throughout the night.  He reports his appetite currently is stable stating that he will eat approxione meal per day but eats sufficient calories during that meal.   He does mention this time that he has been having worsening social anxiety that has impaired his social outings other review with friends or family gatherings.  He reports this has been ongoing for years but he does not recall having this problem in his 6s.  He reports having symptoms of diaphoresis, chest palpitations, and paranoia that others are "laughing at me".  He reports he avoids social settings at this time because of this.    He tells me that he has been cutting back on his alcohol from 3 drinks per day to 2-3 drinks 3 times a week.  He was agreeable to starting propranolol to help with the physiological symptoms of his anxiety.  I discussed risk/benefits/side effects of propranolol and he agreed to medication trial.  I did discuss with him that he should not operate heavy machinery or operate motor vehicles when he first takes  the medication as it can promote hypotension and fatigue.    Visit Diagnosis:  No diagnosis found.   Past Psychiatric History:  Diagnoses: Historical diagnosis of bipolar 1 disorder Medication trials: Zoloft Substance use:             -- Etoh: 6 beers daily; denies history of withdrawal             --  Cannabis: 3-4 times weekly             -- Denies use of illicit drugs  Past Medical History:  Past Medical History:  Diagnosis Date   Anal condyloma    Anxiety    Blood dyscrasia    HIV   Chronic back pain    Depression    DJD (degenerative joint disease)    Gastric ulcer    GERD (gastroesophageal reflux disease)    Headache(784.0)    Hiatal hernia    HIV infection (HCC)    Hyperplastic colon polyp    Hypertension    Internal hemorrhoids     Past Surgical History:  Procedure Laterality Date   ESOPHAGOGASTRODUODENOSCOPY  03/27/2012   Procedure: ESOPHAGOGASTRODUODENOSCOPY (EGD);  Surgeon: Shirley Friar, MD;  Location: [facility];  Service: Endoscopy;  Laterality: N/A;   IRRIGATION AND DEBRIDEMENT ABSCESS N/A 03/13/2015   Procedure: IRRIGATION AND DEBRIDEMENT ABSCESS;  Surgeon: Luretha Murphy, MD;  Location: [facility];  Service: General;  Laterality: N/A;   WISDOM TOOTH EXTRACTION      Family History:  Family History  Problem Relation Age of Onset   Cancer Mother        laryngeal   Pneumonia Father    Diabetes Father    Hypertension Sister    Crohn's disease Brother    Crohn's disease Maternal Grandmother    Cancer Maternal Grandmother        patient unsure of type   Colon cancer Maternal Grandfather     Social History:  Social History   Socioeconomic History   Marital status: Divorced    Spouse name: Not on file   Number of children: 1   Years of education: Not on file   Highest education level: Not on file  Occupational History   Occupation: Disabled  Tobacco Use   Smoking status: Every Day    Packs/day: 1    Types: Cigarettes   Smokeless tobacco: Never   Tobacco comments:    not ready to quit.  Vaping Use   Vaping Use: Never used  Substance and Sexual Activity   Alcohol use: Yes    Comment: daily   Drug use: Yes    Frequency: 4.0 times per week    Types: Marijuana   Sexual activity: Not Currently    Partners: Male    Comment: declined  condoms  Other Topics Concern   Not on file  Social History Narrative   Not on file   Social Determinants of Health   Financial Resource Strain: Not on file  Food Insecurity: Not on file  Transportation Needs: Not on file  Physical Activity: Not on file  Stress: Not on file  Social Connections: Not on file    Allergies:  Allergies  Allergen Reactions   Chantix [Varenicline] Other (See Comments)    Bad dreams   Oxycodone Itching    Current Medications: Current Outpatient Medications  Medication Sig Dispense Refill   albuterol (VENTOLIN HFA) 108 (90 Base) MCG/ACT inhaler Inhale 2 puffs into the lungs every 6 (six) hours as needed  for wheezing or shortness of breath. 18 g 1   busPIRone (BUSPAR) 7.5 MG tablet Take 1 tablet (7.5 mg total) by mouth at bedtime. 30 tablet 2   darunavir (PREZISTA) 800 MG tablet TAKE 1 TABLET(800 MG) BY MOUTH DAILY 30 tablet 2   diphenoxylate-atropine (LOMOTIL) 2.5-0.025 MG tablet Take 1 tablet by mouth 3 (three) times daily as needed for diarrhea or loose stools. 60 tablet 2   Eluxadoline (VIBERZI) 100 MG TABS Take 1 tablet one to two times daily 60 tablet 3   elvitegravir-cobicistat-emtricitabine-tenofovir (GENVOYA) 150-150-200-10 MG TABS tablet TAKE 1 TABLET BY MOUTH DAILY 30 tablet 2   FLUoxetine (PROZAC) 20 MG capsule Take 1 capsule (20 mg total) by mouth daily. 30 capsule 2   propranolol (INDERAL) 10 MG tablet Take 1 tablet (10 mg total) by mouth daily. 30 tablet 1   risperidone (RISPERDAL) 4 MG tablet Take 1 tablet (4 mg total) by mouth at bedtime. 30 tablet 0   rosuvastatin (CRESTOR) 5 MG tablet Take 1 tablet (5 mg total) by mouth daily. 30 tablet 3   trazodone (DESYREL) 300 MG tablet Take 1 tablet (300 mg total) by mouth at bedtime. 30 tablet 2   valACYclovir (VALTREX) 500 MG tablet TAKE 1 TABLET BY MOUTH TWICE DAILY 14 tablet 5   No current facility-administered medications for this visit.    ROS: Review of  Systems  Objective:  Psychiatric Specialty Exam: There were no vitals taken for this visit.There is no height or weight on file to calculate BMI.  General Appearance: Casual  Eye Contact:  Good  Speech:  Clear and Coherent and Normal Rate  Volume:  Normal  Mood:  Euthymic  Affect:  Appropriate and Congruent  Thought Process:  Coherent, Goal Directed, and Linear  Orientation:  Full (Time, Place, and Person)  Thought Content: Logical   Suicidal Thoughts:  No  Homicidal Thoughts:  No  Memory:  Remote;   Good  Judgment:  Fair  Insight:  Fair  Psychomotor Activity:  Normal  Concentration:  Concentration: Good and Attention Span: Good              Assets:  Communication Skills Desire for Improvement Financial Resources/Insurance Housing Intimacy Leisure Time Physical Health Resilience Social Support Talents/Skills Transportation Vocational/Educational  ADL's:  Intact  Cognition: WNL  Sleep:  Good   PE: General: well-appearing; no acute distress  Pulm: no increased work of breathing on room air  Strength & Muscle Tone: within normal limits Neuro: no focal neurological deficits observed  Gait & Station: normal  Metabolic Disorder Labs: Lab Results  Component Value Date   HGBA1C 5.9 (H) 10/07/2022   MPG 117 (H) 11/16/2013   No results found for: "PROLACTIN" Lab Results  Component Value Date   CHOL 261 (H) 10/07/2022   TRIG 173 (H) 10/07/2022   HDL 56 10/07/2022   CHOLHDL 4.7 10/07/2022   VLDL 28 05/12/2016   LDLCALC 173 (H) 10/07/2022   LDLCALC 97 04/06/2022   Lab Results  Component Value Date   TSH 1.150 09/01/2017   TSH 1.080 01/27/2017    Therapeutic Level Labs: No results found for: "LITHIUM" No results found for: "VALPROATE" No results found for: "CBMZ"  Screenings: GAD-7    Flowsheet Row Office Visit from 10/07/2022 in [facility] Video Visit from 03/18/2022 in [facility]  Video Visit from 11/04/2021 in [facility] Office Visit from 10/13/2021 in [facility] Office Visit from 06/11/2021 in [facility]  Total GAD-7 Score 0 16 11 6  0      PHQ2-9    Flowsheet Row Office Visit from 10/07/2022 in [facility] Office Visit from 09/07/2022 in [facility] Video Visit from 03/18/2022 in [facility] Office Visit from 02/12/2022 in [facility] Video Visit from 11/04/2021 in [facility]  PHQ-2 Total Score 0 0 5 1 3   PHQ-9 Total Score 0 -- 20 -- 13      Flowsheet Row Video Visit from 03/18/2022 in [facility] Video Visit from 11/04/2021 in [facility] Video Visit from 05/01/2021 in [facility]  C-SSRS RISK CATEGORY No Risk No Risk No Risk       Collaboration of Care: Collaboration of Care:   Patient/Guardian was advised Release of Information must be obtained prior to any record release in order to collaborate their care with an outside provider. Patient/Guardian was advised if they have not already done so to contact the registration department to sign all necessary forms in order for [place] to release information regarding their care.   Consent: Patient/Guardian gives verbal consent for treatment and assignment of benefits for services provided during this visit. Patient/Guardian expressed understanding and agreed to proceed.   A total of 30 minutes was spent involved in face to face clinical care, chart review, and documentation.   Park Pope, MD 01/05/2023, 2:39 PM

## 2023-01-06 ENCOUNTER — Encounter (HOSPITAL_COMMUNITY): Payer: Self-pay | Admitting: Student

## 2023-01-13 ENCOUNTER — Other Ambulatory Visit: Payer: Self-pay | Admitting: Internal Medicine

## 2023-01-13 DIAGNOSIS — B20 Human immunodeficiency virus [HIV] disease: Secondary | ICD-10-CM

## 2023-02-18 ENCOUNTER — Other Ambulatory Visit: Payer: Self-pay | Admitting: Internal Medicine

## 2023-02-21 ENCOUNTER — Telehealth: Payer: Self-pay | Admitting: Internal Medicine

## 2023-02-21 NOTE — Telephone Encounter (Signed)
PT is calling to have a written copy of his Viberzi prescription. Please advise.

## 2023-02-22 MED ORDER — VIBERZI 100 MG PO TABS: ORAL_TABLET | ORAL | 4 refills | Status: DC

## 2023-02-22 NOTE — Telephone Encounter (Signed)
Informed patient I will print the prescription and leave it at the front desk for patient to pick up. Patient verbalized understanding.

## 2023-02-24 ENCOUNTER — Ambulatory Visit (HOSPITAL_COMMUNITY): Payer: Medicaid Other | Admitting: Student

## 2023-02-24 VITALS — BP 123/88 | HR 80 | Wt 220.0 lb

## 2023-02-24 DIAGNOSIS — F333 Major depressive disorder, recurrent, severe with psychotic symptoms: Secondary | ICD-10-CM | POA: Insufficient documentation

## 2023-02-24 DIAGNOSIS — F4010 Social phobia, unspecified: Secondary | ICD-10-CM | POA: Diagnosis not present

## 2023-02-24 DIAGNOSIS — G4733 Obstructive sleep apnea (adult) (pediatric): Secondary | ICD-10-CM | POA: Diagnosis not present

## 2023-02-24 DIAGNOSIS — R413 Other amnesia: Secondary | ICD-10-CM | POA: Diagnosis not present

## 2023-02-24 MED ORDER — TRAZODONE HCL 300 MG PO TABS: 300.0000 mg | ORAL_TABLET | Freq: Every evening | ORAL | 0 refills | Status: DC

## 2023-02-24 MED ORDER — FLUOXETINE HCL 40 MG PO CAPS
40.0000 mg | ORAL_CAPSULE | Freq: Every day | ORAL | 0 refills | Status: AC
Start: 2023-02-24 — End: 2023-03-26

## 2023-02-24 MED ORDER — RISPERIDONE 4 MG PO TABS
2.0000 mg | ORAL_TABLET | Freq: Every evening | ORAL | Status: AC
Start: 2023-02-24 — End: 2023-03-26

## 2023-02-24 NOTE — Progress Notes (Signed)
BH MD Outpatient Progress Note  02/25/2023 11:57 AM Shawn Brooks  MRN:  [id number]  Assessment:  Shawn Brooks presents for follow-up evaluation in-person. Today, patient reports continued feelings of depression and recently poor sleep due to running out of trazodone. He continues to complain of anxiety especially in social settings. He has decreased alcohol intake since last visit to 1 drink per week but continues to use 1/2 ppd of cigarettes and 1-2 blunts of marijuana daily.   Plan to explore PTSD more thoroughly next visit.  Upon reviewing his bipolar diagnosis, it does appear that his symptoms are more consistent with major depressive disorder with psychotic features as opposed to bipolar disorder.  He also complains of memory problems for which the differential is relatively wide including HIV related dementia, pseudodementia secondary to MDD, Wernicke's encephalopathy secondary to alcohol use disorder, iatrogenic, and hypothyroidism.  Given there is no specific indication for his risperidone at this time with the exception of former psychotic features secondary to MDD, plan to decrease risperidone dose to 2 mg and increase fluoxetine to better manage his anxiety and depressive symptoms.  Patient will follow-up in 1 month.  Identifying Information: Shawn Brooks is a 47 y.o. male with historical diagnosis of MDD, social anxiety disorder, HIV on HAART threapy, HLD, and Vitamin D deficiency who is an established patient with [facility] for medication management.     Plan:  # Major depressive disorder-recurrent episode, severe with psychotic features Past medication trials: Zoloft Status of problem: stable Interventions: -- Decrease risperidone to 2 mg at bedtime -- Increase Prozac to 40 mg daily -- Recommended psychotherapy -Labs: CBC, CMP, folate, B12, TSH, vitamin D, ECG  #Social Anxiety Disorder Unclear etiology. PTSD vs GAD vs avoidant personality  disorder -self discontinued propranolol (consider restarting at higher dose if continue to struggle with anxiety)   # Insomnia Past medication trials:  Status of problem: stable Interventions: -- Continue trazodone 300 mg nightly   # EtOH use disorder, in early remission Past medication trials: none Status of problem: improving Interventions: -- Decreased to 1 drink per week (down from daily use) -- Psychoeducation provided regarding risks of dependency, tolerance, and withdrawal as well as safe limits of use; patient expresses intent to continue to reduce use   # Metabolic monitoring Interventions: -- SGA: 10/07/22: lipid profile LDL Cholesterol 173 (followed by PCP); Hgb A1c of 5.6>5.9 (10/07/22).   # Memory problems -Unclear etiology but differential includes pseudodementia 2/2 MDD, wernicke's encepahlopathy versus HIV dementia versus iatrogenic  #Suspected OSA -referral for sleep study    Patient was given contact information for behavioral health clinic and was instructed to call 911 for emergencies.   Subjective:  Chief Complaint:  No chief complaint on file.   Interval History:  Patient presents as follow-up after missing 2 appointments. He reports he has run out of trazodone for 3-4 days and has had significant decrease in sleep which has led to significant fatigue.  He does report that when he sleeps with trazodone he sleeps approximately 8 to 9 hours and feels well rested.  He reports continuing to feel anxious especially in social settings.  He recently attended a funeral and it was very stressful for him because he was worried he would not have a good response to various family members inquiries.  He reports being medication compliant since last visit with the exception of propranolol.  He did not continue this medication because he ran out but also did not  feel it was effective.  Denies SI/HI/AVH.  Reports appetite has been fair.  Upon discussion of patient's bipolar  disorder diagnosis, he cites that this diagnosis was given to him after he had multiple episodes of crying spells and irritability.  He denies explicitly ever having a manic or hypomanic episode.  With regards to his substance use, he continues to drink approximately 1 wine or beer per week socially, use half a pack of cigarettes per day, and smoke 1-2 blunts per day of cannabis.  He reports that his cannabis use has been since age of 2 and has been difficult to quit.  His primary complaint at this time is his anxiety as well as his memory problems.  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  I discussed differential and recommended he speak with his infectious disease doctor as well as PCP.   He was amenable to decreasing the risperidone to 2 mg as he did not has not had significant symptoms of psychosis for several months now.  He was also amenable to increasing the fluoxetine to 40 mg to better aid with his social anxiety.   Visit Diagnosis:    ICD-10-CM   1. Social anxiety disorder  F40.10 trazodone (DESYREL) 300 MG tablet    2. Obstructive sleep apnea  G47.33 Ambulatory referral to Sleep Studies    3. 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 CBC with Differential/Platelet    TSH + free T4    EKG 12-Lead    FLUoxetine (PROZAC) 40 MG capsule    risperidone (RISPERDAL) 4 MG tablet    trazodone (DESYREL) 300 MG tablet    4. Memory problem  R41.3 Comprehensive Metabolic Panel (CMET)    TSH + free T4    B12 and Folate Panel    VITAMIN D 25 Hydroxy (Vit-D Deficiency, Fractures)       Past Psychiatric History:  Diagnoses: Historical diagnosis of bipolar 1 disorder Medication trials: Zoloft Substance use:             -- Etoh: 6 beers daily; denies history of  withdrawal             -- Cannabis: 3-4 times weekly             -- Denies use of illicit drugs  Past Medical History:  Past Medical History:  Diagnosis Date   Anal condyloma    Anxiety    Blood dyscrasia    HIV   Chronic back pain    Depression    DJD (degenerative joint disease)    Gastric ulcer    GERD (gastroesophageal reflux disease)    Headache(784.0)    Hiatal hernia    HIV infection (HCC)    Hyperplastic colon polyp    Hypertension    Internal hemorrhoids     Past Surgical History:  Procedure Laterality Date   ESOPHAGOGASTRODUODENOSCOPY  03/27/2012   Procedure: ESOPHAGOGASTRODUODENOSCOPY (EGD);  Surgeon: Shirley Friar, MD;  Location: [facility];  Service: Endoscopy;  Laterality: N/A;   IRRIGATION AND DEBRIDEMENT ABSCESS N/A 03/13/2015   Procedure: IRRIGATION AND DEBRIDEMENT ABSCESS;  Surgeon: Luretha Murphy, MD;  Location: [facility];  Service: General;  Laterality: N/A;   WISDOM TOOTH EXTRACTION      Family History:  Family History  Problem Relation Age of Onset   Cancer Mother        laryngeal   Pneumonia Father    Diabetes Father    Hypertension Sister    Crohn's disease Brother    Crohn's disease Maternal Grandmother    Cancer Maternal Grandmother        patient unsure of type   Colon cancer Maternal Grandfather     Social History:  Social History   Socioeconomic History   Marital status: Divorced    Spouse name: Not on file   Number of children: 1   Years of education: Not on file   Highest education level: Not on file  Occupational History   Occupation: Disabled  Tobacco Use   Smoking status: Every Day    Current packs/day: 1.00    Types: Cigarettes   Smokeless tobacco: Never   Tobacco comments:    not ready to quit.  Vaping Use   Vaping status: Never Used  Substance and Sexual Activity   Alcohol use: Yes    Comment: daily   Drug use: Yes    Frequency: 4.0 times per week    Types: Marijuana   Sexual activity: Not Currently     Partners: Male    Comment: declined condoms  Other Topics Concern   Not on file  Social History Narrative   Not on file   Social Determinants of Health   Financial Resource Strain: Not on file  Food Insecurity: Not on file  Transportation Needs: Not on file  Physical Activity: Not on file  Stress: Not on file  Social Connections: Not on file    Allergies:  Allergies  Allergen Reactions   Chantix [Varenicline] Other (See Comments)    Bad dreams   Oxycodone Itching    Current Medications: Current Outpatient Medications  Medication Sig Dispense Refill   albuterol (VENTOLIN HFA) 108 (90 Base) MCG/ACT inhaler Inhale 2 puffs into the lungs every 6 (six) hours as needed for wheezing or shortness of breath. 18 g 1   darunavir (PREZISTA) 800 MG tablet TAKE 1 TABLET(800 MG) BY MOUTH DAILY 30 tablet 1   diphenoxylate-atropine (LOMOTIL) 2.5-0.025 MG tablet Take 1 tablet by mouth 3 (three) times daily as needed for diarrhea or loose stools. 60 tablet 2   Eluxadoline (VIBERZI) 100 MG TABS TAKE 1 TABLET BY MOUTH ONE TO TWO TIMES DAILY 60 tablet 4   elvitegravir-cobicistat-emtricitabine-tenofovir (GENVOYA) 150-150-200-10 MG TABS tablet TAKE 1 TABLET BY MOUTH DAILY 30 tablet 1   FLUoxetine (PROZAC) 40 MG capsule Take 1 capsule (40 mg total) by mouth daily. 30 capsule 0   risperidone (RISPERDAL) 4 MG tablet Take 0.5 tablets (2 mg total) by mouth at bedtime.     rosuvastatin (CRESTOR) 5 MG tablet Take 1 tablet (5 mg total) by mouth daily. 30 tablet 3   trazodone (DESYREL) 300 MG tablet Take 1 tablet (300 mg total) by mouth at bedtime. 30 tablet 0   valACYclovir (VALTREX) 500 MG tablet TAKE 1 TABLET BY MOUTH TWICE DAILY 14 tablet 5   No current facility-administered medications for this visit.    ROS: Review of Systems  Objective:  Psychiatric Specialty Exam: There were no vitals taken for this visit.There is no height or weight on file to calculate BMI.  General Appearance: Casual  Eye  Contact:  Good  Speech:  Clear and Coherent and Normal Rate  Volume:  Normal  Mood:  Euthymic  Affect:  Appropriate and Congruent  Thought Process:  Coherent,  Goal Directed, and Linear  Orientation:  Full (Time, Place, and Person)  Thought Content: Logical   Suicidal Thoughts:  No  Homicidal Thoughts:  No  Memory:  Remote;   Good  Judgment:  Fair  Insight:  Fair  Psychomotor Activity:  Normal  Concentration:  Concentration: Good and Attention Span: Good              Assets:  Communication Skills Desire for Improvement Financial Resources/Insurance Housing Intimacy Leisure Time Physical Health Resilience Social Support Talents/Skills Transportation Vocational/Educational  ADL's:  Intact  Cognition: WNL  Sleep:  Good   PE: General: well-appearing; no acute distress  Pulm: no increased work of breathing on room air  Strength & Muscle Tone: within normal limits Neuro: no focal neurological deficits observed  Gait & Station: normal  Metabolic Disorder Labs: Lab Results  Component Value Date   HGBA1C 5.9 (H) 10/07/2022   MPG 117 (H) 11/16/2013   No results found for: "PROLACTIN" Lab Results  Component Value Date   CHOL 261 (H) 10/07/2022   TRIG 173 (H) 10/07/2022   HDL 56 10/07/2022   CHOLHDL 4.7 10/07/2022   VLDL 28 05/12/2016   LDLCALC 173 (H) 10/07/2022   LDLCALC 97 04/06/2022   Lab Results  Component Value Date   TSH 1.150 09/01/2017   TSH 1.080 01/27/2017    Therapeutic Level Labs: No results found for: "LITHIUM" No results found for: "VALPROATE" No results found for: "CBMZ"  Screenings: GAD-7    Flowsheet Row Office Visit from 10/07/2022 in [facility] Video Visit from 03/18/2022 in [facility] Video Visit from 11/04/2021 in [facility] Office Visit from 10/13/2021 in [facility] Office Visit from 06/11/2021 in [facility]  Total GAD-7 Score 0 16 11 6  0      PHQ2-9    Flowsheet Row Office Visit from 10/07/2022 in [facility] Office Visit from 09/07/2022 in [facility] Video Visit from 03/18/2022 in [facility] Office Visit from 02/12/2022 in [facility] for Infectious Disease Video Visit from 11/04/2021 in [facility]  PHQ-2 Total Score 0 0 5 1 3   PHQ-9 Total Score 0 -- 20 -- 13      Flowsheet Row Video Visit from 03/18/2022 in [facility] Video Visit from 11/04/2021 in [facility] Video Visit from 05/01/2021 in [facility]  C-SSRS RISK CATEGORY No Risk No Risk No Risk       Collaboration of Care: Collaboration of Care:   Patient/Guardian was advised Release of Information must be obtained prior to any record release in order to collaborate their care with an outside provider. Patient/Guardian was advised if they have not already done so to contact the registration department to sign all necessary forms in order for [place] to release information regarding their care.   Consent: Patient/Guardian gives verbal consent for treatment and assignment of benefits for services provided during this visit. Patient/Guardian expressed understanding and agreed to proceed.   A total of 30 minutes was spent involved in face to face clinical care, chart review, and documentation.   Park Pope, MD 02/25/2023, 11:57 AM

## 2023-02-25 ENCOUNTER — Encounter (HOSPITAL_COMMUNITY): Payer: Self-pay | Admitting: Student

## 2023-03-01 ENCOUNTER — Telehealth (HOSPITAL_COMMUNITY): Payer: Self-pay | Admitting: Student

## 2023-03-01 DIAGNOSIS — G4733 Obstructive sleep apnea (adult) (pediatric): Secondary | ICD-10-CM

## 2023-03-02 NOTE — Telephone Encounter (Signed)
Ordered as an order.  Thank you, Park Pope, MD

## 2023-03-04 NOTE — Addendum Note (Signed)
Addended by: Theodoro Kos A on: 03/04/2023 02:55 PM   Modules accepted: Level of Service

## 2023-03-10 ENCOUNTER — Ambulatory Visit (INDEPENDENT_AMBULATORY_CARE_PROVIDER_SITE_OTHER): Payer: Medicaid Other | Admitting: Internal Medicine

## 2023-03-10 ENCOUNTER — Other Ambulatory Visit: Payer: Self-pay

## 2023-03-10 ENCOUNTER — Telehealth (HOSPITAL_COMMUNITY): Payer: Self-pay | Admitting: *Deleted

## 2023-03-10 ENCOUNTER — Other Ambulatory Visit (HOSPITAL_COMMUNITY)
Admission: RE | Admit: 2023-03-10 | Discharge: 2023-03-10 | Disposition: A | Discharge status: Home / Self Care | Payer: Medicaid Other | Source: Ambulatory Visit | Attending: Internal Medicine | Admitting: Internal Medicine

## 2023-03-10 ENCOUNTER — Encounter: Payer: Self-pay | Admitting: Internal Medicine

## 2023-03-10 VITALS — BP 106/76 | HR 96 | Temp 98.1°F | Ht 68.0 in | Wt 220.0 lb

## 2023-03-10 DIAGNOSIS — G4733 Obstructive sleep apnea (adult) (pediatric): Secondary | ICD-10-CM | POA: Insufficient documentation

## 2023-03-10 DIAGNOSIS — F17210 Nicotine dependence, cigarettes, uncomplicated: Secondary | ICD-10-CM | POA: Diagnosis not present

## 2023-03-10 DIAGNOSIS — Z2821 Immunization not carried out because of patient refusal: Secondary | ICD-10-CM | POA: Diagnosis not present

## 2023-03-10 DIAGNOSIS — Z720 Tobacco use: Secondary | ICD-10-CM

## 2023-03-10 DIAGNOSIS — Z113 Encounter for screening for infections with a predominantly sexual mode of transmission: Secondary | ICD-10-CM | POA: Diagnosis present

## 2023-03-10 DIAGNOSIS — B20 Human immunodeficiency virus [HIV] disease: Secondary | ICD-10-CM | POA: Insufficient documentation

## 2023-03-10 NOTE — Progress Notes (Signed)
   Subjective:    Patient ID: Shawn Brooks, male    DOB: [birth year], 47 y.o.   MRN: [id number]  HPI Amjad is here for follow up of HIV He continues on [place] and Prezista with no missed doses.  No issus with getting or taking his medication.  He continues in care withmental health.  No complaints today.    Review of Systems  Constitutional:  Negative for fatigue.  Gastrointestinal:  Negative for diarrhea and nausea.  Skin:  Negative for rash.       Objective:   Physical Exam Eyes:     General: No scleral icterus. Pulmonary:     Effort: Pulmonary effort is normal.  Neurological:     Mental Status: He is alert.    SH: + tobacco       Assessment & Plan:

## 2023-03-10 NOTE — Assessment & Plan Note (Signed)
Will screen 

## 2023-03-10 NOTE — Telephone Encounter (Signed)
Patient called and states that he called to get his sleep study done and was told that he needs an order sent in for it to be done and not just a referral.

## 2023-03-10 NOTE — Assessment & Plan Note (Signed)
Discussed the vaccine and he declined

## 2023-03-10 NOTE — Assessment & Plan Note (Signed)
I discussed the importance of cessation and he is precontemplative.

## 2023-03-10 NOTE — Assessment & Plan Note (Signed)
He continues to do well, no concerns.  Refills provided.  Previous labs reviewed with him.  Follow up in 6 months.

## 2023-03-10 NOTE — Telephone Encounter (Signed)
Ordered Sleep study.  Park Pope, MD PGY3 Psychiatry Resident

## 2023-03-11 LAB — T-HELPER CELL (CD4) - (RCID CLINIC ONLY)
CD4 % Helper T Cell: 32 % — ABNORMAL LOW (ref 33–65)
CD4 T Cell Abs: 1073 /uL (ref 400–1790)

## 2023-03-11 LAB — URINE CYTOLOGY ANCILLARY ONLY
Chlamydia: NEGATIVE
Comment: NEGATIVE
Comment: NORMAL
Neisseria Gonorrhea: NEGATIVE

## 2023-03-12 LAB — HIV-1 RNA QUANT-NO REFLEX-BLD
HIV 1 RNA Quant: NOT DETECTED {copies}/mL
HIV-1 RNA Quant, Log: NOT DETECTED {Log_copies}/mL

## 2023-03-12 LAB — RPR: RPR Ser Ql: NONREACTIVE

## 2023-03-14 ENCOUNTER — Other Ambulatory Visit (HOSPITAL_COMMUNITY): Payer: Self-pay

## 2023-03-15 ENCOUNTER — Other Ambulatory Visit: Payer: Self-pay | Admitting: Internal Medicine

## 2023-03-15 DIAGNOSIS — B20 Human immunodeficiency virus [HIV] disease: Secondary | ICD-10-CM

## 2023-03-18 ENCOUNTER — Other Ambulatory Visit (HOSPITAL_COMMUNITY): Payer: Self-pay | Admitting: Psychiatry

## 2023-03-21 LAB — COMPREHENSIVE METABOLIC PANEL
ALT: 18 IU/L (ref 0–44)
AST: 16 IU/L (ref 0–40)
Albumin: 4.6 g/dL (ref 4.1–5.1)
Alkaline Phosphatase: 105 IU/L (ref 44–121)
BUN/Creatinine Ratio: 8 — ABNORMAL LOW (ref 9–20)
BUN: 11 mg/dL (ref 6–24)
Bilirubin Total: <0.2 mg/dL (ref 0.0–1.2)
CO2: 21 mmol/L (ref 20–29)
Calcium: 9.8 mg/dL (ref 8.7–10.2)
Chloride: 101 mmol/L (ref 96–106)
Creatinine, Ser: 1.35 mg/dL — ABNORMAL HIGH (ref 0.76–1.27)
Globulin, Total: 3 g/dL (ref 1.5–4.5)
Glucose: 96 mg/dL (ref 70–99)
Potassium: 4.2 mmol/L (ref 3.5–5.2)
Sodium: 140 mmol/L (ref 134–144)
Total Protein: 7.6 g/dL (ref 6.0–8.5)
eGFR: 64 mL/min/{1.73_m2} (ref >59)

## 2023-03-21 LAB — B12 AND FOLATE PANEL
Folate: 2.7 ng/mL — ABNORMAL LOW (ref >3.0)
Vitamin B-12: 586 pg/mL (ref 232–1245)

## 2023-03-21 LAB — CBC WITH DIFFERENTIAL/PLATELET

## 2023-03-21 LAB — TSH+FREE T4
Free T4: 1.13 ng/dL (ref 0.82–1.77)
TSH: 2.37 u[IU]/mL (ref 0.450–4.500)

## 2023-03-21 LAB — SPECIMEN STATUS REPORT

## 2023-03-21 LAB — VITAMIN D 25 HYDROXY (VIT D DEFICIENCY, FRACTURES): Vit D, 25-Hydroxy: 15.7 ng/mL — ABNORMAL LOW (ref 30.0–100.0)

## 2023-03-22 ENCOUNTER — Other Ambulatory Visit (HOSPITAL_COMMUNITY): Payer: Self-pay | Admitting: Physician Assistant

## 2023-03-22 ENCOUNTER — Other Ambulatory Visit: Payer: Self-pay | Admitting: Internal Medicine

## 2023-03-22 DIAGNOSIS — F319 Bipolar disorder, unspecified: Secondary | ICD-10-CM

## 2023-03-22 DIAGNOSIS — E782 Mixed hyperlipidemia: Secondary | ICD-10-CM

## 2023-03-24 NOTE — Progress Notes (Signed)
BH MD Outpatient Progress Note  03/25/2023 10:30 AM Shawn Brooks  MRN:  [id number]  Assessment:  Shawn Brooks presents for follow-up evaluation in-person. Today, patient reports continued feelings of depression and does not feel significantly different since last adjustments of psychotropics.  Plan to continue the reduction in risperidone.  Increasing fluoxetine to 60 mg to better account for patient's depressive symptoms.  Discussed patient's lab findings including vitamin D deficiency folate deficiency.  Patient reports a presyncopal episode that spontaneously occurred which is concerning for a cardiological etiology.  I again advised patient to reduce cannabis use as this was likely affecting his mental health.  He also reports memory problems have worsened which I discussed the multiple potential etiologies of including vitamin deficiencies, long-term use of trazodone, cannabis use.  I will perform a Montreal cognitive assessment to evaluate for various aspects of cognition.  Patient to follow-up in 2 months.  Identifying Information: Shawn Brooks is a 47 y.o. male with historical diagnosis of MDD, social anxiety disorder, HIV on HAART threapy, HLD, and Vitamin D deficiency who is an established patient with [facility] for medication management.   Plan:  # Major depressive disorder-recurrent episode, severe with psychotic features Past medication trials: Zoloft Status of problem: stable Interventions: -- Decrease risperidone to 1  mg at bedtime -- Increase Prozac to 60 mg daily -- [facility] next visit -- Recommended psychotherapy  #Social Anxiety Disorder Unclear etiology. 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 -self discontinued propranolol (consider restarting at higher dose if continue to struggle with anxiety)   # Insomnia Past medication trials:  Status of problem: stable Interventions: -- Continue trazodone 300 mg nightly   # EtOH use  disorder, in early remission Past medication trials: none Status of problem: improving Interventions: -- Decreased to 1 drink per week (down from daily use) -- Psychoeducation provided regarding risks of dependency, tolerance, and withdrawal as well as safe limits of use; patient expresses intent to continue to reduce use   # Metabolic monitoring Interventions: -- SGA: 10/07/22: lipid profile LDL Cholesterol 173 (followed by PCP); Hgb A1c of 5.6>5.9 (10/07/22).   # Memory problems -Unclear etiology but differential includes pseudodementia 2/2 MDD, wernicke's encepahlopathy versus HIV dementia versus iatrogenic  #Suspected OSA -referral for sleep study   Folate deficiency Vitamin D deficiency -Vitamin D supplement -Multivitamin with folate  Patient was given contact information for behavioral health clinic and was instructed to call 911 for emergencies.   Subjective:  Chief Complaint:  Chief Complaint  Patient presents with   Medication Management    Interval History:  Patient presents as a follow-up virtually.  He reports continuing to suffer from depressive symptoms including poor concentration, depressed mood, anhedonia.  He also states that he still struggles with significant anxiety especially in social situations.  He reports that he went to [employer] this past week and had a severe panic episode.  He describes no inciting event/factors.  He describes chest palpitation and then when he bent over to pick up something that he had dropped, he had felt very dizzy and almost passed out.  I discussed the risk that this may be Cardiologic in nature and may not be completely attributed to a panic attack.  We discussed that he should discuss this with his primary care doctor and see if a cardiology referral is warranted.  He verbalized understanding.  He continues to smoke 2-3 blunts of marijuana every day which I again advised to decrease use as  this was likely doing more harm than benefit.   He verbalized understanding but appears precontemplative.  He denies SI/HI/AVH.  He denies any other somatic complaints since medication adjustments.  He reports that the decrease in risperidone did not drastically change anything so he feels comfortable continuing to decrease this.  I discussed should he experience further symptoms of hallucinations or psychosis that we may need to go back up on the risperidone and he verbalized understanding.    Visit Diagnosis:    ICD-10-CM   1. Vitamin D deficiency  E55.9 Multiple Vitamin (MULTIVITAMIN) capsule    2. 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 risperidone (RISPERDAL) 1 MG tablet    trazodone (DESYREL) 300 MG tablet    FLUoxetine (PROZAC) 20 MG capsule    3. Social anxiety disorder  F40.10 trazodone (DESYREL) 300 MG tablet    4. Folate deficiency  E53.8 Multiple Vitamin (MULTIVITAMIN) capsule        Past Psychiatric History:  Diagnoses: Historical diagnosis of bipolar 1 disorder Medication trials: Zoloft Substance use:             -- Etoh: 6 beers daily; denies history of withdrawal             -- Cannabis: 3-4 times weekly             -- Denies use of illicit drugs  Past Medical History:  Past Medical History:  Diagnosis Date   Anal condyloma    Anxiety    Blood dyscrasia    HIV   Chronic back pain    Depression    DJD (degenerative joint disease)    Gastric ulcer    GERD (gastroesophageal reflux disease)    Headache(784.0)    Hiatal hernia    HIV infection (HCC)    Hyperplastic colon polyp    Hypertension    Internal hemorrhoids     Past Surgical History:  Procedure Laterality Date   ESOPHAGOGASTRODUODENOSCOPY  03/27/2012   Procedure: ESOPHAGOGASTRODUODENOSCOPY (EGD);  Surgeon: Shirley Friar, MD;  Location: [facility];  Service: Endoscopy;  Laterality: N/A;   IRRIGATION AND DEBRIDEMENT ABSCESS N/A 03/13/2015   Procedure: IRRIGATION AND DEBRIDEMENT ABSCESS;   Surgeon: Luretha Murphy, MD;  Location: [facility];  Service: General;  Laterality: N/A;   WISDOM TOOTH EXTRACTION      Family History:  Family History  Problem Relation Age of Onset   Cancer Mother        laryngeal   Pneumonia Father    Diabetes Father    Hypertension Sister    Crohn's disease Brother    Crohn's disease Maternal Grandmother    Cancer Maternal Grandmother        patient unsure of type   Colon cancer Maternal Grandfather     Social History:  Social History   Socioeconomic History   Marital status: Divorced    Spouse name: Not on file   Number of children: 1   Years of education: Not on file   Highest education level: Not on file  Occupational History   Occupation: Disabled  Tobacco Use   Smoking status: Every Day    Current packs/day: 1.00    Types: Cigarettes   Smokeless tobacco: Never   Tobacco comments:    not ready to quit.  Vaping Use   Vaping status: Never Used  Substance and Sexual Activity   Alcohol use: Yes    Comment: socially   Drug use: Yes  Frequency: 4.0 times per week    Types: Marijuana   Sexual activity: Not Currently    Partners: Male    Comment: declined condoms  Other Topics Concern   Not on file  Social History Narrative   Not on file   Social Determinants of Health   Financial Resource Strain: Not on file  Food Insecurity: Not on file  Transportation Needs: Not on file  Physical Activity: Not on file  Stress: Not on file  Social Connections: Not on file    Allergies:  Allergies  Allergen Reactions   Chantix [Varenicline] Other (See Comments)    Bad dreams   Oxycodone Itching    Current Medications: Current Outpatient Medications  Medication Sig Dispense Refill   FLUoxetine (PROZAC) 20 MG capsule Take 3 capsules (60 mg total) by mouth daily. 90 capsule 2   Multiple Vitamin (MULTIVITAMIN) capsule Take 1 capsule by mouth daily.     albuterol (VENTOLIN HFA) 108 (90 Base) MCG/ACT inhaler Inhale 2 puffs into the  lungs every 6 (six) hours as needed for wheezing or shortness of breath. 18 g 1   darunavir (PREZISTA) 800 MG tablet TAKE 1 TABLET(800 MG) BY MOUTH DAILY 30 tablet 5   Eluxadoline (VIBERZI) 100 MG TABS TAKE 1 TABLET BY MOUTH ONE TO TWO TIMES DAILY 180 tablet 1   elvitegravir-cobicistat-emtricitabine-tenofovir (GENVOYA) 150-150-200-10 MG TABS tablet TAKE 1 TABLET BY MOUTH DAILY 30 tablet 5   FLUoxetine (PROZAC) 40 MG capsule Take 1 capsule (40 mg total) by mouth daily. 30 capsule 0   risperidone (RISPERDAL) 1 MG tablet Take 1 tablet (1 mg total) by mouth at bedtime. 30 tablet 2   rosuvastatin (CRESTOR) 5 MG tablet TAKE 1 TABLET(5 MG) BY MOUTH DAILY 90 tablet 0   trazodone (DESYREL) 300 MG tablet Take 1 tablet (300 mg total) by mouth at bedtime. 30 tablet 2   valACYclovir (VALTREX) 500 MG tablet TAKE 1 TABLET BY MOUTH TWICE DAILY 14 tablet 5   No current facility-administered medications for this visit.    ROS: Review of Systems  Objective:  Psychiatric Specialty Exam: There were no vitals taken for this visit.There is no height or weight on file to calculate BMI.  General Appearance: Casual  Eye Contact:  Good  Speech:  Clear and Coherent and Normal Rate  Volume:  Normal  Mood:  Euthymic  Affect:  Appropriate and Congruent  Thought Process:  Coherent, Goal Directed, and Linear  Orientation:  Full (Time, Place, and Person)  Thought Content: Logical   Suicidal Thoughts:  No  Homicidal Thoughts:  No  Memory:  Remote;   Good  Judgment:  Fair  Insight:  Fair  Psychomotor Activity:  Normal  Concentration:  Concentration: Good and Attention Span: Good              Assets:  Communication Skills Desire for Improvement Financial Resources/Insurance Housing Intimacy Leisure Time Physical Health Resilience Social Support Talents/Skills Transportation Vocational/Educational  ADL's:  Intact  Cognition: WNL  Sleep:  Good   PE: General: well-appearing; no acute distress   Pulm: no increased work of breathing on room air  Strength & Muscle Tone: within normal limits Neuro: no focal neurological deficits observed  Gait & Station: normal  Metabolic Disorder Labs: Lab Results  Component Value Date   HGBA1C 5.9 (H) 10/07/2022   MPG 117 (H) 11/16/2013   No results found for: "PROLACTIN" Lab Results  Component Value Date   CHOL 261 (H) 10/07/2022   TRIG 173 (H)  10/07/2022   HDL 56 10/07/2022   CHOLHDL 4.7 10/07/2022   VLDL 28 05/12/2016   LDLCALC 173 (H) 10/07/2022   LDLCALC 97 04/06/2022   Lab Results  Component Value Date   TSH 2.370 03/18/2023   TSH 1.150 09/01/2017    Therapeutic Level Labs: No results found for: "LITHIUM" No results found for: "VALPROATE" No results found for: "CBMZ"  Screenings: GAD-7    Flowsheet Row Office Visit from 10/07/2022 in [facility] Video Visit from 03/18/2022 in [facility] Video Visit from 11/04/2021 in [facility] Office Visit from 10/13/2021 in [facility] Office Visit from 06/11/2021 in [facility]  Total GAD-7 Score 0 16 11 6  0      PHQ2-9    Flowsheet Row Office Visit from 03/10/2023 in [facility] Office Visit from 10/07/2022 in [facility] Office Visit from 09/07/2022 in [facility] Video Visit from 03/18/2022 in [facility] Office Visit from 02/12/2022 in [facility]  PHQ-2 Total Score 0 0 0 5 1  PHQ-9 Total Score -- 0 -- 20 --      Flowsheet Row Video Visit from 03/18/2022 in [facility] Video Visit from 11/04/2021 in [facility] Video Visit from 05/01/2021 in [facility]  C-SSRS RISK  CATEGORY No Risk No Risk No Risk       Collaboration of Care: Collaboration of Care:   Patient/Guardian was advised Release of Information must be obtained prior to any record release in order to collaborate their care with an outside provider. Patient/Guardian was advised if they have not already done so to contact the registration department to sign all necessary forms in order for [place] to release information regarding their care.   Consent: Patient/Guardian gives verbal consent for treatment and assignment of benefits for services provided during this visit. Patient/Guardian expressed understanding and agreed to proceed.   Televisit via video: I connected with Shawn Brooks on 03/25/23 at  8:30 AM EDT by a video enabled telemedicine application and verified that I am speaking with the correct person using two identifiers.  Location: Patient: home Provider: office   I discussed the limitations of evaluation and management by telemedicine and the availability of in person appointments. The patient expressed understanding and agreed to proceed.  I discussed the assessment and treatment plan with the patient. The patient was provided an opportunity to ask questions and all were answered. The patient agreed with the plan and demonstrated an understanding of the instructions.   The patient was advised to call back or seek an in-person evaluation if the symptoms worsen or if the condition fails to improve as anticipated.  A total of 30 minutes was spent involved in face to face clinical care, chart review, and documentation.   Park Pope, MD 03/25/2023, 10:30 AM

## 2023-03-25 ENCOUNTER — Encounter (HOSPITAL_COMMUNITY): Payer: Self-pay | Admitting: Student

## 2023-03-25 ENCOUNTER — Telehealth (INDEPENDENT_AMBULATORY_CARE_PROVIDER_SITE_OTHER): Payer: Medicaid Other | Admitting: Student

## 2023-03-25 DIAGNOSIS — F4010 Social phobia, unspecified: Secondary | ICD-10-CM

## 2023-03-25 DIAGNOSIS — F333 Major depressive disorder, recurrent, severe with psychotic symptoms: Secondary | ICD-10-CM

## 2023-03-25 DIAGNOSIS — E538 Deficiency of other specified B group vitamins: Secondary | ICD-10-CM | POA: Diagnosis not present

## 2023-03-25 DIAGNOSIS — E559 Vitamin D deficiency, unspecified: Secondary | ICD-10-CM

## 2023-03-25 MED ORDER — FLUOXETINE HCL 20 MG PO CAPS
60.0000 mg | ORAL_CAPSULE | Freq: Every day | ORAL | 2 refills | Status: DC
Start: 2023-03-25 — End: 2023-06-02

## 2023-03-25 MED ORDER — MULTIVITAMINS PO CAPS: 1.0000 | ORAL_CAPSULE | Freq: Every day | ORAL | Status: DC

## 2023-03-25 MED ORDER — RISPERIDONE 1 MG PO TABS
1.0000 mg | ORAL_TABLET | Freq: Every evening | ORAL | 2 refills | Status: DC
Start: 2023-03-25 — End: 2023-06-02

## 2023-03-25 MED ORDER — TRAZODONE HCL 300 MG PO TABS
300.0000 mg | ORAL_TABLET | Freq: Every evening | ORAL | 2 refills | Status: DC
Start: 2023-03-25 — End: 2023-06-02

## 2023-04-08 ENCOUNTER — Encounter: Payer: Self-pay | Admitting: Internal Medicine

## 2023-04-08 ENCOUNTER — Ambulatory Visit: Payer: Medicaid Other | Attending: Internal Medicine | Admitting: Internal Medicine

## 2023-04-08 VITALS — BP 104/66 | HR 74 | Temp 97.8°F | Ht 68.0 in | Wt 224.0 lb

## 2023-04-08 DIAGNOSIS — E66811 Obesity, class 1: Secondary | ICD-10-CM | POA: Diagnosis not present

## 2023-04-08 DIAGNOSIS — E559 Vitamin D deficiency, unspecified: Secondary | ICD-10-CM

## 2023-04-08 DIAGNOSIS — Z860100 Personal history of colon polyps, unspecified: Secondary | ICD-10-CM

## 2023-04-08 DIAGNOSIS — F17200 Nicotine dependence, unspecified, uncomplicated: Secondary | ICD-10-CM

## 2023-04-08 DIAGNOSIS — E6609 Other obesity due to excess calories: Secondary | ICD-10-CM

## 2023-04-08 DIAGNOSIS — R7303 Prediabetes: Secondary | ICD-10-CM

## 2023-04-08 DIAGNOSIS — E538 Deficiency of other specified B group vitamins: Secondary | ICD-10-CM | POA: Insufficient documentation

## 2023-04-08 DIAGNOSIS — E782 Mixed hyperlipidemia: Secondary | ICD-10-CM

## 2023-04-08 DIAGNOSIS — F330 Major depressive disorder, recurrent, mild: Secondary | ICD-10-CM

## 2023-04-08 DIAGNOSIS — Z6834 Body mass index (BMI) 34.0-34.9, adult: Secondary | ICD-10-CM

## 2023-04-08 DIAGNOSIS — Z21 Asymptomatic human immunodeficiency virus [HIV] infection status: Secondary | ICD-10-CM

## 2023-04-08 DIAGNOSIS — Z1211 Encounter for screening for malignant neoplasm of colon: Secondary | ICD-10-CM

## 2023-04-08 LAB — POCT GLYCOSYLATED HEMOGLOBIN (HGB A1C): HbA1c, POC (controlled diabetic range): 5.7 % (ref 0.0–7.0)

## 2023-04-08 LAB — GLUCOSE, POCT (MANUAL RESULT ENTRY): POC Glucose: 119 mg/dL — AB (ref 70–99)

## 2023-04-08 MED ORDER — VITAMIN D (ERGOCALCIFEROL) 1.25 MG (50000 UNIT) PO CAPS
50000.0000 [IU] | ORAL_CAPSULE | ORAL | 1 refills | Status: DC
Start: 2023-04-08 — End: 2023-06-16

## 2023-04-08 MED ORDER — FOLIC ACID 1 MG PO TABS
1.0000 mg | ORAL_TABLET | Freq: Every day | ORAL | 1 refills | Status: DC
Start: 2023-04-08 — End: 2023-06-17

## 2023-04-08 NOTE — Progress Notes (Signed)
Patient ID: Shawn Brooks, male    DOB: 1976/01/11  MRN: [id number]  CC: Prediabetes (Prediabetes f/u. /No questions / concerns/No to flu vax.)   Subjective: Shawn Brooks is a 47 y.o. male who presents for chronic ds management. His concerns today include:  HIV, DJD,  hyperlipidemia, anxiety, bipolar 1  (followed by psychiatry ), tobacco dependence, vit D deficiency, numbness in hands (neg EMG 2019), chronic bilateral lower back pain , rectal fissure, internal hemorrhoids, PreDM.   Discussed the use of AI scribe software for clinical note transcription with the patient, who gave verbal consent to proceed.  History of Present Illness   The patient, with a history of elevated cholesterol levels, prediabetes, and overweight, presents for a follow-up visit. Six months ago, his LDL cholesterol was 173 (goal <100) and total cholesterol was 261 (goal <200). The patient was advised to restart rosuvastatin 5mg  and has done so. His weight has decreased by 15 pounds over the past six months, from 239 to 224 pounds. However, he report no significant changes  physical activity, or eating habits. He expressed financial constraints impacting his ability to afford healthier food options.  The patient's prediabetes remains stable, with no progression to full diabetes.  Recently found to have vitamin D and folate deficiencies, on labs done by his psychiatrist.  He is taking a multivitamin tablet.   He has history of HIV, which is well-managed with Genvoya, and recent levels remain undetectable. Followed by ID.  He continues to smoke is not ready to quit.  The patient also has a history of depression and anxiety, which he reports has improved. He takes his psychiatric medications daily and continue to see a behavioral health specialist regularly.   HM:  due for a repeat colonoscopy due to a history of colon polyps.      Patient Active Problem List   Diagnosis Date Noted   Obstructive sleep apnea  03/10/2023   Severe episode of recurrent major depressive disorder, with psychotic features (HCC) 02/24/2023   Social anxiety disorder 02/24/2023   Class 2 severe obesity due to excess calories with serious comorbidity and body mass index (BMI) of 35.0 to 35.9 in adult [facility]) 10/07/2022   Influenza vaccine refused 06/17/2020   Esophageal dysphagia 06/17/2020   Mixed hyperlipidemia 06/17/2020   Obesity (BMI 30.0-34.9) 06/17/2020   History of farsightedness 06/17/2020   Chronic night sweats 04/10/2020   Counseled about COVID-19 virus infection 09/18/2019   Urinary frequency 12/26/2018   Medication monitoring encounter 08/08/2018   Memory problem 09/01/2017   Vitamin D deficiency 09/01/2017   Primary osteoarthritis of left knee 05/02/2017   Prediabetes 12/22/2016   Tobacco abuse 05/31/2016   Lumbar transverse process fracture (HCC) 10/02/2015   Screening examination for sexually transmitted disease 04/04/2014   Thoracic or lumbosacral neuritis or radiculitis 01/15/2013   H/O gastrointestinal disease 01/15/2013   Lumbar radiculopathy 01/15/2013   Carcinoma in situ of anal canal 11/17/2012   Condyloma acuminatum 11/17/2012   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   AGW (anogenital warts) 11/17/2012   Hemorrhoid 09/27/2012   BRBPR (bright red blood per rectum) 07/27/2012   Prostatitis 04/12/2012   Constipation 03/09/2012   Callus 08/16/2011   Ingrown toenail 08/16/2011   Chronic low back pain 08/16/2011   Dyslipidemia 09/03/2010   Herpes simplex virus (HSV) infection 07/06/2010   Erectile dysfunction 07/06/2010   BURSITIS, KNEE 05/06/2010   Human immunodeficiency virus (HIV) disease (HCC) 04/16/2010     Current Outpatient Medications  on File Prior to Visit  Medication Sig Dispense Refill   albuterol (VENTOLIN HFA) 108 (90 Base) MCG/ACT inhaler Inhale 2 puffs into the lungs every 6 (six) hours as needed for wheezing or shortness of breath. 18 g 1   darunavir  (PREZISTA) 800 MG tablet TAKE 1 TABLET(800 MG) BY MOUTH DAILY 30 tablet 5   Eluxadoline (VIBERZI) 100 MG TABS TAKE 1 TABLET BY MOUTH ONE TO TWO TIMES DAILY 180 tablet 1   elvitegravir-cobicistat-emtricitabine-tenofovir (GENVOYA) 150-150-200-10 MG TABS tablet TAKE 1 TABLET BY MOUTH DAILY 30 tablet 5   FLUoxetine (PROZAC) 20 MG capsule Take 3 capsules (60 mg total) by mouth daily. 90 capsule 2   Multiple Vitamin (MULTIVITAMIN) capsule Take 1 capsule by mouth daily.     risperidone (RISPERDAL) 1 MG tablet Take 1 tablet (1 mg total) by mouth at bedtime. 30 tablet 2   rosuvastatin (CRESTOR) 5 MG tablet TAKE 1 TABLET(5 MG) BY MOUTH DAILY 90 tablet 0   trazodone (DESYREL) 300 MG tablet Take 1 tablet (300 mg total) by mouth at bedtime. 30 tablet 2   valACYclovir (VALTREX) 500 MG tablet TAKE 1 TABLET BY MOUTH TWICE DAILY 14 tablet 5   No current facility-administered medications on file prior to visit.    Allergies  Allergen Reactions   Chantix [Varenicline] Other (See Comments)    Bad dreams   Oxycodone Itching    Social History   Socioeconomic History   Marital status: Divorced    Spouse name: Not on file   Number of children: 1   Years of education: Not on file   Highest education level: Not on file  Occupational History   Occupation: Disabled  Tobacco Use   Smoking status: Every Day    Current packs/day: 1.00    Types: Cigarettes   Smokeless tobacco: Never   Tobacco comments:    not ready to quit.  Vaping Use   Vaping status: Never Used  Substance and Sexual Activity   Alcohol use: Yes    Comment: socially   Drug use: Yes    Frequency: 4.0 times per week    Types: Marijuana   Sexual activity: Not Currently    Partners: Male    Comment: declined condoms  Other Topics Concern   Not on file  Social History Narrative   Not on file   Social Determinants of Health   Financial Resource Strain: Not on file  Food Insecurity: Not on file  Transportation Needs: Not on file   Physical Activity: Not on file  Stress: Not on file  Social Connections: Not on file  Intimate Partner Violence: Not on file    Family History  Problem Relation Age of Onset   Cancer Mother        laryngeal   Pneumonia Father    Diabetes Father    Hypertension Sister    Crohn's disease Brother    Crohn's disease Maternal Grandmother    Cancer Maternal Grandmother        patient unsure of type   Colon cancer Maternal Grandfather     Past Surgical History:  Procedure Laterality Date   ESOPHAGOGASTRODUODENOSCOPY  03/27/2012   Procedure: ESOPHAGOGASTRODUODENOSCOPY (EGD);  Surgeon: Shirley Friar, MD;  Location: [facility];  Service: Endoscopy;  Laterality: N/A;   IRRIGATION AND DEBRIDEMENT ABSCESS N/A 03/13/2015   Procedure: IRRIGATION AND DEBRIDEMENT ABSCESS;  Surgeon: Luretha Murphy, MD;  Location: [facility];  Service: General;  Laterality: N/A;   WISDOM TOOTH EXTRACTION  ROS: Review of Systems Negative except as stated above  PHYSICAL EXAM: BP 104/66 (BP Location: Left Arm, Patient Position: Sitting, Cuff Size: Large)   Pulse 74   Temp 97.8 F (36.6 C) (Oral)   Ht 5\' 8"  (1.727 m)   Wt 224 lb (101.6 kg)   SpO2 97%   BMI 34.06 kg/m   Wt Readings from Last 3 Encounters:  04/08/23 224 lb (101.6 kg)  03/10/23 220 lb (99.8 kg)  02/25/23 220 lb (99.8 kg)    Physical Exam Physical Exam middle-age obese African-American male in NAD MEASUREMENTS: WT- 224, BMI- 34 CHEST: Lungs clear, no wheezes or crackles. CARDIOVASCULAR: Heart sounds normal, no murmurs. EXTREMITIES: No edema in legs.          04/08/2023    8:43 AM 03/10/2023    9:13 AM 10/07/2022    8:41 AM  Depression screen PHQ 2/9  Decreased Interest 3 0 0  Down, Depressed, Hopeless 2 0 0  PHQ - 2 Score 5 0 0  Altered sleeping 0  0  Tired, decreased energy 1  0  Change in appetite 0  0  Feeling bad or failure about yourself  3  0  Trouble concentrating 3  0  Moving slowly or fidgety/restless 0  0   Suicidal thoughts 1  0  PHQ-9 Score 13  0  Difficult doing work/chores Somewhat difficult         Latest Ref Rng & Units 03/18/2023   12:00 AM 02/12/2022    9:06 AM 10/13/2021    3:38 PM  CMP  Glucose 70 - 99 mg/dL 96  93  89   BUN 6 - 24 mg/dL 11  6  8    Creatinine 0.76 - 1.27 mg/dL 9.52  8.41  3.24   Sodium 134 - 144 mmol/L 140  137  135   Potassium 3.5 - 5.2 mmol/L 4.2  4.2  4.2   Chloride 96 - 106 mmol/L 101  103  97   CO2 20 - 29 mmol/L 21  27  20    Calcium 8.7 - 10.2 mg/dL 9.8  40.1  9.7   Total Protein 6.0 - 8.5 g/dL 7.6  7.6    Total Bilirubin 0.0 - 1.2 mg/dL <0.2  0.3    Alkaline Phos 44 - 121 IU/L 105     AST 0 - 40 IU/L 16  23    ALT 0 - 44 IU/L 18  17     Lipid Panel     Component Value Date/Time   CHOL 261 (H) 10/07/2022 0907   TRIG 173 (H) 10/07/2022 0907   HDL 56 10/07/2022 0907   CHOLHDL 4.7 10/07/2022 0907   CHOLHDL 4.7 08/09/2019 0922   VLDL 28 05/12/2016 0955   LDLCALC 173 (H) 10/07/2022 0907   LDLCALC  08/09/2019 7253     Comment:     . LDL cholesterol not calculated. Triglyceride levels greater than 400 mg/dL invalidate calculated LDL results. . Reference range: <100 . Desirable range <100 mg/dL for primary prevention;   <70 mg/dL for patients with CHD or diabetic patients  with > or = 2 CHD risk factors. Marland Kitchen LDL-C is now calculated using the Martin-Hopkins  calculation, which is a validated novel method providing  better accuracy than the Friedewald equation in the  estimation of LDL-C.  Horald Pollen et al. Lenox Ahr. 6644;034(74): 2061-2068  (http://education.[facility].com/faq/FAQ164)     CBC    Component Value Date/Time   WBC CANCELED 03/18/2023 0000  WBC 12.9 (H) 08/09/2019 0922   RBC CANCELED 03/18/2023 0000   RBC 4.84 08/09/2019 0922   HGB CANCELED 03/18/2023 0000   HCT CANCELED 03/18/2023 0000   PLT CANCELED 03/18/2023 0000   MCV 92 10/13/2021 1538   MCH 32.6 10/13/2021 1538   MCH 31.6 08/09/2019 0922   MCHC 35.3 10/13/2021  1538   MCHC 34.9 08/09/2019 0922   RDW 13.8 10/13/2021 1538   LYMPHSABS CANCELED 03/18/2023 0000   MONOABS 0.7 06/18/2019 0935   EOSABS CANCELED 03/18/2023 0000   BASOSABS CANCELED 03/18/2023 0000   Results for orders placed or performed in visit on 04/08/23  POCT glycosylated hemoglobin (Hb A1C)  Result Value Ref Range   Hemoglobin A1C     HbA1c POC (<> result, manual entry)     HbA1c, POC (prediabetic range)     HbA1c, POC (controlled diabetic range) 5.7 0.0 - 7.0 %  POCT glucose (manual entry)  Result Value Ref Range   POC Glucose 119 (A) 70 - 99 mg/dl    ASSESSMENT AND PLAN: 1. Prediabetes 2. Class 1 obesity due to excess calories with serious comorbidity and body mass index (BMI) of 34.0 to 34.9 in adult Commended him on weight loss Encouraged him to try to eat healthy.  He will work on incorporating fruits and vegetables into his diet. - POCT glycosylated hemoglobin (Hb A1C) - POCT glucose (manual entry)  3. Mixed hyperlipidemia Continue Crestor 5 mg daily.  He is agreeable to having lipid profile rechecked today. - Lipid panel  4. Tobacco dependence Advised to quit.  Patient not willing to give a trial of quitting just yet.  5. Vitamin D deficiency Based on his vitamin D level, I will put him on high-dose vitamin D to take once a week.  Advised to return to the lab in 3 months to have level rechecked. - Vitamin D, Ergocalciferol, (DRISDOL) 1.25 MG (50000 UNIT) CAPS capsule; Take 1 capsule (50,000 Units total) by mouth every 7 (seven) days.  Dispense: 12 capsule; Refill: 1  6. Folate deficiency - folic acid (FOLVITE) 1 MG tablet; Take 1 tablet (1 mg total) by mouth daily.  Dispense: 90 tablet; Refill: 1  7. History of colon polyps He is due for repeat colonoscopy.  Referral sent - Ambulatory referral to Gastroenterology  8. Screening for colon cancer - Ambulatory referral to Gastroenterology  9. Major depressive disorder, recurrent episode, moderate  (HCC) Plugged in with behavioral health and feels he is doing okay on his medicines.  10. Asymptomatic HIV infection, with no history of HIV-related illness (HCC) Stable and followed by ID.   Patient was given the opportunity to ask questions.  Patient verbalized understanding of the plan and was able to repeat key elements of the plan.   This documentation was completed using Paediatric nurse.  Any transcriptional errors are unintentional.  Orders Placed This Encounter  Procedures   POCT glycosylated hemoglobin (Hb A1C)   POCT glucose (manual entry)     Requested Prescriptions    No prescriptions requested or ordered in this encounter    No follow-ups on file.  Jonah Blue, MD, FACP

## 2023-04-08 NOTE — Patient Instructions (Signed)
You have vitamin D and folate deficiency.  I have sent prescriptions to your pharmacy for these vitamins.  After you have been on the vitamin D for 3 months, please return to the lab to have your level rechecked.

## 2023-04-09 ENCOUNTER — Other Ambulatory Visit: Payer: Self-pay | Admitting: Internal Medicine

## 2023-04-09 LAB — LIPID PANEL
Chol/HDL Ratio: 3.8 {ratio} (ref 0.0–5.0)
Cholesterol, Total: 200 mg/dL — ABNORMAL HIGH (ref 100–199)
HDL: 53 mg/dL (ref >39)
LDL Chol Calc (NIH): 124 mg/dL — ABNORMAL HIGH (ref 0–99)
Triglycerides: 130 mg/dL (ref 0–149)
VLDL Cholesterol Cal: 23 mg/dL (ref 5–40)

## 2023-04-09 MED ORDER — ROSUVASTATIN CALCIUM 10 MG PO TABS: 10.0000 mg | ORAL_TABLET | Freq: Every day | ORAL | 1 refills | Status: DC

## 2023-05-17 ENCOUNTER — Telehealth: Payer: Self-pay | Admitting: Internal Medicine

## 2023-05-17 NOTE — Telephone Encounter (Signed)
Inbound call from patient stating that he just got off the phone with his pharmacy and they advised him that he needs a prior authorization for  Viberzi. Please advise.

## 2023-05-18 ENCOUNTER — Telehealth: Payer: Self-pay

## 2023-05-18 NOTE — Telephone Encounter (Signed)
Pharmacy Patient Advocate Encounter   Received notification from CoverMyMeds that prior authorization for Viberzi 100 mg tablets is required/requested.   Insurance verification completed.   The patient is insured through [employer] .   Per test claim: PA required; PA started via CoverMyMeds. KEY [id number] . Waiting for clinical questions to populate.

## 2023-05-20 ENCOUNTER — Encounter (HOSPITAL_COMMUNITY): Payer: Medicaid Other | Admitting: Student

## 2023-05-31 NOTE — Telephone Encounter (Signed)
PT is calling about an update on the PA for Viberzi. Please advise.

## 2023-06-01 NOTE — Telephone Encounter (Signed)
Inbound call from patient, states insurance advised him they would not cover Viberzi, states it is not under his plan. He is either requesting samples of viberzi or if we could advise him of any patient assistance for the medication.

## 2023-06-01 NOTE — Telephone Encounter (Signed)
Informed patient we do not currently have samples of Viberzi 100 mg. Told patient I can contact the rep for [employer] and try to get him some samples while we fill out his patient assistance forms. Patient verbalized understanding. Also, informed patient that I printed the [employer] patient assistance forms if he wants to come by our to pick them up or can go to [facility] patient assist website to fill them out and bring them to me after wards to fill out our portion of the forms. Patient states he will go to the website. Informed patient I will contact him when I can get some samples.

## 2023-06-02 ENCOUNTER — Ambulatory Visit (HOSPITAL_COMMUNITY): Payer: MEDICAID | Admitting: Student

## 2023-06-02 ENCOUNTER — Other Ambulatory Visit (HOSPITAL_COMMUNITY): Payer: Self-pay

## 2023-06-02 DIAGNOSIS — F4010 Social phobia, unspecified: Secondary | ICD-10-CM | POA: Diagnosis not present

## 2023-06-02 DIAGNOSIS — F333 Major depressive disorder, recurrent, severe with psychotic symptoms: Secondary | ICD-10-CM

## 2023-06-02 MED ORDER — TRAZODONE HCL 50 MG PO TABS
250.0000 mg | ORAL_TABLET | Freq: Every evening | ORAL | 0 refills | Status: DC
Start: 2023-06-02 — End: 2023-06-27

## 2023-06-02 MED ORDER — FLUOXETINE HCL 20 MG PO CAPS
60.0000 mg | ORAL_CAPSULE | Freq: Every day | ORAL | 2 refills | Status: DC
Start: 2023-06-02 — End: 2023-08-04

## 2023-06-02 NOTE — Patient Instructions (Addendum)
-  Please see an optometrist or ophthalmologist to assess your vision (eye doctor).  -Please look into downloading CBT-I Coach   You have been referred to this sleep center for a sleep study: [facility] [address] [place],  [place]  [address] Main: [phone]

## 2023-06-02 NOTE — Progress Notes (Signed)
BH MD Outpatient Progress Note  06/02/2023 3:03 PM Shawn Brooks  MRN:  [id number]  Assessment:  Shawn Brooks presents for follow-up evaluation in-person. Today, patient reports continued feelings of depression and does not feel significantly different since last adjustments of psychotropics.  Plan to continue the reduction in risperidone as VH have been largely unchanged. Upon further investigation of VH, there is some concern whether these are more consistent with visual illusions as he has not seen an optometrist/ophthalmologist in several years and formerly wore glasses. I again advised patient to reduce cannabis use as this was likely affecting his mental health. He was amenable to decreasing cannabis use. He reports memory problems have persisted which I discussed the multiple potential etiologies of including pseudodementia, vitamin deficiencies, long-term use of trazodone, and cannabis use. MOCA today he scored 27/30 suggesting normal cognition although he did appear to have poor attention or difficulty with delayed recall. Plan to decrease trazodone to 250 mg and discontinue risperidone. I recommended psychotherapy again today as an option to treat his MDD but was ambivalent about this.  Identifying Information: Shawn Brooks is a 47 y.o. male with historical diagnosis of MDD, social anxiety disorder, HIV on HAART threapy, HLD, and Vitamin D deficiency who is an established patient with [facility] for medication management.   Plan:  # Major depressive disorder-recurrent episode, severe with psychotic features Past medication trials: Zoloft Status of problem: stable Interventions: -- Continue Prozac to 60 mg daily -- MoCA 26/30 -- Recommended psychotherapy  #Social Anxiety Disorder Unclear etiology. 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 -self discontinued propranolol (consider restarting at higher dose if continue to struggle with anxiety)   #  Insomnia Past medication trials:  Status of problem: stable Interventions: -- Decrease trazodone to 250 mg nightly   # EtOH use disorder, in early remission Past medication trials: none Status of problem: improving Interventions: -- Decreased to 2 beers per month (down from daily use) -- Psychoeducation provided regarding risks of dependency, tolerance, and withdrawal as well as safe limits of use; patient expresses intent to continue to reduce use   # Metabolic monitoring Interventions: -- SGA: 10/07/22: lipid profile LDL Cholesterol 173 (followed by PCP); Hgb A1c of 5.6>5.9 (10/07/22).   # Memory problems -Unclear etiology but differential includes pseudodementia 2/2 MDD, wernicke's encepahlopathy versus HIV dementia versus iatrogenic  #Suspected OSA -referral for sleep study   Folate deficiency Vitamin D deficiency -Vitamin D supplement -Multivitamin with folate  Patient was given contact information for behavioral health clinic and was instructed to call 911 for emergencies.   Subjective:  Chief Complaint:  No chief complaint on file.   Interval History:  Patient presents as a follow-up in person.  He reports continuing to suffer from depressive symptoms including poor concentration, depressed mood, anhedonia. He denies any further episodes of chest palpitation since last visit. He continues to smoke multiple blunts of marijuana every day which I again advised to decrease use as this was likely doing more harm than benefit especially related to his memory.  He verbalized understanding but appears precontemplative.  He denies SI/HI/AVH. He does endorse visual illusions of possible people that aren't actually there in the distance and shadows in his peripheral. He denies any other somatic complaints since medication adjustments.  MOCA was done and he scored 27/30 with problems primarily with memory recall.   Visit Diagnosis:    ICD-10-CM   1. Social anxiety disorder  F40.10  trazodone (DESYREL) 50 MG  tablet    2. 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 Ambulatory referral to Sleep Studies    trazodone (DESYREL) 50 MG tablet    FLUoxetine (PROZAC) 20 MG capsule         Past Psychiatric History:  Diagnoses: Historical diagnosis of bipolar 1 disorder Medication trials: Zoloft Substance use:             -- Etoh: Used to have 6 beers daily; denies history of withdrawal; now on 1 beer every ~2 weeks             -- Cannabis: Daily multiple blunts             -- Denies use of illicit drugs  Past Medical History:  Past Medical History:  Diagnosis Date   Anal condyloma    Anxiety    Blood dyscrasia    HIV   Chronic back pain    Depression    DJD (degenerative joint disease)    Gastric ulcer    GERD (gastroesophageal reflux disease)    Headache(784.0)    Hiatal hernia    HIV infection (HCC)    Hyperplastic colon polyp    Hypertension    Internal hemorrhoids     Past Surgical History:  Procedure Laterality Date   ESOPHAGOGASTRODUODENOSCOPY  03/27/2012   Procedure: ESOPHAGOGASTRODUODENOSCOPY (EGD);  Surgeon: Shirley Friar, MD;  Location: [facility];  Service: Endoscopy;  Laterality: N/A;   IRRIGATION AND DEBRIDEMENT ABSCESS N/A 03/13/2015   Procedure: IRRIGATION AND DEBRIDEMENT ABSCESS;  Surgeon: Luretha Murphy, MD;  Location: [facility];  Service: General;  Laterality: N/A;   WISDOM TOOTH EXTRACTION      Family History:  Family History  Problem Relation Age of Onset   Cancer Mother        laryngeal   Pneumonia Father    Diabetes Father    Hypertension Sister    Crohn's disease Brother    Crohn's disease Maternal Grandmother    Cancer Maternal Grandmother        patient unsure of type   Colon cancer Maternal Grandfather     Social History:  Social History   Socioeconomic History   Marital status: Divorced    Spouse name: Not on file   Number of children: 1   Years of education:  Not on file   Highest education level: Not on file  Occupational History   Occupation: Disabled  Tobacco Use   Smoking status: Every Day    Current packs/day: 1.00    Types: Cigarettes   Smokeless tobacco: Never   Tobacco comments:    not ready to quit.  Vaping Use   Vaping status: Never Used  Substance and Sexual Activity   Alcohol use: Yes    Comment: socially   Drug use: Yes    Frequency: 4.0 times per week    Types: Marijuana   Sexual activity: Not Currently    Partners: Male    Comment: declined condoms  Other Topics Concern   Not on file  Social History Narrative   Not on file   Social Determinants of Health   Financial Resource Strain: Not on file  Food Insecurity: Not on file  Transportation Needs: Not on file  Physical Activity: Not on file  Stress: Not on file  Social Connections: Not on file    Allergies:  Allergies  Allergen Reactions   Chantix [Varenicline] Other (See Comments)    Bad dreams  Oxycodone Itching    Current Medications: Current Outpatient Medications  Medication Sig Dispense Refill   albuterol (VENTOLIN HFA) 108 (90 Base) MCG/ACT inhaler Inhale 2 puffs into the lungs every 6 (six) hours as needed for wheezing or shortness of breath. 18 g 1   darunavir (PREZISTA) 800 MG tablet TAKE 1 TABLET(800 MG) BY MOUTH DAILY 30 tablet 5   Eluxadoline (VIBERZI) 100 MG TABS TAKE 1 TABLET BY MOUTH ONE TO TWO TIMES DAILY 180 tablet 1   elvitegravir-cobicistat-emtricitabine-tenofovir (GENVOYA) 150-150-200-10 MG TABS tablet TAKE 1 TABLET BY MOUTH DAILY 30 tablet 5   FLUoxetine (PROZAC) 20 MG capsule Take 3 capsules (60 mg total) by mouth daily. 90 capsule 2   folic acid (FOLVITE) 1 MG tablet Take 1 tablet (1 mg total) by mouth daily. 90 tablet 1   Multiple Vitamin (MULTIVITAMIN) capsule Take 1 capsule by mouth daily.     rosuvastatin (CRESTOR) 10 MG tablet Take 1 tablet (10 mg total) by mouth daily. 90 tablet 1   trazodone (DESYREL) 50 MG tablet Take 5  tablets (250 mg total) by mouth at bedtime. 150 tablet 0   valACYclovir (VALTREX) 500 MG tablet TAKE 1 TABLET BY MOUTH TWICE DAILY 14 tablet 5   Vitamin D, Ergocalciferol, (DRISDOL) 1.25 MG (50000 UNIT) CAPS capsule Take 1 capsule (50,000 Units total) by mouth every 7 (seven) days. 12 capsule 1   No current facility-administered medications for this visit.    ROS: Review of Systems  Objective:  Psychiatric Specialty Exam: There were no vitals taken for this visit.There is no height or weight on file to calculate BMI.  General Appearance: Casual  Eye Contact:  Good  Speech:  Clear and Coherent and Normal Rate  Volume:  Normal  Mood:  Depressed  Affect:  Appropriate and Congruent  Thought Process:  Coherent, Goal Directed, and Linear  Orientation:  Full (Time, Place, and Person)  Thought Content: Logical   Suicidal Thoughts:  No  Homicidal Thoughts:  No  Memory:  Remote;   Good  Judgment:  Fair  Insight:  Fair  Psychomotor Activity:  Normal  Concentration:  Concentration: Good and Attention Span: Good              Assets:  Communication Skills Desire for Improvement Financial Resources/Insurance Housing Intimacy Leisure Time Physical Health Resilience Social Support Talents/Skills Transportation Vocational/Educational  ADL's:  Intact  Cognition: WNL  Sleep:  Good   PE: General: well-appearing; no acute distress  Pulm: no increased work of breathing on room air  Strength & Muscle Tone: within normal limits Neuro: no focal neurological deficits observed  Gait & Station: normal  Metabolic Disorder Labs: Lab Results  Component Value Date   HGBA1C 5.7 04/08/2023   MPG 117 (H) 11/16/2013   No results found for: "PROLACTIN" Lab Results  Component Value Date   CHOL 200 (H) 04/08/2023   TRIG 130 04/08/2023   HDL 53 04/08/2023   CHOLHDL 3.8 04/08/2023   VLDL 28 05/12/2016   LDLCALC 124 (H) 04/08/2023   LDLCALC 173 (H) 10/07/2022   Lab Results   Component Value Date   TSH 2.370 03/18/2023   TSH 1.150 09/01/2017    Therapeutic Level Labs: No results found for: "LITHIUM" No results found for: "VALPROATE" No results found for: "CBMZ"  Screenings: GAD-7    Flowsheet Row Office Visit from 04/08/2023 in [facility] Dept Of [place]. [facility] Office Visit from 10/07/2022 in [facility]  [place] - [facility] Of [place]. [facility] Video Visit from 03/18/2022 in [facility] Video Visit from 11/04/2021 in [facility] Office Visit from 10/13/2021 in [facility]  Total GAD-7 Score 15 0 16 11 6       PHQ2-9    Flowsheet Row Office Visit from 04/08/2023 in [facility] [facility] - A Dept Of [place]. [facility] Office Visit from 03/10/2023 in [facility] - A Dept Of [place]. [facility] Office Visit from 10/07/2022 in [facility] [place] - A Dept Of Eligha Bridegroom. [facility] Office Visit from 09/07/2022 in [facility] - A Dept Of [place]. [facility] Video Visit from 03/18/2022 in [facility]  PHQ-2 Total Score 5 0 0 0 5  PHQ-9 Total Score 13 -- 0 -- 20      Flowsheet Row Video Visit from 03/18/2022 in [facility] Video Visit from 11/04/2021 in [facility] Video Visit from 05/01/2021 in [facility]  C-SSRS RISK CATEGORY No Risk No Risk No Risk       Collaboration of Care: Collaboration of Care:   Patient/Guardian was advised Release of Information must be obtained prior to any record release in order to collaborate their care with an outside provider. Patient/Guardian was advised if they have not already done so to contact the registration department to sign all necessary forms in order for [place]  to release information regarding their care.   Consent: Patient/Guardian gives verbal consent for treatment and assignment of benefits for services provided during this visit. Patient/Guardian expressed understanding and agreed to proceed.   A total of 30 minutes was spent involved in face to face clinical care, chart review, and documentation.   Park Pope, MD 06/02/2023, 3:03 PM

## 2023-06-06 ENCOUNTER — Other Ambulatory Visit (HOSPITAL_COMMUNITY): Payer: Self-pay

## 2023-06-06 ENCOUNTER — Telehealth (HOSPITAL_COMMUNITY): Payer: Self-pay | Admitting: Student

## 2023-06-06 DIAGNOSIS — G4733 Obstructive sleep apnea (adult) (pediatric): Secondary | ICD-10-CM

## 2023-06-06 NOTE — Telephone Encounter (Signed)
Ordered sleep study

## 2023-06-06 NOTE — Telephone Encounter (Signed)
PA request has been Approved. New Encounter created for follow up. For additional info see Pharmacy Prior Auth telephone encounter from 05/18/2023.

## 2023-06-06 NOTE — Telephone Encounter (Signed)
Pharmacy Patient Advocate Encounter  Received notification from [facility] that Prior Authorization for Viberzi 100MG  has been APPROVED from 06/01/2023 to 05/31/2024 . This was filled on 06/01/23 at the patients pharmacy so unable to see copay.

## 2023-06-06 NOTE — Telephone Encounter (Signed)
Informed patient that his Dennison Nancy has been approved through his insurance according to our PA team. Informed patient of the approval and to check with his pharmacy to find out the co-pay amount. Patient verbalized understanding.

## 2023-06-14 ENCOUNTER — Telehealth: Payer: Self-pay | Admitting: Internal Medicine

## 2023-06-14 NOTE — Telephone Encounter (Signed)
Spoke with patient confirming previsit appointment for Friday.  However, patient is complaining of hemorroids or "something not being quite right back there" and being in a lot of pain.  As there are no soon appointment, he asked that a nurse call him and give him advice about what he can do between now and his procedure.  Please call patient and advise.  Thank you.

## 2023-06-15 ENCOUNTER — Other Ambulatory Visit: Payer: Self-pay | Admitting: Internal Medicine

## 2023-06-15 DIAGNOSIS — E559 Vitamin D deficiency, unspecified: Secondary | ICD-10-CM

## 2023-06-15 NOTE — Telephone Encounter (Signed)
Pt states he has had an issue with his bowels being "locked up." Reports he has not had a good BM in 7 days, had a small one yesterday and it was painful. Having issues with rectal pain. Discussed with pt that we recommend miralax 1-3 doses daily for constipation. Pt scheduled to see Doug Sou PA 06/17/23 at 2:30pm. Pt aware of appt.

## 2023-06-15 NOTE — Telephone Encounter (Signed)
PT returning call to get update on previous message. Please advise

## 2023-06-16 NOTE — Telephone Encounter (Signed)
Requested medication (s) are due for refill today: No  Requested medication (s) are on the active medication list: Yes  Last refill:  04/08/23  Future visit scheduled: No  Notes to clinic:  Manual review.    Requested Prescriptions  Pending Prescriptions Disp Refills   Vitamin D, Ergocalciferol, (DRISDOL) 1.25 MG (50000 UNIT) CAPS capsule [Pharmacy Med Name: VITAMIN D2 50,000IU (ERGO) CAP RX] 12 capsule 1    Sig: TAKE 1 CAPSULE BY MOUTH EVERY 7 DAYS     Endocrinology:  Vitamins - Vitamin D Supplementation 2 Failed - 06/16/2023  7:57 AM      Failed - Manual Review: Route requests for 50,000 IU strength to the provider      Failed - Vitamin D in normal range and within 360 days    Vit D, 25-Hydroxy  Date Value Ref Range Status  03/18/2023 15.7 (L) 30.0 - 100.0 ng/mL Final    Comment:    Vitamin D deficiency has been defined by the Institute of Medicine and an Endocrine Society practice guideline as a level of serum 25-OH vitamin D less than 20 ng/mL (1,2). The Endocrine Society went on to further define vitamin D insufficiency as a level between 21 and 29 ng/mL (2). 1. IOM (Institute of Medicine). 2010. Dietary reference    intakes for calcium and D. [place]: The    [employer]. 2. Holick MF, Binkley [place], Bischoff-Ferrari HA, et al.    Evaluation, treatment, and prevention of vitamin D    deficiency: an Endocrine Society clinical practice    guideline. JCEM. 2011 Jul; 96(7):1911-30.          Passed - Ca in normal range and within 360 days    Calcium  Date Value Ref Range Status  03/18/2023 9.8 8.7 - 10.2 mg/dL Final   Calcium, Ion  Date Value Ref Range Status  03/26/2011 1.17 1.12 - 1.32 mmol/L Final         Passed - Valid encounter within last 12 months    Recent Outpatient Visits           2 months ago Prediabetes   [place] [facility] [place] - [facility] Dept Of [place]. [facility] Jonah Blue B, MD   8 months ago Class 2 severe  obesity with serious comorbidity and body mass index (BMI) of 36.0 to 36.9 in adult, unspecified obesity type [facility])   [facility] [place] [facility] Dept Of [place]. [facility] Marcine Matar, MD   1 year ago Mixed hyperlipidemia   [place] [place] - A Dept Of [place]. [facility] Marcine Matar, MD   1 year ago Nausea and vomiting, unspecified vomiting type   [facility], Kasandra Knudsen, [place]   2 years ago Mixed hyperlipidemia    [facility] [place] - A Dept Of [place]. [facility] Marcine Matar, MD       Future Appointments             In 2 months Comer, Belia Heman, MD [facility] - A Dept Of [place]. [facility]   In 3 months Laural Benes, Binnie Rail, MD [facility] [place] - A Dept Of [place]igha Bridegroom. [facility]

## 2023-06-17 ENCOUNTER — Ambulatory Visit (INDEPENDENT_AMBULATORY_CARE_PROVIDER_SITE_OTHER): Payer: MEDICAID | Admitting: Gastroenterology

## 2023-06-17 ENCOUNTER — Ambulatory Visit (AMBULATORY_SURGERY_CENTER): Payer: MEDICAID

## 2023-06-17 ENCOUNTER — Encounter: Payer: Self-pay | Admitting: Gastroenterology

## 2023-06-17 VITALS — Ht 68.0 in | Wt 215.0 lb

## 2023-06-17 VITALS — BP 136/80 | HR 120 | Ht 68.0 in | Wt 223.0 lb

## 2023-06-17 DIAGNOSIS — K5900 Constipation, unspecified: Secondary | ICD-10-CM | POA: Diagnosis not present

## 2023-06-17 DIAGNOSIS — Z860100 Personal history of colon polyps, unspecified: Secondary | ICD-10-CM

## 2023-06-17 DIAGNOSIS — K602 Anal fissure, unspecified: Secondary | ICD-10-CM | POA: Insufficient documentation

## 2023-06-17 MED ORDER — HYDROCODONE-ACETAMINOPHEN 5-325 MG PO TABS: 1.0000 | ORAL_TABLET | Freq: Three times a day (TID) | ORAL | 0 refills | Status: DC | PRN

## 2023-06-17 MED ORDER — NA SULFATE-K SULFATE-MG SULF 17.5-3.13-1.6 GM/177ML PO SOLN: 1.0000 | Freq: Once | ORAL | 0 refills | Status: AC

## 2023-06-17 MED ORDER — AMBULATORY NON FORMULARY MEDICATION: 1 refills | Status: AC

## 2023-06-17 NOTE — Progress Notes (Signed)
Pre visit completed via phone call; Patient verified name, DOB, and address; No egg or soy allergy known to patient;  No issues known to pt with past sedation with any surgeries or procedures; Patient denies ever being told they had issues or difficulty with intubation;  No FH of Malignant Hyperthermia; Pt is not on diet pills; Pt is not on home 02;  Pt is not on blood thinners;  Pt reports issues with constipation- patient patient advised to increase oral fluids, veggies/fruits, activity; patient reports he takes Miralax daily for assistance with constipation; No A fib or A flutter; Have any cardiac testing pending--NO Insurance verified during PV appt--- [employer]  Pt can ambulate without assistance;  Pt denies use of chewing tobacco; Discussed diabetic/weight loss medication holds; Discussed NSAID holds; Checked BMI to be less than 50; Pt instructed to use Singlecare.com or GoodRx for a price reduction on prep;  Patient's chart reviewed by Cathlyn Parsons CNRA prior to previsit and patient appropriate for the [facility]; Pre visit completed and red dot placed by patient's name on their procedure day (on provider's schedule);    Instructions sent to MyChart as well as printed and placed on the 3rd floor for patient to pick up;

## 2023-06-17 NOTE — Progress Notes (Signed)
06/17/2023 Shawn Brooks [id number] [birth year]   HISTORY OF PRESENT ILLNESS: This is a 47 year old male who is a patient of Dr. Lauro Franklin.  He has issues with chronic diarrhea and had been on Viberzi.  Says that has a history of anal fissure that was treated with diltiazem.  At his last visit with Dr. Rhea Belton in February 2024 he reported no issues with his fissure.  He is here today though with recurrent complaints of anal pain.  He says that this started last Friday.  Severe pain.  Denies any rectal bleeding.  He says that he started to get constipated so he stopped his Viberzi a few days ago and has been taking MiraLAX.  Tells me has not had intercourse and about a year and a half so no trauma issue.  He actually just had a previsit this morning and scheduled for colonoscopy with Dr. Rhea Belton in mid January for his history of adenomatous colon polyps and AIN.  Past Medical History:  Diagnosis Date   Anal condyloma    Anxiety    Blood dyscrasia    HIV   Chronic back pain    Constipation    takes miralax daily   Depression    on meds   DJD (degenerative joint disease)    Gastric ulcer    GERD (gastroesophageal reflux disease)    Headache(784.0)    Hiatal hernia    HIV infection (HCC)    on meds   Hyperplastic colon polyp    Hypertension    on meds   Internal hemorrhoids    Past Surgical History:  Procedure Laterality Date   COLONOSCOPY  2019   JMP-MAC-prep good-polyps+   ESOPHAGOGASTRODUODENOSCOPY  03/27/2012   Procedure: ESOPHAGOGASTRODUODENOSCOPY (EGD);  Surgeon: Shirley Friar, MD;  Location[facility];  Service: Endoscopy;  Laterality: N/A;   IRRIGATION AND DEBRIDEMENT ABSCESS N/A 03/13/2015   Procedure: IRRIGATION AND DEBRIDEMENT ABSCESS;  Surgeon: Luretha Murphy, MD;  Location: [facility];  Service: General;  Laterality: N/A;   WISDOM TOOTH EXTRACTION      reports that he has been smoking cigarettes. He started smoking about 33 years ago. He has a 17 pack-year  smoking history. He has never used smokeless tobacco. He reports current alcohol use. He reports current drug use. Frequency: 7.00 times per week. Drug: Marijuana. family history includes Cancer in his maternal grandmother; Colon cancer in his maternal grandfather; Crohn's disease in his brother and maternal grandmother; Diabetes in his father; Esophageal cancer (age of onset: 60) in his mother; Hypertension in his sister; Pneumonia in his father. Allergies  Allergen Reactions   Chantix [Varenicline] Other (See Comments)    Bad dreams   Oxycodone Itching      Outpatient Encounter Medications as of 06/17/2023  Medication Sig   albuterol (VENTOLIN HFA) 108 (90 Base) MCG/ACT inhaler Inhale 2 puffs into the lungs every 6 (six) hours as needed for wheezing or shortness of breath.   darunavir (PREZISTA) 800 MG tablet TAKE 1 TABLET(800 MG) BY MOUTH DAILY   Eluxadoline (VIBERZI) 100 MG TABS TAKE 1 TABLET BY MOUTH ONE TO TWO TIMES DAILY   elvitegravir-cobicistat-emtricitabine-tenofovir (GENVOYA) 150-150-200-10 MG TABS tablet TAKE 1 TABLET BY MOUTH DAILY   FLUoxetine (PROZAC) 20 MG capsule Take 3 capsules (60 mg total) by mouth daily.   Multiple Vitamin (MULTIVITAMIN) capsule Take 1 capsule by mouth daily.   polyethylene glycol (MIRALAX / GLYCOLAX) 17 g packet Take 17 g by mouth daily.   rosuvastatin (CRESTOR) 10  MG tablet Take 1 tablet (10 mg total) by mouth daily.   trazodone (DESYREL) 50 MG tablet Take 5 tablets (250 mg total) by mouth at bedtime.   valACYclovir (VALTREX) 500 MG tablet TAKE 1 TABLET BY MOUTH TWICE DAILY (Patient taking differently: Take 500 mg by mouth 2 (two) times daily as needed. TAKE 1 TABLET BY MOUTH TWICE DAILY)   Vitamin D, Ergocalciferol, (DRISDOL) 1.25 MG (50000 UNIT) CAPS capsule TAKE 1 CAPSULE BY MOUTH EVERY 7 DAYS   Na Sulfate-K Sulfate-Mg Sulf 17.5-3.13-1.6 GM/177ML SOLN Take 1 kit by mouth once for 1 dose. Generic okay; Please use GoodRx or SingleCare coupon code for  price reduction if able; (Patient not taking: Reported on 06/17/2023)   No facility-administered encounter medications on file as of 06/17/2023.    REVIEW OF SYSTEMS  : All other systems reviewed and negative except where noted in the History of Present Illness.   PHYSICAL EXAM: BP 136/80   Pulse (!) 120   Ht 5\' 8"  (1.727 m)   Wt 223 lb (101.2 kg)   BMI 33.91 kg/m  General: Well developed male in no acute distress Head: Normocephalic and atraumatic Eyes:  Sclerae anicteric, conjunctiva pink. Ears: Normal auditory acuity. Rectal:  No external hemorrhoids noted.  Posterior anal fissure noted that was very tender on exam. Musculoskeletal: Symmetrical with no gross deformities  Neurological: Alert oriented x 4, grossly non-focal Psychological:  Alert and cooperative. Normal mood and affect  ASSESSMENT AND PLAN: *Anal fissure:  Recurrent, had a fissure in the past that resolved with diltiazem.  Was having constipation the past few days so stopped his Viberzi and is using Miralax.  Will renew his diltiazem with lidocaine to use 2-3 times daily for 6-8 weeks.  He asked for something for pain.  I told him that I would give a very small amount of hydrocodone but that it can constipate so he needs to use it sparingly.  Prescriptions sent to pharmacy.  Needs to keep stools soft.  He can do sitz baths.  He is actually scheduled for colonoscopy in about a month with Dr. Rhea Belton for history of colon polyps.   CC:  Marcine Matar, MD

## 2023-06-17 NOTE — Patient Instructions (Signed)
We have sent the following medications to your pharmacy for you to pick up at your convenience: Hydrocodone   We have sent a prescription for Diltiazem 2% gel with Lidocaine 5% to [facility] for you. Using your index finger, you should apply a small amount of medication inside the rectum up to your first knuckle/joint three times daily x 6-8 weeks.  [facility]'s information is below: Address: [address], [place], [place] [address]  [phone]  *Please DO NOT go directly from our office to pick up this medication! Give the pharmacy 1 day to process the prescription as this is compounded and takes time to make.  _______________________________________________________  If your blood pressure at your visit was 140/90 or greater, please contact your primary care physician to follow up on this.  _______________________________________________________  If you are age 49 or older, your body mass index should be between 23-30. Your Body mass index is 33.91 kg/m. If this is out of the aforementioned range listed, please consider follow up with your Primary Care Provider.  If you are age 36 or younger, your body mass index should be between 19-25. Your Body mass index is 33.91 kg/m. If this is out of the aformentioned range listed, please consider follow up with your Primary Care Provider.   ________________________________________________________  The [facility] providers would like to encourage you to use [facility] to communicate with providers for non-urgent requests or questions.  Due to long hold times on the telephone, sending your provider a message by [facility] may be a faster and more efficient way to get a response.  Please allow 48 business hours for a response.  Please remember that this is for non-urgent requests.  _______________________________________________________

## 2023-06-19 NOTE — Progress Notes (Signed)
Addendum: Reviewed and agree with assessment and management plan. Kadijah Shamoon M, MD  

## 2023-06-20 ENCOUNTER — Telehealth: Payer: Self-pay | Admitting: Gastroenterology

## 2023-06-23 ENCOUNTER — Inpatient Hospital Stay (HOSPITAL_BASED_OUTPATIENT_CLINIC_OR_DEPARTMENT_OTHER)
Admission: EM | Admit: 2023-06-23 | Discharge: 2023-06-27 | DRG: 970 | Disposition: A | Discharge status: Home / Self Care | Payer: MEDICAID | Attending: Internal Medicine | Admitting: Internal Medicine

## 2023-06-23 ENCOUNTER — Emergency Department (HOSPITAL_BASED_OUTPATIENT_CLINIC_OR_DEPARTMENT_OTHER): Payer: MEDICAID

## 2023-06-23 ENCOUNTER — Encounter (HOSPITAL_BASED_OUTPATIENT_CLINIC_OR_DEPARTMENT_OTHER): Payer: Self-pay | Admitting: Emergency Medicine

## 2023-06-23 DIAGNOSIS — E66811 Obesity, class 1: Secondary | ICD-10-CM | POA: Diagnosis present

## 2023-06-23 DIAGNOSIS — G4733 Obstructive sleep apnea (adult) (pediatric): Secondary | ICD-10-CM | POA: Diagnosis present

## 2023-06-23 DIAGNOSIS — Z885 Allergy status to narcotic agent status: Secondary | ICD-10-CM

## 2023-06-23 DIAGNOSIS — K612 Anorectal abscess: Secondary | ICD-10-CM | POA: Diagnosis present

## 2023-06-23 DIAGNOSIS — Z79899 Other long term (current) drug therapy: Secondary | ICD-10-CM

## 2023-06-23 DIAGNOSIS — Z6833 Body mass index (BMI) 33.0-33.9, adult: Secondary | ICD-10-CM

## 2023-06-23 DIAGNOSIS — E669 Obesity, unspecified: Secondary | ICD-10-CM | POA: Diagnosis present

## 2023-06-23 DIAGNOSIS — N179 Acute kidney failure, unspecified: Secondary | ICD-10-CM

## 2023-06-23 DIAGNOSIS — K5900 Constipation, unspecified: Secondary | ICD-10-CM | POA: Diagnosis present

## 2023-06-23 DIAGNOSIS — G8929 Other chronic pain: Secondary | ICD-10-CM | POA: Diagnosis present

## 2023-06-23 DIAGNOSIS — L0231 Cutaneous abscess of buttock: Secondary | ICD-10-CM | POA: Diagnosis present

## 2023-06-23 DIAGNOSIS — B20 Human immunodeficiency virus [HIV] disease: Secondary | ICD-10-CM | POA: Diagnosis present

## 2023-06-23 DIAGNOSIS — F17210 Nicotine dependence, cigarettes, uncomplicated: Secondary | ICD-10-CM | POA: Diagnosis present

## 2023-06-23 DIAGNOSIS — E782 Mixed hyperlipidemia: Secondary | ICD-10-CM | POA: Diagnosis present

## 2023-06-23 DIAGNOSIS — K610 Anal abscess: Principal | ICD-10-CM | POA: Diagnosis present

## 2023-06-23 DIAGNOSIS — A630 Anogenital (venereal) warts: Secondary | ICD-10-CM | POA: Diagnosis present

## 2023-06-23 DIAGNOSIS — Z8711 Personal history of peptic ulcer disease: Secondary | ICD-10-CM

## 2023-06-23 DIAGNOSIS — Z720 Tobacco use: Secondary | ICD-10-CM | POA: Diagnosis present

## 2023-06-23 DIAGNOSIS — M549 Dorsalgia, unspecified: Secondary | ICD-10-CM | POA: Diagnosis present

## 2023-06-23 DIAGNOSIS — Z800 Family history of malignant neoplasm of digestive organs: Secondary | ICD-10-CM

## 2023-06-23 DIAGNOSIS — Z8249 Family history of ischemic heart disease and other diseases of the circulatory system: Secondary | ICD-10-CM

## 2023-06-23 DIAGNOSIS — D649 Anemia, unspecified: Secondary | ICD-10-CM | POA: Diagnosis present

## 2023-06-23 DIAGNOSIS — E878 Other disorders of electrolyte and fluid balance, not elsewhere classified: Secondary | ICD-10-CM | POA: Diagnosis present

## 2023-06-23 DIAGNOSIS — B009 Herpesviral infection, unspecified: Secondary | ICD-10-CM | POA: Diagnosis present

## 2023-06-23 DIAGNOSIS — K219 Gastro-esophageal reflux disease without esophagitis: Secondary | ICD-10-CM | POA: Diagnosis present

## 2023-06-23 DIAGNOSIS — R7303 Prediabetes: Secondary | ICD-10-CM | POA: Diagnosis present

## 2023-06-23 DIAGNOSIS — Z860101 Personal history of adenomatous and serrated colon polyps: Secondary | ICD-10-CM

## 2023-06-23 DIAGNOSIS — Z809 Family history of malignant neoplasm, unspecified: Secondary | ICD-10-CM

## 2023-06-23 DIAGNOSIS — R339 Retention of urine, unspecified: Secondary | ICD-10-CM

## 2023-06-23 DIAGNOSIS — A419 Sepsis, unspecified organism: Principal | ICD-10-CM | POA: Diagnosis present

## 2023-06-23 DIAGNOSIS — Z79624 Long term (current) use of inhibitors of nucleotide synthesis: Secondary | ICD-10-CM

## 2023-06-23 DIAGNOSIS — L03317 Cellulitis of buttock: Secondary | ICD-10-CM | POA: Diagnosis present

## 2023-06-23 DIAGNOSIS — Z8379 Family history of other diseases of the digestive system: Secondary | ICD-10-CM

## 2023-06-23 DIAGNOSIS — F4010 Social phobia, unspecified: Secondary | ICD-10-CM | POA: Diagnosis present

## 2023-06-23 DIAGNOSIS — Z888 Allergy status to other drugs, medicaments and biological substances status: Secondary | ICD-10-CM

## 2023-06-23 DIAGNOSIS — E876 Hypokalemia: Secondary | ICD-10-CM | POA: Diagnosis present

## 2023-06-23 DIAGNOSIS — R338 Other retention of urine: Secondary | ICD-10-CM

## 2023-06-23 DIAGNOSIS — Z860100 Personal history of colon polyps, unspecified: Secondary | ICD-10-CM

## 2023-06-23 DIAGNOSIS — Z836 Family history of other diseases of the respiratory system: Secondary | ICD-10-CM

## 2023-06-23 DIAGNOSIS — Z833 Family history of diabetes mellitus: Secondary | ICD-10-CM

## 2023-06-23 DIAGNOSIS — I10 Essential (primary) hypertension: Secondary | ICD-10-CM | POA: Diagnosis present

## 2023-06-23 LAB — CBC WITH DIFFERENTIAL/PLATELET
Abs Immature Granulocytes: 0.32 10*3/uL — ABNORMAL HIGH (ref 0.00–0.07)
Basophils Absolute: 0.1 10*3/uL (ref 0.0–0.1)
Basophils Relative: 0 %
Eosinophils Absolute: 0 10*3/uL (ref 0.0–0.5)
Eosinophils Relative: 0 %
HCT: 39.1 % (ref 39.0–52.0)
Hemoglobin: 13.6 g/dL (ref 13.0–17.0)
Immature Granulocytes: 1 %
Lymphocytes Relative: 7 %
Lymphs Abs: 2 10*3/uL (ref 0.7–4.0)
MCH: 30.2 pg (ref 26.0–34.0)
MCHC: 34.8 g/dL (ref 30.0–36.0)
MCV: 86.7 fL (ref 80.0–100.0)
Monocytes Absolute: 1.2 10*3/uL — ABNORMAL HIGH (ref 0.1–1.0)
Monocytes Relative: 4 %
Neutro Abs: 25.6 10*3/uL — ABNORMAL HIGH (ref 1.7–7.7)
Neutrophils Relative %: 88 %
Platelets: 266 10*3/uL (ref 150–400)
RBC: 4.51 MIL/uL (ref 4.22–5.81)
RDW: 13.4 % (ref 11.5–15.5)
WBC: 29.3 10*3/uL — ABNORMAL HIGH (ref 4.0–10.5)
nRBC: 0 % (ref 0.0–0.2)

## 2023-06-23 LAB — COMPREHENSIVE METABOLIC PANEL
ALT: 13 U/L (ref 0–44)
AST: 24 U/L (ref 15–41)
Albumin: 3.9 g/dL (ref 3.5–5.0)
Alkaline Phosphatase: 106 U/L (ref 38–126)
Anion gap: 15 (ref 5–15)
BUN: 19 mg/dL (ref 6–20)
CO2: 27 mmol/L (ref 22–32)
Calcium: 9.8 mg/dL (ref 8.9–10.3)
Chloride: 93 mmol/L — ABNORMAL LOW (ref 98–111)
Creatinine, Ser: 1.36 mg/dL — ABNORMAL HIGH (ref 0.61–1.24)
GFR, Estimated: >60 mL/min (ref >60)
Glucose, Bld: 108 mg/dL — ABNORMAL HIGH (ref 70–99)
Potassium: 3.3 mmol/L — ABNORMAL LOW (ref 3.5–5.1)
Sodium: 135 mmol/L (ref 135–145)
Total Bilirubin: 0.7 mg/dL (ref <1.2)
Total Protein: 8 g/dL (ref 6.5–8.1)

## 2023-06-23 LAB — APTT: aPTT: 33 s (ref 24–36)

## 2023-06-23 LAB — LACTIC ACID, PLASMA: Lactic Acid, Venous: 1.9 mmol/L (ref 0.5–1.9)

## 2023-06-23 LAB — PROTIME-INR
INR: 1.2 (ref 0.8–1.2)
Prothrombin Time: 15.5 s — ABNORMAL HIGH (ref 11.4–15.2)

## 2023-06-23 MED ORDER — SODIUM CHLORIDE 0.9 % IV SOLN
2.0000 g | Freq: Once | INTRAVENOUS | Status: AC
  Administered 2023-06-23: 2 g via INTRAVENOUS
  Filled 2023-06-23: qty 20

## 2023-06-23 MED ORDER — IOHEXOL 300 MG/ML  SOLN
100.0000 mL | Freq: Once | INTRAMUSCULAR | Status: AC | PRN
  Administered 2023-06-23: 100 mL via INTRAVENOUS

## 2023-06-23 MED ORDER — HYDROMORPHONE HCL 1 MG/ML IJ SOLN
1.0000 mg | Freq: Once | INTRAMUSCULAR | Status: AC
  Administered 2023-06-23: 1 mg via INTRAVENOUS
  Filled 2023-06-23: qty 1

## 2023-06-23 MED ORDER — ONDANSETRON HCL 4 MG/2ML IJ SOLN
4.0000 mg | Freq: Once | INTRAMUSCULAR | Status: AC
  Administered 2023-06-23: 4 mg via INTRAVENOUS
  Filled 2023-06-23: qty 2

## 2023-06-23 MED ORDER — MORPHINE SULFATE (PF) 4 MG/ML IV SOLN
4.0000 mg | Freq: Once | INTRAVENOUS | Status: AC
  Administered 2023-06-23: 4 mg via INTRAVENOUS
  Filled 2023-06-23: qty 1

## 2023-06-23 MED ORDER — LACTATED RINGERS IV SOLN: INTRAVENOUS | Status: AC

## 2023-06-23 MED ORDER — VANCOMYCIN HCL IN DEXTROSE 1-5 GM/200ML-% IV SOLN
1000.0000 mg | Freq: Once | INTRAVENOUS | Status: AC
  Administered 2023-06-23: 1000 mg via INTRAVENOUS
  Filled 2023-06-23: qty 200

## 2023-06-23 MED ORDER — VANCOMYCIN HCL IN DEXTROSE 1-5 GM/200ML-% IV SOLN
1000.0000 mg | Freq: Once | INTRAVENOUS | Status: AC
  Administered 2023-06-24: 1000 mg via INTRAVENOUS
  Filled 2023-06-23: qty 200

## 2023-06-23 MED ORDER — METRONIDAZOLE 500 MG/100ML IV SOLN
500.0000 mg | Freq: Once | INTRAVENOUS | Status: AC
  Administered 2023-06-23: 500 mg via INTRAVENOUS
  Filled 2023-06-23: qty 100

## 2023-06-23 NOTE — Sepsis Progress Note (Signed)
Elink monitoring for the code sepsis protocol.  

## 2023-06-23 NOTE — Telephone Encounter (Signed)
Left message for patient to call back  

## 2023-06-23 NOTE — ED Triage Notes (Signed)
Pain swelling in rectal area x 2 days Leaking "popped" Have not peed in 2 days, feels urge unable to urinate

## 2023-06-23 NOTE — ED Notes (Signed)
FC placed without difficulty, immediate return of yellow urine 

## 2023-06-23 NOTE — Telephone Encounter (Signed)
Patient returned call & stated his constipation has resolved. He used cream for the anal fissure irritation & that has resolved as well. He says his buttcheeks are swollen & hard, and has possible abscess that popped d/t pus this morning. Advised him to reach out to PCP office, and he stated he was on the way to be evaluated at ER.

## 2023-06-23 NOTE — ED Provider Notes (Signed)
[place] EMERGENCY DEPARTMENT AT [facility] Provider Note   CSN: [id number] Arrival date & time: 06/23/23  1948     History  No chief complaint on file.   Shawn Brooks is a 47 y.o. male.  47 year old male with past medical history of HIV on Hart therapy with undetectable viral load as well as hyperlipidemia presenting to the emergency department today with rectal pain and pain in his buttocks.  The patient states that he initially went to see his gastroenterologist and was treated with medications for anal fissure.  He states that the symptoms did seem to improve some but got worse over the past few days.  He states that he did notice some spontaneous drainage from his left buttock this morning.  He states the pain goes up into his rectum.  He states that he does have decreased urinary output during this time.  He denies any back pain.  He came to the ER today for further evaluation due to these ongoing symptoms.  He denies any fevers at home but his temperature on arrival is 100.4.        Home Medications Prior to Admission medications   Medication Sig Start Date End Date Taking? Authorizing Provider  albuterol (VENTOLIN HFA) 108 (90 Base) MCG/ACT inhaler Inhale 2 puffs into the lungs every 6 (six) hours as needed for wheezing or shortness of breath. 08/20/19   Marcine Matar, MD  AMBULATORY NON FORMULARY MEDICATION Medication Name: Diltiazem 2% YQM:VHQIONGEX 5% - using your index finger, you should apply a small amount of medication inside the rectum up to your first knuckle/joint three times daily x 6-8 weeks. 06/17/23   Zehr, Shanda Bumps D, PA-C  darunavir (PREZISTA) 800 MG tablet TAKE 1 TABLET(800 MG) BY MOUTH DAILY 03/15/23   Comer, Belia Heman, MD  Eluxadoline (VIBERZI) 100 MG TABS TAKE 1 TABLET BY MOUTH ONE TO TWO TIMES DAILY 03/22/23   Pyrtle, Carie Caddy, MD  elvitegravir-cobicistat-emtricitabine-tenofovir (GENVOYA) 150-150-200-10 MG TABS tablet TAKE 1 TABLET BY MOUTH DAILY  03/15/23   Comer, Belia Heman, MD  FLUoxetine (PROZAC) 20 MG capsule Take 3 capsules (60 mg total) by mouth daily. 06/02/23 08/31/23  Park Pope, MD  HYDROcodone-acetaminophen (NORCO/VICODIN) 5-325 MG tablet Take 1 tablet by mouth every 8 (eight) hours as needed for moderate pain (pain score 4-6). 06/17/23   Zehr, Princella Pellegrini, PA-C  Multiple Vitamin (MULTIVITAMIN) capsule Take 1 capsule by mouth daily. 03/25/23   Park Pope, MD  polyethylene glycol (MIRALAX / GLYCOLAX) 17 g packet Take 17 g by mouth daily.    [provider]  rosuvastatin (CRESTOR) 10 MG tablet Take 1 tablet (10 mg total) by mouth daily. 04/09/23   Marcine Matar, MD  trazodone (DESYREL) 50 MG tablet Take 5 tablets (250 mg total) by mouth at bedtime. 06/02/23 07/02/23  Park Pope, MD  valACYclovir (VALTREX) 500 MG tablet TAKE 1 TABLET BY MOUTH TWICE DAILY Patient taking differently: Take 500 mg by mouth 2 (two) times daily as needed. TAKE 1 TABLET BY MOUTH TWICE DAILY 10/07/22   Marcine Matar, MD  Vitamin D, Ergocalciferol, (DRISDOL) 1.25 MG (50000 UNIT) CAPS capsule TAKE 1 CAPSULE BY MOUTH EVERY 7 DAYS 06/16/23   Marcine Matar, MD      Allergies    Chantix [varenicline] and Oxycodone    Review of Systems   Review of Systems  Gastrointestinal:  Positive for rectal pain.  Skin:  Positive for wound.  All other systems reviewed and are negative.  Physical Exam Updated Vital Signs BP 119/75   Pulse 79   Temp (!) 100.4 F (38 C) (Oral)   Resp 18   SpO2 97%  Physical Exam Vitals and nursing note reviewed.   Gen: NAD Eyes: PERRL, EOMI HEENT: no oropharyngeal swelling Neck: trachea midline Resp: clear to auscultation bilaterally Card: RRR, no murmurs, rubs, or gallops Abd: nontender, nondistended Rectal: There is significant induration noted to the left buttock and the patient is tender along the perirectal region with spontaneous drainage of brown foul-smelling material Extremities: no calf tenderness, no  edema Vascular: 2+ radial pulses bilaterally, 2+ DP pulses bilaterally Skin: no rashes Psyc: acting appropriately   ED Results / Procedures / Treatments   Labs (all labs ordered are listed, but only abnormal results are displayed) Labs Reviewed  COMPREHENSIVE METABOLIC PANEL - Abnormal; Notable for the following components:      Result Value   Potassium 3.3 (*)    Chloride 93 (*)    Glucose, Bld 108 (*)    Creatinine, Ser 1.36 (*)    All other components within normal limits  CBC WITH DIFFERENTIAL/PLATELET - Abnormal; Notable for the following components:   WBC 29.3 (*)    Neutro Abs 25.6 (*)    Monocytes Absolute 1.2 (*)    Abs Immature Granulocytes 0.32 (*)    All other components within normal limits  PROTIME-INR - Abnormal; Notable for the following components:   Prothrombin Time 15.5 (*)    All other components within normal limits  CULTURE, BLOOD (ROUTINE X 2)  CULTURE, BLOOD (ROUTINE X 2)  LACTIC ACID, PLASMA  APTT  LACTIC ACID, PLASMA  URINALYSIS, W/ REFLEX TO CULTURE (INFECTION SUSPECTED)    EKG None  Radiology CT PELVIS W CONTRAST Result Date: 06/23/2023 CLINICAL DATA:  Pain and swelling in the rectal area for 2 days with drainage, initial encounter EXAM: CT PELVIS WITH CONTRAST TECHNIQUE: Multidetector CT imaging of the pelvis was performed using the standard protocol following the bolus administration of intravenous contrast. RADIATION DOSE REDUCTION: This exam was performed according to the departmental dose-optimization program which includes automated exposure control, adjustment of the mA and/or kV according to patient size and/or use of iterative reconstruction technique. CONTRAST:  OMNIPAQUE IOHEXOL 300 MG/ML  SOLN COMPARISON:  None Available. FINDINGS: Urinary Tract: Bladder is well distended. No obstructing abnormality is seen. Bowel: The appendix is within normal limits. Visualized small bowel and colon are within normal limits with the exception of  perianal inflammatory change. Multiple foci of air are noted circumferentially surrounding the anus slightly worse on the right than the left. Minimal fluid is noted as well although no large sizable collection of fluid is seen. This corresponds to the given clinical history of recent rupture and drainage. Considerable inflammatory change in the medial aspect of both buttock is seen but again worse on the right than the left. Vascular/Lymphatic: No pathologically enlarged lymph nodes. No significant vascular abnormality seen. Reproductive:  Prostate is within normal limits. Other:  None. Musculoskeletal: No suspicious bone lesions identified. IMPRESSION: Air and inflammatory change surrounding the anus consistent with the given clinical history of perianal abscess and recent rupture with drainage. No significant residual fluid collection is identified. Electronically Signed   By: Alcide Clever M.D.   On: 06/23/2023 22:37    Procedures Procedures    Medications Ordered in ED Medications  lactated ringers infusion ( Intravenous New Bag/Given 06/23/23 2243)  vancomycin (VANCOCIN) IVPB 1000 mg/200 mL premix (1,000 mg Intravenous New  Bag/Given 06/23/23 2330)    Followed by  vancomycin (VANCOCIN) IVPB 1000 mg/200 mL premix (has no administration in time range)  cefTRIAXone (ROCEPHIN) 2 g in sodium chloride 0.9 % 100 mL IVPB (2 g Intravenous New Bag/Given 06/23/23 2244)  metroNIDAZOLE (FLAGYL) IVPB 500 mg (0 mg Intravenous Stopped 06/23/23 2234)  morphine (PF) 4 MG/ML injection 4 mg (4 mg Intravenous Given 06/23/23 2131)  ondansetron (ZOFRAN) injection 4 mg (4 mg Intravenous Given 06/23/23 2130)  iohexol (OMNIPAQUE) 300 MG/ML solution 100 mL (100 mLs Intravenous Contrast Given 06/23/23 2229)  HYDROmorphone (DILAUDID) injection 1 mg (1 mg Intravenous Given 06/23/23 2332)    ED Course/ Medical Decision Making/ A&P                                 Medical Decision Making 47 year old male with past  medical history of HIV on Hart therapy with undetectable viral load as well as hyperlipidemia presenting to the emergency department today with perirectal/perianal abscess versus fistula.  This is spontaneously draining currently.  The patient is febrile here.  Will cover him empirically with Rocephin and Flagyl.  Will obtain a CT scan of his pelvis with IV contrast to evaluate for the above differential.  Blood cultures were also obtained.  Patient was given morphine and Zofran for his symptoms.  I will reevaluate after imaging for ultimate disposition.  The patient was found to have a significant leukocytosis with a white blood cell count close to 30,000.  He was found to have urinary retention as well and a Foley catheter was placed.  The patient did put out 1 L of urine.  His lactic acid is within normal limits.  There was significant inflammatory changes on the CT scan as well.  Given these findings a call was placed to hospitalist service for IV antibiotics.  Amount and/or Complexity of Data Reviewed Labs: ordered. Radiology: ordered.  Risk Prescription drug management. Decision regarding hospitalization.           Final Clinical Impression(s) / ED Diagnoses Final diagnoses:  Perianal abscess  Sepsis, due to unspecified organism, unspecified whether acute organ dysfunction present [facility])  Urinary retention    Rx / DC Orders ED Discharge Orders     None         Durwin Glaze, MD 06/23/23 2349

## 2023-06-23 NOTE — Progress Notes (Signed)
ED Pharmacy Antibiotic Sign Off An antibiotic consult was received from an ED provider for vancomycin per pharmacy dosing for  perianal abscess . A chart review was completed to assess appropriateness.  A single dose of ceftriaxone and flagyl  placed by the ED provider.   The following one time order(s) were placed per pharmacy consult:  vancomycin 2000 mg x 1 dose  Further antibiotic and/or antibiotic pharmacy consults should be ordered by the admitting provider if indicated.   Thank you for allowing pharmacy to be a part of this patient's care.   Delmar Landau, PharmD, BCPS 06/23/2023 11:04 PM ED Clinical Pharmacist -  [phone]

## 2023-06-23 NOTE — ED Notes (Signed)
Pt has a hx of constipation and has been taking Miralax, now having diarrhea and has stopped taking it.  Pt states he has drank plenty of fluids and has not voided x 2 days.  Pt was seen by GI on 12/20 because of rectal pain, has a hx of anal fissure, at time of visit he did not have a draining abscess.  Area began draining today and pt and friend report it is fecal matter.

## 2023-06-24 ENCOUNTER — Other Ambulatory Visit: Payer: Self-pay

## 2023-06-24 DIAGNOSIS — G4733 Obstructive sleep apnea (adult) (pediatric): Secondary | ICD-10-CM | POA: Diagnosis present

## 2023-06-24 DIAGNOSIS — M549 Dorsalgia, unspecified: Secondary | ICD-10-CM | POA: Diagnosis present

## 2023-06-24 DIAGNOSIS — N179 Acute kidney failure, unspecified: Secondary | ICD-10-CM

## 2023-06-24 DIAGNOSIS — L03317 Cellulitis of buttock: Secondary | ICD-10-CM | POA: Diagnosis present

## 2023-06-24 DIAGNOSIS — G8929 Other chronic pain: Secondary | ICD-10-CM | POA: Diagnosis present

## 2023-06-24 DIAGNOSIS — R338 Other retention of urine: Secondary | ICD-10-CM

## 2023-06-24 DIAGNOSIS — L0231 Cutaneous abscess of buttock: Secondary | ICD-10-CM | POA: Diagnosis present

## 2023-06-24 DIAGNOSIS — F4010 Social phobia, unspecified: Secondary | ICD-10-CM | POA: Diagnosis present

## 2023-06-24 DIAGNOSIS — I10 Essential (primary) hypertension: Secondary | ICD-10-CM | POA: Diagnosis present

## 2023-06-24 DIAGNOSIS — K5900 Constipation, unspecified: Secondary | ICD-10-CM | POA: Diagnosis present

## 2023-06-24 DIAGNOSIS — E876 Hypokalemia: Secondary | ICD-10-CM | POA: Diagnosis present

## 2023-06-24 DIAGNOSIS — E782 Mixed hyperlipidemia: Secondary | ICD-10-CM | POA: Diagnosis present

## 2023-06-24 DIAGNOSIS — E669 Obesity, unspecified: Secondary | ICD-10-CM | POA: Diagnosis present

## 2023-06-24 DIAGNOSIS — K610 Anal abscess: Principal | ICD-10-CM | POA: Diagnosis present

## 2023-06-24 DIAGNOSIS — A630 Anogenital (venereal) warts: Secondary | ICD-10-CM | POA: Diagnosis present

## 2023-06-24 DIAGNOSIS — A419 Sepsis, unspecified organism: Secondary | ICD-10-CM | POA: Diagnosis present

## 2023-06-24 DIAGNOSIS — R7303 Prediabetes: Secondary | ICD-10-CM | POA: Diagnosis present

## 2023-06-24 DIAGNOSIS — K611 Rectal abscess: Secondary | ICD-10-CM | POA: Diagnosis not present

## 2023-06-24 DIAGNOSIS — F17210 Nicotine dependence, cigarettes, uncomplicated: Secondary | ICD-10-CM | POA: Diagnosis present

## 2023-06-24 DIAGNOSIS — E878 Other disorders of electrolyte and fluid balance, not elsewhere classified: Secondary | ICD-10-CM | POA: Diagnosis present

## 2023-06-24 DIAGNOSIS — K612 Anorectal abscess: Secondary | ICD-10-CM | POA: Diagnosis present

## 2023-06-24 DIAGNOSIS — B20 Human immunodeficiency virus [HIV] disease: Secondary | ICD-10-CM | POA: Diagnosis present

## 2023-06-24 DIAGNOSIS — B009 Herpesviral infection, unspecified: Secondary | ICD-10-CM | POA: Diagnosis present

## 2023-06-24 DIAGNOSIS — E66811 Obesity, class 1: Secondary | ICD-10-CM | POA: Diagnosis present

## 2023-06-24 DIAGNOSIS — K219 Gastro-esophageal reflux disease without esophagitis: Secondary | ICD-10-CM | POA: Diagnosis present

## 2023-06-24 DIAGNOSIS — D649 Anemia, unspecified: Secondary | ICD-10-CM | POA: Diagnosis present

## 2023-06-24 DIAGNOSIS — Z720 Tobacco use: Secondary | ICD-10-CM | POA: Diagnosis not present

## 2023-06-24 LAB — BASIC METABOLIC PANEL
Anion gap: 12 (ref 5–15)
BUN: 12 mg/dL (ref 6–20)
CO2: 25 mmol/L (ref 22–32)
Calcium: 8.8 mg/dL — ABNORMAL LOW (ref 8.9–10.3)
Chloride: 97 mmol/L — ABNORMAL LOW (ref 98–111)
Creatinine, Ser: 1.06 mg/dL (ref 0.61–1.24)
GFR, Estimated: >60 mL/min (ref >60)
Glucose, Bld: 88 mg/dL (ref 70–99)
Potassium: 3.3 mmol/L — ABNORMAL LOW (ref 3.5–5.1)
Sodium: 134 mmol/L — ABNORMAL LOW (ref 135–145)

## 2023-06-24 LAB — CBC WITH DIFFERENTIAL/PLATELET
Abs Immature Granulocytes: 0.23 10*3/uL — ABNORMAL HIGH (ref 0.00–0.07)
Basophils Absolute: 0 10*3/uL (ref 0.0–0.1)
Basophils Relative: 0 %
Eosinophils Absolute: 0 10*3/uL (ref 0.0–0.5)
Eosinophils Relative: 0 %
HCT: 36.4 % — ABNORMAL LOW (ref 39.0–52.0)
Hemoglobin: 12.5 g/dL — ABNORMAL LOW (ref 13.0–17.0)
Immature Granulocytes: 1 %
Lymphocytes Relative: 9 %
Lymphs Abs: 2.1 10*3/uL (ref 0.7–4.0)
MCH: 30.1 pg (ref 26.0–34.0)
MCHC: 34.3 g/dL (ref 30.0–36.0)
MCV: 87.7 fL (ref 80.0–100.0)
Monocytes Absolute: 1.2 10*3/uL — ABNORMAL HIGH (ref 0.1–1.0)
Monocytes Relative: 5 %
Neutro Abs: 18.9 10*3/uL — ABNORMAL HIGH (ref 1.7–7.7)
Neutrophils Relative %: 85 %
Platelets: 253 10*3/uL (ref 150–400)
RBC: 4.15 MIL/uL — ABNORMAL LOW (ref 4.22–5.81)
RDW: 13.4 % (ref 11.5–15.5)
WBC: 22.4 10*3/uL — ABNORMAL HIGH (ref 4.0–10.5)
nRBC: 0 % (ref 0.0–0.2)

## 2023-06-24 LAB — URINALYSIS, W/ REFLEX TO CULTURE (INFECTION SUSPECTED)
Bacteria, UA: NONE SEEN
Bilirubin Urine: NEGATIVE
Glucose, UA: NEGATIVE mg/dL
Ketones, ur: 15 mg/dL — AB
Leukocytes,Ua: NEGATIVE
Nitrite: NEGATIVE
Protein, ur: 30 mg/dL — AB
Specific Gravity, Urine: >1.046 — ABNORMAL HIGH (ref 1.005–1.030)
pH: 5.5 (ref 5.0–8.0)

## 2023-06-24 LAB — MAGNESIUM: Magnesium: 2.2 mg/dL (ref 1.7–2.4)

## 2023-06-24 LAB — FOLATE: Folate: 12.6 ng/mL (ref >5.9)

## 2023-06-24 MED ORDER — POTASSIUM CHLORIDE CRYS ER 20 MEQ PO TBCR
40.0000 meq | EXTENDED_RELEASE_TABLET | Freq: Once | ORAL | Status: AC
  Administered 2023-06-24: 40 meq via ORAL
  Filled 2023-06-24: qty 2

## 2023-06-24 MED ORDER — MORPHINE SULFATE (PF) 2 MG/ML IV SOLN
2.0000 mg | INTRAVENOUS | Status: DC | PRN
  Administered 2023-06-24: 2 mg via INTRAVENOUS
  Filled 2023-06-24: qty 1

## 2023-06-24 MED ORDER — ALBUTEROL SULFATE HFA 108 (90 BASE) MCG/ACT IN AERS: 2.0000 | INHALATION_SPRAY | Freq: Four times a day (QID) | RESPIRATORY_TRACT | Status: DC | PRN

## 2023-06-24 MED ORDER — DARUNAVIR 800 MG PO TABS
800.0000 mg | ORAL_TABLET | Freq: Every day | ORAL | Status: DC
  Administered 2023-06-26 – 2023-06-27 (×2): 800 mg via ORAL
  Filled 2023-06-24 (×2): qty 1

## 2023-06-24 MED ORDER — FLUOXETINE HCL 20 MG PO CAPS
60.0000 mg | ORAL_CAPSULE | Freq: Every day | ORAL | Status: DC
  Administered 2023-06-24 – 2023-06-26 (×3): 60 mg via ORAL
  Filled 2023-06-24 (×3): qty 3

## 2023-06-24 MED ORDER — CHLORHEXIDINE GLUCONATE CLOTH 2 % EX PADS
6.0000 | MEDICATED_PAD | Freq: Every day | CUTANEOUS | Status: DC
Start: 2023-06-24 — End: 2023-06-27
  Administered 2023-06-24 – 2023-06-27 (×4): 6 via TOPICAL

## 2023-06-24 MED ORDER — ELVITEG-COBIC-EMTRICIT-TENOFAF 150-150-200-10 MG PO TABS
1.0000 | ORAL_TABLET | Freq: Every day | ORAL | Status: DC
  Administered 2023-06-24 – 2023-06-26 (×3): 1 via ORAL
  Filled 2023-06-24 (×4): qty 1

## 2023-06-24 MED ORDER — HYDROMORPHONE HCL 1 MG/ML IJ SOLN
1.0000 mg | Freq: Once | INTRAMUSCULAR | Status: AC
  Administered 2023-06-24: 1 mg via INTRAVENOUS
  Filled 2023-06-24: qty 1

## 2023-06-24 MED ORDER — DARUNAVIR 800 MG PO TABS
800.0000 mg | ORAL_TABLET | Freq: Every day | ORAL | Status: DC
  Filled 2023-06-24: qty 1

## 2023-06-24 MED ORDER — HYDROMORPHONE HCL 1 MG/ML IJ SOLN
0.5000 mg | INTRAMUSCULAR | Status: DC | PRN
  Administered 2023-06-24 – 2023-06-26 (×7): 1 mg via INTRAVENOUS
  Filled 2023-06-24 (×9): qty 1

## 2023-06-24 MED ORDER — ONDANSETRON HCL 4 MG PO TABS
4.0000 mg | ORAL_TABLET | Freq: Four times a day (QID) | ORAL | Status: DC | PRN
Start: 2023-06-24 — End: 2023-06-27

## 2023-06-24 MED ORDER — HYDROCODONE-ACETAMINOPHEN 5-325 MG PO TABS
1.0000 | ORAL_TABLET | ORAL | Status: DC | PRN
  Administered 2023-06-24: 1 via ORAL
  Administered 2023-06-25 – 2023-06-27 (×10): 2 via ORAL
  Filled 2023-06-24 (×3): qty 2
  Filled 2023-06-24: qty 1
  Filled 2023-06-24 (×9): qty 2

## 2023-06-24 MED ORDER — SODIUM CHLORIDE 0.9 % IV SOLN
2.0000 g | INTRAVENOUS | Status: DC
  Administered 2023-06-24 – 2023-06-26 (×3): 2 g via INTRAVENOUS
  Filled 2023-06-24 (×3): qty 20

## 2023-06-24 MED ORDER — TRAZODONE HCL 50 MG PO TABS: 150.0000 mg | ORAL_TABLET | Freq: Every day | ORAL | Status: DC

## 2023-06-24 MED ORDER — TAMSULOSIN HCL 0.4 MG PO CAPS
0.4000 mg | ORAL_CAPSULE | Freq: Every day | ORAL | Status: DC
  Administered 2023-06-24 – 2023-06-26 (×3): 0.4 mg via ORAL
  Filled 2023-06-24 (×3): qty 1

## 2023-06-24 MED ORDER — BUSPIRONE HCL 5 MG PO TABS
7.5000 mg | ORAL_TABLET | Freq: Every day | ORAL | Status: DC
  Administered 2023-06-24 – 2023-06-26 (×3): 7.5 mg via ORAL
  Filled 2023-06-24 (×3): qty 2

## 2023-06-24 MED ORDER — VANCOMYCIN HCL 1750 MG/350ML IV SOLN
1750.0000 mg | INTRAVENOUS | Status: DC
  Administered 2023-06-24 – 2023-06-26 (×3): 1750 mg via INTRAVENOUS
  Filled 2023-06-24 (×4): qty 350

## 2023-06-24 MED ORDER — METRONIDAZOLE 500 MG/100ML IV SOLN
500.0000 mg | Freq: Two times a day (BID) | INTRAVENOUS | Status: DC
  Administered 2023-06-24 – 2023-06-27 (×7): 500 mg via INTRAVENOUS
  Filled 2023-06-24 (×8): qty 100

## 2023-06-24 MED ORDER — TRAZODONE HCL 50 MG PO TABS
250.0000 mg | ORAL_TABLET | Freq: Every day | ORAL | Status: DC
  Administered 2023-06-24 – 2023-06-26 (×3): 250 mg via ORAL
  Filled 2023-06-24 (×3): qty 1

## 2023-06-24 MED ORDER — POLYETHYLENE GLYCOL 3350 17 G PO PACK
17.0000 g | PACK | Freq: Every day | ORAL | Status: DC
  Administered 2023-06-24: 17 g via ORAL
  Filled 2023-06-24 (×3): qty 1

## 2023-06-24 MED ORDER — ONDANSETRON HCL 4 MG/2ML IJ SOLN
4.0000 mg | Freq: Four times a day (QID) | INTRAMUSCULAR | Status: DC | PRN
  Administered 2023-06-25: 4 mg via INTRAVENOUS
  Filled 2023-06-24: qty 2

## 2023-06-24 MED ORDER — ROSUVASTATIN CALCIUM 5 MG PO TABS
5.0000 mg | ORAL_TABLET | Freq: Every day | ORAL | Status: DC
  Administered 2023-06-25 – 2023-06-26 (×2): 5 mg via ORAL
  Filled 2023-06-24 (×2): qty 1

## 2023-06-24 NOTE — Assessment & Plan Note (Signed)
Smokes 1/2 PPD Declines nicotine patch

## 2023-06-24 NOTE — Assessment & Plan Note (Signed)
-  no urinary output x 2 days -bladder scan showed 900 cc and foley placed on 06/23/23 -check US renal -CT showed normal prostate, significant distended bladder -start flomax -trial voiding in next day or two   Summary: acute urinary retention due to rectal abscess. Once rectal abscess was treated and I&D, pt's foley was removed. Pt able to urinate without foley.

## 2023-06-24 NOTE — Assessment & Plan Note (Addendum)
Continue valacyclovir PRN

## 2023-06-24 NOTE — H&P (Signed)
History and Physical    Patient: Shawn Brooks:[id number] DOB: 03-Feb-1976 DOA: 06/23/2023 DOS: the patient was seen and examined on 06/24/2023 PCP: Marcine Matar, MD  Patient coming from:  [facility]  - lives with his partner. Ambulates independently.    Chief Complaint: rectal abscess/pain   HPI: Shawn Brooks is a 47 y.o. male with medical history significant of HIV, HSV, anxiety, OSA, HLD who presented to ED with complaints of rectal abscess/pain. He states his buttocks has been sore for the past 4-5 days. Yesterday AM he went to the bathroom and was cleaning himself and as he wiped a huge gush came onto his hand. He states it was blood/pus/liquid. States it was not from his anus more on the side. No poop/fecal matter in the discharge. He states it continued to drain for like 30 minutes. He states the pain in his left buttocks was gone and he was able to lay on his side comfortably. He put some tissue in the draining area and went to bed. His partner came home from work and looked at area and then the other side opened up and started to drain. He continued to have pain on the right side of his buttocks so he came to ED. He has not been able to urinate x 2 days. In ER he had 900cc of urine and a foley was placed. He has never had urinary retention before. He also has not had a BM or passed gas since yesterday. He denies any fevers, N/V at home. Denies any abdominal pain.  He has had poor PO intake since Monday/Tuesday this week.   Denies any fever/chills, vision changes/headaches, chest pain or palpitations, shortness of breath or cough, abdominal pain, N/V/D, dysuria or leg swelling. He has had no sexual intercourse in past 1.5 years.   Seen by GI on 06/17/23 for anal fissure and constipation. He was on Viberzi for diarrhea, but stopped this due to constipation. He was given cream for the fissure. Given diltiazem with lidocaine.   He smokes 1/2 PPD and no longer drinks alcohol on a  regular basis.   ER Course:  vitals: temp: 100.4, bp: 117/88, HR; 113, RR: 20, oxygen: 96% on RA Pertinent labs: creatinine: 1.36, potassium: 3.3, WBC: 29.3,  CT pelvis with contrast: Air and inflammatory change surrounding the anus consistent with the given clinical history of perianal abscess and recent rupture with drainage. No significant residual fluid collection is identified. In ED: general surgery consulted. Started on broad spectrum IV abx. BC obtained, IVF. [facility] asked to admit.     Review of Systems: As mentioned in the history of present illness. All other systems reviewed and are negative. Past Medical History:  Diagnosis Date   Anal condyloma    Anxiety    Blood dyscrasia    HIV   Chronic back pain    Constipation    takes miralax daily   Depression    on meds   DJD (degenerative joint disease)    Gastric ulcer    GERD (gastroesophageal reflux disease)    Headache(784.0)    Hiatal hernia    HIV infection (HCC)    on meds   Hyperplastic colon polyp    Hypertension    on meds   Internal hemorrhoids    Past Surgical History:  Procedure Laterality Date   COLONOSCOPY  2019   JMP-MAC-prep good-polyps+   ESOPHAGOGASTRODUODENOSCOPY  03/27/2012   Procedure: ESOPHAGOGASTRODUODENOSCOPY (EGD);  Surgeon: Shirley Friar, MD;  Location: [facility];  Service: Endoscopy;  Laterality: N/A;   IRRIGATION AND DEBRIDEMENT ABSCESS N/A 03/13/2015   Procedure: IRRIGATION AND DEBRIDEMENT ABSCESS;  Surgeon: Luretha Murphy, MD;  Location: [facility];  Service: General;  Laterality: N/A;   WISDOM TOOTH EXTRACTION     Social History:  reports that he has been smoking cigarettes. He started smoking about 34 years ago. He has a 17 pack-year smoking history. He has never used smokeless tobacco. He reports current alcohol use. He reports current drug use. Frequency: 7.00 times per week. Drug: Marijuana.  Allergies  Allergen Reactions   Chantix [Varenicline] Other (See Comments)    Bad  dreams   Oxycodone Itching    Family History  Problem Relation Age of Onset   Esophageal cancer Mother 60       smoker/used ETOH   Pneumonia Father    Diabetes Father    Hypertension Sister    Crohn's disease Brother    Crohn's disease Maternal Grandmother    Cancer Maternal Grandmother        patient unsure of type   Colon cancer Maternal Grandfather     Prior to Admission medications   Medication Sig Start Date End Date Taking? Authorizing Provider  albuterol (VENTOLIN HFA) 108 (90 Base) MCG/ACT inhaler Inhale 2 puffs into the lungs every 6 (six) hours as needed for wheezing or shortness of breath. 08/20/19   Marcine Matar, MD  AMBULATORY NON FORMULARY MEDICATION Medication Name: Diltiazem 2% EXB:[id number] 5% - using your index finger, you should apply a small amount of medication inside the rectum up to your first knuckle/joint three times daily x 6-8 weeks. 06/17/23   Zehr, Shanda Bumps D, PA-C  darunavir (PREZISTA) 800 MG tablet TAKE 1 TABLET(800 MG) BY MOUTH DAILY 03/15/23   Comer, Belia Heman, MD  Eluxadoline (VIBERZI) 100 MG TABS TAKE 1 TABLET BY MOUTH ONE TO TWO TIMES DAILY 03/22/23   Pyrtle, Carie Caddy, MD  elvitegravir-cobicistat-emtricitabine-tenofovir (GENVOYA) 150-150-200-10 MG TABS tablet TAKE 1 TABLET BY MOUTH DAILY 03/15/23   Comer, Belia Heman, MD  FLUoxetine (PROZAC) 20 MG capsule Take 3 capsules (60 mg total) by mouth daily. 06/02/23 08/31/23  Park Pope, MD  HYDROcodone-acetaminophen (NORCO/VICODIN) 5-325 MG tablet Take 1 tablet by mouth every 8 (eight) hours as needed for moderate pain (pain score 4-6). 06/17/23   Zehr, Princella Pellegrini, PA-C  Multiple Vitamin (MULTIVITAMIN) capsule Take 1 capsule by mouth daily. 03/25/23   Park Pope, MD  polyethylene glycol (MIRALAX / GLYCOLAX) 17 g packet Take 17 g by mouth daily.    [provider]  rosuvastatin (CRESTOR) 10 MG tablet Take 1 tablet (10 mg total) by mouth daily. 04/09/23   Marcine Matar, MD  trazodone (DESYREL) 50 MG tablet  Take 5 tablets (250 mg total) by mouth at bedtime. 06/02/23 07/02/23  Park Pope, MD  valACYclovir (VALTREX) 500 MG tablet TAKE 1 TABLET BY MOUTH TWICE DAILY Patient taking differently: Take 500 mg by mouth 2 (two) times daily as needed. TAKE 1 TABLET BY MOUTH TWICE DAILY 10/07/22   Marcine Matar, MD  Vitamin D, Ergocalciferol, (DRISDOL) 1.25 MG (50000 UNIT) CAPS capsule TAKE 1 CAPSULE BY MOUTH EVERY 7 DAYS 06/16/23   Marcine Matar, MD    Physical Exam: Vitals:   06/24/23 0511 06/24/23 0700 06/24/23 0839 06/24/23 1233  BP:  (!) 122/93 124/74 116/64  Pulse:  65 72 76  Resp:   16 17  Temp: 98.4 F (36.9 C)  98.3 F (36.8 C)  98.4 F (36.9 C)  TempSrc: Oral  Oral Oral  SpO2:  95% 98% 97%   General:  Appears calm and comfortable and is in NAD Eyes:  PERRL, EOMI, normal lids, iris ENT:  grossly normal hearing, lips & tongue, mmm; appropriate dentition Neck:  no LAD, masses or thyromegaly; no carotid bruits Cardiovascular:  RRR, no m/r/g. No LE edema.  Respiratory:   CTA bilaterally with no wheezes/rales/rhonchi.  Normal respiratory effort. Abdomen:  soft, NT, ND, NABS Back:   normal alignment, no CVAT Skin:  no rash or induration seen on limited exam GU: bilateral induration and erythema and drainage from buttocks on medial sides with induration to gluteal crease and into scrotum R?>L. TTP with foul smelling, purulent drainage.  Musculoskeletal:  grossly normal tone BUE/BLE, good ROM, no bony abnormality Lower extremity:  No LE edema.  Limited foot exam with no ulcerations.  2+ distal pulses. Psychiatric:  grossly normal mood and affect, speech fluent and appropriate, AOx3 Neurologic:  CN 2-12 grossly intact, moves all extremities in coordinated fashion, sensation intact   Radiological Exams on Admission: Independently reviewed - see discussion in A/P where applicable  CT PELVIS W CONTRAST Result Date: 06/23/2023 CLINICAL DATA:  Pain and swelling in the rectal area for 2 days  with drainage, initial encounter EXAM: CT PELVIS WITH CONTRAST TECHNIQUE: Multidetector CT imaging of the pelvis was performed using the standard protocol following the bolus administration of intravenous contrast. RADIATION DOSE REDUCTION: This exam was performed according to the departmental dose-optimization program which includes automated exposure control, adjustment of the mA and/or kV according to patient size and/or use of iterative reconstruction technique. CONTRAST:  OMNIPAQUE IOHEXOL 300 MG/ML  SOLN COMPARISON:  None Available. FINDINGS: Urinary Tract: Bladder is well distended. No obstructing abnormality is seen. Bowel: The appendix is within normal limits. Visualized small bowel and colon are within normal limits with the exception of perianal inflammatory change. Multiple foci of air are noted circumferentially surrounding the anus slightly worse on the right than the left. Minimal fluid is noted as well although no large sizable collection of fluid is seen. This corresponds to the given clinical history of recent rupture and drainage. Considerable inflammatory change in the medial aspect of both buttock is seen but again worse on the right than the left. Vascular/Lymphatic: No pathologically enlarged lymph nodes. No significant vascular abnormality seen. Reproductive:  Prostate is within normal limits. Other:  None. Musculoskeletal: No suspicious bone lesions identified. IMPRESSION: Air and inflammatory change surrounding the anus consistent with the given clinical history of perianal abscess and recent rupture with drainage. No significant residual fluid collection is identified. Electronically Signed   By: Alcide Clever M.D.   On: 06/23/2023 22:37    EKG: pending    Labs on Admission: I have personally reviewed the available labs and imaging studies at the time of the admission.  Pertinent labs:   creatinine: 1.36, potassium: 3.3,  WBC: 29.3,   Assessment and Plan: Principal  Problem:   Perianal abscess Active Problems:   AKI (acute kidney injury) (HCC)   Acute urinary retention   Herpes simplex virus (HSV) infection   Prediabetes   Human immunodeficiency virus (HIV) disease (HCC)   Social anxiety disorder   Mixed hyperlipidemia   Obstructive sleep apnea   Tobacco abuse    Assessment and Plan: * Perianal abscess 47 year old presenting with bilateral gluteal pain and drainage found to be septic with imaging showing air and inflammatory changes surrounding the anus  consistent with given history of perianal abscess and recent rupture with drainage -admit to med surg  -continue IVF x 24 hours -sepsis physiology resolving. Afebrile, NSR -presented with WBC to 29 -continue broad spectrum abx -BC pending -general surgery consulted. Plan for broad spectrum antibiotics today and NPO at midnight. If worsening symptoms or fever may take to OR tomorrow  -pain medication with dilaudid as needed   AKI (acute kidney injury) (HCC) AKI vs. CKD Last creatinine 3 months ago was 1.35 Did have 900cc and required foley which could be contributing  Will check renal US to r/o post obstructive AKI vs. Abscess contributing  CT pelvis with normal prostate and significantly distended bladder  UA with ketones  Strict I/O Start flomax Trend   Acute urinary retention -no urinary output x 2 days -bladder scan showed 900 cc and foley placed on 06/23/23 -check US renal -CT showed normal prostate, significant distended bladder -start flomax -trial voiding in next day or two   Prediabetes 03/2023: A1C of 5.7 Continue diet and lifestyle modifications   Herpes simplex virus (HSV) infection Continue valacyclovir PRN   Human immunodeficiency virus (HIV) disease (HCC) Followed by ID. Viral load undetectable in 02/2023  Continue genvoya and prezista   Social anxiety disorder Continue prozac and buspar daily   Mixed hyperlipidemia Continue crestor   Obstructive sleep  apnea Sleep study pending   Tobacco abuse Smokes 1/2 PPD Declines nicotine patch     Advance Care Planning:   Code Status: Full Code   Consults: general surgery   DVT Prophylaxis: SCDs  Family Communication: none   Severity of Illness: The appropriate patient status for this patient is INPATIENT. Inpatient status is judged to be reasonable and necessary in order to provide the required intensity of service to ensure the patient's safety. The patient's presenting symptoms, physical exam findings, and initial radiographic and laboratory data in the context of their chronic comorbidities is felt to place them at high risk for further clinical deterioration. Furthermore, it is not anticipated that the patient will be medically stable for discharge from the hospital within 2 midnights of admission.   * I certify that at the point of admission it is my clinical judgment that the patient will require inpatient hospital care spanning beyond 2 midnights from the point of admission due to high intensity of service, high risk for further deterioration and high frequency of surveillance required.*  Author: Orland Mustard, MD 06/24/2023 12:40 PM  For on call review [email or web address].

## 2023-06-24 NOTE — Assessment & Plan Note (Signed)
Continue crestor 

## 2023-06-24 NOTE — Assessment & Plan Note (Signed)
03/2023: A1C of 5.7 Continue diet and lifestyle modifications

## 2023-06-24 NOTE — Assessment & Plan Note (Signed)
Sleep study pending 

## 2023-06-24 NOTE — ED Notes (Signed)
Report given to nurse at [facility], 6th floor, Rm 9.

## 2023-06-24 NOTE — Consult Note (Signed)
Shawn Brooks April 26, 1976  [id number].     HPI:  Shawn Brooks is a 47 yo male who presented to the ED last night with worsening perirectal pain and drainage. He says he started having anal pain about 2 weeks ago. He was seen by his gastroenterologist and treated for an anal fissure. His pain initially improved but then got worse again. A few days ago he started having spontaneous drainage from the area. In the ED he had a fever of 38 with mild tachycardia. A CT scan of the pelvis showed gas locules around the rectum with gluteal cellulitis, consistent with a recently drained abscess. The patient was admitted and started on IV antibiotics. This morning he says the drainage has lessened but he is still having significant gluteal pain.  He has a history of HIV and is on HART therapy. He has never previously had a perirectal abscess. His last colonoscopy was in 2019 and showed a tubular adenoma without dysplasia. He is scheduled for a colonoscopy in January 2025.  ROS: Review of Systems  Constitutional:  Positive for fever.  Respiratory:  Negative for shortness of breath.   Cardiovascular:  Negative for chest pain.  Gastrointestinal:  Negative for abdominal pain, nausea and vomiting.    Family History  Problem Relation Age of Onset   Esophageal cancer Mother 67       smoker/used ETOH   Pneumonia Father    Diabetes Father    Hypertension Sister    Crohn's disease Brother    Crohn's disease Maternal Grandmother    Cancer Maternal Grandmother        patient unsure of type   Colon cancer Maternal Grandfather     Past Medical History:  Diagnosis Date   Anal condyloma    Anxiety    Blood dyscrasia    HIV   Chronic back pain    Constipation    takes miralax daily   Depression    on meds   DJD (degenerative joint disease)    Gastric ulcer    GERD (gastroesophageal reflux disease)    Headache(784.0)    Hiatal hernia    HIV infection (HCC)    on meds   Hyperplastic colon polyp     Hypertension    on meds   Internal hemorrhoids     Past Surgical History:  Procedure Laterality Date   COLONOSCOPY  2019   JMP-MAC-prep good-polyps+   ESOPHAGOGASTRODUODENOSCOPY  03/27/2012   Procedure: ESOPHAGOGASTRODUODENOSCOPY (EGD);  Surgeon: Shirley Friar, MD;  Location: [facility];  Service: Endoscopy;  Laterality: N/A;   IRRIGATION AND DEBRIDEMENT ABSCESS N/A 03/13/2015   Procedure: IRRIGATION AND DEBRIDEMENT ABSCESS;  Surgeon: Luretha Murphy, MD;  Location: [facility];  Service: General;  Laterality: N/A;   WISDOM TOOTH EXTRACTION      Social History:  reports that he has been smoking cigarettes. He started smoking about 34 years ago. He has a 17 pack-year smoking history. He has never used smokeless tobacco. He reports current alcohol use. He reports current drug use. Frequency: 7.00 times per week. Drug: Marijuana.  Allergies:  Allergies  Allergen Reactions   Chantix [Varenicline] Other (See Comments)    Bad dreams   Oxycodone Itching    Medications Prior to Admission  Medication Sig Dispense Refill   albuterol (VENTOLIN HFA) 108 (90 Base) MCG/ACT inhaler Inhale 2 puffs into the lungs every 6 (six) hours as needed for wheezing or shortness of breath. 18 g 1   busPIRone (BUSPAR) 7.5  MG tablet Take 7.5 mg by mouth at bedtime.     darunavir (PREZISTA) 800 MG tablet TAKE 1 TABLET(800 MG) BY MOUTH DAILY 30 tablet 5   elvitegravir-cobicistat-emtricitabine-tenofovir (GENVOYA) 150-150-200-10 MG TABS tablet TAKE 1 TABLET BY MOUTH DAILY 30 tablet 5   FLUoxetine (PROZAC) 20 MG capsule Take 3 capsules (60 mg total) by mouth daily. 90 capsule 2   polyethylene glycol (MIRALAX / GLYCOLAX) 17 g packet Take 17 g by mouth daily.     rosuvastatin (CRESTOR) 5 MG tablet Take 5 mg by mouth daily.     trazodone (DESYREL) 300 MG tablet Take 300 mg by mouth at bedtime.     valACYclovir (VALTREX) 500 MG tablet TAKE 1 TABLET BY MOUTH TWICE DAILY (Patient taking differently: Take 500 mg by  mouth daily as needed (outbreaks).) 14 tablet 5   Vitamin D, Ergocalciferol, (DRISDOL) 1.25 MG (50000 UNIT) CAPS capsule TAKE 1 CAPSULE BY MOUTH EVERY 7 DAYS 12 capsule 1   AMBULATORY NON FORMULARY MEDICATION Medication Name: Diltiazem 2% [id number] 5% - using your index finger, you should apply a small amount of medication inside the rectum up to your first knuckle/joint three times daily x 6-8 weeks. (Patient not taking: Reported on 06/24/2023) 45 g 1   HYDROcodone-acetaminophen (NORCO/VICODIN) 5-325 MG tablet Take 1 tablet by mouth every 8 (eight) hours as needed for moderate pain (pain score 4-6). (Patient not taking: Reported on 06/24/2023) 10 tablet 0   trazodone (DESYREL) 50 MG tablet Take 5 tablets (250 mg total) by mouth at bedtime. (Patient not taking: Reported on 06/24/2023) 150 tablet 0     Physical Exam: Blood pressure 124/74, pulse 72, temperature 98.3 F (36.8 C), temperature source Oral, resp. rate 16, SpO2 98%. General: resting comfortably, appears stated age, no apparent distress Neurological: alert and oriented, no focal deficits HEENT: normocephalic, atraumatic Respiratory: normal work of breathing on room air Anorectal: significant bilateral gluteal erythema and induration. Defect on the right gluteus with spontaneous drainage of purulent fluid. Extremities: warm and well-perfused, no deformities, moving all extremities spontaneously Psychiatric: normal mood and affect     Assessment/Plan 48 yo male with a history of HIV presenting with perianal/gluteal cellulitis and a perirectal abscess. The abscess appears to have mostly drained spontaneously, however he has significant cellulitis with low grade fevers. He should continue on broad-spectrum antibiotics. Ok for a regular diet today. We will re-examine him tomorrow, and if leukocytosis and fevers are not improving, or if exam worsens, he may need operative debridement. Please keep NPO after midnight tonight. I discussed  this plan with the patient.   Sophronia Simas, MD [facility] General, Hepatobiliary and Pancreatic Surgery 06/24/23 11:29 AM

## 2023-06-24 NOTE — Progress Notes (Signed)
Plan of Care Note for accepted transfer   Patient: Shawn Brooks MRN: [id number]   DOA: 06/23/2023  Facility requesting transfer: [facility] Requesting Provider: Dr. Judd Lien Reason for transfer: Perianal abscess and HIV patient with associated sepsis. Facility course: Shawn Brooks is a 47 y.o. male with past medical history of HIV on Hart therapy with undetectable viral load as well as hyperlipidemia presenting to the emergency department today with rectal pain and pain in his buttocks.  The patient states that he initially went to see his gastroenterologist and was treated with medications for anal fissure.  He states that the symptoms did seem to improve some but got worse over the past few days.  He states that he did notice some spontaneous drainage from his left buttock this morning.  He states the pain goes up into his rectum.  He states that he does have decreased urinary output during this time.  He denies any back pain.  He came to the ER today for further evaluation due to these ongoing symptoms.  He denies any fevers at home but his temperature on arrival is 100.4.  Upon presentation to the emergency room, temperature was 100.4/38, heart rate was 113 with otherwise normal vital signs.  Later on BP was 83/52 and came up to 102/73 with hydration.  Labs revealed mild hypokalemia at 3.3 and hypochloremia 93, creatinine 1.36 with otherwise unremarkable CMP.  CBC was remarkable for significant cytosis of 29.3 with neutrophilia of 25.6.  PT was 15.5 with INR 1.2.  UA showed more than 1046 specific gravity with 30 protein 6-10 RBCs and 0-5 WBCs.  Blood cultures were drawn.  Pelvic CT with contrast revealed the following Air and inflammatory change surrounding the anus consistent with the given clinical history of perianal abscess and recent rupture with drainage. No significant residual fluid collection is identified.  Dr. Cliffton Asters with general surgery was notified and is aware about the patient.  The  patient was given IV vancomycin, Rocephin and Flagyl as well as 4 mg of IV morphine sulfate, 4 mg of IV Zofran, 1 mg of IV Dilaudid.  Plan of care: The patient is accepted for admission to Med-surg  unit, at [facility] for further management of perianal cellulitis, abscess with sepsis.  The patient will be under the care of of the EDP until arrival to [facility]  Author: Hannah Beat, MD 06/24/2023  Check [email or web address] for on-call coverage.  Nursing staff, Please call [facility] Admits & Consults System-Wide number on Amion as soon as patient's arrival, so appropriate admitting provider can evaluate the pt.

## 2023-06-24 NOTE — Assessment & Plan Note (Addendum)
Continue prozac and buspar daily

## 2023-06-24 NOTE — Progress Notes (Addendum)
Pharmacy Antibiotic Note  Shawn Brooks is a 47 y.o. male admitted on 06/23/2023 with  perianal abscess .  Pharmacy has been consulted for vanco dosing.  Plan: Vancomycin 2000 mg LD given in ED f/b 1750 mg IV every 24 hours. eAUC 514, adjBW, Vd 0.5 L/kg, scr 1.36 Flagyl 500 mg iv q12h per MD CRO 2 grams iv q24h per MD First doses given in ED PM 12/26 / early AM 12/27 Monitor RF, micro's, and opportunities to de-escalate as more information becomes available  Temp (24hrs), Avg:99.2 F (37.3 C), Min:98.3 F (36.8 C), Max:100.4 F (38 C)  Recent Labs  Lab 06/23/23 2107  WBC 29.3*  CREATININE 1.36*  LATICACIDVEN 1.9    Estimated Creatinine Clearance: 75.7 mL/min (A) (by C-G formula based on SCr of 1.36 mg/dL (H)).    Allergies  Allergen Reactions   Chantix [Varenicline] Other (See Comments)    Bad dreams   Oxycodone Itching    Antimicrobials this admission: CRO 12/26 >>  Flagyl 12/26 >> Vanc 12/26 >>   Microbiology results: 12/26 BCx: ip  Thank you for allowing pharmacy to be a part of this patient's care.   Greta Doom BS, PharmD, BCPS Clinical Pharmacist 06/24/2023 10:14 AM  Contact: [phone] after 3 PM  "Be curious, not judgmental..." -Debbora Dus

## 2023-06-24 NOTE — Assessment & Plan Note (Signed)
47 year old presenting with bilateral gluteal pain and drainage found to be septic with imaging showing air and inflammatory changes surrounding the anus consistent with given history of perianal abscess and recent rupture with drainage -admit to med surg  -continue IVF x 24 hours -sepsis physiology resolving. Afebrile, NSR -presented with WBC to 29 -continue broad spectrum abx -BC pending -general surgery consulted. Plan for broad spectrum antibiotics today and NPO at midnight. If worsening symptoms or fever may take to OR tomorrow  -pain medication with dilaudid as needed   Summary: pt underwent I&D of perirectal abscess on 06-25-2023. Abscess culture have GNR and GPC. Discussed with Dr. Dwain Sarna with surgery. He has cleared pt for DC. Pt feeling better. Leukocytosis improving. Will DC to home with additional 7 days of po augmentin. Pt had completed 4 days of IV rocephin.

## 2023-06-24 NOTE — Assessment & Plan Note (Addendum)
Followed by ID. Viral load undetectable in 02/2023  Continue genvoya and prezista

## 2023-06-24 NOTE — Assessment & Plan Note (Addendum)
AKI vs. CKD Last creatinine 3 months ago was 1.35 Did have 900cc and required foley which could be contributing  Will check renal US to r/o post obstructive AKI vs. Abscess contributing  CT pelvis with normal prostate and significantly distended bladder  UA with ketones  Strict I/O Start flomax Trend   Summary: pt's AKI resolved with treatment of his urinary retention that was due to his rectal abscess.

## 2023-06-25 ENCOUNTER — Inpatient Hospital Stay (HOSPITAL_COMMUNITY): Payer: MEDICAID | Admitting: Certified Registered Nurse Anesthetist

## 2023-06-25 ENCOUNTER — Encounter (HOSPITAL_COMMUNITY)
Admission: EM | Disposition: A | Discharge status: Home / Self Care | Payer: Self-pay | Source: Home / Self Care | Attending: Internal Medicine

## 2023-06-25 ENCOUNTER — Other Ambulatory Visit: Payer: Self-pay

## 2023-06-25 ENCOUNTER — Inpatient Hospital Stay (HOSPITAL_COMMUNITY): Payer: MEDICAID

## 2023-06-25 ENCOUNTER — Encounter (HOSPITAL_COMMUNITY): Payer: Self-pay | Admitting: Family Medicine

## 2023-06-25 DIAGNOSIS — R338 Other retention of urine: Secondary | ICD-10-CM

## 2023-06-25 DIAGNOSIS — B20 Human immunodeficiency virus [HIV] disease: Secondary | ICD-10-CM

## 2023-06-25 DIAGNOSIS — Z720 Tobacco use: Secondary | ICD-10-CM

## 2023-06-25 DIAGNOSIS — K610 Anal abscess: Secondary | ICD-10-CM | POA: Diagnosis not present

## 2023-06-25 DIAGNOSIS — K611 Rectal abscess: Secondary | ICD-10-CM

## 2023-06-25 DIAGNOSIS — N179 Acute kidney failure, unspecified: Secondary | ICD-10-CM | POA: Diagnosis not present

## 2023-06-25 HISTORY — PX: INCISION AND DRAINAGE PERIRECTAL ABSCESS: SHX1804

## 2023-06-25 LAB — CBC
HCT: 35.2 % — ABNORMAL LOW (ref 39.0–52.0)
Hemoglobin: 12.1 g/dL — ABNORMAL LOW (ref 13.0–17.0)
MCH: 30.2 pg (ref 26.0–34.0)
MCHC: 34.4 g/dL (ref 30.0–36.0)
MCV: 87.8 fL (ref 80.0–100.0)
Platelets: 239 10*3/uL (ref 150–400)
RBC: 4.01 MIL/uL — ABNORMAL LOW (ref 4.22–5.81)
RDW: 13.5 % (ref 11.5–15.5)
WBC: 14.5 10*3/uL — ABNORMAL HIGH (ref 4.0–10.5)
nRBC: 0 % (ref 0.0–0.2)

## 2023-06-25 LAB — BASIC METABOLIC PANEL
Anion gap: 8 (ref 5–15)
BUN: 10 mg/dL (ref 6–20)
CO2: 26 mmol/L (ref 22–32)
Calcium: 8.6 mg/dL — ABNORMAL LOW (ref 8.9–10.3)
Chloride: 103 mmol/L (ref 98–111)
Creatinine, Ser: 0.98 mg/dL (ref 0.61–1.24)
GFR, Estimated: >60 mL/min (ref >60)
Glucose, Bld: 99 mg/dL (ref 70–99)
Potassium: 3.7 mmol/L (ref 3.5–5.1)
Sodium: 137 mmol/L (ref 135–145)

## 2023-06-25 SURGERY — INCISION AND DRAINAGE, ABSCESS, PERIRECTAL
Anesthesia: General

## 2023-06-25 MED ORDER — ACETAMINOPHEN 160 MG/5ML PO SOLN
325.0000 mg | Freq: Once | ORAL | Status: DC | PRN
Start: 2023-06-25 — End: 2023-06-25

## 2023-06-25 MED ORDER — PHENYLEPHRINE 80 MCG/ML (10ML) SYRINGE FOR IV PUSH (FOR BLOOD PRESSURE SUPPORT)
PREFILLED_SYRINGE | INTRAVENOUS | Status: AC
  Filled 2023-06-25: qty 10

## 2023-06-25 MED ORDER — EPHEDRINE 5 MG/ML INJ
INTRAVENOUS | Status: AC
  Filled 2023-06-25: qty 5

## 2023-06-25 MED ORDER — MEPERIDINE HCL 25 MG/ML IJ SOLN
6.2500 mg | INTRAMUSCULAR | Status: DC | PRN
Start: 2023-06-25 — End: 2023-06-25

## 2023-06-25 MED ORDER — CHLORHEXIDINE GLUCONATE 0.12 % MT SOLN: 15.0000 mL | Freq: Once | OROMUCOSAL | Status: AC

## 2023-06-25 MED ORDER — HYDROMORPHONE HCL 1 MG/ML IJ SOLN
INTRAMUSCULAR | Status: AC
  Administered 2023-06-25: 0.5 mg via INTRAVENOUS
  Filled 2023-06-25: qty 1

## 2023-06-25 MED ORDER — SUCCINYLCHOLINE CHLORIDE 200 MG/10ML IV SOSY
PREFILLED_SYRINGE | INTRAVENOUS | Status: AC
  Filled 2023-06-25: qty 10

## 2023-06-25 MED ORDER — DEXAMETHASONE SODIUM PHOSPHATE 10 MG/ML IJ SOLN
INTRAMUSCULAR | Status: AC
  Filled 2023-06-25: qty 2

## 2023-06-25 MED ORDER — MIDAZOLAM HCL 2 MG/2ML IJ SOLN
INTRAMUSCULAR | Status: AC
Start: 2023-06-25 — End: ?
  Filled 2023-06-25: qty 2

## 2023-06-25 MED ORDER — LACTATED RINGERS IV SOLN: INTRAVENOUS | Status: DC

## 2023-06-25 MED ORDER — ONDANSETRON HCL 4 MG/2ML IJ SOLN
INTRAMUSCULAR | Status: DC | PRN
  Administered 2023-06-25: 4 mg via INTRAVENOUS

## 2023-06-25 MED ORDER — MIDAZOLAM HCL 2 MG/2ML IJ SOLN
INTRAMUSCULAR | Status: DC | PRN
  Administered 2023-06-25: 2 mg via INTRAVENOUS

## 2023-06-25 MED ORDER — ACETAMINOPHEN 10 MG/ML IV SOLN
INTRAVENOUS | Status: DC | PRN
  Administered 2023-06-25: 1000 mg via INTRAVENOUS

## 2023-06-25 MED ORDER — ACETAMINOPHEN 10 MG/ML IV SOLN
INTRAVENOUS | Status: AC
  Filled 2023-06-25: qty 100

## 2023-06-25 MED ORDER — LIDOCAINE 2% (20 MG/ML) 5 ML SYRINGE
INTRAMUSCULAR | Status: AC
  Filled 2023-06-25: qty 5

## 2023-06-25 MED ORDER — SUCCINYLCHOLINE CHLORIDE 200 MG/10ML IV SOSY
PREFILLED_SYRINGE | INTRAVENOUS | Status: DC | PRN
  Administered 2023-06-25: 120 mg via INTRAVENOUS

## 2023-06-25 MED ORDER — SUGAMMADEX SODIUM 200 MG/2ML IV SOLN
INTRAVENOUS | Status: DC | PRN
  Administered 2023-06-25: 200 mg via INTRAVENOUS

## 2023-06-25 MED ORDER — ACETAMINOPHEN 325 MG PO TABS: 325.0000 mg | ORAL_TABLET | Freq: Once | ORAL | Status: DC | PRN

## 2023-06-25 MED ORDER — PROPOFOL 10 MG/ML IV BOLUS
INTRAVENOUS | Status: DC | PRN
  Administered 2023-06-25: 140 mg via INTRAVENOUS

## 2023-06-25 MED ORDER — HYDROMORPHONE HCL 1 MG/ML IJ SOLN
0.2500 mg | INTRAMUSCULAR | Status: DC | PRN
  Administered 2023-06-25 (×2): 0.5 mg via INTRAVENOUS

## 2023-06-25 MED ORDER — CHLORHEXIDINE GLUCONATE 4 % EX SOLN: 1.0000 | Freq: Once | CUTANEOUS | Status: DC

## 2023-06-25 MED ORDER — KETOROLAC TROMETHAMINE 30 MG/ML IJ SOLN
INTRAMUSCULAR | Status: DC | PRN
  Administered 2023-06-25: 30 mg via INTRAVENOUS

## 2023-06-25 MED ORDER — DEXAMETHASONE SODIUM PHOSPHATE 10 MG/ML IJ SOLN
INTRAMUSCULAR | Status: DC | PRN
  Administered 2023-06-25: 10 mg via INTRAVENOUS

## 2023-06-25 MED ORDER — FENTANYL CITRATE (PF) 250 MCG/5ML IJ SOLN
INTRAMUSCULAR | Status: DC | PRN
  Administered 2023-06-25 (×5): 50 ug via INTRAVENOUS

## 2023-06-25 MED ORDER — PHENYLEPHRINE 80 MCG/ML (10ML) SYRINGE FOR IV PUSH (FOR BLOOD PRESSURE SUPPORT)
PREFILLED_SYRINGE | INTRAVENOUS | Status: DC | PRN
  Administered 2023-06-25 (×2): 80 ug via INTRAVENOUS

## 2023-06-25 MED ORDER — ACETAMINOPHEN 10 MG/ML IV SOLN
1000.0000 mg | Freq: Once | INTRAVENOUS | Status: DC | PRN
Start: 2023-06-25 — End: 2023-06-25

## 2023-06-25 MED ORDER — KETOROLAC TROMETHAMINE 30 MG/ML IJ SOLN
INTRAMUSCULAR | Status: AC
  Filled 2023-06-25: qty 1

## 2023-06-25 MED ORDER — CHLORHEXIDINE GLUCONATE 0.12 % MT SOLN
OROMUCOSAL | Status: AC
  Administered 2023-06-25: 15 mL via OROMUCOSAL
  Filled 2023-06-25: qty 15

## 2023-06-25 MED ORDER — ROCURONIUM BROMIDE 10 MG/ML (PF) SYRINGE
PREFILLED_SYRINGE | INTRAVENOUS | Status: DC | PRN
  Administered 2023-06-25: 40 mg via INTRAVENOUS
  Administered 2023-06-25: 10 mg via INTRAVENOUS

## 2023-06-25 MED ORDER — HYDROMORPHONE HCL 1 MG/ML IJ SOLN
0.5000 mg | INTRAMUSCULAR | Status: AC
  Administered 2023-06-25: 0.5 mg via INTRAVENOUS

## 2023-06-25 MED ORDER — ORAL CARE MOUTH RINSE
15.0000 mL | Freq: Once | OROMUCOSAL | Status: AC
Start: 2023-06-25 — End: 2023-06-25

## 2023-06-25 MED ORDER — LIDOCAINE 2% (20 MG/ML) 5 ML SYRINGE
INTRAMUSCULAR | Status: DC | PRN
  Administered 2023-06-25: 40 mg via INTRAVENOUS

## 2023-06-25 MED ORDER — ONDANSETRON HCL 4 MG/2ML IJ SOLN
INTRAMUSCULAR | Status: AC
  Filled 2023-06-25: qty 2

## 2023-06-25 MED ORDER — FENTANYL CITRATE (PF) 250 MCG/5ML IJ SOLN
INTRAMUSCULAR | Status: AC
  Filled 2023-06-25: qty 5

## 2023-06-25 MED ORDER — ROCURONIUM BROMIDE 10 MG/ML (PF) SYRINGE
PREFILLED_SYRINGE | INTRAVENOUS | Status: AC
  Filled 2023-06-25: qty 10

## 2023-06-25 MED ORDER — DROPERIDOL 2.5 MG/ML IJ SOLN: 0.6250 mg | Freq: Once | INTRAMUSCULAR | Status: DC | PRN

## 2023-06-25 MED ORDER — PROPOFOL 10 MG/ML IV BOLUS
INTRAVENOUS | Status: AC
Start: 2023-06-25 — End: ?
  Filled 2023-06-25: qty 20

## 2023-06-25 MED ORDER — 0.9 % SODIUM CHLORIDE (POUR BTL) OPTIME
TOPICAL | Status: DC | PRN
  Administered 2023-06-25: 1000 mL

## 2023-06-25 SURGICAL SUPPLY — 28 items
BAG COUNTER SPONGE SURGICOUNT (BAG) ×1 IMPLANT
BRIEF MESH DISP LRG (UNDERPADS AND DIAPERS) ×1 IMPLANT
CANISTER SUCT 3000ML PPV (MISCELLANEOUS) ×1 IMPLANT
COVER SURGICAL LIGHT HANDLE (MISCELLANEOUS) ×1 IMPLANT
DRAIN PENROSE 1X12 (DRAIN) IMPLANT
DRAPE HALF SHEET 40X57 (DRAPES) IMPLANT
DRAPE LAPAROTOMY 100X72 PEDS (DRAPES) IMPLANT
ELECT REM PT RETURN 9FT ADLT (ELECTROSURGICAL) ×1 IMPLANT
ELECTRODE REM PT RTRN 9FT ADLT (ELECTROSURGICAL) ×1 IMPLANT
GAUZE PACKING IODOFORM 1/2INX (GAUZE/BANDAGES/DRESSINGS) IMPLANT
GAUZE PACKING IODOFORM 2INX5YD (GAUZE/BANDAGES/DRESSINGS) IMPLANT
GAUZE PAD ABD 8X10 STRL (GAUZE/BANDAGES/DRESSINGS) ×1 IMPLANT
GAUZE SPONGE 4X4 12PLY STRL (GAUZE/BANDAGES/DRESSINGS) ×1 IMPLANT
GLOVE PI ORTHO PRO STRL 7.5 (GLOVE) ×1 IMPLANT
GOWN STRL REUS W/ TWL LRG LVL3 (GOWN DISPOSABLE) ×1 IMPLANT
GOWN STRL REUS W/ TWL XL LVL3 (GOWN DISPOSABLE) ×1 IMPLANT
KIT BASIN OR (CUSTOM PROCEDURE TRAY) ×1 IMPLANT
KIT TURNOVER KIT B (KITS) ×1 IMPLANT
NS IRRIG 1000ML POUR BTL (IV SOLUTION) ×1 IMPLANT
PACK GENERAL/GYN (CUSTOM PROCEDURE TRAY) IMPLANT
PACK LITHOTOMY IV (CUSTOM PROCEDURE TRAY) IMPLANT
PAD ARMBOARD 7.5X6 YLW CONV (MISCELLANEOUS) ×1 IMPLANT
PENCIL SMOKE EVACUATOR (MISCELLANEOUS) ×1 IMPLANT
SWAB COLLECTION DEVICE MRSA (MISCELLANEOUS) IMPLANT
SWAB CULTURE ESWAB REG 1ML (MISCELLANEOUS) IMPLANT
TOWEL GREEN STERILE (TOWEL DISPOSABLE) ×1 IMPLANT
TOWEL GREEN STERILE FF (TOWEL DISPOSABLE) ×1 IMPLANT
UNDERPAD 30X36 HEAVY ABSORB (UNDERPADS AND DIAPERS) ×1 IMPLANT

## 2023-06-25 NOTE — Op Note (Signed)
   Operative Note   Date: 06/25/2023  Procedure: Incision and drainage of perirectal abscess  Pre-op diagnosis: Perirectal abscess Post-op diagnosis: Same  Indication and clinical history: The patient is a 46 y.o. year old male with perirectal abscess and history of HIV     Surgeon: Diamantina Monks, MD  Anesthesiologist: Hart Rochester, MD Anesthesia: General  Findings:  Specimen: Aerobic and anaerobic cultures of perirectal space EBL: 25cc Drains/Implants: Penrose drain and 2 inch iodoform gauze  Disposition: PACU - hemodynamically stable.  Description of procedure: The patient was positioned supine on the operating room table. General anesthetic induction and intubation were uneventful. Foley catheter insertion was performed and was atraumatic. Time-out was performed verifying correct patient, procedure, signature of informed consent, and administration of pre-operative antibiotics. The perineum was prepped and draped in the usual sterile fashion.  There was an obviously draining area of the right gluteus.  Aerobic and anaerobic culture swabs were performed of the overtly purulent drainage.  The wound was explored digitally and the cavity exposed in its entirety.  A counterincision was made and a Penrose drain inserted through this site.  The cavity did extend cephalad into the perirectal space and towards the anus and rectum.  The cavity was irrigated copiously and an entire bottle of 2 inch iodoform used to pack the wound.  Dressings were applied. All sponge and instrument counts were correct at the conclusion of the procedure. The patient was awakened from anesthesia, extubated uneventfully, and transported to the PACU in good condition. There were no complications.   Upon entering the abdomen (organ space), I encountered an abscess in the perirectal space and right gluteus .  CASE DATA:  Type of patient?: DOW CASE (Surgical Hospitalist [facility] Inpatient)  Status of Case? URGENT Add  On  Infection Present At Time Of Surgery (PATOS)?  ABSCESS in the perirectal space and right gluteus    Diamantina Monks, MD General and Trauma Surgery [facility] Surgery

## 2023-06-25 NOTE — Anesthesia Procedure Notes (Signed)
Procedure Name: Intubation Date/Time: 06/25/2023 2:01 PM  Performed by: Garfield Cornea, CRNAPre-anesthesia Checklist: Patient identified, Emergency Drugs available, Suction available and Patient being monitored Patient Re-evaluated:Patient Re-evaluated prior to induction Oxygen Delivery Method: Circle System Utilized Preoxygenation: Pre-oxygenation with 100% oxygen Induction Type: IV induction and Rapid sequence Laryngoscope Size: Mac and 4 Grade View: Grade I Tube type: Oral Tube size: 7.5 mm Number of attempts: 1 Airway Equipment and Method: Stylet Placement Confirmation: ETT inserted through vocal cords under direct vision, positive ETCO2 and breath sounds checked- equal and bilateral Secured at: 22 cm Tube secured with: Tape Dental Injury: Teeth and Oropharynx as per pre-operative assessment

## 2023-06-25 NOTE — Progress Notes (Signed)
Received darunavir (Prezista), pt's home medication from spouse. Medication was not in it's original bottle. It was total of 14 pills. Per pharmacy the medication can still be dispense once it has been verified by the pharmacist. I delivered it to main pharmacy. Serial number [id number]. Home medication receipt in pt's shadow chart. Also, I notified pharmacy that pt has already taken the dose for tonight so it can be dispense tomorrow. Per the pharmacy tech "It is OK because it will take a while to verify the medication."

## 2023-06-25 NOTE — Anesthesia Postprocedure Evaluation (Addendum)
Anesthesia Post Note  Patient: Shawn Brooks  Procedure(s) Performed: IRRIGATION AND DEBRIDEMENT PERIRECTAL ABSCESS     Patient location during evaluation: PACU Anesthesia Type: General Level of consciousness: awake and alert Pain management: pain level controlled Vital Signs Assessment: post-procedure vital signs reviewed and stable Respiratory status: spontaneous breathing, nonlabored ventilation, respiratory function stable and patient connected to nasal cannula oxygen Cardiovascular status: blood pressure returned to baseline and stable Postop Assessment: no apparent nausea or vomiting Anesthetic complications: no  No notable events documented.  Last Vitals:  Vitals:   06/25/23 1540 06/25/23 1545  BP:  (!) 151/88  Pulse: 64 65  Resp: 15 13  Temp:  36.9 C  SpO2: 98% 97%                 Shelton Silvas

## 2023-06-25 NOTE — Progress Notes (Signed)
[facility]                                                                               Shawn Brooks, is a 47 y.o. male, DOB - 1976-05-31, YQI:[id number] Admit date - 06/23/2023    Outpatient Primary MD for the patient is Shawn Matar, MD  LOS - 1  days    Brief summary   Shawn Brooks is a 47 y.o. male with medical history significant of HIV, HSV, anxiety, OSA, HLD who presented to ED with complaints of rectal abscess/pain. He states his buttocks has been sore for the past 4-5 days. On  12/27  he went to the bathroom and was cleaning himself and as he wiped , he had a gush of blood and pus.  He states it continued to drain for like 30 minutes. He states the pain in his left buttocks was gone and he was able to lay on his side comfortably. He put some tissue in the draining area and went to bed. His partner came home from work and looked at area and then the other side opened up and started to drain. He continued to have pain on the right side of his buttocks so he came to ED. He has not been able to urinate x 2 days. Foley was placed in ED.  CT pelvis with contrast: Air and inflammatory change surrounding the anus consistent with the given clinical history of perianal abscess and recent rupture with drainage.  General surgery consulted and he is scheduled for OR later today for incision and drainage.     Assessment & Plan    Assessment and Plan: * Perianal abscess 47 year old presenting with bilateral gluteal pain and drainage found to be septic with imaging showing air and inflammatory changes surrounding the anus consistent with given history of perianal abscess and recent rupture with drainage IV antibiotics to continue for another 48 hours.  Pain control. OR later today for further incision and drainage.  He is afebrile and his leukocytosis is improving.    AKI (acute kidney injury) (HCC) AKI vs. CKD Admitted with a creatinine of 1.3 probably from  urinary retension.  CT pelvis showed significant distended bladder.  Started on flomax.  Checking US renal for evaluation of hydronephrosis.  UA Is negative for infection.   Hypokalemia  Replaced.  Repeat level wnl.   Prediabetes 03/2023: A1C of 5.7 Continue diet and lifestyle modifications   Herpes simplex virus (HSV) infection Continue valacyclovir PRN   Human immunodeficiency virus (HIV) disease (HCC) Followed by ID. Viral load undetectable in 02/2023  Continue genvoya and prezista   Social anxiety disorder Continue prozac and buspar daily   Mixed hyperlipidemia Continue crestor   Obstructive sleep apnea Sleep study pending   Tobacco abuse Smokes 1/2 PPD Declines nicotine patch    Mild normocytic anemia Continue to monitor.    Estimated body mass index is 33.86 kg/m as calculated from the following:   Height as of this encounter: 5\' 8"  (1.727 m).   Weight as of this encounter: 101 kg.  Code Status: full code.  DVT Prophylaxis:  SCD's Start: 06/25/23 1228 SCDs Start:  06/24/23 1204   Level of Care: Level of care: Med-Surg Family Communication:  none at bedside.   Disposition Plan:     Remains inpatient appropriate:  IV antibiotics and OR later today.  Procedures:  OR later today.   Consultants:   General surgery.   Antimicrobials:   Anti-infectives (From admission, onward)    Start     Dose/Rate Route Frequency Ordered Stop   06/25/23 0800  darunavir (PREZISTA) tablet 800 mg        800 mg Oral Daily with breakfast 06/24/23 1258     06/24/23 2200  cefTRIAXone (ROCEPHIN) 2 g in sodium chloride 0.9 % 100 mL IVPB        2 g 200 mL/hr over 30 Minutes Intravenous Every 24 hours 06/24/23 1006     06/24/23 2200  vancomycin (VANCOREADY) IVPB 1750 mg/350 mL        1,750 mg 175 mL/hr over 120 Minutes Intravenous Every 24 hours 06/24/23 1010     06/24/23 1245  elvitegravir-cobicistat-emtricitabine-tenofovir (GENVOYA) 150-150-200-10 MG tablet 1 tablet         1 tablet Oral Daily with breakfast 06/24/23 1129     06/24/23 1245  darunavir (PREZISTA) tablet 800 mg  Status:  Discontinued        800 mg Oral Daily with breakfast 06/24/23 1129 06/24/23 1258   06/24/23 1100  metroNIDAZOLE (FLAGYL) IVPB 500 mg        500 mg 100 mL/hr over 60 Minutes Intravenous Every 12 hours 06/24/23 1006     06/24/23 0004  vancomycin (VANCOCIN) IVPB 1000 mg/200 mL premix       Placed in "Followed by" Linked Group   1,000 mg 200 mL/hr over 60 Minutes Intravenous  Once 06/23/23 2304 06/24/23 0131   06/23/23 2315  vancomycin (VANCOCIN) IVPB 1000 mg/200 mL premix       Placed in "Followed by" Linked Group   1,000 mg 200 mL/hr over 60 Minutes Intravenous  Once 06/23/23 2304 06/24/23 0026   06/23/23 2115  cefTRIAXone (ROCEPHIN) 2 g in sodium chloride 0.9 % 100 mL IVPB        2 g 200 mL/hr over 30 Minutes Intravenous Once 06/23/23 2108 06/23/23 2344   06/23/23 2115  metroNIDAZOLE (FLAGYL) IVPB 500 mg        500 mg 100 mL/hr over 60 Minutes Intravenous  Once 06/23/23 2108 06/23/23 2234        Medications  Scheduled Meds:  busPIRone  7.5 mg Oral QHS   chlorhexidine  1 Application Topical Once   Chlorhexidine Gluconate Cloth  6 each Topical Q0600   darunavir  800 mg Oral Q breakfast   elvitegravir-cobicistat-emtricitabine-tenofovir  1 tablet Oral Q breakfast   FLUoxetine  60 mg Oral Daily   polyethylene glycol  17 g Oral Daily   rosuvastatin  5 mg Oral Daily   tamsulosin  0.4 mg Oral QPC supper   trazodone  250 mg Oral QHS   Continuous Infusions:  cefTRIAXone (ROCEPHIN)  IV 2 g (06/24/23 2115)   metronidazole 500 mg (06/25/23 1114)   vancomycin 1,750 mg (06/24/23 2226)   PRN Meds:.albuterol, HYDROcodone-acetaminophen, HYDROmorphone (DILAUDID) injection, ondansetron **OR** ondansetron (ZOFRAN) IV    Subjective:   Shawn Brooks was seen and examined today. Reports severe pain int he perianal area.   Objective:   Vitals:   06/24/23 2015 06/25/23 0152  06/25/23 0356 06/25/23 0900  BP: 118/82 122/76 111/67 (!) 105/55  Pulse: 73 71 (!) 57 67  Resp:  17  17  Temp: 98.9 F (37.2 C) 98.1 F (36.7 C) 97.9 F (36.6 C) 98.2 F (36.8 C)  TempSrc: Oral Oral Oral Oral  SpO2: 99% 97% 100% 93%  Weight:      Height:        Intake/Output Summary (Last 24 hours) at 06/25/2023 1232 Last data filed at 06/25/2023 0900 Gross per 24 hour  Intake 440 ml  Output 1450 ml  Net -1010 ml   Filed Weights   06/24/23 1336  Weight: 101 kg     Exam General exam: Appears calm and comfortable  Respiratory system: Clear to auscultation. Respiratory effort normal. Cardiovascular system: S1 & S2 heard, RRR. No JVD, Gastrointestinal system: Abdomen is nondistended, soft and nontender.  Central nervous system: Alert and oriented. Extremities: Symmetric 5 x 5 power. Skin: No rashes, Psychiatry:  Mood & affect appropriate.     Data Reviewed:  I have personally reviewed following labs and imaging studies   CBC Lab Results  Component Value Date   WBC 14.5 (H) 06/25/2023   RBC 4.01 (L) 06/25/2023   HGB 12.1 (L) 06/25/2023   HCT 35.2 (L) 06/25/2023   MCV 87.8 06/25/2023   MCH 30.2 06/25/2023   PLT 239 06/25/2023   MCHC 34.4 06/25/2023   RDW 13.5 06/25/2023   LYMPHSABS 2.1 06/24/2023   MONOABS 1.2 (H) 06/24/2023   EOSABS 0.0 06/24/2023   BASOSABS 0.0 06/24/2023     Last metabolic panel Lab Results  Component Value Date   NA 137 06/25/2023   K 3.7 06/25/2023   CL 103 06/25/2023   CO2 26 06/25/2023   BUN 10 06/25/2023   CREATININE 0.98 06/25/2023   GLUCOSE 99 06/25/2023   GFRNONAA >60 06/25/2023   GFRAA 74 07/24/2020   CALCIUM 8.6 (L) 06/25/2023   PHOS 3.0 05/02/2017   PROT 8.0 06/23/2023   ALBUMIN 3.9 06/23/2023   LABGLOB 3.0 03/18/2023   AGRATIO 1.9 06/11/2021   BILITOT 0.7 06/23/2023   ALKPHOS 106 06/23/2023   AST 24 06/23/2023   ALT 13 06/23/2023   ANIONGAP 8 06/25/2023    CBG (last 3)  No results for input(s): "GLUCAP"  in the last 72 hours.    Coagulation Profile: Recent Labs  Lab 06/23/23 2107  INR 1.2     Radiology Studies: US RENAL Result Date: 06/25/2023 CLINICAL DATA:  Urinary retention EXAM: RENAL / URINARY TRACT ULTRASOUND COMPLETE COMPARISON:  CT enterography 08/10/2019 FINDINGS: Right Kidney: Renal measurements: 10.6 x 5.4 x 6.5 cm = volume: 198 mL. Echogenicity within normal limits. No mass or hydronephrosis visualized. Left Kidney: Renal measurements: 12.3 x 6.0 x 6.0 cm = volume: 267 mL. Echogenicity within normal limits. No mass or hydronephrosis visualized. Bladder: No significant abnormality. Foley catheter balloon is noted within the bladder lumen. Other: None. IMPRESSION: No significant sonographic abnormality of the kidneys. Electronically Signed   By: Acquanetta Belling M.D.   On: 06/25/2023 09:35   CT PELVIS W CONTRAST Result Date: 06/23/2023 CLINICAL DATA:  Pain and swelling in the rectal area for 2 days with drainage, initial encounter EXAM: CT PELVIS WITH CONTRAST TECHNIQUE: Multidetector CT imaging of the pelvis was performed using the standard protocol following the bolus administration of intravenous contrast. RADIATION DOSE REDUCTION: This exam was performed according to the departmental dose-optimization program which includes automated exposure control, adjustment of the mA and/or kV according to patient size and/or use of iterative reconstruction technique. CONTRAST:  OMNIPAQUE IOHEXOL 300 MG/ML  SOLN COMPARISON:  None Available.  FINDINGS: Urinary Tract: Bladder is well distended. No obstructing abnormality is seen. Bowel: The appendix is within normal limits. Visualized small bowel and colon are within normal limits with the exception of perianal inflammatory change. Multiple foci of air are noted circumferentially surrounding the anus slightly worse on the right than the left. Minimal fluid is noted as well although no large sizable collection of fluid is seen. This corresponds to  the given clinical history of recent rupture and drainage. Considerable inflammatory change in the medial aspect of both buttock is seen but again worse on the right than the left. Vascular/Lymphatic: No pathologically enlarged lymph nodes. No significant vascular abnormality seen. Reproductive:  Prostate is within normal limits. Other:  None. Musculoskeletal: No suspicious bone lesions identified. IMPRESSION: Air and inflammatory change surrounding the anus consistent with the given clinical history of perianal abscess and recent rupture with drainage. No significant residual fluid collection is identified. Electronically Signed   By: Alcide Clever M.D.   On: 06/23/2023 22:37       Kathlen Mody M.D. [facility] 06/25/2023, 12:32 PM  Available via Epic secure chat 7am-7pm After 7 pm, please refer to night coverage provider listed on amion.

## 2023-06-25 NOTE — Progress Notes (Signed)
Rec'd report from [name], RN in PACU, Pt rec'd back on the floor, checked-in, Shawn Brooks, NT obtained vs. Pt AOX4. Spouse at bedside.

## 2023-06-25 NOTE — Plan of Care (Signed)

## 2023-06-25 NOTE — Progress Notes (Addendum)
Patient seen, discussed plan for OR for I&D. Risks and benefits explained to the patient. Informed consent obtained. All questions answered to his satisfaction. Emergency contact is Dorthula Perfect, contact information verified in the chart.   Diamantina Monks, MD General and Trauma Surgery [facility] Surgery

## 2023-06-25 NOTE — Progress Notes (Signed)
Returned call to pt's spouse, Dorthula Perfect, to give an update on procedure based on the information currently available and answered all questions to best of my knowledge. Reiterated to Mr. Terrell to bring in the pt's home medication, prezista. He stated he would bring it today.

## 2023-06-25 NOTE — Progress Notes (Signed)
       Subjective: Afebrile, WBC down to 15. Patient reports no improvement in perianal pain.   Objective: Vital signs in last 24 hours: Temp:  [97.9 F (36.6 C)-98.9 F (37.2 C)] 98.2 F (36.8 C) (12/28 0900) Pulse Rate:  [57-76] 67 (12/28 0900) Resp:  [17] 17 (12/28 0900) BP: (105-122)/(55-82) 105/55 (12/28 0900) SpO2:  [93 %-100 %] 93 % (12/28 0900) Weight:  [101 kg] 101 kg (12/27 1336) Last BM Date : 06/23/23  Intake/Output from previous day: 12/27 0701 - 12/28 0700 In: 440 [P.O.:440] Out: 1450 [Urine:1450] Intake/Output this shift: No intake/output data recorded.  PE: General: resting comfortably, NAD Neuro: alert and oriented, no focal deficits Resp: normal work of breathing on room air Anorectal: bilateral gluteal cellulitis, improved from yesterday. Ongoing purulent drainage from right gluteal wound with significant tenderness to palpation.   Lab Results:  Recent Labs    06/24/23 1211 06/25/23 0604  WBC 22.4* 14.5*  HGB 12.5* 12.1*  HCT 36.4* 35.2*  PLT 253 239   BMET Recent Labs    06/24/23 1211 06/25/23 0604  NA 134* 137  K 3.3* 3.7  CL 97* 103  CO2 25 26  GLUCOSE 88 99  BUN 12 10  CREATININE 1.06 0.98  CALCIUM 8.8* 8.6*   PT/INR Recent Labs    06/23/23 2107  LABPROT 15.5*  INR 1.2   CMP     Component Value Date/Time   NA 137 06/25/2023 0604   NA 140 03/18/2023 0000   K 3.7 06/25/2023 0604   CL 103 06/25/2023 0604   CO2 26 06/25/2023 0604   GLUCOSE 99 06/25/2023 0604   BUN 10 06/25/2023 0604   BUN 11 03/18/2023 0000   CREATININE 0.98 06/25/2023 0604   CREATININE 1.24 02/12/2022 0906   CALCIUM 8.6 (L) 06/25/2023 0604   PROT 8.0 06/23/2023 2107   PROT 7.6 03/18/2023 0000   ALBUMIN 3.9 06/23/2023 2107   ALBUMIN 4.6 03/18/2023 0000   AST 24 06/23/2023 2107   ALT 13 06/23/2023 2107   ALKPHOS 106 06/23/2023 2107   BILITOT 0.7 06/23/2023 2107   BILITOT <0.2 03/18/2023 0000   GFRNONAA >60 06/25/2023 0604   GFRNONAA 64  07/24/2020 0914   GFRAA 74 07/24/2020 0914   Lipase     Component Value Date/Time   LIPASE 21 10/25/2016 0959     Assessment/Plan 47 yo male with HIV and perianal abscess and gluteal cellulitis. Cellulitis is improving and leukocytosis is improving, however there is ongoing drainage and patient has not had symptomatic improvement. I feel he would benefit from an incision and drainage in the OR to further open the abscess, perform a washout and ensure adequate drainage. Will also collect cultures intra-op. I reviewed the planned procedure with the patient and he agrees to proceed. Posted for surgery today with Dr. Bedelia Person. All questions answered. - Continue broad-spectrum antibiotics - NPO for OR, ok for regular diet postop - Surgery will follow closely     LOS: 1 day    Sophronia Simas, MD [facility] General, Hepatobiliary and Pancreatic Surgery 06/25/23 9:22 AM

## 2023-06-25 NOTE — Transfer of Care (Signed)
Immediate Anesthesia Transfer of Care Note  Patient: Shawn Brooks  Procedure(s) Performed: IRRIGATION AND DEBRIDEMENT PERIRECTAL ABSCESS  Patient Location: PACU  Anesthesia Type:General  Level of Consciousness: awake, alert , and oriented  Airway & Oxygen Therapy: Patient Spontanous Breathing  Post-op Assessment: Report given to RN and Post -op Vital signs reviewed and stable  Post vital signs: Reviewed and stable  Last Vitals:  Vitals Value Taken Time  BP 131/80 06/25/23 1502  Temp    Pulse 104 06/25/23 1503  Resp 12 06/25/23 1503  SpO2 96 % 06/25/23 1503  Vitals shown include unfiled device data.  Last Pain:  Vitals:   06/25/23 1327  TempSrc: Oral  PainSc:          Complications: No notable events documented.

## 2023-06-25 NOTE — Anesthesia Preprocedure Evaluation (Addendum)
Anesthesia Evaluation  Patient identified by MRN, date of birth, ID band Patient awake    Reviewed: Allergy & Precautions, NPO status , Patient's Chart, lab work & pertinent test results  Airway Mallampati: II  TM Distance: >3 FB Neck ROM: Full    Dental  (+) Missing,    Pulmonary sleep apnea , Current Smoker   breath sounds clear to auscultation       Cardiovascular hypertension,  Rhythm:Regular Rate:Normal     Neuro/Psych  Headaches PSYCHIATRIC DISORDERS Anxiety Depression       GI/Hepatic Neg liver ROS, hiatal hernia, PUD,GERD  ,,  Endo/Other  negative endocrine ROS    Renal/GU Renal disease     Musculoskeletal  (+) Arthritis ,    Abdominal   Peds  Hematology  (+) Blood dyscrasia , HIV  Anesthesia Other Findings   Reproductive/Obstetrics                             Anesthesia Physical Anesthesia Plan  ASA: 2  Anesthesia Plan: General   Post-op Pain Management: Ofirmev IV (intra-op)* and Toradol IV (intra-op)*   Induction: Intravenous  PONV Risk Score and Plan: 2 and Ondansetron and Midazolam  Airway Management Planned: Oral ETT  Additional Equipment: None  Intra-op Plan:   Post-operative Plan: Extubation in OR  Informed Consent: I have reviewed the patients History and Physical, chart, labs and discussed the procedure including the risks, benefits and alternatives for the proposed anesthesia with the patient or authorized representative who has indicated his/her understanding and acceptance.     Dental advisory given  Plan Discussed with: CRNA  Anesthesia Plan Comments:        Anesthesia Quick Evaluation

## 2023-06-26 ENCOUNTER — Encounter (HOSPITAL_COMMUNITY): Payer: Self-pay | Admitting: Surgery

## 2023-06-26 DIAGNOSIS — Z720 Tobacco use: Secondary | ICD-10-CM | POA: Diagnosis not present

## 2023-06-26 DIAGNOSIS — N179 Acute kidney failure, unspecified: Secondary | ICD-10-CM | POA: Diagnosis not present

## 2023-06-26 DIAGNOSIS — K610 Anal abscess: Secondary | ICD-10-CM | POA: Diagnosis not present

## 2023-06-26 DIAGNOSIS — R338 Other retention of urine: Secondary | ICD-10-CM | POA: Diagnosis not present

## 2023-06-26 LAB — CBC
HCT: 35.3 % — ABNORMAL LOW (ref 39.0–52.0)
Hemoglobin: 12.1 g/dL — ABNORMAL LOW (ref 13.0–17.0)
MCH: 30 pg (ref 26.0–34.0)
MCHC: 34.3 g/dL (ref 30.0–36.0)
MCV: 87.6 fL (ref 80.0–100.0)
Platelets: 284 10*3/uL (ref 150–400)
RBC: 4.03 MIL/uL — ABNORMAL LOW (ref 4.22–5.81)
RDW: 13.2 % (ref 11.5–15.5)
WBC: 18.3 10*3/uL — ABNORMAL HIGH (ref 4.0–10.5)
nRBC: 0 % (ref 0.0–0.2)

## 2023-06-26 NOTE — Progress Notes (Signed)
[facility]                                                                               Shawn Brooks, is a 47 y.o. male, DOB - Mar 03, 1976, VWU:[id number] Admit date - 06/23/2023    Outpatient Primary MD for the patient is Shawn Matar, MD  LOS - 2  days    Brief summary   Shawn Brooks is a 47 y.o. male with medical history significant of HIV, HSV, anxiety, OSA, HLD who presented to ED with complaints of rectal abscess/pain. He states his buttocks has been sore for the past 4-5 days. On  12/27  he went to the bathroom and was cleaning himself and as he wiped , he had a gush of blood and pus.  He states it continued to drain for like 30 minutes. He states the pain in his left buttocks was gone and he was able to lay on his side comfortably. He put some tissue in the draining area and went to bed. His partner came home from work and looked at area and then the other side opened up and started to drain. He continued to have pain on the right side of his buttocks so he came to ED. He has not been able to urinate x 2 days. Foley was placed in ED.  CT pelvis with contrast: Air and inflammatory change surrounding the anus consistent with the given clinical history of perianal abscess and recent rupture with drainage.  General surgery consulted and he underwent for incision and drainage of the perianal abscess .     Assessment & Plan    Assessment and Plan: * Perianal abscess with gluteal abscess 46 year old presenting with bilateral gluteal pain and drainage found to be septic with imaging showing air and inflammatory changes surrounding the anus consistent with given history of perianal abscess and recent rupture with drainage S/p I&D of the perianal abscess by general surgery on 12/28 .  Cultures are pending.  Continue with IV antibiotics for another 24 hours and transition to oral on discharge.  Pain better controlled today, but still required IV pain meds.    AKI  (acute kidney injury) (HCC) AKI vs. CKD Admitted with a creatinine of 1.3 probably from urinary retension.  CT pelvis showed significant distended bladder.  Started on flomax.  Checking US renal is negative for hydronephrosis.  UA Is negative for infection.   D/c foley today.   Hypokalemia  Replaced.  Repeat level wnl.   Prediabetes 03/2023: A1C of 5.7 Continue diet and lifestyle modifications   Herpes simplex virus (HSV) infection Continue valacyclovir PRN   Human immunodeficiency virus (HIV) disease (HCC) Followed by ID. Viral load undetectable in 02/2023  Continue genvoya and prezista   Social anxiety disorder Continue prozac and buspar daily   Mixed hyperlipidemia Continue crestor   Obstructive sleep apnea Sleep study pending   Tobacco abuse Smokes 1/2 PPD Declines nicotine patch    Mild normocytic anemia Continue to monitor.    Estimated body mass index is 33.86 kg/m as calculated from the following:   Height as of this encounter: 5\' 8"  (1.727 m).  Weight as of this encounter: 101 kg.  Code Status: full code.  DVT Prophylaxis:  SCDs Start: 06/24/23 1204   Level of Care: Level of care: Med-Surg Family Communication:  none at bedside.   Disposition Plan:     Remains inpatient appropriate:  IV antibiotics.  Procedures:  I&D on 12/28.   Consultants:   General surgery.   Antimicrobials:   Anti-infectives (From admission, onward)    Start     Dose/Rate Route Frequency Ordered Stop   06/25/23 0800  darunavir (PREZISTA) tablet 800 mg        800 mg Oral Daily with breakfast 06/24/23 1258     06/24/23 2200  cefTRIAXone (ROCEPHIN) 2 g in sodium chloride 0.9 % 100 mL IVPB        2 g 200 mL/hr over 30 Minutes Intravenous Every 24 hours 06/24/23 1006     06/24/23 2200  vancomycin (VANCOREADY) IVPB 1750 mg/350 mL        1,750 mg 175 mL/hr over 120 Minutes Intravenous Every 24 hours 06/24/23 1010     06/24/23 1245   elvitegravir-cobicistat-emtricitabine-tenofovir (GENVOYA) 150-150-200-10 MG tablet 1 tablet        1 tablet Oral Daily with breakfast 06/24/23 1129     06/24/23 1245  darunavir (PREZISTA) tablet 800 mg  Status:  Discontinued        800 mg Oral Daily with breakfast 06/24/23 1129 06/24/23 1258   06/24/23 1100  metroNIDAZOLE (FLAGYL) IVPB 500 mg        500 mg 100 mL/hr over 60 Minutes Intravenous Every 12 hours 06/24/23 1006     06/24/23 0004  vancomycin (VANCOCIN) IVPB 1000 mg/200 mL premix       Placed in "Followed by" Linked Group   1,000 mg 200 mL/hr over 60 Minutes Intravenous  Once 06/23/23 2304 06/24/23 0131   06/23/23 2315  vancomycin (VANCOCIN) IVPB 1000 mg/200 mL premix       Placed in "Followed by" Linked Group   1,000 mg 200 mL/hr over 60 Minutes Intravenous  Once 06/23/23 2304 06/24/23 0026   06/23/23 2115  cefTRIAXone (ROCEPHIN) 2 g in sodium chloride 0.9 % 100 mL IVPB        2 g 200 mL/hr over 30 Minutes Intravenous Once 06/23/23 2108 06/23/23 2344   06/23/23 2115  metroNIDAZOLE (FLAGYL) IVPB 500 mg        500 mg 100 mL/hr over 60 Minutes Intravenous  Once 06/23/23 2108 06/23/23 2234        Medications  Scheduled Meds:  busPIRone  7.5 mg Oral QHS   Chlorhexidine Gluconate Cloth  6 each Topical Q0600   darunavir  800 mg Oral Q breakfast   elvitegravir-cobicistat-emtricitabine-tenofovir  1 tablet Oral Q breakfast   FLUoxetine  60 mg Oral Daily   polyethylene glycol  17 g Oral Daily   rosuvastatin  5 mg Oral Daily   tamsulosin  0.4 mg Oral QPC supper   trazodone  250 mg Oral QHS   Continuous Infusions:  cefTRIAXone (ROCEPHIN)  IV 2 g (06/25/23 2045)   metronidazole 100 mL/hr at 06/26/23 1027   vancomycin 1,750 mg (06/25/23 2133)   PRN Meds:.albuterol, HYDROcodone-acetaminophen, HYDROmorphone (DILAUDID) injection, ondansetron **OR** ondansetron (ZOFRAN) IV    Subjective:   Shawn Brooks was seen and examined today. Pain is better controlled.   Objective:    Vitals:   06/25/23 1627 06/25/23 1900 06/26/23 0359 06/26/23 0846  BP: (!) 141/82 100/60 132/81 113/68  Pulse: (!) 58 65 (!)  55 70  Resp: 17 16 18 17   Temp: 98.2 F (36.8 C) 98.4 F (36.9 C) 98.7 F (37.1 C) (!) 97.5 F (36.4 C)  TempSrc: Oral Oral Oral Oral  SpO2: 99% 100% 97%   Weight:      Height:        Intake/Output Summary (Last 24 hours) at 06/26/2023 1412 Last data filed at 06/26/2023 1027 Gross per 24 hour  Intake 1676.01 ml  Output 1550 ml  Net 126.01 ml   Filed Weights   06/24/23 1336 06/25/23 1320  Weight: 101 kg 101 kg     Exam General exam: Appears calm and comfortable  Respiratory system: Clear to auscultation. Respiratory effort normal. Cardiovascular system: S1 & S2 heard, RRR. No JVD, Gastrointestinal system: Abdomen is nondistended, soft and nontender.  Central nervous system: Alert and oriented.  Extremities: Symmetric 5 x 5 power. Skin:  improving gluteal cellulitis.  Psychiatry:. Mood & affect appropriate.    Data Reviewed:  I have personally reviewed following labs and imaging studies   CBC Lab Results  Component Value Date   WBC 18.3 (H) 06/26/2023   RBC 4.03 (L) 06/26/2023   HGB 12.1 (L) 06/26/2023   HCT 35.3 (L) 06/26/2023   MCV 87.6 06/26/2023   MCH 30.0 06/26/2023   PLT 284 06/26/2023   MCHC 34.3 06/26/2023   RDW 13.2 06/26/2023   LYMPHSABS 2.1 06/24/2023   MONOABS 1.2 (H) 06/24/2023   EOSABS 0.0 06/24/2023   BASOSABS 0.0 06/24/2023     Last metabolic panel Lab Results  Component Value Date   NA 137 06/25/2023   K 3.7 06/25/2023   CL 103 06/25/2023   CO2 26 06/25/2023   BUN 10 06/25/2023   CREATININE 0.98 06/25/2023   GLUCOSE 99 06/25/2023   GFRNONAA >60 06/25/2023   GFRAA 74 07/24/2020   CALCIUM 8.6 (L) 06/25/2023   PHOS 3.0 05/02/2017   PROT 8.0 06/23/2023   ALBUMIN 3.9 06/23/2023   LABGLOB 3.0 03/18/2023   AGRATIO 1.9 06/11/2021   BILITOT 0.7 06/23/2023   ALKPHOS 106 06/23/2023   AST 24 06/23/2023    ALT 13 06/23/2023   ANIONGAP 8 06/25/2023    CBG (last 3)  No results for input(s): "GLUCAP" in the last 72 hours.    Coagulation Profile: Recent Labs  Lab 06/23/23 2107  INR 1.2     Radiology Studies: US RENAL Result Date: 06/25/2023 CLINICAL DATA:  Urinary retention EXAM: RENAL / URINARY TRACT ULTRASOUND COMPLETE COMPARISON:  CT enterography 08/10/2019 FINDINGS: Right Kidney: Renal measurements: 10.6 x 5.4 x 6.5 cm = volume: 198 mL. Echogenicity within normal limits. No mass or hydronephrosis visualized. Left Kidney: Renal measurements: 12.3 x 6.0 x 6.0 cm = volume: 267 mL. Echogenicity within normal limits. No mass or hydronephrosis visualized. Bladder: No significant abnormality. Foley catheter balloon is noted within the bladder lumen. Other: None. IMPRESSION: No significant sonographic abnormality of the kidneys. Electronically Signed   By: Acquanetta Belling M.D.   On: 06/25/2023 09:35       Kathlen Mody M.D. [facility] 06/26/2023, 2:12 PM  Available via Epic secure chat 7am-7pm After 7 pm, please refer to night coverage provider listed on amion.

## 2023-06-26 NOTE — Plan of Care (Signed)

## 2023-06-26 NOTE — Progress Notes (Signed)
° ° °  1 Day Post-Op  Subjective: Feels that pain is better after incision and drainage yesterday. Afebrile. CBC pending.    Objective: Vital signs in last 24 hours: Temp:  [98.2 F (36.8 C)-99 F (37.2 C)] 98.7 F (37.1 C) (12/29 0359) Pulse Rate:  [55-107] 55 (12/29 0359) Resp:  [12-18] 18 (12/29 0359) BP: (100-151)/(55-88) 132/81 (12/29 0359) SpO2:  [93 %-100 %] 97 % (12/29 0359) Weight:  [101 kg] 101 kg (12/28 1320) Last BM Date : 06/23/23  Intake/Output from previous day: 12/28 0701 - 12/29 0700 In: 1640.1 [P.O.:240; I.V.:100; IV Piggyback:1300.1] Out: 1950 [Urine:1925; Blood:25] Intake/Output this shift: No intake/output data recorded.  PE: General: resting comfortably, NAD Neuro: alert and oriented, no focal deficits Resp: normal work of breathing on room air Anorectal: improving gluteal cellulitis. Penrose drain in place with serosanguinous drainage. Iodoform packing removed.   Lab Results:  Recent Labs    06/24/23 1211 06/25/23 0604  WBC 22.4* 14.5*  HGB 12.5* 12.1*  HCT 36.4* 35.2*  PLT 253 239   BMET Recent Labs    06/24/23 1211 06/25/23 0604  NA 134* 137  K 3.3* 3.7  CL 97* 103  CO2 25 26  GLUCOSE 88 99  BUN 12 10  CREATININE 1.06 0.98  CALCIUM 8.8* 8.6*   PT/INR Recent Labs    06/23/23 2107  LABPROT 15.5*  INR 1.2   CMP     Component Value Date/Time   NA 137 06/25/2023 0604   NA 140 03/18/2023 0000   K 3.7 06/25/2023 0604   CL 103 06/25/2023 0604   CO2 26 06/25/2023 0604   GLUCOSE 99 06/25/2023 0604   BUN 10 06/25/2023 0604   BUN 11 03/18/2023 0000   CREATININE 0.98 06/25/2023 0604   CREATININE 1.24 02/12/2022 0906   CALCIUM 8.6 (L) 06/25/2023 0604   PROT 8.0 06/23/2023 2107   PROT 7.6 03/18/2023 0000   ALBUMIN 3.9 06/23/2023 2107   ALBUMIN 4.6 03/18/2023 0000   AST 24 06/23/2023 2107   ALT 13 06/23/2023 2107   ALKPHOS 106 06/23/2023 2107   BILITOT 0.7 06/23/2023 2107   BILITOT <0.2 03/18/2023 0000   GFRNONAA >60  06/25/2023 0604   GFRNONAA 64 07/24/2020 0914   GFRAA 74 07/24/2020 0914   Lipase     Component Value Date/Time   LIPASE 21 10/25/2016 0959     Assessment/Plan 47 yo male with HIV and perianal/perirectal abscess and gluteal cellulitis, now POD1 s/p incision and drainage on 12/28 by Dr. Bedelia Person. - Wound packing removed today, penrose will remain in place, to be removed in the office in 7-10 days. - Intra-op cultures with GPCs and GNRs, speciation pending. Continue broad-spectrum antibiotics. CBC pending. - Urinary retention: foley placed 2 days ago, on flomax. Remove foley today for voiding trial, anticipate patient will be able to void now that abscess has been adequately drained.    LOS: 2 days    Sophronia Simas, MD [facility] Surgery General, Hepatobiliary and Pancreatic Surgery 06/26/23 8:36 AM

## 2023-06-27 DIAGNOSIS — G4733 Obstructive sleep apnea (adult) (pediatric): Secondary | ICD-10-CM | POA: Diagnosis not present

## 2023-06-27 DIAGNOSIS — E66811 Obesity, class 1: Secondary | ICD-10-CM

## 2023-06-27 DIAGNOSIS — N179 Acute kidney failure, unspecified: Secondary | ICD-10-CM | POA: Diagnosis not present

## 2023-06-27 DIAGNOSIS — K610 Anal abscess: Secondary | ICD-10-CM | POA: Diagnosis not present

## 2023-06-27 DIAGNOSIS — R338 Other retention of urine: Secondary | ICD-10-CM | POA: Diagnosis not present

## 2023-06-27 LAB — CBC
HCT: 33.6 % — ABNORMAL LOW (ref 39.0–52.0)
Hemoglobin: 11.4 g/dL — ABNORMAL LOW (ref 13.0–17.0)
MCH: 29.8 pg (ref 26.0–34.0)
MCHC: 33.9 g/dL (ref 30.0–36.0)
MCV: 87.7 fL (ref 80.0–100.0)
Platelets: 278 10*3/uL (ref 150–400)
RBC: 3.83 MIL/uL — ABNORMAL LOW (ref 4.22–5.81)
RDW: 13.2 % (ref 11.5–15.5)
WBC: 17.5 10*3/uL — ABNORMAL HIGH (ref 4.0–10.5)
nRBC: 0.1 % (ref 0.0–0.2)

## 2023-06-27 LAB — BASIC METABOLIC PANEL
Anion gap: 8 (ref 5–15)
BUN: 13 mg/dL (ref 6–20)
CO2: 25 mmol/L (ref 22–32)
Calcium: 8.2 mg/dL — ABNORMAL LOW (ref 8.9–10.3)
Chloride: 104 mmol/L (ref 98–111)
Creatinine, Ser: 0.97 mg/dL (ref 0.61–1.24)
GFR, Estimated: >60 mL/min (ref >60)
Glucose, Bld: 98 mg/dL (ref 70–99)
Potassium: 3.7 mmol/L (ref 3.5–5.1)
Sodium: 137 mmol/L (ref 135–145)

## 2023-06-27 MED ORDER — HYDROCODONE-ACETAMINOPHEN 5-325 MG PO TABS: 1.0000 | ORAL_TABLET | Freq: Four times a day (QID) | ORAL | 0 refills | Status: AC | PRN

## 2023-06-27 MED ORDER — DOCUSATE SODIUM 100 MG PO CAPS: 200.0000 mg | ORAL_CAPSULE | Freq: Two times a day (BID) | ORAL | Status: AC

## 2023-06-27 MED ORDER — POLYETHYLENE GLYCOL 3350 17 G PO PACK: 34.0000 g | PACK | Freq: Every day | ORAL | Status: DC

## 2023-06-27 MED ORDER — AMOXICILLIN-POT CLAVULANATE 875-125 MG PO TABS: 1.0000 | ORAL_TABLET | Freq: Two times a day (BID) | ORAL | 0 refills | Status: AC

## 2023-06-27 MED ORDER — TAMSULOSIN HCL 0.4 MG PO CAPS: 0.4000 mg | ORAL_CAPSULE | Freq: Every day | ORAL | 0 refills | Status: AC

## 2023-06-27 MED ORDER — DOCUSATE SODIUM 100 MG PO CAPS
200.0000 mg | ORAL_CAPSULE | Freq: Two times a day (BID) | ORAL | Status: DC
Start: 2023-06-27 — End: 2023-06-27

## 2023-06-27 NOTE — Subjective & Objective (Addendum)
Pt seen and examined. Messaged with Dr. Dwain Sarna with general surgery. He has cleared pt for discharge today.

## 2023-06-27 NOTE — Progress Notes (Signed)
Pharmacy Antibiotic Note  Shawn Brooks is a 47 y.o. male admitted on 06/23/2023 with  perianal abscess .  Pharmacy has been consulted for vanco dosing.  Plan: Vancomycin 1750mg  q24h, Scr: 0.97, Vd: 0.5 (BMI: 33.8), utilizing IBW, eAUC: 442.  Consider levels 12/31 if patients remains inpatient, potential discharge today based on [facility] documentation on 12/29.  Flagyl 500 mg iv q12h per MD CRO 2 grams iv q24h per MD F/u species within wound cx.   Temp (24hrs), Avg:98.2 F (36.8 C), Min:97.8 F (36.6 C), Max:99.1 F (37.3 C)  Recent Labs  Lab 06/23/23 2107 06/24/23 1211 06/25/23 0604 06/26/23 0852 06/27/23 0559  WBC 29.3* 22.4* 14.5* 18.3* 17.5*  CREATININE 1.36* 1.06 0.98  --  0.97  LATICACIDVEN 1.9  --   --   --   --     Estimated Creatinine Clearance: 106.1 mL/min (by C-G formula based on SCr of 0.97 mg/dL).    Allergies  Allergen Reactions   Chantix [Varenicline] Other (See Comments)    Bad dreams   Oxycodone Itching    Antimicrobials this admission: CRO 12/26 >>  Flagyl 12/26 >> Vanc 12/26 >>   Microbiology results: 12/26 BCx: NGTD x2d 12/28 abscess cx: GPC, GNR,GPR   Thank you for allowing pharmacy to be a part of this patient's care.   Estill Batten, PharmD, BCCCP  Clinical Pharmacist 06/27/2023 10:08 AM  Contact: [phone] after 3 PM

## 2023-06-27 NOTE — Assessment & Plan Note (Signed)
BMI 33.86

## 2023-06-27 NOTE — Hospital Course (Signed)
HPI: Shawn Brooks is a 47 y.o. male with medical history significant of HIV, HSV, anxiety, OSA, HLD who presented to ED with complaints of rectal abscess/pain. He states his buttocks has been sore for the past 4-5 days. Yesterday AM he went to the bathroom and was cleaning himself and as he wiped a huge gush came onto his hand. He states it was blood/pus/liquid. States it was not from his anus more on the side. No poop/fecal matter in the discharge. He states it continued to drain for like 30 minutes. He states the pain in his left buttocks was gone and he was able to lay on his side comfortably. He put some tissue in the draining area and went to bed. His partner came home from work and looked at area and then the other side opened up and started to drain. He continued to have pain on the right side of his buttocks so he came to ED. He has not been able to urinate x 2 days. In ER he had 900cc of urine and a foley was placed. He has never had urinary retention before. He also has not had a BM or passed gas since yesterday. He denies any fevers, N/V at home. Denies any abdominal pain.  He has had poor PO intake since Monday/Tuesday this week.    Denies any fever/chills, vision changes/headaches, chest pain or palpitations, shortness of breath or cough, abdominal pain, N/V/D, dysuria or leg swelling. He has had no sexual intercourse in past 1.5 years.    Seen by GI on 06/17/23 for anal fissure and constipation. He was on Viberzi for diarrhea, but stopped this due to constipation. He was given cream for the fissure. Given diltiazem with lidocaine.    He smokes 1/2 PPD and no longer drinks alcohol on a regular basis.    ER Course:  vitals: temp: 100.4, bp: 117/88, HR; 113, RR: 20, oxygen: 96% on RA Pertinent labs: creatinine: 1.36, potassium: 3.3, WBC: 29.3,  CT pelvis with contrast: Air and inflammatory change surrounding the anus consistent with the given clinical history of perianal abscess and recent  rupture with drainage. No significant residual fluid collection is identified. In ED: general surgery consulted. Started on broad spectrum IV abx. BC obtained, IVF. TRH asked to admit.   Significant Events: Admitted 06/23/2023 rectal/peri-rectal abscess    Antibiotic Therapy: Anti-infectives (From admission, onward)    Start     Dose/Rate Route Frequency Ordered Stop   06/25/23 0800  darunavir (PREZISTA) tablet 800 mg        800 mg Oral Daily with breakfast 06/24/23 1258     06/24/23 2200  cefTRIAXone (ROCEPHIN) 2 g in sodium chloride 0.9 % 100 mL IVPB        2 g 200 mL/hr over 30 Minutes Intravenous Every 24 hours 06/24/23 1006     06/24/23 2200  vancomycin (VANCOREADY) IVPB 1750 mg/350 mL        1,750 mg 175 mL/hr over 120 Minutes Intravenous Every 24 hours 06/24/23 1010     06/24/23 1245  elvitegravir-cobicistat-emtricitabine-tenofovir (GENVOYA) 150-150-200-10 MG tablet 1 tablet        1 tablet Oral Daily with breakfast 06/24/23 1129     06/24/23 1245  darunavir (PREZISTA) tablet 800 mg  Status:  Discontinued        800 mg Oral Daily with breakfast 06/24/23 1129 06/24/23 1258   06/24/23 1100  metroNIDAZOLE (FLAGYL) IVPB 500 mg        500 mg  100 mL/hr over 60 Minutes Intravenous Every 12 hours 06/24/23 1006     06/24/23 0004  vancomycin (VANCOCIN) IVPB 1000 mg/200 mL premix       Placed in "Followed by" Linked Group   1,000 mg 200 mL/hr over 60 Minutes Intravenous  Once 06/23/23 2304 06/24/23 0131   06/23/23 2315  vancomycin (VANCOCIN) IVPB 1000 mg/200 mL premix       Placed in "Followed by" Linked Group   1,000 mg 200 mL/hr over 60 Minutes Intravenous  Once 06/23/23 2304 06/24/23 0026   06/23/23 2115  cefTRIAXone (ROCEPHIN) 2 g in sodium chloride 0.9 % 100 mL IVPB        2 g 200 mL/hr over 30 Minutes Intravenous Once 06/23/23 2108 06/23/23 2344   06/23/23 2115  metroNIDAZOLE (FLAGYL) IVPB 500 mg        500 mg 100 mL/hr over 60 Minutes Intravenous  Once 06/23/23 2108  06/23/23 2234       Procedures: I&D of perirectal abscess  Consultants: General surgery

## 2023-06-27 NOTE — Progress Notes (Signed)
2 Days Post-Op   Subjective/Chief Complaint: Pain much better no complaints   Objective: Vital signs in last 24 hours: Temp:  [97.8 F (36.6 C)-99.1 F (37.3 C)] 98.1 F (36.7 C) (12/30 0845) Pulse Rate:  [67-71] 71 (12/30 0845) Resp:  [16-18] 18 (12/30 0845) BP: (100-114)/(63-74) 114/67 (12/30 0845) SpO2:  [97 %-99 %] 97 % (12/30 0845) Last BM Date : 06/23/23  Intake/Output from previous day: 12/29 0701 - 12/30 0700 In: 1130 [P.O.:480; IV Piggyback:650] Out: 850 [Urine:850] Intake/Output this shift: Total I/O In: 240 [P.O.:240] Out: 400 [Urine:400]  Mild cellulitis, penrose in place with expected drainage   Lab Results:  Recent Labs    06/26/23 0852 06/27/23 0559  WBC 18.3* 17.5*  HGB 12.1* 11.4*  HCT 35.3* 33.6*  PLT 284 278   BMET Recent Labs    06/25/23 0604 06/27/23 0559  NA 137 137  K 3.7 3.7  CL 103 104  CO2 26 25  GLUCOSE 99 98  BUN 10 13  CREATININE 0.98 0.97  CALCIUM 8.6* 8.2*   PT/INR No results for input(s): "LABPROT", "INR" in the last 72 hours. ABG No results for input(s): "PHART", "HCO3" in the last 72 hours.  Invalid input(s): "PCO2", "PO2"  Studies/Results: No results found.  Anti-infectives: Anti-infectives (From admission, onward)    Start     Dose/Rate Route Frequency Ordered Stop   06/25/23 0800  darunavir (PREZISTA) tablet 800 mg        800 mg Oral Daily with breakfast 06/24/23 1258     06/24/23 2200  cefTRIAXone (ROCEPHIN) 2 g in sodium chloride 0.9 % 100 mL IVPB        2 g 200 mL/hr over 30 Minutes Intravenous Every 24 hours 06/24/23 1006     06/24/23 2200  vancomycin (VANCOREADY) IVPB 1750 mg/350 mL        1,750 mg 175 mL/hr over 120 Minutes Intravenous Every 24 hours 06/24/23 1010     06/24/23 1245  elvitegravir-cobicistat-emtricitabine-tenofovir (GENVOYA) 150-150-200-10 MG tablet 1 tablet        1 tablet Oral Daily with breakfast 06/24/23 1129     06/24/23 1245  darunavir (PREZISTA) tablet 800 mg  Status:   Discontinued        800 mg Oral Daily with breakfast 06/24/23 1129 06/24/23 1258   06/24/23 1100  metroNIDAZOLE (FLAGYL) IVPB 500 mg        500 mg 100 mL/hr over 60 Minutes Intravenous Every 12 hours 06/24/23 1006     06/24/23 0004  vancomycin (VANCOCIN) IVPB 1000 mg/200 mL premix       Placed in "Followed by" Linked Group   1,000 mg 200 mL/hr over 60 Minutes Intravenous  Once 06/23/23 2304 06/24/23 0131   06/23/23 2315  vancomycin (VANCOCIN) IVPB 1000 mg/200 mL premix       Placed in "Followed by" Linked Group   1,000 mg 200 mL/hr over 60 Minutes Intravenous  Once 06/23/23 2304 06/24/23 0026   06/23/23 2115  cefTRIAXone (ROCEPHIN) 2 g in sodium chloride 0.9 % 100 mL IVPB        2 g 200 mL/hr over 30 Minutes Intravenous Once 06/23/23 2108 06/23/23 2344   06/23/23 2115  metroNIDAZOLE (FLAGYL) IVPB 500 mg        500 mg 100 mL/hr over 60 Minutes Intravenous  Once 06/23/23 2108 06/23/23 2234       Assessment/Plan: POD 2 I/D perianal abscess -penrose to remain and to be removed in office later -continue abx -wbc  still up although does not appear anything more surgical needs to be done   Emelia Loron 06/27/2023

## 2023-06-27 NOTE — TOC CM/SW Note (Signed)
Consult for follow up with PCP in 1 to 2 weeks. Called [facility] office. No appointments available in that time frame. However, since he is an active patient of [facility] they will send a message to [facility] and her nurse. They will work patient in. They will call patient directly with a date and time .

## 2023-06-27 NOTE — Progress Notes (Signed)
PROGRESS NOTE    Shawn Brooks  XLK:[id number] DOB: 1975-08-17 DOA: 06/23/2023 PCP: Marcine Matar, MD  Subjective: Pt seen and examined. Messaged with Dr. Dwain Sarna with general surgery. He has cleared pt for discharge today.   Hospital Course: HPI: Shawn Brooks is a 46 y.o. male with medical history significant of HIV, HSV, anxiety, OSA, HLD who presented to ED with complaints of rectal abscess/pain. He states his buttocks has been sore for the past 4-5 days. Yesterday AM he went to the bathroom and was cleaning himself and as he wiped a huge gush came onto his hand. He states it was blood/pus/liquid. States it was not from his anus more on the side. No poop/fecal matter in the discharge. He states it continued to drain for like 30 minutes. He states the pain in his left buttocks was gone and he was able to lay on his side comfortably. He put some tissue in the draining area and went to bed. His partner came home from work and looked at area and then the other side opened up and started to drain. He continued to have pain on the right side of his buttocks so he came to ED. He has not been able to urinate x 2 days. In ER he had 900cc of urine and a foley was placed. He has never had urinary retention before. He also has not had a BM or passed gas since yesterday. He denies any fevers, N/V at home. Denies any abdominal pain.  He has had poor PO intake since Monday/Tuesday this week.    Denies any fever/chills, vision changes/headaches, chest pain or palpitations, shortness of breath or cough, abdominal pain, N/V/D, dysuria or leg swelling. He has had no sexual intercourse in past 1.5 years.    Seen by GI on 06/17/23 for anal fissure and constipation. He was on Viberzi for diarrhea, but stopped this due to constipation. He was given cream for the fissure. Given diltiazem with lidocaine.    He smokes 1/2 PPD and no longer drinks alcohol on a regular basis.    ER Course:  vitals: temp:  100.4, bp: 117/88, HR; 113, RR: 20, oxygen: 96% on RA Pertinent labs: creatinine: 1.36, potassium: 3.3, WBC: 29.3,  CT pelvis with contrast: Air and inflammatory change surrounding the anus consistent with the given clinical history of perianal abscess and recent rupture with drainage. No significant residual fluid collection is identified. In ED: general surgery consulted. Started on broad spectrum IV abx. BC obtained, IVF. TRH asked to admit.   Significant Events: Admitted 06/23/2023 rectal/peri-rectal abscess    Antibiotic Therapy: Anti-infectives (From admission, onward)    Start     Dose/Rate Route Frequency Ordered Stop   06/25/23 0800  darunavir (PREZISTA) tablet 800 mg        800 mg Oral Daily with breakfast 06/24/23 1258     06/24/23 2200  cefTRIAXone (ROCEPHIN) 2 g in sodium chloride 0.9 % 100 mL IVPB        2 g 200 mL/hr over 30 Minutes Intravenous Every 24 hours 06/24/23 1006     06/24/23 2200  vancomycin (VANCOREADY) IVPB 1750 mg/350 mL        1,750 mg 175 mL/hr over 120 Minutes Intravenous Every 24 hours 06/24/23 1010     06/24/23 1245  elvitegravir-cobicistat-emtricitabine-tenofovir (GENVOYA) 150-150-200-10 MG tablet 1 tablet        1 tablet Oral Daily with breakfast 06/24/23 1129     06/24/23 1245  darunavir (PREZISTA)  tablet 800 mg  Status:  Discontinued        800 mg Oral Daily with breakfast 06/24/23 1129 06/24/23 1258   06/24/23 1100  metroNIDAZOLE (FLAGYL) IVPB 500 mg        500 mg 100 mL/hr over 60 Minutes Intravenous Every 12 hours 06/24/23 1006     06/24/23 0004  vancomycin (VANCOCIN) IVPB 1000 mg/200 mL premix       Placed in "Followed by" Linked Group   1,000 mg 200 mL/hr over 60 Minutes Intravenous  Once 06/23/23 2304 06/24/23 0131   06/23/23 2315  vancomycin (VANCOCIN) IVPB 1000 mg/200 mL premix       Placed in "Followed by" Linked Group   1,000 mg 200 mL/hr over 60 Minutes Intravenous  Once 06/23/23 2304 06/24/23 0026   06/23/23 2115  cefTRIAXone  (ROCEPHIN) 2 g in sodium chloride 0.9 % 100 mL IVPB        2 g 200 mL/hr over 30 Minutes Intravenous Once 06/23/23 2108 06/23/23 2344   06/23/23 2115  metroNIDAZOLE (FLAGYL) IVPB 500 mg        500 mg 100 mL/hr over 60 Minutes Intravenous  Once 06/23/23 2108 06/23/23 2234       Procedures: I&D of perirectal abscess  Consultants: General surgery    Assessment and Plan: * Perianal abscess 47 year old presenting with bilateral gluteal pain and drainage found to be septic with imaging showing air and inflammatory changes surrounding the anus consistent with given history of perianal abscess and recent rupture with drainage -admit to med surg  -continue IVF x 24 hours -sepsis physiology resolving. Afebrile, NSR -presented with WBC to 29 -continue broad spectrum abx -BC pending -general surgery consulted. Plan for broad spectrum antibiotics today and NPO at midnight. If worsening symptoms or fever may take to OR tomorrow  -pain medication with dilaudid as needed   Summary: pt underwent I&D of perirectal abscess on 06-25-2023. Abscess culture have GNR and GPC. Discussed with Dr. Dwain Sarna with surgery. He has cleared pt for DC. Pt feeling better. Leukocytosis improving. Will DC to home with additional 7 days of po augmentin. Pt had completed 4 days of IV rocephin.  AKI (acute kidney injury) (HCC) AKI vs. CKD Last creatinine 3 months ago was 1.35 Did have 900cc and required foley which could be contributing  Will check renal US to r/o post obstructive AKI vs. Abscess contributing  CT pelvis with normal prostate and significantly distended bladder  UA with ketones  Strict I/O Start flomax Trend   Summary: pt's AKI resolved with treatment of his urinary retention that was due to his rectal abscess.   Acute urinary retention -no urinary output x 2 days -bladder scan showed 900 cc and foley placed on 06/23/23 -check US renal -CT showed normal prostate, significant distended  bladder -start flomax -trial voiding in next day or two   Summary: acute urinary retention due to rectal abscess. Once rectal abscess was treated and I&D, pt's foley was removed. Pt able to urinate without foley.  Prediabetes 03/2023: A1C of 5.7 Continue diet and lifestyle modifications   Herpes simplex virus (HSV) infection Continue valacyclovir PRN   Human immunodeficiency virus (HIV) disease (HCC) Followed by ID. Viral load undetectable in 02/2023  Continue genvoya and prezista   Social anxiety disorder Continue prozac and buspar daily   Mixed hyperlipidemia Continue crestor   Obstructive sleep apnea Sleep study pending   Tobacco abuse Smokes 1/2 PPD Declines nicotine patch  DVT prophylaxis: SCDs Start: 06/24/23 1204     Code Status: Full Code Family Communication: no family at bedside Disposition Plan: return home Reason for continuing need for hospitalization: medically stable. General surgery has cleared patient for DC.  Objective: Vitals:   06/26/23 1727 06/26/23 2143 06/27/23 0359 06/27/23 0845  BP: 107/63 112/74 100/74 114/67  Pulse: 69 67 67 71  Resp: 16 18 18 18   Temp: 97.8 F (36.6 C) 99.1 F (37.3 C) 97.9 F (36.6 C) 98.1 F (36.7 C)  TempSrc: Oral Oral Oral Oral  SpO2: 99% 97% 97% 97%  Weight:      Height:        Intake/Output Summary (Last 24 hours) at 06/27/2023 1134 Last data filed at 06/27/2023 0845 Gross per 24 hour  Intake 1334.13 ml  Output 1250 ml  Net 84.13 ml   Filed Weights   06/24/23 1336 06/25/23 1320  Weight: 101 kg 101 kg    Examination:  Physical Exam Vitals and nursing note reviewed.  Constitutional:      General: He is not in acute distress.    Appearance: He is normal weight. He is not toxic-appearing.  HENT:     Head: Normocephalic and atraumatic.     Nose: Nose normal.  Eyes:     General: No scleral icterus. Cardiovascular:     Rate and Rhythm: Normal rate and regular rhythm.  Pulmonary:      Effort: Pulmonary effort is normal.  Abdominal:     General: Abdomen is flat.  Musculoskeletal:     Right lower leg: No edema.  Skin:    General: Skin is warm and dry.     Capillary Refill: Capillary refill takes less than 2 seconds.  Neurological:     Mental Status: He is alert and oriented to person, place, and time.     Data Reviewed: I have personally reviewed following labs and imaging studies  CBC: Recent Labs  Lab 06/23/23 2107 06/24/23 1211 06/25/23 0604 06/26/23 0852 06/27/23 0559  WBC 29.3* 22.4* 14.5* 18.3* 17.5*  NEUTROABS 25.6* 18.9*  --   --   --   HGB 13.6 12.5* 12.1* 12.1* 11.4*  HCT 39.1 36.4* 35.2* 35.3* 33.6*  MCV 86.7 87.7 87.8 87.6 87.7  PLT 266 253 239 284 278   Basic Metabolic Panel: Recent Labs  Lab 06/23/23 2107 06/24/23 1211 06/24/23 1300 06/25/23 0604 06/27/23 0559  NA 135 134*  --  137 137  K 3.3* 3.3*  --  3.7 3.7  CL 93* 97*  --  103 104  CO2 27 25  --  26 25  GLUCOSE 108* 88  --  99 98  BUN 19 12  --  10 13  CREATININE 1.36* 1.06  --  0.98 0.97  CALCIUM 9.8 8.8*  --  8.6* 8.2*  MG  --   --  2.2  --   --    GFR: Estimated Creatinine Clearance: 106.1 mL/min (by C-G formula based on SCr of 0.97 mg/dL). Liver Function Tests: Recent Labs  Lab 06/23/23 2107  AST 24  ALT 13  ALKPHOS 106  BILITOT 0.7  PROT 8.0  ALBUMIN 3.9   Coagulation Profile: Recent Labs  Lab 06/23/23 2107  INR 1.2   Anemia Panel: Recent Labs    06/24/23 1211  FOLATE 12.6   Sepsis Labs: Recent Labs  Lab 06/23/23 2107  LATICACIDVEN 1.9    Recent Results (from the past 240 hours)  Blood Culture (routine x 2)  Status: None (Preliminary result)   Collection Time: 06/23/23  9:00 PM   Specimen: BLOOD  Result Value Ref Range Status   Specimen Description   Final    BLOOD RIGHT ANTECUBITAL Performed at [facility], [address], [place], [place] [address]    Special Requests   Final    BOTTLES DRAWN AEROBIC AND  ANAEROBIC Blood Culture results may not be optimal due to an excessive volume of blood received in culture bottles Performed at [facility], [address], [place], [place] [address]    Culture   Final    NO GROWTH 2 DAYS Performed at [facility], [address], [place], [place] [address]    Report Status PENDING  Incomplete  Blood Culture (routine x 2)     Status: None (Preliminary result)   Collection Time: 06/23/23  9:20 PM   Specimen: BLOOD  Result Value Ref Range Status   Specimen Description   Final    BLOOD LEFT ANTECUBITAL Performed at [facility], [address], [place], [place] [address]    Special Requests   Final    BOTTLES DRAWN AEROBIC ONLY Blood Culture results may not be optimal due to an excessive volume of blood received in culture bottles Performed at [facility], [address] [place], [place] [address]    Culture   Final    NO GROWTH 2 DAYS Performed at [facility], [address], [place], [place] [address]    Report Status PENDING  Incomplete  Aerobic/Anaerobic Culture w Gram Stain (surgical/deep wound)     Status: None (Preliminary result)   Collection Time: 06/25/23  2:33 PM   Specimen: Path fluid; Body Fluid  Result Value Ref Range Status   Specimen Description ABSCESS  Final   Special Requests perirectal  Final   Gram Stain   Final    ABUNDANT WBC PRESENT, PREDOMINANTLY PMN MODERATE GRAM POSITIVE COCCI MODERATE GRAM NEGATIVE RODS RARE GRAM POSITIVE RODS    Culture   Final    CULTURE REINCUBATED FOR BETTER GROWTH Performed at [facility], [address], [place], [place] [address]    Report Status PENDING  Incomplete     Radiology Studies: No results found.  Scheduled Meds:  busPIRone  7.5 mg Oral QHS   Chlorhexidine Gluconate Cloth  6 each Topical Q0600   darunavir  800 mg Oral Q breakfast   docusate sodium  200 mg Oral BID    elvitegravir-cobicistat-emtricitabine-tenofovir  1 tablet Oral Q breakfast   FLUoxetine  60 mg Oral Daily   [START ON 06/28/2023] polyethylene glycol  34 g Oral Daily   rosuvastatin  5 mg Oral Daily   tamsulosin  0.4 mg Oral QPC supper   trazodone  250 mg Oral QHS   Continuous Infusions:  cefTRIAXone (ROCEPHIN)  IV 2 g (06/26/23 2029)   metronidazole 500 mg (06/27/23 0011)   vancomycin 1,750 mg (06/26/23 2149)     LOS: 3 days   Time spent: 40 minutes  Carollee Herter, DO  [facility]  06/27/2023, 11:34 AM

## 2023-06-27 NOTE — Plan of Care (Signed)
  Problem: Pain Management: Goal: General experience of comfort will improve Outcome: Progressing   Problem: Safety: Goal: Ability to remain free from injury will improve Outcome: Progressing   Problem: Skin Integrity: Goal: Risk for impaired skin integrity will decrease Outcome: Progressing

## 2023-06-27 NOTE — Discharge Summary (Signed)
[facility] Phys[facility] Discharge Summary   Patient name: Shawn Brooks  Admit date:     06/23/2023  Discharge date: 06/27/2023  Attending Physician: Hannah Beat [[id number]]  Discharge Physician: Carollee Herter   PCP: Marcine Matar, MD  Admitted From: Home  Disposition:  Home  Recommendations for Outpatient Follow-up:  Follow up with PCP in 1-2 weeks Follow up with general surgery in 1 weeks for drain removal  Home Health:No Equipment/Devices: None  Discharge Condition:Stable CODE STATUS:FULL Diet recommendation: Regular Fluid Restriction: None  Hospital Summary: HPI: Shawn Brooks is a 47 y.o. male with medical history significant of HIV, HSV, anxiety, OSA, HLD who presented to ED with complaints of rectal abscess/pain. He states his buttocks has been sore for the past 4-5 days. Yesterday AM he went to the bathroom and was cleaning himself and as he wiped a huge gush came onto his hand. He states it was blood/pus/liquid. States it was not from his anus more on the side. No poop/fecal matter in the discharge. He states it continued to drain for like 30 minutes. He states the pain in his left buttocks was gone and he was able to lay on his side comfortably. He put some tissue in the draining area and went to bed. His partner came home from work and looked at area and then the other side opened up and started to drain. He continued to have pain on the right side of his buttocks so he came to ED. He has not been able to urinate x 2 days. In ER he had 900cc of urine and a foley was placed. He has never had urinary retention before. He also has not had a BM or passed gas since yesterday. He denies any fevers, N/V at home. Denies any abdominal pain.  He has had poor PO intake since Monday/Tuesday this week.    Denies any fever/chills, vision changes/headaches, chest pain or palpitations, shortness of breath or cough, abdominal pain, N/V/D, dysuria or leg swelling. He has had no  sexual intercourse in past 1.5 years.    Seen by GI on 06/17/23 for anal fissure and constipation. He was on Viberzi for diarrhea, but stopped this due to constipation. He was given cream for the fissure. Given diltiazem with lidocaine.    He smokes 1/2 PPD and no longer drinks alcohol on a regular basis.    ER Course:  vitals: temp: 100.4, bp: 117/88, HR; 113, RR: 20, oxygen: 96% on RA Pertinent labs: creatinine: 1.36, potassium: 3.3, WBC: 29.3,  CT pelvis with contrast: Air and inflammatory change surrounding the anus consistent with the given clinical history of perianal abscess and recent rupture with drainage. No significant residual fluid collection is identified. In ED: general surgery consulted. Started on broad spectrum IV abx. BC obtained, IVF. TRH asked to admit.   Significant Events: Admitted 06/23/2023 rectal/peri-rectal abscess    Antibiotic Therapy: Anti-infectives (From admission, onward)    Start     Dose/Rate Route Frequency Ordered Stop   06/25/23 0800  darunavir (PREZISTA) tablet 800 mg        800 mg Oral Daily with breakfast 06/24/23 1258     06/24/23 2200  cefTRIAXone (ROCEPHIN) 2 g in sodium chloride 0.9 % 100 mL IVPB        2 g 200 mL/hr over 30 Minutes Intravenous Every 24 hours 06/24/23 1006     06/24/23 2200  vancomycin (VANCOREADY) IVPB 1750 mg/350 mL  1,750 mg 175 mL/hr over 120 Minutes Intravenous Every 24 hours 06/24/23 1010     06/24/23 1245  elvitegravir-cobicistat-emtricitabine-tenofovir (GENVOYA) 150-150-200-10 MG tablet 1 tablet        1 tablet Oral Daily with breakfast 06/24/23 1129     06/24/23 1245  darunavir (PREZISTA) tablet 800 mg  Status:  Discontinued        800 mg Oral Daily with breakfast 06/24/23 1129 06/24/23 1258   06/24/23 1100  metroNIDAZOLE (FLAGYL) IVPB 500 mg        500 mg 100 mL/hr over 60 Minutes Intravenous Every 12 hours 06/24/23 1006     06/24/23 0004  vancomycin (VANCOCIN) IVPB 1000 mg/200 mL premix       Placed in  "Followed by" Linked Group   1,000 mg 200 mL/hr over 60 Minutes Intravenous  Once 06/23/23 2304 06/24/23 0131   06/23/23 2315  vancomycin (VANCOCIN) IVPB 1000 mg/200 mL premix       Placed in "Followed by" Linked Group   1,000 mg 200 mL/hr over 60 Minutes Intravenous  Once 06/23/23 2304 06/24/23 0026   06/23/23 2115  cefTRIAXone (ROCEPHIN) 2 g in sodium chloride 0.9 % 100 mL IVPB        2 g 200 mL/hr over 30 Minutes Intravenous Once 06/23/23 2108 06/23/23 2344   06/23/23 2115  metroNIDAZOLE (FLAGYL) IVPB 500 mg        500 mg 100 mL/hr over 60 Minutes Intravenous  Once 06/23/23 2108 06/23/23 2234       Procedures: I&D of perirectal abscess  Consultants: General surgery   Hospital Course by Problem: * Perianal abscess 47 year old presenting with bilateral gluteal pain and drainage found to be septic with imaging showing air and inflammatory changes surrounding the anus consistent with given history of perianal abscess and recent rupture with drainage -admit to med surg  -continue IVF x 24 hours -sepsis physiology resolving. Afebrile, NSR -presented with WBC to 29 -continue broad spectrum abx -BC pending -general surgery consulted. Plan for broad spectrum antibiotics today and NPO at midnight. If worsening symptoms or fever may take to OR tomorrow  -pain medication with dilaudid as needed   Summary: pt underwent I&D of perirectal abscess on 06-25-2023. Abscess culture have GNR and GPC. Discussed with Dr. Dwain Sarna with surgery. He has cleared pt for DC. Pt feeling better. Leukocytosis improving. Will DC to home with additional 7 days of po augmentin. Pt had completed 4 days of IV rocephin.  AKI (acute kidney injury) (HCC) AKI vs. CKD Last creatinine 3 months ago was 1.35 Did have 900cc and required foley which could be contributing  Will check renal US to r/o post obstructive AKI vs. Abscess contributing  CT pelvis with normal prostate and significantly distended bladder  UA  with ketones  Strict I/O Start flomax Trend   Summary: pt's AKI resolved with treatment of his urinary retention that was due to his rectal abscess.   Acute urinary retention -no urinary output x 2 days -bladder scan showed 900 cc and foley placed on 06/23/23 -check US renal -CT showed normal prostate, significant distended bladder -start flomax -trial voiding in next day or two   Summary: acute urinary retention due to rectal abscess. Once rectal abscess was treated and I&D, pt's foley was removed. Pt able to urinate without foley.  Prediabetes 03/2023: A1C of 5.7 Continue diet and lifestyle modifications   Herpes simplex virus (HSV) infection Continue valacyclovir PRN   Human immunodeficiency virus (HIV) disease (HCC)  Followed by ID. Viral load undetectable in 02/2023  Continue genvoya and prezista   Social anxiety disorder Continue prozac and buspar daily   Mixed hyperlipidemia Continue crestor   Obstructive sleep apnea Sleep study pending   Tobacco abuse Smokes 1/2 PPD Declines nicotine patch   Obesity, Class I, BMI 30-34.9 BMI 33.86    Discharge Diagnoses:  Principal Problem:   Perianal abscess Active Problems:   AKI (acute kidney injury) (HCC)   Acute urinary retention   Herpes simplex virus (HSV) infection   Prediabetes   Human immunodeficiency virus (HIV) disease (HCC)   Social anxiety disorder   Mixed hyperlipidemia   Obstructive sleep apnea   Tobacco abuse   Obesity, Class I, BMI 30-34.9   Discharge Instructions  Discharge Instructions     Call MD for:  extreme fatigue   Complete by: As directed    Call MD for:  persistant dizziness or light-headedness   Complete by: As directed    Call MD for:  persistant nausea and vomiting   Complete by: As directed    Call MD for:  redness, tenderness, or signs of infection (pain, swelling, redness, odor or green/yellow discharge around incision site)   Complete by: As directed    Call MD for:   temperature >100.4   Complete by: As directed    Diet - low sodium heart healthy   Complete by: As directed    Discharge instructions   Complete by: As directed    1. Follow up with general surgery in 1 week for peri-rectal/buttock drain removal. Call office [phone] for appointment to be seen in 1 week.   Discharge wound care:   Complete by: As directed    1. Wash wound area carefully with soap and warm water once or twice a day.  Pat dry, do not rub. Cover area with dry bandage. Change when soiled and at least twice a day.   Increase activity slowly   Complete by: As directed       Allergies as of 06/27/2023       Reactions   Chantix [varenicline] Other (See Comments)   Bad dreams   Oxycodone Itching        Medication List     TAKE these medications    albuterol 108 (90 Base) MCG/ACT inhaler Commonly known as: VENTOLIN HFA Inhale 2 puffs into the lungs every 6 (six) hours as needed for wheezing or shortness of breath.   AMBULATORY NON FORMULARY MEDICATION Medication Name: Diltiazem 2% ION:[id number] 5% - using your index finger, you should apply a small amount of medication inside the rectum up to your first knuckle/joint three times daily x 6-8 weeks.   amoxicillin-clavulanate 875-125 MG tablet Commonly known as: AUGMENTIN Take 1 tablet by mouth 2 (two) times daily for 7 days.   busPIRone 7.5 MG tablet Commonly known as: BUSPAR Take 7.5 mg by mouth at bedtime.   darunavir 800 MG tablet Commonly known as: PREZISTA TAKE 1 TABLET(800 MG) BY MOUTH DAILY   docusate sodium 100 MG capsule Commonly known as: COLACE Take 2 capsules (200 mg total) by mouth 2 (two) times daily.   FLUoxetine 20 MG capsule Commonly known as: PROzac Take 3 capsules (60 mg total) by mouth daily.   Genvoya 150-150-200-10 MG Tabs tablet Generic drug: elvitegravir-cobicistat-emtricitabine-tenofovir TAKE 1 TABLET BY MOUTH DAILY   HYDROcodone-acetaminophen 5-325 MG tablet Commonly  known as: NORCO/VICODIN Take 1-2 tablets by mouth every 6 (six) hours as needed for up to 5  days for moderate pain (pain score 4-6). What changed:  how much to take when to take this   polyethylene glycol 17 g packet Commonly known as: MIRALAX / GLYCOLAX Take 17 g by mouth daily.   rosuvastatin 5 MG tablet Commonly known as: CRESTOR Take 5 mg by mouth daily.   tamsulosin 0.4 MG Caps capsule Commonly known as: FLOMAX Take 1 capsule (0.4 mg total) by mouth daily after supper.   trazodone 300 MG tablet Commonly known as: DESYREL Take 300 mg by mouth at bedtime.   valACYclovir 500 MG tablet Commonly known as: VALTREX TAKE 1 TABLET BY MOUTH TWICE DAILY What changed:  how much to take how to take this when to take this reasons to take this additional instructions   Vitamin D (Ergocalciferol) 1.25 MG (50000 UNIT) Caps capsule Commonly known as: DRISDOL TAKE 1 CAPSULE BY MOUTH EVERY 7 DAYS               Discharge Care Instructions  (From admission, onward)           Start     Ordered   06/27/23 0000  Discharge wound care:       Comments: 1. Wash wound area carefully with soap and warm water once or twice a day.  Pat dry, do not rub. Cover area with dry bandage. Change when soiled and at least twice a day.   06/27/23 1148            Allergies  Allergen Reactions   Chantix [Varenicline] Other (See Comments)    Bad dreams   Oxycodone Itching    Discharge Exam: Vitals:   06/27/23 0359 06/27/23 0845  BP: 100/74 114/67  Pulse: 67 71  Resp: 18 18  Temp: 97.9 F (36.6 C) 98.1 F (36.7 C)  SpO2: 97% 97%    Physical Exam Vitals and nursing note reviewed.  Constitutional:      General: He is not in acute distress.    Appearance: He is obese. He is not toxic-appearing or diaphoretic.  HENT:     Head: Normocephalic and atraumatic.  Neurological:     Mental Status: He is alert.     The results of significant diagnostics from this hospitalization  (including imaging, microbiology, ancillary and laboratory) are listed below for reference.    Microbiology: Recent Results (from the past 240 hours)  Blood Culture (routine x 2)     Status: None (Preliminary result)   Collection Time: 06/23/23  9:00 PM   Specimen: BLOOD  Result Value Ref Range Status   Specimen Description   Final    BLOOD RIGHT ANTECUBITAL Performed at [facility], [address], [place], [place] [address]    Special Requests   Final    BOTTLES DRAWN AEROBIC AND ANAEROBIC Blood Culture results may not be optimal due to an excessive volume of blood received in culture bottles Performed at [facility], [address] [place], [place] [address]    Culture   Final    NO GROWTH 2 DAYS Performed at [facility], [address] [place], [place] [address]    Report Status PENDING  Incomplete  Blood Culture (routine x 2)     Status: None (Preliminary result)   Collection Time: 06/23/23  9:20 PM   Specimen: BLOOD  Result Value Ref Range Status   Specimen Description   Final    BLOOD LEFT ANTECUBITAL Performed at [facility], [address], [place], [place]  [address]    Special Requests   Final    BOTTLES DRAWN AEROBIC ONLY Blood Culture results may not be optimal due to an excessive volume of blood received in culture bottles Performed at [facility], [address] [place], [place] [address]    Culture   Final    NO GROWTH 2 DAYS Performed at [facility], [address] [place], [place] [address]    Report Status PENDING  Incomplete  Aerobic/Anaerobic Culture w Gram Stain (surgical/deep wound)     Status: None (Preliminary result)   Collection Time: 06/25/23  2:33 PM   Specimen: Path fluid; Body Fluid  Result Value Ref Range Status   Specimen Description ABSCESS  Final   Special Requests perirectal  Final   Gram Stain   Final    ABUNDANT WBC PRESENT,  PREDOMINANTLY PMN MODERATE GRAM POSITIVE COCCI MODERATE GRAM NEGATIVE RODS RARE GRAM POSITIVE RODS    Culture   Final    CULTURE REINCUBATED FOR BETTER GROWTH Performed at [facility], [address], [place], [place] [address]    Report Status PENDING  Incomplete     Labs: Basic Metabolic Panel: Recent Labs  Lab 06/23/23 2107 06/24/23 1211 06/24/23 1300 06/25/23 0604 06/27/23 0559  NA 135 134*  --  137 137  K 3.3* 3.3*  --  3.7 3.7  CL 93* 97*  --  103 104  CO2 27 25  --  26 25  GLUCOSE 108* 88  --  99 98  BUN 19 12  --  10 13  CREATININE 1.36* 1.06  --  0.98 0.97  CALCIUM 9.8 8.8*  --  8.6* 8.2*  MG  --   --  2.2  --   --    Liver Function Tests: Recent Labs  Lab 06/23/23 2107  AST 24  ALT 13  ALKPHOS 106  BILITOT 0.7  PROT 8.0  ALBUMIN 3.9   CBC: Recent Labs  Lab 06/23/23 2107 06/24/23 1211 06/25/23 0604 06/26/23 0852 06/27/23 0559  WBC 29.3* 22.4* 14.5* 18.3* 17.5*  NEUTROABS 25.6* 18.9*  --   --   --   HGB 13.6 12.5* 12.1* 12.1* 11.4*  HCT 39.1 36.4* 35.2* 35.3* 33.6*  MCV 86.7 87.7 87.8 87.6 87.7  PLT 266 253 239 284 278   Anemia work up Recent Labs    06/24/23 1211  FOLATE 12.6   Urinalysis    Component Value Date/Time   COLORURINE YELLOW 06/23/2023 0041   APPEARANCEUR CLEAR 06/23/2023 0041   APPEARANCEUR Clear 09/01/2017 1107   LABSPEC >1.046 (H) 06/23/2023 0041   PHURINE 5.5 06/23/2023 0041   GLUCOSEU NEGATIVE 06/23/2023 0041   GLUCOSEU NEG mg/dL 29/56/2130 8657   HGBUR SMALL (A) 06/23/2023 0041   BILIRUBINUR NEGATIVE 06/23/2023 0041   BILIRUBINUR Negative 09/01/2017 1107   KETONESUR 15 (A) 06/23/2023 0041   PROTEINUR 30 (A) 06/23/2023 0041   UROBILINOGEN 0.2 09/24/2012 1139   NITRITE NEGATIVE 06/23/2023 0041   LEUKOCYTESUR NEGATIVE 06/23/2023 0041   Sepsis Labs Recent Labs  Lab 06/24/23 1211 06/25/23 0604 06/26/23 0852 06/27/23 0559  WBC 22.4* 14.5* 18.3* 17.5*   Microbiology Recent Results (from the past 240  hours)  Blood Culture (routine x 2)     Status: None (Preliminary result)   Collection Time: 06/23/23  9:00 PM   Specimen: BLOOD  Result Value Ref Range Status   Specimen Description   Final    BLOOD RIGHT ANTECUBITAL Performed at [facility], [address] [place], [place] [address]    Special Requests   Final    BOTTLES DRAWN AEROBIC AND ANAEROBIC Blood Culture results may not be optimal due to an excessive volume of blood received in culture bottles Performed at [facility], [address] [place], [place] [address]    Culture   Final    NO GROWTH 2 DAYS Performed at [facility], [address], [place], [place] [address]    Report Status PENDING  Incomplete  Blood Culture (routine x 2)     Status: None (Preliminary result)   Collection Time: 06/23/23  9:20 PM   Specimen: BLOOD  Result Value Ref Range Status   Specimen Description   Final    BLOOD LEFT ANTECUBITAL Performed at [facility], [address] [place], [place] [address]    Special Requests   Final    BOTTLES DRAWN AEROBIC ONLY Blood Culture results may not be optimal due to an excessive volume of blood received in culture bottles Performed at [facility], [address], [place], [place] [address]    Culture   Final    NO GROWTH 2 DAYS Performed at [facility], [address], [place], [place] [address]    Report Status PENDING  Incomplete  Aerobic/Anaerobic Culture w Gram Stain (surgical/deep wound)     Status: None (Preliminary result)   Collection Time: 06/25/23  2:33 PM   Specimen: Path fluid; Body Fluid  Result Value Ref Range Status   Specimen Description ABSCESS  Final   Special Requests perirectal  Final   Gram Stain   Final    ABUNDANT WBC PRESENT, PREDOMINANTLY PMN MODERATE GRAM POSITIVE COCCI MODERATE GRAM NEGATIVE RODS RARE GRAM POSITIVE RODS    Culture   Final    CULTURE REINCUBATED FOR  BETTER GROWTH Performed at [facility], [address] [place], [place] [address]    Report Status PENDING  Incomplete    Procedures/Studies: US RENAL Result Date: 06/25/2023 CLINICAL DATA:  Urinary retention EXAM: RENAL / URINARY TRACT ULTRASOUND COMPLETE COMPARISON:  CT enterography 08/10/2019 FINDINGS: Right Kidney: Renal measurements: 10.6 x 5.4 x 6.5 cm = volume: 198 mL. Echogenicity within normal limits. No mass or hydronephrosis visualized. Left Kidney: Renal measurements: 12.3 x 6.0 x 6.0 cm = volume: 267 mL. Echogenicity within normal limits. No mass or hydronephrosis visualized. Bladder: No significant abnormality. Foley catheter balloon is noted within the bladder lumen. Other: None. IMPRESSION: No significant sonographic abnormality of the kidneys. Electronically Signed   By: Acquanetta Belling M.D.   On: 06/25/2023 09:35   CT PELVIS W CONTRAST Result Date: 06/23/2023 CLINICAL DATA:  Pain and swelling in the rectal area for 2 days with drainage, initial encounter EXAM: CT PELVIS WITH CONTRAST TECHNIQUE: Multidetector CT imaging of the pelvis was performed using the standard protocol following the bolus administration of intravenous contrast. RADIATION DOSE REDUCTION: This exam was performed according to the departmental dose-optimization program which includes automated exposure control, adjustment of the mA and/or kV according to patient size and/or use of iterative reconstruction technique. CONTRAST:  OMNIPAQUE IOHEXOL 300 MG/ML  SOLN COMPARISON:  None Available. FINDINGS: Urinary Tract: Bladder is well distended. No obstructing abnormality is seen. Bowel: The appendix is within normal limits. Visualized small bowel and colon are within normal limits with the exception of perianal inflammatory change. Multiple foci of air are noted circumferentially surrounding the anus slightly worse on the right than  the left. Minimal fluid is noted as well although no large sizable collection of  fluid is seen. This corresponds to the given clinical history of recent rupture and drainage. Considerable inflammatory change in the medial aspect of both buttock is seen but again worse on the right than the left. Vascular/Lymphatic: No pathologically enlarged lymph nodes. No significant vascular abnormality seen. Reproductive:  Prostate is within normal limits. Other:  None. Musculoskeletal: No suspicious bone lesions identified. IMPRESSION: Air and inflammatory change surrounding the anus consistent with the given clinical history of perianal abscess and recent rupture with drainage. No significant residual fluid collection is identified. Electronically Signed   By: Alcide Clever M.D.   On: 06/23/2023 22:37    Time coordinating discharge: 50 mins  SIGNED:  Carollee Herter, DO [facility] 06/27/23, 11:51 AM

## 2023-06-27 NOTE — Progress Notes (Signed)
AVS given and reviewed with pt. Medications discussed. Home medication returned to pt from pharmacy. All questions answered to satisfaction. Pt verbalized understanding of information given. Supplies provided for wound care. Pt to be escorted off the unit with all belongings via wheelchair by staff member.

## 2023-06-28 ENCOUNTER — Telehealth: Payer: Self-pay

## 2023-06-28 NOTE — Transitions of Care (Post Inpatient/ED Visit) (Signed)
   06/28/2023  Name: Shawn Brooks MRN: [id number] DOB: 26-Sep-1975  Today's TOC FU Call Status: Today's TOC FU Call Status:: Successful TOC FU Call Completed TOC FU Call Complete Date: 06/28/23 Patient's Name and Date of Birth confirmed.  Transition Care Management Follow-up Telephone Call Date of Discharge: 06/27/23 Discharge Facility: [facility]) Type of Discharge: Inpatient Admission Primary Inpatient Discharge Diagnosis:: perianal abscess How have you been since you were released from the hospital?: Better (He said he is feeling much better but still has some pain and discomfort) Any questions or concerns?: Yes Patient Questions/Concerns:: He is concerned about the peri-rectal drain and keeping it clean.  He said he called the surgeon's office for an appointment and they scheduled him for 07/28/2023.  I explained to him that he needs to contact the surgeon with questions about the drain. I also instructed him to call the surgeon's office back and explain that his AVS states to follow up with the surgeon in 1 week for drain removal and request an earlier appointment.  He said said he will call now. Patient Questions/Concerns Addressed: Other: (patient to call the surgeon's office)  Items Reviewed: Did you receive and understand the discharge instructions provided?: Yes Medications obtained,verified, and reconciled?: No Medications Not Reviewed Reasons:: Other: (He said he has all of his medications and did not have any questions about the med regime and did not need to review the med list.) Any new allergies since your discharge?: No Dietary orders reviewed?: Yes Type of Diet Ordered:: heart healthy, low sodium Do you have support at home?: Yes Name of Support/Comfort Primary Source: He said he has help but did not specify who.  Medications Reviewed Today: Medications Reviewed Today   Medications were not reviewed in this encounter     Home Care and Equipment/Supplies: Were  Home Health Services Ordered?: No Any new equipment or medical supplies ordered?: No  Functional Questionnaire: Do you need assistance with bathing/showering or dressing?: No (Wound care orders reviewed and he said that is what he is doing and he has the dry dressings.) Do you need assistance with meal preparation?: No Do you need assistance with eating?: No Do you have difficulty maintaining continence: No Do you need assistance with getting out of bed/getting out of a chair/moving?: No Do you have difficulty managing or taking your medications?: No  Follow up appointments reviewed: PCP Follow-up appointment confirmed?: Yes Date of PCP follow-up appointment?: 07/19/23 Follow-up Provider: [facility] Follow-up appointment confirmed?: Yes Date of Specialist follow-up appointment?: 07/28/23 Follow-Up Specialty Provider:: surgeon. He is calling the office today to request an earlier appointmet,, Do you need transportation to your follow-up appointment?: No Do you understand care options if your condition(s) worsen?: Yes-patient verbalized understanding    SIGNATURE Slater Diesel, RN

## 2023-06-29 LAB — CULTURE, BLOOD (ROUTINE X 2)
Culture: NO GROWTH
Culture: NO GROWTH

## 2023-06-30 LAB — AEROBIC/ANAEROBIC CULTURE W GRAM STAIN (SURGICAL/DEEP WOUND)

## 2023-07-11 ENCOUNTER — Telehealth: Payer: Self-pay | Admitting: Gastroenterology

## 2023-07-11 NOTE — Telephone Encounter (Signed)
 Patient called and stated that he has been constipated for 2 weeks and has been taking miralax  for a week and half, and have not seen any results. Patient also stated that he has a colonoscopy on the 16th and he start taking his prep on the 15th. Patient is wanting to know when he should stop taking the miralax . Patient is requesting a call back. Please advise.

## 2023-07-12 NOTE — Telephone Encounter (Signed)
 The pt has been advised that he can titrate his miralax as needed to maintain bowel movements.  He has been instructed to continue until he starts prep for colon.  He will call back if needed or if these recommendations do not help.

## 2023-07-13 ENCOUNTER — Telehealth (HOSPITAL_COMMUNITY): Payer: Self-pay | Admitting: *Deleted

## 2023-07-13 DIAGNOSIS — F333 Major depressive disorder, recurrent, severe with psychotic symptoms: Secondary | ICD-10-CM

## 2023-07-13 MED ORDER — TRAZODONE HCL 50 MG PO TABS: 250.0000 mg | ORAL_TABLET | Freq: Every day | ORAL | 0 refills | Status: DC

## 2023-07-13 NOTE — Telephone Encounter (Signed)
 Fax received for refill of Trazodone  50mg  (250mg ) . Chart reviewed, next appointment 08/04/23.

## 2023-07-13 NOTE — Telephone Encounter (Signed)
 Refill Trazodone  250 mg at bedtime sent to preferred pharmacy.

## 2023-07-14 ENCOUNTER — Encounter: Payer: Self-pay | Admitting: Internal Medicine

## 2023-07-14 ENCOUNTER — Ambulatory Visit (AMBULATORY_SURGERY_CENTER): Payer: MEDICAID | Admitting: Internal Medicine

## 2023-07-14 VITALS — BP 107/65 | HR 66 | Temp 98.3°F | Resp 12 | Ht 68.0 in | Wt 215.0 lb

## 2023-07-14 DIAGNOSIS — D124 Benign neoplasm of descending colon: Secondary | ICD-10-CM

## 2023-07-14 DIAGNOSIS — D122 Benign neoplasm of ascending colon: Secondary | ICD-10-CM

## 2023-07-14 DIAGNOSIS — K648 Other hemorrhoids: Secondary | ICD-10-CM | POA: Diagnosis not present

## 2023-07-14 DIAGNOSIS — D123 Benign neoplasm of transverse colon: Secondary | ICD-10-CM | POA: Diagnosis not present

## 2023-07-14 DIAGNOSIS — Z1211 Encounter for screening for malignant neoplasm of colon: Secondary | ICD-10-CM | POA: Diagnosis not present

## 2023-07-14 DIAGNOSIS — Z860101 Personal history of adenomatous and serrated colon polyps: Secondary | ICD-10-CM | POA: Diagnosis not present

## 2023-07-14 MED ORDER — SODIUM CHLORIDE 0.9 % IV SOLN: 500.0000 mL | Freq: Once | INTRAVENOUS | Status: DC

## 2023-07-14 NOTE — Op Note (Signed)
[facility] Patient Name: Shawn Brooks Procedure Date: 07/14/2023 8:38 AM MRN: [id number] Endoscopist: Beverley Fiedler , MD, [id number] Age: 48 Referring MD:  Date of Birth: February 25, 1976 Gender: Male Account #: [id number] Procedure:                Colonoscopy Indications:              High risk colon cancer surveillance: Personal                            history of non-advanced adenoma, Last colonoscopy:                            October 2019 (TA x 1) Medicines:                Monitored Anesthesia Care Procedure:                Pre-Anesthesia Assessment:                           - Prior to the procedure, a History and Physical                            was performed, and patient medications and                            allergies were reviewed. The patient's tolerance of                            previous anesthesia was also reviewed. The risks                            and benefits of the procedure and the sedation                            options and risks were discussed with the patient.                            All questions were answered, and informed consent                            was obtained. Prior Anticoagulants: The patient has                            taken no anticoagulant or antiplatelet agents. ASA                            Grade Assessment: III - A patient with severe                            systemic disease. After reviewing the risks and                            benefits, the patient was deemed in satisfactory  condition to undergo the procedure.                           After obtaining informed consent, the colonoscope                            was passed under direct vision. Throughout the                            procedure, the patient's blood pressure, pulse, and                            oxygen saturations were monitored continuously. The                            Olympus Scope SN: [id number] was  introduced through                            the anus and advanced to the cecum, identified by                            appendiceal orifice and ileocecal valve. The                            colonoscopy was performed without difficulty. The                            patient tolerated the procedure well. The quality                            of the bowel preparation was good. The ileocecal                            valve, appendiceal orifice, and rectum were                            photographed. Scope In: 8:43:58 AM Scope Out: 8:57:32 AM Scope Withdrawal Time: 0 hours 9 minutes 2 seconds  Total Procedure Duration: 0 hours 13 minutes 34 seconds  Findings:                 The perianal exam findings include a right buttock                            scar.                           A 2 mm polyp was found in the ascending colon. The                            polyp was sessile. The polyp was removed with a                            cold biopsy forceps. Resection and retrieval were  complete.                           A 6 mm polyp was found in the transverse colon. The                            polyp was sessile. The polyp was removed with a                            cold snare. Resection and retrieval were complete.                           A 2 mm polyp was found in the descending colon. The                            polyp was sessile. The polyp was removed with a                            cold biopsy forceps. Resection and retrieval were                            complete.                           Internal hemorrhoids were found during                            retroflexion. The hemorrhoids were small. Complications:            No immediate complications. Estimated Blood Loss:     Estimated blood loss was minimal. Impression:               - One 2 mm polyp in the ascending colon, removed                            with a cold biopsy forceps.  Resected and retrieved.                           - One 6 mm polyp in the transverse colon, removed                            with a cold snare. Resected and retrieved.                           - One 2 mm polyp in the descending colon, removed                            with a cold biopsy forceps. Resected and retrieved.                           - Small internal hemorrhoids with post-banding                            scars. Recommendation:           -  Patient has a contact number available for                            emergencies. The signs and symptoms of potential                            delayed complications were discussed with the                            patient. Return to normal activities tomorrow.                            Written discharge instructions were provided to the                            patient.                           - Resume previous diet.                           - Continue present medications.                           - Await pathology results.                           - Repeat colonoscopy is recommended for                            surveillance. The colonoscopy date will be                            determined after pathology results from today's                            exam become available for review. Beverley Fiedler, MD 07/14/2023 9:08:39 AM This report has been signed electronically.

## 2023-07-14 NOTE — Progress Notes (Signed)
Pt A/O x 3, gd SR's, pleased with anesthesia, report to RN

## 2023-07-14 NOTE — Patient Instructions (Signed)

## 2023-07-14 NOTE — Progress Notes (Signed)
See office note dated 06/17/2023 for details and current H&P  Patient presenting for surveillance colonoscopy today.  He remains appropriate for colonoscopy in the [facility] today.

## 2023-07-15 ENCOUNTER — Telehealth: Payer: Self-pay | Admitting: *Deleted

## 2023-07-15 NOTE — Telephone Encounter (Signed)
No answer on  follow up call. Left message.   

## 2023-07-18 LAB — SURGICAL PATHOLOGY

## 2023-07-19 ENCOUNTER — Inpatient Hospital Stay: Payer: MEDICAID | Admitting: Internal Medicine

## 2023-07-21 ENCOUNTER — Encounter: Payer: Self-pay | Admitting: Internal Medicine

## 2023-07-21 ENCOUNTER — Ambulatory Visit (HOSPITAL_BASED_OUTPATIENT_CLINIC_OR_DEPARTMENT_OTHER): Payer: MEDICAID | Attending: Psychiatry | Admitting: Internal Medicine

## 2023-07-21 VITALS — Ht 68.0 in | Wt 223.0 lb

## 2023-07-21 DIAGNOSIS — R0683 Snoring: Secondary | ICD-10-CM

## 2023-07-21 DIAGNOSIS — G4733 Obstructive sleep apnea (adult) (pediatric): Secondary | ICD-10-CM | POA: Diagnosis present

## 2023-07-31 ENCOUNTER — Encounter: Payer: Self-pay | Admitting: Internal Medicine

## 2023-07-31 DIAGNOSIS — G4733 Obstructive sleep apnea (adult) (pediatric): Secondary | ICD-10-CM

## 2023-07-31 NOTE — Procedures (Signed)
    Patient Name: Shawn, Brooks Date: 07/21/2023 Gender: Male D.O.B: 1975-10-12 Age (years): 48 Referring Provider: Jonah Blue MD Height (inches): 68 Interpreting Physician: Jetty Duhamel MD, ABSM Weight (lbs): 223 RPSGT: Cherylann Parr BMI: 34 MRN: [id number] Neck Size: 18.00  CLINICAL INFORMATION Sleep Study Type: NPSG Indication for sleep study: Snoring, Witnesses Apnea / Gasping During Sleep Epworth Sleepiness Score: 2  SLEEP STUDY TECHNIQUE As per the AASM Manual for the Scoring of Sleep and Associated Events v2.3 (April 2016) with a hypopnea requiring 4% desaturations.  The channels recorded and monitored were frontal, central and occipital EEG, electrooculogram (EOG), submentalis EMG (chin), nasal and oral airflow, thoracic and abdominal wall motion, anterior tibialis EMG, snore microphone, electrocardiogram, and pulse oximetry.  MEDICATIONS Medications self-administered by patient taken the night of the study : none reported  SLEEP ARCHITECTURE The study was initiated at 9:46:37 PM and ended at 4:57:28 AM.  Sleep onset time was 28.2 minutes and the sleep efficiency was 86.9%. The total sleep time was 374.6 minutes.  Stage REM latency was 235.5 minutes.  The patient spent 4.3% of the night in stage N1 sleep, 82.1% in stage N2 sleep, 0.0% in stage N3 and 13.6% in REM.  Alpha intrusion was absent.  Supine sleep was 39.59%.  RESPIRATORY PARAMETERS The overall apnea/hypopnea index (AHI) was 1.1 per hour. There were 7 total apneas, including 0 obstructive, 7 central and 0 mixed apneas. There were 0 hypopneas and 0 RERAs.  The AHI during Stage REM sleep was 0.0 per hour.  AHI while supine was 0.8 per hour.  The mean oxygen saturation was 94.3%. The minimum SpO2 during sleep was 92.0%.  loud snoring was noted during this study.  CARDIAC DATA The 2 lead EKG demonstrated sinus rhythm. The mean heart rate was 55.0 beats per minute. Other EKG findings  include: None.  LEG MOVEMENT DATA The total PLMS were 0 with a resulting PLMS index of 0.0. Associated arousal with leg movement index was 0.0 .  IMPRESSIONS - No significant obstructive sleep apnea occurred during this study (AHI = 1.1/h). - No significant central sleep apnea occurred during this study (CAI = 1.1/h). - The patient had minimal or no oxygen desaturation during the study (Min O2 = 92.0%) - The patient snored with loud snoring volume. - No cardiac abnormalities were noted during this study. - Clinically significant periodic limb movements did not occur during sleep. No significant associated arousals.  DIAGNOSIS - Primary Snoring  RECOMMENDATIONS - Manage for snoring and symptoms based on clinical judgment. - Sleep hygiene should be reviewed to assess factors that may improve sleep quality. - Weight management and regular exercise should be initiated or continued if appropriate.  [Electronically signed] 07/31/2023 11:20 AM  Jetty Duhamel MD, ABSM Diplomate, American Board of Sleep Medicine NPI: [id number]                         Jetty Duhamel Diplomate, American Board of Sleep Medicine  ELECTRONICALLY SIGNED ON:  07/31/2023, 11:17 AM [facility] PH: [phone]   FX: [phone] ACCREDITED BY THE AMERICAN ACADEMY OF SLEEP MEDICINE

## 2023-08-01 ENCOUNTER — Encounter: Payer: Self-pay | Admitting: Internal Medicine

## 2023-08-01 ENCOUNTER — Other Ambulatory Visit: Payer: Self-pay | Admitting: Internal Medicine

## 2023-08-01 DIAGNOSIS — Z0100 Encounter for examination of eyes and vision without abnormal findings: Secondary | ICD-10-CM

## 2023-08-03 NOTE — Progress Notes (Addendum)
 [facility] MD Outpatient Progress Note  08/05/2023 5:50 PM Shawn Brooks  MRN:  [id number]  Assessment:  Shawn Brooks presents for follow-up evaluation in-person. Today, patient reports worsening depressive symptoms and a decline in memory. Currently working under the presumption that memory problems related to both cannabis use and insufficiently treated MDD. I again advised patient to reduce cannabis use as this was likely affecting his mental health. He was amenable to decreasing cannabis use to every other day. In order to reduce other possible etiologies to memory problems, will continue to reduce trazodone  to 200 mg with plan to entirely come off it.  Has a replacement treatment for his insomnia as well as ongoing depressive symptoms, mirtazapine  was started as patient has not ever been on this agent before as well as aiding with his poor sleep, poor appetite, and depression.  He will most strongly benefit from psychotherapy as this is not a modality that has been well explored for addressing his major depressive disorder.  He also reports symptoms of restless leg syndrome which he reports are chronic but also could be secondary to anemia.  Encouraged patient to take iron supplementation every other day.  Identifying Information: Shawn Brooks is a 48 y.o. male with historical diagnosis of MDD, social anxiety disorder, HIV on HAART threapy, HLD, and Vitamin D  deficiency who is an established patient with [facility] for medication management. He reports progressively worsening memory problems and chronically struggles with insomnia. Memory problems currently hypothesized secondary to both insufficient treatment of MDD and possibly cannabis use although uncertain if more is contributing as his memory problems do not correlate with worsening depression. Sleep study did not show signs of OSA. Diagnosis of MDD made based on patient's depressed mood, anhedonia, poor sleep, poor appetite,  inattention. MOCA performed 06/02/23 was 27/30.   Plan:  # Major depressive disorder-recurrent episode, severe with psychotic features Past medication trials: Zoloft  Status of problem: stable Interventions: -- Continue Prozac  60 mg daily -- Start mirtazapine  7.5 mg nightly -- Recommended psychotherapy  #Social Anxiety Disorder Unclear etiology. 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 -Restart propranolol  10 mg twice daily   # Insomnia Past medication trials:  Status of problem: stable Interventions: -- Decrease trazodone  to 200 mg nightly   # EtOH use disorder, in early remission Past medication trials: none Status of problem: improving Interventions: -- Decreased to 2 beers per month (down from daily use) -- Psychoeducation provided regarding risks of dependency, tolerance, and withdrawal as well as safe limits of use; patient expresses intent to continue to reduce use   # Metabolic monitoring Interventions: -- SGA: 10/07/22: lipid profile LDL Cholesterol 173 (followed by PCP); Hgb A1c of 5.6>5.9 (10/07/22).   # Memory problems -Unclear etiology but differential includes pseudodementia 2/2 MDD, wernicke's encepahlopathy versus HIV dementia versus iatrogenic   Folate deficiency Vitamin D  deficiency -Vitamin D  supplement -Multivitamin with folate  Patient was given contact information for behavioral health clinic and was instructed to call 911 for emergencies.   Subjective:  Chief Complaint:  Chief Complaint  Patient presents with   Medication Management    Interval History:  Patient presents as a follow-up in person.  He reports worsening symptoms of depression including anhedonia, depressed mood, crying spells, and worsening sleep, and poor appetite.  While some of these symptoms are chronic including his reported memory problems, he does endorse increased crying spells for unknown reasons.  He denies any acute stressors recently which was resulted in worsening  depression.  He expresses concern that his memory problems have also progressively worsened stating that he is unable to recall how to get around [place] anymore when he used to be very familiar with it.  He states that he will speak with PCP about these memory problems as he states they do not appear to be purely psychiatric given they do not correlate with MDD symptoms or GAD symptoms.  He continues to use cannabis approximately 1-2 blunts per day.  He denies HI/AH but does endorse passive SI as well as occasional VH when he is driving stating that he sees people on the sidewalk that he does not suspect are there.  He states that he does not feel this is related to his vision problems and not seeing a optometrist/ophthalmologist for a long time.  Visit Diagnosis:    ICD-10-CM   1. 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 mirtazapine  (REMERON ) 7.5 MG tablet    FLUoxetine  (PROZAC ) 20 MG capsule    traZODone  (DESYREL ) 50 MG tablet    propranolol  (INDERAL ) 10 MG tablet    2. Restless leg syndrome  G25.81 ferrous sulfate  325 (65 FE) MG tablet    3. Social anxiety disorder  F40.10           Past Psychiatric History:  Diagnoses: Historical diagnosis of bipolar 1 disorder Medication trials: Zoloft  Substance use:             -- Etoh: Used to have 6 beers daily; denies history of withdrawal; now on 1 beer every ~2 weeks             -- Cannabis: Daily multiple blunts             -- Denies use of illicit drugs  Past Medical History:  Past Medical History:  Diagnosis Date   Anal condyloma    Anxiety    Blood dyscrasia    HIV   Chronic back pain    Constipation    takes miralax  daily   Depression    on meds   DJD (degenerative joint disease)    Gastric ulcer    GERD (gastroesophageal reflux disease)    Headache(784.0)    Hiatal hernia    HIV infection (HCC)    on meds   Hyperplastic colon polyp    Hypertension    on meds    Internal hemorrhoids    Sickle cell anemia (HCC)     Past Surgical History:  Procedure Laterality Date   COLONOSCOPY  2019   JMP-MAC-prep good-polyps+   ESOPHAGOGASTRODUODENOSCOPY  03/27/2012   Procedure: ESOPHAGOGASTRODUODENOSCOPY (EGD);  Surgeon: Jerrell KYM Sol, MD;  Location: [facility];  Service: Endoscopy;  Laterality: N/A;   INCISION AND DRAINAGE PERIRECTAL ABSCESS N/A 06/25/2023   Procedure: IRRIGATION AND DEBRIDEMENT PERIRECTAL ABSCESS;  Surgeon: Paola Dreama SAILOR, MD;  Location: [facility];  Service: General;  Laterality: N/A;   IRRIGATION AND DEBRIDEMENT ABSCESS N/A 03/13/2015   Procedure: IRRIGATION AND DEBRIDEMENT ABSCESS;  Surgeon: Donnice Lunger, MD;  Location: [facility];  Service: General;  Laterality: N/A;   WISDOM TOOTH EXTRACTION      Family History:  Family History  Problem Relation Age of Onset   Esophageal cancer Mother 30       smoker/used ETOH   Pneumonia Father    Diabetes Father    Hypertension Sister    Crohn's disease Brother    Crohn's disease Maternal Grandmother    Cancer Maternal Grandmother  patient unsure of type   Colon cancer Maternal Grandfather    Colon polyps Neg Hx    Rectal cancer Neg Hx    Stomach cancer Neg Hx     Social History:  Social History   Socioeconomic History   Marital status: Divorced    Spouse name: Not on file   Number of children: 1   Years of education: Not on file   Highest education level: Not on file  Occupational History   Occupation: Disabled  Tobacco Use   Smoking status: Every Day    Current packs/day: 0.50    Average packs/day: 0.5 packs/day for 34.1 years (17.1 ttl pk-yrs)    Types: Cigarettes    Start date: 34   Smokeless tobacco: Never   Tobacco comments:    not ready to quit.  Vaping Use   Vaping status: Never Used  Substance and Sexual Activity   Alcohol use: Yes    Comment: socially   Drug use: Yes    Frequency: 7.0 times per week    Types: Marijuana   Sexual activity: Not  Currently    Partners: Male    Comment: declined condoms  Other Topics Concern   Not on file  Social History Narrative   Not on file   Social Drivers of Health   Financial Resource Strain: Not on file  Food Insecurity: No Food Insecurity (06/24/2023)   Hunger Vital Sign    Worried About Running Out of Food in the Last Year: Never true    Ran Out of Food in the Last Year: Never true  Transportation Needs: No Transportation Needs (06/24/2023)   PRAPARE - Administrator, Civil Service (Medical): No    Lack of Transportation (Non-Medical): No  Physical Activity: Not on file  Stress: Not on file  Social Connections: Not on file    Allergies:  Allergies  Allergen Reactions   Chantix  [Varenicline ] Other (See Comments)    Bad dreams   Oxycodone  Itching    Current Medications: Current Outpatient Medications  Medication Sig Dispense Refill   ferrous sulfate  325 (65 FE) MG tablet Take 1 tablet (325 mg total) by mouth every other day.     mirtazapine  (REMERON ) 7.5 MG tablet Take 1 tablet (7.5 mg total) by mouth at bedtime. 30 tablet 0   propranolol  (INDERAL ) 10 MG tablet Take 1 tablet (10 mg total) by mouth 2 (two) times daily as needed (anxiety). 60 tablet 0   albuterol  (VENTOLIN  HFA) 108 (90 Base) MCG/ACT inhaler Inhale 2 puffs into the lungs every 6 (six) hours as needed for wheezing or shortness of breath. 18 g 1   AMBULATORY NON FORMULARY MEDICATION Medication Name: Diltiazem  2% gel:Lidocaine  5% - using your index finger, you should apply a small amount of medication inside the rectum up to your first knuckle/joint three times daily x 6-8 weeks. (Patient not taking: Reported on 06/24/2023) 45 g 1   darunavir  (PREZISTA ) 800 MG tablet TAKE 1 TABLET(800 MG) BY MOUTH DAILY 30 tablet 5   elvitegravir-cobicistat-emtricitabine -tenofovir  (GENVOYA ) 150-150-200-10 MG TABS tablet TAKE 1 TABLET BY MOUTH DAILY 30 tablet 5   FLUoxetine  (PROZAC ) 20 MG capsule Take 3 capsules (60 mg  total) by mouth daily. 90 capsule 2   polyethylene glycol (MIRALAX  / GLYCOLAX ) 17 g packet Take 17 g by mouth daily.     rosuvastatin  (CRESTOR ) 5 MG tablet Take 5 mg by mouth daily.     traZODone  (DESYREL ) 50 MG tablet Take 4 tablets (200  mg total) by mouth at bedtime. 120 tablet 0   valACYclovir  (VALTREX ) 500 MG tablet TAKE 1 TABLET BY MOUTH TWICE DAILY (Patient taking differently: Take 500 mg by mouth daily as needed (outbreaks).) 14 tablet 5   Vitamin D , Ergocalciferol , (DRISDOL ) 1.25 MG (50000 UNIT) CAPS capsule TAKE 1 CAPSULE BY MOUTH EVERY 7 DAYS 12 capsule 1   No current facility-administered medications for this visit.    ROS: Review of Systems  Objective:  Psychiatric Specialty Exam: Blood pressure 116/84, pulse 76.There is no height or weight on file to calculate BMI.  General Appearance: Casual  Eye Contact:  Good  Speech:  Clear and Coherent and Normal Rate  Volume:  Normal  Mood:  Depressed  Affect:  Depressed and Tearful  Thought Process:  Coherent, Goal Directed, and Linear  Orientation:  Full (Time, Place, and Person)  Thought Content: Logical   Suicidal Thoughts:  No  Homicidal Thoughts:  No  Memory:  Remote;   Good  Judgment:  Fair  Insight:  Fair  Psychomotor Activity:  Normal  Concentration:  Concentration: Good and Attention Span: Good              Assets:  Communication Skills Desire for Improvement Financial Resources/Insurance Housing Intimacy Leisure Time Physical Health Resilience Social Support Talents/Skills Transportation Vocational/Educational  ADL's:  Intact  Cognition: WNL  Sleep:  Good   PE: General: well-appearing; no acute distress  Pulm: no increased work of breathing on room air  Strength & Muscle Tone: within normal limits Neuro: no focal neurological deficits observed  Gait & Station: normal  Metabolic Disorder Labs: Lab Results  Component Value Date   HGBA1C 5.7 04/08/2023   MPG 117 (H) 11/16/2013   No results  found for: PROLACTIN Lab Results  Component Value Date   CHOL 200 (H) 04/08/2023   TRIG 130 04/08/2023   HDL 53 04/08/2023   CHOLHDL 3.8 04/08/2023   VLDL 28 05/12/2016   LDLCALC 124 (H) 04/08/2023   LDLCALC 173 (H) 10/07/2022   Lab Results  Component Value Date   TSH 2.370 03/18/2023   TSH 1.150 09/01/2017    Therapeutic Level Labs: No results found for: LITHIUM No results found for: VALPROATE No results found for: CBMZ  Screenings: GAD-7    Flowsheet Row Office Visit from 04/08/2023 in [facility] [place] - A Dept Of [place]. [facility] Office Visit from 10/07/2022 in [facility] [place] - A Dept Of [place]. [facility] Video Visit from 03/18/2022 in [facility] Video Visit from 11/04/2021 in [facility] Office Visit from 10/13/2021 in [facility] at [facility]  Total GAD-7 Score 15 0 16 11 6       PHQ2-9    Flowsheet Row Office Visit from 04/08/2023 in [facility] [place] - A Dept Of [place]. [facility] Office Visit from 03/10/2023 in [facility] - A Dept Of [place]. [facility] Office Visit from 10/07/2022 in [facility] [place] - A Dept Of [place]. [facility] Office Visit from 09/07/2022 in [facility] - A Dept Of [place]. [facility] Video Visit from 03/18/2022 in [facility]  PHQ-2 Total Score 5 0 0 0 5  PHQ-9 Total Score 13 -- 0 -- 20      Flowsheet Row ED to Hosp-Admission (Discharged)  from 06/23/2023 in [facility]  SURGICAL Video Visit from 03/18/2022 in [facility] Video Visit from 11/04/2021 in [facility]  C-SSRS RISK CATEGORY No Risk No Risk No Risk       Collaboration of Care: Collaboration of Care:   Patient/Guardian was  advised Release of Information must be obtained prior to any record release in order to collaborate their care with an outside provider. Patient/Guardian was advised if they have not already done so to contact the registration department to sign all necessary forms in order for us  to release information regarding their care.   Consent: Patient/Guardian gives verbal consent for treatment and assignment of benefits for services provided during this visit. Patient/Guardian expressed understanding and agreed to proceed.   A total of 30 minutes was spent involved in face to face clinical care, chart review, and documentation.   Prentice Espy, MD 08/05/2023, 5:50 PM

## 2023-08-04 ENCOUNTER — Ambulatory Visit (HOSPITAL_COMMUNITY): Payer: MEDICAID | Admitting: Student

## 2023-08-04 ENCOUNTER — Encounter (HOSPITAL_COMMUNITY): Payer: Self-pay | Admitting: Student

## 2023-08-04 VITALS — BP 116/84 | HR 76

## 2023-08-04 DIAGNOSIS — F333 Major depressive disorder, recurrent, severe with psychotic symptoms: Secondary | ICD-10-CM

## 2023-08-04 DIAGNOSIS — F4010 Social phobia, unspecified: Secondary | ICD-10-CM

## 2023-08-04 DIAGNOSIS — G2581 Restless legs syndrome: Secondary | ICD-10-CM

## 2023-08-04 MED ORDER — FLUOXETINE HCL 20 MG PO CAPS: 60.0000 mg | ORAL_CAPSULE | Freq: Every day | ORAL | 2 refills | Status: DC

## 2023-08-04 MED ORDER — FERROUS SULFATE 325 (65 FE) MG PO TABS: 325.0000 mg | ORAL_TABLET | ORAL | Status: DC

## 2023-08-04 MED ORDER — MIRTAZAPINE 7.5 MG PO TABS: 7.5000 mg | ORAL_TABLET | Freq: Every day | ORAL | 0 refills | Status: DC

## 2023-08-04 MED ORDER — PROPRANOLOL HCL 10 MG PO TABS: 10.0000 mg | ORAL_TABLET | Freq: Two times a day (BID) | ORAL | 0 refills | Status: DC | PRN

## 2023-08-04 MED ORDER — TRAZODONE HCL 50 MG PO TABS: 200.0000 mg | ORAL_TABLET | Freq: Every day | ORAL | 0 refills | Status: DC

## 2023-08-05 ENCOUNTER — Encounter (HOSPITAL_COMMUNITY): Payer: Self-pay | Admitting: Student

## 2023-08-30 ENCOUNTER — Encounter: Payer: Self-pay | Admitting: Internal Medicine

## 2023-08-30 ENCOUNTER — Ambulatory Visit: Payer: MEDICAID | Attending: Internal Medicine | Admitting: Internal Medicine

## 2023-08-30 VITALS — BP 112/73 | HR 83 | Temp 98.3°F | Ht 68.0 in | Wt 232.0 lb

## 2023-08-30 DIAGNOSIS — R0609 Other forms of dyspnea: Secondary | ICD-10-CM | POA: Diagnosis not present

## 2023-08-30 DIAGNOSIS — Z23 Encounter for immunization: Secondary | ICD-10-CM

## 2023-08-30 DIAGNOSIS — Z139 Encounter for screening, unspecified: Secondary | ICD-10-CM

## 2023-08-30 DIAGNOSIS — Z6835 Body mass index (BMI) 35.0-35.9, adult: Secondary | ICD-10-CM

## 2023-08-30 DIAGNOSIS — D649 Anemia, unspecified: Secondary | ICD-10-CM

## 2023-08-30 DIAGNOSIS — F17210 Nicotine dependence, cigarettes, uncomplicated: Secondary | ICD-10-CM

## 2023-08-30 DIAGNOSIS — E66812 Obesity, class 2: Secondary | ICD-10-CM

## 2023-08-30 DIAGNOSIS — E782 Mixed hyperlipidemia: Secondary | ICD-10-CM

## 2023-08-30 DIAGNOSIS — R42 Dizziness and giddiness: Secondary | ICD-10-CM | POA: Diagnosis not present

## 2023-08-30 DIAGNOSIS — N5319 Other ejaculatory dysfunction: Secondary | ICD-10-CM

## 2023-08-30 DIAGNOSIS — F17200 Nicotine dependence, unspecified, uncomplicated: Secondary | ICD-10-CM

## 2023-08-30 MED ORDER — NICOTINE 21 MG/24HR TD PT24: 21.0000 mg | MEDICATED_PATCH | Freq: Every day | TRANSDERMAL | 1 refills | Status: DC

## 2023-08-30 MED ORDER — ALBUTEROL SULFATE HFA 108 (90 BASE) MCG/ACT IN AERS: 2.0000 | INHALATION_SPRAY | Freq: Four times a day (QID) | RESPIRATORY_TRACT | 1 refills | Status: DC | PRN

## 2023-08-30 NOTE — Progress Notes (Signed)
 Patient ID: Shawn Brooks, male    DOB: January 15, 1976  MRN: [id number]  CC: Shortness of Breath (SOB, dizziness, fatigue X4-5 mo/Yes to shingles vax)   Subjective: Shawn Brooks is a 48 y.o. male who presents for chronic ds management. His concerns today include:  HIV, DJD,  hyperlipidemia, anxiety, bipolar 1  (followed by psychiatry ), tobacco dependence, vit D deficiency, numbness in hands (neg EMG 2019), chronic bilateral lower back pain , rectal fissure, internal hemorrhoids, PreDM.   Discussed the use of AI scribe software for clinical note transcription with the patient, who gave verbal consent to proceed.  History of Present Illness   The patient, with a history of HL, HIV, prediabetes/obesity, and depression, presents with shortness of breath, dizziness, and fatigue for the past four to five months. He reports shortness of breath with exertion, such as going up and down Brooks or dressing, and occasional shortness of breath at rest.  He reports feeling very short of breath when he had sexual intercourse recently.  He also experiences chest pain on the right side, but not consistently with shortness of breath. He denies recent long-distance travel or leg swelling.  He denies any cough or wheezing.  He is requesting refill on albuterol inhaler which she has had in the past which works like it was prescribed for diagnosis of acute bronchitis.  He feels that this may help with the shortness of breath. - dizziness occurs several times a day, lasting about 20-30 seconds, primarily when he goes to sit or stand up. He denies dizziness with changes in head movement or when rolling over in bed. He has been adhering to adequate fluid intake, consuming about five to six bottles of water daily. -Over the past four to five months, he started 2 new medications through his psychiatrist - mirtazapine and propranolol (as needed), the latter of which was restarted a month ago after a period of non-use.   -noted to have mild anemia on last CBC 05/2023 during a hospitalization for a rectal abscess.   Obesity/prediabetes: He has gained some weight since last visit with me.   He admits to not avoiding sugary foods and white carbs.  Compliant with Crestor for his cholesterol.  Tobacco dependence: currently smokes about half to three-quarters of a pack a day and has been considering quitting.  Reports Chantix did not work for him in the past.  Bipolar/MDD/anxiety: In terms of his mental health, he reports some improvement with his current medications but still experiences crying, severe memory loss, and negative thoughts.  He is plugged in with behavioral health.  He masturbates at times.  Reports minimal ejaculation than before.    HIV: Last levels checked in September 2024 were undetectable.  Reports compliance with taking antiviral Genvoya.  Sees Dr. Luciana Brooks. Patient Active Problem List   Diagnosis Date Noted   Perianal abscess 06/24/2023   Acute urinary retention 06/24/2023   AKI (acute kidney injury) (HCC) 06/24/2023   Anal fissure 06/17/2023   Folate deficiency 04/08/2023   Obstructive sleep apnea 03/10/2023   Social anxiety disorder 02/24/2023   Class 2 severe obesity due to excess calories with serious comorbidity and body mass index (BMI) of 35.0 to 35.9 in adult [facility]) 10/07/2022   Influenza vaccine refused 06/17/2020   Esophageal dysphagia 06/17/2020   Mixed hyperlipidemia 06/17/2020   Obesity, Class I, BMI 30-34.9 06/17/2020   History of farsightedness 06/17/2020   Chronic night sweats 04/10/2020   Counseled about COVID-19 virus infection 09/18/2019  Urinary frequency 12/26/2018   Medication monitoring encounter 08/08/2018   Memory problem 09/01/2017   Vitamin D deficiency 09/01/2017   Primary osteoarthritis of left knee 05/02/2017   Prediabetes 12/22/2016   Tobacco abuse 05/31/2016   Lumbar transverse process fracture (HCC) 10/02/2015   Screening examination for sexually  transmitted disease 04/04/2014   Thoracic or lumbosacral neuritis or radiculitis 01/15/2013   H/O gastrointestinal disease 01/15/2013   Lumbar radiculopathy 01/15/2013   Carcinoma in situ of anal canal 11/17/2012   Condyloma acuminatum 11/17/2012   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   AGW (anogenital warts) 11/17/2012   Hemorrhoid 09/27/2012   BRBPR (bright red blood per rectum) 07/27/2012   Prostatitis 04/12/2012   Constipation 03/09/2012   Callus 08/16/2011   Ingrown toenail 08/16/2011   Chronic low back pain 08/16/2011   Dyslipidemia 09/03/2010   Herpes simplex virus (HSV) infection 07/06/2010   Erectile dysfunction 07/06/2010   BURSITIS, KNEE 05/06/2010   Human immunodeficiency virus (HIV) disease (HCC) 04/16/2010     Current Outpatient Medications on File Prior to Visit  Medication Sig Dispense Refill   albuterol (VENTOLIN HFA) 108 (90 Base) MCG/ACT inhaler Inhale 2 puffs into the lungs every 6 (six) hours as needed for wheezing or shortness of breath. 18 g 1   AMBULATORY NON FORMULARY MEDICATION Medication Name: Diltiazem 2% [id number] 5% - using your index finger, you should apply a small amount of medication inside the rectum up to your first knuckle/joint three times daily x 6-8 weeks. 45 g 1   darunavir (PREZISTA) 800 MG tablet TAKE 1 TABLET(800 MG) BY MOUTH DAILY 30 tablet 5   elvitegravir-cobicistat-emtricitabine-tenofovir (GENVOYA) 150-150-200-10 MG TABS tablet TAKE 1 TABLET BY MOUTH DAILY 30 tablet 5   ferrous sulfate 325 (65 FE) MG tablet Take 1 tablet (325 mg total) by mouth every other day.     FLUoxetine (PROZAC) 20 MG capsule Take 3 capsules (60 mg total) by mouth daily. 90 capsule 2   mirtazapine (REMERON) 7.5 MG tablet Take 1 tablet (7.5 mg total) by mouth at bedtime. 30 tablet 0   propranolol (INDERAL) 10 MG tablet Take 1 tablet (10 mg total) by mouth 2 (two) times daily as needed (anxiety). 60 tablet 0   rosuvastatin (CRESTOR) 5 MG  tablet Take 5 mg by mouth daily.     traZODone (DESYREL) 50 MG tablet Take 4 tablets (200 mg total) by mouth at bedtime. 120 tablet 0   valACYclovir (VALTREX) 500 MG tablet TAKE 1 TABLET BY MOUTH TWICE DAILY (Patient taking differently: Take 500 mg by mouth daily as needed (outbreaks).) 14 tablet 5   Vitamin D, Ergocalciferol, (DRISDOL) 1.25 MG (50000 UNIT) CAPS capsule TAKE 1 CAPSULE BY MOUTH EVERY 7 DAYS 12 capsule 1   polyethylene glycol (MIRALAX / GLYCOLAX) 17 g packet Take 17 g by mouth daily.     No current facility-administered medications on file prior to visit.    Allergies  Allergen Reactions   Chantix [Varenicline] Other (See Comments)    Bad dreams   Oxycodone Itching    Social History   Socioeconomic History   Marital status: Divorced    Spouse name: Not on file   Number of children: 1   Years of education: Not on file   Highest education level: Not on file  Occupational History   Occupation: Disabled  Tobacco Use   Smoking status: Every Day    Current packs/day: 0.50    Average packs/day: 0.5 packs/day for 34.2 years (17.1 ttl  pk-yrs)    Types: Cigarettes    Start date: 76   Smokeless tobacco: Never   Tobacco comments:    not ready to quit.  Vaping Use   Vaping status: Never Used  Substance and Sexual Activity   Alcohol use: Yes    Comment: socially   Drug use: Yes    Frequency: 7.0 times per week    Types: Marijuana   Sexual activity: Not Currently    Partners: Male    Comment: declined condoms  Other Topics Concern   Not on file  Social History Narrative   Not on file   Social Drivers of Health   Financial Resource Strain: High Risk (08/30/2023)   Overall Financial Resource Strain (CARDIA)    Difficulty of Paying Living Expenses: Very hard  Food Insecurity: Food Insecurity Present (08/30/2023)   Hunger Vital Sign    Worried About Running Out of Food in the Last Year: Sometimes true    Ran Out of Food in the Last Year: Sometimes true   Transportation Needs: No Transportation Needs (08/30/2023)   PRAPARE - Administrator, Civil Service (Medical): No    Lack of Transportation (Non-Medical): No  Physical Activity: Inactive (08/30/2023)   Exercise Vital Sign    Days of Exercise per Week: 0 days    Minutes of Exercise per Session: 0 min  Stress: Stress Concern Present (08/30/2023)   [employer] of Occupational Health - Occupational Stress Questionnaire    Feeling of Stress : Very much  Social Connections: Socially Isolated (08/30/2023)   Social Connection and Isolation Panel [NHANES]    Frequency of Communication with Friends and Family: More than three times a week    Frequency of Social Gatherings with Friends and Family: Never    Attends Religious Services: Never    Database administrator or Organizations: No    Attends Banker Meetings: Never    Marital Status: Divorced  Catering manager Violence: Not At Risk (08/30/2023)   Humiliation, Afraid, Rape, and Kick questionnaire    Fear of Current or Ex-Partner: No    Emotionally Abused: No    Physically Abused: No    Sexually Abused: No    Family History  Problem Relation Age of Onset   Esophageal cancer Mother 19       smoker/used ETOH   Pneumonia Father    Diabetes Father    Hypertension Sister    Crohn's disease Brother    Crohn's disease Maternal Grandmother    Cancer Maternal Grandmother        patient unsure of type   Colon cancer Maternal Grandfather    Colon polyps Neg Hx    Rectal cancer Neg Hx    Stomach cancer Neg Hx     Past Surgical History:  Procedure Laterality Date   COLONOSCOPY  2019   JMP-MAC-prep good-polyps+   ESOPHAGOGASTRODUODENOSCOPY  03/27/2012   Procedure: ESOPHAGOGASTRODUODENOSCOPY (EGD);  Surgeon: Shirley Friar, MD;  Location: [facility];  Service: Endoscopy;  Laterality: N/A;   INCISION AND DRAINAGE PERIRECTAL ABSCESS N/A 06/25/2023   Procedure: IRRIGATION AND DEBRIDEMENT PERIRECTAL ABSCESS;   Surgeon: Diamantina Monks, MD;  Location: [facility];  Service: General;  Laterality: N/A;   IRRIGATION AND DEBRIDEMENT ABSCESS N/A 03/13/2015   Procedure: IRRIGATION AND DEBRIDEMENT ABSCESS;  Surgeon: Luretha Murphy, MD;  Location: [facility];  Service: General;  Laterality: N/A;   WISDOM TOOTH EXTRACTION      ROS: Review of Systems Negative except  as stated above  PHYSICAL EXAM: BP 112/73 (BP Location: Left Arm, Patient Position: Standing, Cuff Size: Large)   Pulse 83   Temp 98.3 F (36.8 C) (Oral)   Ht 5\' 8"  (1.727 m)   Wt 232 lb (105.2 kg)   SpO2 99%   BMI 35.28 kg/m   Wt Readings from Last 3 Encounters:  08/30/23 232 lb (105.2 kg)  07/21/23 223 lb (101.2 kg)  07/14/23 215 lb (97.5 kg)    Physical Exam   General appearance - alert, well appearing, and in no distress Mental status - normal mood, behavior, speech, dress, motor activity, and thought processes Eyes -Pink conjunctiva Mouth -oral mucosa is moist. Chest - clear to auscultation, no wheezes, rales or rhonchi, symmetric air entry Heart - normal rate, regular rhythm, normal S1, S2, no murmurs, rubs, clicks or gallops Neurological - cranial nerves II through XII intact.  Gait is stable.  Power 5/5 throughout. Extremities -no lower extremity edema.     Latest Ref Rng & Units 06/27/2023    5:59 AM 06/25/2023    6:04 AM 06/24/2023   12:11 PM  CMP  Glucose 70 - 99 mg/dL 98  99  88   BUN 6 - 20 mg/dL 13  10  12    Creatinine 0.61 - 1.24 mg/dL 1.61  0.96  0.45   Sodium 135 - 145 mmol/L 137  137  134   Potassium 3.5 - 5.1 mmol/L 3.7  3.7  3.3   Chloride 98 - 111 mmol/L 104  103  97   CO2 22 - 32 mmol/L 25  26  25    Calcium 8.9 - 10.3 mg/dL 8.2  8.6  8.8    Lipid Panel     Component Value Date/Time   CHOL 200 (H) 04/08/2023 0914   TRIG 130 04/08/2023 0914   HDL 53 04/08/2023 0914   CHOLHDL 3.8 04/08/2023 0914   CHOLHDL 4.7 08/09/2019 0922   VLDL 28 05/12/2016 0955   LDLCALC 124 (H) 04/08/2023 0914   LDLCALC   08/09/2019 0922     Comment:     . LDL cholesterol not calculated. Triglyceride levels greater than 400 mg/dL invalidate calculated LDL results. . Reference range: <100 . Desirable range <100 mg/dL for primary prevention;   <70 mg/dL for patients with CHD or diabetic patients  with > or = 2 CHD risk factors. Marland Kitchen LDL-C is now calculated using the Martin-Hopkins  calculation, which is a validated novel method providing  better accuracy than the Friedewald equation in the  estimation of LDL-C.  Horald Pollen et al. Lenox Ahr. 4098;119(14): 2061-2068  (http://education.[facility].com/faq/FAQ164)     CBC    Component Value Date/Time   WBC 17.5 (H) 06/27/2023 0559   RBC 3.83 (L) 06/27/2023 0559   HGB 11.4 (L) 06/27/2023 0559   HGB CANCELED 03/18/2023 0000   HCT 33.6 (L) 06/27/2023 0559   HCT CANCELED 03/18/2023 0000   PLT 278 06/27/2023 0559   PLT CANCELED 03/18/2023 0000   MCV 87.7 06/27/2023 0559   MCV 92 10/13/2021 1538   MCH 29.8 06/27/2023 0559   MCHC 33.9 06/27/2023 0559   RDW 13.2 06/27/2023 0559   RDW 13.8 10/13/2021 1538   LYMPHSABS 2.1 06/24/2023 1211   LYMPHSABS CANCELED 03/18/2023 0000   MONOABS 1.2 (H) 06/24/2023 1211   EOSABS 0.0 06/24/2023 1211   EOSABS CANCELED 03/18/2023 0000   BASOSABS 0.0 06/24/2023 1211   BASOSABS CANCELED 03/18/2023 0000    ASSESSMENT AND PLAN: 1. DOE (dyspnea on  exertion) (Primary) Differential diagnosis include anemia versus deconditioning versus cardiac.  Patient is requesting albuterol inhaler but denies any cough or wheezing.  Will give a refill to see if it helps.  However we will also check CBC, iron studies and BNP.  If these studies are normal and no improvement with albuterol, will refer him to cardiology for further evaluation to rule out angina equivalent. - Brain natriuretic peptide - albuterol (VENTOLIN HFA) 108 (90 Base) MCG/ACT inhaler; Inhale 2 puffs into the lungs every 6 (six) hours as needed for wheezing or shortness of  breath.  Dispense: 18 g; Refill: 1  2. Dizziness Orthostatic blood pressure and pulse check negative for orthostatic change. Will recheck CBC today. 3. Normocytic anemia - CBC - Iron, TIBC and Ferritin Panel  4. Tobacco dependence Pt is current smoker. Patient advised to quit smoking. Discussed health risks associated with smoking including lung and other types of cancers, chronic lung diseases and CV risks.. Pt ready to give trail of quitting.   Discussed methods to help quit including quitting cold [place], use of NRT, Chantix Pt wanting to try: Patient willing to try nicotine patches.  Discussed stepdown approach.  Will start with the 21 mg patch. _3_ Minutes spent on counseling. F/U: Reassess progress on subsequent visit  5. Mixed hyperlipidemia Continue Crestor  6. Class 2 severe obesity due to excess calories with serious comorbidity and body mass index (BMI) of 35.0 to 35.9 in adult [facility]) Discussed and encourage healthy eating habits including eating healthier snacks  7. Ejaculatory disorder - Ambulatory referral to Urology  8. Need for shingles vaccine Given first Shingrix vaccine today. 9. Encounter for screening involving social determinants of health (SDoH)  - AMB Referral VBCI Care Management   Patient was given the opportunity to ask questions.  Patient verbalized understanding of the plan and was able to repeat key elements of the plan.   This documentation was completed using Paediatric nurse.  Any transcriptional errors are unintentional.  Orders Placed This Encounter  Procedures   AMB Referral VBCI Care Management     Requested Prescriptions    No prescriptions requested or ordered in this encounter    No follow-ups on file.  Jonah Blue, MD, FACP

## 2023-08-31 ENCOUNTER — Other Ambulatory Visit: Payer: Self-pay | Admitting: Internal Medicine

## 2023-08-31 ENCOUNTER — Telehealth: Payer: Self-pay | Admitting: *Deleted

## 2023-08-31 DIAGNOSIS — B20 Human immunodeficiency virus [HIV] disease: Secondary | ICD-10-CM

## 2023-08-31 NOTE — Progress Notes (Signed)
 Complex Care Management Note  Care Guide Note 08/31/2023 Name: Shawn Brooks MRN: [id number] DOB: Mar 07, 1976  Shawn Brooks is a 48 y.o. year old male who sees Marcine Matar, MD for primary care. I reached out to Bartlomiej Herminio Commons by phone today to offer complex care management services.  [name] was given information about Complex Care Management services today including:   The Complex Care Management services include support from the care team which includes your Nurse Care Manager, Clinical Social Worker, or Pharmacist.  The Complex Care Management team is here to help remove barriers to the health concerns and goals most important to you. Complex Care Management services are voluntary, and the patient may decline or stop services at any time by request to their care team member.   Complex Care Management Consent Status: Patient agreed to services and verbal consent obtained.   Follow up plan:  Telephone appointment with complex care management team member scheduled for:  3/12  Encounter Outcome:  Patient Scheduled  Gwenevere Ghazi  [facility]  [facility], [facility] Guide  Direct Dial: [phone]  Fax [phone]

## 2023-09-01 ENCOUNTER — Telehealth (HOSPITAL_COMMUNITY): Payer: Self-pay

## 2023-09-01 DIAGNOSIS — F333 Major depressive disorder, recurrent, severe with psychotic symptoms: Secondary | ICD-10-CM

## 2023-09-01 LAB — CBC
Hematocrit: 44.6 % (ref 37.5–51.0)
Hemoglobin: 14.6 g/dL (ref 13.0–17.7)
MCH: 30.2 pg (ref 26.6–33.0)
MCHC: 32.7 g/dL (ref 31.5–35.7)
MCV: 92 fL (ref 79–97)
Platelets: 245 10*3/uL (ref 150–450)
RBC: 4.83 x10E6/uL (ref 4.14–5.80)
RDW: 13.8 % (ref 11.6–15.4)
WBC: 7.7 10*3/uL (ref 3.4–10.8)

## 2023-09-01 LAB — IRON,TIBC AND FERRITIN PANEL
Ferritin: 54 ng/mL (ref 30–400)
Iron Saturation: 22 % (ref 15–55)
Iron: 72 ug/dL (ref 38–169)
Total Iron Binding Capacity: 329 ug/dL (ref 30–450)
UIBC: 257 ug/dL (ref 111–343)

## 2023-09-01 LAB — BRAIN NATRIURETIC PEPTIDE: BNP: 30.1 pg/mL (ref 0.0–100.0)

## 2023-09-01 MED ORDER — MIRTAZAPINE 7.5 MG PO TABS: 7.5000 mg | ORAL_TABLET | Freq: Every day | ORAL | 0 refills | Status: DC

## 2023-09-01 MED ORDER — PROPRANOLOL HCL 10 MG PO TABS: 10.0000 mg | ORAL_TABLET | Freq: Two times a day (BID) | ORAL | 0 refills | Status: DC | PRN

## 2023-09-01 MED ORDER — FLUOXETINE HCL 20 MG PO CAPS: 60.0000 mg | ORAL_CAPSULE | Freq: Every day | ORAL | 2 refills | Status: DC

## 2023-09-01 MED ORDER — TRAZODONE HCL 50 MG PO TABS: 200.0000 mg | ORAL_TABLET | Freq: Every day | ORAL | 0 refills | Status: DC

## 2023-09-01 NOTE — Telephone Encounter (Addendum)
 Hello,     Pt is requesting for a refill of mirtazapine (REMERON) 7.5 MG tablets to be sent to pharmacy.   Pt is also requesting for a refill of  HYDROxyzine to be sent to pharmacy.

## 2023-09-01 NOTE — Telephone Encounter (Signed)
 Refills sent to preferred pharmacy.

## 2023-09-01 NOTE — Addendum Note (Signed)
 Addended by: Park Pope B on: 09/01/2023 08:24 AM   Modules accepted: Orders

## 2023-09-02 ENCOUNTER — Other Ambulatory Visit: Payer: Self-pay | Admitting: Internal Medicine

## 2023-09-02 ENCOUNTER — Encounter: Payer: Self-pay | Admitting: *Deleted

## 2023-09-02 DIAGNOSIS — R0609 Other forms of dyspnea: Secondary | ICD-10-CM

## 2023-09-06 ENCOUNTER — Encounter (HOSPITAL_COMMUNITY): Payer: MEDICAID | Admitting: Student

## 2023-09-07 ENCOUNTER — Ambulatory Visit: Payer: Self-pay

## 2023-09-07 NOTE — Patient Instructions (Signed)
 Visit Information  Thank you for taking time to visit with me today. Please don't hesitate to contact me if I can be of assistance to you.   Following are the goals we discussed today:  Patient to contact [facility] case manager by next week. Patient to search [email or web address] for housing options. Patient to apply for food stamps online.    If you are experiencing a Mental Health or Behavioral Health Crisis or need someone to talk to, please call 911  Patient verbalizes understanding of instructions and care plan provided today and agrees to view in MyChart. Active MyChart status and patient understanding of how to access instructions and care plan via MyChart confirmed with patient.     No further follow up required: Patient does not request a follow up visit and will be managed by [facility]  Lysle Morales, BSW [place]  [facility], [facility] Social Worker Direct Dial: [phone]  Fax: [phone] Website: [email or web address]

## 2023-09-07 NOTE — Patient Outreach (Signed)
 Care Coordination   Initial Visit Note   09/07/2023 Name: Shawn Brooks MRN: [id number] DOB: 1975-09-15  Shawn Brooks is a 48 y.o. year old male who sees Marcine Matar, MD for primary care. I spoke with  Shawn Brooks by phone today.  What matters to the patients health and wellness today?  Patient needs housing.    Goals Addressed             This Visit's Progress    Care Coordination Activities       Interventions Today    Flowsheet Row Most Recent Value  General Interventions   General Interventions Discussed/Reviewed General Interventions Discussed, General Interventions Reviewed, Community Resources  [Pt has connected with [employer] & completed enrollment with Tammy.Pt will contact Tammy next week if she has not called back.Pt living in partner's son home for over 31yr & needs housing.SW provided online housing search & referred to apply for foodstamps.]              SDOH assessments and interventions completed:  Yes  SDOH Interventions Today    Flowsheet Row Most Recent Value  SDOH Interventions   Food Insecurity Interventions Other (Comment)  [Plan to apply for foodstamps]  Housing Interventions Other (Comment)  [Living in temporary housing with partner son]  Transportation Interventions Intervention Not Indicated  [Has access to a vehicle]  Utilities Interventions Intervention Not Indicated        Care Coordination Interventions:  Yes, provided   Follow up plan: No further intervention required.   Encounter Outcome:  Patient Visit Completed

## 2023-09-13 ENCOUNTER — Ambulatory Visit: Payer: MEDICAID | Admitting: Internal Medicine

## 2023-09-27 ENCOUNTER — Ambulatory Visit (INDEPENDENT_AMBULATORY_CARE_PROVIDER_SITE_OTHER): Payer: MEDICAID | Admitting: Urology

## 2023-09-27 ENCOUNTER — Encounter: Payer: Self-pay | Admitting: Urology

## 2023-09-27 VITALS — BP 115/80 | HR 81 | Ht 68.0 in | Wt 232.0 lb

## 2023-09-27 DIAGNOSIS — N401 Enlarged prostate with lower urinary tract symptoms: Secondary | ICD-10-CM

## 2023-09-27 DIAGNOSIS — N529 Male erectile dysfunction, unspecified: Secondary | ICD-10-CM

## 2023-09-27 DIAGNOSIS — N5319 Other ejaculatory dysfunction: Secondary | ICD-10-CM | POA: Diagnosis not present

## 2023-09-27 DIAGNOSIS — Z125 Encounter for screening for malignant neoplasm of prostate: Secondary | ICD-10-CM

## 2023-09-27 LAB — URINALYSIS, ROUTINE W REFLEX MICROSCOPIC
Bilirubin, UA: NEGATIVE
Glucose, UA: NEGATIVE
Ketones, UA: NEGATIVE
Leukocytes,UA: NEGATIVE
Nitrite, UA: NEGATIVE
Protein,UA: NEGATIVE
RBC, UA: NEGATIVE
Specific Gravity, UA: 1.015 (ref 1.005–1.030)
Urobilinogen, Ur: 0.2 mg/dL (ref 0.2–1.0)
pH, UA: 6 (ref 5.0–7.5)

## 2023-09-27 LAB — BLADDER SCAN AMB NON-IMAGING

## 2023-09-27 MED ORDER — TADALAFIL 5 MG PO TABS: 5.0000 mg | ORAL_TABLET | Freq: Every day | ORAL | 11 refills | Status: DC

## 2023-09-27 NOTE — Progress Notes (Signed)
 Assessment: 1. Ejaculatory disorder   2. Organic impotence   3. BPH with lower urinary tract symptoms without urinary obstruction   4. Prostate cancer screening     Plan: I personally reviewed the patient's chart including provider notes, lab results. I discussed the possible causes of decreased ejaculatory volume with the patient today.  I advised him that generally this is not considered a symptom of any serious underlying problem.  He was on some medications that can affect ejaculation as well. Recommend a trial of tadalafil 5 mg daily for management of his ED and BPH with lower urinary tract symptoms.  Prescription sent. PSA today. Return to office in 1 month.  Chief Complaint:  Chief Complaint  Patient presents with   Ejaculatory disorder    History of Present Illness:  Shawn Brooks is a 48 y.o. male who is seen in consultation from Marcine Matar, MD for evaluation of decreased ejaculation, erectile dysfunction, and lower urinary tract symptoms. He has a history of erectile dysfunction with difficulty achieving and maintaining his erections.  He has previously used Cialis with some benefit.  He reports that he has not had sex in 2 years following an episode of symptoms associated with his orgasm.  Since that time, he has been masturbating.  He reports a decrease in the amount of ejaculation.  No pain with erection or ejaculation.  No curvature with erections.  He does report occasional a.m. erections.  No decrease in his libido.  He reports lower urinary tract symptoms including frequency, voiding every 60-90 minutes, urgency, nocturia x 3, and straining to void.  No dysuria or gross hematuria. IPSS = 25/5. No recent PSA results.  Past Medical History:  Past Medical History:  Diagnosis Date   Anal condyloma    Anxiety    Blood dyscrasia    HIV   Chronic back pain    Constipation    takes miralax daily   Depression    on meds   DJD (degenerative joint disease)     Gastric ulcer    GERD (gastroesophageal reflux disease)    Headache(784.0)    Hiatal hernia    HIV infection (HCC)    on meds   Hyperplastic colon polyp    Hypertension    on meds   Internal hemorrhoids    Sickle cell anemia (HCC)     Past Surgical History:  Past Surgical History:  Procedure Laterality Date   COLONOSCOPY  2019   JMP-MAC-prep good-polyps+   ESOPHAGOGASTRODUODENOSCOPY  03/27/2012   Procedure: ESOPHAGOGASTRODUODENOSCOPY (EGD);  Surgeon: Shirley Friar, MD;  Location: [facility];  Service: Endoscopy;  Laterality: N/A;   INCISION AND DRAINAGE PERIRECTAL ABSCESS N/A 06/25/2023   Procedure: IRRIGATION AND DEBRIDEMENT PERIRECTAL ABSCESS;  Surgeon: Diamantina Monks, MD;  Location: [facility];  Service: General;  Laterality: N/A;   IRRIGATION AND DEBRIDEMENT ABSCESS N/A 03/13/2015   Procedure: IRRIGATION AND DEBRIDEMENT ABSCESS;  Surgeon: Luretha Murphy, MD;  Location: [facility];  Service: General;  Laterality: N/A;   WISDOM TOOTH EXTRACTION      Allergies:  Allergies  Allergen Reactions   Chantix [Varenicline] Other (See Comments)    Bad dreams   Oxycodone Itching    Family History:  Family History  Problem Relation Age of Onset   Esophageal cancer Mother 46       smoker/used ETOH   Pneumonia Father    Diabetes Father    Hypertension Sister    Crohn's disease Brother  Crohn's disease Maternal Grandmother    Cancer Maternal Grandmother        patient unsure of type   Colon cancer Maternal Grandfather    Colon polyps Neg Hx    Rectal cancer Neg Hx    Stomach cancer Neg Hx     Social History:  Social History   Tobacco Use   Smoking status: Every Day    Current packs/day: 0.50    Average packs/day: 0.5 packs/day for 34.2 years (17.1 ttl pk-yrs)    Types: Cigarettes    Start date: 20   Smokeless tobacco: Never   Tobacco comments:    not ready to quit.  Vaping Use   Vaping status: Never Used  Substance Use Topics   Alcohol use: Yes     Comment: socially   Drug use: Yes    Frequency: 7.0 times per week    Types: Marijuana    Review of symptoms:  Constitutional:  Negative for unexplained weight loss, night sweats, fever, chills ENT:  Negative for nose bleeds, sinus pain, painful swallowing CV:  Negative for chest pain, shortness of breath, exercise intolerance, palpitations, loss of consciousness Resp:  Negative for cough, wheezing, shortness of breath GI:  Negative for nausea, vomiting, diarrhea, bloody stools GU:  Positives noted in HPI; otherwise negative for gross hematuria, dysuria, urinary incontinence Neuro:  Negative for seizures, poor balance, limb weakness, slurred speech Psych:  Negative for lack of energy, depression, anxiety Endocrine:  Negative for polydipsia, polyuria, symptoms of hypoglycemia (dizziness, hunger, sweating) Hematologic:  Negative for anemia, purpura, petechia, prolonged or excessive bleeding, use of anticoagulants  Allergic:  Negative for difficulty breathing or choking as a result of exposure to anything; no shellfish allergy; no allergic response (rash/itch) to materials, foods  Physical exam: BP 115/80   Pulse 81   Ht 5\' 8"  (1.727 m)   Wt 232 lb (105.2 kg)   BMI 35.28 kg/m  GENERAL APPEARANCE:  Well appearing, well developed, well nourished, NAD HEENT: Atraumatic, Normocephalic, oropharynx clear. NECK: Supple without lymphadenopathy or thyromegaly. LUNGS: Clear to auscultation bilaterally. HEART: Regular Rate and Rhythm without murmurs, gallops, or rubs. ABDOMEN: Soft, non-tender, No Masses. EXTREMITIES: Moves all extremities well.  Without clubbing, cyanosis, or edema. NEUROLOGIC:  Alert and oriented x 3, normal gait, CN II-XII grossly intact.  MENTAL STATUS:  Appropriate. BACK:  Non-tender to palpation.  No CVAT SKIN:  Warm, dry and intact.   GU: Penis:  circumcised Meatus: Normal Scrotum: normal, no masses Testis: normal without masses bilateral Epididymis:  normal Prostate: 40 g, NT, no nodules Rectum: Normal tone,  no masses or tenderness   Results: U/A: negative  PVR:  0 ml

## 2023-09-28 ENCOUNTER — Encounter: Payer: Self-pay | Admitting: Urology

## 2023-09-28 LAB — PSA: Prostate Specific Ag, Serum: 1.5 ng/mL (ref 0.0–4.0)

## 2023-10-03 ENCOUNTER — Other Ambulatory Visit: Payer: Self-pay | Admitting: Internal Medicine

## 2023-10-03 DIAGNOSIS — B20 Human immunodeficiency virus [HIV] disease: Secondary | ICD-10-CM

## 2023-10-04 ENCOUNTER — Telehealth (HOSPITAL_COMMUNITY): Payer: Self-pay

## 2023-10-04 DIAGNOSIS — F333 Major depressive disorder, recurrent, severe with psychotic symptoms: Secondary | ICD-10-CM

## 2023-10-04 MED ORDER — MIRTAZAPINE 7.5 MG PO TABS: 7.5000 mg | ORAL_TABLET | Freq: Every day | ORAL | 0 refills | Status: DC

## 2023-10-04 MED ORDER — HYDROXYZINE HCL 10 MG PO TABS: 10.0000 mg | ORAL_TABLET | Freq: Three times a day (TID) | ORAL | 0 refills | Status: DC | PRN

## 2023-10-04 MED ORDER — PROPRANOLOL HCL 10 MG PO TABS: 10.0000 mg | ORAL_TABLET | Freq: Two times a day (BID) | ORAL | 0 refills | Status: DC | PRN

## 2023-10-04 NOTE — Addendum Note (Signed)
 Addended by: Park Pope B on: 10/04/2023 02:35 PM   Modules accepted: Orders

## 2023-10-04 NOTE — Telephone Encounter (Signed)
 Refill sent.

## 2023-10-04 NOTE — Telephone Encounter (Signed)
Appt 4/16

## 2023-10-04 NOTE — Telephone Encounter (Signed)
 Hello,        Pt is requesting for a refill of mirtazapine (REMERON) 7.5 MG tablets to be sent to pharmacy.    Pt is also requesting for a refill of  HYDROxyzine to be sent to pharmacy and Propranolol as well.

## 2023-10-05 ENCOUNTER — Other Ambulatory Visit: Payer: Self-pay

## 2023-10-05 DIAGNOSIS — B20 Human immunodeficiency virus [HIV] disease: Secondary | ICD-10-CM

## 2023-10-05 MED ORDER — GENVOYA 150-150-200-10 MG PO TABS: 1.0000 | ORAL_TABLET | Freq: Every day | ORAL | 0 refills | Status: DC

## 2023-10-05 MED ORDER — DARUNAVIR 800 MG PO TABS: 800.0000 mg | ORAL_TABLET | Freq: Every day | ORAL | 0 refills | Status: DC

## 2023-10-05 NOTE — Progress Notes (Signed)
 Pharmacy called, states they did not receive refill. Will resend rx. Juanita Laster, RMA

## 2023-10-07 ENCOUNTER — Ambulatory Visit: Payer: Medicaid Other | Admitting: Internal Medicine

## 2023-10-10 ENCOUNTER — Other Ambulatory Visit: Payer: Self-pay | Admitting: Internal Medicine

## 2023-10-10 ENCOUNTER — Other Ambulatory Visit: Payer: Self-pay

## 2023-10-10 DIAGNOSIS — B20 Human immunodeficiency virus [HIV] disease: Secondary | ICD-10-CM

## 2023-10-10 MED ORDER — DARUNAVIR 800 MG PO TABS: 800.0000 mg | ORAL_TABLET | Freq: Every day | ORAL | 0 refills | Status: DC

## 2023-10-10 MED ORDER — GENVOYA 150-150-200-10 MG PO TABS: 1.0000 | ORAL_TABLET | Freq: Every day | ORAL | 0 refills | Status: DC

## 2023-10-12 ENCOUNTER — Encounter: Payer: Self-pay | Admitting: Internal Medicine

## 2023-10-12 ENCOUNTER — Ambulatory Visit (INDEPENDENT_AMBULATORY_CARE_PROVIDER_SITE_OTHER): Payer: MEDICAID | Admitting: Internal Medicine

## 2023-10-12 ENCOUNTER — Other Ambulatory Visit: Payer: Self-pay

## 2023-10-12 VITALS — BP 113/76 | HR 76 | Temp 97.6°F | Ht 68.0 in | Wt 234.0 lb

## 2023-10-12 DIAGNOSIS — Z1159 Encounter for screening for other viral diseases: Secondary | ICD-10-CM

## 2023-10-12 DIAGNOSIS — R413 Other amnesia: Secondary | ICD-10-CM

## 2023-10-12 DIAGNOSIS — Z113 Encounter for screening for infections with a predominantly sexual mode of transmission: Secondary | ICD-10-CM

## 2023-10-12 DIAGNOSIS — B20 Human immunodeficiency virus [HIV] disease: Secondary | ICD-10-CM | POA: Diagnosis not present

## 2023-10-12 MED ORDER — BICTEGRAVIR-EMTRICITAB-TENOFOV 50-200-25 MG PO TABS: 1.0000 | ORAL_TABLET | Freq: Every day | ORAL | 11 refills | Status: DC

## 2023-10-12 NOTE — Progress Notes (Signed)
 Subjective:    Patient ID: Shawn Brooks, male    DOB: May 21, 1976, 48 y.o.   MRN: [id number]  HPI Shawn Brooks is here for follow up of HIV He continues on [place] and Prezista with no missed doses.  No issus with getting or taking his medication.  He continues in care withmental health.  No complaints today.   10/12/23 id clinic visit; first with dr Renold Don See a&p Reviewed chart    Review of Systems  Constitutional:  Negative for fatigue.  Gastrointestinal:  Negative for diarrhea and nausea.  Skin:  Negative for rash.       Objective:   Physical Exam Eyes:     General: No scleral icterus. Pulmonary:     Effort: Pulmonary effort is normal.  Neurological:     Mental Status: He is alert.    SH: + tobacco  Labs: Lab Results  Component Value Date   WBC 7.7 08/30/2023   HGB 14.6 08/30/2023   HCT 44.6 08/30/2023   MCV 92 08/30/2023   PLT 245 08/30/2023   Last metabolic panel Lab Results  Component Value Date   GLUCOSE 98 06/27/2023   NA 137 06/27/2023   K 3.7 06/27/2023   CL 104 06/27/2023   CO2 25 06/27/2023   BUN 13 06/27/2023   CREATININE 0.97 06/27/2023   GFRNONAA >60 06/27/2023   CALCIUM 8.2 (L) 06/27/2023   PHOS 3.0 05/02/2017   PROT 8.0 06/23/2023   ALBUMIN 3.9 06/23/2023   LABGLOB 3.0 03/18/2023   AGRATIO 1.9 06/11/2021   BILITOT 0.7 06/23/2023   ALKPHOS 106 06/23/2023   AST 24 06/23/2023   ALT 13 06/23/2023   ANIONGAP 8 06/27/2023   HIV: 02/2023                  ud       /      1073 (32%)    HIV Genotype: 04/2010  HIV 1 Genotyping, DNA Seq                              See Comment      DRUG RESISTANCE MUTATION ANALYSIS      RESISTANCE INTERPRETATION      -----------------------------------  ------------------------------      abacavir (ABC)                         No Evidence of Resistance      didanosine (ddl)                       No Evidence of Resistance      lamivudine(3TC)/emtricitabine(FTC)     No Evidence of Resistance      stavudine  (d4T)                        No Evidence of Resistance      tenofovir (TDF)                        No Evidence of Resistance      zidovudine (AZT)                       No Evidence of Resistance      efavirenz (EFV)  No Evidence of Resistance      etravirine (ETR)                       No Evidence of Resistance      nevirapine (NVP)                       No Evidence of Resistance      atazanavir (ATV)                       No Evidence of Resistance        ATV/r**                              No Evidence of Resistance      darunavir + ritonavir(DRV/r)           No Evidence of Resistance      fosamprenavir (FPV)                    No Evidence of Resistance        FPV/r**                              No Evidence of Resistance      indinavir (IDV)                        No Evidence of Resistance        IDV/r**                              No Evidence of Resistance      lopinavir + ritonavir (LPV/r)          No Evidence of Resistance      nelfinavir (NFV)                       No Evidence of Resistance      saquinavir + ritonavir (SQV/r)         No Evidence of Resistance      tipranavir + ritonavir (TPV/r)         No Evidence of Resistance            **Protease Inhibitors administered with low-dose ritonavir for      pharmacological boosting.  ! Resistance Associated RT Mutations:                              See Comment      No Relevant Mutations Detected  ! 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 There as discussion previously with dr Seymour Dapper who was considering this given no prior resistance known and patient  had started treatment after dx and has been  well controlled  Patient has medicaid   -discussed u=u -encourage compliance -continue current HIV medication until he picks up biktarvy then start that once a day -labs today -f/u in 3 months given new medication change   #hx syphilis Titer nonreactive --> 1:1 and treated in 2020 Titer has been nonreactive since 2020; last titer 02/2023 nonreactive  -repeat titer today   #Other medical problems: -recurrent hsv -hlp -htn -copd -anxiety/depression    #concern about memory No sign of NPH Occasional etoh use only Hx syphilis no prior neurosyphilis dx No numbness/tingling/weakness No dx of sleep apnea (sleep study done early 2025) No hx TBI No family hx early onset dementia  Does have hx depression -- on mirtazepine; doesn't see psychiatry  Describe both short term and long term memory. Doesn't know to get around in [place] like he used to or remember what to do with his jobs  Duration 2-3 years as of 09/2023   -trying to get on disability -haven't spoken to pcp about this as he can't seem to remember -will forward chart to PCP to follow this up -I do not suspect related to his well treated HIV -neurology referral    #hcm -vaccination -hepatitis 10/12/23 hep b and c screening sent -std screening 02/2023 rpr nonreactive Rpr today and urine gc/chlam; will do swab next visit -tb screening -cancer screening Defer to pcp for age appropriate cancer screening Colonoscopy 06/2023 normal per patient's report Will do anal pap smear next visit    #social -born/raised [place]  -in a steady relationship for 20 years used to be open relationship: no sexual encounter outside of relationship since at least 02/2023 -travel -- [place], [place], [place], [place] , [place]; nothing outside of us  -marijuanna -tobacco -- 35 years 1 pack every 2 days -no hx ivdu or other street drug use -not currently working  10/12/23 due to memory

## 2023-10-12 NOTE — Patient Instructions (Signed)
 Memory issue -will forward chart to your pcp -referral to neurology    HIV medication: Stop prezista and genovya once you receive the biktarvy --> start biktarvy one tablet once a day when you receive it   Follow up with me in 3 months to do close monitoring of hiv and if ok for 2 times, will do once a year again    Labs today (blood and urine)   At some point down the line will need to do anal cancer screening Follow up with your pcp for prostate cancer/colon cancer screening

## 2023-10-12 NOTE — Progress Notes (Addendum)
 BH MD Outpatient Progress Note  10/13/2023 9:05 AM Shawn Brooks  MRN:  [id number]  Assessment:  Shawn Brooks presents for follow-up evaluation in-person. Today, patient reports continued worsening depressive symptoms with associated passive thoughts of death. Patient has also begun to have hypersomnia with no appetite improvement in past month. He endorses punching and kicking partner several times in past 2 weeks concerning for abnormal REM sleep behaviors and may be 2/2 Remeron . Discontinuing mirtazapine , decreasing trazodone  by half, and starting melatonin to address hypersomnia and REM sleep behaviors. Increasing fluoxetine  to aid with depressive symptoms. Memory problems persist but patient has also been unable to decrease cannabis use. He had experienced significant withdrawal symptoms when he attempted every other day cannabis use. Discussed gradually decreasing daily use as a strategy and he was amenable. Given the worsening symptoms of depression and ongoing passive SI with limited social support, patient has been referred to [facility] for more acute management of his MDD and provide additional social support. He is not felt to warrant inpatient hospitalization at this time and denies active SI.   Plan to screen for PTSD in subsequent visits given increased startle reflex, hypervigilance, and negative alteration in mood.   Risk Assessment: A suicide and violence risk assessment was performed as part of this evaluation. There patient is deemed to be at chronic elevated risk for self-harm/suicide given the following factors: suicidal ideation or threats without a plan, feelings of hopelessness, lack of social support, and current substance abuse. These risk factors are mitigated by the following factors: no known access to weapons or firearms, no history of violence, motivation for treatment, utilization of positive coping skills, expresses purpose for living, current treatment compliance, effective  problem solving skills, and safe housing. The patient is deemed to be at chronic elevated risk for violence given the following factors: homicidal ideation and current substance abuse. These risk factors are mitigated by the following factors: no known history of violence towards others, no known history of threats of harm towards others, and low impulsivity. There is no acute risk for suicide or violence at this time. The patient was educated about relevant modifiable risk factors including following recommendations for treatment of psychiatric illness and abstaining from substance abuse.  While future psychiatric events cannot be accurately predicted, the patient does not currently require  acute inpatient psychiatric care and does not currently meet [place]  involuntary commitment criteria.    Identifying Information: Shawn Brooks is a 48 y.o. male with historical diagnosis of MDD, social anxiety disorder, HIV on HAART threapy, HLD, and Vitamin D  deficiency who is an established patient with [facility] for medication management. He reports progressively worsening memory problems and chronically struggles with insomnia. Memory problems currently hypothesized secondary to both insufficient treatment of MDD and possibly cannabis use although uncertain if more is contributing as his memory problems do not correlate with worsening depression. Sleep study did not show signs of OSA. Diagnosis of MDD made based on patient's depressed mood, anhedonia, poor sleep, poor appetite, inattention. MOCA performed 06/02/23 was 27/30.   Plan:  # Major depressive disorder-recurrent episode, severe with psychotic features Past medication trials: Zoloft  Status of problem: stable Interventions: -- Increase Prozac  to 80 mg daily -- STOP mirtazapine  -- Referral to PHP  #Social Anxiety Disorder Unclear etiology. 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 -Continue propranolol  10 mg twice  daily   # Hypersomnia Past medication trials:  Status of problem: stable Interventions: -- Decrease trazodone  to  100 mg nightly -- stop mirtazapine  -- sleep study   REM Sleep Behavior Disorder -stop mirtazapine  -start melatonin 3 mg qhs  Cannabis Use Disorder Substance induced mood disorder -advise decrease use   # EtOH use disorder, in early remission Past medication trials: none Status of problem: improving Interventions: -- 3-4 beers per month -- Psychoeducation provided regarding risks of dependency, tolerance, and withdrawal as well as safe limits of use; patient expresses intent to continue to reduce use   # Memory problems -Unclear etiology but differential includes pseudodementia 2/2 MDD, wernicke's encepahlopathy versus HIV dementia versus iatrogenic   Folate deficiency Vitamin D  deficiency -Vitamin D  supplement -Multivitamin with folate  Patient was given contact information for behavioral health clinic and was instructed to call 911 for emergencies.   Subjective:  Chief Complaint:  Medication management  Interval History:  Patient presents as a follow-up in person.  He reports feeling "not too good". He states recently having even more crying spells, elevated stress/anxiety, and depressed mood.  He reports the only stressor he can think of that made into his depression is grandmother passing away several months ago but he does feel his symptoms are related to grief.  He reports struggling with hypersomnia (12-14 hours of sleep per night) as well as has kicked/punched partner at least 3-4 times in past 2 weeks while re-enacting a dream. He reports appetite remains low. He endorses passive thoughts of death as well as passive HI towards some long distance uncles that disrespected him over group messaging system. He reports experiencing increased startle reflex and VH from ambiguous objects appearing right in front of him.    Visit Diagnosis:    ICD-10-CM   1.  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 FLUoxetine  (PROZAC ) 40 MG capsule    traZODone  (DESYREL ) 100 MG tablet    propranolol  (INDERAL ) 10 MG tablet    2. 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 melatonin 3 MG TABS tablet    3. Cannabis use disorder, moderate, dependence (HCC)  F12.20     4. Social anxiety disorder  F40.10     5. Alcohol use disorder, moderate, in early remission (HCC)  F10.21     6. Vitamin D  deficiency  E55.9            Past Psychiatric History:  Diagnoses: Historical diagnosis of bipolar 1 disorder Medication trials: Zoloft  Substance use:             -- Etoh: Used to have 6 beers daily; denies history of withdrawal; now on 1 beer every ~2 weeks             -- Cannabis: Daily multiple blunts             -- Denies use of illicit drugs  Past Medical History:  Past Medical History:  Diagnosis Date   Anal condyloma    Anxiety    Blood dyscrasia    HIV   Chronic back pain    Constipation    takes miralax  daily   Depression    on meds   DJD (degenerative joint disease)    Gastric ulcer    GERD (gastroesophageal reflux disease)    Headache(784.0)    Hiatal hernia    HIV infection (HCC)    on meds   Hyperplastic colon polyp    Hypertension    on meds   Internal hemorrhoids    Sickle cell anemia (HCC)     Past  Surgical History:  Procedure Laterality Date   COLONOSCOPY  2019   JMP-MAC-prep good-polyps+   ESOPHAGOGASTRODUODENOSCOPY  03/27/2012   Procedure: ESOPHAGOGASTRODUODENOSCOPY (EGD);  Surgeon: Yvetta Herbert, MD;  Location: [facility];  Service: Endoscopy;  Laterality: N/A;   INCISION AND DRAINAGE PERIRECTAL ABSCESS N/A 06/25/2023   Procedure: IRRIGATION AND DEBRIDEMENT PERIRECTAL ABSCESS;  Surgeon: Anda Bamberg, MD;  Location: [facility];  Service: General;  Laterality: N/A;   IRRIGATION AND DEBRIDEMENT ABSCESS N/A 03/13/2015   Procedure: IRRIGATION AND DEBRIDEMENT ABSCESS;  Surgeon:  Jacolyn Matar, MD;  Location: [facility];  Service: General;  Laterality: N/A;   WISDOM TOOTH EXTRACTION      Family History:  Family History  Problem Relation Age of Onset   Esophageal cancer Mother 84       smoker/used ETOH   Pneumonia Father    Diabetes Father    Hypertension Sister    Crohn's disease Brother    Crohn's disease Maternal Grandmother    Cancer Maternal Grandmother        patient unsure of type   Colon cancer Maternal Grandfather    Colon polyps Neg Hx    Rectal cancer Neg Hx    Stomach cancer Neg Hx     Social History:  Social History   Socioeconomic History   Marital status: Divorced    Spouse name: Not on file   Number of children: 1   Years of education: Not on file   Highest education level: Not on file  Occupational History   Occupation: Disabled  Tobacco Use   Smoking status: Every Day    Current packs/day: 0.50    Average packs/day: 0.5 packs/day for 34.3 years (17.1 ttl pk-yrs)    Types: Cigarettes    Start date: 1991   Smokeless tobacco: Never   Tobacco comments:    not ready to quit.  Vaping Use   Vaping status: Never Used  Substance and Sexual Activity   Alcohol use: Yes    Comment: socially   Drug use: Yes    Frequency: 7.0 times per week    Types: Marijuana   Sexual activity: Not Currently    Partners: Male    Comment: declined condoms  Other Topics Concern   Not on file  Social History Narrative   Not on file   Social Drivers of Health   Financial Resource Strain: High Risk (08/30/2023)   Overall Financial Resource Strain (CARDIA)    Difficulty of Paying Living Expenses: Very hard  Food Insecurity: Food Insecurity Present (08/30/2023)   Hunger Vital Sign    Worried About Running Out of Food in the Last Year: Sometimes true    Ran Out of Food in the Last Year: Sometimes true  Transportation Needs: No Transportation Needs (09/07/2023)   PRAPARE - Administrator, Civil Service (Medical): No    Lack of Transportation  (Non-Medical): No  Physical Activity: Inactive (08/30/2023)   Exercise Vital Sign    Days of Exercise per Week: 0 days    Minutes of Exercise per Session: 0 min  Stress: Stress Concern Present (08/30/2023)   [employer] of Occupational Health - Occupational Stress Questionnaire    Feeling of Stress : Very much  Social Connections: Socially Isolated (08/30/2023)   Social Connection and Isolation Panel [NHANES]    Frequency of Communication with Friends and Family: More than three times a week    Frequency of Social Gatherings with Friends and Family: Never    Attends  Religious Services: Never    Active Member of Clubs or Organizations: No    Attends Banker Meetings: Never    Marital Status: Divorced    Allergies:  Allergies  Allergen Reactions   Chantix  [Varenicline ] Other (See Comments)    Bad dreams   Oxycodone  Itching    Current Medications: Current Outpatient Medications  Medication Sig Dispense Refill   melatonin 3 MG TABS tablet Take 1 tablet (3 mg total) by mouth at bedtime. 30 tablet 0   albuterol  (VENTOLIN  HFA) 108 (90 Base) MCG/ACT inhaler Inhale 2 puffs into the lungs every 6 (six) hours as needed for wheezing or shortness of breath. 18 g 1   AMBULATORY NON FORMULARY MEDICATION Medication Name: Diltiazem  2% gel:Lidocaine  5% - using your index finger, you should apply a small amount of medication inside the rectum up to your first knuckle/joint three times daily x 6-8 weeks. 45 g 1   bictegravir-emtricitabine -tenofovir  AF (BIKTARVY) 50-200-25 MG TABS tablet Take 1 tablet by mouth daily. 30 tablet 11   darunavir  (PREZISTA ) 800 MG tablet Take 1 tablet (800 mg total) by mouth daily. 30 tablet 0   FLUoxetine  (PROZAC ) 40 MG capsule Take 2 capsules (80 mg total) by mouth daily. 180 capsule 0   nicotine  (NICODERM CQ  - DOSED IN MG/24 HOURS) 21 mg/24hr patch Place 1 patch (21 mg total) onto the skin daily. 28 patch 1   propranolol  (INDERAL ) 10 MG tablet Take 1 tablet  (10 mg total) by mouth 2 (two) times daily as needed (anxiety). 60 tablet 0   rosuvastatin  (CRESTOR ) 5 MG tablet Take 5 mg by mouth daily.     tadalafil  (CIALIS ) 5 MG tablet Take 1 tablet (5 mg total) by mouth daily. 30 tablet 11   traZODone  (DESYREL ) 100 MG tablet Take 1 tablet (100 mg total) by mouth at bedtime. 30 tablet 0   valACYclovir  (VALTREX ) 500 MG tablet TAKE 1 TABLET BY MOUTH TWICE DAILY (Patient taking differently: Take 500 mg by mouth daily as needed (outbreaks).) 14 tablet 5   Vitamin D , Ergocalciferol , (DRISDOL ) 1.25 MG (50000 UNIT) CAPS capsule TAKE 1 CAPSULE BY MOUTH EVERY 7 DAYS 12 capsule 1   No current facility-administered medications for this visit.    ROS: Review of Systems  Objective:  Psychiatric Specialty Exam: There were no vitals taken for this visit.There is no height or weight on file to calculate BMI.  General Appearance: Casual  Eye Contact:  Good  Speech:  Clear and Coherent and Normal Rate  Volume:  Normal  Mood:  Depressed  Affect:  Depressed and Tearful  Thought Process:  Coherent, Goal Directed, and Linear  Orientation:  Full (Time, Place, and Person)  Thought Content: Logical   Suicidal Thoughts:  No  Homicidal Thoughts:  No  Memory:  Remote;   Good  Judgment:  Fair  Insight:  Fair  Psychomotor Activity:  Normal  Concentration:  Concentration: Good and Attention Span: Good              Assets:  Communication Skills Desire for Improvement Financial Resources/Insurance Housing Intimacy Leisure Time Physical Health Resilience Social Support Talents/Skills Transportation Vocational/Educational  ADL's:  Intact  Cognition: WNL  Sleep:  Good   PE: General: well-appearing; no acute distress  Pulm: no increased work of breathing on room air  Strength & Muscle Tone: within normal limits Neuro: no focal neurological deficits observed  Gait & Station: normal  Metabolic Disorder Labs: Lab Results  Component Value Date  HGBA1C  5.7 04/08/2023   MPG 117 (H) 11/16/2013   No results found for: "PROLACTIN" Lab Results  Component Value Date   CHOL 200 (H) 04/08/2023   TRIG 130 04/08/2023   HDL 53 04/08/2023   CHOLHDL 3.8 04/08/2023   VLDL 28 05/12/2016   LDLCALC 124 (H) 04/08/2023   LDLCALC 173 (H) 10/07/2022   Lab Results  Component Value Date   TSH 2.370 03/18/2023   TSH 1.150 09/01/2017    Therapeutic Level Labs: No results found for: "LITHIUM" No results found for: "VALPROATE" No results found for: "CBMZ"  Screenings: GAD-7    Flowsheet Row Office Visit from 10/12/2023 in [facility] - A Dept Of [place]. [facility] Office Visit from 08/30/2023 in [facility] - A Dept Of [place]. [facility] Office Visit from 04/08/2023 in [facility] [place] - A Dept Of [place]. [facility] Office Visit from 10/07/2022 in [facility] [place] - A Dept Of [place]. [facility] Video Visit from 03/18/2022 in [facility]  Total GAD-7 Score 19 7 15  0 16      PHQ2-9    Flowsheet Row Office Visit from 10/12/2023 in [facility] - A Dept Of [place]. [facility] Office Visit from 08/30/2023 in [facility] - A Dept Of [place]. [facility] Office Visit from 04/08/2023 in [facility] [place] - A Dept Of [place]. [facility] Office Visit from 03/10/2023 in [facility] - A Dept Of [place]. [facility] Office Visit from 10/07/2022 in [facility] [place] - A Dept Of [place]. [facility]  PHQ-2 Total Score 6 2 5  0 0  PHQ-9 Total Score 23 9 13  -- 0      Flowsheet Row ED to Hosp-Admission (Discharged) from 06/23/2023 in [facility]  SURGICAL Video Visit from 03/18/2022 in [facility] Video Visit from 11/04/2021 in [facility]  C-SSRS RISK CATEGORY No Risk No Risk No Risk       Collaboration of Care: Collaboration of Care:   Patient/Guardian was advised Release of Information must be obtained prior to any record release in order to collaborate their care with an outside provider. Patient/Guardian was advised if they have not already done so to contact the registration department to sign all necessary forms in order for us  to release information regarding their care.   Consent: Patient/Guardian gives verbal consent for treatment and assignment of benefits for services provided during this visit. Patient/Guardian expressed understanding and agreed to proceed.   A total of 30 minutes was spent involved in face to face clinical care, chart review, and documentation.   Augusta Blizzard, MD 10/13/2023, 9:05 AM

## 2023-10-13 ENCOUNTER — Telehealth (HOSPITAL_COMMUNITY): Payer: Self-pay | Admitting: Licensed Clinical Social Worker

## 2023-10-13 ENCOUNTER — Ambulatory Visit (INDEPENDENT_AMBULATORY_CARE_PROVIDER_SITE_OTHER): Payer: MEDICAID | Admitting: Student

## 2023-10-13 DIAGNOSIS — G4752 REM sleep behavior disorder: Secondary | ICD-10-CM

## 2023-10-13 DIAGNOSIS — E559 Vitamin D deficiency, unspecified: Secondary | ICD-10-CM

## 2023-10-13 DIAGNOSIS — F1220 Cannabis dependence, uncomplicated: Secondary | ICD-10-CM | POA: Diagnosis not present

## 2023-10-13 DIAGNOSIS — F333 Major depressive disorder, recurrent, severe with psychotic symptoms: Secondary | ICD-10-CM

## 2023-10-13 DIAGNOSIS — F1021 Alcohol dependence, in remission: Secondary | ICD-10-CM

## 2023-10-13 DIAGNOSIS — R413 Other amnesia: Secondary | ICD-10-CM

## 2023-10-13 DIAGNOSIS — F4010 Social phobia, unspecified: Secondary | ICD-10-CM | POA: Diagnosis not present

## 2023-10-13 LAB — C. TRACHOMATIS/N. GONORRHOEAE RNA
C. trachomatis RNA, TMA: NOT DETECTED
N. gonorrhoeae RNA, TMA: NOT DETECTED

## 2023-10-13 MED ORDER — PROPRANOLOL HCL 10 MG PO TABS: 10.0000 mg | ORAL_TABLET | Freq: Two times a day (BID) | ORAL | 0 refills | Status: DC | PRN

## 2023-10-13 MED ORDER — MELATONIN 3 MG PO TABS: 3.0000 mg | ORAL_TABLET | Freq: Every day | ORAL | 0 refills | Status: AC

## 2023-10-13 MED ORDER — FLUOXETINE HCL 40 MG PO CAPS: 80.0000 mg | ORAL_CAPSULE | Freq: Every day | ORAL | 0 refills | Status: DC

## 2023-10-13 MED ORDER — TRAZODONE HCL 100 MG PO TABS: 100.0000 mg | ORAL_TABLET | Freq: Every day | ORAL | 0 refills | Status: DC

## 2023-10-14 ENCOUNTER — Other Ambulatory Visit: Payer: Self-pay | Admitting: Internal Medicine

## 2023-10-14 LAB — COMPLETE METABOLIC PANEL WITHOUT GFR
AG Ratio: 1.5 (calc) (ref 1.0–2.5)
ALT: 21 U/L (ref 9–46)
AST: 19 U/L (ref 10–35)
Albumin: 4.5 g/dL (ref 3.6–5.1)
Alkaline phosphatase (APISO): 87 U/L (ref 35–144)
BUN: 9 mg/dL (ref 7–25)
CO2: 28 mmol/L (ref 20–32)
Calcium: 9.8 mg/dL (ref 8.6–10.3)
Chloride: 102 mmol/L (ref 98–110)
Creat: 1.22 mg/dL (ref 0.70–1.30)
Globulin: 3 g/dL (ref 1.9–3.7)
Glucose, Bld: 87 mg/dL (ref 65–99)
Potassium: 4.2 mmol/L (ref 3.5–5.3)
Sodium: 139 mmol/L (ref 135–146)
Total Bilirubin: 0.3 mg/dL (ref 0.2–1.2)
Total Protein: 7.5 g/dL (ref 6.1–8.1)

## 2023-10-14 LAB — CBC
HCT: 45.2 % (ref 38.5–50.0)
Hemoglobin: 15.2 g/dL (ref 13.2–17.1)
MCH: 30 pg (ref 27.0–33.0)
MCHC: 33.6 g/dL (ref 32.0–36.0)
MCV: 89.3 fL (ref 80.0–100.0)
MPV: 11 fL (ref 7.5–12.5)
Platelets: 224 10*3/uL (ref 140–400)
RBC: 5.06 10*6/uL (ref 4.20–5.80)
RDW: 13.9 % (ref 11.0–15.0)
WBC: 7.7 10*3/uL (ref 3.8–10.8)

## 2023-10-14 LAB — HEPATITIS B SURFACE ANTIGEN: Hepatitis B Surface Ag: NONREACTIVE

## 2023-10-14 LAB — HEPATITIS B CORE ANTIBODY, TOTAL: Hep B Core Total Ab: NONREACTIVE

## 2023-10-14 LAB — HEPATITIS B SURFACE ANTIBODY, QUANTITATIVE: Hep B S AB Quant (Post): <5 m[IU]/mL — ABNORMAL LOW (ref >=10)

## 2023-10-14 LAB — HIV-1 RNA QUANT-NO REFLEX-BLD
HIV 1 RNA Quant: NOT DETECTED {copies}/mL
HIV-1 RNA Quant, Log: NOT DETECTED {Log_copies}/mL

## 2023-10-14 LAB — HEPATITIS C ANTIBODY: Hepatitis C Ab: NONREACTIVE

## 2023-10-14 LAB — RPR: RPR Ser Ql: NONREACTIVE

## 2023-10-14 LAB — TSH+FREE T4: TSH W/REFLEX TO FT4: 1.86 m[IU]/L (ref 0.40–4.50)

## 2023-10-17 ENCOUNTER — Telehealth: Payer: Self-pay

## 2023-10-17 NOTE — Telephone Encounter (Signed)
-----   Message from Shawn Brooks sent at 10/17/2023  3:41 PM EDT ----- Please let him know that his hiv labs look great  I hope he gets to see neurology for his memory loss  thanks

## 2023-10-19 ENCOUNTER — Ambulatory Visit (HOSPITAL_COMMUNITY): Payer: MEDICAID

## 2023-10-19 ENCOUNTER — Ambulatory Visit: Payer: MEDICAID

## 2023-10-19 DIAGNOSIS — F333 Major depressive disorder, recurrent, severe with psychotic symptoms: Secondary | ICD-10-CM | POA: Diagnosis not present

## 2023-10-19 DIAGNOSIS — F1220 Cannabis dependence, uncomplicated: Secondary | ICD-10-CM
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
## 2023-10-20 ENCOUNTER — Ambulatory Visit: Payer: MEDICAID

## 2023-10-20 ENCOUNTER — Other Ambulatory Visit: Payer: Self-pay

## 2023-10-25 ENCOUNTER — Ambulatory Visit (HOSPITAL_COMMUNITY): Payer: MEDICAID | Admitting: Licensed Clinical Social Worker

## 2023-10-25 DIAGNOSIS — F333 Major depressive disorder, recurrent, severe with psychotic symptoms: Secondary | ICD-10-CM | POA: Diagnosis not present

## 2023-10-25 DIAGNOSIS — F319 Bipolar disorder, unspecified: Secondary | ICD-10-CM
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
## 2023-10-26 ENCOUNTER — Encounter: Payer: Self-pay | Admitting: Internal Medicine

## 2023-10-26 ENCOUNTER — Other Ambulatory Visit: Payer: Self-pay

## 2023-10-26 ENCOUNTER — Encounter (HOSPITAL_COMMUNITY): Payer: Self-pay

## 2023-10-26 ENCOUNTER — Ambulatory Visit (HOSPITAL_COMMUNITY): Payer: MEDICAID | Admitting: Licensed Clinical Social Worker

## 2023-10-26 DIAGNOSIS — F333 Major depressive disorder, recurrent, severe with psychotic symptoms: Secondary | ICD-10-CM | POA: Diagnosis not present
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
## 2023-10-26 NOTE — Psych (Signed)
 Virtual Visit via Video Note  I connected with Shawn Brooks on 10/25/23 at  9:00 AM EDT by a video enabled telemedicine application and verified that I am speaking with the correct person using two identifiers.  Location: Patient: patient home Provider: clinical home office   I discussed the limitations of evaluation and management by telemedicine and the availability of in person appointments. The patient expressed understanding and agreed to proceed  I discussed the assessment and treatment plan with the patient. The patient was provided an opportunity to ask questions and all were answered. The patient agreed with the plan and demonstrated an understanding of the instructions.   The patient was advised to call back or seek an in-person evaluation if the symptoms worsen or if the condition fails to improve as anticipated.  Pt was provided 240 minutes of non-face-to-face time during this encounter.   Ethelle Herb, LCSW   [facility] PHP THERAPIST PROGRESS NOTE  [facility] [id number]  Session Time: 9:00 - 10:00  Participation Level: Active  Behavioral Response: CasualAlertDepressed  Type of Therapy: Group Therapy  Treatment Goals addressed: Coping  Progress Towards Goals: Initial  Interventions: CBT, DBT, Supportive, and Reframing  Summary: [name] is a 48 y.o. male who presents with depression symptoms.  Clinician led check-in regarding current stressors and situation, and review of patient completed daily inventory. Clinician utilized active listening and empathetic response and validated patient emotions. Clinician facilitated processing group on pertinent issues.   Therapist Response: Patient arrived within time allowed. Patient rates his mood at a 4 on a scale of 1-10 with 10 being best. Pt states he feels "depressed." Pt states he slept 8 hours and ate 2x. Pt reports he is struggling with low mood, low energy, and hopelessness. Pt able to process. Pt  engaged in discussion.          Session Time: 10:00 am - 11:00 am   Participation Level: Active   Behavioral Response: CasualAlertDepressed   Type of Therapy: Group Therapy   Treatment Goals addressed: Coping   Progress Towards Goals: Progressing   Interventions: CBT, DBT, Solution Focused, Strength-based, Supportive, and Reframing   Summary: Cln led discussion on change. Group members shared ways in which they struggle with change and what makes it difficult. Cln brought in DBT radical acceptance and CBT thought challenging as a way to manage issues with change.    Therapist Response:  Pt engaged in discussion and is able to connect with topic.           Session Time: 11:00 -12:00   Participation Level: Active   Behavioral Response: CasualAlertDepressed   Type of Therapy: Group Therapy   Treatment Goals addressed: Coping   Progress Towards Goals: Progressing   Interventions: CBT, DBT, Solution Focused, Strength-based, Supportive, and Reframing   Summary: Cln led discussion on communicating our struggles with our support team. Cln introduced the concept of giving disclosures to the people close to us  regarding the way we act in specific situations or the way we process certain stimuli. Group members discussed ways to provide this "road map to our brain" and in what situations this may be helpful to them.   Therapist Response: Pt engaged in discussion and is able to determine situations in which providing a disclosure can be helpful and practiced doing so.           Session Time: 12:00 -1:00   Participation Level: Active   Behavioral Response: CasualAlertDepressed   Type of  Therapy: Group therapy   Treatment Goals addressed: Coping   Progress Towards Goals: Progressing   Interventions: OT group   Summary: 12:00 - 12:50: Occupational Therapy group with cln E. Hollan.  12:50 - 1:00 Clinician assessed for immediate needs, medication compliance and efficacy,  and safety concerns.   Therapist Response: 12:00 - 12:50: Pt participated 12:50 - 1:00 pm: At check-out, patient reports no immediate concerns. Patient demonstrates progress as evidenced by participating in first group session. Patient denies SI/HI/self-harm thoughts at the end of group.      Suicidal/Homicidal: Nowithout intent/plan  Plan: Pt will continue in PHP while working to decrease depression symptoms, increase daily functioning, and increase ability to manage symptoms in a healthy manner.   Collaboration of Care: Medication Management AEB T Lewis, NP  Patient/Guardian was advised Release of Information must be obtained prior to any record release in order to collaborate their care with an outside provider. Patient/Guardian was advised if they have not already done so to contact the registration department to sign all necessary forms in order for us  to release information regarding their care.   Consent: Patient/Guardian gives verbal consent for treatment and assignment of benefits for services provided during this visit. Patient/Guardian expressed understanding and agreed to proceed.   Diagnosis: Severe episode of recurrent major depressive disorder, with psychotic features (HCC) [F33.3]    1. Severe episode of recurrent major depressive disorder, with psychotic features (HCC)   2. Bipolar 1 disorder (HCC)       Ethelle Herb, LCSW
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
## 2023-10-27 ENCOUNTER — Ambulatory Visit (INDEPENDENT_AMBULATORY_CARE_PROVIDER_SITE_OTHER): Payer: MEDICAID | Admitting: Licensed Clinical Social Worker

## 2023-10-27 DIAGNOSIS — F333 Major depressive disorder, recurrent, severe with psychotic symptoms: Secondary | ICD-10-CM

## 2023-10-28 ENCOUNTER — Ambulatory Visit (INDEPENDENT_AMBULATORY_CARE_PROVIDER_SITE_OTHER): Payer: MEDICAID | Admitting: Licensed Clinical Social Worker

## 2023-10-28 DIAGNOSIS — G479 Sleep disorder, unspecified: Secondary | ICD-10-CM

## 2023-10-28 DIAGNOSIS — F333 Major depressive disorder, recurrent, severe with psychotic symptoms: Secondary | ICD-10-CM
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
## 2023-10-31 ENCOUNTER — Ambulatory Visit (HOSPITAL_COMMUNITY): Payer: MEDICAID

## 2023-11-01 ENCOUNTER — Ambulatory Visit (HOSPITAL_COMMUNITY): Payer: MEDICAID | Admitting: Licensed Clinical Social Worker

## 2023-11-01 ENCOUNTER — Encounter (HOSPITAL_COMMUNITY): Payer: Self-pay | Admitting: Licensed Clinical Social Worker

## 2023-11-01 DIAGNOSIS — F333 Major depressive disorder, recurrent, severe with psychotic symptoms: Secondary | ICD-10-CM

## 2023-11-01 DIAGNOSIS — G4752 REM sleep behavior disorder: Secondary | ICD-10-CM

## 2023-11-01 NOTE — Progress Notes (Signed)
 Virtual Visit via Video Note  I connected with Shawn Brooks on 11/01/23 at  9:00 AM EDT by a video enabled telemedicine application and verified that I am speaking with the correct person using two identifiers.  Location: Patient: Home Provider: Office   I discussed the limitations of evaluation and management by telemedicine and the availability of in person appointments. The patient expressed understanding and agreed to proceed.  I discussed the assessment and treatment plan with the patient. The patient was provided an opportunity to ask questions and all were answered. The patient agreed with the plan and demonstrated an understanding of the instructions.   The patient was advised to call back or seek an in-person evaluation if the symptoms worsen or if the condition fails to improve as anticipated.  I provided 10 minutes of non-face-to-face time during this encounter.   Levester Reagin, NP   [facility] MD/PA/NP OP Progress Note  11/01/2023 3:01 PM Shawn Brooks  MRN:  [id number]  Chief Complaint: " I was talking in my sleep coherent sentences, I know that I am mumbles sometimes but I think the medication makes me talk out loud."  HPI: Shawn Brooks 48 year old African-American male seen and evaluated for weekly progress note during partial hospitalization programming.  Reported concerns due to sleep disturbance.  Stated he recently started taking melatonin which he added to his nighttime medication regimen.  States some concerns related to talking while sleeping.  States he was able to get adequate rest and go back to sleep. Overall his mood has stabilized.  States he is unsure if group setting is beneficial.  States he will continue to attend sessions.  Anticipated discharge early next week.  No concerns related to suicidal or homicidal ideations.  Denies auditory visual hallucinations.  Denied any medication refills at this visit.  Presents irritable however engaged throughout this  assessment.  Support, encouragement and reassurance was provided.   Visit Diagnosis:    ICD-10-CM   1. 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       Past Psychiatric History:   Past Medical History:  Past Medical History:  Diagnosis Date   Anal condyloma    Anxiety    Blood dyscrasia    HIV   Chronic back pain    Constipation    takes miralax  daily   Depression    on meds   DJD (degenerative joint disease)    Gastric ulcer    GERD (gastroesophageal reflux disease)    Headache(784.0)    Hiatal hernia    HIV infection (HCC)    on meds   Hyperplastic colon polyp    Hypertension    on meds   Internal hemorrhoids    Sickle cell anemia (HCC)     Past Surgical History:  Procedure Laterality Date   COLONOSCOPY  2019   JMP-MAC-prep good-polyps+   ESOPHAGOGASTRODUODENOSCOPY  03/27/2012   Procedure: ESOPHAGOGASTRODUODENOSCOPY (EGD);  Surgeon: Yvetta Herbert, MD;  Location: [facility];  Service: Endoscopy;  Laterality: N/A;   INCISION AND DRAINAGE PERIRECTAL ABSCESS N/A 06/25/2023   Procedure: IRRIGATION AND DEBRIDEMENT PERIRECTAL ABSCESS;  Surgeon: Anda Bamberg, MD;  Location: [facility];  Service: General;  Laterality: N/A;   IRRIGATION AND DEBRIDEMENT ABSCESS N/A 03/13/2015   Procedure: IRRIGATION AND DEBRIDEMENT ABSCESS;  Surgeon: Jacolyn Matar, MD;  Location: [facility];  Service: General;  Laterality: N/A;   WISDOM  TOOTH EXTRACTION      Family Psychiatric History:   Family History:  Family History  Problem Relation Age of Onset   Drug abuse Mother    Alcohol abuse Mother    Esophageal cancer Mother 59       smoker/used ETOH   Alcohol abuse Father    Drug abuse Father    Pneumonia Father    Diabetes Father    Hypertension Sister    Crohn's disease Brother    Colon cancer Maternal Grandfather    Crohn's disease Maternal Grandmother    Cancer Maternal  Grandmother        patient unsure of type   Colon polyps Neg Hx    Rectal cancer Neg Hx    Stomach cancer Neg Hx     Social History:  Social History   Socioeconomic History   Marital status: Divorced    Spouse name: Not on file   Number of children: 1   Years of education: Not on file   Highest education level: Some college, no degree  Occupational History   Occupation: Disabled  Tobacco Use   Smoking status: Every Day    Current packs/day: 0.50    Average packs/day: 0.5 packs/day for 34.3 years (17.2 ttl pk-yrs)    Types: Cigarettes    Start date: 1991   Smokeless tobacco: Never   Tobacco comments:    not ready to quit.  Vaping Use   Vaping status: Never Used  Substance and Sexual Activity   Alcohol use: Yes    Comment: socially   Drug use: Yes    Frequency: 7.0 times per week    Types: Marijuana   Sexual activity: Not Currently    Partners: Male    Comment: declined condoms  Other Topics Concern   Not on file  Social History Narrative   Not on file   Social Drivers of Health   Financial Resource Strain: High Risk (08/30/2023)   Overall Financial Resource Strain (CARDIA)    Difficulty of Paying Living Expenses: Very hard  Food Insecurity: Food Insecurity Present (08/30/2023)   Hunger Vital Sign    Worried About Running Out of Food in the Last Year: Sometimes true    Ran Out of Food in the Last Year: Sometimes true  Transportation Needs: No Transportation Needs (09/07/2023)   PRAPARE - Administrator, Civil Service (Medical): No    Lack of Transportation (Non-Medical): No  Physical Activity: Inactive (08/30/2023)   Exercise Vital Sign    Days of Exercise per Week: 0 days    Minutes of Exercise per Session: 0 min  Stress: Stress Concern Present (08/30/2023)   [employer] of Occupational Health - Occupational Stress Questionnaire    Feeling of Stress : Very much  Social Connections: Socially Isolated (08/30/2023)   Social Connection and Isolation  Panel [NHANES]    Frequency of Communication with Friends and Family: More than three times a week    Frequency of Social Gatherings with Friends and Family: Never    Attends Religious Services: Never    Database administrator or Organizations: No    Attends Banker Meetings: Never    Marital Status: Divorced    Allergies:  Allergies  Allergen Reactions   Chantix  [Varenicline ] Other (See Comments)    Bad dreams   Oxycodone  Itching    Metabolic Disorder Labs: Lab Results  Component Value Date   HGBA1C 5.7 04/08/2023   MPG 117 (H) 11/16/2013  No results found for: "PROLACTIN" Lab Results  Component Value Date   CHOL 200 (H) 04/08/2023   TRIG 130 04/08/2023   HDL 53 04/08/2023   CHOLHDL 3.8 04/08/2023   VLDL 28 05/12/2016   LDLCALC 124 (H) 04/08/2023   LDLCALC 173 (H) 10/07/2022   Lab Results  Component Value Date   TSH 2.370 03/18/2023   TSH 1.150 09/01/2017    Therapeutic Level Labs: No results found for: "LITHIUM" No results found for: "VALPROATE" No results found for: "CBMZ"  Current Medications: Current Outpatient Medications  Medication Sig Dispense Refill   albuterol  (VENTOLIN  HFA) 108 (90 Base) MCG/ACT inhaler Inhale 2 puffs into the lungs every 6 (six) hours as needed for wheezing or shortness of breath. 18 g 1   AMBULATORY NON FORMULARY MEDICATION Medication Name: Diltiazem  2% gel:Lidocaine  5% - using your index finger, you should apply a small amount of medication inside the rectum up to your first knuckle/joint three times daily x 6-8 weeks. 45 g 1   bictegravir-emtricitabine -tenofovir  AF (BIKTARVY) 50-200-25 MG TABS tablet Take 1 tablet by mouth daily. 30 tablet 11   Eluxadoline  (VIBERZI ) 100 MG TABS TAKE 1 TABLET BY MOUTH 1-2 TIMES EVERY DAY 180 tablet 0   FLUoxetine  (PROZAC ) 40 MG capsule Take 2 capsules (80 mg total) by mouth daily. 180 capsule 0   melatonin 3 MG TABS tablet Take 1 tablet (3 mg total) by mouth at bedtime. 30 tablet 0    nicotine  (NICODERM CQ  - DOSED IN MG/24 HOURS) 21 mg/24hr patch Place 1 patch (21 mg total) onto the skin daily. (Patient not taking: Reported on 10/26/2023) 28 patch 1   propranolol  (INDERAL ) 10 MG tablet Take 1 tablet (10 mg total) by mouth 2 (two) times daily as needed (anxiety). 60 tablet 0   rosuvastatin  (CRESTOR ) 5 MG tablet Take 5 mg by mouth daily.     tadalafil  (CIALIS ) 5 MG tablet Take 1 tablet (5 mg total) by mouth daily. 30 tablet 11   traZODone  (DESYREL ) 100 MG tablet Take 1 tablet (100 mg total) by mouth at bedtime. 30 tablet 0   valACYclovir  (VALTREX ) 500 MG tablet TAKE 1 TABLET BY MOUTH TWICE DAILY (Patient taking differently: Take 500 mg by mouth daily as needed (outbreaks).) 14 tablet 5   Vitamin D , Ergocalciferol , (DRISDOL ) 1.25 MG (50000 UNIT) CAPS capsule TAKE 1 CAPSULE BY MOUTH EVERY 7 DAYS 12 capsule 1   No current facility-administered medications for this visit.     Musculoskeletal:  Psychiatric Specialty Exam: Review of Systems  There were no vitals taken for this visit.There is no height or weight on file to calculate BMI.  General Appearance: Casual  Eye Contact:  Good  Speech:  Clear and Coherent  Volume:  Normal  Mood:  Anxious and Depressed  Affect:  Congruent  Thought Process:  Coherent  Orientation:  Full (Time, Place, and Person)  Thought Content: Logical   Suicidal Thoughts:  No  Homicidal Thoughts:  No  Memory:  Immediate;   Good Recent;   Good  Judgement:  Good  Insight:  Good  Psychomotor Activity:  Normal  Concentration:  Concentration: Good  Recall:  Good  Fund of Knowledge: Good  Language: Good  Akathisia:  No  Handed:  Right  AIMS (if indicated): not done  Assets:  Communication Skills Desire for Improvement  ADL's:  Intact  Cognition: WNL  Sleep:  Good   Screenings: GAD-7    Flowsheet Row Office Visit from 10/12/2023 in [facility] - A Dept Of [place]. [facility] Office Visit from 08/30/2023 in [facility] - A Dept Of [place]. [facility] Office Visit from 04/08/2023 in [facility] [place] - A Dept Of [place]. [facility] Office Visit from 10/07/2022 in [facility] [place] - A Dept Of [place]. [facility] Video Visit from [facility]18/20[facility] in [facility]  Total GAD-7 Score 19 7 15  0 16      PHQ2-9    Flowsheet Row Counselor from 10/26/2023 in [facility] Counselor from 10/19/2023 in [facility] Office Visit from 10/12/2023 in [facility] - A Dept Of [place]. [facility] Office Visit from 08/30/2023 in [facility] [place] - A Dept Of [place]. [facility] Office Visit from 04/08/2023 in [facility] [place] - A Dept Of [place]. [facility]  PHQ-2 Total Score 6 6 6 2 5   PHQ-9 Total Score 21 23 23 9 13       Flowsheet Row Counselor from 10/26/2023 in [facility] Counselor from 10/19/2023 in [facility] ED to Hosp-Admission (Discharged) from 06/23/2023 in [facility]  SURGICAL  C-SSRS RISK CATEGORY Moderate Risk Moderate Risk No Risk        Assessment and Plan:  Continue partial hospitalization programming [facility] - Continue medications as directed  Collaboration of Care: Collaboration of Care: Medication Management AEB Continue melatonin  Patient/Guardian was advised Release of Information must be obtained prior to any record release in order to collaborate their care with an outside provider. Patient/Guardian was advised if they have not already done so to contact the registration department to sign all necessary forms in order for us  to release information regarding their care.   Consent: Patient/Guardian gives verbal consent for treatment and assignment of benefits for services  provided during this visit. Patient/Guardian expressed understanding and agreed to proceed.    Levester Reagin, NP 11/01/2023, 3:01 PM

## 2023-11-02 ENCOUNTER — Ambulatory Visit (INDEPENDENT_AMBULATORY_CARE_PROVIDER_SITE_OTHER): Payer: MEDICAID | Admitting: Licensed Clinical Social Worker

## 2023-11-02 DIAGNOSIS — F333 Major depressive disorder, recurrent, severe with psychotic symptoms: Secondary | ICD-10-CM

## 2023-11-02 NOTE — Progress Notes (Signed)
 Spoke with patient via Teams video call, used 2 identifiers to correctly identify patient. States he is sleeping ok most days but is talking in his sleep so loudly that he wakes himself up. Did not sleep last night because his partner was fired from his job yesterday. They were trying to get a house and move out of his partners son's home. Denies SI/HI but does sometimes see objects out of the corner of his eyes or people that are not there. Only complaint is that he feels one person in group is talking too much and needs more redirection. On scale 1-10 as 10 being worst he rates depression at 5 and anxiety at 7. No other issues or complaints.

## 2023-11-03 ENCOUNTER — Ambulatory Visit (HOSPITAL_COMMUNITY): Payer: MEDICAID | Admitting: Licensed Clinical Social Worker

## 2023-11-03 DIAGNOSIS — F333 Major depressive disorder, recurrent, severe with psychotic symptoms: Secondary | ICD-10-CM | POA: Diagnosis not present

## 2023-11-04 ENCOUNTER — Ambulatory Visit (HOSPITAL_COMMUNITY): Payer: MEDICAID | Admitting: Licensed Clinical Social Worker

## 2023-11-04 DIAGNOSIS — F333 Major depressive disorder, recurrent, severe with psychotic symptoms: Secondary | ICD-10-CM

## 2023-11-07 ENCOUNTER — Ambulatory Visit (HOSPITAL_COMMUNITY): Payer: MEDICAID | Admitting: Licensed Clinical Social Worker

## 2023-11-07 DIAGNOSIS — F333 Major depressive disorder, recurrent, severe with psychotic symptoms: Secondary | ICD-10-CM

## 2023-11-08 ENCOUNTER — Ambulatory Visit (HOSPITAL_COMMUNITY): Payer: MEDICAID | Admitting: Licensed Clinical Social Worker

## 2023-11-08 DIAGNOSIS — F333 Major depressive disorder, recurrent, severe with psychotic symptoms: Secondary | ICD-10-CM

## 2023-11-08 MED ORDER — LAMOTRIGINE 25 MG PO TABS: 25.0000 mg | ORAL_TABLET | Freq: Every day | ORAL | 0 refills | Status: DC

## 2023-11-08 NOTE — Progress Notes (Signed)
 Virtual Visit via Video Note  I connected with Shawn Brooks on 11/08/23 at  9:00 AM EDT by a video enabled telemedicine application and verified that I am speaking with the correct person using two identifiers.  Location: Patient: Home Provider: Office   I discussed the limitations of evaluation and management by telemedicine and the availability of in person appointments. The patient expressed understanding and agreed to proceed.  I discussed the assessment and treatment plan with the patient. The patient was provided an opportunity to ask questions and all were answered. The patient agreed with the plan and demonstrated an understanding of the instructions.   The patient was advised to call back or seek an in-person evaluation if the symptoms worsen or if the condition fails to improve as anticipated.  I provided 10 minutes of non-face-to-face time during this encounter.   Levester Reagin, NP   [facility] MD/PA/NP OP Progress Note  11/08/2023 11:15 AM Shawn Brooks  MRN:  [id number]  Chief Complaint: I am interested in being started on a mood stabilizer for my anger issues"  HPI: Shawn Brooks 48 year old African-American male seen and evaluated for weekly progress follow-up assessment.  Patient reports symptoms of mood irritability and anger issues.  States he has been very tearful and experiencing mood lability.  States he is interested in starting a mood stabilizer. 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  States he has been on multiple medications in the past and would like to restart a mood stabilizer to help with his anger issues.  Initially discussed starting Trileptal however, contraindicated due to [facility].  Will initiate low-dose of Lamictal 25 mg daily patient to follow-up for medication management/titration.  Education provided  with signs and symptoms related to rash.  He appeared receptive to plan.  Continue partial hospitalization programming.  No concerns related to suicidal or homicidal ideations.  Denies auditory or visual hallucinations.   Visit Diagnosis:    ICD-10-CM   1. 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       Past Psychiatric History:   Past Medical History:  Past Medical History:  Diagnosis Date   Anal condyloma    Anxiety    Blood dyscrasia    HIV   Chronic back pain    Constipation    takes miralax  daily   Depression    on meds   DJD (degenerative joint disease)    Gastric ulcer    GERD (gastroesophageal reflux disease)    Headache(784.0)    Hiatal hernia    HIV infection (HCC)    on meds   Hyperplastic colon polyp    Hypertension    on meds   Internal hemorrhoids    Sickle cell anemia (HCC)     Past Surgical History:  Procedure Laterality Date   COLONOSCOPY  2019   JMP-MAC-prep good-polyps+   ESOPHAGOGASTRODUODENOSCOPY  03/27/2012   Procedure: ESOPHAGOGASTRODUODENOSCOPY (EGD);  Surgeon: Yvetta Herbert, MD;  Location: [facility];  Service: Endoscopy;  Laterality: N/A;   INCISION AND DRAINAGE PERIRECTAL ABSCESS N/A 06/25/2023   Procedure: IRRIGATION AND DEBRIDEMENT PERIRECTAL ABSCESS;  Surgeon: Anda Bamberg, MD;  Location: [facility] OR;  Service: General;  Laterality: N/A;   IRRIGATION AND DEBRIDEMENT ABSCESS N/A 03/13/2015   Procedure: IRRIGATION AND DEBRIDEMENT ABSCESS;  Surgeon:  Jacolyn Matar, MD;  Location: [facility];  Service: General;  Laterality: N/A;   WISDOM TOOTH EXTRACTION      Family Psychiatric History:   Family History:  Family History  Problem Relation Age of Onset   Drug abuse Mother    Alcohol abuse Mother    Esophageal cancer Mother 33       smoker/used ETOH   Alcohol abuse Father    Drug abuse Father    Pneumonia Father    Diabetes Father    Hypertension Sister    Crohn's disease Brother     Colon cancer Maternal Grandfather    Crohn's disease Maternal Grandmother    Cancer Maternal Grandmother        patient unsure of type   Colon polyps Neg Hx    Rectal cancer Neg Hx    Stomach cancer Neg Hx     Social History:  Social History   Socioeconomic History   Marital status: Divorced    Spouse name: Not on file   Number of children: 1   Years of education: Not on file   Highest education level: Some college, no degree  Occupational History   Occupation: Disabled  Tobacco Use   Smoking status: Every Day    Current packs/day: 0.50    Average packs/day: 0.5 packs/day for 34.4 years (17.2 ttl pk-yrs)    Types: Cigarettes    Start date: 1991   Smokeless tobacco: Never   Tobacco comments:    not ready to quit.  Vaping Use   Vaping status: Never Used  Substance and Sexual Activity   Alcohol use: Yes    Comment: socially   Drug use: Yes    Frequency: 7.0 times per week    Types: Marijuana   Sexual activity: Not Currently    Partners: Male    Comment: declined condoms  Other Topics Concern   Not on file  Social History Narrative   Not on file   Social Drivers of Health   Financial Resource Strain: High Risk (08/30/2023)   Overall Financial Resource Strain (CARDIA)    Difficulty of Paying Living Expenses: Very hard  Food Insecurity: Food Insecurity Present (08/30/2023)   Hunger Vital Sign    Worried About Running Out of Food in the Last Year: Sometimes true    Ran Out of Food in the Last Year: Sometimes true  Transportation Needs: No Transportation Needs (09/07/2023)   PRAPARE - Administrator, Civil Service (Medical): No    Lack of Transportation (Non-Medical): No  Physical Activity: Inactive (08/30/2023)   Exercise Vital Sign    Days of Exercise per Week: 0 days    Minutes of Exercise per Session: 0 min  Stress: Stress Concern Present (08/30/2023)   [employer] of Occupational Health - Occupational Stress Questionnaire    Feeling of Stress :  Very much  Social Connections: Socially Isolated (08/30/2023)   Social Connection and Isolation Panel [NHANES]    Frequency of Communication with Friends and Family: More than three times a week    Frequency of Social Gatherings with Friends and Family: Never    Attends Religious Services: Never    Database administrator or Organizations: No    Attends Banker Meetings: Never    Marital Status: Divorced    Allergies:  Allergies  Allergen Reactions   Chantix  [Varenicline ] Other (See Comments)    Bad dreams   Oxycodone  Itching    Metabolic Disorder Labs: Lab Results  Component Value Date   HGBA1C 5.7 04/08/2023   MPG 117 (H) 11/16/2013   No results found for: "PROLACTIN" Lab Results  Component Value Date   CHOL 200 (H) 04/08/2023   TRIG 130 04/08/2023   HDL 53 04/08/2023   CHOLHDL 3.8 04/08/2023   VLDL 28 05/12/2016   LDLCALC 124 (H) 04/08/2023   LDLCALC 173 (H) 10/07/2022   Lab Results  Component Value Date   TSH 2.370 03/18/2023   TSH 1.150 09/01/2017    Therapeutic Level Labs: No results found for: "LITHIUM" No results found for: "VALPROATE" No results found for: "CBMZ"  Current Medications: Current Outpatient Medications  Medication Sig Dispense Refill   lamoTRIgine (LAMICTAL) 25 MG tablet Take 1 tablet (25 mg total) by mouth daily. 25 tablet 0   albuterol  (VENTOLIN  HFA) 108 (90 Base) MCG/ACT inhaler Inhale 2 puffs into the lungs every 6 (six) hours as needed for wheezing or shortness of breath. 18 g 1   AMBULATORY NON FORMULARY MEDICATION Medication Name: Diltiazem  2% gel:Lidocaine  5% - using your index finger, you should apply a small amount of medication inside the rectum up to your first knuckle/joint three times daily x 6-8 weeks. 45 g 1   bictegravir-emtricitabine -tenofovir  AF (BIKTARVY) 50-200-25 MG TABS tablet Take 1 tablet by mouth daily. 30 tablet 11   Eluxadoline  (VIBERZI ) 100 MG TABS TAKE 1 TABLET BY MOUTH 1-2 TIMES EVERY DAY 180 tablet 0    FLUoxetine  (PROZAC ) 40 MG capsule Take 2 capsules (80 mg total) by mouth daily. 180 capsule 0   melatonin 3 MG TABS tablet Take 1 tablet (3 mg total) by mouth at bedtime. 30 tablet 0   nicotine  (NICODERM CQ  - DOSED IN MG/24 HOURS) 21 mg/24hr patch Place 1 patch (21 mg total) onto the skin daily. (Patient not taking: Reported on 11/02/2023) 28 patch 1   propranolol  (INDERAL ) 10 MG tablet Take 1 tablet (10 mg total) by mouth 2 (two) times daily as needed (anxiety). 60 tablet 0   rosuvastatin  (CRESTOR ) 5 MG tablet Take 5 mg by mouth daily.     tadalafil  (CIALIS ) 5 MG tablet Take 1 tablet (5 mg total) by mouth daily. 30 tablet 11   traZODone  (DESYREL ) 100 MG tablet Take 1 tablet (100 mg total) by mouth at bedtime. 30 tablet 0   valACYclovir  (VALTREX ) 500 MG tablet TAKE 1 TABLET BY MOUTH TWICE DAILY (Patient taking differently: Take 500 mg by mouth daily as needed (outbreaks).) 14 tablet 5   Vitamin D , Ergocalciferol , (DRISDOL ) 1.25 MG (50000 UNIT) CAPS capsule TAKE 1 CAPSULE BY MOUTH EVERY 7 DAYS 12 capsule 1   No current facility-administered medications for this visit.     Musculoskeletal: Virtual assessment Psychiatric Specialty Exam: Review of Systems  Psychiatric/Behavioral:  Positive for agitation, behavioral problems, decreased concentration and sleep disturbance.     There were no vitals taken for this visit.There is no height or weight on file to calculate BMI.  General Appearance: Casual  Eye Contact:  Good  Speech:  Clear and Coherent  Volume:  Normal  Mood:  Anxious and Depressed  Affect:  Congruent  Thought Process:  Coherent  Orientation:  Full (Time, Place, and Person)  Thought Content: Logical   Suicidal Thoughts:  No  Homicidal Thoughts:  No  Memory:  Immediate;   Good Recent;   Good  Judgement:  Good  Insight:  Fair  Psychomotor Activity:  Normal  Concentration:  Concentration: Good  Recall:  Good  Fund of Knowledge: Good  Language: Good  Akathisia:  No   Handed:  Right  AIMS (if indicated): not done  Assets:  Communication Skills Desire for Improvement Resilience Social Support  ADL's:  Intact  Cognition: WNL  Sleep:  Good   Screenings: GAD-7    Flowsheet Row Office Visit from 10/12/2023 in [facility] - A Dept Of [place]. [facility] Office Visit from 08/30/2023 in [facility] [place] - A Dept Of [place] [facility]. [facility] Office Visit from 04/08/2023 in [facility] [place] - A Dept Of [place]. [facility] Office Visit from 10/07/2022 in [facility] [place] - A Dept Of [place]. [facility] Video Visit from 03/18/2022 in [facility]  Total GAD-7 Score 19 7 15  0 16      PHQ2-9    Flowsheet Row Counselor from 10/26/2023 in [facility] Counselor from 10/19/2023 in [facility] Office Visit from 10/12/2023 in [facility] - A Dept Of [place]. [facility] Office Visit from 08/30/2023 in [facility] - A Dept Of [place]. [facility] Office Visit from 04/08/2023 in [facility] [place] - A Dept Of [place]. [facility]  PHQ-2 Total Score 6 6 6 2 5   PHQ-9 Total Score 21 23 23 9 13       Flowsheet Row Counselor from 10/26/2023 in [facility] Counselor from 10/19/2023 in [facility] ED to Hosp-Admission (Discharged) from 06/23/2023 in [facility]  SURGICAL  C-SSRS RISK CATEGORY Moderate Risk Moderate Risk No Risk        Assessment and Plan:  Continue partial hospitalization programming-[place] Continue medication as directed  Collaboration of Care: Collaboration of Care: Medication Management AEB initiated Lamictal 25 mg daily  Patient/Guardian was advised Release of Information must be obtained prior to  any record release in order to collaborate their care with an outside provider. Patient/Guardian was advised if they have not already done so to contact the registration department to sign all necessary forms in order for us  to release information regarding their care.   Consent: Patient/Guardian gives verbal consent for treatment and assignment of benefits for services provided during this visit. Patient/Guardian expressed understanding and agreed to proceed.    Levester Reagin, NP 11/08/2023, 11:15 AM

## 2023-11-09 ENCOUNTER — Ambulatory Visit (HOSPITAL_COMMUNITY): Payer: MEDICAID | Admitting: Licensed Clinical Social Worker

## 2023-11-09 DIAGNOSIS — F333 Major depressive disorder, recurrent, severe with psychotic symptoms: Secondary | ICD-10-CM

## 2023-11-09 NOTE — Progress Notes (Signed)
 Spoke with patient via Teams video call, used 2 identifiers to correctly identify patient. States that he is sleeping better with taking his trazodone  and melatonin. Groups are going good even though he finds them difficult at times. Today is the best he has felt in a while. His partner has not yet found and job but he has not been searching since he had 2 weeks paid vacation. They do door dash together every evening. On scale 1-10 as 10 being worst he rates depression at 6 and anxiety at 2. Denies SI/HI or Auditory hallucinations. Continues to see shadows in the corner of his eyes. No side effects from medications. No issues or complaints.

## 2023-11-10 ENCOUNTER — Encounter (HOSPITAL_COMMUNITY): Payer: Self-pay

## 2023-11-10 ENCOUNTER — Ambulatory Visit (HOSPITAL_BASED_OUTPATIENT_CLINIC_OR_DEPARTMENT_OTHER): Payer: MEDICAID | Admitting: Cardiology

## 2023-11-10 ENCOUNTER — Ambulatory Visit (HOSPITAL_COMMUNITY): Payer: MEDICAID

## 2023-11-10 ENCOUNTER — Encounter (HOSPITAL_BASED_OUTPATIENT_CLINIC_OR_DEPARTMENT_OTHER): Payer: Self-pay | Admitting: Cardiology

## 2023-11-10 VITALS — BP 118/80 | HR 70 | Ht 68.0 in | Wt 232.0 lb

## 2023-11-10 DIAGNOSIS — R072 Precordial pain: Secondary | ICD-10-CM

## 2023-11-10 DIAGNOSIS — R0602 Shortness of breath: Secondary | ICD-10-CM | POA: Diagnosis not present

## 2023-11-10 MED ORDER — METOPROLOL TARTRATE 100 MG PO TABS: ORAL_TABLET | ORAL | 0 refills | Status: DC

## 2023-11-10 NOTE — Patient Instructions (Signed)
 Medication Instructions:  TAKE METOPROLOL 100 MG 2 HOURS PRIOR TO CT   *If you need a refill on your cardiac medications before your next appointment, please call your pharmacy*  Lab Work: BMET 1 WEEK PRIOR TO CT   If you have labs (blood work) drawn today and your tests are completely normal, you will receive your results only by: MyChart Message (if you have MyChart) OR A paper copy in the mail If you have any lab test that is abnormal or we need to change your treatment, we will call you to review the results.  Testing/Procedures: Your physician has requested that you have an echocardiogram. Echocardiography is a painless test that uses sound waves to create images of your heart. It provides your doctor with information about the size and shape of your heart and how well your heart's chambers and valves are working. This procedure takes approximately one hour. There are no restrictions for this procedure. Please do NOT wear cologne, perfume, aftershave, or lotions (deodorant is allowed). Please arrive 15 minutes prior to your appointment time.  Please note: We ask at that you not bring children with you during ultrasound (echo/ vascular) testing. Due to room size and safety concerns, children are not allowed in the ultrasound rooms during exams. Our front office staff cannot provide observation of children in our lobby area while testing is being conducted. An adult accompanying a patient to their appointment will only be allowed in the ultrasound room at the discretion of the ultrasound technician under special circumstances. We apologize for any inconvenience.   Your physician has requested that you have cardiac CT. Cardiac computed tomography (CT) is a painless test that uses an x-ray machine to take clear, detailed pictures of your heart. For further information please visit [email or web address] Please follow instruction sheet as given.  Follow-Up: IF YOUR TESTING LOOKS OK FOLLOW UP AS  NEEDED   Other Instructions   Your cardiac CT will be scheduled at one of the below locations:   [facility] [address] [place], [place] [address] [phone]  OR  [facility] [address] [place], [place] [address] [phone]  OR   [facility] [address] [place], [place] [address] [phone]  OR   [facility] [address] [place], [place] [address] [phone]  OR   [facility] [address]  [place], [place] [address] Opening October 24, 2023  If scheduled at [facility], please arrive at the [facility] (Entrance C2) of [facility] 30 minutes prior to test start time. You can use the FREE valet parking offered at entrance C (encouraged to control the heart rate for the test)  Proceed to the [facility] Radiology Department (first floor) to check-in and test prep.   All radiology patients and guests should use entrance C2 at [facility], accessed from [facility], even though the hospital's physical address listed is [address]    If scheduled at the Heart and Vascular Tower at [address], please enter the parking lot using the [address] entrance and use the FREE valet service at the patient drop-off area. Enter the buidling and check-in with registration on the main floor.  If scheduled at [facility] or [facility], please arrive 15 mins early for check-in and test prep.  There is spacious parking and  easy access to the radiology department from the [facility] entrance. Please enter here and check-in with the desk attendant.   If scheduled at [facility], please arrive 30 minutes early for check-in and test prep.  Please follow these instructions carefully (unless  otherwise directed):  An IV will be required for this test and Nitroglycerin  will be given.  Hold all erectile dysfunction medications at least 3 days (72 hrs) prior to test. (Ie viagra , cialis , sildenafil , tadalafil , etc)   On the Night Before the Test: Be sure to Drink plenty of water. Do not consume any caffeinated/decaffeinated beverages or chocolate 12 hours prior to your test. Do not take any antihistamines 12 hours prior to your test.  On the Day of the Test: Drink plenty of water until 1 hour prior to the test. Do not eat any food 1 hour prior to test. You may take your regular medications prior to the test.  Take metoprolol (Lopressor) two hours prior to test. If you take Furosemide /Hydrochlorothiazide/Spironolactone/Chlorthalidone, please HOLD on the morning of the test. Patients who wear a continuous glucose monitor MUST remove the device prior to scanning.      After the Test: Drink plenty of water. After receiving IV contrast, you may experience a mild flushed feeling. This is normal. On occasion, you may experience a mild rash up to 24 hours after the test. This is not dangerous. If this occurs, you can take Benadryl  25 mg, Zyrtec, Claritin, or Allegra and increase your fluid intake. (Patients taking Tikosyn should avoid Benadryl , and may take Zyrtec, Claritin, or Allegra) If you experience trouble breathing, this can be serious. If it is severe call 911 IMMEDIATELY. If it is mild, please call our office.  We will call to schedule your test 2-4 weeks out understanding that some insurance companies will need an authorization prior to the service being performed.   For more information and frequently asked questions, please visit our website : [email or web address]  For non-scheduling related questions, please contact the cardiac imaging nurse navigator should you have any questions/concerns: Cardiac Imaging Nurse Navigators Direct Office Dial: [phone]    For scheduling needs, including cancellations and rescheduling, please call [place], [phone].   Cardiac CT Angiogram A cardiac CT angiogram is a procedure to look at the heart and the area around the heart. It may be done to help find the cause of chest pains or other symptoms of heart disease. During this procedure, a substance called contrast dye is injected into a vein in the arm. The contrast highlights the blood vessels in the area to be checked. A large X-ray machine (CT scanner), then takes detailed pictures of the heart and the surrounding area. The procedure is also sometimes called a coronary CT angiogram, coronary artery scanning, or CTA. A cardiac CT angiogram allows the health care provider to see how well blood is flowing to and from the heart. The provider will be able to see if there are any problems, such as: Blockage or narrowing of the arteries in the heart. Fluid around the heart. Signs of weakness or disease in the muscles, valves, and tissues of the heart. Tell a health care provider about: Any allergies you have. This is especially important if you have had a previous allergic reaction to medicines, contrast dye, or iodine. All medicines you are taking, including vitamins, herbs, eye drops, creams, and over-the-counter medicines. Any bleeding problems you have. Any surgeries you have had. Any medical conditions you have, including kidney  problems or kidney failure. Whether you are pregnant or may be pregnant. Any anxiety disorders, chronic pain, or other conditions you have. These may increase your stress or prevent you from lying still. Any history of abnormal heart rhythms or heart procedures. What are the risks? Your provider will talk with you about risks. These may include: Bleeding. Infection. Allergic reactions to medicines or dyes. Damage to other structures or organs. Kidney damage from the contrast dye. Increased risk of cancer from radiation  exposure. This risk is low. Talk with your provider about: The risks and benefits of testing. How you can receive the lowest dose of radiation. What happens before the procedure? Wear comfortable clothing and remove any jewelry, glasses, dentures, and hearing aids. Follow instructions from your provider about eating and drinking. These may include: 12 hours before the procedure Avoid caffeine. This includes tea, coffee, soda, energy drinks, and diet pills. Drink plenty of water or other fluids that do not have caffeine in them. Being well hydrated can prevent complications. 4-6 hours before the procedure Stop eating and drinking. This will reduce the risk of nausea from the contrast dye. Ask your provider about changing or stopping your regular medicines. These include: Diabetes medicines. Medicines to treat problems with erections (erectile dysfunction). If you have kidney problems, you may need to receive IV hydration before and after the test. What happens during the procedure?  Hair on your chest may need to be removed so that small sticky patches called electrodes can be placed on your chest. These will transmit information that helps to monitor your heart during the procedure. An IV will be inserted into one of your veins. You might be given a medicine to control your heart rate during the procedure. This will help to ensure that good images are obtained. You will be asked to lie on an exam table. This table will slide in and out of the CT machine during the procedure. Contrast dye will be injected into the IV. You might feel warm, or you may get a metallic taste in your mouth. You may be given medicines to relax or dilate the arteries in your heart. If you are allergic to contrast dyes or iodine you may be given medicine before the test to reduce the risk of an allergic reaction. The table that you are lying on will move into the CT machine tunnel for the scan. The person running the  machine will give you instructions while the scans are being done. You may be asked to: Keep your arms above your head. Hold your breath for short periods. Stay very still, even if the table is moving. The procedure may vary among providers and hospitals. What can I expect after the procedure? After your procedure, it is common to have: A metallic taste in your mouth from the contrast dye. A feeling of warmth. A headache from the heart medicine. Follow these instructions at home: Take over-the-counter and prescription medicines only as told by your provider. If you are told, drink enough fluid to keep your pee pale yellow. This will help to flush the contrast dye out of your body. Most people can return to their normal activities right after the procedure. Ask your provider what activities are safe for you. It is up to you to get the results of your procedure. Ask your provider, or the department that is doing the procedure, when your results will be ready. Contact a health care provider if: You have any symptoms of allergy to  the contrast dye. These include: Shortness of breath. Rash or hives. A racing heartbeat. You notice a change in your peeing (urination). This information is not intended to replace advice given to you by your health care provider. Make sure you discuss any questions you have with your health care provider. Document Revised: 01/15/2022 Document Reviewed: 01/15/2022 Elsevier Patient Education  2024 [employer].

## 2023-11-10 NOTE — Progress Notes (Signed)
 Cardiology Office Note:  .   Date:  11/10/2023  ID:  Shawn Brooks, DOB 04-Feb-1976, MRN [id number] PCP: Lawrance Presume, MD  [facility] Providers Cardiologist:  None     History of Present Illness: .   Shawn Brooks is a 48 y.o. male Discussed the use of AI scribe software for clinical note transcription with the patient, who gave verbal consent to proceed.  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  He has experienced shortness of breath for the past 1.5 to 2 years, initially triggered during sexual activity. He describes an episode where he felt like he was going to fall over, with a fast-beating heart, difficulty breathing, and a sensation of his head about to explode.  Since then, he has experienced chest pains and shortness of breath even while sitting or with minimal exertion, such as climbing a flight of stairs. Abstaining from physical activity helps alleviate the symptoms. He has not experienced fainting but recalls an episode at [facility] two to three months ago where he felt woozy and short of breath while standing in line. He nearly fell over when bending to pick up a dropped item but managed to steady himself. He continued to feel dizzy and out of breath while walking to his car.  He is a smoker and has attempted to quit several times, most recently about a month ago with the aid of nicotine  patches. He has also started group therapy.  No known family history of heart disease.     Studies Reviewed: .        Results Creatinine 1.22 ALT 21 LDL 112 hemoglobin 15.2 Reviewed  Echocardiogram 2018 showed normal ejection fraction Stress test in 2018 showed no ischemia Risk Assessment/Calculations:            Physical Exam:   VS:  BP 118/80   Pulse 70   Ht 5\' 8"  (1.727 m)   Wt 232 lb (105.2 kg)   SpO2 98%   BMI 35.28 kg/m    Wt Readings from Last 3 Encounters:  11/10/23 232  lb (105.2 kg)  10/12/23 234 lb (106.1 kg)  09/27/23 232 lb (105.2 kg)    GEN: Well nourished, well developed in no acute distress NECK: No JVD; No carotid bruits CARDIAC: RRR, no murmurs, no rubs, no gallops RESPIRATORY:  Clear to auscultation without rales, wheezing or rhonchi  ABDOMEN: Soft, non-tender, non-distended EXTREMITIES:  No edema; No deformity   ASSESSMENT AND PLAN: .    Assessment and Plan Assessment & Plan Shortness of breath Shortness of breath for 1.5 to 2 years, exacerbated by exertion and associated with chest discomfort. Differential includes cardiac etiology, anxiety, or panic attacks. No family history of heart disease. Echocardiogram and coronary CT scan recommended to evaluate cardiac function and coronary artery status. These non-invasive tests provide detailed information about heart structure and potential stenosis without requiring invasive procedures like heart catheterization. - Order echocardiogram to assess heart function and structure. - Order coronary CT scan to evaluate coronary arteries for plaque or stenosis.  Chest pain Intermittent chest pain associated with shortness of breath. Possible cardiac etiology to be evaluated with imaging studies. Echocardiogram and coronary CT scan recommended to assess heart function and coronary artery status.  Nicotine  dependence Nicotine  dependence with previous unsuccessful quit attempts using nicotine  patches and group therapy. Discussed strategies for smoking cessation, including setting a quit date and using  behavioral techniques. Smoking cessation is crucial for cardiovascular and pulmonary health.  HIV disease Medication reviewed. Continue.            Signed, Dorothye Gathers, MD

## 2023-11-11 ENCOUNTER — Encounter (HOSPITAL_COMMUNITY): Payer: Self-pay

## 2023-11-11 ENCOUNTER — Telehealth (HOSPITAL_COMMUNITY): Payer: Self-pay | Admitting: Professional

## 2023-11-11 ENCOUNTER — Ambulatory Visit (HOSPITAL_COMMUNITY): Payer: MEDICAID

## 2023-11-14 ENCOUNTER — Ambulatory Visit: Payer: MEDICAID | Admitting: Urology

## 2023-11-14 ENCOUNTER — Ambulatory Visit (HOSPITAL_COMMUNITY): Payer: MEDICAID

## 2023-11-14 NOTE — Progress Notes (Deleted)
 Assessment: 1. Ejaculatory disorder   2. Organic impotence   3. 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  I advised him that generally this is not considered a symptom of any serious underlying problem.  He was on some medications that can affect ejaculation as well. Recommend a trial of tadalafil  5 mg daily for management of his ED and BPH with lower urinary tract symptoms.  Prescription sent. Return to office in 1 month.  Chief Complaint:  No chief complaint on file.   History of Present Illness:  Shawn Brooks is a 48 y.o. male who is seen for further evaluation of decreased ejaculation, erectile dysfunction, and lower urinary tract symptoms. He has a history of erectile dysfunction with difficulty achieving and maintaining his erections.  He has previously used Cialis  with some benefit.  He reported that he had not had sex in 2 years following an episode of symptoms associated with his orgasm.  Since that time, he has been masturbating.  He reported a decrease in the amount of ejaculation.  No pain with erection or ejaculation.  No curvature with erections.  He does report occasional a.m. erections.  No decrease in his libido.  He reported lower urinary tract symptoms including frequency, voiding every 60-90 minutes, urgency, nocturia x 3, and straining to void.  No dysuria or gross hematuria. IPSS = 25/5.  PSA 4/25: 1.5.  He was started on tadalafil  5 mg daily.  Portions of the above documentation were copied from a prior visit for review purposes only.   Past Medical History:  Past Medical History:  Diagnosis Date   Anal condyloma    Anxiety    Blood dyscrasia    HIV   Chronic back pain    Constipation    takes miralax  daily   Depression    on meds   DJD (degenerative joint disease)    Gastric ulcer    GERD (gastroesophageal reflux  disease)    Headache(784.0)    Hiatal hernia    HIV infection (HCC)    on meds   Hyperplastic colon polyp    Hypertension    on meds   Internal hemorrhoids    Sickle cell anemia (HCC)     Past Surgical History:  Past Surgical History:  Procedure Laterality Date   COLONOSCOPY  2019   JMP-MAC-prep good-polyps+   ESOPHAGOGASTRODUODENOSCOPY  03/27/2012   Procedure: ESOPHAGOGASTRODUODENOSCOPY (EGD);  Surgeon: Yvetta Herbert, MD;  Location: [facility];  Service: Endoscopy;  Laterality: N/A;   INCISION AND DRAINAGE PERIRECTAL ABSCESS N/A 06/25/2023   Procedure: IRRIGATION AND DEBRIDEMENT PERIRECTAL ABSCESS;  Surgeon: Anda Bamberg, MD;  Location: [facility] OR;  Service: General;  Laterality: N/A;   IRRIGATION AND DEBRIDEMENT ABSCESS N/A 03/13/2015   Procedure: IRRIGATION AND DEBRIDEMENT ABSCESS;  Surgeon: Jacolyn Matar, MD;  Location: [facility];  Service: General;  Laterality: N/A;   WISDOM TOOTH EXTRACTION      Allergies:  Allergies  Allergen Reactions   Chantix  [Varenicline ] Other (See Comments)    Bad dreams   Oxycodone  Itching    Family History:  Family History  Problem Relation Age of Onset   Drug abuse Mother    Alcohol abuse Mother    Esophageal cancer Mother 89       smoker/used ETOH   Alcohol abuse Father    Drug abuse Father  Pneumonia Father    Diabetes Father    Hypertension Sister    Crohn's disease Brother    Colon cancer Maternal Grandfather    Crohn's disease Maternal Grandmother    Cancer Maternal Grandmother        patient unsure of type   Colon polyps Neg Hx    Rectal cancer Neg Hx    Stomach cancer Neg Hx     Social History:  Social History   Tobacco Use   Smoking status: Every Day    Current packs/day: 0.50    Average packs/day: 0.5 packs/day for 34.4 years (17.2 ttl pk-yrs)    Types: Cigarettes    Start date: 12   Smokeless tobacco: Never   Tobacco comments:    not ready to quit.  Vaping Use   Vaping status: Never Used   Substance Use Topics   Alcohol use: Yes    Comment: socially   Drug use: Yes    Frequency: 7.0 times per week    Types: Marijuana    ROS: Constitutional:  Negative for fever, chills, weight loss CV: Negative for chest pain, previous MI, hypertension Respiratory:  Negative for shortness of breath, wheezing, sleep apnea, frequent cough GI:  Negative for nausea, vomiting, bloody stool, GERD  Physical exam: There were no vitals taken for this visit. GENERAL APPEARANCE:  Well appearing, well developed, well nourished, NAD HEENT:  Atraumatic, normocephalic, oropharynx clear NECK:  Supple without lymphadenopathy or thyromegaly ABDOMEN:  Soft, non-tender, no masses EXTREMITIES:  Moves all extremities well, without clubbing, cyanosis, or edema NEUROLOGIC:  Alert and oriented x 3, normal gait, CN II-XII grossly intact MENTAL STATUS:  appropriate BACK:  Non-tender to palpation, No CVAT SKIN:  Warm, dry, and intact   Results: U/A:

## 2023-11-15 ENCOUNTER — Other Ambulatory Visit: Payer: Self-pay

## 2023-11-15 ENCOUNTER — Ambulatory Visit (HOSPITAL_COMMUNITY): Payer: MEDICAID

## 2023-11-15 ENCOUNTER — Ambulatory Visit: Payer: MEDICAID

## 2023-11-15 ENCOUNTER — Encounter (HOSPITAL_COMMUNITY): Payer: Self-pay

## 2023-11-15 ENCOUNTER — Telehealth (HOSPITAL_COMMUNITY): Payer: Self-pay

## 2023-11-15 ENCOUNTER — Other Ambulatory Visit (HOSPITAL_COMMUNITY): Payer: Self-pay | Admitting: Student

## 2023-11-15 DIAGNOSIS — F333 Major depressive disorder, recurrent, severe with psychotic symptoms: Secondary | ICD-10-CM

## 2023-11-15 NOTE — Telephone Encounter (Addendum)
 Hello,        Pt is requesting for a refill of trazadone, and HYDROxyzine  to be sent to pharmacy and Propranolol .   These were all filled last 10/14/2023  This pt

## 2023-11-15 NOTE — Telephone Encounter (Signed)
 Correction: He began PHP today.

## 2023-11-15 NOTE — Telephone Encounter (Signed)
 Patient begins PHP tomorrow.  When he is seen by the provider for his admission assessment, they will discuss medications and can send refills/make any medication adjustments for him at that time.  Dr. Lorna Rose

## 2023-11-16 ENCOUNTER — Ambulatory Visit (HOSPITAL_COMMUNITY): Payer: MEDICAID | Admitting: Licensed Clinical Social Worker

## 2023-11-16 ENCOUNTER — Telehealth (HOSPITAL_COMMUNITY): Payer: Self-pay | Admitting: Licensed Clinical Social Worker

## 2023-11-16 DIAGNOSIS — F333 Major depressive disorder, recurrent, severe with psychotic symptoms: Secondary | ICD-10-CM | POA: Diagnosis not present

## 2023-11-17 ENCOUNTER — Ambulatory Visit (HOSPITAL_COMMUNITY): Payer: MEDICAID

## 2023-11-17 ENCOUNTER — Encounter (HOSPITAL_COMMUNITY): Payer: Self-pay

## 2023-11-17 NOTE — Progress Notes (Signed)
 Virtual Visit via Telephone Note  I connected with Shawn Brooks on 11/17/23 at  9:00 AM EDT by telephone and verified that I am speaking with the correct person using two identifiers.  Location: Patient: telephonic assessment   Provider: Office   I discussed the limitations, risks, security and privacy concerns of performing an evaluation and management service by telephone and the availability of in person appointments. I also discussed with the patient that there may be a patient responsible charge related to this service. The patient expressed understanding and agreed to proceed.    I discussed the assessment and treatment plan with the patient. The patient was provided an opportunity to ask questions and all were answered. The patient agreed with the plan and demonstrated an understanding of the instructions.   The patient was advised to call back or seek an in-person evaluation if the symptoms worsen or if the condition fails to improve as anticipated.  I provided 10 minutes of non-face-to-face time during this encounter.   Levester Reagin, NP   [facility]- [facility]  Discharge Summary  Shawn Brooks [id number]  Admission date: 10/25/2023 Discharge date: 11/17/2023  Reason for admission: Per admission assessment note: Shawn Brooks 48 year old African-American male presents after referral by psychiatrist Leia Pun.  Yesenia reports multiple times related to worsening depression with passive suicidal ideations denying plan or intent.  Reports feelings of worthlessness, lack of motivation and memory issues.  Reports plans to follow-up with neurology later on this month.Rocko denied a diagnosis related to traumatic brain injuries but does account for multiple denies falls landing on the back of his head 20+ years ago which may be attributing to some of his symptoms."    Progress in Program Toward Treatment Goals: Not  Progressing patient was seen and evaluated weekly by this provider.  Reports he did not feel group setting was helpful.  Limited participation.  Continues to have concerns related to mood lability.  And increased depression.  No concerns related to suicidal or homicidal ideations.  He reports multiple medication adjustments in the past.  He has a follow-up with attending psychiatrist Leia Pun, MD.   Progress (rationale): Take all of you medications as prescribed by your mental healthcare provider.  Report any adverse effects and reactions from your medications to your outpatient provider promptly.  Do not engage in alcohol and or illegal drug use while on prescription medicines. Keep all scheduled appointments. This is to ensure that you are getting refills on time and to avoid any interruption in your medication.  If you are unable to keep an appointment call to reschedule.  Be sure to follow up with resources and follow ups given. In the event of worsening symptoms call the crisis hotline, 911, and or go to the nearest emergency department for appropriate evaluation and treatment of symptoms. Follow-up with your primary care provider for your medical issues, concerns and or health care needs.    Collaboration of Care: Medication Management AEB initiated Tegretol 150 mg p.o. twice daily while participating in intensive outpatient programming.  Patient/Guardian was advised Release of Information must be obtained prior to any record release in order to collaborate their care with an outside provider. Patient/Guardian was advised if they have not already done so to contact the registration department to sign all necessary forms in order for us  to release information regarding their care.   Consent: Patient/Guardian gives verbal consent for treatment and assignment of benefits for services provided  during this visit. Patient/Guardian expressed understanding and agreed to proceed.   [facility]  THERAPIST 11/17/2023

## 2023-11-18 ENCOUNTER — Ambulatory Visit (HOSPITAL_COMMUNITY): Payer: MEDICAID

## 2023-11-22 ENCOUNTER — Ambulatory Visit (HOSPITAL_COMMUNITY): Payer: MEDICAID

## 2023-11-22 NOTE — Psych (Signed)
 Virtual Visit via Video Note  I connected with Shawn Brooks on 10/27/23 at  9:00 AM EDT by a video enabled telemedicine application and verified that I am speaking with the correct person using two identifiers.  Location: Patient: patient home Provider: clinical home office   I discussed the limitations of evaluation and management by telemedicine and the availability of in person appointments. The patient expressed understanding and agreed to proceed  I discussed the assessment and treatment plan with the patient. The patient was provided an opportunity to ask questions and all were answered. The patient agreed with the plan and demonstrated an understanding of the instructions.   The patient was advised to call back or seek an in-person evaluation if the symptoms worsen or if the condition fails to improve as anticipated.  Pt was provided 240 minutes of non-face-to-face time during this encounter.   Ethelle Herb, LCSW   [facility] PHP THERAPIST PROGRESS NOTE  Shawn Brooks [id number]  Session Time: 9:00 - 10:00  Participation Level: Active  Behavioral Response: CasualAlertDepressed  Type of Therapy: Group Therapy  Treatment Goals addressed: Coping  Progress Towards Goals: Initial  Interventions: CBT, DBT, Supportive, and Reframing  Summary: Shawn Brooks is a 48 y.o. male who presents with depression symptoms.  Clinician led check-in regarding current stressors and situation, and review of patient completed daily inventory. Clinician utilized active listening and empathetic response and validated patient emotions. Clinician facilitated processing group on pertinent issues.   Therapist Response: Patient arrived within time allowed. Patient rates his mood at a 5 on a scale of 1-10 with 10 being best. Pt states he feels "depressed." Pt states he slept 8 hours and ate 2x. Pt reports he spent most of the day in bed yesterday and struggled after exerting himself in group on  Tuesday. Pt reports being in the group space and thinking about his mental space was draining to him. Pt reports struggles with ADL's. Pt able to process. Pt engaged in discussion.          Session Time: 10:00 am - 11:00 am   Participation Level: Active   Behavioral Response: CasualAlertDepressed   Type of Therapy: Group Therapy   Treatment Goals addressed: Coping   Progress Towards Goals: Progressing   Interventions: CBT, DBT, Solution Focused, Strength-based, Supportive, and Reframing   Summary: Cln led discussion on rest. Cln discussed the need to rewrite social story of rest being earned or last on the list and assert that rest is productive. Group members discussed barriers to allowing themselves rest and the negative self-talk involved.  Cln worked with group to thought challenge and apply self-coaching strategies.   Therapist Response:  Pt engaged in discussion and reports struggle with allowing themselves rest.          Session Time: 11:00 -12:00   Participation Level: Active   Behavioral Response: CasualAlertDepressed   Type of Therapy: Group Therapy   Treatment Goals addressed: Coping   Progress Towards Goals: Progressing   Interventions: CBT, DBT, Solution Focused, Strength-based, Supportive, and Reframing   Summary: Cln led discussion on communication struggles. Group shared ways in which communication is difficult for them and what are typical barriers. Cln reminded pt's of assertiveness skills and "I" statements. Cln encouraged pt's to consider making notes for points they want to address in a discussion and/or script out what they want to say as a way to decrease anxiety and likeliness that they will get off topic.    Therapist  Response: Pt engaged in discussion and reports willingness to apply communication strategies.           Session Time: 12:00 -1:00   Participation Level: Active   Behavioral Response: CasualAlertDepressed   Type of Therapy:  Group therapy   Treatment Goals addressed: Coping   Progress Towards Goals: Progressing   Interventions: OT group   Summary: 12:00 - 12:50: Occupational Therapy group with cln E. Hollan.  12:50 - 1:00 Clinician assessed for immediate needs, medication compliance and efficacy, and safety concerns.   Therapist Response: 12:00 - 12:50: Pt participated 12:50 - 1:00 pm: At check-out, patient reports no immediate concerns. Patient demonstrates progress as evidenced by continued engagement and responsiveness to treatment. Patient denies SI/HI/self-harm thoughts at the end of group.      Suicidal/Homicidal: Nowithout intent/plan  Plan: Pt will continue in PHP while working to decrease depression symptoms, increase daily functioning, and increase ability to manage symptoms in a healthy manner.   Collaboration of Care: Medication Management AEB Shawn Lewis, NP  Patient/Guardian was advised Release of Information must be obtained prior to any record release in order to collaborate their care with an outside provider. Patient/Guardian was advised if they have not already done so to contact the registration department to sign all necessary forms in order for us  to release information regarding their care.   Consent: Patient/Guardian gives verbal consent for treatment and assignment of benefits for services provided during this visit. Patient/Guardian expressed understanding and agreed to proceed.   Diagnosis: Severe episode of recurrent major depressive disorder, with psychotic features (HCC) [F33.3]    1. Severe episode of recurrent major depressive disorder, with psychotic features (HCC)       Ethelle Herb, LCSW

## 2023-11-23 ENCOUNTER — Ambulatory Visit (HOSPITAL_COMMUNITY): Payer: MEDICAID

## 2023-11-24 ENCOUNTER — Ambulatory Visit (HOSPITAL_COMMUNITY): Payer: MEDICAID

## 2023-11-24 ENCOUNTER — Ambulatory Visit (INDEPENDENT_AMBULATORY_CARE_PROVIDER_SITE_OTHER): Payer: MEDICAID | Admitting: Student

## 2023-11-24 DIAGNOSIS — F333 Major depressive disorder, recurrent, severe with psychotic symptoms: Secondary | ICD-10-CM

## 2023-11-24 DIAGNOSIS — G4752 REM sleep behavior disorder: Secondary | ICD-10-CM

## 2023-11-24 MED ORDER — TRAZODONE HCL 100 MG PO TABS: 50.0000 mg | ORAL_TABLET | Freq: Every evening | ORAL | 0 refills | Status: DC | PRN

## 2023-11-24 MED ORDER — LAMOTRIGINE 25 MG PO TABS: 25.0000 mg | ORAL_TABLET | Freq: Every day | ORAL | 0 refills | Status: DC

## 2023-11-24 NOTE — Psych (Signed)
 Virtual Visit via Video Note  I connected with Shawn Brooks on 11/01/23 at  9:00 AM EDT by a video enabled telemedicine application and verified that I am speaking with the correct person using two identifiers.  Location: Patient: patient home Provider: clinical home office   I discussed the limitations of evaluation and management by telemedicine and the availability of in person appointments. The patient expressed understanding and agreed to proceed  I discussed the assessment and treatment plan with the patient. The patient was provided an opportunity to ask questions and all were answered. The patient agreed with the plan and demonstrated an understanding of the instructions.   The patient was advised to call back or seek an in-person evaluation if the symptoms worsen or if the condition fails to improve as anticipated.  Pt was provided 240 minutes of non-face-to-face time during this encounter.   Ethelle Herb, LCSW   [facility] PHP THERAPIST PROGRESS NOTE  Shawn Brooks [id number]  Session Time: 9:00 - 10:00  Participation Level: Active  Behavioral Response: CasualAlertDepressed  Type of Therapy: Group Therapy  Treatment Goals addressed: Coping  Progress Towards Goals: Progressing  Interventions: CBT, DBT, Supportive, and Reframing  Summary: Shawn Brooks is a 48 y.o. male who presents with depression symptoms.  Clinician led check-in regarding current stressors and situation, and review of patient completed daily inventory. Clinician utilized active listening and empathetic response and validated patient emotions. Clinician facilitated processing group on pertinent issues.   Therapist Response: Patient arrived within time allowed. Patient rates his mood at a 6.5 on a scale of 1-10 with 10 being best. Pt states he feels "okay." Pt states he slept 8.5 hours and ate 2x. Pt reports he's tried melatonin over the weekend and it is mainly working. Pt reports he is  sleeping better but remains sleepy. Pt states napping most afternoons. Pt reports continued difficulties leaving the house without partner. Pt able to process. Pt engaged in discussion.          Session Time: 10:00 am - 11:00 am   Participation Level: Active   Behavioral Response: CasualAlertDepressed   Type of Therapy: Group Therapy   Treatment Goals addressed: Coping   Progress Towards Goals: Progressing   Interventions: CBT, DBT, Solution Focused, Strength-based, Supportive, and Reframing   Summary: Cln introduced topic of boundaries. Cln discussed how boundaries inform our relationships and affect self-esteem and personal agency. Group discussed the three types of boundaries: rigid, porous, and healthy and when each type is most helpful/harmful.    Therapist Response:  Pt engaged in discussion and is able to identify each type in their life.              Session Time: 11:00 -12:00   Participation Level: Active   Behavioral Response: CasualAlertDepressed   Type of Therapy: Group Therapy   Treatment Goals addressed: Coping   Progress Towards Goals: Progressing   Interventions: CBT, DBT, Solution Focused, Strength-based, Supportive, and Reframing   Summary: Cln continued topic of boundaries. Cln discussed the different ways boundaries present: physical, emotional, intellectual, sexual, material, and time. Group talked about the ways in which each type presents for them and is a struggle.    Therapist Response: Pt engaged in discussion and identified struggles with each type.             Session Time: 12:00 -1:00   Participation Level: Active   Behavioral Response: CasualAlertDepressed   Type of Therapy: Group therapy   Treatment Goals  addressed: Coping   Progress Towards Goals: Progressing   Interventions: OT group   Summary: 12:00 - 12:50: Occupational Therapy group with cln E. Hollan.  12:50 - 1:00 Clinician assessed for immediate needs, medication  compliance and efficacy, and safety concerns.   Therapist Response: 12:00 - 12:50: Pt participated 12:50 - 1:00 pm: At check-out, patient reports no immediate concerns. Patient demonstrates progress as evidenced by continued engagement and responsiveness to treatment. Patient denies SI/HI/self-harm thoughts at the end of group.      Suicidal/Homicidal: Nowithout intent/plan  Plan: Pt will continue in PHP while working to decrease depression symptoms, increase daily functioning, and increase ability to manage symptoms in a healthy manner.   Collaboration of Care: Medication Management AEB T Lewis, NP  Patient/Guardian was advised Release of Information must be obtained prior to any record release in order to collaborate their care with an outside provider. Patient/Guardian was advised if they have not already done so to contact the registration department to sign all necessary forms in order for us  to release information regarding their care.   Consent: Patient/Guardian gives verbal consent for treatment and assignment of benefits for services provided during this visit. Patient/Guardian expressed understanding and agreed to proceed.   Diagnosis: Severe episode of recurrent major depressive disorder, with psychotic features (HCC) [F33.3]    1. Severe episode of recurrent major depressive disorder, with psychotic features (HCC)   2. REM sleep behavior disorder       Ethelle Herb, LCSW
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
## 2023-11-24 NOTE — Psych (Signed)
 Virtual Visit via Video Note  I connected with Lorence Epimenio Hasten on 11/02/23 at  9:00 AM EDT by a video enabled telemedicine application and verified that I am speaking with the correct person using two identifiers.  Location: Patient: patient home Provider: clinical home office   I discussed the limitations of evaluation and management by telemedicine and the availability of in person appointments. The patient expressed understanding and agreed to proceed  I discussed the assessment and treatment plan with the patient. The patient was provided an opportunity to ask questions and all were answered. The patient agreed with the plan and demonstrated an understanding of the instructions.   The patient was advised to call back or seek an in-person evaluation if the symptoms worsen or if the condition fails to improve as anticipated.  Pt was provided 240 minutes of non-face-to-face time during this encounter.   Ethelle Herb, LCSW   [facility] PHP THERAPIST PROGRESS NOTE  Elzia DARYEL KENNETH [id number]  Session Time: 9:00 - 10:00  Participation Level: Active  Behavioral Response: CasualAlertDepressed  Type of Therapy: Group Therapy  Treatment Goals addressed: Coping  Progress Towards Goals: Progressing  Interventions: CBT, DBT, Supportive, and Reframing  Summary: Charbel ZACARI STIFF is a 48 y.o. male who presents with depression symptoms.  Clinician led check-in regarding current stressors and situation, and review of patient completed daily inventory. Clinician utilized active listening and empathetic response and validated patient emotions. Clinician facilitated processing group on pertinent issues.   Therapist Response: Patient arrived within time allowed. Patient rates his mood at a 5 on a scale of 1-10 with 10 being best. Pt states he feels "not too good." Pt states he slept 7 hours and ate 2x. Pt reports his partner was fired yesterday and it is spiking pt's anxiety. Pt reports stress  re: finances, living situation, and overall instability. Pt able to process. Pt engaged in discussion.          Session Time: 10:00 am - 11:00 am   Participation Level: Active   Behavioral Response: CasualAlertDepressed   Type of Therapy: Group Therapy   Treatment Goals addressed: Coping   Progress Towards Goals: Progressing   Interventions: CBT, DBT, Solution Focused, Strength-based, Supportive, and Reframing   Summary: Cln led processing group for pt's current struggles. Group members shared stressors and provided support and feedback. Cln brought in topics of boundaries, healthy relationships, and unhealthy thought processes to inform discussion.     Therapist Response: Pt able to process and provide support to group.              Session Time: 11:00 -12:00   Participation Level: Active   Behavioral Response: CasualAlertDepressed   Type of Therapy: Group Therapy   Treatment Goals addressed: Coping   Progress Towards Goals: Progressing   Interventions: Strength-based, Supportive, and Reframing   Summary: Chaplaincy group with K. Claussen   Therapist Response: Pt participated and engaged in discussion.            Session Time: 12:00 -1:00   Participation Level: Active   Behavioral Response: CasualAlertDepressed   Type of Therapy: Group therapy   Treatment Goals addressed: Coping   Progress Towards Goals: Progressing   Interventions: OT group   Summary: 12:00 - 12:50: Occupational Therapy group with cln E. Hollan.  12:50 - 1:00 Clinician assessed for immediate needs, medication compliance and efficacy, and safety concerns.   Therapist Response: 12:00 - 12:50: Pt participated 12:50 - 1:00 pm: At check-out, patient reports no  immediate concerns. Patient demonstrates progress as evidenced by continued engagement and responsiveness to treatment. Patient denies SI/HI/self-harm thoughts at the end of group.      Suicidal/Homicidal: Nowithout  intent/plan  Plan: Pt will continue in PHP while working to decrease depression symptoms, increase daily functioning, and increase ability to manage symptoms in a healthy manner.   Collaboration of Care: Medication Management AEB T Lewis, NP  Patient/Guardian was advised Release of Information must be obtained prior to any record release in order to collaborate their care with an outside provider. Patient/Guardian was advised if they have not already done so to contact the registration department to sign all necessary forms in order for us  to release information regarding their care.   Consent: Patient/Guardian gives verbal consent for treatment and assignment of benefits for services provided during this visit. Patient/Guardian expressed understanding and agreed to proceed.   Diagnosis: Severe episode of recurrent major depressive disorder, with psychotic features (HCC) [F33.3]    1. Severe episode of recurrent major depressive disorder, with psychotic features (HCC)        Ethelle Herb, LCSW

## 2023-11-24 NOTE — Progress Notes (Signed)
 BH MD Outpatient Progress Note  11/24/2023 8:05 AM Shawn Brooks  MRN:  [id number]  Assessment:  Shawn Brooks presents for follow-up evaluation in-person. Today, patient reports improvement in mood since completion of PHP. He suspects some of his symptoms were secondary to very high trazodone  dose.  He reports sleeping fairly well with melatonin with the exception of continuing to reenactment his dreams resulting in occasionally getting his partner.  Patient was started on lamotrigine  as an adjunct treatment for his major depressive disorder as well as irritability. This mood stabilizer was not started for suspected bipolar disorder.  Will continue evaluate the necessity of this medication in addressing his mood disorder. Plan to decrease trazodone  to 50 mg and increase melatonin dose to 10 mg (patient was already taking 6 mg). REM sleep behavior disorder requires 6-18 mg on average so will adjust accordingly. Advised patient to follow up with neurologist to assess if there is any other etiologies to explain is dream reenactment beyond being on SSRI.  Plan to screen for PTSD in subsequent visits given increased startle reflex, hypervigilance, and negative alteration in mood.   Risk Assessment: A suicide and violence risk assessment was performed as part of this evaluation. There patient is deemed to be at chronic elevated risk for self-harm/suicide given the following factors: suicidal ideation or threats without a plan, feelings of hopelessness, lack of social support, and current substance abuse. These risk factors are mitigated by the following factors: no known access to weapons or firearms, no history of violence, motivation for treatment, utilization of positive coping skills, expresses purpose for living, current treatment compliance, effective problem solving skills, and safe housing. The patient is deemed to be at chronic elevated risk for violence given the following factors: homicidal  ideation and current substance abuse. These risk factors are mitigated by the following factors: no known history of violence towards others, no known history of threats of harm towards others, and low impulsivity. There is no acute risk for suicide or violence at this time. The patient was educated about relevant modifiable risk factors including following recommendations for treatment of psychiatric illness and abstaining from substance abuse.  While future psychiatric events cannot be accurately predicted, the patient does not currently require  acute inpatient psychiatric care and does not currently meet [facility]  involuntary commitment criteria.    Identifying Information: Shawn Brooks is a 48 y.o. male with historical diagnosis of MDD, social anxiety disorder, HIV on HAART threapy, HLD, and Vitamin D  deficiency who is an established patient with [facility] for medication management. He reports progressively worsening memory problems and chronically struggles with insomnia. Memory problems currently hypothesized secondary to both insufficient treatment of MDD and possibly cannabis use although uncertain if more is contributing as his memory problems do not correlate with worsening depression. Sleep study did not show signs of OSA. Diagnosis of MDD made based on patient's depressed mood, anhedonia, poor sleep, poor appetite, inattention. MOCA performed 06/02/23 was 27/30.  Plan:  # Major depressive disorder-recurrent episode, severe with psychotic features Past medication trials: Zoloft  Status of problem: stable Interventions: -- Continue Prozac  80 mg daily  #Social Anxiety Disorder Unclear etiology. 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 -Continue propranolol  10 mg twice daily   # Hypersomnia Past medication trials:  Status of problem: stable Interventions: -- Decrease trazodone  to 50-100 mg nightly -- sleep study   REM Sleep Behavior Disorder -stop  mirtazapine  -increase melatonin to 10 mg at bedtime -referral sleep  to sleep medicine  Cannabis Use Disorder Substance induced mood disorder -advise decrease use   # EtOH use disorder, in early remission Past medication trials: none Status of problem: improving Interventions: -- 3-4 beers per month -- Psychoeducation provided regarding risks of dependency, tolerance, and withdrawal as well as safe limits of use; patient expresses intent to continue to reduce use   # Memory problems -Unclear etiology but differential includes pseudodementia 2/2 MDD, wernicke's encepahlopathy versus HIV dementia versus iatrogenic   Folate deficiency Vitamin D  deficiency -Vitamin D  supplement -Multivitamin with folate  Patient was given contact information for behavioral health clinic and was instructed to call 911 for emergencies.   Subjective:  Chief Complaint:  Medication management  Interval History:  Patient reports overall doing much better.  He reports that after 1-1/2 to 2 weeks that in [facility], he started to feel much better in terms of his mood.  He reports his current issue remains his sleep as he continues to reconnect his dreams despite ceasing the mirtazapine .  He reports eating fairly well.  He had requested that [facility] provider start him on mood stabilizer due to singular episode of reactive irritability. He has not displayed this degree of anger in several years which is why it terrified him.  He denies any acute somatic complaints from initiation of lamotrigine .  He does report the melatonin is helping with his sleep but reports he does not feel that it dramatically changes his dream reenactment.  Patient has to follow-up with neurology in approximately 2 weeks and will discuss his neurological symptoms with neurologist at that time.  He denies SI/HI/AVH.  Visit Diagnosis:  No diagnosis found.        Past Psychiatric History:  Diagnoses: Historical diagnosis of bipolar 1  disorder Medication trials: Zoloft  Substance use:             -- Etoh: Used to have 6 beers daily; denies history of withdrawal; now on 1 beer every ~2 weeks             -- Cannabis: Daily multiple blunts             -- Denies use of illicit drugs  Past Medical History:  Past Medical History:  Diagnosis Date   Anal condyloma    Anxiety    Blood dyscrasia    HIV   Chronic back pain    Constipation    takes miralax  daily   Depression    on meds   DJD (degenerative joint disease)    Gastric ulcer    GERD (gastroesophageal reflux disease)    Headache(784.0)    Hiatal hernia    HIV infection (HCC)    on meds   Hyperplastic colon polyp    Hypertension    on meds   Internal hemorrhoids    Sickle cell anemia (HCC)     Past Surgical History:  Procedure Laterality Date   COLONOSCOPY  2019   JMP-MAC-prep good-polyps+   ESOPHAGOGASTRODUODENOSCOPY  03/27/2012   Procedure: ESOPHAGOGASTRODUODENOSCOPY (EGD);  Surgeon: Yvetta Herbert, MD;  Location: [facility];  Service: Endoscopy;  Laterality: N/A;   INCISION AND DRAINAGE PERIRECTAL ABSCESS N/A 06/25/2023   Procedure: IRRIGATION AND DEBRIDEMENT PERIRECTAL ABSCESS;  Surgeon: Anda Bamberg, MD;  Location: [facility] OR;  Service: General;  Laterality: N/A;   IRRIGATION AND DEBRIDEMENT ABSCESS N/A 03/13/2015   Procedure: IRRIGATION AND DEBRIDEMENT ABSCESS;  Surgeon: Jacolyn Matar, MD;  Location: [facility];  Service: General;  Laterality: N/A;  WISDOM TOOTH EXTRACTION      Family History:  Family History  Problem Relation Age of Onset   Drug abuse Mother    Alcohol abuse Mother    Esophageal cancer Mother 11       smoker/used ETOH   Alcohol abuse Father    Drug abuse Father    Pneumonia Father    Diabetes Father    Hypertension Sister    Crohn's disease Brother    Colon cancer Maternal Grandfather    Crohn's disease Maternal Grandmother    Cancer Maternal Grandmother        patient unsure of type   Colon polyps Neg Hx     Rectal cancer Neg Hx    Stomach cancer Neg Hx     Social History:  Social History   Socioeconomic History   Marital status: Divorced    Spouse name: Not on file   Number of children: 1   Years of education: Not on file   Highest education level: Some college, no degree  Occupational History   Occupation: Disabled  Tobacco Use   Smoking status: Every Day    Current packs/day: 0.50    Average packs/day: 0.5 packs/day for 34.4 years (17.2 ttl pk-yrs)    Types: Cigarettes    Start date: 1991   Smokeless tobacco: Never   Tobacco comments:    not ready to quit.  Vaping Use   Vaping status: Never Used  Substance and Sexual Activity   Alcohol use: Yes    Comment: socially   Drug use: Yes    Frequency: 7.0 times per week    Types: Marijuana   Sexual activity: Not Currently    Partners: Male    Comment: declined condoms  Other Topics Concern   Not on file  Social History Narrative   Not on file   Social Drivers of Health   Financial Resource Strain: High Risk (08/30/2023)   Overall Financial Resource Strain (CARDIA)    Difficulty of Paying Living Expenses: Very hard  Food Insecurity: Food Insecurity Present (08/30/2023)   Hunger Vital Sign    Worried About Running Out of Food in the Last Year: Sometimes true    Ran Out of Food in the Last Year: Sometimes true  Transportation Needs: No Transportation Needs (09/07/2023)   PRAPARE - Administrator, Civil Service (Medical): No    Lack of Transportation (Non-Medical): No  Physical Activity: Inactive (08/30/2023)   Exercise Vital Sign    Days of Exercise per Week: 0 days    Minutes of Exercise per Session: 0 min  Stress: Stress Concern Present (08/30/2023)   [employer] of Occupational Health - Occupational Stress Questionnaire    Feeling of Stress : Very much  Social Connections: Socially Isolated (08/30/2023)   Social Connection and Isolation Panel [NHANES]    Frequency of Communication with Friends and Family:  More than three times a week    Frequency of Social Gatherings with Friends and Family: Never    Attends Religious Services: Never    Database administrator or Organizations: No    Attends Banker Meetings: Never    Marital Status: Divorced    Allergies:  Allergies  Allergen Reactions   Chantix  [Varenicline ] Other (See Comments)    Bad dreams   Oxycodone  Itching    Current Medications: Current Outpatient Medications  Medication Sig Dispense Refill   albuterol  (VENTOLIN  HFA) 108 (90 Base) MCG/ACT inhaler Inhale 2 puffs into the  lungs every 6 (six) hours as needed for wheezing or shortness of breath. 18 g 1   AMBULATORY NON FORMULARY MEDICATION Medication Name: Diltiazem  2% gel:Lidocaine  5% - using your index finger, you should apply a small amount of medication inside the rectum up to your first knuckle/joint three times daily x 6-8 weeks. 45 g 1   bictegravir-emtricitabine -tenofovir  AF (BIKTARVY) 50-200-25 MG TABS tablet Take 1 tablet by mouth daily. 30 tablet 11   Eluxadoline  (VIBERZI ) 100 MG TABS TAKE 1 TABLET BY MOUTH 1-2 TIMES EVERY DAY 180 tablet 0   FLUoxetine  (PROZAC ) 40 MG capsule Take 2 capsules (80 mg total) by mouth daily. 180 capsule 0   lamoTRIgine  (LAMICTAL ) 25 MG tablet Take 1 tablet (25 mg total) by mouth daily. (Patient not taking: Reported on 11/10/2023) 25 tablet 0   metoprolol  tartrate (LOPRESSOR ) 100 MG tablet Take 1 tablet (100mg ) TWO hours prior to CT scan 1 tablet 0   nicotine  (NICODERM CQ  - DOSED IN MG/24 HOURS) 21 mg/24hr patch Place 1 patch (21 mg total) onto the skin daily. (Patient not taking: Reported on 11/10/2023) 28 patch 1   propranolol  (INDERAL ) 10 MG tablet Take 1 tablet (10 mg total) by mouth 2 (two) times daily as needed (anxiety). 60 tablet 0   rosuvastatin  (CRESTOR ) 5 MG tablet Take 5 mg by mouth daily.     tadalafil  (CIALIS ) 5 MG tablet Take 1 tablet (5 mg total) by mouth daily. 30 tablet 11   traZODone  (DESYREL ) 100 MG tablet Take 1  tablet (100 mg total) by mouth at bedtime. 30 tablet 0   valACYclovir  (VALTREX ) 500 MG tablet TAKE 1 TABLET BY MOUTH TWICE DAILY (Patient taking differently: Take 500 mg by mouth daily as needed (outbreaks).) 14 tablet 5   Vitamin D , Ergocalciferol , (DRISDOL ) 1.25 MG (50000 UNIT) CAPS capsule TAKE 1 CAPSULE BY MOUTH EVERY 7 DAYS 12 capsule 1   No current facility-administered medications for this visit.    ROS: Review of Systems  Objective:  Psychiatric Specialty Exam: There were no vitals taken for this visit.There is no height or weight on file to calculate BMI.  General Appearance: Casual  Eye Contact:  Good  Speech:  Clear and Coherent and Normal Rate  Volume:  Normal  Mood:  Depressed  Affect:  Depressed and Tearful  Thought Process:  Coherent, Goal Directed, and Linear  Orientation:  Full (Time, Place, and Person)  Thought Content: Logical   Suicidal Thoughts:  No  Homicidal Thoughts:  No  Memory:  Remote;   Good  Judgment:  Fair  Insight:  Fair  Psychomotor Activity:  Normal  Concentration:  Concentration: Good and Attention Span: Good              Assets:  Communication Skills Desire for Improvement Financial Resources/Insurance Housing Intimacy Leisure Time Physical Health Resilience Social Support Talents/Skills Transportation Vocational/Educational  ADL's:  Intact  Cognition: WNL  Sleep:  Good   PE: General: well-appearing; no acute distress  Pulm: no increased work of breathing on room air  Strength & Muscle Tone: within normal limits Neuro: no focal neurological deficits observed  Gait & Station: normal  Metabolic Disorder Labs: Lab Results  Component Value Date   HGBA1C 5.7 04/08/2023   MPG 117 (H) 11/16/2013   No results found for: "PROLACTIN" Lab Results  Component Value Date   CHOL 200 (H) 04/08/2023   TRIG 130 04/08/2023   HDL 53 04/08/2023   CHOLHDL 3.8 04/08/2023   VLDL 28 05/12/2016  LDLCALC 124 (H) 04/08/2023   LDLCALC  173 (H) 10/07/2022   Lab Results  Component Value Date   TSH 2.370 03/18/2023   TSH 1.150 09/01/2017    Therapeutic Level Labs: No results found for: "LITHIUM" No results found for: "VALPROATE" No results found for: "CBMZ"  Screenings: GAD-7    Flowsheet Row Office Visit from 10/12/2023 in [facility] - A Dept Of [place]. [facility] Office Visit from 08/30/2023 in [facility] - A Dept Of [place] [facility]. [facility] Office Visit from 04/08/2023 in [facility] [place] - A Dept Of [place]. [facility] Office Visit from 10/07/2022 in [facility] [place] - A Dept Of [place] [facility]. [facility] Video Visit from 03/18/2022 in [facility]  Total GAD-7 Score 19 7 15  0 16      PHQ2-9    Flowsheet Row Counselor from 10/26/2023 in [facility] Counselor from 10/19/2023 in [facility] Office Visit from 10/12/2023 in [facility] - A Dept Of [place]. [facility] Office Visit from 08/30/2023 in [facility] - A Dept Of [place] [facility]. [facility] Office Visit from 04/08/2023 in [facility] [place] - A Dept Of [place]. [facility]  PHQ-2 Total Score 6 6 6 2 5   PHQ-9 Total Score 21 23 23 9 13       Flowsheet Row Counselor from 10/26/2023 in [facility] Counselor from 10/19/2023 in [facility] ED to Hosp-Admission (Discharged) from 06/23/2023 in [facility]  SURGICAL  C-SSRS RISK CATEGORY Moderate Risk Moderate Risk No Risk       Collaboration of Care: Collaboration of Care:   Patient/Guardian was advised Release of Information must be obtained prior to any record release in order to collaborate their care with an outside provider. Patient/Guardian was advised if they have not  already done so to contact the registration department to sign all necessary forms in order for us  to release information regarding their care.   Consent: Patient/Guardian gives verbal consent for treatment and assignment of benefits for services provided during this visit. Patient/Guardian expressed understanding and agreed to proceed.   A total of 30 minutes was spent involved in face to face clinical care, chart review, and documentation.   Augusta Blizzard, MD 11/24/2023, 8:05 AM

## 2023-11-25 ENCOUNTER — Ambulatory Visit (HOSPITAL_COMMUNITY): Payer: MEDICAID

## 2023-11-25 NOTE — Psych (Signed)
 Virtual Visit via Video Note  I connected with Shawn Brooks on 11/08/23 at  9:00 AM EDT by a video enabled telemedicine application and verified that I am speaking with the correct person using two identifiers.  Location: Patient: patient home Provider: clinical home office   I discussed the limitations of evaluation and management by telemedicine and the availability of in person appointments. The patient expressed understanding and agreed to proceed  I discussed the assessment and treatment plan with the patient. The patient was provided an opportunity to ask questions and all were answered. The patient agreed with the plan and demonstrated an understanding of the instructions.   The patient was advised to call back or seek an in-person evaluation if the symptoms worsen or if the condition fails to improve as anticipated.  Pt was provided 240 minutes of non-face-to-face time during this encounter.   Ethelle Herb, LCSW   [facility] PHP THERAPIST PROGRESS NOTE  [place] Shawn Brooks [id number]  Session Time: 9:00 - 10:00  Participation Level: Active  Behavioral Response: CasualAlertDepressed  Type of Therapy: Group Therapy  Treatment Goals addressed: Coping  Progress Towards Goals: Progressing  Interventions: CBT, DBT, Supportive, and Reframing  Summary: Shawn Brooks is a 48 y.o. male who presents with depression symptoms.  Clinician led check-in regarding current stressors and situation, and review of patient completed daily inventory. Clinician utilized active listening and empathetic response and validated patient emotions. Clinician facilitated processing group on pertinent issues.   Therapist Response: Patient arrived within time allowed. Patient rates his mood at a 5 on a scale of 1-10 with 10 being best. Pt states he feels "depressed." Pt states he slept 8 hours and ate 1x. Pt reports his mood took a turn yesterday when he got his mail. Pt states there is a letter about  reapplying for his medicaid and it led to a spiral re: how tenuous his financial situation is. Pt reports anger at his lack of stability. Pt identifies hopelessness and denies SI/HI. Pt able to process. Pt engaged in discussion.          Session Time: 10:00 am - 11:00 am   Participation Level: Active   Behavioral Response: CasualAlertDepressed   Type of Therapy: Group Therapy   Treatment Goals addressed: Coping   Progress Towards Goals: Progressing   Interventions: CBT, DBT, Solution Focused, Strength-based, Supportive, and Reframing   Summary: Cln led discussion on personal standards and they way in which it impacts the way we view ourselves and our abilities. Group members discussed judgment, struggles, and barriers they experience in terms of personal standards. Cln brought in topics of balance, grace, and kindness. Cln proposed the "best friend test" as a way to calibrate whether we are viewing our situation with kindness or harshness.    Therapist Response: Pt engaged in discussion and reports willingness to utilize the best friend test.            Session Time: 11:00 -12:00   Participation Level: Active   Behavioral Response: CasualAlertDepressed   Type of Therapy: Group Therapy   Treatment Goals addressed: Coping   Progress Towards Goals: Progressing   Interventions: CBT, DBT, Solution Focused, Strength-based, Supportive, and Reframing   Summary: Cln led discussion on the stigma of mental health and the way it has impacted pt's experience of their own mental health. Group members shared struggles such as lack of acceptance, increase in isolation, being scared of backlash when disclosing MH, and impact in support. Cln created  space for pt's to process and share. Cln highlighted support groups as a safe space.    Therapist Response: Pt engaged in discussion and shared ways stigma has impacted them.                Session Time: 12:00 -1:00   Participation  Level: Active   Behavioral Response: CasualAlertDepressed   Type of Therapy: Group therapy   Treatment Goals addressed: Coping   Progress Towards Goals: Progressing   Interventions: OT group   Summary: 12:00 - 12:50: Occupational Therapy group with cln E. Hollan.  12:50 - 1:00 Clinician assessed for immediate needs, medication compliance and efficacy, and safety concerns.   Therapist Response: 12:00 - 12:50: Pt participated 12:50 - 1:00 pm: At check-out, patient reports no immediate concerns. Patient demonstrates progress as evidenced by continued engagement and responsiveness to treatment. Patient denies SI/HI/self-harm thoughts at the end of group.      Suicidal/Homicidal: Nowithout intent/plan  Plan: Pt will continue in PHP while working to decrease depression symptoms, increase daily functioning, and increase ability to manage symptoms in a healthy manner.   Collaboration of Care: Medication Management AEB T Lewis, NP  Patient/Guardian was advised Release of Information must be obtained prior to any record release in order to collaborate their care with an outside provider. Patient/Guardian was advised if they have not already done so to contact the registration department to sign all necessary forms in order for us  to release information regarding their care.   Consent: Patient/Guardian gives verbal consent for treatment and assignment of benefits for services provided during this visit. Patient/Guardian expressed understanding and agreed to proceed.   Diagnosis: Severe episode of recurrent major depressive disorder, with psychotic features (HCC) [F33.3]    1. Severe episode of recurrent major depressive disorder, with psychotic features (HCC)        Ethelle Herb, LCSW

## 2023-11-25 NOTE — Psych (Signed)
 Virtual Visit via Video Note  I connected with Nadeem Epimenio Hasten on 11/03/23 at  9:00 AM EDT by a video enabled telemedicine application and verified that I am speaking with the correct person using two identifiers.  Location: Patient: patient home Provider: clinical home office   I discussed the limitations of evaluation and management by telemedicine and the availability of in person appointments. The patient expressed understanding and agreed to proceed  I discussed the assessment and treatment plan with the patient. The patient was provided an opportunity to ask questions and all were answered. The patient agreed with the plan and demonstrated an understanding of the instructions.   The patient was advised to call back or seek an in-person evaluation if the symptoms worsen or if the condition fails to improve as anticipated.  Pt was provided 240 minutes of non-face-to-face time during this encounter.   Ethelle Herb, LCSW   [facility] PHP THERAPIST PROGRESS NOTE  Kerolos CHRISEAN KLOTH [id number]  Session Time: 9:00 - 10:00  Participation Level: Active  Behavioral Response: CasualAlertDepressed  Type of Therapy: Group Therapy  Treatment Goals addressed: Coping  Progress Towards Goals: Progressing  Interventions: CBT, DBT, Supportive, and Reframing  Summary: Azarion RAHSHAWN REMO is a 48 y.o. male who presents with depression symptoms.  Clinician led check-in regarding current stressors and situation, and review of patient completed daily inventory. Clinician utilized active listening and empathetic response and validated patient emotions. Clinician facilitated processing group on pertinent issues.   Therapist Response: Patient arrived within time allowed. Patient rates his mood at a 5 on a scale of 1-10 with 10 being best. Pt states he feels "stressed." Pt states he slept 7 hours and ate 2x. Pt reports hie took half a melatonin last night and feels groggy this morning. Pt reports he is  grateful to be getting increased sleep but is trying to work through the sleep hangover he has been experiencing. Pt states continued high stress re: his partner's job loss and is trying to work through feelings of guilt and negative self talk re: the fact he is not working more. 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 Pt able to process. Pt engaged in discussion.          Session Time: 10:00 am - 11:00 am   Participation Level: Active   Behavioral Response: CasualAlertDepressed   Type of Therapy: Group Therapy   Treatment Goals addressed: Coping   Progress Towards Goals: Progressing   Interventions: CBT, DBT, Solution Focused, Strength-based, Supportive, and Reframing   Summary: Cln led discussion on "spiraling" or when our thoughts compound on one another to descend our feelings into worse and worse situations. Group members discussed the ways in which spiraling is difficult for them and when they are especially vulnerable. Cln offered DBT STOP and distraction skills as a way to halt the spiral once you recognize it.    Therapist Response: Pt engaged in discussion and reports spiraling is a major issue for them. Pt is able to increase awareness of spiraling throughout the discussion.          Session Time: 11:00 -12:00   Participation Level: Active   Behavioral Response: CasualAlertDepressed   Type of Therapy: Group Therapy   Treatment Goals addressed: Coping   Progress Towards Goals: Progressing   Interventions: CBT, DBT, Solution Focused, Strength-based, Supportive, and Reframing   Summary: Cln continued topic of boundaries and introduced how to set  and maintain healthy boundaries. Cln utilized handout "how to set boundaries" and group members worked through examples to practice setting appropriate boundaries.    Therapist Response: Pt engaged in discussion and reports struggle with maintaining boundaries.             Session Time: 12:00 -1:00   Participation Level: Active   Behavioral Response: CasualAlertDepressed   Type of Therapy: Group therapy   Treatment Goals addressed: Coping   Progress Towards Goals: Progressing   Interventions: OT group   Summary: 12:00 - 12:50: Occupational Therapy group with cln E. Hollan.  12:50 - 1:00 Clinician assessed for immediate needs, medication compliance and efficacy, and safety concerns.   Therapist Response: 12:00 - 12:50: Pt participated 12:50 - 1:00 pm: At check-out, patient reports no immediate concerns. Patient demonstrates progress as evidenced by continued engagement and responsiveness to treatment. Patient denies SI/HI/self-harm thoughts at the end of group.      Suicidal/Homicidal: Nowithout intent/plan  Plan: Pt will continue in PHP while working to decrease depression symptoms, increase daily functioning, and increase ability to manage symptoms in a healthy manner.   Collaboration of Care: Medication Management AEB T Lewis, NP  Patient/Guardian was advised Release of Information must be obtained prior to any record release in order to collaborate their care with an outside provider. Patient/Guardian was advised if they have not already done so to contact the registration department to sign all necessary forms in order for us  to release information regarding their care.   Consent: Patient/Guardian gives verbal consent for treatment and assignment of benefits for services provided during this visit. Patient/Guardian expressed understanding and agreed to proceed.   Diagnosis: Severe episode of recurrent major depressive disorder, with psychotic features (HCC) [F33.3]    1. Severe episode of recurrent major depressive disorder, with psychotic features (HCC)        Ethelle Herb, LCSW

## 2023-11-25 NOTE — Psych (Signed)
 Virtual Visit via Video Note  I connected with Shawn Brooks on 11/09/23 at  9:00 AM EDT by a video enabled telemedicine application and verified that I am speaking with the correct person using two identifiers.  Location: Patient: patient home Provider: clinical home office   I discussed the limitations of evaluation and management by telemedicine and the availability of in person appointments. The patient expressed understanding and agreed to proceed  I discussed the assessment and treatment plan with the patient. The patient was provided an opportunity to ask questions and all were answered. The patient agreed with the plan and demonstrated an understanding of the instructions.   The patient was advised to call back or seek an in-person evaluation if the symptoms worsen or if the condition fails to improve as anticipated.  Pt was provided 240 minutes of non-face-to-face time during this encounter.   Ethelle Herb, LCSW   [facility] PHP THERAPIST PROGRESS NOTE  Dyshaun QUENTION MCNEILL [id number]  Session Time: 9:00 - 10:00  Participation Level: Active  Behavioral Response: CasualAlertDepressed  Type of Therapy: Group Therapy  Treatment Goals addressed: Coping  Progress Towards Goals: Progressing  Interventions: CBT, DBT, Supportive, and Reframing  Summary: [place] DESHON HSIAO is a 48 y.o. male who presents with depression symptoms.  Clinician led check-in regarding current stressors and situation, and review of patient completed daily inventory. Clinician utilized active listening and empathetic response and validated patient emotions. Clinician facilitated processing group on pertinent issues.   Therapist Response: Patient arrived within time allowed. Patient rates his mood at a 6 on a scale of 1-10 with 10 being best. Pt states he feels "okay." Pt states he slept 7 hours and ate 2x. Pt reports feeling drained after group yesterday and resting in bed for most of the day. Pt identifies  feelings of hopelessness and denies SI. Pt able to process. Pt engaged in discussion.          Session Time: 10:00 am - 11:00 am   Participation Level: Active   Behavioral Response: CasualAlertDepressed   Type of Therapy: Group Therapy   Treatment Goals addressed: Coping   Progress Towards Goals: Progressing   Interventions: CBT, DBT, Solution Focused, Strength-based, Supportive, and Reframing   Summary: Cln led processing group for pt's current struggles. Group members shared stressors and provided support and feedback. Cln brought in topics of boundaries, healthy relationships, and unhealthy thought processes to inform discussion.     Therapist Response: Pt able to process and provide support to group.              Session Time: 11:00 -12:00   Participation Level: Active   Behavioral Response: CasualAlertDepressed   Type of Therapy: Group Therapy   Treatment Goals addressed: Coping   Progress Towards Goals: Progressing   Interventions: Strength-based, Supportive, and Reframing   Summary: Chaplaincy group with K. Claussen   Therapist Response: Pt participated and engaged in discussion.            Session Time: 12:00 -1:00   Participation Level: Active   Behavioral Response: CasualAlertDepressed   Type of Therapy: Group therapy   Treatment Goals addressed: Coping   Progress Towards Goals: Progressing   Interventions: OT group   Summary: 12:00 - 12:50: Occupational Therapy group with cln E. Hollan.  12:50 - 1:00 Clinician assessed for immediate needs, medication compliance and efficacy, and safety concerns.   Therapist Response: 12:00 - 12:50: Pt participated 12:50 - 1:00 pm: At check-out, patient reports no immediate  concerns. Patient demonstrates progress as evidenced by continued engagement and responsiveness to treatment. Patient denies SI/HI/self-harm thoughts at the end of group.      Suicidal/Homicidal: Nowithout intent/plan  Plan: Pt  will continue in PHP while working to decrease depression symptoms, increase daily functioning, and increase ability to manage symptoms in a healthy manner.   Collaboration of Care: Medication Management AEB T Lewis, NP  Patient/Guardian was advised Release of Information must be obtained prior to any record release in order to collaborate their care with an outside provider. Patient/Guardian was advised if they have not already done so to contact the registration department to sign all necessary forms in order for us  to release information regarding their care.   Consent: Patient/Guardian gives verbal consent for treatment and assignment of benefits for services provided during this visit. Patient/Guardian expressed understanding and agreed to proceed.   Diagnosis: Severe episode of recurrent major depressive disorder, with psychotic features (HCC) [F33.3]    1. Severe episode of recurrent major depressive disorder, with psychotic features (HCC)        Ethelle Herb, LCSW

## 2023-11-25 NOTE — Progress Notes (Signed)
 The 10-year ASCVD risk score (Arnett DK, et al., 2019) is: 7.7%   Values used to calculate the score:     Age: 48 years     Sex: Male     Is Non-Hispanic African American: Yes     Diabetic: No     Tobacco smoker: Yes     Systolic Blood Pressure: 118 mmHg     Is BP treated: No     HDL Cholesterol: 53 mg/dL     Total Cholesterol: 200 mg/dL  Currently prescribed rosuvastatin  5 mg.  Lynnel Zanetti, BSN, RN

## 2023-11-25 NOTE — Psych (Addendum)
 Virtual Visit via Video Note  I connected with Shawn Brooks on 11/04/23 at  9:00 AM EDT by a video enabled telemedicine application and verified that I am speaking with the correct person using two identifiers.  Location: Patient: patient home Provider: clinical home office   I discussed the limitations of evaluation and management by telemedicine and the availability of in person appointments. The patient expressed understanding and agreed to proceed  I discussed the assessment and treatment plan with the patient. The patient was provided an opportunity to ask questions and all were answered. The patient agreed with the plan and demonstrated an understanding of the instructions.   The patient was advised to call back or seek an in-person evaluation if the symptoms worsen or if the condition fails to improve as anticipated.  Pt was provided 120 minutes of non-face-to-face time during this encounter.   Ethelle Herb, LCSW   [facility] PHP THERAPIST PROGRESS NOTE  Shawn Brooks [id number]  Session Time: 9:00 - 10:00  Participation Level: Active  Behavioral Response: CasualAlertDepressed  Type of Therapy: Group Therapy  Treatment Goals addressed: Coping  Progress Towards Goals: Progressing  Interventions: CBT, DBT, Supportive, and Reframing  Summary: Shawn Brooks is a 48 y.o. male who presents with depression symptoms.  Clinician led check-in regarding current stressors and situation, and review of patient completed daily inventory. Clinician utilized active listening and empathetic response and validated patient emotions. Clinician facilitated processing group on pertinent issues.   Therapist Response: Patient arrived within time allowed. Patient rates his mood at a 5 on a scale of 1-10 with 10 being best. Pt states he feels "sad." Pt states he slept 8 hours and ate 2x. Pt reports he does not feel as groggy this morning, but does feel tired. Pt reports difficulty focusing  this morning due to continued anxious rumination and stress. Pt reports continued struggle with hopelessness.  Pt able to process. Pt engaged in discussion.          Session Time: 10:00 am - 11:00 am   Participation Level: Active   Behavioral Response: CasualAlertDepressed   Type of Therapy: Group Therapy   Treatment Goals addressed: Coping   Progress Towards Goals: Progressing   Interventions: CBT, DBT, Solution Focused, Strength-based, Supportive, and Reframing   Summary: Cln led discussion on negative self-talk and how it affects us . Cln utilized CBT to discuss how thoughts shape our feelings and actions. Group members shared how negative thinking affects them and worked to reframe their negative thinking.    Therapist Response:   Pt engaged in discussion and reports understanding.      **Pt chose to leave group at 11 stating he had a migraine. Pt denies SI before leaving session.       Suicidal/Homicidal: Nowithout intent/plan  Plan: Pt will continue in PHP while working to decrease depression symptoms, increase daily functioning, and increase ability to manage symptoms in a healthy manner.   Collaboration of Care: Medication Management AEB T Lewis, NP  Patient/Guardian was advised Release of Information must be obtained prior to any record release in order to collaborate their care with an outside provider. Patient/Guardian was advised if they have not already done so to contact the registration department to sign all necessary forms in order for us  to release information regarding their care.   Consent: Patient/Guardian gives verbal consent for treatment and assignment of benefits for services provided during this visit. Patient/Guardian expressed understanding and agreed to proceed.   Diagnosis:  Severe episode of recurrent major depressive disorder, with psychotic features (HCC) [F33.3]    1. Severe episode of recurrent major depressive disorder, with psychotic features  (HCC)       Ethelle Herb, LCSW

## 2023-11-25 NOTE — Psych (Signed)
 Virtual Visit via Video Note  I connected with Shawn Brooks on 11/07/23 at  9:00 AM EDT by a video enabled telemedicine application and verified that I am speaking with the correct person using two identifiers.  Location: Patient: patient home Provider: clinical home office   I discussed the limitations of evaluation and management by telemedicine and the availability of in person appointments. The patient expressed understanding and agreed to proceed  I discussed the assessment and treatment plan with the patient. The patient was provided an opportunity to ask questions and all were answered. The patient agreed with the plan and demonstrated an understanding of the instructions.   The patient was advised to call back or seek an in-person evaluation if the symptoms worsen or if the condition fails to improve as anticipated.  Pt was provided 240 minutes of non-face-to-face time during this encounter.   Ethelle Herb, LCSW   [facility] PHP THERAPIST PROGRESS NOTE  Shawn Brooks [id number]  Session Time: 9:00 - 10:00  Participation Level: Active  Behavioral Response: CasualAlertDepressed  Type of Therapy: Group Therapy  Treatment Goals addressed: Coping  Progress Towards Goals: Progressing  Interventions: CBT, DBT, Supportive, and Reframing  Summary: Shawn Brooks is a 48 y.o. male who presents with depression symptoms.  Clinician led check-in regarding current stressors and situation, and review of patient completed daily inventory. Clinician utilized active listening and empathetic response and validated patient emotions. Clinician facilitated processing group on pertinent issues.   Therapist Response: Patient arrived within time allowed. Patient rates his mood at a 4 on a scale of 1-10 with 10 being best. Pt states he feels "just here." Pt states he slept 6 hours and ate 2x. Pt reports his weekend was "mixed" with Saturday being "great" and Sunday being a "nightmare." Pt  reports an "anger blow up" on Sunday that led to a spiral. Pt reports issue when stopping to pick up food re: long wait time, rudeness, and non-responsiveness. Pt reports he was "pissed off" and cursed and screamed at the workers. Pt reports sending an email complaint which helped deflate some of the anger. Pt reports sadness took over and he struggled to sleep. Pt is able to process the situation and identify vulnerabilities, triggers, and moments for alternate behaviors. Pt reports feeling remorse for his anger outburst and frustration because he thought he had made more progress on his anger. Pt identifies hopelessness and denies SI/HI. Pt able to process. Pt engaged in discussion.          Session Time: 10:00 am - 11:00 am   Participation Level: Active   Behavioral Response: CasualAlertDepressed   Type of Therapy: Group Therapy   Treatment Goals addressed: Coping   Progress Towards Goals: Progressing   Interventions: CBT, DBT, Solution Focused, Strength-based, Supportive, and Reframing   Summary: Cln led processing group for pt's current struggles. Group members shared stressors and provided support and feedback. Cln brought in topics of boundaries, healthy relationships, and unhealthy thought processes to inform discussion.    Therapist Response:  Pt able to process and provide support to group.          Session Time: 11:00 -12:00   Participation Level: Active   Behavioral Response: CasualAlertDepressed   Type of Therapy: Group Therapy   Treatment Goals addressed: Coping   Progress Towards Goals: Progressing   Interventions: CBT, DBT, Solution Focused, Strength-based, Supportive, and Reframing   Summary: Cln led discussion on "work arounds" or finding intermediate solutions while we  are in the process of working on a final solution. Group members shared situations in which they are struggling with an issue that they are unable to fix quickly. Cln encouraged pt's to  consider ways to make things "doable" and not let "perfect" stand in the way.    Therapist Response: Pt engaged in discussion and is able to connect with topic.             Session Time: 12:00 -1:00   Participation Level: Active   Behavioral Response: CasualAlertDepressed   Type of Therapy: Group therapy   Treatment Goals addressed: Coping   Progress Towards Goals: Progressing   Interventions: OT group   Summary: 12:00 - 12:50: Occupational Therapy group with cln E. Hollan.  12:50 - 1:00 Clinician assessed for immediate needs, medication compliance and efficacy, and safety concerns.   Therapist Response: 12:00 - 12:50: Pt participated 12:50 - 1:00 pm: At check-out, patient reports no immediate concerns. Patient demonstrates progress as evidenced by continued engagement and responsiveness to treatment. Patient denies SI/HI/self-harm thoughts at the end of group.      Suicidal/Homicidal: Nowithout intent/plan  Plan: Pt will continue in PHP while working to decrease depression symptoms, increase daily functioning, and increase ability to manage symptoms in a healthy manner.   Collaboration of Care: Medication Management AEB T Lewis, NP  Patient/Guardian was advised Release of Information must be obtained prior to any record release in order to collaborate their care with an outside provider. Patient/Guardian was advised if they have not already done so to contact the registration department to sign all necessary forms in order for us  to release information regarding their care.   Consent: Patient/Guardian gives verbal consent for treatment and assignment of benefits for services provided during this visit. Patient/Guardian expressed understanding and agreed to proceed.   Diagnosis: Severe episode of recurrent major depressive disorder, with psychotic features (HCC) [F33.3]    1. Severe episode of recurrent major depressive disorder, with psychotic features (HCC)         Ethelle Herb, LCSW

## 2023-11-26 NOTE — Psych (Signed)
 Virtual Visit via Video Note  I connected with Shawn Brooks on 11/16/23 at  9:00 AM EDT by a video enabled telemedicine application and verified that I am speaking with the correct person using two identifiers.  Location: Patient: patient home Provider: clinical home office   I discussed the limitations of evaluation and management by telemedicine and the availability of in person appointments. The patient expressed understanding and agreed to proceed  I discussed the assessment and treatment plan with the patient. The patient was provided an opportunity to ask questions and all were answered. The patient agreed with the plan and demonstrated an understanding of the instructions.   The patient was advised to call back or seek an in-person evaluation if the symptoms worsen or if the condition fails to improve as anticipated.  Pt was provided 240 minutes of non-face-to-face time during this encounter.   Ethelle Herb, LCSW   [facility] PHP THERAPIST PROGRESS NOTE  Shawn Brooks [id number]  Session Time: 9:00 - 10:00  Participation Level: Active  Behavioral Response: CasualAlertDepressed  Type of Therapy: Group Therapy  Treatment Goals addressed: Coping  Progress Towards Goals: Progressing  Interventions: CBT, DBT, Supportive, and Reframing  Summary: Shawn Brooks is a 48 y.o. male who presents with depression symptoms.  Clinician led check-in regarding current stressors and situation, and review of patient completed daily inventory. Clinician utilized active listening and empathetic response and validated patient emotions. Clinician facilitated processing group on pertinent issues.   Therapist Response: Patient arrived within time allowed. Patient rates his mood at a 6 on a scale of 1-10 with 10 being best. Pt states he feels "okay." Pt states he slept 7 hours and ate 2x. Pt reports he thinks his medicine is working and he is feeling more stable. Pt states having multiple  doctor's appointments recently and they have gone well and no bad news so far. Pt able to process. Pt engaged in discussion.          Session Time: 10:00 am - 11:00 am   Participation Level: Active   Behavioral Response: CasualAlertDepressed   Type of Therapy: Group Therapy   Treatment Goals addressed: Coping   Progress Towards Goals: Progressing   Interventions: CBT, DBT, Solution Focused, Strength-based, Supportive, and Reframing   Summary: Cln led processing group for pt's current struggles. Group members shared stressors and provided support and feedback. Cln brought in topics of boundaries, healthy relationships, and unhealthy thought processes to inform discussion.     Therapist Response: Pt able to process and provide support to group.              Session Time: 11:00 -12:00   Participation Level: Active   Behavioral Response: CasualAlertDepressed   Type of Therapy: Group Therapy   Treatment Goals addressed: Coping   Progress Towards Goals: Progressing   Interventions: Strength-based, Supportive, and Reframing   Summary: Chaplaincy group with K. Claussen   Therapist Response: Pt participated and engaged in discussion.            Session Time: 12:00 -1:00   Participation Level: Active   Behavioral Response: CasualAlertDepressed   Type of Therapy: Group therapy   Treatment Goals addressed: Coping   Progress Towards Goals: Progressing   Interventions: OT group   Summary: 12:00 - 12:50: Occupational Therapy group with cln E. Hollan.  12:50 - 1:00 Clinician assessed for immediate needs, medication compliance and efficacy, and safety concerns.   Therapist Response: 12:00 - 12:50: Pt participated 12:50 -  1:00 pm: At check-out, patient reports no immediate concerns. Patient demonstrates progress as evidenced by continued engagement and responsiveness to treatment. Patient denies SI/HI/self-harm thoughts at the end of group.       Suicidal/Homicidal: Nowithout intent/plan  Plan: Pt will discharge from PHP due to meeting treatment goals od decreased depression symptoms, increased daily functioning, and increased ability to manage symptoms in a healthy manner. Pt will return to previous outpatient providers within this agency for follow-up. Pt and provider are aligned with discharge plan. Pt denies SI/HI before discharge.   Collaboration of Care: Medication Management AEB T Lewis, NP  Patient/Guardian was advised Release of Information must be obtained prior to any record release in order to collaborate their care with an outside provider. Patient/Guardian was advised if they have not already done so to contact the registration department to sign all necessary forms in order for us  to release information regarding their care.   Consent: Patient/Guardian gives verbal consent for treatment and assignment of benefits for services provided during this visit. Patient/Guardian expressed understanding and agreed to proceed.   Diagnosis: Severe episode of recurrent major depressive disorder, with psychotic features (HCC) [F33.3]    1. Severe episode of recurrent major depressive disorder, with psychotic features (HCC)        Ethelle Herb, LCSW

## 2023-11-28 ENCOUNTER — Ambulatory Visit (HOSPITAL_COMMUNITY): Payer: MEDICAID

## 2023-11-29 ENCOUNTER — Encounter (HOSPITAL_COMMUNITY): Payer: Self-pay

## 2023-12-01 ENCOUNTER — Ambulatory Visit (HOSPITAL_COMMUNITY)
Admission: RE | Admit: 2023-12-01 | Discharge: 2023-12-01 | Disposition: A | Discharge status: Home / Self Care | Payer: MEDICAID | Source: Ambulatory Visit | Attending: Cardiology | Admitting: Cardiology

## 2023-12-01 DIAGNOSIS — R072 Precordial pain: Secondary | ICD-10-CM

## 2023-12-01 DIAGNOSIS — R0602 Shortness of breath: Secondary | ICD-10-CM

## 2023-12-01 MED ORDER — IOHEXOL 350 MG/ML SOLN
100.0000 mL | Freq: Once | INTRAVENOUS | Status: AC | PRN
  Administered 2023-12-01: 100 mL via INTRAVENOUS

## 2023-12-01 MED ORDER — NITROGLYCERIN 0.4 MG SL SUBL
0.8000 mg | SUBLINGUAL_TABLET | Freq: Once | SUBLINGUAL | Status: AC
  Administered 2023-12-01: 0.8 mg via SUBLINGUAL

## 2023-12-02 ENCOUNTER — Ambulatory Visit: Payer: Self-pay | Admitting: Cardiology

## 2023-12-02 DIAGNOSIS — Z5181 Encounter for therapeutic drug level monitoring: Secondary | ICD-10-CM

## 2023-12-02 DIAGNOSIS — E785 Hyperlipidemia, unspecified: Secondary | ICD-10-CM

## 2023-12-05 MED ORDER — ROSUVASTATIN CALCIUM 10 MG PO TABS: 10.0000 mg | ORAL_TABLET | Freq: Every day | ORAL | 3 refills | Status: AC

## 2023-12-08 ENCOUNTER — Telehealth (HOSPITAL_COMMUNITY): Payer: Self-pay | Admitting: *Deleted

## 2023-12-08 DIAGNOSIS — F333 Major depressive disorder, recurrent, severe with psychotic symptoms: Secondary | ICD-10-CM

## 2023-12-08 MED ORDER — PROPRANOLOL HCL 10 MG PO TABS: 10.0000 mg | ORAL_TABLET | Freq: Two times a day (BID) | ORAL | 0 refills | Status: DC | PRN

## 2023-12-08 NOTE — Telephone Encounter (Signed)
 Pharmacy sent request for his propranolol . He should be out, last filled per pharmacy on 11/09/23. He has a future appt with Dr Leia Pun on 12/21/23. I will forward this request to the dr to renew to bridge him to his next appt in 14 days.

## 2023-12-08 NOTE — Addendum Note (Signed)
 Addended by: Augusta Blizzard B on: 12/08/2023 08:33 AM   Modules accepted: Orders

## 2023-12-09 ENCOUNTER — Encounter (HOSPITAL_BASED_OUTPATIENT_CLINIC_OR_DEPARTMENT_OTHER): Payer: Self-pay

## 2023-12-14 ENCOUNTER — Ambulatory Visit (INDEPENDENT_AMBULATORY_CARE_PROVIDER_SITE_OTHER): Payer: MEDICAID

## 2023-12-14 DIAGNOSIS — R0602 Shortness of breath: Secondary | ICD-10-CM | POA: Diagnosis not present

## 2023-12-14 DIAGNOSIS — R072 Precordial pain: Secondary | ICD-10-CM

## 2023-12-14 LAB — ECHOCARDIOGRAM COMPLETE
Area-P 1/2: 2.99 cm2
S' Lateral: 3.21 cm

## 2023-12-16 ENCOUNTER — Encounter: Payer: Self-pay | Admitting: Urology

## 2023-12-16 ENCOUNTER — Telehealth (HOSPITAL_BASED_OUTPATIENT_CLINIC_OR_DEPARTMENT_OTHER): Payer: Self-pay | Admitting: Cardiology

## 2023-12-16 ENCOUNTER — Ambulatory Visit: Payer: MEDICAID | Admitting: Urology

## 2023-12-16 VITALS — BP 133/88 | HR 74 | Ht 68.0 in | Wt 232.0 lb

## 2023-12-16 DIAGNOSIS — N529 Male erectile dysfunction, unspecified: Secondary | ICD-10-CM

## 2023-12-16 DIAGNOSIS — N401 Enlarged prostate with lower urinary tract symptoms: Secondary | ICD-10-CM

## 2023-12-16 LAB — URINALYSIS, ROUTINE W REFLEX MICROSCOPIC
Bilirubin, UA: NEGATIVE
Glucose, UA: NEGATIVE
Ketones, UA: NEGATIVE
Leukocytes,UA: NEGATIVE
Nitrite, UA: NEGATIVE
Protein,UA: NEGATIVE
RBC, UA: NEGATIVE
Specific Gravity, UA: 1.025 (ref 1.005–1.030)
Urobilinogen, Ur: 0.2 mg/dL (ref 0.2–1.0)
pH, UA: 5.5 (ref 5.0–7.5)

## 2023-12-16 LAB — BLADDER SCAN AMB NON-IMAGING: Scan Result: 24

## 2023-12-16 MED ORDER — SOLIFENACIN SUCCINATE 5 MG PO TABS: 5.0000 mg | ORAL_TABLET | Freq: Every day | ORAL | 11 refills | Status: AC

## 2023-12-16 NOTE — Telephone Encounter (Signed)
 Patient stated he is following-up on lab test ordered and wants to know if he still needs to do the lab work.

## 2023-12-16 NOTE — Progress Notes (Signed)
 BH MD Outpatient Progress Note  12/21/2023 5:29 PM Shawn Brooks  MRN:  [id number]  Assessment:  Shawn Brooks presents for follow-up evaluation in-person. Today, patient reports continued stability in mood. He does endorse continuing to struggle with memory problems and did not recall that we had met 1 month ago following PHP. Unclear etiology to his memory problems but may be due to chronic cannabis use, prior chronic alcohol abuse, or pseudodementia. Given other symptoms of MDD have been better controlled, it would seem more likely that it is secondary to his ongoing cannabis use. He expressed concern whether it could have been due to spinal injury from fall but I discussed deferring to neurologist for whether this could be the etiology to his memory problems. He currently has had significant improvement in REM sleep behavior disorder with increase in melatonin. Plan to continue all psychotropics as prescribed. Patient will be following up with Dr. Kapoor in ~1-2 months due to residency transition.    Risk Assessment: A suicide and violence risk assessment was performed as part of this evaluation. There patient is deemed to be at chronic elevated risk for self-harm/suicide given the following factors: suicidal ideation or threats without a plan, feelings of hopelessness, lack of social support, and current substance abuse. These risk factors are mitigated by the following factors: no known access to weapons or firearms, no history of violence, motivation for treatment, utilization of positive coping skills, expresses purpose for living, current treatment compliance, effective problem solving skills, and safe housing. The patient is deemed to be at chronic elevated risk for violence given the following factors: homicidal ideation and current substance abuse. These risk factors are mitigated by the following factors: no known history of violence towards others, no known history of threats of harm  towards others, and low impulsivity. There is no acute risk for suicide or violence at this time. The patient was educated about relevant modifiable risk factors including following recommendations for treatment of psychiatric illness and abstaining from substance abuse.  While future psychiatric events cannot be accurately predicted, the patient does not currently require  acute inpatient psychiatric care and does not currently meet [place]  involuntary commitment criteria.    Identifying Information: Shawn Brooks is a 48 y.o. male with historical diagnosis of MDD, social anxiety disorder, HIV on HAART threapy, HLD, and Vitamin D  deficiency who is an established patient with [facility] for medication management. He reports progressively worsening memory problems and chronically struggles with insomnia. Memory problems currently hypothesized secondary to both insufficient treatment of MDD and possibly cannabis use although uncertain if more is contributing as his memory problems do not correlate with worsening depression. Sleep study did not show signs of OSA. Diagnosis of MDD made based on patient's depressed mood, anhedonia, poor sleep, poor appetite, inattention. MOCA performed 06/02/23 was 27/30.  Plan:  # Major depressive disorder-recurrent episode, severe with psychotic features Past medication trials: Zoloft  Status of problem: stable Interventions: -- Continue Prozac  80 mg daily   #Social Anxiety Disorder Unclear etiology. 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 -Continue propranolol  10 mg twice daily   # Hypersomnia Past medication trials:  Status of problem: stable Interventions: -- Continue trazodone  50-100 mg nightly -- sleep study   REM Sleep Behavior Disorder -stopped -increase melatonin to 10 mg at bedtime -referral sleep to sleep medicine  Cannabis Use Disorder Substance induced mood disorder -advise decrease use   # EtOH use  disorder, in early remission Past medication  trials: none Status of problem: improving Interventions: -- 3-4 beers per month -- Psychoeducation provided regarding risks of dependency, tolerance, and withdrawal as well as safe limits of use; patient expresses intent to continue to reduce use   # Memory problems -Unclear etiology but differential includes chronic cannabis use, pseudodementia 2/2 MDD, wernicke's encepahlopathy versus HIV dementia versus iatrogenic   Folate deficiency Vitamin D  deficiency -Vitamin D  supplement -Multivitamin with folate  Patient was given contact information for behavioral health clinic and was instructed to call 911 for emergencies.   Subjective:  Chief Complaint:  Medication management  Interval History:  Patient reports overall doing well.  He denies significant changes in mood/anxiety since last time.  He continues to take medications as prescribed.  He reports recently moved to a new location with his partner which has been somewhat stressful due to not having AC yet. He is eating and sleeping well.  He is no longer punching/kicking and dreaming that reenactment after increased dose of melatonin.  He continues to cut back on his trazodone  use.  He denies any acute somatic complaints from medication regiment. Patient will be seeing neurology in approximately 2 weeks and will discuss his neurological symptoms with neurologist at that time.  He denies SI/HI/AVH.  His primary complaint at this point is his memory problems which he is uncertain what the etiology is.  We discussed possibility that it may be due to his chronic cannabis use as he continues to smoke approximately 1 to 2 g of cannabis a day.  Visit Diagnosis:    ICD-10-CM   1. Recurrent major depressive disorder, in partial remission (HCC)  F33.41 lamoTRIgine  (LAMICTAL ) 25 MG tablet    FLUoxetine  (PROZAC ) 40 MG capsule    propranolol  (INDERAL ) 10 MG tablet    traZODone  (DESYREL ) 100 MG tablet     2. 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 Melatonin 10 MG CAPS            Past Psychiatric History:  Diagnoses: Historical diagnosis of bipolar 1 disorder Medication trials: Zoloft  Substance use:             -- Etoh: Used to have 6 beers daily; denies history of withdrawal; now on 1 beer every ~2 weeks             -- Cannabis: Daily multiple blunts             -- Denies use of illicit drugs  Past Medical History:  Past Medical History:  Diagnosis Date   Anal condyloma    Anxiety    Blood dyscrasia    HIV   Chronic back pain    Constipation    takes miralax  daily   Depression    on meds   DJD (degenerative joint disease)    Gastric ulcer    GERD (gastroesophageal reflux disease)    Headache(784.0)    Hiatal hernia    HIV infection (HCC)    on meds   Hyperplastic colon polyp    Hypertension    on meds   Internal hemorrhoids    Sickle cell anemia (HCC)     Past Surgical History:  Procedure Laterality Date   COLONOSCOPY  2019   JMP-MAC-prep good-polyps+   ESOPHAGOGASTRODUODENOSCOPY  03/27/2012   Procedure: ESOPHAGOGASTRODUODENOSCOPY (EGD);  Surgeon: Jerrell KYM Sol, MD;  Location: [facility];  Service: Endoscopy;  Laterality: N/A;   INCISION AND DRAINAGE PERIRECTAL ABSCESS N/A 06/25/2023   Procedure: IRRIGATION AND DEBRIDEMENT PERIRECTAL ABSCESS;  Surgeon: Paola Bolk  N, MD;  Location: [facility];  Service: General;  Laterality: N/A;   IRRIGATION AND DEBRIDEMENT ABSCESS N/A 03/13/2015   Procedure: IRRIGATION AND DEBRIDEMENT ABSCESS;  Surgeon: Donnice Lunger, MD;  Location: [facility];  Service: General;  Laterality: N/A;   WISDOM TOOTH EXTRACTION      Family History:  Family History  Problem Relation Age of Onset   Drug abuse Mother    Alcohol abuse Mother    Esophageal cancer Mother 53       smoker/used ETOH   Alcohol abuse Father    Drug abuse Father    Pneumonia Father    Diabetes Father    Hypertension Sister    Crohn's disease Brother    Colon  cancer Maternal Grandfather    Crohn's disease Maternal Grandmother    Cancer Maternal Grandmother        patient unsure of type   Colon polyps Neg Hx    Rectal cancer Neg Hx    Stomach cancer Neg Hx     Social History:  Social History   Socioeconomic History   Marital status: Divorced    Spouse name: Not on file   Number of children: 1   Years of education: Not on file   Highest education level: Some college, no degree  Occupational History   Occupation: Disabled  Tobacco Use   Smoking status: Every Day    Current packs/day: 0.50    Average packs/day: 0.5 packs/day for 34.5 years (17.2 ttl pk-yrs)    Types: Cigarettes    Start date: 1991   Smokeless tobacco: Never   Tobacco comments:    not ready to quit.  Vaping Use   Vaping status: Never Used  Substance and Sexual Activity   Alcohol use: Yes    Comment: socially   Drug use: Yes    Frequency: 7.0 times per week    Types: Marijuana   Sexual activity: Not Currently    Partners: Male    Comment: declined condoms  Other Topics Concern   Not on file  Social History Narrative   Not on file   Social Drivers of Health   Financial Resource Strain: High Risk (08/30/2023)   Overall Financial Resource Strain (CARDIA)    Difficulty of Paying Living Expenses: Very hard  Food Insecurity: Food Insecurity Present (08/30/2023)   Hunger Vital Sign    Worried About Running Out of Food in the Last Year: Sometimes true    Ran Out of Food in the Last Year: Sometimes true  Transportation Needs: No Transportation Needs (09/07/2023)   PRAPARE - Administrator, Civil Service (Medical): No    Lack of Transportation (Non-Medical): No  Physical Activity: Inactive (08/30/2023)   Exercise Vital Sign    Days of Exercise per Week: 0 days    Minutes of Exercise per Session: 0 min  Stress: Stress Concern Present (08/30/2023)   [employer] of Occupational Health - Occupational Stress Questionnaire    Feeling of Stress : Very much   Social Connections: Socially Isolated (08/30/2023)   Social Connection and Isolation Panel    Frequency of Communication with Friends and Family: More than three times a week    Frequency of Social Gatherings with Friends and Family: Never    Attends Religious Services: Never    Database administrator or Organizations: No    Attends Banker Meetings: Never    Marital Status: Divorced    Allergies:  Allergies  Allergen Reactions  Chantix  [Varenicline ] Other (See Comments)    Bad dreams   Oxycodone  Itching    Current Medications: Current Outpatient Medications  Medication Sig Dispense Refill   Melatonin 10 MG CAPS Take 10 mg by mouth at bedtime. 90 capsule 0   albuterol  (VENTOLIN  HFA) 108 (90 Base) MCG/ACT inhaler Inhale 2 puffs into the lungs every 6 (six) hours as needed for wheezing or shortness of breath. 18 g 1   AMBULATORY NON FORMULARY MEDICATION Medication Name: Diltiazem  2% gel:Lidocaine  5% - using your index finger, you should apply a small amount of medication inside the rectum up to your first knuckle/joint three times daily x 6-8 weeks. 45 g 1   bictegravir-emtricitabine -tenofovir  AF (BIKTARVY) 50-200-25 MG TABS tablet Take 1 tablet by mouth daily. 30 tablet 11   Eluxadoline  (VIBERZI ) 100 MG TABS TAKE 1 TABLET BY MOUTH 1-2 TIMES EVERY DAY 180 tablet 0   FLUoxetine  (PROZAC ) 40 MG capsule Take 2 capsules (80 mg total) by mouth daily. 180 capsule 0   lamoTRIgine  (LAMICTAL ) 25 MG tablet Take 1 tablet (25 mg total) by mouth daily. 30 tablet 0   nicotine  (NICODERM CQ  - DOSED IN MG/24 HOURS) 21 mg/24hr patch Place 1 patch (21 mg total) onto the skin daily. 28 patch 1   propranolol  (INDERAL ) 10 MG tablet Take 1 tablet (10 mg total) by mouth 2 (two) times daily as needed (anxiety). 60 tablet 0   rosuvastatin  (CRESTOR ) 10 MG tablet Take 1 tablet (10 mg total) by mouth daily. 90 tablet 3   solifenacin  (VESICARE ) 5 MG tablet Take 1 tablet (5 mg total) by mouth daily. 30  tablet 11   tadalafil  (CIALIS ) 5 MG tablet Take 1 tablet (5 mg total) by mouth daily. 30 tablet 11   traZODone  (DESYREL ) 100 MG tablet Take 0.5 tablets (50 mg total) by mouth at bedtime as needed and may repeat dose one time if needed for sleep. 30 tablet 0   valACYclovir  (VALTREX ) 500 MG tablet TAKE 1 TABLET BY MOUTH TWICE DAILY 14 tablet 5   Vitamin D , Ergocalciferol , (DRISDOL ) 1.25 MG (50000 UNIT) CAPS capsule TAKE 1 CAPSULE BY MOUTH EVERY 7 DAYS 12 capsule 1   No current facility-administered medications for this visit.    ROS: Review of Systems  Objective:  Psychiatric Specialty Exam: There were no vitals taken for this visit.There is no height or weight on file to calculate BMI.  General Appearance: Casual  Eye Contact:  Good  Speech:  Clear and Coherent and Normal Rate  Volume:  Normal  Mood:  Depressed  Affect:  Depressed and Tearful  Thought Process:  Coherent, Goal Directed, and Linear  Orientation:  Full (Time, Place, and Person)  Thought Content: Logical   Suicidal Thoughts:  No  Homicidal Thoughts:  No  Memory:  Remote;   Good  Judgment:  Fair  Insight:  Fair  Psychomotor Activity:  Normal  Concentration:  Concentration: Good and Attention Span: Good              Assets:  Communication Skills Desire for Improvement Financial Resources/Insurance Housing Intimacy Leisure Time Physical Health Resilience Social Support Talents/Skills Transportation Vocational/Educational  ADL's:  Intact  Cognition: WNL  Sleep:  Good   PE: General: well-appearing; no acute distress  Pulm: no increased work of breathing on room air  Strength & Muscle Tone: within normal limits Neuro: no focal neurological deficits observed  Gait & Station: normal  Metabolic Disorder Labs: Lab Results  Component Value Date   HGBA1C  5.7 04/08/2023   MPG 117 (H) 11/16/2013   No results found for: PROLACTIN Lab Results  Component Value Date   CHOL 200 (H) 04/08/2023   TRIG  130 04/08/2023   HDL 53 04/08/2023   CHOLHDL 3.8 04/08/2023   VLDL 28 05/12/2016   LDLCALC 124 (H) 04/08/2023   LDLCALC 173 (H) 10/07/2022   Lab Results  Component Value Date   TSH 2.370 03/18/2023   TSH 1.150 09/01/2017    Therapeutic Level Labs: No results found for: LITHIUM No results found for: VALPROATE No results found for: CBMZ  Screenings: GAD-7    Flowsheet Row Office Visit from 10/12/2023 in [facility] - A Dept Of [place]. [facility] Office Visit from 08/30/2023 in [facility] - A Dept Of [place]. [facility] Office Visit from 04/08/2023 in [facility] [place] - A Dept Of [place]. [facility] Office Visit from 10/07/2022 in [facility] [place] - A Dept Of [place]. [facility] Video Visit from 03/18/2022 in [facility]  Total GAD-7 Score 19 7 15  0 16   PHQ2-9    Flowsheet Row Counselor from 10/26/2023 in [facility] Counselor from 10/19/2023 in [facility] Office Visit from 10/12/2023 in [facility] - A Dept Of [place]. [facility] Office Visit from 08/30/2023 in [facility] - A Dept Of [place]. [facility] Office Visit from 04/08/2023 in [facility] [place] - A Dept Of [place]. [facility]  PHQ-2 Total Score 6 6 6 2 5   PHQ-9 Total Score 21 23 23 9 13    Flowsheet Row Counselor from 10/26/2023 in [facility] Counselor from 10/19/2023 in [facility] ED to Hosp-Admission (Discharged) from 06/23/2023 in [facility]  SURGICAL  C-SSRS RISK CATEGORY Moderate Risk Moderate Risk No Risk    Collaboration of Care: Collaboration of Care:   Patient/Guardian was advised Release of Information must be obtained prior to any record  release in order to collaborate their care with an outside provider. Patient/Guardian was advised if they have not already done so to contact the registration department to sign all necessary forms in order for us  to release information regarding their care.   Consent: Patient/Guardian gives verbal consent for treatment and assignment of benefits for services provided during this visit. Patient/Guardian expressed understanding and agreed to proceed.   A total of 30 minutes was spent involved in face to face clinical care, chart review, and documentation.   Prentice Espy, MD 12/21/2023, 5:29 PM

## 2023-12-16 NOTE — Progress Notes (Signed)
 Assessment: 1. BPH with lower urinary tract symptoms without urinary obstruction   2. Organic impotence   3. Ejaculatory disorder     Plan: Trial of solifenacin 5 mg nightly for his symptoms.  Prescription sent.  Use and side effects discussed. Recommend using the tadalafil  on an as-needed basis with a dose of 10 or 20 mg. Return to office in 2 months  Chief Complaint:  Chief Complaint  Patient presents with   Benign Prostatic Hypertrophy    History of Present Illness:  Shawn Brooks is a 48 y.o. male who is seen for continued evaluation of lower urinary tract symptoms and erectile dysfunction.  He has a history of erectile dysfunction with difficulty achieving and maintaining his erections.  He has previously used Cialis  with some benefit.  He reports that he has not had sex in 2 years following an episode of symptoms associated with his orgasm.  Since that time, he has been masturbating.  He reports a decrease in the amount of ejaculation.  No pain with erection or ejaculation.  No curvature with erections.  He does report occasional a.m. erections.  No decrease in his libido.  He reported lower urinary tract symptoms including frequency, voiding every 60-90 minutes, urgency, nocturia x 3, and straining to void.  No dysuria or gross hematuria. IPSS = 25/5. PVR = 0 mL. PSA 4/25:  1.5 He was given a trial of tadalafil  5 mg daily for management of his ED and BPH with LUTS.  He returns today for follow-up.  He reports that he initially noted some improvement in his urinary symptoms with the daily tadalafil .  He does not feel like the improvement continued.  He continues with frequency, voiding every 1-2 hours during the day, nocturia x 2, and urgency.  No dysuria or gross hematuria. IPSS = 25/4.  Portions of the above documentation were copied from a prior visit for review purposes only.   Past Medical History:  Past Medical History:  Diagnosis Date   Anal condyloma     Anxiety    Blood dyscrasia    HIV   Chronic back pain    Constipation    takes miralax  daily   Depression    on meds   DJD (degenerative joint disease)    Gastric ulcer    GERD (gastroesophageal reflux disease)    Headache(784.0)    Hiatal hernia    HIV infection (HCC)    on meds   Hyperplastic colon polyp    Hypertension    on meds   Internal hemorrhoids    Sickle cell anemia (HCC)     Past Surgical History:  Past Surgical History:  Procedure Laterality Date   COLONOSCOPY  2019   JMP-MAC-prep good-polyps+   ESOPHAGOGASTRODUODENOSCOPY  03/27/2012   Procedure: ESOPHAGOGASTRODUODENOSCOPY (EGD);  Surgeon: Yvetta Herbert, MD;  Location: [facility];  Service: Endoscopy;  Laterality: N/A;   INCISION AND DRAINAGE PERIRECTAL ABSCESS N/A 06/25/2023   Procedure: IRRIGATION AND DEBRIDEMENT PERIRECTAL ABSCESS;  Surgeon: Anda Bamberg, MD;  Location: [facility];  Service: General;  Laterality: N/A;   IRRIGATION AND DEBRIDEMENT ABSCESS N/A 03/13/2015   Procedure: IRRIGATION AND DEBRIDEMENT ABSCESS;  Surgeon: Jacolyn Matar, MD;  Location: [facility];  Service: General;  Laterality: N/A;   WISDOM TOOTH EXTRACTION      Allergies:  Allergies  Allergen Reactions   Chantix  [Varenicline ] Other (See Comments)    Bad dreams   Oxycodone  Itching    Family History:  Family History  Problem Relation Age of Onset   Drug abuse Mother    Alcohol abuse Mother    Esophageal cancer Mother 66       smoker/used ETOH   Alcohol abuse Father    Drug abuse Father    Pneumonia Father    Diabetes Father    Hypertension Sister    Crohn's disease Brother    Colon cancer Maternal Grandfather    Crohn's disease Maternal Grandmother    Cancer Maternal Grandmother        patient unsure of type   Colon polyps Neg Hx    Rectal cancer Neg Hx    Stomach cancer Neg Hx     Social History:  Social History   Tobacco Use   Smoking status: Every Day    Current packs/day: 0.50    Average packs/day:  0.5 packs/day for 34.5 years (17.2 ttl pk-yrs)    Types: Cigarettes    Start date: 12   Smokeless tobacco: Never   Tobacco comments:    not ready to quit.  Vaping Use   Vaping status: Never Used  Substance Use Topics   Alcohol use: Yes    Comment: socially   Drug use: Yes    Frequency: 7.0 times per week    Types: Marijuana    ROS: Constitutional:  Negative for fever, chills, weight loss CV: Negative for chest pain, previous MI, hypertension Respiratory:  Negative for shortness of breath, wheezing, sleep apnea, frequent cough GI:  Negative for nausea, vomiting, bloody stool, GERD  Physical exam: BP 133/88   Pulse 74   Ht 5' 8 (1.727 m)   Wt 232 lb (105.2 kg)   BMI 35.28 kg/m  GENERAL APPEARANCE:  Well appearing, well developed, well nourished, NAD HEENT:  Atraumatic, normocephalic, oropharynx clear NECK:  Supple without lymphadenopathy or thyromegaly ABDOMEN:  Soft, non-tender, no masses EXTREMITIES:  Moves all extremities well, without clubbing, cyanosis, or edema NEUROLOGIC:  Alert and oriented x 3, normal gait, CN II-XII grossly intact MENTAL STATUS:  appropriate BACK:  Non-tender to palpation, No CVAT SKIN:  Warm, dry, and intact  Results: U/A: Negative  PVR = 24 ml

## 2023-12-21 ENCOUNTER — Ambulatory Visit (INDEPENDENT_AMBULATORY_CARE_PROVIDER_SITE_OTHER): Payer: MEDICAID | Admitting: Student

## 2023-12-21 DIAGNOSIS — F3341 Major depressive disorder, recurrent, in partial remission: Secondary | ICD-10-CM | POA: Diagnosis not present

## 2023-12-21 DIAGNOSIS — G4752 REM sleep behavior disorder: Secondary | ICD-10-CM

## 2023-12-21 MED ORDER — PROPRANOLOL HCL 10 MG PO TABS: 10.0000 mg | ORAL_TABLET | Freq: Two times a day (BID) | ORAL | 0 refills | Status: DC | PRN

## 2023-12-21 MED ORDER — LAMOTRIGINE 25 MG PO TABS
25.0000 mg | ORAL_TABLET | Freq: Every day | ORAL | 0 refills | Status: DC
Start: 2023-12-21 — End: 2024-01-26

## 2023-12-21 MED ORDER — TRAZODONE HCL 100 MG PO TABS: 50.0000 mg | ORAL_TABLET | Freq: Every evening | ORAL | 0 refills | Status: DC | PRN

## 2023-12-21 MED ORDER — FLUOXETINE HCL 40 MG PO CAPS: 80.0000 mg | ORAL_CAPSULE | Freq: Every day | ORAL | 0 refills | Status: DC

## 2023-12-21 MED ORDER — MELATONIN 10 MG PO CAPS: 10.0000 mg | ORAL_CAPSULE | Freq: Every day | ORAL | 0 refills | Status: DC

## 2023-12-23 NOTE — Addendum Note (Signed)
 Addended by: CARVIN CROCK on: 12/23/2023 09:51 AM   Modules accepted: Level of Service

## 2023-12-26 ENCOUNTER — Other Ambulatory Visit: Payer: Self-pay

## 2023-12-26 ENCOUNTER — Telehealth: Payer: Self-pay

## 2023-12-26 ENCOUNTER — Other Ambulatory Visit: Payer: MEDICAID

## 2023-12-26 DIAGNOSIS — B20 Human immunodeficiency virus [HIV] disease: Secondary | ICD-10-CM

## 2023-12-26 DIAGNOSIS — Z113 Encounter for screening for infections with a predominantly sexual mode of transmission: Secondary | ICD-10-CM

## 2023-12-26 NOTE — Telephone Encounter (Signed)
 Shawn Brooks thanks

## 2023-12-26 NOTE — Telephone Encounter (Signed)
 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 Per Cathlyn, NP patient will need 2.4 MU Bicillin  x 1.  I spoke to the patient and he is scheduled for treatment and labs.  Aybree Lanyon ONEIDA Ligas, CMA

## 2023-12-28 ENCOUNTER — Encounter: Payer: Self-pay | Admitting: Internal Medicine

## 2023-12-28 ENCOUNTER — Other Ambulatory Visit: Payer: MEDICAID

## 2023-12-28 ENCOUNTER — Ambulatory Visit: Payer: MEDICAID | Admitting: Internal Medicine

## 2023-12-28 ENCOUNTER — Ambulatory Visit: Payer: MEDICAID

## 2023-12-28 ENCOUNTER — Other Ambulatory Visit (HOSPITAL_COMMUNITY)
Admission: RE | Admit: 2023-12-28 | Discharge: 2023-12-28 | Disposition: A | Discharge status: Home / Self Care | Payer: MEDICAID | Source: Ambulatory Visit | Attending: Internal Medicine | Admitting: Internal Medicine

## 2023-12-28 ENCOUNTER — Other Ambulatory Visit: Payer: Self-pay

## 2023-12-28 VITALS — BP 129/84 | HR 60 | Temp 97.5°F | Ht 68.0 in | Wt 229.0 lb

## 2023-12-28 DIAGNOSIS — Z202 Contact with and (suspected) exposure to infections with a predominantly sexual mode of transmission: Secondary | ICD-10-CM

## 2023-12-28 DIAGNOSIS — B20 Human immunodeficiency virus [HIV] disease: Secondary | ICD-10-CM | POA: Diagnosis not present

## 2023-12-28 DIAGNOSIS — M791 Myalgia, unspecified site: Secondary | ICD-10-CM

## 2023-12-28 DIAGNOSIS — Z23 Encounter for immunization: Secondary | ICD-10-CM | POA: Diagnosis not present

## 2023-12-28 DIAGNOSIS — Z113 Encounter for screening for infections with a predominantly sexual mode of transmission: Secondary | ICD-10-CM | POA: Diagnosis present

## 2023-12-28 DIAGNOSIS — Z129 Encounter for screening for malignant neoplasm, site unspecified: Secondary | ICD-10-CM

## 2023-12-28 DIAGNOSIS — Z111 Encounter for screening for respiratory tuberculosis: Secondary | ICD-10-CM

## 2023-12-28 MED ORDER — ZOSTER VAC RECOMB ADJUVANTED 50 MCG/0.5ML IM SUSR: 0.5000 mL | Freq: Once | INTRAMUSCULAR | 0 refills | Status: AC

## 2023-12-28 MED ORDER — PENICILLIN G BENZATHINE 1200000 UNIT/2ML IM SUSY
1.2000 10*6.[IU] | PREFILLED_SYRINGE | Freq: Once | INTRAMUSCULAR | Status: AC
  Administered 2023-12-28: 1.2 10*6.[IU] via INTRAMUSCULAR

## 2023-12-28 MED ORDER — METHOCARBAMOL 500 MG PO TABS: 500.0000 mg | ORAL_TABLET | Freq: Three times a day (TID) | ORAL | 0 refills | Status: AC | PRN

## 2023-12-28 NOTE — Progress Notes (Signed)
 Subjective:    Patient ID: Shawn Brooks, male    DOB: 10-16-1975, 48 y.o.   MRN: [id number]  HPI Mandrell is here for follow up of HIV He continues on Genvoya  and Prezista  with no missed doses.  No issus with getting or taking his medication.  He continues in care withmental health.  No complaints today.   10/12/23 id clinic visit; first with dr Overton See a&p Reviewed chart   12/28/23 id clinic f/u Recent syphilis dx and treated by DIS with 2.4 mil units bicillin  once. His partner was tested positive so just advised to get tx. No test yet  See a&p     Review of Systems  Constitutional:  Negative for fatigue.  Gastrointestinal:  Negative for diarrhea and nausea.  Skin:  Negative for rash.       Objective:    Vitals:   12/28/23 1057  BP: 129/84  Pulse: 60  Temp: (!) 97.5 F (36.4 C)  SpO2: 98%   Body mass index is 34.82 kg/m. General/constitutional: no distress, pleasant HEENT: Normocephalic, PER, Conj Clear, EOMI, Oropharynx clear Neck supple CV: rrr no mrg Lungs: clear to auscultation, normal respiratory effort Abd: Soft, Nontender Ext: no edema Skin: No Rash Neuro: nonfocal MSK: no peripheral joint swelling/tenderness/warmth; back spines nontender  Labs: Lab Results  Component Value Date   WBC 7.7 10/12/2023   HGB 15.2 10/12/2023   HCT 45.2 10/12/2023   MCV 89.3 10/12/2023   PLT 224 10/12/2023   Last metabolic panel Lab Results  Component Value Date   GLUCOSE 87 10/12/2023   NA 139 10/12/2023   K 4.2 10/12/2023   CL 102 10/12/2023   CO2 28 10/12/2023   BUN 9 10/12/2023   CREATININE 1.22 10/12/2023   GFRNONAA >60 06/27/2023   CALCIUM  9.8 10/12/2023   PHOS 3.0 05/02/2017   PROT 7.5 10/12/2023   ALBUMIN 3.9 06/23/2023   LABGLOB 3.0 03/18/2023   AGRATIO 1.9 06/11/2021   BILITOT 0.3 10/12/2023   ALKPHOS 106 06/23/2023   AST 19 10/12/2023   ALT 21 10/12/2023   ANIONGAP 8 06/27/2023   HIV: 09/2023                  ud     02/2023                   ud       /      1073 (32%)    HIV Genotype: 04/2010  HIV 1 Genotyping, DNA Seq                              See Comment      DRUG RESISTANCE MUTATION ANALYSIS      RESISTANCE INTERPRETATION      -----------------------------------  ------------------------------      abacavir (ABC)                         No Evidence of Resistance      didanosine (ddl)                       No Evidence of Resistance      lamivudine(3TC)/emtricitabine (FTC)     No Evidence of Resistance      stavudine (d4T)  No Evidence of Resistance      tenofovir  (TDF)                        No Evidence of Resistance      zidovudine (AZT)                       No Evidence of Resistance      efavirenz (EFV)                        No Evidence of Resistance      etravirine (ETR)                       No Evidence of Resistance      nevirapine (NVP)                       No Evidence of Resistance      atazanavir (ATV)                       No Evidence of Resistance        ATV/r**                              No Evidence of Resistance      darunavir  + ritonavir (DRV/r)           No Evidence of Resistance      fosamprenavir (FPV)                    No Evidence of Resistance        FPV/r**                              No Evidence of Resistance      indinavir (IDV)                        No Evidence of Resistance        IDV/r**                              No Evidence of Resistance      lopinavir + ritonavir  (LPV/r)          No Evidence of Resistance      nelfinavir (NFV)                       No Evidence of Resistance      saquinavir + ritonavir  (SQV/r)         No Evidence of Resistance      tipranavir + ritonavir  (TPV/r)         No Evidence of Resistance            **Protease Inhibitors administered with low-dose ritonavir  for      pharmacological boosting.  ! Resistance Associated RT Mutations:                              See Comment      No Relevant Mutations Detected  ! 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 There as discussion previously with dr Efrain who was considering this given no prior resistance known and patient had started treatment after dx and has been well controlled  Patient has medicaid  Recommended starting biktarvy   12/28/23 3 month f/u since transitioning to biktarvy from genvoya /prezista    -discussed u=u -encourage compliance -continue current HIV medication until he picks up biktarvy then start that once a day -labs today -f/u 6 months    #hx syphilis Titer nonreactive --> 1:1 and treated in 2020 Titer has been nonreactive since 2020; last titer 02/2023 nonreactive  12/28/23 was exposed and notified by [facility] 12/26/23.   -repeat titer today -bicillin  2.4 mil units given I'm   #Other medical problems: -recurrent hsv -hlp -htn -copd -anxiety/depression    #concern about memory No sign of NPH Occasional etoh use only Hx syphilis no prior neurosyphilis dx No numbness/tingling/weakness No dx of sleep apnea (sleep study done early 2025) No hx TBI No family hx early onset dementia  Does have hx depression -- on mirtazepine; doesn't see psychiatry  Describe both short term and long term memory. Doesn't know to get around in [place] like he used to or remember what to do with his jobs  Duration 2-3 years as of 09/2023   -trying to get on disability -haven't spoken to pcp about this as he can't seem to remember -will forward chart to PCP to follow this up -I do not suspect related to his well treated HIV -neurology referral    #muscle ache Lower back bilateral shoulders more  achy the last few weeks. Chronic lower back pain No fever, chill  -prn tylenol  -robaxin  10 days trial    #hcm -vaccination Tdap 2019 Prevnar20 @ 2023 Meningococcal booster 2020 Shingrix  first shot 08/2023; 2nd shot today 12/28/2023 -hepatitis 10/12/23 hep b sAb <5; cAb and sAg nonreactive 09/2023 hep c Ab nonreactive -std screening 09/2023 rpr nonreactive; urine gc/chlam negative 12/28/23 rpr and triple screen today -tb screening 12/28/23 quantiferon gold ordered -cancer screening Defer to pcp for age appropriate cancer screening Colonoscopy 06/2023 normal per patient's report Anal pap 12/28/23    #social -born/raised [place]  -12/2023 just got in a new house -in a steady relationship for 20 years used to be open relationship: 12/28/23 still same relationship. Kind of open.  -travel -- [place], [place], [place], [place] , [place]; nothing outside of [place]  -marijuanna -tobacco -- 35 years 1 pack every 2 days -no hx ivdu or other street drug use -not currently working 10/12/23 due to memory

## 2023-12-28 NOTE — Addendum Note (Signed)
 Addended by: CELESTIA LELA HERO on: 12/28/2023 12:17 PM   Modules accepted: Orders

## 2023-12-28 NOTE — Patient Instructions (Signed)
 Vaccine: You are due for your 2nd shot of shingle -- prescription given (please fill or ask if pharmacy can give you this shot; otherwise bring to our clinic and we can administer) I strongly recommend hepatitis B vaccine (2 shot series) -- first shot today   Labs today  Anal pap smear for anal cancer screening today    Continue biktarvy    See me again in 6 months (will complete 2nd shot hepatitis b vaccine at that time)

## 2023-12-29 LAB — CYTOLOGY, (ORAL, ANAL, URETHRAL) ANCILLARY ONLY
Chlamydia: NEGATIVE
Chlamydia: NEGATIVE
Comment: NEGATIVE
Comment: NORMAL
Comment: NEGATIVE
Comment: NORMAL
Neisseria Gonorrhea: NEGATIVE
Neisseria Gonorrhea: NEGATIVE

## 2024-01-01 LAB — HIV-1 RNA QUANT-NO REFLEX-BLD
HIV 1 RNA Quant: NOT DETECTED {copies}/mL
HIV-1 RNA Quant, Log: NOT DETECTED {Log_copies}/mL

## 2024-01-01 LAB — QUANTIFERON-TB GOLD PLUS
Mitogen-NIL: 8.86 [IU]/mL
NIL: 0.01 [IU]/mL
QuantiFERON-TB Gold Plus: NEGATIVE
TB1-NIL: 0.03 [IU]/mL
TB2-NIL: 0.03 [IU]/mL

## 2024-01-01 LAB — RPR: RPR Ser Ql: NONREACTIVE

## 2024-01-02 ENCOUNTER — Other Ambulatory Visit: Payer: Self-pay | Admitting: Internal Medicine

## 2024-01-02 ENCOUNTER — Ambulatory Visit: Payer: MEDICAID | Attending: Internal Medicine | Admitting: Internal Medicine

## 2024-01-02 VITALS — BP 131/86 | HR 63 | Temp 98.5°F | Ht 68.0 in | Wt 226.4 lb

## 2024-01-02 DIAGNOSIS — E6609 Other obesity due to excess calories: Secondary | ICD-10-CM

## 2024-01-02 DIAGNOSIS — M25562 Pain in left knee: Secondary | ICD-10-CM

## 2024-01-02 DIAGNOSIS — F17210 Nicotine dependence, cigarettes, uncomplicated: Secondary | ICD-10-CM

## 2024-01-02 DIAGNOSIS — R030 Elevated blood-pressure reading, without diagnosis of hypertension: Secondary | ICD-10-CM | POA: Diagnosis not present

## 2024-01-02 DIAGNOSIS — I2510 Atherosclerotic heart disease of native coronary artery without angina pectoris: Secondary | ICD-10-CM | POA: Diagnosis not present

## 2024-01-02 DIAGNOSIS — M25561 Pain in right knee: Secondary | ICD-10-CM

## 2024-01-02 DIAGNOSIS — M545 Low back pain, unspecified: Secondary | ICD-10-CM

## 2024-01-02 DIAGNOSIS — G8929 Other chronic pain: Secondary | ICD-10-CM

## 2024-01-02 DIAGNOSIS — Z23 Encounter for immunization: Secondary | ICD-10-CM | POA: Diagnosis not present

## 2024-01-02 DIAGNOSIS — Z6834 Body mass index (BMI) 34.0-34.9, adult: Secondary | ICD-10-CM

## 2024-01-02 DIAGNOSIS — E782 Mixed hyperlipidemia: Secondary | ICD-10-CM | POA: Diagnosis not present

## 2024-01-02 DIAGNOSIS — Z139 Encounter for screening, unspecified: Secondary | ICD-10-CM

## 2024-01-02 DIAGNOSIS — Z6379 Other stressful life events affecting family and household: Secondary | ICD-10-CM

## 2024-01-02 DIAGNOSIS — F17200 Nicotine dependence, unspecified, uncomplicated: Secondary | ICD-10-CM

## 2024-01-02 DIAGNOSIS — E66811 Obesity, class 1: Secondary | ICD-10-CM

## 2024-01-02 DIAGNOSIS — Z789 Other specified health status: Secondary | ICD-10-CM

## 2024-01-02 LAB — URINE CYTOLOGY ANCILLARY ONLY
Chlamydia: NEGATIVE
Comment: NEGATIVE
Comment: NEGATIVE
Comment: NORMAL
Neisseria Gonorrhea: NEGATIVE
Trichomonas: NEGATIVE

## 2024-01-02 MED ORDER — ACETAMINOPHEN ER 650 MG PO TBCR: 650.0000 mg | EXTENDED_RELEASE_TABLET | Freq: Three times a day (TID) | ORAL | 1 refills | Status: AC | PRN

## 2024-01-02 MED ORDER — ASPIRIN 81 MG PO TBEC: 81.0000 mg | DELAYED_RELEASE_TABLET | Freq: Every day | ORAL | 1 refills | Status: AC

## 2024-01-02 NOTE — Patient Instructions (Addendum)
 VISIT SUMMARY:  You came in for your four-month follow-up visit to discuss your ongoing health concerns, including HIV, hypertension, coronary artery disease, knee pain, back pain, and smoking cessation. We also addressed your recent weight loss due to food scarcity and provided resources to help with this issue.  YOUR PLAN:  -KNEE PAIN: You have chronic knee pain, especially in your left knee, which worsens with activities like using stairs. We will get x-rays of your knee and refer you to an orthopedic specialist for further evaluation. In the meantime, you can take acetaminophen  650 mg every 8 hours as needed and use an over-the-counter menthol-based topical analgesic for relief.  -BACK PAIN: You have chronic lower back pain that has worsened recently, possibly due to a fall on the stairs. We will get x-rays of your lumbar spine and refer you to an orthopedic specialist and physical therapy for further evaluation and management. You can take acetaminophen  650 mg every 8 hours as needed and use an over-the-counter menthol-based topical analgesic for relief.  -CORONARY ARTERY DISEASE: You have mild, nonobstructive coronary artery disease, which means there is some plaque in your arteries but it is not blocking blood flow. To manage this, we are changing your rosuvastatin  dose to 10 mg daily and will repeat your lipid profile in two months. We are also prescribing low-dose aspirin  daily to help prevent heart attacks and strokes.  -HYPERTENSION: Your blood pressure is slightly elevated, which can increase your risk for heart disease and stroke. We will recheck your blood pressure in two weeks with a clinical pharmacist and consider starting you on antihypertensive medication if it remains elevated.  -HIV: Your HIV is well-controlled with your current medications, and your recent levels are undetectable, which is excellent news. Continue taking your medications as prescribed.  -NICOTINE  DEPENDENCE: You  have not started using the nicotine  patches to quit smoking due to stress. We encourage you to use the patches when you feel ready to quit smoking, as this will significantly benefit your overall health.  -NUTRITIONAL DEFICIENCY RISK: You are at risk for nutritional deficiencies due to food scarcity. We have provided information on local food banks and will arrange a follow-up with a case worker to assess your needs and help you access more nutritious food options.  -GENERAL HEALTH MAINTENANCE: You are due for your second shingles vaccine, which we will administer during today's visit.  INSTRUCTIONS:  Please follow up with the clinical pharmacist in two weeks to recheck your blood pressure. We will repeat your lipid profile in two months. Additionally, we will arrange a follow-up with a case worker to help with your nutritional needs.  Please go to [facility] for your x-rays. No appointment is needed. Walk-ins are welcome.  Address: [address] [place], [place] [address] Phone #: [phone]

## 2024-01-02 NOTE — Progress Notes (Signed)
 Patient ID: Shawn Brooks, male    DOB: [birth year]  MRN: [id number]  CC: Follow-up   Subjective: Shawn Brooks is a 48 y.o. male who presents for chronic ds management. His concerns today include:  HIV, DJD,  hyperlipidemia, anxiety, bipolar 1  (followed by psychiatry ), tobacco dependence, vit D deficiency, numbness in hands (neg EMG 2019), chronic bilateral lower back pain , rectal fissure, internal hemorrhoids, PreDM.   Discussed the use of AI scribe software for clinical note transcription with the patient, who gave verbal consent to proceed.  History of Present Illness Shawn Brooks is a 48 year old male with HIV, hypertension, and HL who presents for a four-month follow-up visit.  HL: He continues to take and tolerate rosuvastatin  daily for cholesterol management.  Saw cardiology since last visit with me for evaluation of dyspnea on exertion.  His echo revealed normal heart function.  Cardiac CT showed nonobstructive CAD.  Cardiologist also said it was not unreasonable to utilize low-dose baby aspirin    HIV:  followed by ID; compliant with medications, with undetectable levels as of July 2nd.   Tob dep: Prescribed nicotine  patches on last visit.  Did not get it filled but has not started taking.  Reports that he is under a lot of stress.    He has lost six pounds since March, now weighing 226 pounds.  He attributes the weight loss to food scarcity. He receives food stamps, which he gets the middle of the month; he finds food bank options inadequate or not good.  Today he complains of flareup of lower back pain after he slipped and fell on his buttock on some stairs a few months ago. It has gotten worse recently; also hurts in shoulder blade area.  Also has been having some knee pain times a few months, with the left knee more bothersome. The pain worsens when he has to walk up or down stairs.  There are no injuries or swelling.  Muscle relaxant, Robaxin , prescribed by his ID  specialist and taken for three to four days have not provided relief.  Blood pressure noted to be elevated today and on visit last week with his ID specialist.  However when he saw cardiologist in May and on last visit with me, blood pressure was good.  He prefers seasoning over salt and rarely uses salt.    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   Perianal abscess 06/24/2023   Acute urinary retention 06/24/2023   AKI (acute kidney injury) (HCC) 06/24/2023   Anal fissure 06/17/2023   Folate deficiency 04/08/2023   Obstructive sleep apnea 03/10/2023   Severe episode of recurrent major depressive disorder, with psychotic features (HCC) 02/24/2023   Social anxiety disorder 02/24/2023   Class 2 severe obesity due to excess calories with serious comorbidity and body mass index (BMI) of 35.0 to 35.9 in adult [facility]) 10/07/2022   Influenza vaccine refused 06/17/2020   Esophageal dysphagia 06/17/2020   Mixed hyperlipidemia 06/17/2020   Obesity, Class I, BMI 30-34.9 06/17/2020   History of farsightedness 06/17/2020   Chronic night sweats 04/10/2020   Counseled about COVID-19 virus infection 09/18/2019   Urinary frequency 12/26/2018   Medication monitoring encounter 08/08/2018   Memory problem 09/01/2017   Vitamin D  deficiency 09/01/2017   Primary osteoarthritis of left knee 05/02/2017   Prediabetes 12/22/2016   Tobacco abuse 05/31/2016  Lumbar transverse process fracture (HCC) 10/02/2015   Screening examination for sexually transmitted disease 04/04/2014   Thoracic or lumbosacral neuritis or radiculitis 01/15/2013   H/O gastrointestinal disease 01/15/2013   Lumbar radiculopathy 01/15/2013   Carcinoma in situ of anal canal 11/17/2012   Condyloma acuminatum 11/17/2012   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   AGW (anogenital warts) 11/17/2012    Hemorrhoid 09/27/2012   BRBPR (bright red blood per rectum) 07/27/2012   Prostatitis 04/12/2012   Constipation 03/09/2012   Callus 08/16/2011   Ingrown toenail 08/16/2011   Chronic low back pain 08/16/2011   Dyslipidemia 09/03/2010   Herpes simplex virus (HSV) infection 07/06/2010   Erectile dysfunction 07/06/2010   BURSITIS, KNEE 05/06/2010   Human immunodeficiency virus (HIV) disease (HCC) 04/16/2010     Current Outpatient Medications on File Prior to Visit  Medication Sig Dispense Refill   albuterol  (VENTOLIN  HFA) 108 (90 Base) MCG/ACT inhaler Inhale 2 puffs into the lungs every 6 (six) hours as needed for wheezing or shortness of breath. 18 g 1   AMBULATORY NON FORMULARY MEDICATION Medication Name: Diltiazem  2% gel:Lidocaine  5% - using your index finger, you should apply a small amount of medication inside the rectum up to your first knuckle/joint three times daily x 6-8 weeks. 45 g 1   bictegravir-emtricitabine -tenofovir  AF (BIKTARVY) 50-200-25 MG TABS tablet Take 1 tablet by mouth daily. 30 tablet 11   FLUoxetine  (PROZAC ) 40 MG capsule Take 2 capsules (80 mg total) by mouth daily. 180 capsule 0   lamoTRIgine  (LAMICTAL ) 25 MG tablet Take 1 tablet (25 mg total) by mouth daily. 30 tablet 0   Melatonin 10 MG CAPS Take 10 mg by mouth at bedtime. 90 capsule 0   methocarbamol  (ROBAXIN ) 500 MG tablet Take 1 tablet (500 mg total) by mouth every 8 (eight) hours as needed for up to 10 days for muscle spasms. 30 tablet 0   nicotine  (NICODERM CQ  - DOSED IN MG/24 HOURS) 21 mg/24hr patch Place 1 patch (21 mg total) onto the skin daily. 28 patch 1   propranolol  (INDERAL ) 10 MG tablet Take 1 tablet (10 mg total) by mouth 2 (two) times daily as needed (anxiety). 60 tablet 0   rosuvastatin  (CRESTOR ) 10 MG tablet Take 1 tablet (10 mg total) by mouth daily. 90 tablet 3   solifenacin  (VESICARE ) 5 MG tablet Take 1 tablet (5 mg total) by mouth daily. 30 tablet 11   tadalafil  (CIALIS ) 5 MG tablet Take 1  tablet (5 mg total) by mouth daily. 30 tablet 11   traZODone  (DESYREL ) 100 MG tablet Take 0.5 tablets (50 mg total) by mouth at bedtime as needed and may repeat dose one time if needed for sleep. 30 tablet 0   valACYclovir  (VALTREX ) 500 MG tablet TAKE 1 TABLET BY MOUTH TWICE DAILY 14 tablet 5   Vitamin D , Ergocalciferol , (DRISDOL ) 1.25 MG (50000 UNIT) CAPS capsule TAKE 1 CAPSULE BY MOUTH EVERY 7 DAYS 12 capsule 1   No current facility-administered medications on file prior to visit.    Allergies  Allergen Reactions   Chantix  [Varenicline ] Other (See Comments)    Bad dreams   Oxycodone  Itching    Social History   Socioeconomic History   Marital status: Divorced    Spouse name: Not on file   Number of children: 1   Years of education: Not on file   Highest education level: Some college, no degree  Occupational History   Occupation: Disabled  Tobacco Use   Smoking  status: Every Day    Current packs/day: 0.50    Average packs/day: 0.5 packs/day for 34.5 years (17.3 ttl pk-yrs)    Types: Cigarettes    Start date: 41   Smokeless tobacco: Never   Tobacco comments:    not ready to quit.  Vaping Use   Vaping status: Never Used  Substance and Sexual Activity   Alcohol use: Yes    Comment: socially   Drug use: Yes    Frequency: 7.0 times per week    Types: Marijuana   Sexual activity: Not Currently    Partners: Male    Comment: declined condoms  Other Topics Concern   Not on file  Social History Narrative   Not on file   Social Drivers of Health   Financial Resource Strain: High Risk (01/02/2024)   Overall Financial Resource Strain (CARDIA)    Difficulty of Paying Living Expenses: Very hard  Food Insecurity: Food Insecurity Present (01/02/2024)   Hunger Vital Sign    Worried About Running Out of Food in the Last Year: Often true    Ran Out of Food in the Last Year: Often true  Transportation Needs: No Transportation Needs (01/02/2024)   PRAPARE - Doctor, general practice (Medical): No    Lack of Transportation (Non-Medical): No  Physical Activity: Inactive (01/02/2024)   Exercise Vital Sign    Days of Exercise per Week: 0 days    Minutes of Exercise per Session: Not on file  Stress: Stress Concern Present (01/02/2024)   [employer] of Occupational Health - Occupational Stress Questionnaire    Feeling of Stress: Very much  Social Connections: Moderately Isolated (01/02/2024)   Social Connection and Isolation Panel    Frequency of Communication with Friends and Family: More than three times a week    Frequency of Social Gatherings with Friends and Family: Once a week    Attends Religious Services: Never    Database administrator or Organizations: No    Attends Engineer, structural: Not on file    Marital Status: Living with partner  Intimate Partner Violence: Not At Risk (09/07/2023)   Humiliation, Afraid, Rape, and Kick questionnaire    Fear of Current or Ex-Partner: No    Emotionally Abused: No    Physically Abused: No    Sexually Abused: No    Family History  Problem Relation Age of Onset   Drug abuse Mother    Alcohol abuse Mother    Esophageal cancer Mother 68       smoker/used ETOH   Alcohol abuse Father    Drug abuse Father    Pneumonia Father    Diabetes Father    Hypertension Sister    Crohn's disease Brother    Colon cancer Maternal Grandfather    Crohn's disease Maternal Grandmother    Cancer Maternal Grandmother        patient unsure of type   Colon polyps Neg Hx    Rectal cancer Neg Hx    Stomach cancer Neg Hx     Past Surgical History:  Procedure Laterality Date   COLONOSCOPY  2019   JMP-MAC-prep good-polyps+   ESOPHAGOGASTRODUODENOSCOPY  03/27/2012   Procedure: ESOPHAGOGASTRODUODENOSCOPY (EGD);  Surgeon: Jerrell KYM Sol, MD;  Location: [facility];  Service: Endoscopy;  Laterality: N/A;   INCISION AND DRAINAGE PERIRECTAL ABSCESS N/A 06/25/2023   Procedure: IRRIGATION AND DEBRIDEMENT  PERIRECTAL ABSCESS;  Surgeon: Paola Dreama SAILOR, MD;  Location: [facility];  Service:  General;  Laterality: N/A;   IRRIGATION AND DEBRIDEMENT ABSCESS N/A 03/13/2015   Procedure: IRRIGATION AND DEBRIDEMENT ABSCESS;  Surgeon: Donnice Lunger, MD;  Location: [facility];  Service: General;  Laterality: N/A;   WISDOM TOOTH EXTRACTION      ROS: Review of Systems Negative except as stated above  PHYSICAL EXAM: BP 131/86 (BP Location: Left Arm, Patient Position: Sitting, Cuff Size: Normal)   Pulse 63   Temp 98.5 F (36.9 C) (Oral)   Ht 5' 8 (1.727 m)   Wt 226 lb 6.4 oz (102.7 kg)   SpO2 98%   BMI 34.42 kg/m   Wt Readings from Last 3 Encounters:  01/02/24 226 lb 6.4 oz (102.7 kg)  12/28/23 229 lb (103.9 kg)  12/16/23 232 lb (105.2 kg)    Physical Exam   General appearance -patient is in NAD Mental status -patient is somewhat withdrawn.  Does not have good eye contact.  He started crying after I told him his blood pressure was elevated stating that he can not afford to have something wrong with him Chest - clear to auscultation, no wheezes, rales or rhonchi, symmetric air entry Heart - normal rate, regular rhythm, normal S1, S2, no murmurs, rubs, clicks or gallops Musculoskeletal -knees: No joint enlargement.  No point tenderness along the medial or lateral joint lines.  He has good range of motion.  No crepitus palpated. Lumbar spine: No tenderness on palpation of the lumbar spine or surrounding paraspinal muscles.  He appears uncomfortable with attempted straight leg raise on both sides. Extremities -no lower extremity edema     Latest Ref Rng & Units 10/12/2023   10:07 AM 06/27/2023    5:59 AM 06/25/2023    6:04 AM  CMP  Glucose 65 - 99 mg/dL 87  98  99   BUN 7 - 25 mg/dL 9  13  10    Creatinine 0.70 - 1.30 mg/dL 8.77  9.02  9.01   Sodium 135 - 146 mmol/L 139  137  137   Potassium 3.5 - 5.3 mmol/L 4.2  3.7  3.7   Chloride 98 - 110 mmol/L 102  104  103   CO2 20 - 32 mmol/L 28  25  26     Calcium  8.6 - 10.3 mg/dL 9.8  8.2  8.6   Total Protein 6.1 - 8.1 g/dL 7.5     Total Bilirubin 0.2 - 1.2 mg/dL 0.3     AST 10 - 35 U/L 19     ALT 9 - 46 U/L 21      Lipid Panel     Component Value Date/Time   CHOL 200 (H) 04/08/2023 0914   TRIG 130 04/08/2023 0914   HDL 53 04/08/2023 0914   CHOLHDL 3.8 04/08/2023 0914   CHOLHDL 4.7 08/09/2019 0922   VLDL 28 05/12/2016 0955   LDLCALC 124 (H) 04/08/2023 0914   LDLCALC  08/09/2019 0922     Comment:     . LDL cholesterol not calculated. Triglyceride levels greater than 400 mg/dL invalidate calculated LDL results. . Reference range: <100 . Desirable range <100 mg/dL for primary prevention;   <70 mg/dL for patients with CHD or diabetic patients  with > or = 2 CHD risk factors. SABRA LDL-C is now calculated using the Martin-Hopkins  calculation, which is a validated novel method providing  better accuracy than the Friedewald equation in the  estimation of LDL-C.  Lunger APPLETHWAITE et al. SANDREA. 7986;689(80): 2061-2068  (http://education.[facility].com/faq/FAQ164)     CBC  Component Value Date/Time   WBC 7.7 10/12/2023 1007   RBC 5.06 10/12/2023 1007   HGB 15.2 10/12/2023 1007   HGB 14.6 08/30/2023 1101   HCT 45.2 10/12/2023 1007   HCT 44.6 08/30/2023 1101   PLT 224 10/12/2023 1007   PLT 245 08/30/2023 1101   MCV 89.3 10/12/2023 1007   MCV 92 08/30/2023 1101   MCH 30.0 10/12/2023 1007   MCHC 33.6 10/12/2023 1007   RDW 13.9 10/12/2023 1007   RDW 13.8 08/30/2023 1101   LYMPHSABS 2.1 06/24/2023 1211   LYMPHSABS CANCELED 03/18/2023 0000   MONOABS 1.2 (H) 06/24/2023 1211   EOSABS 0.0 06/24/2023 1211   EOSABS CANCELED 03/18/2023 0000   BASOSABS 0.0 06/24/2023 1211   BASOSABS CANCELED 03/18/2023 0000    ASSESSMENT AND PLAN: 1. Tobacco dependence (Primary) Strongly advised to quit.  Patient is not ready to give a trial of quitting citing stress as a major factor at this time  2. Mixed hyperlipidemia Continue  rosuvastatin  10 mg daily  3. Nonobstructive atherosclerosis of coronary artery Continue rosuvastatin .  Prescription sent for baby aspirin  - aspirin  EC 81 MG tablet; Take 1 tablet (81 mg total) by mouth daily. Swallow whole.  Dispense: 100 tablet; Refill: 1  4. Elevated blood pressure reading DASH diet discussed and encouraged.  Will have him follow-up with the clinical pharmacist in a few weeks for blood pressure recheck.  Patient is distressed and tearful on today's visit which could be playing a role.  5. Class 1 obesity due to excess calories with serious comorbidity and body mass index (BMI) of 34.0 to 34.9 in adult Down 6 pounds since last visit though the weight loss is due to food insecurity.  Encouraged to eat healthy with what he may be able to afford.  Will give information on other food pantry's in the [place] area  6. Stressful life event affecting family Patient is plugged in with mental health services  7. Acute pain of both knees - acetaminophen  (TYLENOL  8 HOUR) 650 MG CR tablet; Take 1 tablet (650 mg total) by mouth every 8 (eight) hours as needed for pain.  Dispense: 30 tablet; Refill: 1 - AMB referral to orthopedics - DG Knee Complete 4 Views Left; Future - DG Knee Complete 4 Views Right; Future  8. Chronic low back pain without sciatica, unspecified back pain laterality - Ambulatory referral to Physical Therapy - AMB referral to orthopedics - DG Lumbar Spine Complete; Future  9. Diet contains inadequate amounts of food See #5 above  10. Need for shingles vaccine 2nd shingles vaccine given today  11. Encounter for screening involving social determinants of health (SDoH) - AMB Referral [facility] Care Management   Patient was given the opportunity to ask questions.  Patient verbalized understanding of the plan and was able to repeat key elements of the plan.   This documentation was completed using Paediatric nurse.  Any transcriptional errors  are unintentional.  Orders Placed This Encounter  Procedures   DG Knee Complete 4 Views Left   DG Knee Complete 4 Views Right   DG Lumbar Spine Complete   Varicella-zoster vaccine IM   Ambulatory referral to Physical Therapy   AMB referral to orthopedics   AMB Referral [facility]I Care Management     Requested Prescriptions   Signed Prescriptions Disp Refills   acetaminophen  (TYLENOL  8 HOUR) 650 MG CR tablet 30 tablet 1    Sig: Take 1 tablet (650 mg total) by mouth every 8 (eight) hours  as needed for pain.   aspirin  EC 81 MG tablet 100 tablet 1    Sig: Take 1 tablet (81 mg total) by mouth daily. Swallow whole.    Return in about 5 months (around 06/03/2024) for Appt with [facility] in 2-3 wks for BP check.  Barnie Louder, MD, FACP

## 2024-01-04 ENCOUNTER — Ambulatory Visit: Payer: Self-pay | Admitting: Internal Medicine

## 2024-01-04 LAB — CYTOLOGY - PAP
Comment: NEGATIVE
Diagnosis: UNDETERMINED — AB
High risk HPV: NEGATIVE

## 2024-01-05 ENCOUNTER — Telehealth: Payer: Self-pay | Admitting: Internal Medicine

## 2024-01-05 ENCOUNTER — Ambulatory Visit: Payer: MEDICAID | Admitting: Neurology

## 2024-01-05 ENCOUNTER — Encounter: Payer: Self-pay | Admitting: Neurology

## 2024-01-05 VITALS — BP 122/80 | HR 74 | Resp 15 | Ht 68.0 in | Wt 222.5 lb

## 2024-01-05 DIAGNOSIS — G3184 Mild cognitive impairment, so stated: Secondary | ICD-10-CM | POA: Diagnosis not present
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
## 2024-01-05 NOTE — Telephone Encounter (Signed)
 RPR from 7/2 routed to DIS.   Lyssa Hackley, BSN, RN

## 2024-01-05 NOTE — Progress Notes (Signed)
 Thank you [employer]

## 2024-01-05 NOTE — Patient Instructions (Signed)
 MRI Brain without contrast  Referral for a formal neuropsychological testing  Continue your current medications  Continue to follow up with your doctors  Return as needed    There are well-accepted and sensible ways to reduce risk for Alzheimers disease and other degenerative brain disorders .  Exercise Daily Walk A daily 20 minute walk should be part of your routine. Disease related apathy can be a significant roadblock to exercise and the only way to overcome this is to make it a daily routine and perhaps have a reward at the end (something your loved one loves to eat or drink perhaps) or a personal trainer coming to the home can also be very useful. Most importantly, the patient is much more likely to exercise if the caregiver / spouse does it with him/her. In general a structured, repetitive schedule is best.  General Health: Any diseases which effect your body will effect your brain such as a pneumonia, urinary infection, blood clot, heart attack or stroke. Keep contact with your primary care doctor for regular follow ups.  Sleep. A good nights sleep is healthy for the brain. Seven hours is recommended. If you have insomnia or poor sleep habits we can give you some instructions. If you have sleep apnea wear your mask.  Diet: Eating a heart healthy diet is also a good idea; fish and poultry instead of red meat, nuts (mostly non-peanuts), vegetables, fruits, olive oil or canola oil (instead of butter), minimal salt (use other spices to flavor foods), whole grain rice, bread, cereal and pasta and wine in moderation.Research is now showing that the MIND diet, which is a combination of The Mediterranean diet and the DASH diet, is beneficial for cognitive processing and longevity. Information about this diet can be found in The MIND Diet, a book by Annitta Feeling, MS, RDN, and online at [employer]  Finances, Power of [address] and Advance Directives: You should  consider putting legal safeguards in place with regard to financial and medical decision making. While the spouse always has power of attorney for medical and financial issues in the absence of any form, you should consider what you want in case the spouse / caregiver is no longer around or capable of making decisions.

## 2024-01-05 NOTE — Telephone Encounter (Signed)
 Selinda of [employer] called requesting lab results and rpr for Posey. Selinda can be reached at [phone].

## 2024-01-09 ENCOUNTER — Telehealth: Payer: Self-pay | Admitting: Neurology

## 2024-01-09 NOTE — Telephone Encounter (Signed)
 [facility] Phone: [phone], Fax: [phone]

## 2024-01-11 ENCOUNTER — Ambulatory Visit: Payer: MEDICAID | Admitting: Orthopaedic Surgery

## 2024-01-12 ENCOUNTER — Other Ambulatory Visit: Payer: Self-pay

## 2024-01-12 ENCOUNTER — Ambulatory Visit: Payer: MEDICAID | Attending: Internal Medicine | Admitting: Physical Therapy

## 2024-01-12 ENCOUNTER — Encounter: Payer: Self-pay | Admitting: Physical Therapy

## 2024-01-12 ENCOUNTER — Ambulatory Visit: Payer: MEDICAID | Admitting: Internal Medicine

## 2024-01-12 DIAGNOSIS — M545 Low back pain, unspecified: Secondary | ICD-10-CM | POA: Insufficient documentation

## 2024-01-12 DIAGNOSIS — M5459 Other low back pain: Secondary | ICD-10-CM | POA: Insufficient documentation

## 2024-01-12 DIAGNOSIS — G8929 Other chronic pain: Secondary | ICD-10-CM | POA: Insufficient documentation

## 2024-01-12 DIAGNOSIS — M7099 Unspecified soft tissue disorder related to use, overuse and pressure multiple sites: Secondary | ICD-10-CM | POA: Diagnosis present

## 2024-01-12 NOTE — Progress Notes (Addendum)
 BH MD Outpatient Progress Note  01/26/2024 9:36 AM Marke[place] VINEETH FELL  MRN:  [id number]  Assessment:  Shawn Brooks Lowers presents for follow-up evaluation in-person.  He is being followed up in the clinic for major depressive disorder, taking Prozac  80 mg and Lamictal  25 mg daily for that.  He also has sleep issues for which he takes trazodone  100 mg nightly currently and melatonin 10 mg at bedtime.  He is also taking propranolol  10 mg twice daily for social anxiety.  Patient has been stable on the current dose of medications, has denied any side effects or any physical concerns.  He does report that he has not been sleeping well, discussed about increasing her dose of trazodone  to 150 mg nightly, and continuing to self taper, patient amenable to the plan.  His mood has improved and he has denied any active SI/HI/AVH.  He continues to have anxiety due to financial constraints and issues among his family, motivational counseling done.  He is currently not undergoing therapy, encouraged to keep up with a new appointment on August 11 with his therapist (patient reported that he has scheduled).  His alcohol use has decreased, last use was a month ago and has marijuana use has decreased as well, currently smokes 1 g once a week.  He took trazodone  more than prescribed for which he eventually ran out last week, encouraged to continue taking medicines as prescribed, patient amenable.  Return to the clinic in 6 weeks.  Risk Assessment: A suicide and violence risk assessment was performed as part of this evaluation. There patient is deemed to be at chronic elevated risk for self-harm/suicide given the following factors: suicidal ideation or threats without a plan, feelings of hopelessness, lack of social support, and current substance abuse. These risk factors are mitigated by the following factors: no known access to weapons or firearms, no history of violence, motivation for treatment, utilization of positive coping  skills, expresses purpose for living, current treatment compliance, effective problem solving skills, and safe housing. The patient is deemed to be at chronic elevated risk for violence given the following factors: homicidal ideation and current substance abuse. These risk factors are mitigated by the following factors: no known history of violence towards others, no known history of threats of harm towards others, and low impulsivity. There is no acute risk for suicide or violence at this time. The patient was educated about relevant modifiable risk factors including following recommendations for treatment of psychiatric illness and abstaining from substance abuse.  While future psychiatric events cannot be accurately predicted, the patient does not currently require  acute inpatient psychiatric care and does not currently meet [facility]  involuntary commitment criteria.    Identifying Information: Shawn Brooks is a 48 y.o. male with historical diagnosis of MDD, social anxiety disorder, HIV on HAART threapy, HLD, and Vitamin D  deficiency who is an established patient with [facility] for medication management. He reports progressively worsening memory problems and chronically struggles with insomnia. Memory problems currently hypothesized secondary to both insufficient treatment of MDD and possibly cannabis use although uncertain if more is contributing as his memory problems do not correlate with worsening depression. Sleep study did not show signs of OSA. Diagnosis of MDD made based on patient's depressed mood, anhedonia, poor sleep, poor appetite, inattention. MOCA performed 06/02/23 was 27/30.  Plan:  # Major depressive disorder-recurrent episode, severe with psychotic features Past medication trials: Zoloft  Status of problem: stable Interventions: -- Continue Prozac  80 mg daily --  Patient currently stable on Lamictal  25 mg daily, continue taking it   #Social Anxiety  Disorder Unclear etiology. 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 -Continue propranolol  10 mg twice daily   # Hypersomnia Past medication trials:  Status of problem: stable Interventions: -- Increase trazodone  to 150 mg nightly -- sleep study was inconclusive the last time  REM Sleep Behavior Disorder -stopped -increase melatonin to 10 mg at bedtime- doesn't work -referral sleep to sleep medicine  Cannabis Use Disorder Substance induced mood disorder -advise decrease use, reported use last week, has been self decreasing   # EtOH use disorder, in early remission Past medication trials: none Status of problem: improving Interventions: -- 3-4 beers per month -- Psychoeducation provided regarding risks of dependency, tolerance, and withdrawal as well as safe limits of use; patient expresses intent to continue to reduce use   # Memory problems -Unclear etiology but differential includes chronic cannabis use, pseudodementia 2/2 MDD, wernicke's encepahlopathy versus HIV dementia versus iatrogenic   Folate deficiency Vitamin D  deficiency -Vitamin D  supplement -Multivitamin with folate  Patient was given contact information for behavioral health clinic and was instructed to call 911 for emergencies.   Subjective:  Chief Complaint:  Medication management  Interval History:  Today, patient reports an improvement in his mood over the past week as he was facing financial issues that have resolved a little bit.  He reports his mood as better .  Endorses passive suicidal ideation without a plan, denies HI/AVH.  Reports that he would never hurt himself, his coping skills include his daughter, partner.  He has been having some financial stressors leading to increased anxiety but reports that his anxiety has improved as well after resolution of some issues.  He denies any auditory or visual hallucinations.  Reports good appetite, poor sleep reporting trazodone  has not been helpful  at the current dose.  He reports that he was on 600 mg earlier that has been tapered down to 100 mg daily, discussed about increasing to 150 mg nightly, continuing self taper, patient amenable. Reports that he was undergoing group therapy earlier, is currently scheduled a visit with a new therapist for 11th of next month, encouraged keeping up with the appointment and doing therapy.  He denies using any new substances, reports that he has decreased his alcohol intake, last use was a month ago, reports that he still uses marijuana, 1 g with last use about a week ago.  He has continued to take all the medications, denies any side effects, although he had to increase the dose of trazodone  for his sleep over the past week and eventually ran out of his prescription, recommended continued taking prescriptions as prescribed.  Plan to follow up with him in 6 weeks.  Visit Diagnosis:    ICD-10-CM   1. Recurrent major depressive disorder, in partial remission (HCC)  F33.41 FLUoxetine  (PROZAC ) 40 MG capsule    propranolol  (INDERAL ) 10 MG tablet    traZODone  (DESYREL ) 150 MG tablet    2. 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 Melatonin 10 MG CAPS      Past Psychiatric History:  Diagnoses: Historical diagnosis of bipolar 1 disorder Medication trials: Zoloft  Substance use:             -- Etoh: Used to have 6 beers daily; denies history of withdrawal; now on 1 beer every ~2 weeks             -- Cannabis: Daily multiple blunts             --  Denies use of illicit drugs  Past Medical History:  Past Medical History:  Diagnosis Date   Anal condyloma    Anxiety    Blood dyscrasia    HIV   Chronic back pain    Constipation    takes miralax  daily   Depression    on meds   DJD (degenerative joint disease)    Gastric ulcer    GERD (gastroesophageal reflux disease)    Headache(784.0)    Hiatal hernia    HIV infection (HCC)    on meds   Hyperplastic colon polyp    Hypertension    on meds   Internal  hemorrhoids    Sickle cell anemia (HCC)     Past Surgical History:  Procedure Laterality Date   COLONOSCOPY  2019   JMP-MAC-prep good-polyps+   ESOPHAGOGASTRODUODENOSCOPY  03/27/2012   Procedure: ESOPHAGOGASTRODUODENOSCOPY (EGD);  Surgeon: Jerrell KYM Sol, MD;  Location: [facility];  Service: Endoscopy;  Laterality: N/A;   INCISION AND DRAINAGE PERIRECTAL ABSCESS N/A 06/25/2023   Procedure: IRRIGATION AND DEBRIDEMENT PERIRECTAL ABSCESS;  Surgeon: Paola Dreama SAILOR, MD;  Location: [facility];  Service: General;  Laterality: N/A;   IRRIGATION AND DEBRIDEMENT ABSCESS N/A 03/13/2015   Procedure: IRRIGATION AND DEBRIDEMENT ABSCESS;  Surgeon: Donnice Lunger, MD;  Location: [facility];  Service: General;  Laterality: N/A;   WISDOM TOOTH EXTRACTION      Family History:  Family History  Problem Relation Age of Onset   Drug abuse Mother    Alcohol abuse Mother    Esophageal cancer Mother 74       smoker/used ETOH   Alcohol abuse Father    Drug abuse Father    Pneumonia Father    Diabetes Father    Hypertension Sister    Crohn's disease Brother    Colon cancer Maternal Grandfather    Crohn's disease Maternal Grandmother    Cancer Maternal Grandmother        patient unsure of type   Colon polyps Neg Hx    Rectal cancer Neg Hx    Stomach cancer Neg Hx     Social History:  Social History   Socioeconomic History   Marital status: Divorced    Spouse name: Not on file   Number of children: 1   Years of education: Not on file   Highest education level: Some college, no degree  Occupational History   Occupation: Disabled  Tobacco Use   Smoking status: Every Day    Current packs/day: 0.50    Average packs/day: 0.5 packs/day for 34.6 years (17.3 ttl pk-yrs)    Types: Cigarettes    Start date: 1991   Smokeless tobacco: Never   Tobacco comments:    not ready to quit.  Vaping Use   Vaping status: Never Used  Substance and Sexual Activity   Alcohol use: Yes    Comment: socially    Drug use: Yes    Frequency: 7.0 times per week    Types: Marijuana   Sexual activity: Not Currently    Partners: Male    Comment: declined condoms  Other Topics Concern   Not on file  Social History Narrative   Not on file   Social Drivers of Health   Financial Resource Strain: High Risk (01/23/2024)   Overall Financial Resource Strain (CARDIA)    Difficulty of Paying Living Expenses: Very hard  Food Insecurity: Food Insecurity Present (01/02/2024)   Hunger Vital Sign    Worried About Radiation protection practitioner of Food  in the Last Year: Often true    Ran Out of Food in the Last Year: Often true  Transportation Needs: Unmet Transportation Needs (01/23/2024)   PRAPARE - Transportation    Lack of Transportation (Medical): Yes    Lack of Transportation (Non-Medical): Yes  Physical Activity: Inactive (01/02/2024)   Exercise Vital Sign    Days of Exercise per Week: 0 days    Minutes of Exercise per Session: Not on file  Stress: Stress Concern Present (01/02/2024)   [employer] of Occupational Health - Occupational Stress Questionnaire    Feeling of Stress: Very much  Social Connections: Moderately Isolated (01/02/2024)   Social Connection and Isolation Panel    Frequency of Communication with Friends and Family: More than three times a week    Frequency of Social Gatherings with Friends and Family: Once a week    Attends Religious Services: Never    Database administrator or Organizations: No    Attends Engineer, structural: Not on file    Marital Status: Living with partner    Allergies:  Allergies  Allergen Reactions   Chantix  [Varenicline ] Other (See Comments)    Bad dreams   Oxycodone  Itching    Current Medications: Current Outpatient Medications  Medication Sig Dispense Refill   acetaminophen  (TYLENOL  8 HOUR) 650 MG CR tablet Take 1 tablet (650 mg total) by mouth every 8 (eight) hours as needed for pain. 30 tablet 1   albuterol  (VENTOLIN  HFA) 108 (90 Base) MCG/ACT inhaler  Inhale 2 puffs into the lungs every 6 (six) hours as needed for wheezing or shortness of breath. 18 g 1   AMBULATORY NON FORMULARY MEDICATION Medication Name: Diltiazem  2% gel:Lidocaine  5% - using your index finger, you should apply a small amount of medication inside the rectum up to your first knuckle/joint three times daily x 6-8 weeks. 45 g 1   aspirin  EC 81 MG tablet Take 1 tablet (81 mg total) by mouth daily. Swallow whole. 100 tablet 1   bictegravir-emtricitabine -tenofovir  AF (BIKTARVY) 50-200-25 MG TABS tablet Take 1 tablet by mouth daily. 30 tablet 11   Eluxadoline  (VIBERZI ) 100 MG TABS TAKE 1 TABLET BY MOUTH 1-2 TIMES DAILY 180 tablet 0   FLUoxetine  (PROZAC ) 40 MG capsule Take 2 capsules (80 mg total) by mouth daily. 180 capsule 0   lamoTRIgine  (LAMICTAL ) 25 MG tablet Take 1 tablet (25 mg total) by mouth daily. 30 tablet 0   Melatonin 10 MG CAPS Take 10 mg by mouth at bedtime. 90 capsule 0   nicotine  (NICODERM CQ  - DOSED IN MG/24 HOURS) 21 mg/24hr patch Place 1 patch (21 mg total) onto the skin daily. 28 patch 1   propranolol  (INDERAL ) 10 MG tablet Take 1 tablet (10 mg total) by mouth 2 (two) times daily as needed (anxiety). 60 tablet 0   rosuvastatin  (CRESTOR ) 10 MG tablet Take 1 tablet (10 mg total) by mouth daily. 90 tablet 3   solifenacin  (VESICARE ) 5 MG tablet Take 1 tablet (5 mg total) by mouth daily. 30 tablet 11   tadalafil  (CIALIS ) 5 MG tablet Take 1 tablet (5 mg total) by mouth daily. 30 tablet 11   traMADol  (ULTRAM ) 50 MG tablet Take 1 tablet (50 mg total) by mouth every 12 (twelve) hours as needed. 15 tablet 0   traZODone  (DESYREL ) 150 MG tablet Take 1 tablet (150 mg total) by mouth at bedtime. 30 tablet 1   valACYclovir  (VALTREX ) 500 MG tablet TAKE 1 TABLET BY MOUTH TWICE DAILY  14 tablet 5   vancomycin  (VANCOCIN ) 125 MG capsule Take 1 capsule (125 mg total) by mouth 3 (three) times daily for 10 days. 30 capsule 0   Vitamin D , Ergocalciferol , (DRISDOL ) 1.25 MG (50000 UNIT) CAPS  capsule TAKE 1 CAPSULE BY MOUTH EVERY 7 DAYS 12 capsule 1   No current facility-administered medications for this visit.    ROS: Review of Systems  Constitutional:  Negative for activity change, appetite change, chills, diaphoresis and fatigue.  HENT:  Negative for congestion, dental problem, drooling, ear discharge and ear pain.   Eyes:  Negative for pain, discharge and itching.  Respiratory:  Negative for apnea, cough, choking and chest tightness.   Cardiovascular:  Negative for chest pain, palpitations and leg swelling.  Gastrointestinal:  Negative for abdominal distention, abdominal pain, constipation, diarrhea and nausea.  Endocrine: Negative for cold intolerance and heat intolerance.  Genitourinary:  Negative for difficulty urinating, dysuria, flank pain, frequency, hematuria and urgency.  Musculoskeletal:  Negative for arthralgias, back pain, gait problem, joint swelling, myalgias and neck pain.  Skin:  Negative for color change and pallor.  Allergic/Immunologic: Negative for environmental allergies and food allergies.  Neurological:  Negative for dizziness, seizures, syncope, facial asymmetry, speech difficulty, light-headedness, numbness and headaches.  Psychiatric/Behavioral:  Negative for agitation, behavioral problems, confusion, decreased concentration, dysphoric mood, hallucinations, self-injury, sleep disturbance and suicidal ideas. The patient is not nervous/anxious and is not hyperactive.     Objective:  Psychiatric Specialty Exam: Blood pressure (!) 128/91, pulse (!) 102, height 5' 8 (1.727 m), weight 223 lb (101.2 kg).Body mass index is 33.91 kg/m.  General Appearance: Casual  Eye Contact:  Good  Speech:  Clear and Coherent and Normal Rate  Volume:  Normal  Mood:  Depressed  Affect:  Depressed and Tearful  Thought Process:  Coherent, Goal Directed, and Linear  Orientation:  Full (Time, Place, and Person)  Thought Content: Logical   Suicidal Thoughts:  No   Homicidal Thoughts:  No  Memory:  Remote;   Good  Judgment:  Fair  Insight:  Fair  Psychomotor Activity:  Normal  Concentration:  Concentration: Good and Attention Span: Good              Assets:  Communication Skills Desire for Improvement Financial Resources/Insurance Housing Intimacy Leisure Time Physical Health Resilience Social Support Talents/Skills Transportation Vocational/Educational  ADL's:  Intact  Cognition: WNL  Sleep:  Good   PE: General: well-appearing; no acute distress  Pulm: no increased work of breathing on room air  Strength & Muscle Tone: within normal limits Neuro: no focal neurological deficits observed  Gait & Station: normal  Metabolic Disorder Labs: Lab Results  Component Value Date   HGBA1C 5.7 04/08/2023   MPG 117 (H) 11/16/2013   No results found for: PROLACTIN Lab Results  Component Value Date   CHOL 200 (H) 04/08/2023   TRIG 130 04/08/2023   HDL 53 04/08/2023   CHOLHDL 3.8 04/08/2023   VLDL 28 05/12/2016   LDLCALC 124 (H) 04/08/2023   LDLCALC 173 (H) 10/07/2022   Lab Results  Component Value Date   TSH 2.370 03/18/2023   TSH 1.150 09/01/2017    Therapeutic Level Labs: No results found for: LITHIUM No results found for: VALPROATE No results found for: CBMZ  Screenings: GAD-7    Flowsheet Row Office Visit from 10/12/2023 in [facility] - A Dept Of [place]. [facility] Office Visit from 08/30/2023 in [facility] [place] -  A Dept Of [place]. [facility] Office Visit from 04/08/2023 in [facility] [place] - A Dept Of [place]. [facility] Office Visit from 10/07/2022 in [facility] [place] - A Dept Of [place]. [facility] Video Visit from 03/18/2022 in [facility]  Total GAD-7 Score 19 7 15  0 16   Mini-Mental    Flowsheet Row Office Visit from 01/05/2024 in [facility]  Total Score (max 30 points ) 28   PHQ2-9    Flowsheet Row Clinical Support from 01/26/2024 in [facility] Office Visit from 01/16/2024 in [facility] [place] - A Dept Of [place]. [facility] Counselor from 10/26/2023 in [facility] Counselor from 10/19/2023 in [facility] Office Visit from 10/12/2023 in [facility] - A Dept Of [place]. [facility]  PHQ-2 Total Score 5 6 6 6 6   PHQ-9 Total Score 15 16 21 23 23    Flowsheet Row Counselor from 10/26/2023 in [facility] Counselor from 10/19/2023 in [facility] ED to Hosp-Admission (Discharged) from 06/23/2023 in [facility]  SURGICAL  C-SSRS RISK CATEGORY Moderate Risk Moderate Risk No Risk    Collaboration of Care: Collaboration of Care:   Patient/Guardian was advised Release of Information must be obtained prior to any record release in order to collaborate their care with an outside provider. Patient/Guardian was advised if they have not already done so to contact the registration department to sign all necessary forms in order for us  to release information regarding their care.   Consent: Patient/Guardian gives verbal consent for treatment and assignment of benefits for services provided during this visit. Patient/Guardian expressed understanding and agreed to proceed.   A total of 30 minutes was spent involved in face to face clinical care, chart review, and documentation.   Dailen Mcclish, MD 01/26/2024, 9:36 AM

## 2024-01-12 NOTE — Therapy (Signed)
 OUTPATIENT PHYSICAL THERAPY THORACOLUMBAR EVALUATION   Patient Name: [name] MRN: [id number] DOB:[birth year], 48 y.o., male Today's Date: 01/12/2024  END OF SESSION:  PT End of Session - 01/12/24 0824     Visit Number 1    Number of Visits 1    PT Start Time 0825    PT Stop Time 0910    PT Time Calculation (min) 45 min          Past Medical History:  Diagnosis Date   Anal condyloma    Anxiety    Blood dyscrasia    HIV   Chronic back pain    Constipation    takes miralax  daily   Depression    on meds   DJD (degenerative joint disease)    Gastric ulcer    GERD (gastroesophageal reflux disease)    Headache(784.0)    Hiatal hernia    HIV infection (HCC)    on meds   Hyperplastic colon polyp    Hypertension    on meds   Internal hemorrhoids    Sickle cell anemia (HCC)    Past Surgical History:  Procedure Laterality Date   COLONOSCOPY  2019   JMP-MAC-prep good-polyps+   ESOPHAGOGASTRODUODENOSCOPY  03/27/2012   Procedure: ESOPHAGOGASTRODUODENOSCOPY (EGD);  Surgeon: Jerrell KYM Sol, MD;  Location: [facility];  Service: Endoscopy;  Laterality: N/A;   INCISION AND DRAINAGE PERIRECTAL ABSCESS N/A 06/25/2023   Procedure: IRRIGATION AND DEBRIDEMENT PERIRECTAL ABSCESS;  Surgeon: Paola Dreama SAILOR, MD;  Location: [facility];  Service: General;  Laterality: N/A;   IRRIGATION AND DEBRIDEMENT ABSCESS N/A 03/13/2015   Procedure: IRRIGATION AND DEBRIDEMENT ABSCESS;  Surgeon: Donnice Lunger, MD;  Location: [facility];  Service: General;  Laterality: N/A;   WISDOM TOOTH EXTRACTION     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   Perianal abscess 06/24/2023   Acute urinary retention 06/24/2023   AKI (acute kidney injury) (HCC) 06/24/2023   Anal fissure 06/17/2023   Folate deficiency 04/08/2023   Obstructive sleep apnea 03/10/2023   Severe episode of recurrent  major depressive disorder, with psychotic features (HCC) 02/24/2023   Social anxiety disorder 02/24/2023   Class 2 severe obesity due to excess calories with serious comorbidity and body mass index (BMI) of 35.0 to 35.9 in adult [facility]) 10/07/2022   Influenza vaccine refused 06/17/2020   Esophageal dysphagia 06/17/2020   Mixed hyperlipidemia 06/17/2020   Obesity, Class I, BMI 30-34.9 06/17/2020   History of farsightedness 06/17/2020   Chronic night sweats 04/10/2020   Counseled about COVID-19 virus infection 09/18/2019   Urinary frequency 12/26/2018   Medication monitoring encounter 08/08/2018   Memory problem 09/01/2017   Vitamin D  deficiency 09/01/2017   Primary osteoarthritis of left knee 05/02/2017   Prediabetes 12/22/2016   Tobacco abuse 05/31/2016   Lumbar transverse process fracture (HCC) 10/02/2015   Screening examination for sexually transmitted disease 04/04/2014   Thoracic or lumbosacral neuritis or radiculitis 01/15/2013   H/O gastrointestinal disease 01/15/2013   Lumbar radiculopathy 01/15/2013   Carcinoma in situ of anal canal 11/17/2012   Condyloma acuminatum 11/17/2012   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   AGW (anogenital warts) 11/17/2012   Hemorrhoid 09/27/2012   BRBPR (bright red blood per rectum) 07/27/2012   Prostatitis 04/12/2012   Constipation 03/09/2012   Callus 08/16/2011   Ingrown toenail 08/16/2011   Chronic low back pain 08/16/2011  Dyslipidemia 09/03/2010   Herpes simplex virus (HSV) infection 07/06/2010   Erectile dysfunction 07/06/2010   BURSITIS, KNEE 05/06/2010   Human immunodeficiency virus (HIV) disease (HCC) 04/16/2010    PCP: Vicci Barnie NOVAK, MD  REFERRING PROVIDER: Vicci Barnie NOVAK, MD  REFERRING DIAG: [phone] (ICD-10-CM) - Chronic low back pain without sciatica, unspecified back pain laterality   Rationale for Evaluation and Treatment: Rehabilitation  THERAPY DIAG:  Other low back  pain  Unspecified soft tissue disorder related to use, overuse and pressure multiple sites  PERTINENT HISTORY: HIV, anal cancer  WEIGHT BEARING RESTRICTIONS: No  FALLS:  Has patient fallen in last 6 months? Yes. Number of falls 2, one month ago knee gave out  LIVING ENVIRONMENT: Lives with: lives with their partner Lives in: House/apartment Stairs: No Has following equipment at home: None  OCCUPATION: not currently working   PRECAUTIONS: None ---------------------------------------------------------------------------------------------  SUBJECTIVE:                                                                                                                                                                                           SUBJECTIVE STATEMENT: Eval statement 01/12/2024: Back pain onset since 48 years old, initially slipped and fell on his back, had another fall onto brick steps in his 40s and ended up breaking his back and 3 ribs.    Currently 6/10 pain in the lower back N/T down both legs, down to the knees, occasionally to the B ankles   RED FLAGS: None    PLOF: Independent  PATIENT GOALS: not sure  NEXT MD VISIT: need to schedule ---------------------------------------------------------------------------------------------  OBJECTIVE:  Note: Objective measures were completed at Evaluation unless otherwise noted.  DIAGNOSTIC FINDINGS:  IMPRESSION: Mild straightening of the expected lumbar lordosis, nonspecific though could be seen in the setting of muscle spasm. Otherwise, no explanation for patient's low back pain.     Electronically Signed   By: Norleen Roulette M.D.   On: 12/22/2016 13:43  Has another set of imaging ordered  PATIENT SURVEYS:  MODI: 29/50 (58%)  COGNITION: Overall cognitive status: Within functional limits for tasks assessed   PALPATION: Tenderness to lumbar parapsinals, sacral sulcus, and lumbar vertebrae  Lumbar  contraction pattern Unable to tolerate test positions   SENSATION: Light touch: Impaired   MUSCLE LENGTH: Unable to tolerate test positions  POSTURE: flexed trunk  and weight shift left   LUMBAR ROM:   AROM eval  Flexion   Extension   Right lateral flexion   Left lateral flexion   Right rotation   Left rotation    (Blank rows = not tested)  ! Indicates pain with  testing  LOWER EXTREMITY ROM:   limited by pain, unable to tolerate accurate assessment   Active  Right eval Left eval  Hip flexion    Hip extension    Hip abduction    Hip adduction    Hip internal rotation    Hip external rotation    Knee flexion    Knee extension    Ankle dorsiflexion    Ankle plantarflexion    Ankle inversion    Ankle eversion     (Blank rows = not tested)  ! Indicates pain with testing  LOWER EXTREMITY MMT:  unable to tolerate d/t pain  MMT Right eval Left eval  Hip flexion    Hip extension    Hip abduction    Hip adduction    Hip internal rotation    Hip external rotation    Knee flexion    Knee extension    Ankle dorsiflexion    Ankle plantarflexion    Ankle inversion    Ankle eversion     (Blank rows = not tested)   ! Indicates pain with testing LUMBAR SPECIAL TESTS:  Prone instability test: unable to tolerate prone and FABER test: Positive  FUNCTIONAL TESTS:  30 seconds chair stand test  GAIT: Distance walked: 147ft Assistive device utilized: None Level of assistance: Independence Comments: flexed trunk  [facility] Adult PT Treatment:                                                DATE: 01/12/2024  Self Care: Pt education POC discussion                                                                                                                                PATIENT EDUCATION:  Education details: Pt received education regarding HEP performance, ADL performance, functional activity tolerance, impairment education, appropriate performance of therapeutic  activities.  Person educated: Patient and Parent Education method: Explanation, Demonstration, Tactile cues, Verbal cues, and Handouts Education comprehension: verbalized understanding and returned demonstration  HOME EXERCISE PROGRAM: Access Code: [email or web address] URL: [email or web address] Date: 01/12/2024 Prepared by: Mabel Kiang  Exercises - Supine Bridge with Resistance Band  - 1 x daily - 7 x weekly - 2-3 sets - 12 reps - 5s hold - Supine Bent Leg Lift with Arm Extension  - 1 x daily - 7 x weekly - 2-3 sets - 12 reps - 2s hold - Seated Piriformis Stretch with Trunk Bend  - 1 x daily - 7 x weekly - 2 sets - 2 reps - 30s hold - Seated Thoracic Lumbar Extension with Pectoralis Stretch  - 1 x daily - 7 x weekly - 2-3 sets - 12 reps - 10s hold ---------------------------------------------------------------------------------------------  ASSESSMENT:  CLINICAL IMPRESSION: Eval impression (01/12/2024): Pt. attended today's physical therapy session for evaluation  of LBP. Pt has complaints of 6/10 LBP onset for almost 30 years following a fall at 19, and a 2nd fall at 38 breaking multiple vertebrae and ribs. Pt has notable deficits with lumbar AROM, lumbar stability, and dynamic core activation patterns. Pt also has complaints of B numbness and tingling down both legs as well as in L shoulder. Pt has reports of a squeezing feeling in the thoracic spine. While pt would benefit from OPPT, pt requested to be given HEP as a trial run at home first. Pt is currently working with ortho care, I believe pt would also benefit and recommend a referral to rheumatology to rule out delayed onset ankylosing spondylitis. Potential need for neurology referral as well to rule out myelopathy in line with pts reported B neurological symptoms.  Treatment performed today focused on pt education detailed in the objective. Pt demonstrated good understanding of education provided. required minimal cues and no  assistance for appropriate performance with today's activities. Pt requires the intervention of skilled outpatient physical therapy to address the aforementioned deficits and progress towards a functional level in line with therapeutic goals.    OBJECTIVE IMPAIRMENTS: Abnormal gait, decreased activity tolerance, decreased knowledge of condition, decreased mobility, difficulty walking, decreased ROM, decreased strength, hypomobility, increased muscle spasms, improper body mechanics, postural dysfunction, and pain.   ACTIVITY LIMITATIONS: carrying, lifting, bending, standing, squatting, stairs, and locomotion level  PARTICIPATION LIMITATIONS: cleaning, laundry, interpersonal relationship, driving, community activity, and occupation  PERSONAL FACTORS: Fitness, Social background, and Time since onset of injury/illness/exacerbation are also affecting patient's functional outcome.   REHAB POTENTIAL: Fair high pain levels and time since onset  CLINICAL DECISION MAKING: Stable/uncomplicated  EVALUATION COMPLEXITY: Low   GOALS: Goals reviewed with patient? YES  SHORT TERM GOALS: Target date: 01/12/2024   Pt will be independent with administered HEP to demonstrate the competency necessary for long term managemnet of symptoms at home. Baseline: Goal status: INITIAL  ---------------------------------------------------------------------------------------------  PLAN:  PT FREQUENCY: one visit  PT DURATION: one visit  PLANNED INTERVENTIONS: 97110-Therapeutic exercises, 97530- Therapeutic activity, [id number]- Neuromuscular re-education, 97535- Self Care, [id number]- Manual therapy, Patient/Family education, Taping, Joint mobilization, and Spinal mobilization.  PLAN FOR NEXT SESSION: Review HEP, Begin POC as detailed in assessment   Mabel Kiang, PT, DPT 01/12/2024, 9:19 AM   For all possible CPT codes, reference the Planned Interventions line above.     Check all conditions that are expected  to impact treatment: {Conditions expected to impact treatment:None of these apply   If treatment provided at initial evaluation, no treatment charged due to lack of authorization.

## 2024-01-16 ENCOUNTER — Telehealth (HOSPITAL_COMMUNITY): Payer: Self-pay

## 2024-01-16 ENCOUNTER — Ambulatory Visit: Payer: MEDICAID | Attending: Internal Medicine | Admitting: Internal Medicine

## 2024-01-16 ENCOUNTER — Ambulatory Visit: Payer: Self-pay

## 2024-01-16 ENCOUNTER — Encounter: Payer: Self-pay | Admitting: Internal Medicine

## 2024-01-16 VITALS — BP 137/89 | HR 59 | Wt 226.8 lb

## 2024-01-16 DIAGNOSIS — I10 Essential (primary) hypertension: Secondary | ICD-10-CM

## 2024-01-16 DIAGNOSIS — K625 Hemorrhage of anus and rectum: Secondary | ICD-10-CM

## 2024-01-16 DIAGNOSIS — K580 Irritable bowel syndrome with diarrhea: Secondary | ICD-10-CM | POA: Diagnosis not present

## 2024-01-16 DIAGNOSIS — M25512 Pain in left shoulder: Secondary | ICD-10-CM | POA: Diagnosis not present

## 2024-01-16 DIAGNOSIS — R197 Diarrhea, unspecified: Secondary | ICD-10-CM

## 2024-01-16 DIAGNOSIS — G8929 Other chronic pain: Secondary | ICD-10-CM

## 2024-01-16 DIAGNOSIS — R030 Elevated blood-pressure reading, without diagnosis of hypertension: Secondary | ICD-10-CM

## 2024-01-16 DIAGNOSIS — M5416 Radiculopathy, lumbar region: Secondary | ICD-10-CM

## 2024-01-16 MED ORDER — TRAMADOL HCL 50 MG PO TABS: 50.0000 mg | ORAL_TABLET | Freq: Two times a day (BID) | ORAL | 0 refills | Status: DC | PRN

## 2024-01-16 NOTE — Patient Instructions (Signed)
 VISIT SUMMARY:  Today, you were seen for worsening back pain and gastrointestinal symptoms. We discussed your ongoing back pain, which has been troubling you since a slip a few months ago, and your recent flare-up of irritable bowel syndrome. Additionally, you mentioned new shoulder pain that has been bothering you for the past few weeks.  YOUR PLAN:  -CHRONIC BACK PAIN: Chronic back pain can be due to various reasons, including degenerative disc disease and pinched nerves. We have ordered an MRI of your lumbar spine to get a clearer picture of what might be causing your pain. Please complete the x-rays at [facility] as previously ordered. Continue with your physical therapy exercises as tolerated, and keep your appointment with orthopedics later this month. We have started you on a low-dose tramadol , to be taken twice a day, for pain management. If the MRI indicates a need for surgical intervention, we may refer you to a neurosurgeon.  -LEFT SHOULDER PAIN: Your left shoulder pain, particularly under the shoulder blade, may be due to strain or other issues. We have ordered an x-ray of your left shoulder to evaluate the cause of the pain.  -IRRITABLE BOWEL SYNDROME WITH DIARRHEA: Irritable bowel syndrome (IBS) with diarrhea involves frequent bowel movements and can sometimes include blood in the stool. We recommend using Imodium as needed to manage the diarrhea. Additionally, we will collect a stool sample to check for any infection. Please follow up with your gastroenterologist, Dr. Stoney, for further management.  -HYPERTENSION: Your elevated blood pressure may be due to pain or sensitivity to NSAIDs like Aleve . We will monitor your blood pressure and consider alternative pain management options if necessary.  INSTRUCTIONS:  1. Complete the x-rays at [facility] as previously ordered. 2. Schedule and complete an MRI of your lumbar spine. 3. Follow up with your orthopedics appointment  later this month. 4. Use Imodium as needed for diarrhea and collect a stool sample to check for infection. 5. Follow up with your gastroenterologist, Dr. Stoney, for further management of your IBS. 6. Monitor your blood pressure regularly.

## 2024-01-16 NOTE — Progress Notes (Signed)
 Patient ID: Shawn Brooks, male    DOB: 1975-08-18  MRN: [id number]  CC: Back Pain and Medical Management of Chronic Issues (Pt states he has been moving around a lot and has gone to the restroom a total of 10 times yesterday //Knee pain as well )   Subjective: Shawn Brooks is a 48 y.o. male who presents for chronic ds management. His concerns today include:  HIV, DJD,  hyperlipidemia, anxiety, bipolar 1  (followed by psychiatry ), tobacco dependence, vit D deficiency, numbness in hands (neg EMG 2019), chronic bilateral lower back pain , rectal fissure, internal hemorrhoids, PreDM.    Discussed the use of AI scribe software for clinical note transcription with the patient, who gave verbal consent to proceed.  History of Present Illness Shawn Brooks is a 48 year old male with back pain and irritable bowel syndrome who presents with worsening back pain and gastrointestinal symptoms.  Visit 01/02/2024: Today he complains of flareup of lower back pain after he slipped and fell on his buttock on some stairs a few months ago. It has gotten worse recently; also hurts in shoulder blade area.  Also has been having some knee pain times a few months, with the left knee more bothersome. The pain worsens when he has to walk up or down stairs.  There are no injuries or swelling.  Muscle relaxant, Robaxin , prescribed by his ID specialist and taken for three to four days have not provided relief.  X-rays were ordered of both knees and of the lumbar spine.  He has not had these done as yet.  States that it slipped his mind.  Patient was referred to orthopedics for the knees and lower back pain.  He was also referred for physical therapy on his lower back.  Today:  He presents today again with concern for persistent lower back pain.  He has been experiencing ongoing back pain since a slip on some steps a few months ago. The pain is located across the lower back on both sides, with additional discomfort  in the left shoulder and middle of the back. It radiates to the back of his legs and is associated with intermittent numbness and tingling. He manages the pain with Aleve  (2 tablets in the mornings and 1 in the afternoon), Icy Hot, occasional massages, and a back brace, taking Aleve  twice daily. The back brace sometimes exacerbates the pain, and the discomfort worsens with lying on his back, standing, or sitting for long periods. He has a history of a broken back from a fall several years ago and another after that. He has not yet completed the x-rays that were previously ordered. He attempted physical therapy exercises at home, which were recommended by the physical therapist but  caused significant pain so he has held off on doing them.  He mentioned that he is working with a new lawyer trying to get approved for disability.  He is experiencing a flare-up of gastrointestinal symptoms, including frequent bowel movements, with up to ten episodes yesterday and three to four today. The stools are soft but not watery, with occasional blood and blood clots. He has a history of internal hemorrhoids and was diagnosed with irritable bowel syndrome with diarrhea. No fever or pus is present, but there is occasional sharp rectal pain. He has not scheduled a follow-up with his gastroenterologist since his last visit earlier this year after a hospitalization.  He had colonoscopy done earlier this year by his gastroenterologist Dr.  Pyrtle.  He mentions a recent increase in left shoulder pain, particularly under the shoulder blade, which worsened after standing for an extended period while looking for paperwork a few wks ago.  He denies any specific injury to the shoulder.  He states that the pain is across the shoulder starting from the neck.  Blood pressure noted to be elevated again today.  He is not sure what is causing it.  He had seen his ID specialist Dr. Overton since last visit with me and blood pressure on that visit  was normal.    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   Perianal abscess 06/24/2023   Acute urinary retention 06/24/2023   AKI (acute kidney injury) (HCC) 06/24/2023   Anal fissure 06/17/2023   Folate deficiency 04/08/2023   Obstructive sleep apnea 03/10/2023   Severe episode of recurrent major depressive disorder, with psychotic features (HCC) 02/24/2023   Social anxiety disorder 02/24/2023   Class 2 severe obesity due to excess calories with serious comorbidity and body mass index (BMI) of 35.0 to 35.9 in adult (HCC) 10/07/2022   Influenza vaccine refused 06/17/2020   Esophageal dysphagia 06/17/2020   Mixed hyperlipidemia 06/17/2020   Obesity, Class I, BMI 30-34.9 06/17/2020   History of farsightedness 06/17/2020   Chronic night sweats 04/10/2020   Counseled about COVID-19 virus infection 09/18/2019   Urinary frequency 12/26/2018   Medication monitoring encounter 08/08/2018   Memory problem 09/01/2017   Vitamin D  deficiency 09/01/2017   Primary osteoarthritis of left knee 05/02/2017   Prediabetes 12/22/2016   Tobacco abuse 05/31/2016   Lumbar transverse process fracture (HCC) 10/02/2015   Screening examination for sexually transmitted disease 04/04/2014   Thoracic or lumbosacral neuritis or radiculitis 01/15/2013   H/O gastrointestinal disease 01/15/2013   Lumbar radiculopathy 01/15/2013   Carcinoma in situ of anal canal 11/17/2012   Condyloma acuminatum 11/17/2012   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   AGW (anogenital warts) 11/17/2012   Hemorrhoid 09/27/2012   BRBPR (bright red blood per rectum) 07/27/2012   Prostatitis 04/12/2012   Constipation 03/09/2012   Callus 08/16/2011   Ingrown toenail 08/16/2011   Chronic low back pain 08/16/2011   Dyslipidemia 09/03/2010   Herpes simplex virus (HSV) infection 07/06/2010   Erectile  dysfunction 07/06/2010   BURSITIS, KNEE 05/06/2010   Human immunodeficiency virus (HIV) disease (HCC) 04/16/2010     Current Outpatient Medications on File Prior to Visit  Medication Sig Dispense Refill   acetaminophen  (TYLENOL  8 HOUR) 650 MG CR tablet Take 1 tablet (650 mg total) by mouth every 8 (eight) hours as needed for pain. 30 tablet 1   albuterol  (VENTOLIN  HFA) 108 (90 Base) MCG/ACT inhaler Inhale 2 puffs into the lungs every 6 (six) hours as needed for wheezing or shortness of breath. 18 g 1   AMBULATORY NON FORMULARY MEDICATION Medication Name: Diltiazem  2% gel:Lidocaine  5% - using your index finger, you should apply a small amount of medication inside the rectum up to your first knuckle/joint three times daily x 6-8 weeks. 45 g 1   aspirin  EC 81 MG tablet Take 1 tablet (81 mg total) by mouth daily. Swallow whole. 100 tablet 1   bictegravir-emtricitabine -tenofovir  AF (BIKTARVY) 50-200-25 MG TABS tablet Take 1 tablet by mouth daily. 30 tablet 11   Eluxadoline  (VIBERZI ) 100 MG TABS TAKE 1 TABLET BY MOUTH 1-2 TIMES DAILY 180 tablet 0  FLUoxetine  (PROZAC ) 40 MG capsule Take 2 capsules (80 mg total) by mouth daily. 180 capsule 0   lamoTRIgine  (LAMICTAL ) 25 MG tablet Take 1 tablet (25 mg total) by mouth daily. 30 tablet 0   Melatonin 10 MG CAPS Take 10 mg by mouth at bedtime. 90 capsule 0   nicotine  (NICODERM CQ  - DOSED IN MG/24 HOURS) 21 mg/24hr patch Place 1 patch (21 mg total) onto the skin daily. 28 patch 1   propranolol  (INDERAL ) 10 MG tablet Take 1 tablet (10 mg total) by mouth 2 (two) times daily as needed (anxiety). 60 tablet 0   rosuvastatin  (CRESTOR ) 10 MG tablet Take 1 tablet (10 mg total) by mouth daily. 90 tablet 3   solifenacin  (VESICARE ) 5 MG tablet Take 1 tablet (5 mg total) by mouth daily. 30 tablet 11   tadalafil  (CIALIS ) 5 MG tablet Take 1 tablet (5 mg total) by mouth daily. 30 tablet 11   traZODone  (DESYREL ) 100 MG tablet Take 0.5 tablets (50 mg total) by mouth at  bedtime as needed and may repeat dose one time if needed for sleep. 30 tablet 0   valACYclovir  (VALTREX ) 500 MG tablet TAKE 1 TABLET BY MOUTH TWICE DAILY 14 tablet 5   Vitamin D , Ergocalciferol , (DRISDOL ) 1.25 MG (50000 UNIT) CAPS capsule TAKE 1 CAPSULE BY MOUTH EVERY 7 DAYS 12 capsule 1   No current facility-administered medications on file prior to visit.    Allergies  Allergen Reactions   Chantix  [Varenicline ] Other (See Comments)    Bad dreams   Oxycodone  Itching    Social History   Socioeconomic History   Marital status: Divorced    Spouse name: Not on file   Number of children: 1   Years of education: Not on file   Highest education level: Some college, no degree  Occupational History   Occupation: Disabled  Tobacco Use   Smoking status: Every Day    Current packs/day: 0.50    Average packs/day: 0.5 packs/day for 34.6 years (17.3 ttl pk-yrs)    Types: Cigarettes    Start date: 77   Smokeless tobacco: Never   Tobacco comments:    not ready to quit.  Vaping Use   Vaping status: Never Used  Substance and Sexual Activity   Alcohol use: Yes    Comment: socially   Drug use: Yes    Frequency: 7.0 times per week    Types: Marijuana   Sexual activity: Not Currently    Partners: Male    Comment: declined condoms  Other Topics Concern   Not on file  Social History Narrative   Not on file   Social Drivers of Health   Financial Resource Strain: High Risk (01/02/2024)   Overall Financial Resource Strain (CARDIA)    Difficulty of Paying Living Expenses: Very hard  Food Insecurity: Food Insecurity Present (01/02/2024)   Hunger Vital Sign    Worried About Running Out of Food in the Last Year: Often true    Ran Out of Food in the Last Year: Often true  Transportation Needs: No Transportation Needs (01/02/2024)   PRAPARE - Administrator, Civil Service (Medical): No    Lack of Transportation (Non-Medical): No  Physical Activity: Inactive (01/02/2024)   Exercise  Vital Sign    Days of Exercise per Week: 0 days    Minutes of Exercise per Session: Not on file  Stress: Stress Concern Present (01/02/2024)   [employer] of Occupational Health - Occupational Stress Questionnaire  Feeling of Stress: Very much  Social Connections: Moderately Isolated (01/02/2024)   Social Connection and Isolation Panel    Frequency of Communication with Friends and Family: More than three times a week    Frequency of Social Gatherings with Friends and Family: Once a week    Attends Religious Services: Never    Database administrator or Organizations: No    Attends Engineer, structural: Not on file    Marital Status: Living with partner  Intimate Partner Violence: Not At Risk (09/07/2023)   Humiliation, Afraid, Rape, and Kick questionnaire    Fear of Current or Ex-Partner: No    Emotionally Abused: No    Physically Abused: No    Sexually Abused: No    Family History  Problem Relation Age of Onset   Drug abuse Mother    Alcohol abuse Mother    Esophageal cancer Mother 15       smoker/used ETOH   Alcohol abuse Father    Drug abuse Father    Pneumonia Father    Diabetes Father    Hypertension Sister    Crohn's disease Brother    Colon cancer Maternal Grandfather    Crohn's disease Maternal Grandmother    Cancer Maternal Grandmother        patient unsure of type   Colon polyps Neg Hx    Rectal cancer Neg Hx    Stomach cancer Neg Hx     Past Surgical History:  Procedure Laterality Date   COLONOSCOPY  2019   JMP-MAC-prep good-polyps+   ESOPHAGOGASTRODUODENOSCOPY  03/27/2012   Procedure: ESOPHAGOGASTRODUODENOSCOPY (EGD);  Surgeon: Jerrell KYM Sol, MD;  Location: [facility];  Service: Endoscopy;  Laterality: N/A;   INCISION AND DRAINAGE PERIRECTAL ABSCESS N/A 06/25/2023   Procedure: IRRIGATION AND DEBRIDEMENT PERIRECTAL ABSCESS;  Surgeon: Paola Dreama SAILOR, MD;  Location: [facility];  Service: General;  Laterality: N/A;   IRRIGATION AND  DEBRIDEMENT ABSCESS N/A 03/13/2015   Procedure: IRRIGATION AND DEBRIDEMENT ABSCESS;  Surgeon: Donnice Lunger, MD;  Location: [facility];  Service: General;  Laterality: N/A;   WISDOM TOOTH EXTRACTION      ROS: Review of Systems Negative except as stated above  PHYSICAL EXAM: BP 137/89   Pulse (!) 59   Wt 226 lb 12.8 oz (102.9 kg)   SpO2 95%   BMI 34.48 kg/m   Physical Exam   General appearance - alert, well appearing, and in no distress Mental status - normal mood, behavior, speech, dress, motor activity, and thought processes Neurological -he has decreased gross sensation more on the left lower leg than the right.  Power in both lower extremities proximally and distally 5/5 bilaterally. Musculoskeletal -gait is slow.  Patient holds his back.  He has tenderness on palpation of the lumbar spine and surrounding paraspinal muscles.  Straight leg raise was unable to be performed effectively due to pain.  He was able to raise only about 5 degrees off the table on both sides. Left shoulder: No point tenderness.  Good range of motion.  Drop arm test negative.  No tenderness on palpation of the cervical spine Rectal exam: Patient declined stating that he plans to call and follow-up with Dr. Albertus     Latest Ref Rng & Units 10/12/2023   10:07 AM 06/27/2023    5:59 AM 06/25/2023    6:04 AM  CMP  Glucose 65 - 99 mg/dL 87  98  99   BUN 7 - 25 mg/dL 9  13  10   Creatinine 0.70 - 1.30 mg/dL 8.77  9.02  9.01   Sodium 135 - 146 mmol/L 139  137  137   Potassium 3.5 - 5.3 mmol/L 4.2  3.7  3.7   Chloride 98 - 110 mmol/L 102  104  103   CO2 20 - 32 mmol/L 28  25  26    Calcium  8.6 - 10.3 mg/dL 9.8  8.2  8.6   Total Protein 6.1 - 8.1 g/dL 7.5     Total Bilirubin 0.2 - 1.2 mg/dL 0.3     AST 10 - 35 U/L 19     ALT 9 - 46 U/L 21      Lipid Panel     Component Value Date/Time   CHOL 200 (H) 04/08/2023 0914   TRIG 130 04/08/2023 0914   HDL 53 04/08/2023 0914   CHOLHDL 3.8 04/08/2023 0914    CHOLHDL 4.7 08/09/2019 0922   VLDL 28 05/12/2016 0955   LDLCALC 124 (H) 04/08/2023 0914   LDLCALC  08/09/2019 0922     Comment:     . LDL cholesterol not calculated. Triglyceride levels greater than 400 mg/dL invalidate calculated LDL results. . Reference range: <100 . Desirable range <100 mg/dL for primary prevention;   <70 mg/dL for patients with CHD or diabetic patients  with > or = 2 CHD risk factors. SABRA LDL-C is now calculated using the Martin-Hopkins  calculation, which is a validated novel method providing  better accuracy than the Friedewald equation in the  estimation of LDL-C.  Gladis APPLETHWAITE et al. SANDREA. 7986;689(80): 2061-2068  (http://education.[facility].com/faq/FAQ164)     CBC    Component Value Date/Time   WBC 7.7 10/12/2023 1007   RBC 5.06 10/12/2023 1007   HGB 15.2 10/12/2023 1007   HGB 14.6 08/30/2023 1101   HCT 45.2 10/12/2023 1007   HCT 44.6 08/30/2023 1101   PLT 224 10/12/2023 1007   PLT 245 08/30/2023 1101   MCV 89.3 10/12/2023 1007   MCV 92 08/30/2023 1101   MCH 30.0 10/12/2023 1007   MCHC 33.6 10/12/2023 1007   RDW 13.9 10/12/2023 1007   RDW 13.8 08/30/2023 1101   LYMPHSABS 2.1 06/24/2023 1211   LYMPHSABS CANCELED 03/18/2023 0000   MONOABS 1.2 (H) 06/24/2023 1211   EOSABS 0.0 06/24/2023 1211   EOSABS CANCELED 03/18/2023 0000   BASOSABS 0.0 06/24/2023 1211   BASOSABS CANCELED 03/18/2023 0000    ASSESSMENT AND PLAN: 1. Lumbar radiculopathy (Primary) Given that this has not improved with conservative measures and has been present for over 2 months, we will move forward with getting an MRI of the lumbar spine. Patient to avoid any pushing pulling or lifting. Will try him with tramadol  to take as needed in the meantime.  Advised that it can cause some drowsiness and we will use it short-term.  Also advised that it is a controlled substance.  [place]  controlled substance reporting system reviewed. - traMADol  (ULTRAM ) 50 MG tablet; Take  1 tablet (50 mg total) by mouth every 12 (twelve) hours as needed.  Dispense: 15 tablet; Refill: 0 - MR Lumbar Spine Wo Contrast; Future  2. Chronic left shoulder pain Of questionable etiology.  The left shoulder itself on exam is okay.  I wonder whether it could be coming from his neck.  We will get an x-ray of the shoulder  3. Rectal bleeding Patient plans to follow-up with his gastroenterologist on this.  4. Diarrhea, unspecified type Will check stool for C. difficile and cultures -  Cdiff NAA+O+P+Stool Culture  5. Elevated blood pressure reading Repeat blood pressure today is better but still elevated.  Use of Advil  daily could be playing a role.  DASH diet encouraged.  He has a follow-up visit with our clinical pharmacist later this month for recheck on blood pressure  Assessment and Plan Assessment & Plan Chronic Back Pain Chronic back pain with radiation to legs, intermittent numbness, and tingling. Degenerative disc disease and pinched nerve noted. MRI ordered to evaluate further. Initiated low-dose tramadol  due to adverse reactions to stronger narcotics. - Order MRI of the lumbar spine. - Encourage completion of x-rays at [facility]. - Continue physical therapy exercises as tolerated. - Keep appointment with orthopedics later this month. - Consider referral to neurosurgeon if MRI indicates surgical intervention. - Initiate low-dose tramadol  twice a day for pain management.  Left Shoulder Pain Pain under left shoulder blade, exacerbated by standing. X-ray ordered for evaluation. - Order x-ray of the left shoulder.  Irritable Bowel Syndrome with Diarrhea IBS with diarrhea, increased bowel movement frequency, and intermittent blood. Imodium recommended. Stool sample to check for infection. - Use Imodium as needed for diarrhea. - Collect stool sample to check for infection. - Follow up with gastroenterologist, Dr. Stoney, for further management.  Hypertension Elevated  blood pressure possibly due to pain or NSAID sensitivity. Monitoring blood pressure and considering alternative pain management.     There are no diagnoses linked to this encounter.   Patient was given the opportunity to ask questions.  Patient verbalized understanding of the plan and was able to repeat key elements of the plan.   This documentation was completed using Paediatric nurse.  Any transcriptional errors are unintentional.  Orders Placed This Encounter  Procedures   Cdiff NAA+O+P+Stool Culture   MR Lumbar Spine Wo Contrast   DG Shoulder Left     Requested Prescriptions   Signed Prescriptions Disp Refills   traMADol  (ULTRAM ) 50 MG tablet 15 tablet 0    Sig: Take 1 tablet (50 mg total) by mouth every 12 (twelve) hours as needed.    No follow-ups on file.  Barnie Louder, MD, FACP

## 2024-01-16 NOTE — Telephone Encounter (Signed)
 FYI Only or Action Required?: FYI only for provider.  Patient was last seen in primary care on 01/02/2024 by Vicci Barnie NOVAK, MD.  Called Nurse Triage reporting Back Pain and Shoulder Pain.  Symptoms began several months ago.  Interventions attempted: OTC medications: Icy Hot, Aleve  and Ice/heat application.  Symptoms are: left shoulder pain, lower back pain, bilateral leg numbness gradually worsening.  Triage Disposition: See Physician Within 24 Hours  Patient/caregiver understands and will follow disposition?: Yes           Copied from CRM [id number]. Topic: Clinical - Red Word Triage >> Jan 16, 2024 11:38 AM Montie POUR wrote: Red Word that prompted transfer to Nurse Triage:  He is having pain in lower back and right shoulder. Pain level is a 7-8 Reason for Disposition  Numbness in a leg or foot (i.e., loss of sensation)  Answer Assessment - Initial Assessment Questions 1. ONSET: When did the pain begin? (e.g., minutes, hours, days)     Chronic x years but worsening x 3 months.  2. LOCATION: Where does it hurt? (upper, mid or lower back)     Lower.  3. SEVERITY: How bad is the pain?  (e.g., Scale 1-10; mild, moderate, or severe)     7-8/10.  4. PATTERN: Is the pain constant? (e.g., yes, no; constant, intermittent)      Constant.  5. RADIATION: Does the pain shoot into your legs or somewhere else?     No.  6. CAUSE:  What do you think is causing the back pain?      He states he has had falls in the past and has broken his back before.  7. BACK OVERUSE:  Any recent lifting of heavy objects, strenuous work or exercise?     He states he has been having to stand for longer periods of time.  8. MEDICINES: What have you taken so far for the pain? (e.g., nothing, acetaminophen , NSAIDS)     Heating pad, icy hot, Aleve . He states it relieves the pain a little bit.  9. NEUROLOGIC SYMPTOMS: Do you have any weakness, numbness, or problems with bowel/bladder  control?     Numbness down back of bilateral legs. Denies weakness or loss of bowel or bladder control.  10. OTHER SYMPTOMS: Do you have any other symptoms? (e.g., fever, abdomen pain, burning with urination, blood in urine)       Left shoulder pain radiates to base of neck x several weeks. Patient denies chest pain, SOB, jaw pain.  11. PREGNANCY: Is there any chance you are pregnant? When was your last menstrual period?       N/A.  Protocols used: Back Pain-A-AH

## 2024-01-16 NOTE — Telephone Encounter (Signed)
 noted

## 2024-01-17 ENCOUNTER — Encounter: Payer: Self-pay | Admitting: Internal Medicine

## 2024-01-17 ENCOUNTER — Ambulatory Visit
Admission: RE | Admit: 2024-01-17 | Discharge: 2024-01-17 | Disposition: A | Discharge status: Home / Self Care | Payer: MEDICAID | Source: Ambulatory Visit | Attending: Internal Medicine | Admitting: Internal Medicine

## 2024-01-17 DIAGNOSIS — G8929 Other chronic pain: Secondary | ICD-10-CM

## 2024-01-17 DIAGNOSIS — M545 Low back pain, unspecified: Secondary | ICD-10-CM

## 2024-01-17 DIAGNOSIS — M25561 Pain in right knee: Secondary | ICD-10-CM

## 2024-01-18 ENCOUNTER — Encounter: Payer: Self-pay | Admitting: Internal Medicine

## 2024-01-18 ENCOUNTER — Other Ambulatory Visit: Payer: Self-pay | Admitting: Internal Medicine

## 2024-01-18 DIAGNOSIS — M5416 Radiculopathy, lumbar region: Secondary | ICD-10-CM

## 2024-01-19 ENCOUNTER — Encounter: Payer: Self-pay | Admitting: Internal Medicine

## 2024-01-19 ENCOUNTER — Other Ambulatory Visit: Payer: Self-pay | Admitting: Internal Medicine

## 2024-01-19 ENCOUNTER — Telehealth: Payer: Self-pay | Admitting: Internal Medicine

## 2024-01-19 MED ORDER — FIDAXOMICIN 200 MG PO TABS: 200.0000 mg | ORAL_TABLET | Freq: Two times a day (BID) | ORAL | 0 refills | Status: DC

## 2024-01-19 MED ORDER — VANCOMYCIN HCL 125 MG PO CAPS: 125.0000 mg | ORAL_CAPSULE | Freq: Three times a day (TID) | ORAL | 0 refills | Status: AC

## 2024-01-19 NOTE — Telephone Encounter (Signed)
 Doris Volunteer called pt 7/23 to confirm appt

## 2024-01-20 ENCOUNTER — Encounter: Payer: Self-pay | Admitting: Pharmacist

## 2024-01-20 ENCOUNTER — Ambulatory Visit: Payer: MEDICAID | Attending: Internal Medicine | Admitting: Pharmacist

## 2024-01-20 VITALS — BP 116/75 | HR 69

## 2024-01-20 DIAGNOSIS — R030 Elevated blood-pressure reading, without diagnosis of hypertension: Secondary | ICD-10-CM | POA: Diagnosis not present

## 2024-01-20 NOTE — Progress Notes (Signed)
 S:     No chief complaint on file.  48 y.o. male who presents for hypertension evaluation, education, and management.   Patient was referred and last seen by Primary Care Provider, Dr. Vicci, on 01/16/2024. BP was 137/89 mmHg at that visit. He was asked to return and see me for a BP check.   PMH is significant for HIV, DJD,  hyperlipidemia, anxiety, bipolar 1  (followed by psychiatry ), tobacco dependence, vit D deficiency, numbness in hands (neg EMG 2019), chronic bilateral lower back pain , rectal fissure, internal hemorrhoids, PreDM.    Today, patient arrives in good spirits and presents without assistance. Denies dizziness, headache, blurred vision, swelling.   Patient reports hypertension has never been diagnosed. His BP with Dr. Vicci was elevated without the dx of HTN.   Family/Social history:  Fhx: substance abuse, DM, HTN, Crohn's disease Tobacco: current 0.5 PPD smoker since 1991 Alcohol: none reported   Medication adherence: does not currently take antihypertensive medications.  Current antihypertensives include: none  Antihypertensives tried in the past include: none  Reported home blood pressure readings: none. His partner has a BP cuff and he plans to begin checking.   Patient reported dietary habits:  -Compliant with sodium restriction -Denies drinking excessive amounts of caffeine   Patient-reported exercise habits: none due to chronic back pain  O:  Vitals:   01/20/24 1613  BP: 116/75  Pulse: 69   Last 3 Office BP readings: BP Readings from Last 3 Encounters:  01/16/24 137/89  01/05/24 122/80  01/02/24 131/86   BMET    Component Value Date/Time   NA 139 10/12/2023 1007   NA 140 03/18/2023 0000   K 4.2 10/12/2023 1007   CL 102 10/12/2023 1007   CO2 28 10/12/2023 1007   GLUCOSE 87 10/12/2023 1007   BUN 9 10/12/2023 1007   BUN 11 03/18/2023 0000   CREATININE 1.22 10/12/2023 1007   CALCIUM  9.8 10/12/2023 1007   GFRNONAA >60 06/27/2023 0559    GFRNONAA 64 07/24/2020 0914   GFRAA 74 07/24/2020 0914   Renal function: CrCl cannot be calculated (Patient's most recent lab result is older than the maximum 21 days allowed.).  Clinical ASCVD: Yes  The 10-year ASCVD risk score (Arnett DK, et al., 2019) is: 16.3%   Values used to calculate the score:     Age: 4 years     Clincally relevant sex: Male     Is Non-Hispanic African American: Yes     Diabetic: No     Tobacco smoker: Yes     Systolic Blood Pressure: 137 mmHg     Is BP treated: Yes     HDL Cholesterol: 53 mg/dL     Total Cholesterol: 200 mg/dL  Patient is participating in a Managed Medicaid Plan: No   A/P: Hypertension undiagnosed. Currently, his BP his normotensive. BP goal < 120/80 mmHg. Encouraged checking BP at home ~1-2x weekly. Will not start BP medication at this time.  -Counseled on lifestyle modifications for blood pressure control including reduced dietary sodium, increased exercise, adequate sleep. -Encouraged patient to check BP at home and bring log of readings to next visit. Counseled on proper use of home BP cuff.   Results reviewed and written information provided.    Written patient instructions provided. Patient verbalized understanding of treatment plan.  Total time in face to face counseling 20 minutes.    Follow-up:  Pharmacist prn.  Herlene Fleeta Morris, PharmD, BCACP, CPP Clinical Pharmacist [facility] [phone]

## 2024-01-21 ENCOUNTER — Other Ambulatory Visit: Payer: MEDICAID

## 2024-01-21 LAB — CDIFF NAA+O+P+STOOL CULTURE
E coli, Shiga toxin Assay: NEGATIVE
Toxigenic C. Difficile by PCR: POSITIVE — AB

## 2024-01-22 ENCOUNTER — Ambulatory Visit: Payer: Self-pay | Admitting: Internal Medicine

## 2024-01-23 ENCOUNTER — Other Ambulatory Visit: Payer: MEDICAID

## 2024-01-23 ENCOUNTER — Telehealth: Payer: Self-pay | Admitting: *Deleted

## 2024-01-23 NOTE — Patient Instructions (Signed)
 Visit Information  Thank you for taking time to visit with me today. Please don't hesitate to contact me if I can be of assistance to you before our next scheduled appointment.  Our next appointment is by telephone on 01/24/2024  Please call the care guide team at [phone] if you need to cancel or reschedule your appointment.      Please call the Suicide and Crisis Lifeline: 988 call the [place]  National Suicide Prevention Lifeline: 773 501 5603 or TTY: 915-443-5099 TTY [phone]) to talk to a trained counselor call 1-800-273-TALK (toll free, 24 hour hotline) go to [facility] [address], [place] [phone]) call 911 if you are experiencing a Mental Health or Behavioral Health Crisis or need someone to talk to.  Patient verbalizes understanding of instructions and care plan provided today and agrees to view in MyChart. Active MyChart status and patient understanding of how to access instructions and care plan via MyChart confirmed with patient.     Orlean Fey, BSW [place]  [facility] Social Worker, [facility] [phone]

## 2024-01-23 NOTE — Telephone Encounter (Signed)
 Open in error

## 2024-01-23 NOTE — Progress Notes (Signed)
 Complex Care Management Note  Care Guide Note 01/23/2024 Name: Shawn Brooks MRN: [id number] DOB: 05-23-1976  Shawn Brooks is a 47 y.o. year old male who sees Vicci Barnie NOVAK, MD for primary care. I reached out to Kwaku CHRISTELLA Lowers by phone today to offer complex care management services.  Mr. Allende was given information about Complex Care Management services today including:   The Complex Care Management services include support from the care team which includes your Nurse Care Manager, Clinical Social Worker, or Pharmacist.  The Complex Care Management team is here to help remove barriers to the health concerns and goals most important to you. Complex Care Management services are voluntary, and the patient may decline or stop services at any time by request to their care team member.   Complex Care Management Consent Status: Patient agreed to services and verbal consent obtained.   Follow up plan:  Telephone appointment with complex care management team member scheduled for:  01/23/24  Encounter Outcome:  Patient Scheduled

## 2024-01-23 NOTE — Patient Outreach (Signed)
 Complex Care Management   Visit Note  01/23/2024  Name:  Shawn Brooks MRN: [id number] DOB: [birth year]  Situation: Referral received for Complex Care Management related to SDOH Barriers:  Housing   Financial Resource Strain I obtained verbal consent from Patient.  Visit completed with patient  on the phone  Background:   Past Medical History:  Diagnosis Date   Anal condyloma    Anxiety    Blood dyscrasia    HIV   Chronic back pain    Constipation    takes miralax  daily   Depression    on meds   DJD (degenerative joint disease)    Gastric ulcer    GERD (gastroesophageal reflux disease)    Headache(784.0)    Hiatal hernia    HIV infection (HCC)    on meds   Hyperplastic colon polyp    Hypertension    on meds   Internal hemorrhoids    Sickle cell anemia (HCC)     Assessment:  BSW reached out to patient today in response to an emergent referral. BSW conducted an assessment for SDOH barriers. Housing instability and financial strain were identified as the primary concerns. Patient reported experiencing multiple falls, resulting in ongoing pain and an inability to work. Due to this, patient has fallen behind on rent payments and currently has an eviction pending for 01/26/24. BSW provided housing and financial assistance resources and connected patient with [employer] for further support. BSW will follow up with patient on 01/24/2024  SDOH Interventions Today    Flowsheet Row Most Recent Value  SDOH Interventions   Housing Interventions Community Resources Provided  Transportation Interventions Community Resources Provided  Financial Strain Interventions Community Resources Provided          Recommendation:   No recommendations at this time.  Follow Up Plan:   Telephone follow-up 01/24/2024  Orlean Fey, BSW [place]  [facility] Social Worker, [facility] [phone]

## 2024-01-24 ENCOUNTER — Ambulatory Visit: Payer: MEDICAID | Admitting: Orthopaedic Surgery

## 2024-01-24 ENCOUNTER — Other Ambulatory Visit: Payer: MEDICAID

## 2024-01-24 DIAGNOSIS — M545 Low back pain, unspecified: Secondary | ICD-10-CM | POA: Diagnosis not present

## 2024-01-24 DIAGNOSIS — G8929 Other chronic pain: Secondary | ICD-10-CM | POA: Diagnosis not present

## 2024-01-24 DIAGNOSIS — M25561 Pain in right knee: Secondary | ICD-10-CM | POA: Diagnosis not present

## 2024-01-24 DIAGNOSIS — M25562 Pain in left knee: Secondary | ICD-10-CM

## 2024-01-24 NOTE — Progress Notes (Signed)
 Office Visit Note   Patient: Shawn Brooks           Date of Birth: 1975/11/06           MRN: [id number] Visit Date: 01/24/2024              Requested by: Vicci Barnie NOVAK, MD [address] [place],  [place] [address] PCP: Vicci Barnie NOVAK, MD   Assessment & Plan: Visit Diagnoses:  1. Chronic pain of both knees   2. Chronic midline low back pain without sciatica     Plan: History of Present Illness Shawn Brooks is a 48 year old male with chronic low back pain who presents with bilateral knee pain, worse on the left.  He experiences intermittent bilateral knee pain, more severe on the left, exacerbated by activities such as getting up, exiting a car, and descending stairs. He feels a sensation of instability in the knees and experiences shaking when descending stairs. He has not received injections or physical therapy for his knees.  He has chronic low back pain with a history of receiving injections, which provided some relief. He experiences tingling and burning sensations radiating down his legs. He has difficulty engaging in exercise due to exacerbation of back pain, limiting his ability to walk or stand for more than ten minutes without severe pain. Home exercises were intolerable due to severe pain when lying flat. He previously found injections more effective than oral medications for managing back pain.  He is currently not working and is in the process of filing for disability primarily due to his back pain.  Results RADIOLOGY Knee X-ray: No significant arthritic changes, no fractures, no dislocations.  Assessment and Plan Bilateral patellofemoral chondromalacia Bilateral knee pain, left more symptomatic. X-rays show no significant arthritis, fractures, or dislocations. Symptoms suggest early cartilage thinning behind the kneecap, consistent with patellofemoral chondromalacia. Not at surgical stage. - Refer to physical therapy to strengthen knee muscles  and stabilize joints. - Advise wearing knee braces during activities. - Discuss weight management if applicable.  Chronic low back pain Chronic low back pain with previous back injections and injuries. Reports tingling and burning sensations down the legs. Unable to tolerate physical therapy. Pain management with medications challenging due to addiction concerns. - Refer to pain clinic for evaluation and management, including potential MRI, injections, or pain medications.  Follow-Up Instructions: No follow-ups on file.   Orders:  No orders of the defined types were placed in this encounter.  No orders of the defined types were placed in this encounter.     Procedures: No procedures performed   Clinical Data: No additional findings.   Subjective: Chief Complaint  Patient presents with   Left Knee - Pain   Lower Back - Pain    HPI  Review of Systems  Constitutional: Negative.   HENT: Negative.    Eyes: Negative.   Respiratory: Negative.    Cardiovascular: Negative.   Gastrointestinal: Negative.   Endocrine: Negative.   Genitourinary: Negative.   Skin: Negative.   Allergic/Immunologic: Negative.   Neurological: Negative.   Hematological: Negative.   Psychiatric/Behavioral: Negative.    All other systems reviewed and are negative.    Objective: Vital Signs: There were no vitals taken for this visit.  Physical Exam Vitals and nursing note reviewed.  Constitutional:      Appearance: He is well-developed.  HENT:     Head: Normocephalic and atraumatic.  Eyes:     Pupils:  Pupils are equal, round, and reactive to light.  Pulmonary:     Effort: Pulmonary effort is normal.  Abdominal:     Palpations: Abdomen is soft.  Musculoskeletal:        General: Normal range of motion.     Cervical back: Neck supple.  Skin:    General: Skin is warm.  Neurological:     Mental Status: He is alert and oriented to person, place, and time.  Psychiatric:        Behavior:  Behavior normal.        Thought Content: Thought content normal.        Judgment: Judgment normal.     Ortho Exam  Specialty Comments:  No specialty comments available.  Imaging: No results found.   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   Perianal abscess 06/24/2023   Acute urinary retention 06/24/2023   AKI (acute kidney injury) (HCC) 06/24/2023   Anal fissure 06/17/2023   Folate deficiency 04/08/2023   Obstructive sleep apnea 03/10/2023   Severe episode of recurrent major depressive disorder, with psychotic features (HCC) 02/24/2023   Social anxiety disorder 02/24/2023   Class 2 severe obesity due to excess calories with serious comorbidity and body mass index (BMI) of 35.0 to 35.9 in adult [facility]) 10/07/2022   Influenza vaccine refused 06/17/2020   Esophageal dysphagia 06/17/2020   Mixed hyperlipidemia 06/17/2020   Obesity, Class I, BMI 30-34.9 06/17/2020   History of farsightedness 06/17/2020   Chronic night sweats 04/10/2020   Counseled about COVID-19 virus infection 09/18/2019   Urinary frequency 12/26/2018   Medication monitoring encounter 08/08/2018   Memory problem 09/01/2017   Vitamin D  deficiency 09/01/2017   Primary osteoarthritis of left knee 05/02/2017   Prediabetes 12/22/2016   Tobacco abuse 05/31/2016   Lumbar transverse process fracture (HCC) 10/02/2015   Screening examination for sexually transmitted disease 04/04/2014   Thoracic or lumbosacral neuritis or radiculitis 01/15/2013   H/O gastrointestinal disease 01/15/2013   Lumbar radiculopathy 01/15/2013   Carcinoma in situ of anal canal 11/17/2012   Condyloma acuminatum 11/17/2012   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   AGW (anogenital warts) 11/17/2012   Hemorrhoid 09/27/2012   BRBPR (bright red blood per  rectum) 07/27/2012   Prostatitis 04/12/2012   Constipation 03/09/2012   Callus 08/16/2011   Ingrown toenail 08/16/2011   Chronic low back pain 08/16/2011   Dyslipidemia 09/03/2010   Herpes simplex virus (HSV) infection 07/06/2010   Erectile dysfunction 07/06/2010   BURSITIS, KNEE 05/06/2010   Human immunodeficiency virus (HIV) disease (HCC) 04/16/2010   Past Medical History:  Diagnosis Date   Anal condyloma    Anxiety    Blood dyscrasia    HIV   Chronic back pain    Constipation    takes miralax  daily   Depression    on meds   DJD (degenerative joint disease)    Gastric ulcer    GERD (gastroesophageal reflux disease)    Headache(784.0)    Hiatal hernia    HIV infection (HCC)    on meds   Hyperplastic colon polyp    Hypertension    on meds   Internal hemorrhoids    Sickle cell anemia (HCC)     Family History  Problem Relation Age of Onset   Drug abuse Mother    Alcohol  abuse Mother    Esophageal cancer Mother 49       smoker/used ETOH   Alcohol abuse Father    Drug abuse Father    Pneumonia Father    Diabetes Father    Hypertension Sister    Crohn's disease Brother    Colon cancer Maternal Grandfather    Crohn's disease Maternal Grandmother    Cancer Maternal Grandmother        patient unsure of type   Colon polyps Neg Hx    Rectal cancer Neg Hx    Stomach cancer Neg Hx     Past Surgical History:  Procedure Laterality Date   COLONOSCOPY  2019   JMP-MAC-prep good-polyps+   ESOPHAGOGASTRODUODENOSCOPY  03/27/2012   Procedure: ESOPHAGOGASTRODUODENOSCOPY (EGD);  Surgeon: Jerrell KYM Sol, MD;  Location: [facility];  Service: Endoscopy;  Laterality: N/A;   INCISION AND DRAINAGE PERIRECTAL ABSCESS N/A 06/25/2023   Procedure: IRRIGATION AND DEBRIDEMENT PERIRECTAL ABSCESS;  Surgeon: Paola Dreama SAILOR, MD;  Location: [facility];  Service: General;  Laterality: N/A;   IRRIGATION AND DEBRIDEMENT ABSCESS N/A 03/13/2015   Procedure: IRRIGATION AND DEBRIDEMENT ABSCESS;   Surgeon: Donnice Lunger, MD;  Location: [facility];  Service: General;  Laterality: N/A;   WISDOM TOOTH EXTRACTION     Social History   Occupational History   Occupation: Disabled  Tobacco Use   Smoking status: Every Day    Current packs/day: 0.50    Average packs/day: 0.5 packs/day for 34.6 years (17.3 ttl pk-yrs)    Types: Cigarettes    Start date: 32   Smokeless tobacco: Never   Tobacco comments:    not ready to quit.  Vaping Use   Vaping status: Never Used  Substance and Sexual Activity   Alcohol use: Yes    Comment: socially   Drug use: Yes    Frequency: 7.0 times per week    Types: Marijuana   Sexual activity: Not Currently    Partners: Male    Comment: declined condoms

## 2024-01-24 NOTE — Patient Outreach (Addendum)
 BSW contacted Mr. Garcon to follow up on our conversation from 01/23/2024 and to confirm whether he had received the resources that were sent. During the call, BSW and Mr. Skillin reviewed the resources together and discussed immediate and long-term housing options in light of his pending eviction in the coming days.  BSW assisted Mr. Genova in exploring affordable housing options, support for paying off his phone bill, and reviewed available services through [facility] that may assist with housing and related needs. We also discussed the option of applying for Section 8 housing in [place], [place].  BSW will follow up with Mr. Ollis on 02/02/2024 to reassess needs and provide continued support. BSW will continue to monitor the situation and remain available to assist as needed.  Orlean Fey, BSW [place]  [facility] Social Worker, [facility] [phone]

## 2024-01-24 NOTE — Patient Instructions (Signed)
 Visit Information  Thank you for taking time to visit with me today. Please don't hesitate to contact me if I can be of assistance to you before our next scheduled appointment.  Your next care management appointment is by telephone on 02/02/2024 at 2pm  Telephone follow-up in 1 week  Please call the care guide team at [phone] if you need to cancel, schedule, or reschedule an appointment.   Please call the Suicide and Crisis Lifeline: 988 call the [place]  National Suicide Prevention Lifeline: 650 602 1788 or TTY: 737-715-8949 TTY 504-321-4800) to talk to a trained counselor call 1-800-273-TALK (toll free, 24 hour hotline) go to [facility] [address] [place] [phone]) call 911 if you are experiencing a Mental Health or Behavioral Health Crisis or need someone to talk to.  Orlean Fey, BSW [place]  [facility] Social Worker, [facility] [phone]

## 2024-01-26 ENCOUNTER — Ambulatory Visit (INDEPENDENT_AMBULATORY_CARE_PROVIDER_SITE_OTHER): Payer: MEDICAID

## 2024-01-26 DIAGNOSIS — G4752 REM sleep behavior disorder: Secondary | ICD-10-CM

## 2024-01-26 DIAGNOSIS — F3341 Major depressive disorder, recurrent, in partial remission: Secondary | ICD-10-CM | POA: Diagnosis not present

## 2024-01-26 MED ORDER — TRAZODONE HCL 150 MG PO TABS
150.0000 mg | ORAL_TABLET | Freq: Every day | ORAL | 1 refills | Status: DC
Start: 2024-01-26 — End: 2024-03-08

## 2024-01-26 MED ORDER — LAMOTRIGINE 25 MG PO TABS: 25.0000 mg | ORAL_TABLET | Freq: Every day | ORAL | 0 refills | Status: DC

## 2024-01-26 MED ORDER — MELATONIN 10 MG PO CAPS
10.0000 mg | ORAL_CAPSULE | Freq: Every day | ORAL | 0 refills | Status: DC
Start: 2024-01-26 — End: 2024-04-04

## 2024-01-26 MED ORDER — PROPRANOLOL HCL 10 MG PO TABS
10.0000 mg | ORAL_TABLET | Freq: Two times a day (BID) | ORAL | 0 refills | Status: DC | PRN
Start: 2024-01-26 — End: 2024-03-08

## 2024-01-26 MED ORDER — FLUOXETINE HCL 40 MG PO CAPS: 80.0000 mg | ORAL_CAPSULE | Freq: Every day | ORAL | 0 refills | Status: DC

## 2024-01-26 NOTE — Addendum Note (Signed)
 Addended by: Karinne Schmader on: 01/26/2024 11:48 AM   Modules accepted: Orders

## 2024-01-27 ENCOUNTER — Other Ambulatory Visit: Payer: Self-pay

## 2024-01-27 ENCOUNTER — Ambulatory Visit: Payer: MEDICAID | Admitting: Pharmacist

## 2024-01-27 ENCOUNTER — Ambulatory Visit: Payer: MEDICAID | Admitting: Physical Therapy

## 2024-01-27 ENCOUNTER — Ambulatory Visit: Payer: MEDICAID

## 2024-01-27 DIAGNOSIS — Z23 Encounter for immunization: Secondary | ICD-10-CM

## 2024-02-02 ENCOUNTER — Other Ambulatory Visit: Payer: MEDICAID

## 2024-02-02 NOTE — Patient Instructions (Signed)

## 2024-02-02 NOTE — Patient Outreach (Signed)
 BSW contacted Mr. Shawn Brooks for a follow-up call to confirm receipt of previously provided resources. During the call, BSW and Mr. Shawn Brooks also discussed his recent court date regarding a potential eviction. Mr. Shawn Brooks shared that he was able to secure the necessary funds to pay his rent with the assistance of family members, and the eviction was avoided but was informed of other unexpected cost from landlord. BSW also followed up on his partner's job search efforts. During the call, Mr. Shawn Brooks partner received confirmation that he had been offered a job, which has been a big stressor for both patient and partner. BSW encouraged Mr. Shawn Brooks to reach out if additional resources or support are needed in the future. Case will be closed at this time.  Orlean Fey, BSW [place]  [facility] Social Worker, [facility] [phone]

## 2024-02-14 ENCOUNTER — Encounter: Payer: Self-pay | Admitting: Psychology

## 2024-02-15 ENCOUNTER — Ambulatory Visit (INDEPENDENT_AMBULATORY_CARE_PROVIDER_SITE_OTHER): Payer: MEDICAID | Admitting: Urology

## 2024-02-15 ENCOUNTER — Encounter: Payer: Self-pay | Admitting: Urology

## 2024-02-15 ENCOUNTER — Other Ambulatory Visit: Payer: Self-pay

## 2024-02-15 ENCOUNTER — Encounter: Payer: Self-pay | Admitting: Orthopaedic Surgery

## 2024-02-15 VITALS — BP 108/72 | HR 62 | Ht 69.0 in | Wt 212.0 lb

## 2024-02-15 DIAGNOSIS — R351 Nocturia: Secondary | ICD-10-CM

## 2024-02-15 DIAGNOSIS — G8929 Other chronic pain: Secondary | ICD-10-CM

## 2024-02-15 DIAGNOSIS — N529 Male erectile dysfunction, unspecified: Secondary | ICD-10-CM

## 2024-02-15 DIAGNOSIS — R350 Frequency of micturition: Secondary | ICD-10-CM | POA: Diagnosis not present

## 2024-02-15 DIAGNOSIS — N401 Enlarged prostate with lower urinary tract symptoms: Secondary | ICD-10-CM | POA: Insufficient documentation

## 2024-02-15 LAB — URINALYSIS, ROUTINE W REFLEX MICROSCOPIC
Bilirubin, UA: NEGATIVE
Glucose, UA: NEGATIVE
Ketones, UA: NEGATIVE
Leukocytes,UA: NEGATIVE
Nitrite, UA: NEGATIVE
Protein,UA: NEGATIVE
RBC, UA: NEGATIVE
Specific Gravity, UA: 1.025 (ref 1.005–1.030)
Urobilinogen, Ur: 0.2 mg/dL (ref 0.2–1.0)
pH, UA: 5.5 (ref 5.0–7.5)

## 2024-02-15 MED ORDER — TADALAFIL 20 MG PO TABS: 20.0000 mg | ORAL_TABLET | Freq: Every day | ORAL | 11 refills | Status: AC | PRN

## 2024-02-15 NOTE — Telephone Encounter (Signed)
 Sure we can refer him somewhere else is he doesn't want [facility]

## 2024-02-15 NOTE — Progress Notes (Signed)
 Assessment: 1. BPH with lower urinary tract symptoms without urinary obstruction   2. Organic impotence     Plan: Will monitor his LUTS at this time. Continue tadalafil  20 mg as needed.  Prescription sent. Return to office in 6 months  Chief Complaint:  Chief Complaint  Patient presents with   Benign Prostatic Hypertrophy    History of Present Illness:  Shawn Brooks is a 48 y.o. male who is seen for continued evaluation of lower urinary tract symptoms and erectile dysfunction. He was initially seen in April 2025. He has a history of erectile dysfunction with difficulty achieving and maintaining his erections.  He has previously used Cialis  with some benefit.  He reports that he has not had sex in 2 years following an episode of symptoms associated with his orgasm.  Since that time, he has been masturbating.  He reports a decrease in the amount of ejaculation.  No pain with erection or ejaculation.  No curvature with erections.  He does report occasional a.m. erections.  No decrease in his libido.  He reported lower urinary tract symptoms including frequency, voiding every 60-90 minutes, urgency, nocturia x 3, and straining to void.  No dysuria or gross hematuria. IPSS = 25/5. PVR = 0 mL. PSA 4/25:  1.5 He was given a trial of tadalafil  5 mg daily for management of his ED and BPH with LUTS in April 2025.  At his visit in June 2025, he reported that he initially noted some improvement in his urinary symptoms with the daily tadalafil .  However, he did not feel like the improvement continued.  He continued with frequency, voiding every 1-2 hours during the day, nocturia x 2, and urgency.  No dysuria or gross hematuria. IPSS = 25/4. PVR = 24 mL He was given a trial of solifenacin  5 mg nightly for his ongoing lower urinary tract symptoms.  He returns today for follow-up. He did not take the solifenacin  prescribed at his last visit.  He actually reports that his lower urinary tract  symptoms have improved.  He is having intermittent nocturia 1-3 times per night.  His frequency has improved.  He is not having any significant urgency.  No dysuria or gross hematuria. IPSS = 10/4. He has been using tadalafil  20 mg for erectile dysfunction with good results.  Portions of the above documentation were copied from a prior visit for review purposes only.   Past Medical History:  Past Medical History:  Diagnosis Date   Anal condyloma    Anxiety    Blood dyscrasia    HIV   Chronic back pain    Constipation    takes miralax  daily   Depression    on meds   DJD (degenerative joint disease)    Gastric ulcer    GERD (gastroesophageal reflux disease)    Headache(784.0)    Hiatal hernia    HIV infection (HCC)    on meds   Hyperplastic colon polyp    Hypertension    on meds   Internal hemorrhoids    Sickle cell anemia (HCC)     Past Surgical History:  Past Surgical History:  Procedure Laterality Date   COLONOSCOPY  2019   JMP-MAC-prep good-polyps+   ESOPHAGOGASTRODUODENOSCOPY  03/27/2012   Procedure: ESOPHAGOGASTRODUODENOSCOPY (EGD);  Surgeon: Jerrell KYM Sol, MD;  Location: [facility];  Service: Endoscopy;  Laterality: N/A;   INCISION AND DRAINAGE PERIRECTAL ABSCESS N/A 06/25/2023   Procedure: IRRIGATION AND DEBRIDEMENT PERIRECTAL ABSCESS;  Surgeon: Paola Bolk  N, MD;  Location[facility];  Service: General;  Laterality: N/A;   IRRIGATION AND DEBRIDEMENT ABSCESS N/A 03/13/2015   Procedure: IRRIGATION AND DEBRIDEMENT ABSCESS;  Surgeon: Donnice Lunger, MD;  [facility];  Service: General;  Laterality: N/A;   WISDOM TOOTH EXTRACTION      Allergies:  Allergies  Allergen Reactions   Chantix  [Varenicline ] Other (See Comments)    Bad dreams   Oxycodone  Itching    Family History:  Family History  Problem Relation Age of Onset   Drug abuse Mother    Alcohol abuse Mother    Esophageal cancer Mother 83       smoker/used ETOH   Alcohol abuse Father     Drug abuse Father    Pneumonia Father    Diabetes Father    Hypertension Sister    Crohn's disease Brother    Colon cancer Maternal Grandfather    Crohn's disease Maternal Grandmother    Cancer Maternal Grandmother        patient unsure of type   Colon polyps Neg Hx    Rectal cancer Neg Hx    Stomach cancer Neg Hx     Social History:  Social History   Tobacco Use   Smoking status: Every Day    Current packs/day: 0.50    Average packs/day: 0.5 packs/day for 34.6 years (17.3 ttl pk-yrs)    Types: Cigarettes    Start date: 53   Smokeless tobacco: Never   Tobacco comments:    not ready to quit.  Vaping Use   Vaping status: Never Used  Substance Use Topics   Alcohol use: Yes    Comment: socially   Drug use: Yes    Frequency: 7.0 times per week    Types: Marijuana    ROS: Constitutional:  Negative for fever, chills, weight loss CV: Negative for chest pain, previous MI, hypertension Respiratory:  Negative for shortness of breath, wheezing, sleep apnea, frequent cough GI:  Negative for nausea, vomiting, bloody stool, GERD  Physical exam: BP 108/72   Pulse 62   Ht 5' 9 (1.753 m)   Wt 212 lb (96.2 kg)   BMI 31.31 kg/m  GENERAL APPEARANCE:  Well appearing, well developed, well nourished, NAD HEENT:  Atraumatic, normocephalic, oropharynx clear NECK:  Supple without lymphadenopathy or thyromegaly ABDOMEN:  Soft, non-tender, no masses EXTREMITIES:  Moves all extremities well, without clubbing, cyanosis, or edema NEUROLOGIC:  Alert and oriented x 3, normal gait, CN II-XII grossly intact MENTAL STATUS:  appropriate BACK:  Non-tender to palpation, No CVAT SKIN:  Warm, dry, and intact  Results: U/A: Negative

## 2024-02-21 ENCOUNTER — Telehealth: Payer: Self-pay | Admitting: Neurology

## 2024-02-21 NOTE — Telephone Encounter (Signed)
 The MRI was denied: otes from your doctor that say you had memory tests (low score on cognitive testing) that  showed memory problems. We also need recent blood test results (complete blood count,  electrolytes, liver function, vitamin B12 and thyroid function test) that do not explain your  memory problems.

## 2024-02-22 LAB — LIPID PANEL

## 2024-02-23 LAB — LIPID PANEL
Cholesterol, Total: 250 mg/dL — AB (ref 100–199)
HDL: 63 mg/dL (ref >39)
LDL CALC COMMENT:: 4 ratio (ref 0.0–5.0)
LDL Chol Calc (NIH): 164 mg/dL — AB (ref 0–99)
Triglycerides: 132 mg/dL (ref 0–149)
VLDL Cholesterol Cal: 23 mg/dL (ref 5–40)

## 2024-02-28 NOTE — Addendum Note (Signed)
 Addended by: THEOTIS SHARLET PARAS on: 02/28/2024 09:13 AM   Modules accepted: Orders

## 2024-03-01 NOTE — Progress Notes (Cosign Needed)
 BH MD Outpatient Progress Note  03/08/2024 10:56 AM Shawn Brooks  MRN:  [id number]  Assessment:  Shawn Brooks presents for follow-up evaluation in-person.  He is being followed up in the clinic for major depressive disorder, taking Prozac  80 mg and Lamictal  25 mg daily.  He also has sleep issues for which he takes trazodone  100 mg nightly currently and melatonin 10 mg at bedtime.  He is also taking propranolol  10 mg twice daily for social anxiety.  Continues to be stable on the current dose of medicines, has denied any side effects or any physical concerns.  He has been sleeping well on increased dose of trazodone , denies any nightmares or flashbacks, has to frequently wake up at night to void bladder as he has stage III chronic kidney disease and chronic pain.  His mood has improved, has denied any active or passive SI/HI/AVH.  His anxiety has stayed the same, continue to get worsening anxiety in the setting of psychosocial stressors and interpersonal conflicts within the family, encouraged taking propranolol  twice daily instead of once daily which is how it was prescribed.  He has started therapy, reports good therapeutic relationship.  His alcohol use has decreased however he has increased his marijuana use to 2 g daily.  We discussed about abstinence from using marijuana, patient remains precontemplative at this time.  We did not make any changes to the prescriptions as he has had significant benefit, follow-up with him in 6 weeks.  Risk Assessment: A suicide and violence risk assessment was performed as part of this evaluation. There patient is deemed to be at chronic elevated risk for self-harm/suicide given the following factors: suicidal ideation or threats without a plan, feelings of hopelessness, lack of social support, and current substance abuse. These risk factors are mitigated by the following factors: no known access to weapons or firearms, no history of violence, motivation for  treatment, utilization of positive coping skills, expresses purpose for living, current treatment compliance, effective problem solving skills, and safe housing. The patient is deemed to be at chronic elevated risk for violence given the following factors: homicidal ideation and current substance abuse. These risk factors are mitigated by the following factors: no known history of violence towards others, no known history of threats of harm towards others, and low impulsivity. There is no acute risk for suicide or violence at this time. The patient was educated about relevant modifiable risk factors including following recommendations for treatment of psychiatric illness and abstaining from substance abuse.  While future psychiatric events cannot be accurately predicted, the patient does not currently require  acute inpatient psychiatric care and does not currently meet [place]  involuntary commitment criteria.    Identifying Information: Shawn Brooks is a 48 y.o. male with historical diagnosis of MDD, social anxiety disorder, HIV on HAART threapy, HLD, and Vitamin D  deficiency who is an established patient with [facility] for medication management. He reports progressively worsening memory problems and chronically struggles with insomnia. Memory problems currently hypothesized secondary to both insufficient treatment of MDD and possibly cannabis use although uncertain if more is contributing as his memory problems do not correlate with worsening depression. Sleep study did not show signs of OSA. Diagnosis of MDD made based on patient's depressed mood, anhedonia, poor sleep, poor appetite, inattention. MOCA performed 06/02/23 was 27/30.  Plan:  # Major depressive disorder-recurrent episode, severe with psychotic features Past medication trials: Zoloft  Status of problem: stable Interventions: -- Continue Prozac  80 mg daily --  Patient currently stable on Lamictal  25 mg  daily   #Social Anxiety Disorder -- Unclear etiology. 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 -- Continue propranolol  10 mg twice daily   # Hypersomnia Past medication trials:  Status of problem: stable Interventions: -- Continue trazodone  to 150 mg nightly -- sleep study was inconclusive the last time  REM Sleep Behavior Disorder (resolved) -increase melatonin to 10 mg at bedtime- doesn't work, she does not taking it -referral sleep to sleep medicine  Cannabis Use Disorder Substance induced mood disorder -advised decrease use, reported use last week, patient has had increased use, 2 g every day   # EtOH use disorder, in early remission Past medication trials: none Status of problem: improving Interventions: -- 3-4 beers per month -- Psychoeducation provided regarding risks of dependency, tolerance, and withdrawal as well as safe limits of use; patient expresses intent to continue to reduce use  # Memory problems -Unclear etiology but differential includes chronic cannabis use, pseudodementia 2/2 MDD, wernicke's encepahlopathy versus HIV dementia versus iatrogenic   Folate deficiency Vitamin D  deficiency -Vitamin D  supplement -Multivitamin with folate  Patient was given contact information for behavioral health clinic and was instructed to call 911 for emergencies.   Subjective:  Chief Complaint:  Medication management  Interval History:  Today, patient reports an improvement in his depression and mood.  He reports it as no new complaints .  He continues to have anxiety due to family dynamics and interpersonal conflicts.  He has been eating well and is trying to sleep better but wakes up frequently at night due to having stage III kidney disease and chronic pain, frequently getting up to void bladder.  He is denying any active or passive SI/HI/AVH.  His financial constraints have improved.  He reported that trazodone  has been helpful for sleep .  He started  therapy and reports good therapeutic relationship.  His partner and his daughter have been strong support systems for him.  His anxiety has improved but he has reported an episode of panic attack a month which he relates to being around a lot of people in stores .  Encouraged taking propranolol  as prescribed, currently he is only taking once daily at night.  His alcohol use has decreased, limited to occasional use and his marijuana use has increased to 2 g daily.  He is denying using any other substances, currently smokes about half a pack of cigarettes every day.  We encouraged cessation during the current visit, patient currently not amenable to stop marijuana use.  We discussed about continuing the same medications as he had noticed significant benefit, patient amenable to the plan.  Continued therapy was encouraged.  Patient was concerned about always being on these medications in future, discussed about tapering it down when he improves and the therapeutic benefit of medications have reduced.  Patient denied any acute concerns.  The prescription was sent to the preferred pharmacy, plan to follow-up in 6 to 8 weeks.  Visit Diagnosis:    ICD-10-CM   1. Recurrent major depressive disorder, in partial remission (HCC)  F33.41 lamoTRIgine  (LAMICTAL ) 25 MG tablet    traZODone  (DESYREL ) 150 MG tablet    FLUoxetine  (PROZAC ) 40 MG capsule    propranolol  (INDERAL ) 10 MG tablet       Past Psychiatric History:  Diagnoses: Historical diagnosis of bipolar 1 disorder Medication trials: Zoloft  Substance use:             -- Etoh: Used to have 6 beers  daily; denies history of withdrawal; now on 1 beer every ~2 weeks             -- Cannabis: Daily multiple blunts             -- Denies use of illicit drugs  Past Medical History:  Past Medical History:  Diagnosis Date   Anal condyloma    Anxiety    Blood dyscrasia    HIV   Chronic back pain    Constipation    takes miralax  daily   Depression     on meds   DJD (degenerative joint disease)    Gastric ulcer    GERD (gastroesophageal reflux disease)    Headache(784.0)    Hiatal hernia    HIV infection (HCC)    on meds   Hyperplastic colon polyp    Hypertension    on meds   Internal hemorrhoids    Sickle cell anemia (HCC)     Past Surgical History:  Procedure Laterality Date   COLONOSCOPY  2019   JMP-MAC-prep good-polyps+   ESOPHAGOGASTRODUODENOSCOPY  03/27/2012   Procedure: ESOPHAGOGASTRODUODENOSCOPY (EGD);  Surgeon: Jerrell KYM Sol, MD;  Location: [facility];  Service: Endoscopy;  Laterality: N/A;   INCISION AND DRAINAGE PERIRECTAL ABSCESS N/A 06/25/2023   Procedure: IRRIGATION AND DEBRIDEMENT PERIRECTAL ABSCESS;  Surgeon: Paola Dreama SAILOR, MD;  Location: [facility];  Service: General;  Laterality: N/A;   IRRIGATION AND DEBRIDEMENT ABSCESS N/A 03/13/2015   Procedure: IRRIGATION AND DEBRIDEMENT ABSCESS;  Surgeon: Donnice Lunger, MD;  Location: [facility];  Service: General;  Laterality: N/A;   WISDOM TOOTH EXTRACTION      Family History:  Family History  Problem Relation Age of Onset   Drug abuse Mother    Alcohol abuse Mother    Esophageal cancer Mother 58       smoker/used ETOH   Alcohol abuse Father    Drug abuse Father    Pneumonia Father    Diabetes Father    Hypertension Sister    Crohn's disease Brother    Colon cancer Maternal Grandfather    Crohn's disease Maternal Grandmother    Cancer Maternal Grandmother        patient unsure of type   Colon polyps Neg Hx    Rectal cancer Neg Hx    Stomach cancer Neg Hx     Social History:  Social History   Socioeconomic History   Marital status: Divorced    Spouse name: Not on file   Number of children: 1   Years of education: Not on file   Highest education level: Some college, no degree  Occupational History   Occupation: Disabled  Tobacco Use   Smoking status: Every Day    Current packs/day: 0.50    Average packs/day: 0.5 packs/day for 34.7 years (17.3  ttl pk-yrs)    Types: Cigarettes    Start date: 1991   Smokeless tobacco: Never   Tobacco comments:    not ready to quit.  Vaping Use   Vaping status: Never Used  Substance and Sexual Activity   Alcohol use: Yes    Comment: socially   Drug use: Yes    Frequency: 7.0 times per week    Types: Marijuana   Sexual activity: Not Currently    Partners: Male    Comment: declined condoms  Other Topics Concern   Not on file  Social History Narrative   Not on file   Social Drivers of Corporate investment banker  Strain: High Risk (01/23/2024)   Overall Financial Resource Strain (CARDIA)    Difficulty of Paying Living Expenses: Very hard  Food Insecurity: Food Insecurity Present (01/02/2024)   Hunger Vital Sign    Worried About Running Out of Food in the Last Year: Often true    Ran Out of Food in the Last Year: Often true  Transportation Needs: Unmet Transportation Needs (01/23/2024)   PRAPARE - Administrator, Civil Service (Medical): Yes    Lack of Transportation (Non-Medical): Yes  Physical Activity: Inactive (01/02/2024)   Exercise Vital Sign    Days of Exercise per Week: 0 days    Minutes of Exercise per Session: Not on file  Stress: Stress Concern Present (01/02/2024)   [employer] of Occupational Health - Occupational Stress Questionnaire    Feeling of Stress: Very much  Social Connections: Moderately Isolated (01/02/2024)   Social Connection and Isolation Panel    Frequency of Communication with Friends and Family: More than three times a week    Frequency of Social Gatherings with Friends and Family: Once a week    Attends Religious Services: Never    Database administrator or Organizations: No    Attends Engineer, structural: Not on file    Marital Status: Living with partner    Allergies:  Allergies  Allergen Reactions   Chantix  [Varenicline ] Other (See Comments)    Bad dreams   Oxycodone  Itching    Current Medications: Current Outpatient  Medications  Medication Sig Dispense Refill   acetaminophen  (TYLENOL  8 HOUR) 650 MG CR tablet Take 1 tablet (650 mg total) by mouth every 8 (eight) hours as needed for pain. 30 tablet 1   albuterol  (VENTOLIN  HFA) 108 (90 Base) MCG/ACT inhaler Inhale 2 puffs into the lungs every 6 (six) hours as needed for wheezing or shortness of breath. 18 g 1   AMBULATORY NON FORMULARY MEDICATION Medication Name: Diltiazem  2% gel:Lidocaine  5% - using your index finger, you should apply a small amount of medication inside the rectum up to your first knuckle/joint three times daily x 6-8 weeks. 45 g 1   aspirin  EC 81 MG tablet Take 1 tablet (81 mg total) by mouth daily. Swallow whole. 100 tablet 1   bictegravir-emtricitabine -tenofovir  AF (BIKTARVY) 50-200-25 MG TABS tablet Take 1 tablet by mouth daily. 30 tablet 11   Eluxadoline  (VIBERZI ) 100 MG TABS TAKE 1 TABLET BY MOUTH 1-2 TIMES DAILY 180 tablet 0   FLUoxetine  (PROZAC ) 40 MG capsule Take 2 capsules (80 mg total) by mouth daily. 180 capsule 0   lamoTRIgine  (LAMICTAL ) 25 MG tablet Take 1 tablet (25 mg total) by mouth daily. 30 tablet 1   Melatonin 10 MG CAPS Take 10 mg by mouth at bedtime. 90 capsule 0   nicotine  (NICODERM CQ  - DOSED IN MG/24 HOURS) 21 mg/24hr patch Place 1 patch (21 mg total) onto the skin daily. 28 patch 1   propranolol  (INDERAL ) 10 MG tablet Take 1 tablet (10 mg total) by mouth 2 (two) times daily as needed (anxiety). 60 tablet 1   rosuvastatin  (CRESTOR ) 10 MG tablet Take 1 tablet (10 mg total) by mouth daily. 90 tablet 3   solifenacin  (VESICARE ) 5 MG tablet Take 1 tablet (5 mg total) by mouth daily. (Patient not taking: Reported on 02/15/2024) 30 tablet 11   tadalafil  (CIALIS ) 20 MG tablet Take 1 tablet (20 mg total) by mouth daily as needed for erectile dysfunction. 20 tablet 11   traMADol  (ULTRAM ) 50  MG tablet Take 1 tablet (50 mg total) by mouth every 12 (twelve) hours as needed. 15 tablet 0   traZODone  (DESYREL ) 150 MG tablet Take 1 tablet  (150 mg total) by mouth at bedtime. 30 tablet 1   valACYclovir  (VALTREX ) 500 MG tablet TAKE 1 TABLET BY MOUTH TWICE DAILY 14 tablet 5   Vitamin D , Ergocalciferol , (DRISDOL ) 1.25 MG (50000 UNIT) CAPS capsule TAKE 1 CAPSULE BY MOUTH EVERY 7 DAYS 12 capsule 1   No current facility-administered medications for this visit.    ROS: Review of Systems  Constitutional:  Negative for activity change, appetite change, chills, diaphoresis and fatigue.  HENT:  Negative for congestion, dental problem, drooling, ear discharge and ear pain.   Eyes:  Negative for pain, discharge and itching.  Respiratory:  Negative for apnea, cough, choking and chest tightness.   Cardiovascular:  Negative for chest pain, palpitations and leg swelling.  Gastrointestinal:  Negative for abdominal distention, abdominal pain, constipation, diarrhea and nausea.  Endocrine: Negative for cold intolerance and heat intolerance.  Genitourinary:  Negative for difficulty urinating, dysuria, flank pain, frequency, hematuria and urgency.  Musculoskeletal:  Negative for arthralgias, back pain, gait problem, joint swelling, myalgias and neck pain.  Skin:  Negative for color change and pallor.  Allergic/Immunologic: Negative for environmental allergies and food allergies.  Neurological:  Negative for dizziness, seizures, syncope, facial asymmetry, speech difficulty, light-headedness, numbness and headaches.  Psychiatric/Behavioral:  Negative for agitation, behavioral problems, confusion, decreased concentration, dysphoric mood, hallucinations, self-injury, sleep disturbance and suicidal ideas. The patient is not nervous/anxious and is not hyperactive.     Objective:  Psychiatric Specialty Exam: Blood pressure 126/82, pulse 64, height 5' 9 (1.753 m), weight 226 lb (102.5 kg).Body mass index is 33.37 kg/m.  General Appearance: Casual  Eye Contact:  Good  Speech:  Clear and Coherent and Normal Rate  Volume:  Normal  Mood:  Depressed   Affect:  Depressed and Tearful  Thought Process:  Coherent, Goal Directed, and Linear  Orientation:  Full (Time, Place, and Person)  Thought Content: Logical   Suicidal Thoughts:  No  Homicidal Thoughts:  No  Memory:  Remote;   Good  Judgment:  Fair  Insight:  Fair  Psychomotor Activity:  Normal  Concentration:  Concentration: Good and Attention Span: Good              Assets:  Communication Skills Desire for Improvement Financial Resources/Insurance Housing Intimacy Leisure Time Physical Health Resilience Social Support Talents/Skills Transportation Vocational/Educational  ADL's:  Intact  Cognition: WNL  Sleep:  Good   PE: General: well-appearing; no acute distress  Pulm: no increased work of breathing on room air  Strength & Muscle Tone: within normal limits Neuro: no focal neurological deficits observed  Gait & Station: normal  Metabolic Disorder Labs: Lab Results  Component Value Date   HGBA1C 5.7 04/08/2023   MPG 117 (H) 11/16/2013   No results found for: PROLACTIN Lab Results  Component Value Date   CHOL 250 (H) 02/22/2024   TRIG 132 02/22/2024   HDL 63 02/22/2024   CHOLHDL 4.0 02/22/2024   VLDL 28 05/12/2016   LDLCALC 164 (H) 02/22/2024   LDLCALC 124 (H) 04/08/2023   Lab Results  Component Value Date   TSH 2.370 03/18/2023   TSH 1.150 09/01/2017    Therapeutic Level Labs: No results found for: LITHIUM No results found for: VALPROATE No results found for: CBMZ  Screenings: GAD-7    Flowsheet Row Office Visit from 10/12/2023 in  [facility] - A Dept Of [place]. [facility] Office Visit from 08/30/2023 in [facility] - A Dept Of [place]. [facility] Office Visit from 04/08/2023 in [facility] [place] - A Dept Of [place]. [facility] Office Visit from 10/07/2022 in [facility] [place] - A Dept Of [place]. [facility] Video Visit from  03/18/2022 in [facility]  Total GAD-7 Score 19 7 15  0 16   Mini-Mental    Flowsheet Row Office Visit from 01/05/2024 in [facility]  Total Score (max 30 points ) 28   PHQ2-9    Flowsheet Row Clinical Support from 03/08/2024 in [facility] Clinical Support from 01/26/2024 in [facility] Office Visit from 01/16/2024 in [facility] [place] - A Dept Of [place]. [facility] Counselor from 10/26/2023 in [facility] Counselor from 10/19/2023 in [facility]  PHQ-2 Total Score 2 5 6 6 6   PHQ-9 Total Score 6 15 16 21 23    Flowsheet Row Counselor from 10/26/2023 in [facility] Counselor from 10/19/2023 in [facility] ED to Hosp-Admission (Discharged) from 06/23/2023 in  [facility]  SURGICAL  C-SSRS RISK CATEGORY Moderate Risk Moderate Risk No Risk    Collaboration of Care: Collaboration of Care:   Patient/Guardian was advised Release of Information must be obtained prior to any record release in order to collaborate their care with an outside provider. Patient/Guardian was advised if they have not already done so to contact the registration department to sign all necessary forms in order for us  to release information regarding their care.   Consent: Patient/Guardian gives verbal consent for treatment and assignment of benefits for services provided during this visit. Patient/Guardian expressed understanding and agreed to proceed.   A total of 30 minutes was spent involved in face to face clinical care, chart review, and documentation.   Arneta Mahmood, MD 03/08/2024, 10:56 AM

## 2024-03-05 NOTE — Progress Notes (Signed)
 I personally performed substantive portion of this visit, including history, exam, labs/imaging review, and decision making. Agree with documentation         Constance ONEIDA Passer, MD [facility] [phone]  pager   [phone] cell 03/05/2024, 3:11 PM

## 2024-03-08 ENCOUNTER — Ambulatory Visit (INDEPENDENT_AMBULATORY_CARE_PROVIDER_SITE_OTHER): Payer: MEDICAID

## 2024-03-08 VITALS — BP 126/82 | HR 64 | Ht 69.0 in | Wt 226.0 lb

## 2024-03-08 DIAGNOSIS — F3341 Major depressive disorder, recurrent, in partial remission: Secondary | ICD-10-CM | POA: Diagnosis not present

## 2024-03-08 MED ORDER — TRAZODONE HCL 150 MG PO TABS: 150.0000 mg | ORAL_TABLET | Freq: Every day | ORAL | 1 refills | Status: DC

## 2024-03-08 MED ORDER — FLUOXETINE HCL 40 MG PO CAPS: 80.0000 mg | ORAL_CAPSULE | Freq: Every day | ORAL | 0 refills | Status: DC

## 2024-03-08 MED ORDER — PROPRANOLOL HCL 10 MG PO TABS: 10.0000 mg | ORAL_TABLET | Freq: Two times a day (BID) | ORAL | 1 refills | Status: DC | PRN

## 2024-03-08 MED ORDER — LAMOTRIGINE 25 MG PO TABS: 25.0000 mg | ORAL_TABLET | Freq: Every day | ORAL | 1 refills | Status: DC

## 2024-03-08 NOTE — Addendum Note (Signed)
 Addended byBETHA GREGG LEK on: 03/08/2024 10:29 AM   Modules accepted: Orders

## 2024-03-08 NOTE — Telephone Encounter (Signed)
 Called and spoke to pt and stated that we need labs asap. Pt voiced understanding and plans to complete soon

## 2024-03-08 NOTE — Telephone Encounter (Signed)
 In July 2025, RPR came back negative, HIV RNA not detected In April 2025, hepatitis B serology negative; hepatitis C nonreactive; TSH 1.86, CMP within normal limits. Will call patient to obtain a B12 level.  Shawn Brooks can you please call patient to stop by the office to obtain B12 level. Thanks

## 2024-03-09 ENCOUNTER — Other Ambulatory Visit (INDEPENDENT_AMBULATORY_CARE_PROVIDER_SITE_OTHER): Payer: MEDICAID

## 2024-03-09 DIAGNOSIS — Z0289 Encounter for other administrative examinations: Secondary | ICD-10-CM

## 2024-03-09 DIAGNOSIS — G3184 Mild cognitive impairment, so stated: Secondary | ICD-10-CM

## 2024-03-09 NOTE — Addendum Note (Signed)
 Addended by: MERCY DOMINO A on: 03/09/2024 08:19 PM   Modules accepted: Level of Service

## 2024-03-10 LAB — VITAMIN B12: Vitamin B-12: 440 pg/mL (ref 232–1245)

## 2024-03-12 ENCOUNTER — Ambulatory Visit: Payer: Self-pay | Admitting: Neurology

## 2024-03-12 NOTE — Telephone Encounter (Signed)
 From Dr. Gregg: His B12 level is within normal limits, no other explanation of the cognitive changes. Can we submit again for the Brain MRI. Thanks

## 2024-03-13 NOTE — Telephone Encounter (Signed)
 New PA submitted. trillium pending tracking #[id number]

## 2024-03-16 ENCOUNTER — Other Ambulatory Visit: Payer: Self-pay | Admitting: Internal Medicine

## 2024-03-16 DIAGNOSIS — E559 Vitamin D deficiency, unspecified: Secondary | ICD-10-CM

## 2024-03-26 NOTE — Telephone Encounter (Signed)
 His MRI was denied again (the third time) saying that his memory test score wasn't low enough to show memory problems. If you would like to call to schedule peer to peer the phone number is [phone]  And tracking #[id number]

## 2024-03-30 ENCOUNTER — Encounter: Payer: Self-pay | Admitting: Internal Medicine

## 2024-04-02 NOTE — Telephone Encounter (Signed)
 Sorry to hear that he is having issues.  I do think we should stop the Viberzi  given the constipation that is resulting. He has a history of hemorrhoids which are likely the explanation for the intermittent bleeding. I would be off the Viberzi  and let us  know within 24 to 48 hours if the abdominal pain is improving.  If abdominal pain continues then I would recommend we consider a CT scan. Have him update us  in about 48 hours, sooner if symptoms are worsening.

## 2024-04-04 ENCOUNTER — Telehealth: Payer: Self-pay | Admitting: Pharmacist

## 2024-04-04 ENCOUNTER — Other Ambulatory Visit (HOSPITAL_COMMUNITY): Payer: Self-pay

## 2024-04-04 ENCOUNTER — Encounter: Payer: Self-pay | Admitting: Pharmacist

## 2024-04-04 ENCOUNTER — Ambulatory Visit: Payer: MEDICAID | Attending: Cardiology | Admitting: Pharmacist

## 2024-04-04 DIAGNOSIS — E785 Hyperlipidemia, unspecified: Secondary | ICD-10-CM | POA: Insufficient documentation

## 2024-04-04 DIAGNOSIS — E782 Mixed hyperlipidemia: Secondary | ICD-10-CM

## 2024-04-04 LAB — LIPID PANEL

## 2024-04-04 NOTE — Patient Instructions (Addendum)
 Your Results:             Your most recent labs Goal  Total Cholesterol 250 < 200  Triglycerides 132 < 150  HDL (happy/good cholesterol) 63 > 40  LDL (lousy/bad cholesterol 164 < 70   Medication changes: continue taking Crestor  10 mg daily.  We will start the process to get PCSK9i( Repatha or Praluent)  covered by your insurance.  Once the prior authorization is complete, we will call you to let you know and confirm pharmacy information.     Praluent is a cholesterol medication that improved your body's ability to get rid of bad cholesterol known as LDL. It can lower your LDL up to 60%. It is an injection that is given under the skin every 2 weeks. The most common side effects of Praluent include runny nose, symptoms of the common cold, rarely flu or flu-like symptoms, back/muscle pain in about 3-4% of the patients, and redness, pain, or bruising at the injection site.    Repatha is a cholesterol medication that improved your body's ability to get rid of bad cholesterol known as LDL. It can lower your LDL up to 60%! It is an injection that is given under the skin every 2 weeks. The medication often requires a prior authorization from your insurance company. The most common side effects of Repatha include runny nose, symptoms of the common cold, rarely flu or flu-like symptoms, back/muscle pain in about 3-4% of the patients, and redness, pain, or bruising at the injection site.   Lab orders: We want to repeat labs after 2-3 months.  We will send you a lab order to remind you once we get closer to that time.

## 2024-04-04 NOTE — Progress Notes (Signed)
 Patient ID: Shawn Brooks                 DOB: 12-24-1975                    MRN: [id number]      HPI: Shawn Brooks is a 48 y.o. male patient referred to lipid clinic by Dr.Skains. PMH is significant for OSA, HLD, tobacco use, hypertension.   Crestor  dose was increased from 5 mg to 10 mg daily 2 months ago. Patient presented today for lipid clinic today. Reports he has been tolerating the increased dose statin well. Form past few years he has been following healthy diet. There are some room for improvement which he is working on. Due to chronic back pain he is unable to exercise however will be going to PT session starting from next week   Reviewed options for lowering LDL cholesterol, including ezetimibe, PCSK-9 inhibitors, bempedoic acid and inclisiran.  Discussed mechanisms of action, dosing, side effects and potential decreases in LDL cholesterol.  Also reviewed cost information and potential options for patient assistance.  Current Medications: Crestor  10 mg daily  Intolerances: none  Risk Factors: Mild nonobstructive CAD, hypertension, CAC score 12.2 ag units  LDL goal: <100 lenient goal, <71 mg/dl 10 yrs ASCVD risk score 8.7%  Last lipid lab TC 250, LDLc 164, TG 132, HDL 63   Diet: eats healthy  Fried food - once  or twice a week  B- sausage ,bacon, oatmeal grits,  Lunch- skip or leftover from dinner, microwave dinner  Dinner- pork, green beans, corn, potatoes  Snack: chips, cereal, pasta in a can  Drink: water, juice,   Exercise: about to start PT for back on Oct 13   Family History:  Relation Problem Comments  Mother Metallurgist) Alcohol abuse   Drug abuse   Esophageal cancer (Age: 71) smoker/used ETOH    Father (Deceased) Alcohol abuse   Diabetes   Drug abuse   Pneumonia     Sister Metallurgist) Hypertension     Brother Metallurgist) Crohn's disease     Maternal Grandmother (Deceased) Cancer patient unsure of type  Crohn's disease     Maternal Grandfather (Deceased) Colon  cancer and dyslipidemia      Paternal Grandmother (Deceased)   Paternal Grandfather (Deceased)     Social History:  Alcohol: 3 times week 1 std drink  Smoking: 1/2 pack per day  Labs:  Lipid Panel     Component Value Date/Time   CHOL 250 (H) 02/22/2024 0910   TRIG 132 02/22/2024 0910   HDL 63 02/22/2024 0910   CHOLHDL 4.0 02/22/2024 0910   CHOLHDL 4.7 08/09/2019 0922   VLDL 28 05/12/2016 0955   LDLCALC 164 (H) 02/22/2024 0910   LDLCALC  08/09/2019 0922     Comment:     . LDL cholesterol not calculated. Triglyceride levels greater than 400 mg/dL invalidate calculated LDL results. . Reference range: <100 . Desirable range <100 mg/dL for primary prevention;   <70 mg/dL for patients with CHD or diabetic patients  with > or = 2 CHD risk factors. SABRA LDL-C is now calculated using the Martin-Hopkins  calculation, which is a validated novel method providing  better accuracy than the Friedewald equation in the  estimation of LDL-C.  Shawn Brooks et al. Shawn Brooks. 7986;689(80): 2061-2068  (http://education.[facility].com/faq/FAQ164)    LABVLDL 23 02/22/2024 0910    Past Medical History:  Diagnosis Date   Anal condyloma    Anxiety  Blood dyscrasia    HIV   Chronic back pain    Constipation    takes miralax  daily   Depression    on meds   DJD (degenerative joint disease)    Gastric ulcer    GERD (gastroesophageal reflux disease)    Headache(784.0)    Hiatal hernia    HIV infection (HCC)    on meds   Hyperplastic colon polyp    Hypertension    on meds   Internal hemorrhoids    Sickle cell anemia (HCC)     Current Outpatient Medications on File Prior to Visit  Medication Sig Dispense Refill   acetaminophen  (TYLENOL  8 HOUR) 650 MG CR tablet Take 1 tablet (650 mg total) by mouth every 8 (eight) hours as needed for pain. 30 tablet 1   albuterol  (VENTOLIN  HFA) 108 (90 Base) MCG/ACT inhaler Inhale 2 puffs into the lungs every 6 (six) hours as needed for wheezing or  shortness of breath. 18 g 1   AMBULATORY NON FORMULARY MEDICATION Medication Name: Diltiazem  2% gel:Lidocaine  5% - using your index finger, you should apply a small amount of medication inside the rectum up to your first knuckle/joint three times daily x 6-8 weeks. 45 g 1   aspirin  EC 81 MG tablet Take 1 tablet (81 mg total) by mouth daily. Swallow whole. 100 tablet 1   bictegravir-emtricitabine -tenofovir  AF (BIKTARVY) 50-200-25 MG TABS tablet Take 1 tablet by mouth daily. 30 tablet 11   Eluxadoline  (VIBERZI ) 100 MG TABS TAKE 1 TABLET BY MOUTH 1-2 TIMES DAILY 180 tablet 0   FLUoxetine  (PROZAC ) 40 MG capsule Take 2 capsules (80 mg total) by mouth daily. 180 capsule 0   lamoTRIgine  (LAMICTAL ) 25 MG tablet Take 1 tablet (25 mg total) by mouth daily. 30 tablet 1   nicotine  (NICODERM CQ  - DOSED IN MG/24 HOURS) 21 mg/24hr patch Place 1 patch (21 mg total) onto the skin daily. 28 patch 1   propranolol  (INDERAL ) 10 MG tablet Take 1 tablet (10 mg total) by mouth 2 (two) times daily as needed (anxiety). 60 tablet 1   rosuvastatin  (CRESTOR ) 10 MG tablet Take 1 tablet (10 mg total) by mouth daily. 90 tablet 3   solifenacin  (VESICARE ) 5 MG tablet Take 1 tablet (5 mg total) by mouth daily. (Patient not taking: Reported on 02/15/2024) 30 tablet 11   tadalafil  (CIALIS ) 20 MG tablet Take 1 tablet (20 mg total) by mouth daily as needed for erectile dysfunction. 20 tablet 11   traMADol  (ULTRAM ) 50 MG tablet Take 1 tablet (50 mg total) by mouth every 12 (twelve) hours as needed. 15 tablet 0   traZODone  (DESYREL ) 150 MG tablet Take 1 tablet (150 mg total) by mouth at bedtime. 30 tablet 1   valACYclovir  (VALTREX ) 500 MG tablet TAKE 1 TABLET BY MOUTH TWICE DAILY 14 tablet 5   No current facility-administered medications on file prior to visit.    Allergies  Allergen Reactions   Chantix  [Varenicline ] Other (See Comments)    Bad dreams   Oxycodone  Itching    Assessment/Plan:  1. Hyperlipidemia -  Problem   Dyslipidemia   Qualifier: Diagnosis of  By: Lonita NP, Floria      Dyslipidemia Assessment:  LDL goal: <70 mg/dl last LDLc 835 mg/dl (91/7974) while on Crestor  5 mg daily  Statin Dose was increased to 10 mg daily; tolerates it well  Reiterated importance of heart healthy diet Discussed next potential options (PCSK-9 inhibitors, bempedoic acid and inclisiran); cost, dosing efficacy, side  effects   Plan: Continue taking current medications (Crestor  10 mg daily) will get updated lipid lab to see the effect of increased dose  Will apply for PA for PCSK9i; will inform patient upon approval  next  Lipid lab due in 2-3 months after starting PCSK9i    Thank you,  Robbi Blanch, Pharm.D [place] Shawn Brooks. [facility] [address], [place], [place] [address] Phone: [phone]; Fax: [phone]

## 2024-04-04 NOTE — Telephone Encounter (Signed)
 Pharmacy Patient Advocate Encounter   Received notification from Physician's Office that prior authorization for REPATHA is required/requested.   Insurance verification completed.   The patient is insured through [facility] MEDICAID.   Per test claim: PA required; PA submitted to above mentioned insurance via Latent Key/confirmation #/EOC [id number] Status is pending

## 2024-04-04 NOTE — Assessment & Plan Note (Signed)
 Assessment:  LDL goal: <70 mg/dl last LDLc 835 mg/dl (91/7974) while on Crestor  5 mg daily  Statin Dose was increased to 10 mg daily; tolerates it well  Reiterated importance of heart healthy diet Discussed next potential options (PCSK-9 inhibitors, bempedoic acid and inclisiran); cost, dosing efficacy, side effects   Plan: Continue taking current medications (Crestor  10 mg daily) will get updated lipid lab to see the effect of increased dose  Will apply for PA for PCSK9i; will inform patient upon approval  next  Lipid lab due in 2-3 months after starting PCSK9i

## 2024-04-05 LAB — HEPATIC FUNCTION PANEL
ALT: 27 IU/L (ref 0–44)
AST: 30 IU/L (ref 0–40)
Albumin: 4.2 g/dL (ref 4.1–5.1)
Alkaline Phosphatase: 111 IU/L (ref 47–123)
Bilirubin Total: <0.2 mg/dL (ref 0.0–1.2)
Bilirubin, Direct: <0.08 mg/dL (ref 0.00–0.40)
Total Protein: 6.5 g/dL (ref 6.0–8.5)

## 2024-04-05 LAB — LIPID PANEL
Cholesterol, Total: 168 mg/dL (ref 100–199)
HDL: 55 mg/dL (ref >39)
LDL CALC COMMENT:: 3.1 ratio (ref 0.0–5.0)
LDL Chol Calc (NIH): 97 mg/dL (ref 0–99)
Triglycerides: 88 mg/dL (ref 0–149)
VLDL Cholesterol Cal: 16 mg/dL (ref 5–40)

## 2024-04-05 MED ORDER — EZETIMIBE 10 MG PO TABS: 10.0000 mg | ORAL_TABLET | Freq: Every day | ORAL | 3 refills | Status: AC

## 2024-04-05 NOTE — Addendum Note (Signed)
 Addended by: Mirka Barbone K on: 04/05/2024 04:22 PM   Modules accepted: Orders

## 2024-04-05 NOTE — Telephone Encounter (Signed)
 Pharmacy Patient Advocate Encounter  Received notification from [facility] MEDICAID that Prior Authorization for REPATHA has been DENIED.  Full denial letter will be uploaded to the media tab. See denial reason below. CRITERIA NOT MET PER PLAN, SEE FULL DENIAL REASONS IN CHART MEDIA

## 2024-04-05 NOTE — Telephone Encounter (Signed)
 Denial reasons discussed with the patient. Due to chronic back pain issue pt doe not want to try increased dose statin. Responded very well Crestor  dose increment from 5 mg daily to 10 mg daily LDL went down from 164 to 97 mg/dl. In agreement to add Zetia to statin. Will repeat lab Mid Jan 2026.

## 2024-04-09 ENCOUNTER — Encounter: Payer: MEDICAID | Attending: Psychology | Admitting: Psychology

## 2024-04-09 DIAGNOSIS — R4189 Other symptoms and signs involving cognitive functions and awareness: Secondary | ICD-10-CM | POA: Insufficient documentation

## 2024-04-09 DIAGNOSIS — R413 Other amnesia: Secondary | ICD-10-CM | POA: Insufficient documentation

## 2024-04-10 ENCOUNTER — Telehealth: Payer: Self-pay | Admitting: Neurology

## 2024-04-10 NOTE — Telephone Encounter (Signed)
 Pt is asking that the referral for his pain management be transferred from [facility].  He no longer wishes to remain there for his pain management

## 2024-04-12 ENCOUNTER — Encounter: Payer: Self-pay | Admitting: Orthopaedic Surgery

## 2024-04-12 NOTE — Telephone Encounter (Signed)
 Please send another referral to pain management within [facility].  Thanks.

## 2024-04-13 ENCOUNTER — Other Ambulatory Visit: Payer: Self-pay

## 2024-04-13 DIAGNOSIS — G8929 Other chronic pain: Secondary | ICD-10-CM

## 2024-04-16 ENCOUNTER — Other Ambulatory Visit: Payer: Self-pay | Admitting: Internal Medicine

## 2024-04-16 ENCOUNTER — Encounter: Payer: Self-pay | Admitting: Psychology

## 2024-04-16 ENCOUNTER — Encounter (HOSPITAL_BASED_OUTPATIENT_CLINIC_OR_DEPARTMENT_OTHER): Payer: MEDICAID | Admitting: Psychology

## 2024-04-16 ENCOUNTER — Other Ambulatory Visit: Payer: Self-pay

## 2024-04-16 DIAGNOSIS — R4189 Other symptoms and signs involving cognitive functions and awareness: Secondary | ICD-10-CM | POA: Diagnosis not present

## 2024-04-16 DIAGNOSIS — R413 Other amnesia: Secondary | ICD-10-CM | POA: Diagnosis present

## 2024-04-16 DIAGNOSIS — R109 Unspecified abdominal pain: Secondary | ICD-10-CM
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
## 2024-04-16 NOTE — Telephone Encounter (Signed)
 I am sorry you had a bad experience. Please reach out to your PCP for them To send a new referral.   Thank you,  Safi Culotta RN

## 2024-04-17 ENCOUNTER — Other Ambulatory Visit: Payer: Self-pay

## 2024-04-17 DIAGNOSIS — G8929 Other chronic pain: Secondary | ICD-10-CM

## 2024-04-17 NOTE — Telephone Encounter (Signed)
 I cannot refill muscle relaxer.  If needing this for his back, could come in and see megan for further eval

## 2024-04-19 ENCOUNTER — Other Ambulatory Visit: Payer: Self-pay | Admitting: Medical Genetics

## 2024-04-19 DIAGNOSIS — Z006 Encounter for examination for normal comparison and control in clinical research program: Secondary | ICD-10-CM

## 2024-04-26 ENCOUNTER — Encounter: Payer: Self-pay | Admitting: Physical Medicine and Rehabilitation

## 2024-04-26 ENCOUNTER — Ambulatory Visit (HOSPITAL_COMMUNITY): Payer: MEDICAID

## 2024-04-26 ENCOUNTER — Ambulatory Visit: Payer: MEDICAID | Admitting: Physical Medicine and Rehabilitation

## 2024-04-26 DIAGNOSIS — M5442 Lumbago with sciatica, left side: Secondary | ICD-10-CM | POA: Diagnosis not present

## 2024-04-26 DIAGNOSIS — M5441 Lumbago with sciatica, right side: Secondary | ICD-10-CM | POA: Diagnosis not present

## 2024-04-26 DIAGNOSIS — G894 Chronic pain syndrome: Secondary | ICD-10-CM

## 2024-04-26 DIAGNOSIS — M5416 Radiculopathy, lumbar region: Secondary | ICD-10-CM

## 2024-04-26 DIAGNOSIS — G8929 Other chronic pain: Secondary | ICD-10-CM

## 2024-04-26 MED ORDER — METHOCARBAMOL 500 MG PO TABS: 500.0000 mg | ORAL_TABLET | Freq: Three times a day (TID) | ORAL | 0 refills | Status: DC

## 2024-04-26 NOTE — Progress Notes (Unsigned)
 Pain Scale   Average Pain 5 Patient advised he has chronic lower back pain radiating bilaterally to legs with out relief.        +Driver, -BT, -Dye Allergies.
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
## 2024-04-26 NOTE — Progress Notes (Unsigned)
 Shawn Brooks - 48 y.o. male MRN [id number]  Date of birth: [birth year]  Office Visit Note: Visit Date: 04/26/2024 PCP: Vicci Barnie NOVAK, MD Referred by: Vicci Barnie NOVAK, MD  Subjective: Chief Complaint  Patient presents with   Lower Back - Pain   HPI: Shawn Brooks is a 48 y.o. male who comes in today per the request of Shawn Brooks, [place] for evaluation of chronic, worsening and severe bilateral lower back pain radiating to buttocks, hips and down posterolateral legs to feet. Also reports numbness/tingling to legs and feet. Pain ongoing for several years, approximately 28 years. His pain started after multiple work injuries. His pain worsens with prolonged standing. 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 He describes pain as sore and tight sensation, currently rates as 8 out of 10. Some relief of pain with home exercise regimen, rest and use of medications. He reports increased pain with home exercise regimen. He is wearing lumbar brace today. He was previously managed from chronic pain standpoint at [facility], however he recently received new referral to a different pain clinic. He does report good relief of pain with Robaxin  and Tramadol . No history of lumbar surgery/injections. Patient denies focal weakness. No recent trauma or falls. He is ambulating with cane today. He does see Dr. [name] from orthopedic standpoint, issues with chronic bilateral knee pain.   Patients course is complicated by chronic pain syndrome, depression, anxiety, cannabis use disorder and HIV.      Review of Systems  Musculoskeletal:  Positive for back pain.  Neurological:  Positive for tingling. Negative for sensory change, focal weakness and weakness.  All other systems reviewed and are negative.  Otherwise per HPI.  Assessment & Plan: Visit Diagnoses:    ICD-10-CM   1. Chronic bilateral low back pain with bilateral sciatica  M54.42    M54.41    G89.29     2. Lumbar  radiculopathy  M54.16     3. Chronic pain syndrome  G89.4        Plan: Findings:  Chronic, worsening and severe bilateral lower back radiating to buttocks, hips and down posterolateral legs to feet. Paresthesias to legs and feet. Patient continues to have severe pain despite good conservative therapies such as home exercise regimen, rest and use of medications. Patients clinical presentation and exam are consistent with lumbar radiculopathy. We discussed treatment plan in detail today. His primary care provider Dr. Vicci has placed order for lumbar MRI imaging. Patient is currently undergoing appeal process with his insurance. I instructed patient to let us  know when he was able to undergo MRI imaging. He did request refill of Tramadol , I encouraged him to follow up with [facility] or new pain clinic for continued prescription. I explained to him that we do not treat chronic pain with long term narcotic prescriptions. Depending on results of MRI imaging we discussed possibility of performing lumbar epidural steroid injection. I did prescribed short course of Robaxin  for him today. He has no questions at this time. No red flag symptoms noted upon exam today.     Meds & Orders:  Meds ordered this encounter  Medications   methocarbamol  (ROBAXIN ) 500 MG tablet    Sig: Take 1 tablet (500 mg total) by mouth 3 (three) times daily.    Dispense:  90 tablet    Refill:  0   No orders of the defined types were placed in this encounter.   Follow-up: Return if symptoms worsen  or fail to improve.   Procedures: No procedures performed      Clinical History: Study Result  Narrative & Impression CLINICAL DATA:  Acute on chronic low back pain, no stated injury   EXAM: LUMBAR SPINE - COMPLETE 4+ VIEW   COMPARISON:  07/25/2017   FINDINGS: There is no evidence of lumbar spine fracture. Alignment is normal. Intervertebral disc spaces are maintained.   IMPRESSION: No fracture or  dislocation of the lumbar spine. Intervertebral disc spaces are maintained.     Electronically Signed   By: Marolyn JONETTA Jaksch M.D.   On: 01/22/2024 13:39   He reports that he has been smoking cigarettes. He started smoking about 34 years ago. He has a 17.4 pack-year smoking history. He has never used smokeless tobacco. No results for input(s): HGBA1C, LABURIC in the last 8760 hours.  Objective:  VS:  HT:    WT:   BMI:     BP:   HR: bpm  TEMP: ( )  RESP:  Physical Exam Vitals and nursing note reviewed.  HENT:     Head: Normocephalic and atraumatic.     Right Ear: External ear normal.     Left Ear: External ear normal.     Nose: Nose normal.     Mouth/Throat:     Mouth: Mucous membranes are moist.  Eyes:     Extraocular Movements: Extraocular movements intact.  Cardiovascular:     Rate and Rhythm: Normal rate.     Pulses: Normal pulses.  Pulmonary:     Effort: Pulmonary effort is normal.  Abdominal:     General: Abdomen is flat. There is no distension.  Musculoskeletal:        General: Tenderness present.     Cervical back: Normal range of motion.     Comments: Exam limited today likely due to pain and decreased effort. Patient is slow to rise from seated position to standing. Good lumbar range of motion. No pain noted with facet loading. 5/5 strength noted with bilateral hip flexion, knee flexion/extension, ankle dorsiflexion/plantarflexion and EHL. No clonus noted bilaterally. No pain upon palpation of greater trochanters. No pain with internal/external rotation of bilateral hips. Sensation intact bilaterally. Negative slump test bilaterally. Ambulates with cane, gait slow and unsteady.     Skin:    General: Skin is warm and dry.     Capillary Refill: Capillary refill takes less than 2 seconds.  Neurological:     General: No focal deficit present.     Mental Status: He is alert and oriented to person, place, and time.  Psychiatric:        Mood and Affect: Mood normal.         Behavior: Behavior normal.     Ortho Exam  Imaging: No results found.  Past Medical/Family/Surgical/Social History: Medications & Allergies reviewed per EMR, new medications updated. 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   Perianal abscess 06/24/2023   Acute urinary retention 06/24/2023   AKI (acute kidney injury) 06/24/2023   Anal fissure 06/17/2023   Folate deficiency 04/08/2023   Obstructive sleep apnea 03/10/2023   Severe episode of recurrent major depressive disorder, with psychotic features (HCC) 02/24/2023   Social anxiety disorder 02/24/2023   Class 2 severe obesity due to excess calories with  serious comorbidity and body mass index (BMI) of 35.0 to 35.9 in adult 10/07/2022   Influenza vaccine refused 06/17/2020   Esophageal dysphagia 06/17/2020   Mixed hyperlipidemia 06/17/2020   Obesity, Class I, BMI 30-34.9 06/17/2020   History of farsightedness 06/17/2020   Chronic night sweats 04/10/2020   Counseled about COVID-19 virus infection 09/18/2019   Urinary frequency 12/26/2018   Medication monitoring encounter 08/08/2018   Memory problem 09/01/2017   Vitamin D  deficiency 09/01/2017   Primary osteoarthritis of left knee 05/02/2017   Prediabetes 12/22/2016   Tobacco abuse 05/31/2016   Lumbar transverse process fracture (HCC) 10/02/2015   Screening examination for sexually transmitted disease 04/04/2014   Thoracic or lumbosacral neuritis or radiculitis 01/15/2013   H/O gastrointestinal disease 01/15/2013   Lumbar radiculopathy 01/15/2013   Carcinoma in situ of anal canal 11/17/2012   Condyloma acuminatum 11/17/2012   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   AGW (anogenital warts) 11/17/2012   Hemorrhoid  09/27/2012   BRBPR (bright red blood per rectum) 07/27/2012   Prostatitis 04/12/2012   Constipation 03/09/2012   Callus 08/16/2011   Ingrown toenail 08/16/2011   Chronic low back pain 08/16/2011   Dyslipidemia 09/03/2010   Herpes simplex virus (HSV) infection 07/06/2010   Organic impotence 07/06/2010   BURSITIS, KNEE 05/06/2010   Human immunodeficiency virus (HIV) disease (HCC) 04/16/2010   Past Medical History:  Diagnosis Date   Anal condyloma    Anxiety    Blood dyscrasia    HIV   Chronic back pain    Constipation    takes miralax  daily   Depression    on meds   DJD (degenerative joint disease)    Gastric ulcer    GERD (gastroesophageal reflux disease)    Headache(784.0)    Hiatal hernia    HIV infection (HCC)    on meds   Hyperplastic colon polyp    Hypertension    on meds   Internal hemorrhoids    Sickle cell anemia (HCC)    Family History  Problem Relation Age of Onset   Drug abuse Mother    Alcohol abuse Mother    Esophageal cancer Mother 52       smoker/used ETOH   Alcohol abuse Father    Drug abuse Father    Pneumonia Father    Diabetes Father    Hypertension Sister    Crohn's disease Brother    Colon cancer Maternal Grandfather    Crohn's disease Maternal Grandmother    Cancer Maternal Grandmother        patient unsure of type   Colon polyps Neg Hx    Rectal cancer Neg Hx    Stomach cancer Neg Hx    Past Surgical History:  Procedure Laterality Date   COLONOSCOPY  2019   JMP-MAC-prep good-polyps+   ESOPHAGOGASTRODUODENOSCOPY  03/27/2012   Procedure: ESOPHAGOGASTRODUODENOSCOPY (EGD);  Surgeon: Jerrell KYM Sol, MD;  Location: [facility];  Service: Endoscopy;  Laterality: N/A;   INCISION AND DRAINAGE PERIRECTAL ABSCESS N/A 06/25/2023   Procedure: IRRIGATION AND DEBRIDEMENT PERIRECTAL ABSCESS;  Surgeon: Paola Dreama SAILOR, MD;  Location: [facility];  Service: General;  Laterality: N/A;   IRRIGATION AND DEBRIDEMENT ABSCESS N/A 03/13/2015    Procedure: IRRIGATION AND DEBRIDEMENT ABSCESS;  Surgeon: Donnice Lunger, MD;  Location: [facility];  Service: General;  Laterality: N/A;   WISDOM TOOTH EXTRACTION     Social History   Occupational History   Occupation: Disabled  Tobacco Use   Smoking status: Every Day  Current packs/day: 0.50    Average packs/day: 0.5 packs/day for 34.8 years (17.4 ttl pk-yrs)    Types: Cigarettes    Start date: 66   Smokeless tobacco: Never   Tobacco comments:    not ready to quit.  Vaping Use   Vaping status: Never Used  Substance and Sexual Activity   Alcohol use: Yes    Comment: socially   Drug use: Yes    Frequency: 7.0 times per week    Types: Marijuana   Sexual activity: Not Currently    Partners: Male    Comment: declined condoms

## 2024-04-27 ENCOUNTER — Encounter: Payer: Self-pay | Admitting: Neurology

## 2024-04-27 ENCOUNTER — Ambulatory Visit (HOSPITAL_COMMUNITY)
Admission: RE | Admit: 2024-04-27 | Discharge: 2024-04-27 | Disposition: A | Discharge status: Home / Self Care | Payer: MEDICAID | Source: Ambulatory Visit | Attending: Internal Medicine | Admitting: Internal Medicine

## 2024-04-27 DIAGNOSIS — R109 Unspecified abdominal pain: Secondary | ICD-10-CM | POA: Diagnosis present

## 2024-04-27 LAB — POCT I-STAT CREATININE: Creatinine, Ser: 1.5 mg/dL — ABNORMAL HIGH (ref 0.61–1.24)

## 2024-04-27 MED ORDER — IOHEXOL 300 MG/ML  SOLN
100.0000 mL | Freq: Once | INTRAMUSCULAR | Status: AC | PRN
  Administered 2024-04-27: 100 mL via INTRAVENOUS

## 2024-04-27 MED ORDER — IOHEXOL 9 MG/ML PO SOLN
ORAL | Status: AC
  Filled 2024-04-27: qty 1000

## 2024-04-27 MED ORDER — IOHEXOL 9 MG/ML PO SOLN
500.0000 mL | ORAL | Status: AC
Start: 2024-04-27 — End: 2024-04-27
  Administered 2024-04-27 (×2): 500 mL via ORAL

## 2024-04-30 ENCOUNTER — Encounter: Payer: Self-pay | Admitting: Radiology

## 2024-04-30 ENCOUNTER — Encounter: Payer: MEDICAID | Attending: Psychology

## 2024-04-30 DIAGNOSIS — R4189 Other symptoms and signs involving cognitive functions and awareness: Secondary | ICD-10-CM

## 2024-04-30 DIAGNOSIS — R413 Other amnesia: Secondary | ICD-10-CM | POA: Diagnosis present

## 2024-04-30 NOTE — Progress Notes (Signed)
 "  Mental Status/Behavioral Observations (04/30/2024):  Orientation: The patient was oriented to self, place, and time. Sensory/Arousal: Hearing and vision were adequate for the testing. The patient was alert. Appearance: Dress and hygiene were appropriate for the setting.   Speech/Language: In conversation, the patient's speech was prosodic, fluent, and well-articulated. The patient displayed some indications of word finding difficulties. No word substitution errors were observed.  Motor: The patient ambulated with the assistance of a cane. No tremors were observed.  Social Comportment: Social behavior was appropriate to the setting. Mood/Affect: Mood was largely neutral. Affect was consistent with mood. There were some indications of anxiety throughout the testing session. Attention/Concentration: The patient appeared to maintain consistent engagement throughout the testing session. No frank attentional lapses were observed. Thought Process/Content: The patient's thought process was coherent, linear, goal directed. There were no indications of psychosis.  Additional Observations: The patient showed no difficulties with understanding task instructions. Some difficulties with frustration tolerance were noted, and the patient became slightly tearful when struggling to recall information. The patient occasionally made comments criticizing his performance on the test. There were some indications of restlessness throughout the assessment as evidenced by repeated leg bouncing and shifting in place. The patient reported moderate back pain during testing. Breaks were offered as needed. Neuropsychology Note Shawn Brooks completed 170 minutes of neuropsychological testing with technician, Josue Ned, BA, under the supervision of Evalene Riff, PsyD., Clinical Neuropsychologist. The patient did not appear overtly distressed by the testing session, per behavioral observation or via self-report to the  technician. Rest breaks were offered.   Clinical Decision Making: In considering the patient's current level of functioning, level of presumed impairment, nature of symptoms, emotional and behavioral responses during clinical interview, level of literacy, and observed level of motivation/effort, a battery of tests was selected by Dr. Riff during initial consultation on 04/16/2024. This was communicated to the technician. Communication between the neuropsychologist and technician was ongoing throughout the testing session and changes were made as deemed necessary based on patient performance on testing, technician observations and additional pertinent factors such as those listed above.  Tests Administered: Brief Visuospatial Memory Test-Revised (BVMT-R) [place]  Verbal Learning Test-Third Edition (CVLT-3) Controlled Oral Word Association Test (FAS & Animals) Delis-Kaplan Executive Function System (D-KEFS), select subtests Neuropsychological Assessment Battery (NAB), select subtests Repeatable Battery for the Assessment of Neuropsychological Status Update (RBANS) Rey Complex Figure Test (RCFT), select subtests Trail Making Test (TMT; Part A & B) Wechsler Adult Intelligence Scale-Fourth Edition (WAIS-IV), select subtests [place]  Multiphasic Personality Inventory-3 (MMPI-3)  Results: Note: This summary of test scores accompanies the interpretive report and should not be interpreted by unqualified individuals or in isolation without reference to the report. Test scores are relative to age, gender, and educational history as available and appropriate. Measurement properties of test scores: IQ, Index, and Standard Scores (SS): Mean = 100; Standard Deviation = 15; Scaled Scores (ss): Mean = 10; Standard Deviation = 3; Z scores (Z): Mean = 0; Standard Deviation = 1; T scores (T); Mean = 50; Standard Deviation = 10  ATTENTION AND WORKING MEMORY    Norm Score Percentile  Range  WAIS-IV          Digit  Span  ss = 7 16 %ile Low Average   DSF  ss = 5 5 %ile Below Average   Span:    4      DSB  ss = 9 37 %ile Average   Span:    4  DSS  ss = 9 37 %ile Average   Span:    5      Arithmetic  ss = 9 37 %ile Average   PROCESSING SPEED    Norm Score Percentile  Range  WAIS-IV          Coding  ss = 4 2 %ile Below Average   Symbol Search  ss = 7 16 %ile Low Average   LANGUAGE    Norm Score Percentile  Range  NAB Naming  t = 54 66 %ile Average  COWAT          FAS  t = 67 95 %ile Above Average   Animals  t = 56 73 %ile Average   EXECUTIVE FUNCTIONING    Norm Score Percentile  Range  DKEFS - Color-Word Interference          Color Naming  ss = 1 0.1 %ile Exceptionally Low   Word Reading  ss = 7 16 %ile Low Average   Inhibition  ss = 5 5 %ile Below Average   Errors  ss = 9 37 %ile Average   Inhibition Switching  ss = 7 16 %ile Low Average   Errors  ss = 11 63 %ile Average  Trails A  t = 52 58 %ile Average  Trails B  t = 49 45 %ile Average  WAIS- IV           Similarities  ss = 9 37 %ile Average   MEMORY    Norm Score Percentile  Range  BVMT-R          Trial 1  t = 40.0 16 %ile Low Average   Trial 2  t = 32 4 %ile Below Average   Trial 3  t = 35.0 7 %ile Below Average   Total Recall  t = 34.0 5 %ile Below Average   Learning  t = 46.0 32 %ile Average   Delayed Recall  t = 34.0 5 %ile Below Average   % Retained    86 11-16 %ile Low Average   Hits     11-16 %ile Low Average   False Alarms     3-5 %ile Below Average   Recognition Discriminability     3-5 %ile Below Average  CVLT-III          Trial 1  ss = 7.0 16 %ile Low Average   Trial 2  ss = 10.0 50 %ile Average   Trial 3  ss = 8.0 25 %ile Average   Trial 4  ss = 8.0 25 %ile Average   Trial 5  ss = 8.0 25 %ile Average   Trial B  ss = 13.0 84 %ile High Average   Short Delay Free Recall  ss = 5.0 5 %ile Below Average   Short Delay Cued Recall  ss = 8.0 25 %ile Average   Long Delay Free Recall  ss = 9.0 37 %ile Average   Long  Delay Cued Recall  ss = 9.0 37 %ile Average   Total Hits  ss = 7.0 16 %ile Low Average   Total False Positives  ss = 9.0 37 %ile Average   Recognition Discriminability  ss = 8.0 25 %ile Average   Total Intrusions  ss = 8.0 25 %ile Average   Trials 1-5 Total Correct  SS = 90 25 %ile Average   Total Repetitions  ss = 12.0 75 %ile High Average   List B vs. Trial  1  ss = 15.0 95 %ile Above Average   SD (FR) vs. Trial 5 Correct  ss = 5.0 5 %ile Below Average   LD (FR)vs. SD (FR)  ss = 16.0 98 %ile Exceptionally High  NAB Story          Story Learning Trial 1 Phrase Unit      22 %ile Low Average   Story Learning Trial 2 Phrase Unit      23 %ile Low Average             Story Learning Trial 1 Thematic Unit    25 %ile Average   Story Learning Trial 2 Thematic Unit     50 %ile Average             Story Learning Phrase Unit Immediate Recall  t = 43 24 %ile Average to Low Average   Story Learning Phrase Unit Delayed Recall t = 48 42 %ile Average                Story Learning Thematic Unit Immediate Recall     25 %ile Average   Story Learning Thematic Unit Delayed Recall    50 %ile Average             Story Learning Phrase Unit Percent Retention     50 %ile Average   VISUAL-SPATIAL    Norm Score Percentile  Range  Rey Complex Figure Copy       6 to 10  %ile Low to Below Average            RBANS Visuospatial Index          RBANS Line Orientation      26th-50th  %ile Average   PERSONALITY AND BEHAVIORAL FUNCTIONING      Score/Interpretation  MMPI -3           Feedback to Patient: Shawn Brooks will return on 05/10/2024 for an interactive feedback session with Dr. Hayden at which time his test performances, clinical impressions and treatment recommendations will be reviewed in detail. The patient understands he can contact our office should he require our assistance before this time.  170 minutes spent face-to-face with patient administering standardized tests, 70 minutes spent scoring  radiographer, therapeutic). [CPT [id number], [id number]]  Full report to follow. "

## 2024-05-01 ENCOUNTER — Ambulatory Visit: Payer: Self-pay | Admitting: Internal Medicine

## 2024-05-03 ENCOUNTER — Encounter (HOSPITAL_COMMUNITY): Payer: MEDICAID

## 2024-05-07 ENCOUNTER — Encounter: Payer: MEDICAID | Admitting: Psychology

## 2024-05-08 ENCOUNTER — Encounter: Payer: MEDICAID | Admitting: Psychology

## 2024-05-10 ENCOUNTER — Other Ambulatory Visit (HOSPITAL_COMMUNITY): Payer: Self-pay

## 2024-05-10 ENCOUNTER — Encounter (HOSPITAL_COMMUNITY): Payer: Self-pay

## 2024-05-10 ENCOUNTER — Ambulatory Visit (INDEPENDENT_AMBULATORY_CARE_PROVIDER_SITE_OTHER): Payer: MEDICAID

## 2024-05-10 ENCOUNTER — Encounter: Payer: MEDICAID | Admitting: Psychology

## 2024-05-10 VITALS — BP 115/83 | HR 94 | Ht 69.0 in | Wt 226.0 lb

## 2024-05-10 DIAGNOSIS — F4010 Social phobia, unspecified: Secondary | ICD-10-CM

## 2024-05-10 DIAGNOSIS — F3341 Major depressive disorder, recurrent, in partial remission: Secondary | ICD-10-CM

## 2024-05-10 MED ORDER — ARIPIPRAZOLE 2 MG PO TABS: 2.0000 mg | ORAL_TABLET | Freq: Every day | ORAL | 1 refills | Status: DC

## 2024-05-10 MED ORDER — FLUOXETINE HCL 40 MG PO CAPS: 80.0000 mg | ORAL_CAPSULE | Freq: Every day | ORAL | 0 refills | Status: DC

## 2024-05-10 MED ORDER — LAMOTRIGINE 25 MG PO TABS: 25.0000 mg | ORAL_TABLET | Freq: Every day | ORAL | 1 refills | Status: DC

## 2024-05-10 MED ORDER — TRAZODONE HCL 150 MG PO TABS: 150.0000 mg | ORAL_TABLET | Freq: Every day | ORAL | 1 refills | Status: DC

## 2024-05-10 MED ORDER — PROPRANOLOL HCL 10 MG PO TABS
10.0000 mg | ORAL_TABLET | Freq: Two times a day (BID) | ORAL | 1 refills | Status: DC | PRN
Start: 2024-05-10 — End: 2024-05-10

## 2024-05-11 ENCOUNTER — Encounter: Payer: Self-pay | Admitting: Internal Medicine

## 2024-05-15 ENCOUNTER — Encounter: Payer: MEDICAID | Admitting: Psychology

## 2024-06-01 ENCOUNTER — Encounter: Payer: MEDICAID | Attending: Psychology | Admitting: Psychology

## 2024-06-01 DIAGNOSIS — R413 Other amnesia: Secondary | ICD-10-CM | POA: Insufficient documentation

## 2024-06-01 DIAGNOSIS — R4189 Other symptoms and signs involving cognitive functions and awareness: Secondary | ICD-10-CM | POA: Insufficient documentation

## 2024-06-04 ENCOUNTER — Telehealth: Payer: Self-pay | Admitting: Internal Medicine

## 2024-06-04 ENCOUNTER — Ambulatory Visit: Payer: MEDICAID | Admitting: Internal Medicine

## 2024-06-04 NOTE — Telephone Encounter (Signed)
 Copied from CRM #[id number]. Topic: Appointments - Appointment Cancel/Reschedule >> Jun 04, 2024  8:18 AM Shawn Brooks wrote:  Pt needs appt rescheduled for today because his medicaid ride hasn't shown up please call and get this appt rescheduled.

## 2024-06-04 NOTE — Telephone Encounter (Signed)
 Contacted patient. Patient has been scheduled for the next available appointment and has confirmed and acknowledged of the appointment.

## 2024-06-07 ENCOUNTER — Telehealth: Payer: MEDICAID | Admitting: Physician Assistant

## 2024-06-07 ENCOUNTER — Ambulatory Visit (INDEPENDENT_AMBULATORY_CARE_PROVIDER_SITE_OTHER): Payer: MEDICAID

## 2024-06-07 DIAGNOSIS — F4010 Social phobia, unspecified: Secondary | ICD-10-CM

## 2024-06-07 DIAGNOSIS — F3341 Major depressive disorder, recurrent, in partial remission: Secondary | ICD-10-CM | POA: Diagnosis not present

## 2024-06-07 DIAGNOSIS — R12 Heartburn: Secondary | ICD-10-CM

## 2024-06-07 MED ORDER — TRAZODONE HCL 150 MG PO TABS: 150.0000 mg | ORAL_TABLET | Freq: Every day | ORAL | 1 refills | Status: AC

## 2024-06-07 MED ORDER — LAMOTRIGINE 25 MG PO TABS: 25.0000 mg | ORAL_TABLET | Freq: Every day | ORAL | 1 refills | Status: AC

## 2024-06-07 MED ORDER — ARIPIPRAZOLE 2 MG PO TABS: 2.0000 mg | ORAL_TABLET | Freq: Every day | ORAL | 1 refills | Status: AC

## 2024-06-07 MED ORDER — PROPRANOLOL HCL 10 MG PO TABS: 10.0000 mg | ORAL_TABLET | Freq: Two times a day (BID) | ORAL | 1 refills | Status: DC

## 2024-06-07 MED ORDER — PANTOPRAZOLE SODIUM 40 MG PO TBEC: 40.0000 mg | DELAYED_RELEASE_TABLET | Freq: Every day | ORAL | 0 refills | Status: AC

## 2024-06-07 MED ORDER — FLUOXETINE HCL 40 MG PO CAPS: 80.0000 mg | ORAL_CAPSULE | Freq: Every day | ORAL | 0 refills | Status: AC

## 2024-06-07 NOTE — Progress Notes (Signed)
 We are sorry that you are not feeling well.  Here is how we plan to help!  Based on what you shared with me it looks like you most likely have Gastroesophageal Reflux Disease (GERD)  Gastroesophageal reflux disease (GERD) happens when acid from your stomach flows up into the esophagus.  When acid comes in contact with the esophagus, the acid causes sorenss (inflammation) in the esophagus.  Over time, GERD may create small holes (ulcers) in the lining of the esophagus.  I have prescribed Pantoprazole  40mg  Take 1 tablet daily and follow up with your Primary Care Provider for longer term refills, if needed.  Your symptoms should improve in the next day or two.  You can use antacids as needed until symptoms resolve.  Call us  if your heartburn worsens, you have trouble swallowing, weight loss, spitting up blood or recurrent vomiting.  Home Care: May include lifestyle changes such as weight loss, quitting smoking and alcohol consumption Avoid foods and drinks that make your symptoms worse, such as: Caffeine or alcoholic drinks Chocolate Peppermint or mint flavorings Garlic and onions Spicy foods Citrus fruits, such as oranges, lemons, or limes Tomato-based foods such as sauce, chili, salsa and pizza Fried and fatty foods Avoid lying down for 3 hours prior to your bedtime or prior to taking a nap Eat small, frequent meals instead of a large meals Wear loose-fitting clothing.  Do not wear anything tight around your waist that causes pressure on your stomach. Raise the head of your bed 6 to 8 inches with wood blocks to help you sleep.  Extra pillows will not help.  Seek Help Right Away If: You have pain in your arms, neck, jaw, teeth or back Your pain increases or changes in intensity or duration You develop nausea, vomiting or sweating (diaphoresis) You develop shortness of breath or you faint Your vomit is green, yellow, black or looks like coffee grounds or blood Your stool is red, bloody  or black  These symptoms could be signs of other problems, such as heart disease, gastric bleeding or esophageal bleeding.  Make sure you : Understand these instructions. Will watch your condition. Will get help right away if you are not doing well or get worse.  Your e-visit answers were reviewed by a board certified advanced clinical practitioner to complete your personal care plan.  Depending on the condition, your plan could have included both over the counter or prescription medications.  If there is a problem please reply  once you have received a response from your provider.  Your safety is important to us .  If you have drug allergies check your prescription carefully.    You can use MyChart to ask questions about todays visit, request a non-urgent call back, or ask for a work or school excuse for 24 hours related to this e-Visit. If it has been greater than 24 hours you will need to follow up with your provider, or enter a new e-Visit to address those concerns.  You will get an e-mail in the next two days asking about your experience.  I hope that your e-visit has been valuable and will speed your recovery. Thank you for using e-visits.  I have spent 5 minutes in review of e-visit questionnaire, review and updating patient chart, medical decision making and response to patient.   Delon CHRISTELLA Dickinson, PA-C

## 2024-06-07 NOTE — Progress Notes (Signed)
 BH MD Outpatient Progress Note  06/07/2024 8:37 AM Shawn Brooks  MRN:  [id number]  Assessment:  Shawn Brooks presents for follow-up evaluation in-person.  His depression and anxiety have continued to improve since the previous visit, he has had no panic attacks.  Visual hallucinations have stayed at baseline without exacerbation, he has been taking Abilify  every day.  Smoking marijuana could be contributing to visual hallucinations however he is precontemplative to quit it currently.  He has been taking all his medications as prescribed, has had no side effects or any new physical concerns.  He is not actively passively homicidal or suicidal, has been eating well and has had adequate sleep.  Psychosocial stressors that include interpersonal conflicts with family, life changes in his daughter and friends have improved which have led to an improvement in his mood.  He has continued to smoke marijuana, drinks alcohol occasionally and has not been using any other substances.  He does have fair insight into his condition, and has demonstrated good coping strategies.  Therapy has been going well for him, he has a good therapeutic relationship that has also helped him with his stability.  Recommended continue seeking therapy for now.  Plan to continue him on the same medication management due to significant benefit.  Also encouraged him to establish a pain management clinic.  Will have him back in the clinic in 12 weeks virtually.  Risk Assessment: A suicide and violence risk assessment was performed as part of this evaluation. There patient is deemed to be at chronic elevated risk for self-harm/suicide given the following factors: suicidal ideation or threats without a plan, feelings of hopelessness, lack of social support, and current substance abuse. These risk factors are mitigated by the following factors: no known access to weapons or firearms, no history of violence, motivation for treatment,  utilization of positive coping skills, expresses purpose for living, current treatment compliance, effective problem solving skills, and safe housing. The patient is deemed to be at chronic elevated risk for violence given the following factors: homicidal ideation and current substance abuse. These risk factors are mitigated by the following factors: no known history of violence towards others, no known history of threats of harm towards others, and low impulsivity. There is no acute risk for suicide or violence at this time. The patient was educated about relevant modifiable risk factors including following recommendations for treatment of psychiatric illness and abstaining from substance abuse.  While future psychiatric events cannot be accurately predicted, the patient does not currently require  acute inpatient psychiatric care and does not currently meet [place]  involuntary commitment criteria.    Identifying Information: Shawn Brooks is a 48 y.o. male with historical diagnosis of MDD, social anxiety disorder, HIV on HAART threapy, HLD, and Vitamin D  deficiency who is an established patient with [facility] for medication management. He reports progressively worsening memory problems and chronically struggles with insomnia. Memory problems currently hypothesized secondary to both insufficient treatment of MDD and possibly cannabis use although uncertain if more is contributing as his memory problems do not correlate with worsening depression. Sleep study did not show signs of OSA. Diagnosis of MDD made based on patient's depressed mood, anhedonia, poor sleep, poor appetite, inattention. MOCA performed 06/02/23 was 27/30.  Plan:  # Major depressive disorder-recurrent episode, moderate with psychotic features Past medication trials: Zoloft  Status of problem: stable Interventions: -- Continue Prozac  80 mg daily -- Patient currently stable on Lamictal  25 mg daily --  Continue Abilify  2 mg daily for [facility]   #Social Anxiety Disorder -- Unclear etiology. 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 -- Continue propranolol  10 mg twice daily   # Hypersomnia Past medication trials:  Status of problem: stable Interventions: -- Continue trazodone  150 mg nightly -- sleep study was inconclusive the last time  REM Sleep Behavior Disorder (resolved) -increase melatonin to 10 mg at bedtime- doesn't work, she does not taking it -referral sleep to sleep medicine  Cannabis Use Disorder Substance induced mood disorder -advised decrease use, reported use last week, patient has had increased use, 2 g every day   # EtOH use disorder, in early remission Past medication trials: none Status of problem: improving Interventions: -- 3-4 beers per month -- Psychoeducation provided regarding risks of dependency, tolerance, and withdrawal as well as safe limits of use; patient expresses intent to continue to reduce use  # Memory problems -Unclear etiology but differential includes chronic cannabis use, pseudodementia 2/2 MDD, wernicke's encepahlopathy versus HIV dementia versus iatrogenic   Folate deficiency Vitamin D  deficiency -Vitamin D  supplement -Multivitamin with folate  Patient was given contact information for behavioral health clinic and was instructed to call 911 for emergencies.   Subjective:  Chief Complaint:  Medication management  Interval History:  Today, patient presented to the clinic unaccompanied.  He reported his mood as phenomenal.  He denied any active or passive SI/HI/AH but endorsed visual hallucinations bugs , reported that they have been consistently present.  Reported that things with his daughter have been great and my friends better .  Reported that his anxiety is around a lot of people , but reported that propranolol  helps with his anxiety.  He denied any panic attacks.  He reported good sleep overall, for the most part yes ,  reported that at times he takes daytime naps.  Reported feeling refreshed and energetic. He reported that he has been doing therapy with good therapeutic relationship, stated that therapy is great . Continues to report that he has been smoking marijuana, about 1 g every day, drinks alcohol occasionally, denied using any other substances.  Reported that he was previously at the pain medicine clinic but due to the false positive results of methamphetamine in the urine he was not seen in the clinic.  Currently he is looking for other pain management clinics, waiting to hear back.  We discussed about continuing the same medications for now, he requested a virtual visit, plan to have him back in the clinic in 12 weeks.  Visit Diagnosis:    ICD-10-CM   1. Recurrent major depressive disorder, in partial remission  F33.41 traZODone  (DESYREL ) 150 MG tablet    FLUoxetine  (PROZAC ) 40 MG capsule    lamoTRIgine  (LAMICTAL ) 25 MG tablet    ARIPiprazole  (ABILIFY ) 2 MG tablet    propranolol  (INDERAL ) 10 MG tablet    2. Social anxiety disorder  F40.10 propranolol  (INDERAL ) 10 MG tablet         Past Psychiatric History:  Diagnoses: Historical diagnosis of bipolar 1 disorder Medication trials: Zoloft  Substance use:             -- Etoh: Used to have 6 beers daily; denies history of withdrawal; now on 1 beer every ~2 weeks             -- Cannabis: Daily multiple blunts             -- Denies use of illicit drugs  Past Medical History:  Past Medical History:  Diagnosis Date   Anal condyloma    Anxiety    Blood dyscrasia    HIV   Chronic back pain    Constipation    takes miralax  daily   Depression    on meds   DJD (degenerative joint disease)    Gastric ulcer    GERD (gastroesophageal reflux disease)    Headache(784.0)    Hiatal hernia    HIV infection (HCC)    on meds   Hyperplastic colon polyp    Hypertension    on meds   Internal hemorrhoids    Sickle cell anemia (HCC)     Past  Surgical History:  Procedure Laterality Date   COLONOSCOPY  2019   JMP-MAC-prep good-polyps+   ESOPHAGOGASTRODUODENOSCOPY  03/27/2012   Procedure: ESOPHAGOGASTRODUODENOSCOPY (EGD);  Surgeon: Jerrell KYM Sol, MD;  Location: [facility];  Service: Endoscopy;  Laterality: N/A;   INCISION AND DRAINAGE PERIRECTAL ABSCESS N/A 06/25/2023   Procedure: IRRIGATION AND DEBRIDEMENT PERIRECTAL ABSCESS;  Surgeon: Paola Dreama SAILOR, MD;  Location: [facility];  Service: General;  Laterality: N/A;   IRRIGATION AND DEBRIDEMENT ABSCESS N/A 03/13/2015   Procedure: IRRIGATION AND DEBRIDEMENT ABSCESS;  Surgeon: Donnice Lunger, MD;  Location: [facility];  Service: General;  Laterality: N/A;   WISDOM TOOTH EXTRACTION      Family History:  Family History  Problem Relation Age of Onset   Drug abuse Mother    Alcohol abuse Mother    Esophageal cancer Mother 22       smoker/used ETOH   Alcohol abuse Father    Drug abuse Father    Pneumonia Father    Diabetes Father    Hypertension Sister    Crohn's disease Brother    Colon cancer Maternal Grandfather    Crohn's disease Maternal Grandmother    Cancer Maternal Grandmother        patient unsure of type   Colon polyps Neg Hx    Rectal cancer Neg Hx    Stomach cancer Neg Hx     Social History:  Social History   Socioeconomic History   Marital status: Divorced    Spouse name: Not on file   Number of children: 1   Years of education: Not on file   Highest education level: Some college, no degree  Occupational History   Occupation: Disabled  Tobacco Use   Smoking status: Every Day    Current packs/day: 0.50    Average packs/day: 0.5 packs/day for 34.9 years (17.5 ttl pk-yrs)    Types: Cigarettes    Start date: 1991   Smokeless tobacco: Never   Tobacco comments:    not ready to quit.  Vaping Use   Vaping status: Never Used  Substance and Sexual Activity   Alcohol use: Yes    Comment: socially   Drug use: Yes    Frequency: 7.0 times per week     Types: Marijuana   Sexual activity: Not Currently    Partners: Male    Comment: declined condoms  Other Topics Concern   Not on file  Social History Narrative   Not on file   Social Drivers of Health   Tobacco Use: High Risk (05/10/2024)   Patient History    Smoking Tobacco Use: Every Day    Smokeless Tobacco Use: Never    Passive Exposure: Not on file  Financial Resource Strain: High Risk (05/29/2024)   Overall Financial Resource Strain (CARDIA)    Difficulty of Paying Living Expenses: Hard  Food Insecurity:  Food Insecurity Present (05/29/2024)   Epic    Worried About Programme Researcher, Broadcasting/film/video in the Last Year: Often true    Ran Out of Food in the Last Year: Sometimes true  Transportation Needs: Unmet Transportation Needs (05/29/2024)   Epic    Lack of Transportation (Medical): Yes    Lack of Transportation (Non-Medical): Yes  Physical Activity: Inactive (05/29/2024)   Exercise Vital Sign    Days of Exercise per Week: 0 days    Minutes of Exercise per Session: Not on file  Stress: Stress Concern Present (05/29/2024)   [employer] of Occupational Health - Occupational Stress Questionnaire    Feeling of Stress: To some extent  Social Connections: Moderately Isolated (05/29/2024)   Social Connection and Isolation Panel    Frequency of Communication with Friends and Family: More than three times a week    Frequency of Social Gatherings with Friends and Family: Once a week    Attends Religious Services: Never    Database Administrator or Organizations: No    Attends Engineer, Structural: Not on file    Marital Status: Living with partner  Depression (PHQ2-9): Low Risk (05/10/2024)   Depression (PHQ2-9)    PHQ-2 Score: 2  Recent Concern: Depression (PHQ2-9) - Medium Risk (03/08/2024)   Depression (PHQ2-9)    PHQ-2 Score: 6  Alcohol Screen: Low Risk (05/29/2024)   Alcohol Screen    Last Alcohol Screening Score (AUDIT): 2  Housing: High Risk (05/29/2024)   Epic    Unable  to Pay for Housing in the Last Year: Yes    Number of Times Moved in the Last Year: 1    Homeless in the Last Year: No  Utilities: Not At Risk (09/07/2023)   AHC Utilities    Threatened with loss of utilities: No  Health Literacy: Adequate Health Literacy (08/30/2023)   B1300 Health Literacy    Frequency of need for help with medical instructions: Rarely    Allergies:  Allergies  Allergen Reactions   Chantix  [Varenicline ] Other (See Comments)    Bad dreams   Oxycodone  Itching    Current Medications: Current Outpatient Medications  Medication Sig Dispense Refill   acetaminophen  (TYLENOL  8 HOUR) 650 MG CR tablet Take 1 tablet (650 mg total) by mouth every 8 (eight) hours as needed for pain. 30 tablet 1   albuterol  (VENTOLIN  HFA) 108 (90 Base) MCG/ACT inhaler Inhale 2 puffs into the lungs every 6 (six) hours as needed for wheezing or shortness of breath. 18 g 1   AMBULATORY NON FORMULARY MEDICATION Medication Name: Diltiazem  2% gel:Lidocaine  5% - using your index finger, you should apply a small amount of medication inside the rectum up to your first knuckle/joint three times daily x 6-8 weeks. 45 g 1   ARIPiprazole  (ABILIFY ) 2 MG tablet Take 1 tablet (2 mg total) by mouth daily. 90 tablet 1   aspirin  EC 81 MG tablet Take 1 tablet (81 mg total) by mouth daily. Swallow whole. 100 tablet 1   bictegravir-emtricitabine -tenofovir  AF (BIKTARVY) 50-200-25 MG TABS tablet Take 1 tablet by mouth daily. 30 tablet 11   Eluxadoline  (VIBERZI ) 100 MG TABS TAKE 1 TABLET BY MOUTH 1-2 TIMES DAILY 180 tablet 0   ezetimibe  (ZETIA ) 10 MG tablet Take 1 tablet (10 mg total) by mouth daily. 90 tablet 3   FLUoxetine  (PROZAC ) 40 MG capsule Take 2 capsules (80 mg total) by mouth daily. 180 capsule 0   lamoTRIgine  (LAMICTAL ) 25 MG tablet Take 1  tablet (25 mg total) by mouth daily. 90 tablet 1   methocarbamol  (ROBAXIN ) 500 MG tablet Take 1 tablet (500 mg total) by mouth 3 (three) times daily. 90 tablet 0   nicotine   (NICODERM CQ  - DOSED IN MG/24 HOURS) 21 mg/24hr patch Place 1 patch (21 mg total) onto the skin daily. 28 patch 1   propranolol  (INDERAL ) 10 MG tablet Take 1 tablet (10 mg total) by mouth 2 (two) times daily. 180 tablet 1   rosuvastatin  (CRESTOR ) 10 MG tablet Take 1 tablet (10 mg total) by mouth daily. 90 tablet 3   solifenacin  (VESICARE ) 5 MG tablet Take 1 tablet (5 mg total) by mouth daily. (Patient not taking: Reported on 02/15/2024) 30 tablet 11   tadalafil  (CIALIS ) 20 MG tablet Take 1 tablet (20 mg total) by mouth daily as needed for erectile dysfunction. 20 tablet 11   traMADol  (ULTRAM ) 50 MG tablet Take 1 tablet (50 mg total) by mouth every 12 (twelve) hours as needed. 15 tablet 0   traZODone  (DESYREL ) 150 MG tablet Take 1 tablet (150 mg total) by mouth at bedtime. 90 tablet 1   valACYclovir  (VALTREX ) 500 MG tablet TAKE 1 TABLET BY MOUTH TWICE DAILY 14 tablet 5   No current facility-administered medications for this visit.    ROS: Review of Systems  Constitutional:  Negative for activity change, appetite change, chills, diaphoresis and fatigue.  HENT:  Negative for congestion, dental problem, drooling, ear discharge and ear pain.   Eyes:  Negative for pain, discharge and itching.  Respiratory:  Negative for apnea, cough, choking and chest tightness.   Cardiovascular:  Negative for chest pain, palpitations and leg swelling.  Gastrointestinal:  Negative for abdominal distention, abdominal pain, constipation, diarrhea and nausea.  Endocrine: Negative for cold intolerance and heat intolerance.  Genitourinary:  Negative for difficulty urinating, dysuria, flank pain, frequency, hematuria and urgency.  Musculoskeletal:  Negative for arthralgias, back pain, gait problem, joint swelling, myalgias and neck pain.  Skin:  Negative for color change and pallor.  Allergic/Immunologic: Negative for environmental allergies and food allergies.  Neurological:  Negative for dizziness, seizures, syncope,  facial asymmetry, speech difficulty, light-headedness, numbness and headaches.  Psychiatric/Behavioral:  Negative for agitation, behavioral problems, confusion, decreased concentration, dysphoric mood, hallucinations, self-injury, sleep disturbance and suicidal ideas. The patient is not nervous/anxious and is not hyperactive.     Objective:  Psychiatric Specialty Exam: Blood pressure 118/85, pulse 86, height 5' 9 (1.753 m), weight 226 lb (102.5 kg).Body mass index is 33.37 kg/m.  General Appearance: Casual  Eye Contact:  Good  Speech:  Clear and Coherent and Normal Rate  Volume:  Normal  Mood:  Depressed  Affect:  Depressed and Tearful  Thought Process:  Coherent, Goal Directed, and Linear  Orientation:  Full (Time, Place, and Person)  Thought Content: Logical   Suicidal Thoughts:  No  Homicidal Thoughts:  No  Memory:  Remote;   Good  Judgment:  Fair  Insight:  Fair  Psychomotor Activity:  Normal  Concentration:  Concentration: Good and Attention Span: Good              Assets:  Communication Skills Desire for Improvement Financial Resources/Insurance Housing Intimacy Leisure Time Physical Health Resilience Social Support Talents/Skills Transportation Vocational/Educational  ADL's:  Intact  Cognition: WNL  Sleep:  Good   PE: General: well-appearing; no acute distress  Pulm: no increased work of breathing on room air  Strength & Muscle Tone: within normal limits Neuro: no focal neurological deficits  observed  Gait & Station: normal  Metabolic Disorder Labs: Lab Results  Component Value Date   HGBA1C 5.7 04/08/2023   MPG 117 (H) 11/16/2013   No results found for: PROLACTIN Lab Results  Component Value Date   CHOL 168 04/04/2024   TRIG 88 04/04/2024   HDL 55 04/04/2024   CHOLHDL 3.1 04/04/2024   VLDL 28 05/12/2016   LDLCALC 97 04/04/2024   LDLCALC 164 (H) 02/22/2024   Lab Results  Component Value Date   TSH 2.370 03/18/2023   TSH 1.150  09/01/2017    Therapeutic Level Labs: No results found for: LITHIUM No results found for: VALPROATE No results found for: CBMZ  Screenings: GAD-7    Flowsheet Row Office Visit from 10/12/2023 in [facility] - A Dept Of [place]. [facility] Office Visit from 08/30/2023 in [facility] [place] - A Dept Of [place] [facility]. [facility] Office Visit from 04/08/2023 in [facility] [place] - A Dept Of [place]. [facility] Office Visit from 10/07/2022 in [facility] [place] - A Dept Of [place] [facility]. [facility] Video Visit from 03/18/2022 in [facility]  Total GAD-7 Score 19 7 15  0 16   Mini-Mental    Flowsheet Row Office Visit from 01/05/2024 in [facility]  Total Score (max 30 points ) 28   PHQ2-9    Flowsheet Row Clinical Support from 05/10/2024 in [facility] Clinical Support from 03/08/2024 in [facility] Clinical Support from 01/26/2024 in [facility] Office Visit from 01/16/2024 in [facility] [place] - A Dept Of [place]. [facility] Counselor from 10/26/2023 in [facility]  PHQ-2 Total Score 2 2 5 6 6   PHQ-9 Total Score -- 6 15 16 21    Flowsheet Row Counselor from 10/26/2023 in [facility] Counselor from 10/19/2023 in [facility] ED to Hosp-Admission (Discharged) from 06/23/2023 in [facility]  SURGICAL  C-SSRS RISK CATEGORY Moderate Risk Moderate Risk No Risk    Collaboration of Care: Collaboration of Care: Dr. Susen  Patient/Guardian was advised Release of Information must be obtained prior to any record release in order to collaborate their care with an outside provider. Patient/Guardian was advised if they have not already  done so to contact the registration department to sign all necessary forms in order for us  to release information regarding their care.   Consent: Patient/Guardian gives verbal consent for treatment and assignment of benefits for services provided during this visit. Patient/Guardian expressed understanding and agreed to proceed.   Inri Sobieski, MD 06/07/2024, 8:37 AM

## 2024-06-13 ENCOUNTER — Ambulatory Visit: Payer: MEDICAID

## 2024-06-29 ENCOUNTER — Other Ambulatory Visit: Payer: Self-pay | Admitting: Internal Medicine

## 2024-06-29 ENCOUNTER — Ambulatory Visit: Payer: MEDICAID | Admitting: Internal Medicine

## 2024-07-03 ENCOUNTER — Ambulatory Visit: Payer: MEDICAID

## 2024-07-03 ENCOUNTER — Ambulatory Visit: Payer: MEDICAID | Admitting: Internal Medicine

## 2024-07-03 ENCOUNTER — Other Ambulatory Visit: Payer: Self-pay

## 2024-07-03 ENCOUNTER — Encounter: Payer: Self-pay | Admitting: Neurology

## 2024-07-04 ENCOUNTER — Encounter: Payer: Self-pay | Admitting: Neurology

## 2024-07-05 ENCOUNTER — Encounter: Payer: Self-pay | Admitting: Internal Medicine

## 2024-07-05 ENCOUNTER — Ambulatory Visit: Payer: MEDICAID | Attending: Internal Medicine | Admitting: Internal Medicine

## 2024-07-05 VITALS — BP 122/75 | HR 68 | Temp 98.5°F | Ht 69.0 in | Wt 228.0 lb

## 2024-07-05 DIAGNOSIS — R002 Palpitations: Secondary | ICD-10-CM | POA: Diagnosis not present

## 2024-07-05 DIAGNOSIS — F410 Panic disorder [episodic paroxysmal anxiety] without agoraphobia: Secondary | ICD-10-CM | POA: Diagnosis not present

## 2024-07-05 DIAGNOSIS — Z2821 Immunization not carried out because of patient refusal: Secondary | ICD-10-CM

## 2024-07-05 DIAGNOSIS — D229 Melanocytic nevi, unspecified: Secondary | ICD-10-CM | POA: Diagnosis not present

## 2024-07-05 DIAGNOSIS — F17200 Nicotine dependence, unspecified, uncomplicated: Secondary | ICD-10-CM | POA: Diagnosis not present

## 2024-07-05 DIAGNOSIS — L309 Dermatitis, unspecified: Secondary | ICD-10-CM | POA: Diagnosis not present

## 2024-07-05 DIAGNOSIS — Z122 Encounter for screening for malignant neoplasm of respiratory organs: Secondary | ICD-10-CM

## 2024-07-05 DIAGNOSIS — R0789 Other chest pain: Secondary | ICD-10-CM

## 2024-07-05 DIAGNOSIS — R7303 Prediabetes: Secondary | ICD-10-CM | POA: Diagnosis not present

## 2024-07-05 LAB — POCT GLYCOSYLATED HEMOGLOBIN (HGB A1C): HbA1c, POC (prediabetic range): 5.9 % (ref 5.7–6.4)

## 2024-07-05 LAB — GLUCOSE, POCT (MANUAL RESULT ENTRY): POC Glucose: 107 mg/dL — AB (ref 70–99)

## 2024-07-05 NOTE — Patient Instructions (Signed)
" °  VISIT SUMMARY: Today, you were seen for recent episodes of panic attacks and chest pain. We discussed your history of hypertension, prediabetes, and smoking, as well as new skin changes on your left ankle, right heel, and face.  YOUR PLAN: -PANIC DISORDER: Panic disorder involves sudden episodes of intense fear or discomfort. You experienced recent panic attacks with dizziness, lightheadedness, heart palpitations, and difficulty breathing. We checked your thyroid levels to rule out hyperthyroidism and referred you to cardiology for further evaluation of your chest pain. Continue taking propranolol  as needed for panic attacks.  -ELEVATED BLOOD PRESSURE: Your blood pressure was slightly elevated at 133/71 mmHg. Continue following a low salt diet, and we rechecked your blood pressure during the visit.  -PREDIABETES: Prediabetes means your blood sugar levels are higher than normal but not high enough to be classified as diabetes. Your A1c is 5.9%. Continue your current dietary habits to manage your blood sugar levels.  -NICOTINE  DEPENDENCE: Nicotine  dependence is an addiction to tobacco products. You have a long-term smoking history and currently smoke 10-12 cigarettes per day. We ordered a CT scan of your chest for lung cancer screening.  -DERMATITIS OF FOOT AND SKIN LESIONS: Dermatitis refers to inflammation of the skin. You have skin lesions on your left ankle and right heel, as well as a facial lesion that has increased in size. We referred you to a dermatologist for evaluation and possible biopsy of these skin lesions.  -GENERAL HEALTH MAINTENANCE: You declined the flu vaccine. Continue with your routine health maintenance.  INSTRUCTIONS: Follow up with cardiology for chest pain evaluation and with dermatology for skin lesion evaluation. Continue taking propranolol  as needed for panic attacks, maintain your low salt diet, and adhere to your current dietary habits to manage blood sugar levels.  Complete the CT scan of your chest for lung cancer screening.                      Contains text generated by [employer]bridge.                                 Contains text generated by Abridge.   "

## 2024-07-05 NOTE — Progress Notes (Signed)
 "   Patient ID: Shawn Brooks, male    DOB: 1976/04/08  MRN: [id number]  CC: Follow-up (Follow-up & pre-diabetes./2 sharp pains on LT side of chest - panic attacks occurred at separate times/Spots on bilateral ankles X6 mo/No to flu vax)   Subjective: Shawn Brooks is a 49 y.o. male who presents for chronic ds management. His chronic medical issues include:  HIV, DJD,  hyperlipidemia, anxiety, bipolar 1  (followed by psychiatry ), tobacco dependence, vit D deficiency, numbness in hands (neg EMG 2019), chronic bilateral lower back pain , rectal fissure, internal hemorrhoids, PreDM   Discussed the use of AI scribe software for clinical note transcription with the patient, who gave verbal consent to proceed.  History of Present Illness Shawn Brooks is a 49 year old male with hypertension and HL who presents for chronic ds management.  He has experienced panic attacks for several years and takes propranolol , which he finds helpful in reducing his severity. Yesterday he had two panic attacks in one day after a period of five to six months without any episodes. The first attack occurred at home without any stressors, presenting with dizziness, lightheadedness, heart palpitations, and difficulty breathing. Propranolol  took about 15-20 minutes to alleviate the symptoms. The second attack was milder and also occurred at home while he was with friends, and propranolol  again helped alleviate the symptoms.  Following the panic attacks, he experienced right-sided chest pain described as a wide pressure sensation without radiation to the arm or neck. The first episode of chest pain lasted about 15-20 minutes, and the second lasted 5-10 minutes. Chest pain is not a usual symptom during his panic attacks. Denies and recent long distance travel.  Eval by cardiology less than 1 yr ago for DOE/CP. Echo was good. Coronary CT showed Mild nonobstructive coronary artery disease with minimal plaque burden. Placed on  Crestor  10 mg daily which he continues to take.  He continues to be followed by behavioral health.  He is on Abilify , lamotrigine  and Trazodone   Had elevated blood pressure with me on last visit.  DASH diet was advised.  He subsequently saw the clinical pharmacist in several weeks for recheck of blood pressure had normalized.    Tob dep: He has a significant smoking history, having smoked for 34 years, at most he was 3/4 pk/day smoker for yrs but currently smoking about 10-12 cigarettes a day, and has attempted to quit twice since his last visit. He acknowledges the difficulty in quitting but has reduced his consumption.  PreDM/Obesity: Results for orders placed or performed in visit on 07/05/24  POCT glycosylated hemoglobin (Hb A1C)   Collection Time: 07/05/24 12:11 PM  Result Value Ref Range   Hemoglobin A1C     HbA1c POC (<> result, manual entry)     HbA1c, POC (prediabetic range) 5.9 5.7 - 6.4 %   HbA1c, POC (controlled diabetic range)    POCT glucose (manual entry)   Collection Time: 07/05/24 12:11 PM  Result Value Ref Range   POC Glucose 107 (A) 70 - 99 mg/dl   *Note: Due to a large number of results and/or encounters for the requested time period, some results have not been displayed. A complete set of results can be found in Results Review.  He also has a history of prediabetes with an A1c of 5.9. He reports eating once or twice a day, avoiding sugary snacks and drinks, and using an air fryer to avoid fried foods.  He has noticed skin  changes on his left ankle and right heel, described as non-painful, non-itchy spots that have been present for several months. He also has a spot on his face that has increased in size over the years that looks like a mole.    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   Perianal abscess 06/24/2023   Acute urinary retention 06/24/2023   AKI (acute kidney injury) 06/24/2023   Anal fissure 06/17/2023   Folate deficiency 04/08/2023   Obstructive sleep apnea 03/10/2023   Severe episode of recurrent major depressive disorder, with psychotic features (HCC) 02/24/2023   Social anxiety disorder 02/24/2023   Class 2 severe obesity due to excess calories with serious comorbidity and body mass index (BMI) of 35.0 to 35.9 in adult 10/07/2022   Influenza vaccine refused 06/17/2020   Esophageal dysphagia 06/17/2020   Mixed hyperlipidemia 06/17/2020   Obesity, Class I, BMI 30-34.9 06/17/2020   History of farsightedness 06/17/2020   Chronic night sweats 04/10/2020   Counseled about COVID-19 virus infection 09/18/2019   Urinary frequency 12/26/2018   Medication monitoring encounter 08/08/2018   Memory problem 09/01/2017   Vitamin D  deficiency 09/01/2017   Primary osteoarthritis of left knee 05/02/2017   Prediabetes 12/22/2016   Tobacco abuse 05/31/2016   Lumbar transverse process fracture (HCC) 10/02/2015   Screening examination for sexually transmitted disease 04/04/2014   Thoracic or lumbosacral neuritis or radiculitis 01/15/2013   H/O gastrointestinal disease 01/15/2013   Lumbar radiculopathy 01/15/2013   Carcinoma in situ of anal canal 11/17/2012   Condyloma acuminatum 11/17/2012   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   AGW (anogenital warts) 11/17/2012   Hemorrhoid 09/27/2012   BRBPR (bright red blood per rectum) 07/27/2012   Prostatitis 04/12/2012   Constipation 03/09/2012   Callus 08/16/2011   Ingrown toenail 08/16/2011   Chronic low back pain 08/16/2011   Dyslipidemia 09/03/2010   Herpes simplex virus (HSV) infection 07/06/2010   Organic impotence 07/06/2010   BURSITIS, KNEE 05/06/2010   Human immunodeficiency virus (HIV) disease (HCC) 04/16/2010     Medications Ordered  Prior to Encounter[1]  Allergies[2]  Social History   Socioeconomic History   Marital status: Divorced    Spouse name: Not on file   Number of children: 1   Years of education: Not on file   Highest education level: Some college, no degree  Occupational History   Occupation: Disabled  Tobacco Use   Smoking status: Every Day    Current packs/day: 0.50    Average packs/day: 0.5 packs/day for 35.0 years (17.5 ttl pk-yrs)    Types: Cigarettes    Start date: 1991   Smokeless tobacco: Never   Tobacco comments:    not ready to quit.  Vaping Use   Vaping status: Never Used  Substance and Sexual Activity   Alcohol use: Yes    Comment: socially   Drug use: Yes    Frequency: 7.0 times per week    Types: Marijuana   Sexual activity: Not Currently    Partners: Male    Comment: declined condoms  Other Topics Concern   Not on file  Social History Narrative   Not on file   Social Drivers of Health   Tobacco Use: High Risk (07/05/2024)   Patient History  Smoking Tobacco Use: Every Day    Smokeless Tobacco Use: Never    Passive Exposure: Not on file  Financial Resource Strain: High Risk (06/25/2024)   Overall Financial Resource Strain (CARDIA)    Difficulty of Paying Living Expenses: Hard  Food Insecurity: Food Insecurity Present (06/25/2024)   Epic    Worried About Programme Researcher, Broadcasting/film/video in the Last Year: Often true    Ran Out of Food in the Last Year: Sometimes true  Transportation Needs: Unmet Transportation Needs (06/25/2024)   Epic    Lack of Transportation (Medical): Yes    Lack of Transportation (Non-Medical): Yes  Physical Activity: Inactive (06/25/2024)   Exercise Vital Sign    Days of Exercise per Week: 0 days    Minutes of Exercise per Session: Not on file  Stress: Stress Concern Present (06/25/2024)   [employer] of Occupational Health - Occupational Stress Questionnaire    Feeling of Stress: To some extent  Social Connections: Moderately Isolated  (06/25/2024)   Social Connection and Isolation Panel    Frequency of Communication with Friends and Family: More than three times a week    Frequency of Social Gatherings with Friends and Family: Once a week    Attends Religious Services: Never    Database Administrator or Organizations: No    Attends Engineer, Structural: Not on file    Marital Status: Living with partner  Intimate Partner Violence: Not At Risk (09/07/2023)   Humiliation, Afraid, Rape, and Kick questionnaire    Fear of Current or Ex-Partner: No    Emotionally Abused: No    Physically Abused: No    Sexually Abused: No  Depression (PHQ2-9): High Risk (07/05/2024)   Depression (PHQ2-9)    PHQ-2 Score: 16  Alcohol Screen: Low Risk (06/25/2024)   Alcohol Screen    Last Alcohol Screening Score (AUDIT): 2  Housing: High Risk (06/25/2024)   Epic    Unable to Pay for Housing in the Last Year: Yes    Number of Times Moved in the Last Year: 1    Homeless in the Last Year: No  Utilities: Not At Risk (09/07/2023)   AHC Utilities    Threatened with loss of utilities: No  Health Literacy: Adequate Health Literacy (08/30/2023)   B1300 Health Literacy    Frequency of need for help with medical instructions: Rarely    Family History  Problem Relation Age of Onset   Drug abuse Mother    Alcohol abuse Mother    Esophageal cancer Mother 40       smoker/used ETOH   Alcohol abuse Father    Drug abuse Father    Pneumonia Father    Diabetes Father    Hypertension Sister    Crohn's disease Brother    Colon cancer Maternal Grandfather    Crohn's disease Maternal Grandmother    Cancer Maternal Grandmother        patient unsure of type   Colon polyps Neg Hx    Rectal cancer Neg Hx    Stomach cancer Neg Hx     Past Surgical History:  Procedure Laterality Date   COLONOSCOPY  2019   JMP-MAC-prep good-polyps+   ESOPHAGOGASTRODUODENOSCOPY  03/27/2012   Procedure: ESOPHAGOGASTRODUODENOSCOPY (EGD);  Surgeon: Jerrell KYM Sol, MD;  Location: [facility];  Service: Endoscopy;  Laterality: N/A;   INCISION AND DRAINAGE PERIRECTAL ABSCESS N/A 06/25/2023   Procedure: IRRIGATION AND DEBRIDEMENT PERIRECTAL ABSCESS;  Surgeon: Paola Dreama SAILOR, MD;  Location: [facility];  Service: General;  Laterality: N/A;   IRRIGATION AND DEBRIDEMENT ABSCESS N/A 03/13/2015   Procedure: IRRIGATION AND DEBRIDEMENT ABSCESS;  Surgeon: Donnice Lunger, MD;  Location: [facility];  Service: General;  Laterality: N/A;   WISDOM TOOTH EXTRACTION      ROS: Review of Systems Negative except as stated above  PHYSICAL EXAM: BP 122/75   Pulse 68   Temp 98.5 F (36.9 C) (Oral)   Ht 5' 9 (1.753 m)   Wt 228 lb (103.4 kg)   SpO2 99%   BMI 33.67 kg/m   Wt Readings from Last 3 Encounters:  07/05/24 228 lb (103.4 kg)  06/07/24 226 lb (102.5 kg)  05/10/24 226 lb (102.5 kg)    Physical Exam General appearance - alert, well appearing, older AAM and in no distress Mental status - normal mood, behavior, speech, dress, motor activity, and thought processes Neck - supple, no significant adenopathy Chest - clear to auscultation, no wheezes, rales or rhonchi, symmetric air entry Heart - normal rate, regular rhythm, normal S1, S2, no murmurs, rubs, clicks or gallops Extremities - peripheral pulses normal, no pedal edema, no clubbing or cyanosis. Pt has cane with him Skin - RT cheek - hyperpigmented, raised lesion measuring 1x0.5 cm in size. See picture below. Feet: pt has several raised hypo and hyperpigmented spots over LT ankle laterally and some RT ankle laterally. See pic below          07/05/2024   12:41 PM 05/10/2024   10:08 AM 03/08/2024   10:50 AM  Depression screen PHQ 2/9  Decreased Interest 1 1 1   Down, Depressed, Hopeless 2 1 1   PHQ - 2 Score 3 2 2   Altered sleeping 2  1  Tired, decreased energy 2  1  Change in appetite 1  0  Feeling bad or failure about yourself  3  1  Trouble concentrating 3  1  Moving slowly or  fidgety/restless 1  0  Suicidal thoughts 1    PHQ-9 Score 16  6   Difficult doing work/chores Very difficult       Data saved with a previous flowsheet row definition      07/05/2024   12:40 PM 01/16/2024    3:57 PM 10/12/2023   10:07 AM 08/30/2023   10:05 AM  GAD 7 : Generalized Anxiety Score  Nervous, Anxious, on Edge 1 3 3 1   Control/stop worrying 2 3 3 1   Worry too much - different things 2 3 3 1   Trouble relaxing 2 3 2 1   Restless 1 3 2 1   Easily annoyed or irritable 3 3 3 1   Afraid - awful might happen 3  3 1   Total GAD 7 Score 14  19 7   Anxiety Difficulty Very difficult  Extremely difficult Extremely difficult        Latest Ref Rng & Units 04/27/2024    2:23 PM 04/04/2024   10:22 AM 10/12/2023   10:07 AM  CMP  Glucose 65 - 99 mg/dL   87   BUN 7 - 25 mg/dL   9   Creatinine 9.38 - 1.24 mg/dL 8.49   8.77   Sodium 864 - 146 mmol/L   139   Potassium 3.5 - 5.3 mmol/L   4.2   Chloride 98 - 110 mmol/L   102   CO2 20 - 32 mmol/L   28   Calcium  8.6 - 10.3 mg/dL   9.8   Total Protein 6.0 - 8.5 g/dL  6.5  7.5   Total Bilirubin 0.0 - 1.2 mg/dL  <9.7  0.3   Alkaline Phos 47 - 123 IU/L  111    AST 0 - 40 IU/L  30  19   ALT 0 - 44 IU/L  27  21    Lipid Panel     Component Value Date/Time   CHOL 168 04/04/2024 1022   TRIG 88 04/04/2024 1022   HDL 55 04/04/2024 1022   CHOLHDL 3.1 04/04/2024 1022   CHOLHDL 4.7 08/09/2019 0922   VLDL 28 05/12/2016 0955   LDLCALC 97 04/04/2024 1022   LDLCALC  08/09/2019 0922     Comment:     . LDL cholesterol not calculated. Triglyceride levels greater than 400 mg/dL invalidate calculated LDL results. . Reference range: <100 . Desirable range <100 mg/dL for primary prevention;   <70 mg/dL for patients with CHD or diabetic patients  with > or = 2 CHD risk factors. SABRA LDL-C is now calculated using the Martin-Hopkins  calculation, which is a validated novel method providing  better accuracy than the Friedewald equation in the  estimation  of LDL-C.  Gladis APPLETHWAITE et al. SANDREA. 7986;689(80): 2061-2068  (http://education.[facility].com/faq/FAQ164)     CBC    Component Value Date/Time   WBC 7.7 10/12/2023 1007   RBC 5.06 10/12/2023 1007   HGB 15.2 10/12/2023 1007   HGB 14.6 08/30/2023 1101   HCT 45.2 10/12/2023 1007   HCT 44.6 08/30/2023 1101   PLT 224 10/12/2023 1007   PLT 245 08/30/2023 1101   MCV 89.3 10/12/2023 1007   MCV 92 08/30/2023 1101   MCH 30.0 10/12/2023 1007   MCHC 33.6 10/12/2023 1007   RDW 13.9 10/12/2023 1007   RDW 13.8 08/30/2023 1101   LYMPHSABS 2.1 06/24/2023 1211   LYMPHSABS CANCELED 03/18/2023 0000   MONOABS 1.2 (H) 06/24/2023 1211   EOSABS 0.0 06/24/2023 1211   EOSABS CANCELED 03/18/2023 0000   BASOSABS 0.0 06/24/2023 1211   BASOSABS CANCELED 03/18/2023 0000      07/05/2024   12:41 PM 05/10/2024   10:08 AM 03/08/2024   10:50 AM  Depression screen PHQ 2/9  Decreased Interest 1 1 1   Down, Depressed, Hopeless 2 1 1   PHQ - 2 Score 3 2 2   Altered sleeping 2  1  Tired, decreased energy 2  1  Change in appetite 1  0  Feeling bad or failure about yourself  3  1  Trouble concentrating 3  1  Moving slowly or fidgety/restless 1  0  Suicidal thoughts 1    PHQ-9 Score 16  6   Difficult doing work/chores Very difficult       Data saved with a previous flowsheet row definition      07/05/2024   12:40 PM 01/16/2024    3:57 PM 10/12/2023   10:07 AM 08/30/2023   10:05 AM  GAD 7 : Generalized Anxiety Score  Nervous, Anxious, on Edge 1 3 3 1   Control/stop worrying 2 3 3 1   Worry too much - different things 2 3 3 1   Trouble relaxing 2 3 2 1   Restless 1 3 2 1   Easily annoyed or irritable 3 3 3 1   Afraid - awful might happen 3  3 1   Total GAD 7 Score 14  19 7   Anxiety Difficulty Very difficult  Extremely difficult Extremely difficult      ASSESSMENT AND PLAN: 1. Prediabetes (Primary) Encouraged him to continue trying to eat healthy. - POCT glycosylated hemoglobin (Hb  A1C) - POCT glucose  (manual entry)  2.  Palpitations and association with panic attack -check TSH  3. Panic attacks Patient with elevated GAD-7 score done here today. Improved from last 2 scores. He is already plugged in with behavioral health.  He will continue his current medications including the propranolol  to use as needed. - TSH+T4F+T3Free; Future  4. Atypical chest pain Will observe for now.  Check TSH - TSH+T4F+T3Free; Future  5. Tobacco dependence Commended him on cutting back.  Continue to encourage him to try to quit.  6. Dermatitis of foot Not sure what this may be.  Appears to be hornlike lesions - Ambulatory referral to Dermatology  7. Atypical mole Looks like may be a seborrheic keratosis lesion - Ambulatory referral to Dermatology  8. Influenza vaccination declined  9. Screening for lung cancer Patient with greater than 20-pack-year history of smoking.  I recommend lung cancer screening.  He is agreeable to this. - CT CHEST LUNG CA SCREEN LOW DOSE W/O CM; Future  )    Patient was given the opportunity to ask questions.  Patient verbalized understanding of the plan and was able to repeat key elements of the plan.   This documentation was completed using Paediatric nurse.  Any transcriptional errors are unintentional.  Orders Placed This Encounter  Procedures   CT CHEST LUNG CA SCREEN LOW DOSE W/O CM   TSH+T4F+T3Free   Ambulatory referral to Dermatology   POCT glycosylated hemoglobin (Hb A1C)   POCT glucose (manual entry)     Requested Prescriptions    No prescriptions requested or ordered in this encounter    Return in about 6 months (around 01/02/2025).  Barnie Louder, MD, FACP     [1]  Current Outpatient Medications on File Prior to Visit  Medication Sig Dispense Refill   acetaminophen  (TYLENOL  8 HOUR) 650 MG CR tablet Take 1 tablet (650 mg total) by mouth every 8 (eight) hours as needed for pain. 30 tablet 1   albuterol  (VENTOLIN  HFA)  108 (90 Base) MCG/ACT inhaler Inhale 2 puffs into the lungs every 6 (six) hours as needed for wheezing or shortness of breath. 18 g 1   AMBULATORY NON FORMULARY MEDICATION Medication Name: Diltiazem  2% gel:Lidocaine  5% - using your index finger, you should apply a small amount of medication inside the rectum up to your first knuckle/joint three times daily x 6-8 weeks. 45 g 1   ARIPiprazole  (ABILIFY ) 2 MG tablet Take 1 tablet (2 mg total) by mouth daily. 90 tablet 1   aspirin  EC 81 MG tablet Take 1 tablet (81 mg total) by mouth daily. Swallow whole. 100 tablet 1   bictegravir-emtricitabine -tenofovir  AF (BIKTARVY) 50-200-25 MG TABS tablet Take 1 tablet by mouth daily. 30 tablet 11   Eluxadoline  (VIBERZI ) 100 MG TABS TAKE 1 TABLET BY MOUTH 1-2 TIMES DAILY 180 tablet 0   ezetimibe  (ZETIA ) 10 MG tablet Take 1 tablet (10 mg total) by mouth daily. 90 tablet 3   FLUoxetine  (PROZAC ) 40 MG capsule Take 2 capsules (80 mg total) by mouth daily. 180 capsule 0   lamoTRIgine  (LAMICTAL ) 25 MG tablet Take 1 tablet (25 mg total) by mouth daily. 90 tablet 1   methocarbamol  (ROBAXIN ) 500 MG tablet Take 1 tablet (500 mg total) by mouth 3 (three) times daily. 90 tablet 0   nicotine  (NICODERM CQ  - DOSED IN MG/24 HOURS) 21 mg/24hr patch Place 1 patch (21 mg total) onto the skin daily. 28 patch 1   pantoprazole  (PROTONIX )  40 MG tablet Take 1 tablet (40 mg total) by mouth daily. 30 tablet 0   propranolol  (INDERAL ) 10 MG tablet Take 1 tablet (10 mg total) by mouth 2 (two) times daily. 180 tablet 1   rosuvastatin  (CRESTOR ) 10 MG tablet Take 1 tablet (10 mg total) by mouth daily. 90 tablet 3   tadalafil  (CIALIS ) 20 MG tablet Take 1 tablet (20 mg total) by mouth daily as needed for erectile dysfunction. 20 tablet 11   traZODone  (DESYREL ) 150 MG tablet Take 1 tablet (150 mg total) by mouth at bedtime. 90 tablet 1   valACYclovir  (VALTREX ) 500 MG tablet TAKE 1 TABLET BY MOUTH TWICE DAILY 14 tablet 5   solifenacin  (VESICARE ) 5 MG  tablet Take 1 tablet (5 mg total) by mouth daily. (Patient not taking: Reported on 07/05/2024) 30 tablet 11   No current facility-administered medications on file prior to visit.  [2]  Allergies Allergen Reactions   Chantix  [Varenicline ] Other (See Comments)    Bad dreams   Oxycodone  Itching   "

## 2024-07-06 ENCOUNTER — Telehealth: Payer: Self-pay | Admitting: Internal Medicine

## 2024-07-06 NOTE — Telephone Encounter (Signed)
 Patient is calling to check on the status of these medications to be sent to the pharmacy.

## 2024-07-06 NOTE — Telephone Encounter (Unsigned)
 Copied from CRM [id number]. Topic: Clinical - Prescription Issue >> Jul 06, 2024  9:58 AM Tiffini S wrote: Reason for CRM: Patient was prescribed new medications at the last visit- states that the after visit summary shows that all medications was sent to [facility] and should go to   [facility] [phone]  [address]  [place] [address] Phone: [phone]   Patient asked for a call back to discuss  at [phone]- asked that if he do not answer to please leave a message as he is having issues with his phone

## 2024-07-06 NOTE — Telephone Encounter (Signed)
 Copied from CRM #[id number]. Topic: Clinical - Request for Lab/Test Order >> Jul 06, 2024  2:45 PM   Willma SAUNDERS wrote:  Reason for CRM: Janella from [facility] calling to advise that the CT chest scan ordered the patient doesn't qualify for. Needs an order resent for a basic CT Chest scan without contrast.  Janella can be reached at [phone] ext [phone]

## 2024-07-07 NOTE — Telephone Encounter (Signed)
 Please inquire why pt dose not qualify for low dose CT chest for lung cancer screening. He is 49 years old and has greater than 20 pack year history of smoking and continues to smoke. He meets clinical guideline criteria.

## 2024-07-09 ENCOUNTER — Telehealth: Payer: Self-pay | Admitting: Internal Medicine

## 2024-07-09 ENCOUNTER — Other Ambulatory Visit: Payer: Self-pay | Admitting: Pharmacist

## 2024-07-09 ENCOUNTER — Encounter: Payer: Self-pay | Admitting: Internal Medicine

## 2024-07-09 DIAGNOSIS — R0609 Other forms of dyspnea: Secondary | ICD-10-CM

## 2024-07-09 MED ORDER — NICOTINE 21 MG/24HR TD PT24: 21.0000 mg | MEDICATED_PATCH | Freq: Every day | TRANSDERMAL | 1 refills | Status: AC

## 2024-07-09 MED ORDER — ALBUTEROL SULFATE HFA 108 (90 BASE) MCG/ACT IN AERS: 2.0000 | INHALATION_SPRAY | Freq: Four times a day (QID) | RESPIRATORY_TRACT | 1 refills | Status: AC | PRN

## 2024-07-09 NOTE — Telephone Encounter (Signed)
 Called but no answer. LVM informing that refills prescribed by Dr.Johnson was sent to the pharmacy. Any other medications that need refills will need to be sent in by the specialist (Dr.Kapoor). Please see below for further information.

## 2024-07-09 NOTE — Telephone Encounter (Signed)
 Called & spoke to Janella from [facility]. She stated that per the patient he smoked for 34 years and no more than 10 cogarrettes a day. This equals to a 17 pack year history. Therefore he does not qualify for the CT scan per the omni calculator that they use to determine eligibility.

## 2024-07-09 NOTE — Telephone Encounter (Signed)
 Copied from CRM [id number]. Topic: Clinical - Prescription Issue >> Jul 06, 2024  9:58 AM Tiffini S wrote: Reason for CRM: Patient was prescribed new medications at the last visit- states that the after visit summary shows that all medications was sent to [facility] and should go to   [facility] [phone]  [address]  [place] [address] Phone: [phone]   Patient asked for a call back to discuss  at [phone]- asked that if he do not answer to please leave a message as he is having issues with his phone >> Jul 09, 2024 11:57 AM Hadassah PARAS wrote: Pt is following up on req. He has called [facility] and pharm is stating no orders has been sent. Pt would like this to be expedited. Please advise when complete on #[phone]

## 2024-07-09 NOTE — Telephone Encounter (Signed)
 Called but no answer. LVM informing that refills prescribed by Dr.Johnson was sent to the pharmacy. Any other medications that need refills will need to be sent in by the specialist (Dr.Kapoor). Please see alternate telephone encounter for further information.

## 2024-07-09 NOTE — Telephone Encounter (Signed)
 Received notification from pt via Mychart today that the discharge summary he received on his recent visit on 07/05/2024 does not belong to him. I called pt this evening around 6:20 p.m. I apologize for this error on behalf of myself and CMA. I kindly request that he return the summary to me that he currently has. I will have my CMA check with other pt or pts who were dischg around the same time to see if they got the right summary. Will get back to him by end of the wk.  Pt accepted my apology and was agreeable to bring in the summary that he received. Will be in this building for another appt on 07/16/2024 and will bring it then.

## 2024-07-11 ENCOUNTER — Other Ambulatory Visit: Payer: Self-pay | Admitting: Internal Medicine

## 2024-07-11 ENCOUNTER — Telehealth: Payer: Self-pay | Admitting: Internal Medicine

## 2024-07-11 ENCOUNTER — Inpatient Hospital Stay: Admission: RE | Admit: 2024-07-11 | Discharge: 2024-07-11 | Payer: MEDICAID | Attending: Neurology

## 2024-07-11 DIAGNOSIS — G3184 Mild cognitive impairment, so stated: Secondary | ICD-10-CM

## 2024-07-11 NOTE — Telephone Encounter (Signed)
 Called and left message for Janella at [facility] requesting that the LDCT for lung cancer screening be cancelled in the system.

## 2024-07-11 NOTE — Telephone Encounter (Signed)
 PC returned to Janella at [facility]. I told her based on what pt had told me on how long he had smoke and the amount, he would qualify for lung cancer screening. I have documented in my last office visit note. However, she stated when they called him to schedule, he told them he smoke no more then 10 cigarettes a day for 34 yrs. Whether there is a discrepancy, they have to go by what pt tells them. Therefore insurance will not cover for LDCT. CT without contrast can be ordered but no guarantee that insurance will cover. I called and inform pt of this. We decided to hold off on getting study done to avoid incurring out of pocket cost.

## 2024-07-12 ENCOUNTER — Ambulatory Visit: Payer: MEDICAID | Admitting: Internal Medicine

## 2024-07-13 ENCOUNTER — Encounter (HOSPITAL_BASED_OUTPATIENT_CLINIC_OR_DEPARTMENT_OTHER): Payer: Self-pay

## 2024-07-13 ENCOUNTER — Telehealth: Payer: Self-pay | Admitting: Internal Medicine

## 2024-07-13 ENCOUNTER — Telehealth: Payer: Self-pay | Admitting: Pharmacist

## 2024-07-13 ENCOUNTER — Emergency Department (HOSPITAL_BASED_OUTPATIENT_CLINIC_OR_DEPARTMENT_OTHER)
Admission: EM | Admit: 2024-07-13 | Discharge: 2024-07-13 | Disposition: A | Discharge status: Home / Self Care | Payer: MEDICAID | Attending: Emergency Medicine | Admitting: Emergency Medicine

## 2024-07-13 ENCOUNTER — Encounter: Payer: Self-pay | Admitting: Internal Medicine

## 2024-07-13 ENCOUNTER — Other Ambulatory Visit: Payer: Self-pay

## 2024-07-13 DIAGNOSIS — Z7982 Long term (current) use of aspirin: Secondary | ICD-10-CM | POA: Diagnosis not present

## 2024-07-13 DIAGNOSIS — K611 Rectal abscess: Secondary | ICD-10-CM | POA: Insufficient documentation

## 2024-07-13 DIAGNOSIS — Z21 Asymptomatic human immunodeficiency virus [HIV] infection status: Secondary | ICD-10-CM | POA: Insufficient documentation

## 2024-07-13 DIAGNOSIS — D72829 Elevated white blood cell count, unspecified: Secondary | ICD-10-CM | POA: Diagnosis not present

## 2024-07-13 DIAGNOSIS — E785 Hyperlipidemia, unspecified: Secondary | ICD-10-CM

## 2024-07-13 LAB — CBC WITH DIFFERENTIAL/PLATELET
Abs Immature Granulocytes: 0.05 K/uL (ref 0.00–0.07)
Basophils Absolute: 0 K/uL (ref 0.0–0.1)
Basophils Relative: 0 %
Eosinophils Absolute: 0.1 K/uL (ref 0.0–0.5)
Eosinophils Relative: 1 %
HCT: 44.5 % (ref 39.0–52.0)
Hemoglobin: 15.3 g/dL (ref 13.0–17.0)
Immature Granulocytes: 0 %
Lymphocytes Relative: 30 %
Lymphs Abs: 3.5 K/uL (ref 0.7–4.0)
MCH: 30.2 pg (ref 26.0–34.0)
MCHC: 34.4 g/dL (ref 30.0–36.0)
MCV: 87.8 fL (ref 80.0–100.0)
Monocytes Absolute: 0.9 K/uL (ref 0.1–1.0)
Monocytes Relative: 8 %
Neutro Abs: 7 K/uL (ref 1.7–7.7)
Neutrophils Relative %: 61 %
Platelets: 248 K/uL (ref 150–400)
RBC: 5.07 MIL/uL (ref 4.22–5.81)
RDW: 13 % (ref 11.5–15.5)
WBC: 11.5 K/uL — ABNORMAL HIGH (ref 4.0–10.5)
nRBC: 0 % (ref 0.0–0.2)

## 2024-07-13 LAB — COMPREHENSIVE METABOLIC PANEL WITH GFR
ALT: 15 U/L (ref 0–44)
AST: 25 U/L (ref 15–41)
Albumin: 4.2 g/dL (ref 3.5–5.0)
Alkaline Phosphatase: 110 U/L (ref 38–126)
Anion gap: 10 (ref 5–15)
BUN: 11 mg/dL (ref 6–20)
CO2: 25 mmol/L (ref 22–32)
Calcium: 9.5 mg/dL (ref 8.9–10.3)
Chloride: 100 mmol/L (ref 98–111)
Creatinine, Ser: 1.21 mg/dL (ref 0.61–1.24)
GFR, Estimated: >60 mL/min
Glucose, Bld: 100 mg/dL — ABNORMAL HIGH (ref 70–99)
Potassium: 4.4 mmol/L (ref 3.5–5.1)
Sodium: 135 mmol/L (ref 135–145)
Total Bilirubin: 0.3 mg/dL (ref 0.0–1.2)
Total Protein: 7.2 g/dL (ref 6.5–8.1)

## 2024-07-13 MED ORDER — DOXYCYCLINE HYCLATE 100 MG PO TABS
100.0000 mg | ORAL_TABLET | Freq: Once | ORAL | Status: AC
  Administered 2024-07-13: 100 mg via ORAL
  Filled 2024-07-13: qty 1

## 2024-07-13 MED ORDER — DOXYCYCLINE HYCLATE 100 MG PO CAPS: 100.0000 mg | ORAL_CAPSULE | Freq: Two times a day (BID) | ORAL | 0 refills | Status: AC

## 2024-07-13 NOTE — Telephone Encounter (Signed)
 PT is calling to inform us  that he has a perianal abscess developing and wants to know what he should do. Please advise.

## 2024-07-13 NOTE — Telephone Encounter (Signed)
 Call to remind for follow up lipid and LFT test - Zetia  started in Oct 2025. Patient will be going for lab on Jan 22,2026

## 2024-07-13 NOTE — ED Provider Notes (Signed)
 " [place] EMERGENCY DEPARTMENT AT [facility] Provider Note   CSN: [id number] Arrival date & time: 07/13/24  1758     Patient presents with: Abscess (Perirectal)   Shawn Brooks is a 49 y.o. male past medical history of prior abscess, HIV, dyslipidemia, prediabetes, who presents emergency department for evaluation of a perirectal abscess.  Patient reports that he began experiencing pain and sensitivity to the area superior to his rectum approximately 2 days ago.  He states he has had a history of a prior abscess in the same area, so wanted to come to the emergency department to get it drained.  He denies any fever, chills or bodyaches.   Abscess      Prior to Admission medications  Medication Sig Start Date End Date Taking? Authorizing Provider  doxycycline  (VIBRAMYCIN ) 100 MG capsule Take 1 capsule (100 mg total) by mouth 2 (two) times daily. 07/14/24  Yes Presleigh Feldstein, Marry RAMAN, PA-C  acetaminophen  (TYLENOL  8 HOUR) 650 MG CR tablet Take 1 tablet (650 mg total) by mouth every 8 (eight) hours as needed for pain. 01/02/24   Vicci Barnie NOVAK, MD  albuterol  (VENTOLIN  HFA) 108 [phone] Base) MCG/ACT inhaler Inhale 2 puffs into the lungs every 6 (six) hours as needed for wheezing or shortness of breath. 07/09/24   Vicci Barnie NOVAK, MD  AMBULATORY NON FORMULARY MEDICATION Medication Name: Diltiazem  2% gel:Lidocaine  5% - using your index finger, you should apply a small amount of medication inside the rectum up to your first knuckle/joint three times daily x 6-8 weeks. 06/17/23   Zehr, Jessica D, PA-C  ARIPiprazole  (ABILIFY ) 2 MG tablet Take 1 tablet (2 mg total) by mouth daily. 06/07/24   Kapoor, Sahil, MD  aspirin  EC 81 MG tablet Take 1 tablet (81 mg total) by mouth daily. Swallow whole. 01/02/24   Vicci Barnie NOVAK, MD  bictegravir-emtricitabine -tenofovir  AF (BIKTARVY) 50-200-25 MG TABS tablet Take 1 tablet by mouth daily. 10/12/23   Vu, Constance T, MD  Eluxadoline  (VIBERZI ) 100 MG TABS Take  one tablet once or twice daily. PLEASE SCHEDULE A FOLLOW UP APPOINTMENT FOR FURTHER REFILLS. 07/11/24   Pyrtle, Gordy CHRISTELLA, MD  ezetimibe  (ZETIA ) 10 MG tablet Take 1 tablet (10 mg total) by mouth daily. 04/05/24   Jeffrie Oneil BROCKS, MD  FLUoxetine  (PROZAC ) 40 MG capsule Take 2 capsules (80 mg total) by mouth daily. 06/07/24 09/05/24  Kapoor, Sahil, MD  lamoTRIgine  (LAMICTAL ) 25 MG tablet Take 1 tablet (25 mg total) by mouth daily. 06/07/24   Kapoor, Sahil, MD  methocarbamol  (ROBAXIN ) 500 MG tablet Take 1 tablet (500 mg total) by mouth 3 (three) times daily. 04/26/24   Williams, Megan E, NP  nicotine  (NICODERM CQ  - DOSED IN MG/24 HOURS) 21 mg/24hr patch Place 1 patch (21 mg total) onto the skin daily. 07/09/24   Vicci Barnie NOVAK, MD  pantoprazole  (PROTONIX ) 40 MG tablet Take 1 tablet (40 mg total) by mouth daily. 06/07/24   Vivienne Delon CHRISTELLA, PA-C  propranolol  (INDERAL ) 10 MG tablet Take 1 tablet (10 mg total) by mouth 2 (two) times daily. 06/07/24   Kapoor, Sahil, MD  rosuvastatin  (CRESTOR ) 10 MG tablet Take 1 tablet (10 mg total) by mouth daily. 12/05/23   Jeffrie Oneil BROCKS, MD  solifenacin  (VESICARE ) 5 MG tablet Take 1 tablet (5 mg total) by mouth daily. Patient not taking: Reported on 07/05/2024 12/16/23   Roseann Adine PARAS., MD  tadalafil  (CIALIS ) 20 MG tablet Take 1 tablet (20 mg total) by mouth daily as  needed for erectile dysfunction. 02/15/24   Stoneking, Adine PARAS., MD  traZODone  (DESYREL ) 150 MG tablet Take 1 tablet (150 mg total) by mouth at bedtime. 06/07/24   Kapoor, Sahil, MD  valACYclovir  (VALTREX ) 500 MG tablet TAKE 1 TABLET BY MOUTH TWICE DAILY 10/07/22   Vicci Barnie NOVAK, MD    Allergies: Chantix  [varenicline ] and Oxycodone     Review of Systems  Gastrointestinal:  Positive for rectal pain.    Updated Vital Signs BP 123/85   Pulse 78   Temp (!) 97.5 F (36.4 C)   Resp 20   SpO2 96%   Physical Exam Vitals and nursing note reviewed.  Constitutional:      Appearance: Normal  appearance.  HENT:     Head: Normocephalic and atraumatic.     Mouth/Throat:     Mouth: Mucous membranes are moist.  Eyes:     General: No scleral icterus.       Right eye: No discharge.        Left eye: No discharge.     Conjunctiva/sclera: Conjunctivae normal.  Cardiovascular:     Rate and Rhythm: Normal rate and regular rhythm.     Pulses: Normal pulses.  Pulmonary:     Effort: Pulmonary effort is normal.     Breath sounds: Normal breath sounds.  Abdominal:     General: There is no distension.     Tenderness: There is no abdominal tenderness.  Genitourinary:    Comments: Patient with mild tenderness to the left side superior to his rectum.  There is no obvious abscess noted.  No erythema or fluctuance. Musculoskeletal:        General: No deformity.     Cervical back: Normal range of motion.  Skin:    General: Skin is warm and dry.     Capillary Refill: Capillary refill takes less than 2 seconds.  Neurological:     Mental Status: He is alert.     Motor: No weakness.  Psychiatric:        Mood and Affect: Mood normal.     (all labs ordered are listed, but only abnormal results are displayed) Labs Reviewed  COMPREHENSIVE METABOLIC PANEL WITH GFR - Abnormal; Notable for the following components:      Result Value   Glucose, Bld 100 (*)    All other components within normal limits  CBC WITH DIFFERENTIAL/PLATELET - Abnormal; Notable for the following components:   WBC 11.5 (*)    All other components within normal limits    EKG: None  Radiology: No results found.  Procedures   Medications Ordered in the ED  doxycycline  (VIBRA -TABS) tablet 100 mg (100 mg Oral Given 07/13/24 1936)                                 Medical Decision Making Amount and/or Complexity of Data Reviewed Labs: ordered.  Risk Prescription drug management.   49 year old male who presents emergency department for evaluation of perirectal abscess.  Differential diagnosis: Abscess,  cellulitis, hemorrhoids.  Based on physical exam, there is not enough of an indurated area to drain the patient's abscess.  He does have a mild leukocytosis of 11.5, which could be reactive or secondary to possible bacteria.  Given patient's history of prior abscess as well as prediabetes, I opted to give him 10 days of antibiotics.  Patient received his first dose of doxycycline  while in the emergency department.  I  did send the rest of the patient's prescription to his pharmacy.  I informed him that he will need to take all of the medicine as prescribed, even if his symptoms improve.  Patient verbalized understanding to this.  Patient's vital signs are stable.  Patient is appropriate for discharge at this time.    Final diagnoses:  Perirectal abscess    ED Discharge Orders          Ordered    doxycycline  (VIBRAMYCIN ) 100 MG capsule  2 times daily        07/13/24 1932               Torrence Marry RAMAN, PA-C 07/13/24 2356  "

## 2024-07-13 NOTE — ED Triage Notes (Signed)
 Pt c/o perirectal abscess, first noticed approx 2 days ago first noticed the pain, now I can actually feel it. Advises hx 1 back in Nov or Dec '24, that was surgically drained, I was admitted for a few days. Requesting I&D if able.

## 2024-07-13 NOTE — Telephone Encounter (Signed)
 PC placed to pt this evening.  Advised him that I was following up with him from our conversation earlier this week in regards to him receiving somebody else's discharge summary. Informed him that I have reported the matter to our compliance department and also to my lead physician.  Internally the plan is to reeducate the staff to help prevent something like this from occurring again.  He inquired about whether we had contacted other patients who were dischg around the time that he was to see if they got their discharge summary.  I told him that I decided against doing that per recommendation from our Compliance department. I had tried to check the lock shredder bin to see if his dischg summary was in it but the company that shreds for us  had already emptied the bins a few days after his visit.

## 2024-07-13 NOTE — Telephone Encounter (Signed)
 Patient calls stating that over the last 2-3 days he has been experiencing rectal pain with pain shooting into the left buttocks and thigh. States that it has become painful to have bowel movements or even urinate. Pain is worse from sitting to standing position and he can feel a hard lump at the anus. Denies any purulent drainage though does states that there has been some blood draining at times. He denies any issues with constipation or diarrhea lately and also denies any fevers.  Patient with history of recurrent anal fissure but  has history of perirectal abscess last year with sepsis requiring hospitalization.  I advised patient that he should have his symptoms evaluated, however, with it being Friday PM, it is not very likely that we would be able to coordinate a visit that quickly. Discussed that I would send Dr Albertus a note to see if he has additional recommendations, but it is likely he will need to go to an urgent care or ER for sooner evaluation.  Please advise.[email or web address]

## 2024-07-13 NOTE — Discharge Instructions (Signed)
 It was a pleasure taking care of you today. You were seen in the Emergency Department for evaluation of a perirectal abscess. Your work-up was reassuring.  The area of pain, is not large enough to require an incision and drainage.  Therefore, I am prescribing you with 10 days of antibiotics to treat the abscess.  You did receive your first dose of doxycycline  while in the emergency department.  You will begin the rest of the antibiotics tomorrow.  You may also obtain a sitz bath from your local drugstore as we do not have them here.  Refer to the attached documentation for further management of your symptoms. Follow up with your PCP if your symptoms continue.  Please return to the ER if you experience chest pain, trouble breathing, intractable nausea/vomiting or any other life threatening illnesses.

## 2024-07-13 NOTE — Telephone Encounter (Signed)
 Al[place], due to patient's prior hospital admission 05/2023 with sepsis secondary to a perirectal abscess, I recommend for the patient to go to the ED, not urgent care, to be examined per ED provider and to obtain labs and to consider pelvic CT. If needed, ED physician can contact general surgery for ED evaluation if patient has a perirectal abscess that needs drainage.

## 2024-07-13 NOTE — Telephone Encounter (Signed)
 Patient is advised to go to the emergency room for expedited evaluation and for possible labs/imaging/general surgery consult if appropriate. Patient verbalizes understanding of this information and indicates he will go to the ER.

## 2024-07-16 ENCOUNTER — Ambulatory Visit: Payer: MEDICAID | Admitting: Internal Medicine

## 2024-07-16 ENCOUNTER — Telehealth: Payer: Self-pay | Admitting: Internal Medicine

## 2024-07-16 ENCOUNTER — Ambulatory Visit: Payer: MEDICAID | Attending: Family Medicine

## 2024-07-16 ENCOUNTER — Other Ambulatory Visit (HOSPITAL_COMMUNITY)
Admission: RE | Admit: 2024-07-16 | Discharge: 2024-07-16 | Disposition: A | Discharge status: Home / Self Care | Payer: MEDICAID | Source: Ambulatory Visit | Attending: Internal Medicine | Admitting: Internal Medicine

## 2024-07-16 ENCOUNTER — Other Ambulatory Visit: Payer: Self-pay

## 2024-07-16 VITALS — BP 114/68 | HR 71 | Temp 99.4°F | Ht 69.0 in | Wt 222.4 lb

## 2024-07-16 DIAGNOSIS — Z113 Encounter for screening for infections with a predominantly sexual mode of transmission: Secondary | ICD-10-CM | POA: Insufficient documentation

## 2024-07-16 DIAGNOSIS — F410 Panic disorder [episodic paroxysmal anxiety] without agoraphobia: Secondary | ICD-10-CM

## 2024-07-16 DIAGNOSIS — R21 Rash and other nonspecific skin eruption: Secondary | ICD-10-CM | POA: Diagnosis not present

## 2024-07-16 DIAGNOSIS — K611 Rectal abscess: Secondary | ICD-10-CM

## 2024-07-16 DIAGNOSIS — B20 Human immunodeficiency virus [HIV] disease: Secondary | ICD-10-CM | POA: Diagnosis not present

## 2024-07-16 DIAGNOSIS — R0789 Other chest pain: Secondary | ICD-10-CM

## 2024-07-16 MED ORDER — BICTEGRAVIR-EMTRICITAB-TENOFOV 50-200-25 MG PO TABS: 1.0000 | ORAL_TABLET | Freq: Every day | ORAL | 11 refills | Status: AC

## 2024-07-16 MED ORDER — METHOCARBAMOL 500 MG PO TABS: 500.0000 mg | ORAL_TABLET | Freq: Three times a day (TID) | ORAL | 2 refills | Status: AC

## 2024-07-16 NOTE — Patient Instructions (Signed)
 F/u dermatology for rash. Nothing concerning from my exam today    Continue biktarvy  Std screen  See me around December this year   Any std screening visit can be done with our pharmacy team as well   Labs today

## 2024-07-16 NOTE — Progress Notes (Signed)
 "  Subjective:    Patient ID: Shawn Brooks, male    DOB: January 15, 1976, 49 y.o.   MRN: [id number]  HPI Tommy is here for follow up of HIV He continues on Genvoya  and Prezista  with no missed doses.  No issus with getting or taking his medication.  He continues in care withmental health.  No complaints today.   10/12/23 id clinic visit; first with dr Overton See a&p Reviewed chart   12/28/23 id clinic f/u Recent syphilis dx and treated by DIS with 2.4 mil units bicillin  once. His partner was tested positive so just advised to get tx. No test yet  See a&p    07/16/24 id clinic f/u Compliant with biktarvy; transitioned to it from genvoya /prezista  09/2023 No missed dose last 4 weeks Viral load looks great 12/2023 Wants std screening today Has burning in urine a week - went to er a few days ago. Also noted to have perirectal abscess doxycycline  which he thinks feels better. Had pain perirectal with it and improving. Burning with urination is gone   He has skin rash for a year that is growing on left lateral side of foot and one on face. No symptoms     Review of Systems  Constitutional:  Negative for fatigue.  Gastrointestinal:  Negative for diarrhea and nausea.  Skin:  Negative for rash.       Objective:    There were no vitals filed for this visit.  Body mass index is 32.84 kg/m. General/constitutional: no distress, pleasant HEENT: Normocephalic, PER, Conj Clear, EOMI, Oropharynx clear Neck supple CV: rrr no mrg Lungs: clear to auscultation, normal respiratory effort Abd: Soft, Nontender Ext: no edema Skin: right face 3 mm well demarcated flat plaque c/w seborheic dermatosis; the left lateral foot 2 lesions veruceous in texture likely benign wart Neuro: nonfocal MSK: no peripheral joint swelling/tenderness/warmth; back spines nontender  Labs: Lab Results  Component Value Date   WBC 11.5 (H) 07/13/2024   HGB 15.3 07/13/2024   HCT 44.5 07/13/2024   MCV 87.8 07/13/2024    PLT 248 07/13/2024   Last metabolic panel Lab Results  Component Value Date   GLUCOSE 100 (H) 07/13/2024   NA 135 07/13/2024   K 4.4 07/13/2024   CL 100 07/13/2024   CO2 25 07/13/2024   BUN 11 07/13/2024   CREATININE 1.21 07/13/2024   GFRNONAA >60 07/13/2024   CALCIUM  9.5 07/13/2024   PHOS 3.0 05/02/2017   PROT 7.2 07/13/2024   ALBUMIN 4.2 07/13/2024   LABGLOB 3.0 03/18/2023   AGRATIO 1.9 06/11/2021   BILITOT 0.3 07/13/2024   ALKPHOS 110 07/13/2024   AST 25 07/13/2024   ALT 15 07/13/2024   ANIONGAP 10 07/13/2024   HIV: 09/2023                  ud     02/2023                  ud       /      1073 (32%)    HIV Genotype: 04/2010  HIV 1 Genotyping, DNA Seq                              See Comment      DRUG RESISTANCE MUTATION ANALYSIS      RESISTANCE INTERPRETATION      -----------------------------------  ------------------------------      abacavir (ABC)  No Evidence of Resistance      didanosine (ddl)                       No Evidence of Resistance      lamivudine(3TC)/emtricitabine (FTC)     No Evidence of Resistance      stavudine (d4T)                        No Evidence of Resistance      tenofovir  (TDF)                        No Evidence of Resistance      zidovudine (AZT)                       No Evidence of Resistance      efavirenz (EFV)                        No Evidence of Resistance      etravirine (ETR)                       No Evidence of Resistance      nevirapine (NVP)                       No Evidence of Resistance      atazanavir (ATV)                       No Evidence of Resistance        ATV/r**                              No Evidence of Resistance      darunavir  + ritonavir (DRV/r)           No Evidence of Resistance      fosamprenavir (FPV)                    No Evidence of Resistance        FPV/r**                              No Evidence of Resistance      indinavir (IDV)                        No Evidence of Resistance         IDV/r**                              No Evidence of Resistance      lopinavir + ritonavir  (LPV/r)          No Evidence of Resistance      nelfinavir (NFV)                       No Evidence of Resistance      saquinavir + ritonavir  (SQV/r)         No Evidence of Resistance      tipranavir + ritonavir  (TPV/r)         No Evidence of Resistance            **  Protease Inhibitors administered with low-dose ritonavir  for      pharmacological boosting.  ! Resistance Associated RT Mutations:                              See Comment      No Relevant Mutations Detected  ! 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 There as discussion previously with dr Efrain who was considering this given no prior resistance known and patient had started treatment after dx and has been well controlled  Patient has medicaid  Recommended starting biktarvy   12/28/23 3 month f/u since transitioning to biktarvy from genvoya /prezista    -discussed u=u -encourage compliance -continue current HIV medication until he picks up biktarvy then start that once a day -labs today 07/16/24 -f/u 11 months    #hx syphilis Titer nonreactive --> 1:1 and treated in 2020 Titer has been nonreactive since 2020; last titer 02/2023 nonreactive  12/28/23 was exposed and notified by DIS 12/26/23.  12/2023 id clinic visit bicillin  given  -rpr/std screen today 07/16/24 -burning in urine the past week as of 07/16/24 visit ?std. Doxy resolved sx   #perirectal abscess Early visit urgent care 06/2024 no lancing needed; resolving with doxycycline   -f/u as needed   #skin rash SK on  face ?wart on foot  -consider dermatology visit and pcp f/u -pcp had given referral to derm   #Other medical problems: -recurrent hsv -hlp -htn -copd -anxiety/depression    #concern about memory No sign of NPH Occasional etoh use only Hx syphilis no prior neurosyphilis dx No numbness/tingling/weakness No dx of sleep apnea (sleep study done early 2025) No hx TBI No family hx early onset dementia  Does have hx depression -- on mirtazepine; doesn't see psychiatry  Describe both short term and long term memory. Doesn't know to get around in [place] like he used to or remember what to do with his jobs  Duration 2-3 years as of 09/2023   -trying to get on disability -haven't spoken to pcp about this as he can't seem to remember -will forward chart to PCP to follow this up -I do not suspect related to his well treated HIV -neurology referral    #muscle ache Lower back bilateral shoulders more achy the last few weeks. Chronic lower back pain No fever, chill  -prn tylenol  -robaxin  10 days trial    #hcm -vaccination Tdap 2019 Prevnar20 @ 2023 Meningococcal booster 2020 Shingrix  first shot 08/2023; 2nd shot today 12/28/2023 -hepatitis 10/12/23 hep b sAb <5; cAb and sAg nonreactive 09/2023 hep c Ab nonreactive -std screening 09/2023 rpr nonreactive; urine gc/chlam negative 12/28/23 rpr and triple screen today -tb screening 12/28/23 quantiferon gold ordered -cancer screening Defer to pcp for age appropriate cancer screening Colonoscopy 06/2023 normal per patient's report Anal pap 12/28/23 normal    #social -born/raised [place]  -12/2023 just got in a new house -in a steady relationship for 20 years used to be open relationship: 12/28/23 still  same relationship. Kind of open.  -travel -- [place], [place], [place], [place] , [place]; nothing outside of us  -marijuanna -tobacco -- 35 years 1 pack every 2 days -no hx ivdu or other street drug use -not  currently working 10/12/23 due to memory  "

## 2024-07-16 NOTE — Telephone Encounter (Signed)
 Please call pt this a.m and remind him to bring the dischg summary back for us  when he is coming to ID appt this morning.

## 2024-07-16 NOTE — Telephone Encounter (Signed)
 Phone call placed to patient this evening.  I thanked him for bringing the discharge summary back that he had received.  I informed patient that the top 6 pages were discharge summary for the other patient and then his summary was also attached that were 7 pages and all.  So CMA can pick that up off the printer without realizing that there were 2 different summaries.  This explains where his discharge summary was.  Patient thanked me for calling.  He requested refill on Robaxin .

## 2024-07-16 NOTE — Telephone Encounter (Signed)
 Called & spoke to the patient. Verified name & DOB. Instructed to bring in the discharge summary back to our office when he arrives for his appointment with Infectious Disease. Patient stated that he will try his best. His phone is currently not working well and is unable to see if transportation has called him for pick up to his appointment. Patient stated that if he is able to make it to his appointment he will bring in the summary. Instructed patient to let us  know if there are any changed. Patient expressed verbal understanding of all discussed.

## 2024-07-16 NOTE — Addendum Note (Signed)
 Addended by: CELESTIA LELA HERO on: 07/16/2024 11:50 AM   Modules accepted: Orders

## 2024-07-17 ENCOUNTER — Ambulatory Visit: Payer: Self-pay | Admitting: Internal Medicine

## 2024-07-17 LAB — CYTOLOGY, (ORAL, ANAL, URETHRAL) ANCILLARY ONLY
Chlamydia: NEGATIVE
Comment: NEGATIVE
Comment: NORMAL
Neisseria Gonorrhea: NEGATIVE

## 2024-07-17 LAB — TSH+T4F+T3FREE
Free T4: 1.12 ng/dL (ref 0.82–1.77)
T3, Free: 2.9 pg/mL (ref 2.0–4.4)
TSH: 0.897 u[IU]/mL (ref 0.450–4.500)

## 2024-07-17 LAB — URINE CYTOLOGY ANCILLARY ONLY
Chlamydia: NEGATIVE
Comment: NEGATIVE
Comment: NEGATIVE
Comment: NORMAL
Neisseria Gonorrhea: NEGATIVE
Trichomonas: NEGATIVE

## 2024-07-18 ENCOUNTER — Other Ambulatory Visit: Payer: Self-pay | Admitting: Internal Medicine

## 2024-07-18 ENCOUNTER — Encounter: Payer: Self-pay | Admitting: Internal Medicine

## 2024-07-18 DIAGNOSIS — M545 Low back pain, unspecified: Secondary | ICD-10-CM

## 2024-07-18 LAB — HIV-1 RNA QUANT-NO REFLEX-BLD
HIV 1 RNA Quant: NOT DETECTED {copies}/mL
HIV-1 RNA Quant, Log: NOT DETECTED {Log_copies}/mL

## 2024-07-18 LAB — SYPHILIS: RPR W/REFLEX TO RPR TITER AND TREPONEMAL ANTIBODIES, TRADITIONAL SCREENING AND DIAGNOSIS ALGORITHM: RPR Ser Ql: NONREACTIVE

## 2024-07-19 ENCOUNTER — Inpatient Hospital Stay: Admission: RE | Admit: 2024-07-19 | Payer: MEDICAID | Source: Ambulatory Visit

## 2024-07-23 ENCOUNTER — Other Ambulatory Visit: Payer: Self-pay | Admitting: Internal Medicine

## 2024-07-23 DIAGNOSIS — M5416 Radiculopathy, lumbar region: Secondary | ICD-10-CM

## 2024-07-30 ENCOUNTER — Ambulatory Visit: Payer: MEDICAID

## 2024-07-30 ENCOUNTER — Other Ambulatory Visit (HOSPITAL_COMMUNITY): Payer: Self-pay

## 2024-07-30 ENCOUNTER — Telehealth (HOSPITAL_COMMUNITY): Payer: Self-pay

## 2024-07-30 DIAGNOSIS — F4010 Social phobia, unspecified: Secondary | ICD-10-CM

## 2024-07-30 DIAGNOSIS — F3341 Major depressive disorder, recurrent, in partial remission: Secondary | ICD-10-CM

## 2024-07-30 MED ORDER — PROPRANOLOL HCL 10 MG PO TABS: 10.0000 mg | ORAL_TABLET | Freq: Three times a day (TID) | ORAL | 0 refills | Status: AC

## 2024-07-30 NOTE — Telephone Encounter (Signed)
 Pt called stating his Propranolol  10 MG isnt working anymore and needs a higher dosage because she continues to have panic attacks please call pt or send in new RX

## 2024-08-01 NOTE — Therapy (Signed)
 " OUTPATIENT PHYSICAL THERAPY THORACOLUMBAR EVALUATION   Patient Name: Shawn Brooks MRN: [id number] DOB:[birth year], 49 y.o., male Today's Date: 08/02/2024  END OF SESSION:  PT End of Session - 08/02/24 1307     Visit Number 1    Number of Visits 12    Date for Recertification  09/21/24    Authorization Type [employer]    Authorization - Visit Number 1    Authorization - Number of Visits 12    PT Start Time [phone]    PT Stop Time 0940    PT Time Calculation (min) 50 min    Activity Tolerance Patient tolerated treatment well;Patient limited by pain    Behavior During Therapy WFL for tasks assessed/performed          Past Medical History:  Diagnosis Date   Anal condyloma    Anxiety    Blood dyscrasia    HIV   Chronic back pain    Constipation    takes miralax  daily   Depression    on meds   DJD (degenerative joint disease)    Gastric ulcer    GERD (gastroesophageal reflux disease)    Headache(784.0)    Hiatal hernia    HIV infection (HCC)    on meds   Hyperplastic colon polyp    Hypertension    on meds   Internal hemorrhoids    Sickle cell anemia (HCC)    Past Surgical History:  Procedure Laterality Date   COLONOSCOPY  2019   JMP-MAC-prep good-polyps+   ESOPHAGOGASTRODUODENOSCOPY  03/27/2012   Procedure: ESOPHAGOGASTRODUODENOSCOPY (EGD);  Surgeon: Jerrell KYM Sol, MD;  Location: [facility];  Service: Endoscopy;  Laterality: N/A;   INCISION AND DRAINAGE PERIRECTAL ABSCESS N/A 06/25/2023   Procedure: IRRIGATION AND DEBRIDEMENT PERIRECTAL ABSCESS;  Surgeon: Paola Dreama SAILOR, MD;  Location: [facility];  Service: General;  Laterality: N/A;   IRRIGATION AND DEBRIDEMENT ABSCESS N/A 03/13/2015   Procedure: IRRIGATION AND DEBRIDEMENT ABSCESS;  Surgeon: Donnice Lunger, MD;  Location: [facility];  Service: General;  Laterality: N/A;   WISDOM TOOTH EXTRACTION     Patient Active Problem List   Diagnosis Date Noted   BPH with lower urinary tract symptoms without  urinary obstruction 02/15/2024   Chronic pain of both knees 01/24/2024   Cannabis use disorder, moderate, dependence (HCC) 10/13/2023   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   Perianal abscess 06/24/2023   Acute urinary retention 06/24/2023   AKI (acute kidney injury) 06/24/2023   Anal fissure 06/17/2023   Folate deficiency 04/08/2023   Obstructive sleep apnea 03/10/2023   Severe episode of recurrent major depressive disorder, with psychotic features (HCC) 02/24/2023   Social anxiety disorder 02/24/2023   Class 2 severe obesity due to excess calories with serious comorbidity and body mass index (BMI) of 35.0 to 35.9 in adult 10/07/2022   Influenza vaccine refused 06/17/2020   Esophageal dysphagia 06/17/2020   Mixed hyperlipidemia 06/17/2020   Obesity, Class I, BMI 30-34.9 06/17/2020   History of farsightedness 06/17/2020   Chronic night sweats 04/10/2020   Counseled about COVID-19 virus infection 09/18/2019   Urinary frequency 12/26/2018   Medication monitoring encounter 08/08/2018   Memory problem 09/01/2017   Vitamin D  deficiency 09/01/2017   Primary osteoarthritis of left knee 05/02/2017   Prediabetes 12/22/2016   Tobacco abuse 05/31/2016   Lumbar transverse process fracture (HCC) 10/02/2015   Screening examination for sexually transmitted disease 04/04/2014   Thoracic or lumbosacral neuritis or radiculitis 01/15/2013   H/O gastrointestinal disease  01/15/2013   Lumbar radiculopathy 01/15/2013   Carcinoma in situ of anal canal 11/17/2012   Condyloma acuminatum 11/17/2012   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   AGW (anogenital warts) 11/17/2012   Hemorrhoid 09/27/2012   BRBPR (bright red blood per rectum) 07/27/2012   Prostatitis 04/12/2012   Constipation 03/09/2012   Callus 08/16/2011   Ingrown toenail 08/16/2011   Chronic low back pain 08/16/2011   Dyslipidemia 09/03/2010   Herpes simplex virus (HSV) infection 07/06/2010   Organic impotence  07/06/2010   BURSITIS, KNEE 05/06/2010   Human immunodeficiency virus (HIV) disease (HCC) 04/16/2010    PCP: Vicci Barnie NOVAK, MD   REFERRING PROVIDER: Vicci Barnie NOVAK, MD   REFERRING DIAG: M54.16 (ICD-10-CM) - Lumbar radiculopathy   Rationale for Evaluation and Treatment: Rehabilitation  THERAPY DIAG:  Other low back pain  Unspecified soft tissue disorder related to use, overuse and pressure multiple sites  ONSET DATE: Chronic: approx 25 years  SUBJECTIVE:                                                                                                                                                                                           SUBJECTIVE STATEMENT: Pt reports a chronic Hx of low back pain which started after a fall approx 25 years ago. At that time, he reports being Dxed c lumbar DDD and a pinched nerve. He notes 8 years ago, with another fall, fracturing a thoracic vertebrae and 3 ribs.  PERTINENT HISTORY:  Cigarette use; high BMI; chronic LBP; Chronic knee pain; HIV, Anxiety, Depression, DJD, Sickle cell anemia, Memory problem  PAIN:  Are you having pain? Yes: NPRS scale: Low back: current: 5/10; pain range: 4-10/10. Shoulder blades: current: 7/10; pain range: 1-10/10. Pain location: Low back, L>R and shoulder blades Pain description: Constant; pain and hard squeeze around low back spine; spasms for shoulder blades Aggravating factors: Pt states he limits all activities which increase his pain Relieving factors: Lying down, ice packs, heat pad, icy hot, pain/spasm meds  PRECAUTIONS: None  RED FLAGS: None   WEIGHT BEARING RESTRICTIONS: No  FALLS:  Has patient fallen in last 6 months? Yes. Number of falls 1 Fell in recent snowy and icy weather s significant increase in pain Hx of falls over the past 25 year. In the last year: 1 due to R knee giving out and 1 due to a misstep on a step  LIVING ENVIRONMENT: Lives with: lives with their family Lives in:  House/apartment Stairs: Yes: External: 2 steps; can reach both Has following equipment at home: Quad cane large base; back brace  OCCUPATION: Flied for diability  PLOF: Independent with household mobility with device and Independent with community mobility with device  PATIENT GOALS: For the pain to be reduced as much as possible  NEXT MD VISIT: 09/04/24  OBJECTIVE:  Note: Objective measures were completed at Evaluation unless otherwise noted.  DIAGNOSTIC FINDINGS:  01/17/24 DG Lumbar Spine FINDINGS: There is no evidence of lumbar spine fracture. Alignment is normal. Intervertebral disc spaces are maintained.   IMPRESSION: No fracture or dislocation of the lumbar spine. Intervertebral disc spaces are maintained.  07/25/2017 DG Lumbar spine FINDINGS: Five lumbar-type vertebral bodies.   Normal lumbar lordosis.   No evidence of fracture or dislocation. Vertebral body heights and intervertebral disc spaces are maintained.   Visualized bony pelvis appears intact.  12/22/16 DG Thoracic spine IMPRESSION: Negative.  FINDINGS: Evaluation of the superior aspect of the thoracic spine as well as the cervicothoracic junction is degraded secondary overlying osseous and soft tissue structures.   Normal alignment of the thoracic spine. No anterolisthesis or retrolisthesis.   Thoracic vertebral body heights appear preserved.   Thoracic intervertebral disc space heights appear preserved   Limited visualization adjacent thorax is normal. Regional soft tissues appear normal.   IMPRESSION: Unremarkable radiographs of the thoracic spine.  PATIENT SURVEYS:  Modified Oswestry: 24/50=48%  Interpretation of scores: Score Category Description  0-20% Minimal Disability The patient can cope with most living activities. Usually no treatment is indicated apart from advice on lifting, sitting and exercise  21-40% Moderate Disability The patient experiences more pain and difficulty with  sitting, lifting and standing. Travel and social life are more difficult and they may be disabled from work. Personal care, sexual activity and sleeping are not grossly affected, and the patient can usually be managed by conservative means  41-60% Severe Disability Pain remains the main problem in this group, but activities of daily living are affected. These patients require a detailed investigation  61-80% Crippled Back pain impinges on all aspects of the patients life. Positive intervention is required  81-100% Bed-bound These patients are either bed-bound or exaggerating their symptoms   Minimally Clinically Important Difference (MCID) = 12.8%  COGNITION: Overall cognitive status: Within functional limits for tasks assessed     SENSATION: Light touch: Impaired  posterior buttock and leg sometimes to the foot  MUSCLE LENGTH: Hamstrings: Right Tight vs pos SLR?; Left Tight vs pos SLR? Thomas test: Right NT deg; Left NT deg  POSTURE: rounded shoulders, forward head, and weight shift right for decreased wt bearing L LE  PALPATION: TTP of the bilat lumbar paraspinals and interscapular muscles  LUMBAR ROM:   AROM eval  Flexion 50% limited; increased LBP c pulling  Extension 50% limited; increased LBP c pressure  Right lateral flexion 50% limited; L LBP  Left lateral flexion 75% limited; L LBP pressure  Right rotation 25% limited; R scapula spasm  Left rotation 50% limited; L scapula spasm   (Blank rows = not tested)  LOWER EXTREMITY ROM:    WFLs for sit to/from standing and sit t/f lying supine Active  Right eval Left eval  Hip flexion    Hip extension    Hip abduction    Hip adduction    Hip internal rotation    Hip external rotation    Knee flexion    Knee extension    Ankle dorsiflexion    Ankle plantarflexion    Ankle inversion    Ankle eversion     (Blank rows = not tested)  LOWER EXTREMITY MMT:  Myotome  screen neg. LE strength testing grossly 3-4/5 and limited  by increase in report of pain  MMT Right eval Left eval  Hip flexion    Hip extension    Hip abduction    Hip adduction    Hip internal rotation    Hip external rotation    Knee flexion    Knee extension    Ankle dorsiflexion    Ankle plantarflexion    Ankle inversion    Ankle eversion     (Blank rows = not tested)  LUMBAR SPECIAL TESTS:  SLR bilat positive vs tight hamstring  FUNCTIONAL TESTS:  5 times sit to stand: TBA  GAIT: Distance walked: 200' Assistive device utilized: Chiropractor Level of assistance: Complete Independence Comments: decreased pace  TREATMENT DATE:  [facility] Adult PT Treatment:                                                DATE: 08/02/24 Therapeutic Exercise: Developed, instructed in, and pt completed therex as noted in HEP  Pt reports not being able to complete his last HEP due to his difficulty in getting down/up from the floor and the difficulty in completing the exs on his bed Self Care: Eval findings and purpose of PT                                                                                                                           PATIENT EDUCATION:  Education details: Eval findings, POC, HEP, self care Person educated: Patient Education method: Explanation, Demonstration, Tactile cues, Verbal cues, and Handouts Education comprehension: verbalized understanding, returned demonstration, verbal cues required, and tactile cues required  HOME EXERCISE PROGRAM: Access Code: [email or web address] URL: [email or web address] Date: 08/02/2024 Prepared by: Dasie Daft  Exercises - Seated Sciatic Nerve Glide With Cervical Motion  - 1 x daily - 7 x weekly - 1 sets - 10 reps - 1 hold (Instructed to complete s cervical motion) - Standing Shoulder Row with Anchored Resistance  - 1 x daily - 7 x weekly - 1 sets - 10 reps - 3 hold - Shoulder extension with resistance - Neutral  - 1 x daily - 7 x weekly - 1 sets - 10 reps - 3  hold  ASSESSMENT:  CLINICAL IMPRESSION: Patient is a 49 y.o. male who was seen today for physical therapy evaluation and treatment for M54.16 (ICD-10-CM) - Lumbar radiculopathy. Pt presents with a chronic Hx of back pain with deceased sensation of the posterior L LE. Deficits include mod to marked decreased trunk ROM,  decreased lower extremity strength/use limited by pain, use of a LBQC for balance assist with ambulation, questionably positive SLR vs tight hamstring, bilat, and a self rated functional ability of 48% indicating severe disability. Pt reports he manages his back pain by limiting his completion of activities  which aggravate the pain. Pt was started on an initial HEP to address lumbopelvic flexibility and strength. Pt will benefit from skilled PT 2w6 to address impairments to optimize back function with less pain.   OBJECTIVE IMPAIRMENTS: decreased activity tolerance, decreased balance, difficulty walking, decreased ROM, decreased strength, increased muscle spasms, impaired sensation, pain, and high BMI.   ACTIVITY LIMITATIONS: carrying, lifting, bending, sitting, standing, squatting, sleeping, stairs, transfers, bed mobility, bathing, toileting, dressing, reach over head, hygiene/grooming, locomotion level, and caring for others  PARTICIPATION LIMITATIONS: meal prep, cleaning, and laundry  PERSONAL FACTORS: Cigarette use; high BMI; chronic LBP; Chronic knee pain; HIV, Anxiety, Depression, DJD, Sickle cell anemia, Memory problem are also affecting patient's functional outcome.   REHAB POTENTIAL: Fair chronicity of issue  CLINICAL DECISION MAKING: Evolving/moderate complexity  EVALUATION COMPLEXITY: Moderate   GOALS:  SHORT TERM GOALS: Target date: 08/24/24  Pt will be Ind in an initial HEP Baseline: started Goal status: INITIAL  2.  Pt will report 25% of greater improvement of back pain for improved function and QOL. Baseline: low back 4-10/10; bilat scapula 1-10/10 Goal  status: INITIAL  LONG TERM GOALS: Target date: 09/21/24  Pt will be Ind in a final HEP to maintain achieved LOF  Baseline: started Goal status: INITIAL  2.  Pt will report 50% of greater improvement of back pain for improved function and QOL. Baseline:  low back 4-10/10; bilat scapula 1-10/10 Goal status: INITIAL  3.  Improve 5xSTS by MCID of 5 as indication of improved functional mobility  Baseline: TBA Goal status: INITIAL  4.  Pt will demonstrate improved trunk mobility by 25% for improved function and as indication of improved back pain  Baseline: see flow sheet Goal status: INITIAL  5.  Pt will demonstrate improved understanding of back care principles and proper body for better tolerance of household activities Baseline:  Goal status: INITIAL  6.  Pt's Mod Oswestry score will improve by the MCID to 35% or better as indication of improved function  Baseline: 48% Goal status: INITIAL  PLAN:  PT FREQUENCY: 2x/week  PT DURATION: 6 weeks  PLANNED INTERVENTIONS: 97164- PT Re-evaluation, 97110-Therapeutic exercises, 97530- Therapeutic activity, 97112- Neuromuscular re-education, 97535- Self Care, [id number]- Manual therapy, [phone]- Gait training, [phone]- Aquatic Therapy, [phone]- Electrical stimulation (unattended), Patient/Family education, Balance training, Stair training, Taping, Joint mobilization, Spinal mobilization, Cryotherapy, and Moist heat.  PLAN FOR NEXT SESSION: Assess 5xSTS, Assess response to HEP; progress therex as indicated; use of modalities, manual therapy; and TPDN as indicated.   Sharissa Brierley MS, PT 08/02/24 5:05 PM  For all possible CPT codes, reference the Planned Interventions line above.     Check all conditions that are expected to impact treatment: {Conditions expected to impact treatment:Morbid obesity, Musculoskeletal disorders, and Social determinants of health   If treatment provided at initial evaluation, no treatment charged due to lack of  authorization.       "

## 2024-08-02 ENCOUNTER — Ambulatory Visit: Payer: MEDICAID

## 2024-08-02 ENCOUNTER — Other Ambulatory Visit: Payer: Self-pay

## 2024-08-02 DIAGNOSIS — M7099 Unspecified soft tissue disorder related to use, overuse and pressure multiple sites: Secondary | ICD-10-CM

## 2024-08-02 DIAGNOSIS — M5459 Other low back pain: Secondary | ICD-10-CM

## 2024-08-03 ENCOUNTER — Ambulatory Visit: Payer: Self-pay | Admitting: Internal Medicine

## 2024-08-08 ENCOUNTER — Ambulatory Visit: Payer: MEDICAID | Admitting: Physical Therapy

## 2024-08-10 ENCOUNTER — Ambulatory Visit: Payer: MEDICAID | Admitting: Physical Therapy

## 2024-08-15 ENCOUNTER — Ambulatory Visit: Payer: MEDICAID | Admitting: Physical Therapy

## 2024-08-16 ENCOUNTER — Ambulatory Visit: Payer: MEDICAID | Admitting: Urology

## 2024-08-17 ENCOUNTER — Ambulatory Visit: Payer: MEDICAID | Admitting: Physical Therapy

## 2024-08-20 ENCOUNTER — Ambulatory Visit: Payer: MEDICAID | Admitting: Urology

## 2024-08-22 ENCOUNTER — Ambulatory Visit: Payer: MEDICAID | Admitting: Physical Therapy

## 2024-08-24 ENCOUNTER — Ambulatory Visit: Payer: MEDICAID | Admitting: Physical Therapy

## 2024-08-30 ENCOUNTER — Telehealth (HOSPITAL_COMMUNITY): Payer: MEDICAID

## 2024-09-04 ENCOUNTER — Ambulatory Visit: Payer: Self-pay | Admitting: Internal Medicine

## 2025-06-18 ENCOUNTER — Ambulatory Visit: Payer: Self-pay | Admitting: Internal Medicine
